# Patient Record
Sex: Male | Born: 1965
Health system: Southern US, Community
[De-identification: ages and names within clinical notes are randomized; demographics above are authoritative.]

## PROBLEM LIST (undated history)

## (undated) DIAGNOSIS — F32A Depression, unspecified: Secondary | ICD-10-CM

## (undated) DIAGNOSIS — T7840XA Allergy, unspecified, initial encounter: Secondary | ICD-10-CM

## (undated) DIAGNOSIS — F329 Major depressive disorder, single episode, unspecified: Secondary | ICD-10-CM

## (undated) DIAGNOSIS — R3129 Other microscopic hematuria: Secondary | ICD-10-CM

## (undated) DIAGNOSIS — K219 Gastro-esophageal reflux disease without esophagitis: Secondary | ICD-10-CM

## (undated) DIAGNOSIS — K264 Chronic or unspecified duodenal ulcer with hemorrhage: Secondary | ICD-10-CM

## (undated) DIAGNOSIS — F419 Anxiety disorder, unspecified: Secondary | ICD-10-CM

## (undated) DIAGNOSIS — E785 Hyperlipidemia, unspecified: Secondary | ICD-10-CM

## (undated) DIAGNOSIS — M542 Cervicalgia: Secondary | ICD-10-CM

## (undated) DIAGNOSIS — I10 Essential (primary) hypertension: Secondary | ICD-10-CM

## (undated) DIAGNOSIS — G8929 Other chronic pain: Secondary | ICD-10-CM

## (undated) HISTORY — DX: Other microscopic hematuria: R31.29

## (undated) HISTORY — DX: Depression, unspecified: F32.A

## (undated) HISTORY — DX: Cervicalgia: M54.2

## (undated) HISTORY — DX: Anxiety disorder, unspecified: F41.9

## (undated) HISTORY — DX: Major depressive disorder, single episode, unspecified: F32.9

## (undated) HISTORY — DX: Other chronic pain: G89.29

## (undated) HISTORY — PX: FLEXIBLE SIGMOIDOSCOPY: SHX1649

## (undated) HISTORY — DX: Allergy, unspecified, initial encounter: T78.40XA

## (undated) HISTORY — DX: Chronic or unspecified duodenal ulcer with hemorrhage: K26.4

## (undated) HISTORY — DX: Hyperlipidemia, unspecified: E78.5

## (undated) HISTORY — DX: Essential (primary) hypertension: I10

## (undated) HISTORY — PX: OTHER SURGICAL HISTORY: SHX169

---

## 2010-11-01 DIAGNOSIS — K264 Chronic or unspecified duodenal ulcer with hemorrhage: Secondary | ICD-10-CM

## 2010-11-01 HISTORY — PX: OTHER SURGICAL HISTORY: SHX169

## 2010-11-01 HISTORY — DX: Chronic or unspecified duodenal ulcer with hemorrhage: K26.4

## 2010-11-29 ENCOUNTER — Inpatient Hospital Stay: Payer: Self-pay | Admitting: Internal Medicine

## 2010-12-04 LAB — PATHOLOGY REPORT

## 2011-05-30 ENCOUNTER — Emergency Department: Payer: Self-pay | Admitting: Emergency Medicine

## 2013-10-08 ENCOUNTER — Emergency Department: Payer: Self-pay | Admitting: Emergency Medicine

## 2015-04-09 ENCOUNTER — Ambulatory Visit (INDEPENDENT_AMBULATORY_CARE_PROVIDER_SITE_OTHER): Payer: 59 | Admitting: Unknown Physician Specialty

## 2015-04-09 ENCOUNTER — Encounter: Payer: Self-pay | Admitting: Unknown Physician Specialty

## 2015-04-09 VITALS — BP 128/82 | HR 75 | Temp 98.5°F | Ht 66.5 in | Wt 186.2 lb

## 2015-04-09 DIAGNOSIS — M542 Cervicalgia: Secondary | ICD-10-CM

## 2015-04-09 DIAGNOSIS — I10 Essential (primary) hypertension: Secondary | ICD-10-CM

## 2015-04-09 DIAGNOSIS — E785 Hyperlipidemia, unspecified: Secondary | ICD-10-CM | POA: Diagnosis not present

## 2015-04-09 DIAGNOSIS — G8929 Other chronic pain: Secondary | ICD-10-CM

## 2015-04-09 MED ORDER — BUSPIRONE HCL 30 MG PO TABS: 30.0000 mg | ORAL_TABLET | Freq: Two times a day (BID) | ORAL | Status: DC

## 2015-04-09 MED ORDER — HYDROCODONE-ACETAMINOPHEN 5-325 MG PO TABS: 1.0000 | ORAL_TABLET | Freq: Every day | ORAL | Status: DC | PRN

## 2015-04-09 MED ORDER — BACLOFEN 10 MG PO TABS: 10.0000 mg | ORAL_TABLET | Freq: Two times a day (BID) | ORAL | Status: DC | PRN

## 2015-04-09 MED ORDER — DULOXETINE HCL 30 MG PO CPEP: 30.0000 mg | ORAL_CAPSULE | Freq: Two times a day (BID) | ORAL | Status: DC

## 2015-04-09 MED ORDER — FENOFIBRATE 160 MG PO TABS: 160.0000 mg | ORAL_TABLET | Freq: Every day | ORAL | Status: DC

## 2015-04-09 MED ORDER — ATORVASTATIN CALCIUM 40 MG PO TABS: 40.0000 mg | ORAL_TABLET | Freq: Every day | ORAL | Status: DC

## 2015-04-09 MED ORDER — ATENOLOL 50 MG PO TABS: 50.0000 mg | ORAL_TABLET | Freq: Every day | ORAL | Status: DC

## 2015-04-09 MED ORDER — ONDANSETRON HCL 8 MG PO TABS: 8.0000 mg | ORAL_TABLET | Freq: Two times a day (BID) | ORAL | Status: DC

## 2015-04-09 MED ORDER — CYCLOBENZAPRINE HCL 10 MG PO TABS: 10.0000 mg | ORAL_TABLET | Freq: Every day | ORAL | Status: DC
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
## 2015-06-17 ENCOUNTER — Other Ambulatory Visit: Payer: Self-pay

## 2015-06-17 NOTE — Telephone Encounter (Signed)
Opened in error

## 2015-06-18 ENCOUNTER — Telehealth: Payer: Self-pay

## 2015-06-18 NOTE — Telephone Encounter (Signed)
Called patient because I got a refill request from [facility] in [place] stating patient needs a refill on duloxetine. This medication was refilled on 04/09/15 for a 6 month supply but it was [facility] on [address]. So I called the patient to find out if he picked up the medication on [address]. Asked for patient to return my call and told him that he could leave the answer on my voicemail if I was unable to answer the phone.

## 2015-06-20 NOTE — Telephone Encounter (Signed)
Patient returned my call and stated that he picked up his rx at [facility] on [address] so everything is fine.

## 2015-07-30 ENCOUNTER — Encounter: Payer: Self-pay | Admitting: Unknown Physician Specialty

## 2015-07-30 ENCOUNTER — Ambulatory Visit (INDEPENDENT_AMBULATORY_CARE_PROVIDER_SITE_OTHER): Payer: 59 | Admitting: Unknown Physician Specialty

## 2015-07-30 VITALS — BP 101/64 | HR 71 | Temp 98.2°F | Ht 67.5 in | Wt 183.4 lb

## 2015-07-30 DIAGNOSIS — N189 Chronic kidney disease, unspecified: Secondary | ICD-10-CM

## 2015-07-30 DIAGNOSIS — E785 Hyperlipidemia, unspecified: Secondary | ICD-10-CM

## 2015-07-30 DIAGNOSIS — I10 Essential (primary) hypertension: Secondary | ICD-10-CM | POA: Diagnosis not present

## 2015-07-30 DIAGNOSIS — G8929 Other chronic pain: Secondary | ICD-10-CM

## 2015-07-30 DIAGNOSIS — M542 Cervicalgia: Secondary | ICD-10-CM | POA: Diagnosis not present

## 2015-07-30 DIAGNOSIS — I129 Hypertensive chronic kidney disease with stage 1 through stage 4 chronic kidney disease, or unspecified chronic kidney disease: Secondary | ICD-10-CM

## 2015-07-30 LAB — LIPID PANEL PICCOLO, WAIVED
CHOLESTEROL PICCOLO, WAIVED: 169 mg/dL (ref <200)
Chol/HDL Ratio Piccolo,Waive: 5.1 mg/dL — ABNORMAL HIGH
HDL CHOL PICCOLO, WAIVED: 33 mg/dL — AB (ref >59)
LDL Chol Calc Piccolo Waived: 78 mg/dL (ref <100)
TRIGLYCERIDES PICCOLO,WAIVED: 288 mg/dL — AB (ref <150)
VLDL Chol Calc Piccolo,Waive: 58 mg/dL — ABNORMAL HIGH (ref <30)

## 2015-07-30 LAB — MICROALBUMIN, URINE WAIVED
CREATININE, URINE WAIVED: 300 mg/dL (ref 10–300)
Microalb, Ur Waived: 30 mg/L — ABNORMAL HIGH (ref 0–19)

## 2015-07-30 MED ORDER — FENOFIBRATE 160 MG PO TABS: 160.0000 mg | ORAL_TABLET | Freq: Every day | ORAL | Status: DC

## 2015-07-30 MED ORDER — ATORVASTATIN CALCIUM 40 MG PO TABS: 40.0000 mg | ORAL_TABLET | Freq: Every day | ORAL | Status: DC

## 2015-07-30 MED ORDER — ONDANSETRON HCL 8 MG PO TABS: 8.0000 mg | ORAL_TABLET | Freq: Two times a day (BID) | ORAL | Status: DC

## 2015-07-30 MED ORDER — HYDROCODONE-ACETAMINOPHEN 5-325 MG PO TABS: 1.0000 | ORAL_TABLET | Freq: Every day | ORAL | Status: DC | PRN

## 2015-07-30 NOTE — Assessment & Plan Note (Addendum)
Reviewed lipid panel.  Stable, continue present medications.

## 2015-07-30 NOTE — Assessment & Plan Note (Addendum)
Stable, continue present medications.   Uses Hydrocodone sparingly.

## 2015-07-30 NOTE — Assessment & Plan Note (Addendum)
Stable.  microalbumin is 30.  CMP is pending.  Stopped NSAIDS and CKD is stable
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
## 2015-07-31 ENCOUNTER — Encounter: Payer: Self-pay | Admitting: Unknown Physician Specialty

## 2015-07-31 LAB — COMPREHENSIVE METABOLIC PANEL
ALBUMIN: 4.6 g/dL (ref 3.5–5.5)
ALT: 42 IU/L (ref 0–44)
AST: 33 IU/L (ref 0–40)
Albumin/Globulin Ratio: 2.3 (ref 1.1–2.5)
Alkaline Phosphatase: 50 IU/L (ref 39–117)
BUN/Creatinine Ratio: 8 — ABNORMAL LOW (ref 9–20)
BUN: 9 mg/dL (ref 6–24)
Bilirubin Total: 0.6 mg/dL (ref 0.0–1.2)
CALCIUM: 9.5 mg/dL (ref 8.7–10.2)
CO2: 24 mmol/L (ref 18–29)
CREATININE: 1.1 mg/dL (ref 0.76–1.27)
Chloride: 100 mmol/L (ref 97–108)
GFR, EST AFRICAN AMERICAN: 91 mL/min/{1.73_m2} (ref >59)
GFR, EST NON AFRICAN AMERICAN: 78 mL/min/{1.73_m2} (ref >59)
GLOBULIN, TOTAL: 2 g/dL (ref 1.5–4.5)
Glucose: 88 mg/dL (ref 65–99)
Potassium: 4.2 mmol/L (ref 3.5–5.2)
SODIUM: 140 mmol/L (ref 134–144)
TOTAL PROTEIN: 6.6 g/dL (ref 6.0–8.5)

## 2015-07-31 LAB — URIC ACID: Uric Acid: 5.6 mg/dL (ref 3.7–8.6)

## 2015-07-31 NOTE — Progress Notes (Signed)
Quick Note:  Normal labs as not fasting. Patient notified by letter. ______

## 2015-09-10 ENCOUNTER — Ambulatory Visit: Payer: 59 | Admitting: Unknown Physician Specialty

## 2015-11-18 ENCOUNTER — Encounter: Payer: Self-pay | Admitting: Unknown Physician Specialty

## 2015-11-18 ENCOUNTER — Ambulatory Visit (INDEPENDENT_AMBULATORY_CARE_PROVIDER_SITE_OTHER): Payer: 59 | Admitting: Unknown Physician Specialty

## 2015-11-18 VITALS — BP 127/71 | HR 78 | Temp 98.2°F | Ht 66.7 in | Wt 184.8 lb

## 2015-11-18 DIAGNOSIS — G8929 Other chronic pain: Secondary | ICD-10-CM | POA: Diagnosis not present

## 2015-11-18 DIAGNOSIS — E785 Hyperlipidemia, unspecified: Secondary | ICD-10-CM

## 2015-11-18 DIAGNOSIS — N189 Chronic kidney disease, unspecified: Secondary | ICD-10-CM

## 2015-11-18 DIAGNOSIS — M542 Cervicalgia: Secondary | ICD-10-CM | POA: Diagnosis not present

## 2015-11-18 DIAGNOSIS — I129 Hypertensive chronic kidney disease with stage 1 through stage 4 chronic kidney disease, or unspecified chronic kidney disease: Secondary | ICD-10-CM | POA: Diagnosis not present

## 2015-11-18 MED ORDER — HYDROCODONE-ACETAMINOPHEN 5-325 MG PO TABS: 1.0000 | ORAL_TABLET | Freq: Every day | ORAL | Status: DC | PRN

## 2015-11-18 MED ORDER — ATORVASTATIN CALCIUM 40 MG PO TABS: 40.0000 mg | ORAL_TABLET | Freq: Every day | ORAL | Status: DC

## 2015-11-18 MED ORDER — BACLOFEN 10 MG PO TABS: 10.0000 mg | ORAL_TABLET | Freq: Two times a day (BID) | ORAL | Status: DC | PRN

## 2015-11-18 MED ORDER — FENOFIBRATE 160 MG PO TABS: 160.0000 mg | ORAL_TABLET | Freq: Every day | ORAL | Status: DC

## 2015-11-18 MED ORDER — ONDANSETRON HCL 8 MG PO TABS: 8.0000 mg | ORAL_TABLET | Freq: Two times a day (BID) | ORAL | Status: DC

## 2015-11-18 MED ORDER — BUSPIRONE HCL 30 MG PO TABS: 30.0000 mg | ORAL_TABLET | Freq: Two times a day (BID) | ORAL | Status: DC

## 2015-11-18 MED ORDER — ATENOLOL 50 MG PO TABS: 50.0000 mg | ORAL_TABLET | Freq: Every day | ORAL | Status: DC

## 2015-11-18 MED ORDER — DULOXETINE HCL 30 MG PO CPEP
30.0000 mg | ORAL_CAPSULE | Freq: Two times a day (BID) | ORAL | Status: DC
Start: 2015-11-18 — End: 2016-05-12

## 2015-11-18 NOTE — Progress Notes (Signed)
BP 127/71 mmHg  Pulse 78  Temp(Src) 98.2 F (36.8 C)  Ht 5' 6.7" (1.694 m)  Wt 184 lb 12.8 oz (83.825 kg)  BMI 29.21 kg/m2  SpO2 98%   Subjective:    Patient ID: Jose Washington, male    DOB: Feb 07, 1966, 50 y.o.   MRN: [id number]  HPI: Jose Washington is a 50 y.o. male  Chief Complaint  Patient presents with  . Hyperlipidemia  . Hypertension  . Depression  . Medication Refill    pt states he needs refill on baclofen and zofran sent to [facility], and Duloxetine, atenolol, fenofibrate, atrovastatin, buspar, and hydrocodone sent to [facility].    Hypertension Using medications without difficulty  No problems or lightheadedness No chest pain with exertion or shortness of breath No Edema   Hyperlipidemia Using medications without problems: No Muscle aches  Diet compliance good Exercise:"I need to do more"  Depression/anxiety Doing well.  Cymbalta and Buspar combination is the best he has tried.  Doesn't always take the Buspar BID Depression screen [facility] 2/9 11/18/2015  Decreased Interest 0  Down, Depressed, Hopeless 0  PHQ - 2 Score 0    Neck pain Stable and actually improving.  Takes 1 Hydrocodone a week    Relevant past medical, surgical, family and social history reviewed and updated as indicated. Interim medical history since our last visit reviewed. Allergies and medications reviewed and updated.  Review of Systems  Per HPI unless specifically indicated above     Objective:    BP 127/71 mmHg  Pulse 78  Temp(Src) 98.2 F (36.8 C)  Ht 5' 6.7" (1.694 m)  Wt 184 lb 12.8 oz (83.825 kg)  BMI 29.21 kg/m2  SpO2 98%  Wt Readings from Last 3 Encounters:  11/18/15 184 lb 12.8 oz (83.825 kg)  07/30/15 183 lb 6.4 oz (83.19 kg)  04/09/15 186 lb 3.2 oz (84.46 kg)    Physical Exam  Constitutional: He is oriented to person, place, and time. He appears well-developed and well-nourished. No distress.  HENT:  Head: Normocephalic and  atraumatic.  Eyes: Conjunctivae and lids are normal. Right eye exhibits no discharge. Left eye exhibits no discharge. No scleral icterus.  Neck: Normal range of motion. Neck supple. No JVD present. Carotid bruit is not present.  Cardiovascular: Normal rate, regular rhythm and normal heart sounds.   Pulmonary/Chest: Effort normal and breath sounds normal. No respiratory distress.  Abdominal: Normal appearance. There is no splenomegaly or hepatomegaly.  Musculoskeletal: Normal range of motion.  Neurological: He is alert and oriented to person, place, and time.  Skin: Skin is warm, dry and intact. No rash noted. No pallor.  Psychiatric: He has a normal mood and affect. His behavior is normal. Judgment and thought content normal.    Results for orders placed or performed in visit on 07/30/15  Microalbumin, Urine Waived  Result Value Ref Range   Microalb, Ur Waived 30 (H) 0 - 19 mg/L   Creatinine, Urine Waived 300 10 - 300 mg/dL   Microalb/Creat Ratio <30 <30 mg/g  Uric acid  Result Value Ref Range   Uric Acid 5.6 3.7 - 8.6 mg/dL  Lipid Panel Piccolo, Waived  Result Value Ref Range   Cholesterol Piccolo, Waived 169 <200 mg/dL   HDL Chol Piccolo, Waived 33 (L) >59 mg/dL   Triglycerides Piccolo,Waived 288 (H) <150 mg/dL   Chol/HDL Ratio Piccolo,Waive 5.1 (H) mg/dL   LDL Chol Calc Piccolo Waived 78 <100 mg/dL  VLDL Chol Calc Piccolo,Waive 58 (H) <30 mg/dL  Comprehensive metabolic panel  Result Value Ref Range   Glucose 88 65 - 99 mg/dL   BUN 9 6 - 24 mg/dL   Creatinine, Ser 1.10 0.76 - 1.27 mg/dL   GFR calc non Af Amer 78 >59 mL/min/1.73   GFR calc Af Amer 91 >59 mL/min/1.73   BUN/Creatinine Ratio 8 (L) 9 - 20   Sodium 140 134 - 144 mmol/L   Potassium 4.2 3.5 - 5.2 mmol/L   Chloride 100 97 - 108 mmol/L   CO2 24 18 - 29 mmol/L   Calcium 9.5 8.7 - 10.2 mg/dL   Total Protein 6.6 6.0 - 8.5 g/dL   Albumin 4.6 3.5 - 5.5 g/dL   Globulin, Total 2.0 1.5 - 4.5 g/dL   Albumin/Globulin  Ratio 2.3 1.1 - 2.5   Bilirubin Total 0.6 0.0 - 1.2 mg/dL   Alkaline Phosphatase 50 39 - 117 IU/L   AST 33 0 - 40 IU/L   ALT 42 0 - 44 IU/L      Assessment & Plan:   Problem List Items Addressed This Visit      Unprioritized   Chronic neck pain   Relevant Medications   acetaminophen (TYLENOL) 500 MG tablet   DULoxetine (CYMBALTA) 30 MG capsule   baclofen (LIORESAL) 10 MG tablet   HYDROcodone-acetaminophen (NORCO/VICODIN) 5-325 MG tablet   Hyperlipidemia   Relevant Medications   atenolol (TENORMIN) 50 MG tablet   atorvastatin (LIPITOR) 40 MG tablet   fenofibrate 160 MG tablet   Benign hypertension with chronic kidney disease - Primary   Relevant Medications   atenolol (TENORMIN) 50 MG tablet   atorvastatin (LIPITOR) 40 MG tablet   fenofibrate 160 MG tablet      Diagnosis stable.  Continue present medication  Follow up plan: Return in about 3 months (around 02/16/2016).

## 2015-11-19 ENCOUNTER — Ambulatory Visit: Payer: 59 | Admitting: Unknown Physician Specialty

## 2015-12-14 ENCOUNTER — Other Ambulatory Visit: Payer: Self-pay | Admitting: Unknown Physician Specialty

## 2016-02-18 ENCOUNTER — Encounter: Payer: Self-pay | Admitting: Unknown Physician Specialty

## 2016-02-18 ENCOUNTER — Ambulatory Visit (INDEPENDENT_AMBULATORY_CARE_PROVIDER_SITE_OTHER): Payer: 59 | Admitting: Unknown Physician Specialty

## 2016-02-18 VITALS — BP 106/70 | HR 68 | Temp 98.0°F | Ht 66.5 in | Wt 181.0 lb

## 2016-02-18 DIAGNOSIS — M542 Cervicalgia: Secondary | ICD-10-CM

## 2016-02-18 DIAGNOSIS — N189 Chronic kidney disease, unspecified: Secondary | ICD-10-CM

## 2016-02-18 DIAGNOSIS — I129 Hypertensive chronic kidney disease with stage 1 through stage 4 chronic kidney disease, or unspecified chronic kidney disease: Secondary | ICD-10-CM | POA: Diagnosis not present

## 2016-02-18 DIAGNOSIS — G8929 Other chronic pain: Secondary | ICD-10-CM

## 2016-02-18 DIAGNOSIS — E785 Hyperlipidemia, unspecified: Secondary | ICD-10-CM | POA: Diagnosis not present

## 2016-02-18 DIAGNOSIS — Z0000 Encounter for general adult medical examination without abnormal findings: Secondary | ICD-10-CM | POA: Diagnosis not present

## 2016-02-18 MED ORDER — HYDROCODONE-ACETAMINOPHEN 5-325 MG PO TABS: 1.0000 | ORAL_TABLET | Freq: Every day | ORAL | Status: DC | PRN

## 2016-02-18 NOTE — Progress Notes (Signed)
BP 106/70 mmHg  Pulse 68  Temp(Src) 98 F (36.7 C)  Ht 5' 6.5" (1.689 m)  Wt 181 lb (82.101 kg)  BMI 28.78 kg/m2  SpO2 97%   Subjective:    Patient ID: Laural Washington, male    DOB: 1965-12-11, 50 y.o.   MRN: [id number]  HPI: Jose Washington is a 50 y.o. male  Chief Complaint  Patient presents with  . Annual Exam   Depression screen [facility] 2/9 02/18/2016 11/18/2015  Decreased Interest 0 0  Down, Depressed, Hopeless 0 0  PHQ - 2 Score 0 0    Social History   Social History  . Marital Status: Single    Spouse Name: N/A  . Number of Children: N/A  . Years of Education: N/A   Occupational History  . Not on file.   Social History Main Topics  . Smoking status: Never Smoker   . Smokeless tobacco: Never Used  . Alcohol Use: No  . Drug Use: No  . Sexual Activity: No   Other Topics Concern  . Not on file   Social History Narrative   Family History  Problem Relation Age of Onset  . Diabetes Mother   . Cancer Mother     breast- in remission  . Mental illness Father   . Depression Father   . Dementia Father     VASCULAR  . Allergies Brother   . Cancer Maternal Grandfather     colon  . Stroke Paternal Grandfather   . Dementia Paternal Grandfather     vascular    Hypertension Using medications without difficulty Average home BPs 107/70  No problems or lightheadedness No chest pain with exertion or shortness of breath No Edema   Hyperlipidemia Using medications without problems: No Muscle aches besides neck Diet compliance: Decreased sugar and processed food but can do more.   Exercise: 2 days/week   Chronic pain Taking Vicoden once or twice a week.  #30 prescribed in January.  He would like a refill  Relevant past medical, surgical, family and social history reviewed and updated as indicated. Interim medical history since our last visit reviewed. Allergies and medications reviewed and updated.  Review of Systems  Constitutional: Negative.    HENT: Negative.   Eyes: Negative.   Respiratory: Negative.   Cardiovascular: Negative.   Gastrointestinal: Negative.   Endocrine: Negative.   Genitourinary: Negative.   Skin: Negative.   Allergic/Immunologic: Negative.   Neurological: Negative.   Hematological: Negative.   Psychiatric/Behavioral: Negative.     Per HPI unless specifically indicated above     Objective:    BP 106/70 mmHg  Pulse 68  Temp(Src) 98 F (36.7 C)  Ht 5' 6.5" (1.689 m)  Wt 181 lb (82.101 kg)  BMI 28.78 kg/m2  SpO2 97%  Wt Readings from Last 3 Encounters:  02/18/16 181 lb (82.101 kg)  11/18/15 184 lb 12.8 oz (83.825 kg)  07/30/15 183 lb 6.4 oz (83.19 kg)    Physical Exam  Constitutional: He is oriented to person, place, and time. He appears well-developed and well-nourished.  HENT:  Head: Normocephalic.  Right Ear: Tympanic membrane, external ear and ear canal normal.  Left Ear: Tympanic membrane, external ear and ear canal normal.  Mouth/Throat: Uvula is midline, oropharynx is clear and moist and mucous membranes are normal.  Eyes: Pupils are equal, round, and reactive to light.  Cardiovascular: Normal rate, regular rhythm and normal heart sounds.  Exam reveals no gallop and no friction rub.  No murmur heard. Pulmonary/Chest: Effort normal and breath sounds normal. No respiratory distress.  Abdominal: Soft. Bowel sounds are normal. He exhibits no distension. There is no tenderness.  Musculoskeletal: Normal range of motion.  Neurological: He is alert and oriented to person, place, and time. He has normal reflexes.  Skin: Skin is warm and dry.  Psychiatric: He has a normal mood and affect. His behavior is normal. Judgment and thought content normal.    Results for orders placed or performed in visit on 07/30/15  Microalbumin, Urine Waived  Result Value Ref Range   Microalb, Ur Waived 30 (H) 0 - 19 mg/L   Creatinine, Urine Waived 300 10 - 300 mg/dL   Microalb/Creat Ratio <30 <30 mg/g   Uric acid  Result Value Ref Range   Uric Acid 5.6 3.7 - 8.6 mg/dL  Lipid Panel Piccolo, Waived  Result Value Ref Range   Cholesterol Piccolo, Waived 169 <200 mg/dL   HDL Chol Piccolo, Waived 33 (L) >59 mg/dL   Triglycerides Piccolo,Waived 288 (H) <150 mg/dL   Chol/HDL Ratio Piccolo,Waive 5.1 (H) mg/dL   LDL Chol Calc Piccolo Waived 78 <100 mg/dL   VLDL Chol Calc Piccolo,Waive 58 (H) <30 mg/dL  Comprehensive metabolic panel  Result Value Ref Range   Glucose 88 65 - 99 mg/dL   BUN 9 6 - 24 mg/dL   Creatinine, Ser 1.10 0.76 - 1.27 mg/dL   GFR calc non Af Amer 78 >59 mL/min/1.73   GFR calc Af Amer 91 >59 mL/min/1.73   BUN/Creatinine Ratio 8 (L) 9 - 20   Sodium 140 134 - 144 mmol/L   Potassium 4.2 3.5 - 5.2 mmol/L   Chloride 100 97 - 108 mmol/L   CO2 24 18 - 29 mmol/L   Calcium 9.5 8.7 - 10.2 mg/dL   Total Protein 6.6 6.0 - 8.5 g/dL   Albumin 4.6 3.5 - 5.5 g/dL   Globulin, Total 2.0 1.5 - 4.5 g/dL   Albumin/Globulin Ratio 2.3 1.1 - 2.5   Bilirubin Total 0.6 0.0 - 1.2 mg/dL   Alkaline Phosphatase 50 39 - 117 IU/L   AST 33 0 - 40 IU/L   ALT 42 0 - 44 IU/L      Assessment & Plan:   Problem List Items Addressed This Visit      Unprioritized   Benign hypertension with chronic kidney disease   Relevant Orders   Comprehensive metabolic panel   Chronic neck pain   Relevant Medications   HYDROcodone-acetaminophen (NORCO/VICODIN) 5-325 MG tablet   Hyperlipidemia   Relevant Orders   Lipid Panel w/o Chol/HDL Ratio    Other Visit Diagnoses    Routine general medical examination at a health care facility    -  Primary    Relevant Orders    CBC with Differential/Platelet    TSH        Follow up plan: No Follow-up on file.

## 2016-02-19 LAB — COMPREHENSIVE METABOLIC PANEL
ALBUMIN: 4.7 g/dL (ref 3.5–5.5)
ALT: 53 IU/L — ABNORMAL HIGH (ref 0–44)
AST: 37 IU/L (ref 0–40)
Albumin/Globulin Ratio: 1.8 (ref 1.2–2.2)
Alkaline Phosphatase: 61 IU/L (ref 39–117)
BILIRUBIN TOTAL: 0.5 mg/dL (ref 0.0–1.2)
BUN / CREAT RATIO: 7 — AB (ref 9–20)
BUN: 9 mg/dL (ref 6–24)
CHLORIDE: 101 mmol/L (ref 96–106)
CO2: 23 mmol/L (ref 18–29)
CREATININE: 1.29 mg/dL — AB (ref 0.76–1.27)
Calcium: 10 mg/dL (ref 8.7–10.2)
GFR calc non Af Amer: 65 mL/min/{1.73_m2} (ref >59)
GFR, EST AFRICAN AMERICAN: 75 mL/min/{1.73_m2} (ref >59)
GLUCOSE: 95 mg/dL (ref 65–99)
Globulin, Total: 2.6 g/dL (ref 1.5–4.5)
Potassium: 4.5 mmol/L (ref 3.5–5.2)
Sodium: 142 mmol/L (ref 134–144)
TOTAL PROTEIN: 7.3 g/dL (ref 6.0–8.5)

## 2016-02-19 LAB — CBC WITH DIFFERENTIAL/PLATELET
BASOS ABS: 0.1 10*3/uL (ref 0.0–0.2)
BASOS: 1 %
EOS (ABSOLUTE): 0.4 10*3/uL (ref 0.0–0.4)
EOS: 6 %
HEMATOCRIT: 45.3 % (ref 37.5–51.0)
HEMOGLOBIN: 15.5 g/dL (ref 12.6–17.7)
IMMATURE GRANS (ABS): 0 10*3/uL (ref 0.0–0.1)
Immature Granulocytes: 0 %
LYMPHS: 25 %
Lymphocytes Absolute: 1.7 10*3/uL (ref 0.7–3.1)
MCH: 31 pg (ref 26.6–33.0)
MCHC: 34.2 g/dL (ref 31.5–35.7)
MCV: 91 fL (ref 79–97)
MONOCYTES: 10 %
Monocytes Absolute: 0.6 10*3/uL (ref 0.1–0.9)
NEUTROS ABS: 3.9 10*3/uL (ref 1.4–7.0)
Neutrophils: 58 %
Platelets: 405 10*3/uL — ABNORMAL HIGH (ref 150–379)
RBC: 5 x10E6/uL (ref 4.14–5.80)
RDW: 13.9 % (ref 12.3–15.4)
WBC: 6.7 10*3/uL (ref 3.4–10.8)

## 2016-02-19 LAB — LIPID PANEL W/O CHOL/HDL RATIO
Cholesterol, Total: 160 mg/dL (ref 100–199)
HDL: 40 mg/dL (ref >39)
LDL Calculated: 91 mg/dL (ref 0–99)
Triglycerides: 144 mg/dL (ref 0–149)
VLDL CHOLESTEROL CAL: 29 mg/dL (ref 5–40)

## 2016-02-19 LAB — TSH: TSH: 2.46 u[IU]/mL (ref 0.450–4.500)

## 2016-02-20 ENCOUNTER — Encounter: Payer: Self-pay | Admitting: Unknown Physician Specialty

## 2016-02-20 NOTE — Progress Notes (Signed)
Quick Note:  Normal labs. Patient notified by letter. ______ 

## 2016-05-12 ENCOUNTER — Encounter: Payer: Self-pay | Admitting: Unknown Physician Specialty

## 2016-05-12 ENCOUNTER — Ambulatory Visit (INDEPENDENT_AMBULATORY_CARE_PROVIDER_SITE_OTHER): Payer: 59 | Admitting: Unknown Physician Specialty

## 2016-05-12 VITALS — BP 122/72 | HR 73 | Temp 98.4°F | Ht 66.5 in | Wt 182.0 lb

## 2016-05-12 DIAGNOSIS — N189 Chronic kidney disease, unspecified: Secondary | ICD-10-CM | POA: Diagnosis not present

## 2016-05-12 DIAGNOSIS — M542 Cervicalgia: Secondary | ICD-10-CM

## 2016-05-12 DIAGNOSIS — I129 Hypertensive chronic kidney disease with stage 1 through stage 4 chronic kidney disease, or unspecified chronic kidney disease: Secondary | ICD-10-CM | POA: Diagnosis not present

## 2016-05-12 DIAGNOSIS — G8929 Other chronic pain: Secondary | ICD-10-CM | POA: Diagnosis not present

## 2016-05-12 DIAGNOSIS — K2970 Gastritis, unspecified, without bleeding: Secondary | ICD-10-CM | POA: Diagnosis not present

## 2016-05-12 MED ORDER — ATENOLOL 50 MG PO TABS: 50.0000 mg | ORAL_TABLET | Freq: Every day | ORAL | Status: DC

## 2016-05-12 MED ORDER — CYCLOBENZAPRINE HCL 10 MG PO TABS: 10.0000 mg | ORAL_TABLET | Freq: Every day | ORAL | Status: DC

## 2016-05-12 MED ORDER — HYDROCODONE-ACETAMINOPHEN 5-325 MG PO TABS: 1.0000 | ORAL_TABLET | Freq: Every day | ORAL | Status: DC | PRN

## 2016-05-12 MED ORDER — DULOXETINE HCL 30 MG PO CPEP: 30.0000 mg | ORAL_CAPSULE | Freq: Two times a day (BID) | ORAL | Status: DC

## 2016-05-12 MED ORDER — FENOFIBRATE 160 MG PO TABS: 160.0000 mg | ORAL_TABLET | Freq: Every day | ORAL | Status: DC

## 2016-05-12 MED ORDER — BACLOFEN 10 MG PO TABS: 10.0000 mg | ORAL_TABLET | Freq: Two times a day (BID) | ORAL | Status: DC | PRN

## 2016-05-12 MED ORDER — OMEPRAZOLE 20 MG PO CPDR: 20.0000 mg | DELAYED_RELEASE_CAPSULE | Freq: Every day | ORAL | Status: DC

## 2016-05-12 MED ORDER — ONDANSETRON HCL 8 MG PO TABS: 8.0000 mg | ORAL_TABLET | Freq: Two times a day (BID) | ORAL | Status: DC

## 2016-05-12 MED ORDER — BUSPIRONE HCL 30 MG PO TABS: 30.0000 mg | ORAL_TABLET | Freq: Three times a day (TID) | ORAL | Status: DC

## 2016-05-12 MED ORDER — ATORVASTATIN CALCIUM 40 MG PO TABS
40.0000 mg | ORAL_TABLET | Freq: Every day | ORAL | Status: DC
Start: 2016-05-12 — End: 2017-02-21

## 2016-05-12 NOTE — Assessment & Plan Note (Signed)
Pt taking Zofran.  Restart Omeprazole for further management and try to cut back on Zofran.

## 2016-05-12 NOTE — Assessment & Plan Note (Addendum)
Stable, continue present medications. I discussed that Hydrocodone even for occasional use will be limited in the near future.  Continue trying to taper

## 2016-05-12 NOTE — Assessment & Plan Note (Signed)
Stable, continue present medications.   

## 2016-05-12 NOTE — Progress Notes (Signed)
BP 122/72 mmHg  Pulse 73  Temp(Src) 98.4 F (36.9 C)  Ht 5' 6.5" (1.689 m)  Wt 182 lb (82.555 kg)  BMI 28.94 kg/m2  SpO2 98%   Subjective:    Patient ID: Jose Washington, male    DOB: [birth year], 50 y.o.   MRN: [id number]  HPI: Jose Washington is a 50 y.o. male  Chief Complaint  Patient presents with  . Medication Refill    pt states he needs all meds refilled for 90 day supplies. Zofran and baclofen need to go to [facility] and all others need to go to [facility]   Depression screen [facility] 2/9 02/18/2016 11/18/2015  Decreased Interest 0 0  Down, Depressed, Hopeless 0 0  PHQ - 2 Score 0 0    Social History   Social History  . Marital Status: Single    Spouse Name: N/A  . Number of Children: N/A  . Years of Education: N/A   Occupational History  . Not on file.   Social History Main Topics  . Smoking status: Never Smoker   . Smokeless tobacco: Never Used  . Alcohol Use: No  . Drug Use: No  . Sexual Activity: No   Other Topics Concern  . Not on file   Social History Narrative   Family History  Problem Relation Age of Onset  . Diabetes Mother   . Cancer Mother     breast- in remission  . Mental illness Father   . Depression Father   . Dementia Father     VASCULAR  . Allergies Brother   . Cancer Maternal Grandfather     colon  . Stroke Paternal Grandfather   . Dementia Paternal Grandfather     vascular    Hypertension Using medications without difficulty Average home BPs 107/70  No problems or lightheadedness No chest pain with exertion or shortness of breath No Edema   Chronic pain Taking Vicoden once or twice a week.  #30 prescribed in April. Pt states his neck is getting better through chiropractic care.  Taking Baclofen.    Nausea Takes Zofran for chronic nausea.  He states it is due to Gastritis with a history of ulcers.  He takes the Zofran daily.  He has been on Protonix and Pantoprazole in the past.     Relevant past medical, surgical,  family and social history reviewed and updated as indicated. Interim medical history since our last visit reviewed. Allergies and medications reviewed and updated.  Review of Systems  Constitutional: Negative.   HENT: Negative.   Eyes: Negative.   Respiratory: Negative.   Cardiovascular: Negative.   Gastrointestinal: Negative.   Endocrine: Negative.   Genitourinary: Negative.   Skin: Negative.   Allergic/Immunologic: Negative.   Neurological: Negative.   Hematological: Negative.   Psychiatric/Behavioral: Negative.     Per HPI unless specifically indicated above     Objective:    BP 122/72 mmHg  Pulse 73  Temp(Src) 98.4 F (36.9 C)  Ht 5' 6.5" (1.689 m)  Wt 182 lb (82.555 kg)  BMI 28.94 kg/m2  SpO2 98%  Wt Readings from Last 3 Encounters:  05/12/16 182 lb (82.555 kg)  02/18/16 181 lb (82.101 kg)  11/18/15 184 lb 12.8 oz (83.825 kg)    Physical Exam  Constitutional: He is oriented to person, place, and time. He appears well-developed and well-nourished.  HENT:  Head: Normocephalic.  Right Ear: Tympanic membrane, external ear and ear canal normal.  Left Ear: Tympanic membrane,  external ear and ear canal normal.  Mouth/Throat: Uvula is midline, oropharynx is clear and moist and mucous membranes are normal.  Eyes: Pupils are equal, round, and reactive to light.  Cardiovascular: Normal rate, regular rhythm and normal heart sounds.  Exam reveals no gallop and no friction rub.   No murmur heard. Pulmonary/Chest: Effort normal and breath sounds normal. No respiratory distress.  Abdominal: Soft. Bowel sounds are normal. He exhibits no distension. There is no tenderness.  Musculoskeletal: Normal range of motion.  Neurological: He is alert and oriented to person, place, and time. He has normal reflexes.  Skin: Skin is warm and dry.  Psychiatric: He has a normal mood and affect. His behavior is normal. Judgment and thought content normal.    Results for orders placed or  performed in visit on 02/18/16  CBC with Differential/Platelet  Result Value Ref Range   WBC 6.7 3.4 - 10.8 x10E3/uL   RBC 5.00 4.14 - 5.80 x10E6/uL   Hemoglobin 15.5 12.6 - 17.7 g/dL   Hematocrit 45.3 37.5 - 51.0 %   MCV 91 79 - 97 fL   MCH 31.0 26.6 - 33.0 pg   MCHC 34.2 31.5 - 35.7 g/dL   RDW 13.9 12.3 - 15.4 %   Platelets 405 (H) 150 - 379 x10E3/uL   Neutrophils 58 %   Lymphs 25 %   Monocytes 10 %   Eos 6 %   Basos 1 %   Neutrophils Absolute 3.9 1.4 - 7.0 x10E3/uL   Lymphocytes Absolute 1.7 0.7 - 3.1 x10E3/uL   Monocytes Absolute 0.6 0.1 - 0.9 x10E3/uL   EOS (ABSOLUTE) 0.4 0.0 - 0.4 x10E3/uL   Basophils Absolute 0.1 0.0 - 0.2 x10E3/uL   Immature Granulocytes 0 %   Immature Grans (Abs) 0.0 0.0 - 0.1 x10E3/uL  Comprehensive metabolic panel  Result Value Ref Range   Glucose 95 65 - 99 mg/dL   BUN 9 6 - 24 mg/dL   Creatinine, Ser 1.29 (H) 0.76 - 1.27 mg/dL   GFR calc non Af Amer 65 >59 mL/min/1.73   GFR calc Af Amer 75 >59 mL/min/1.73   BUN/Creatinine Ratio 7 (L) 9 - 20   Sodium 142 134 - 144 mmol/L   Potassium 4.5 3.5 - 5.2 mmol/L   Chloride 101 96 - 106 mmol/L   CO2 23 18 - 29 mmol/L   Calcium 10.0 8.7 - 10.2 mg/dL   Total Protein 7.3 6.0 - 8.5 g/dL   Albumin 4.7 3.5 - 5.5 g/dL   Globulin, Total 2.6 1.5 - 4.5 g/dL   Albumin/Globulin Ratio 1.8 1.2 - 2.2   Bilirubin Total 0.5 0.0 - 1.2 mg/dL   Alkaline Phosphatase 61 39 - 117 IU/L   AST 37 0 - 40 IU/L   ALT 53 (H) 0 - 44 IU/L  Lipid Panel w/o Chol/HDL Ratio  Result Value Ref Range   Cholesterol, Total 160 100 - 199 mg/dL   Triglycerides 144 0 - 149 mg/dL   HDL 40 >39 mg/dL   VLDL Cholesterol Cal 29 5 - 40 mg/dL   LDL Calculated 91 0 - 99 mg/dL  TSH  Result Value Ref Range   TSH 2.460 0.450 - 4.500 uIU/mL      Assessment & Plan:   Problem List Items Addressed This Visit      Unprioritized   Benign hypertension with chronic kidney disease    Stable, continue present medications.        Relevant  Medications   fenofibrate 160  MG tablet   atorvastatin (LIPITOR) 40 MG tablet   atenolol (TENORMIN) 50 MG tablet   Chronic neck pain    Stable, continue present medications. I discussed that Hydrocodone even for occasional use will be limited in the near future.  Continue trying to taper       Relevant Medications   baclofen (LIORESAL) 10 MG tablet   HYDROcodone-acetaminophen (NORCO/VICODIN) 5-325 MG tablet   DULoxetine (CYMBALTA) 30 MG capsule   cyclobenzaprine (FLEXERIL) 10 MG tablet   Gastritis - Primary    Pt taking Zofran.  Restart Omeprazole for further management and try to cut back on Zofran.            Follow up plan: Return in about 3 months (around 08/12/2016).

## 2016-07-12 ENCOUNTER — Telehealth: Payer: Self-pay | Admitting: Unknown Physician Specialty

## 2016-07-12 DIAGNOSIS — M542 Cervicalgia: Principal | ICD-10-CM

## 2016-07-12 DIAGNOSIS — G8929 Other chronic pain: Secondary | ICD-10-CM

## 2016-07-12 NOTE — Telephone Encounter (Signed)
Routing to provider  

## 2016-07-12 NOTE — Telephone Encounter (Signed)
Referral generated

## 2016-07-12 NOTE — Telephone Encounter (Signed)
Pt would like to get a referral to go to the pain clinic in regards to his neck pain.

## 2016-08-11 ENCOUNTER — Ambulatory Visit (INDEPENDENT_AMBULATORY_CARE_PROVIDER_SITE_OTHER): Payer: 59 | Admitting: Unknown Physician Specialty

## 2016-08-11 ENCOUNTER — Encounter: Payer: Self-pay | Admitting: Unknown Physician Specialty

## 2016-08-11 VITALS — BP 106/68 | HR 71 | Temp 98.5°F | Ht 66.7 in | Wt 179.2 lb

## 2016-08-11 DIAGNOSIS — Z0000 Encounter for general adult medical examination without abnormal findings: Secondary | ICD-10-CM | POA: Diagnosis not present

## 2016-08-11 DIAGNOSIS — M542 Cervicalgia: Secondary | ICD-10-CM | POA: Diagnosis not present

## 2016-08-11 DIAGNOSIS — I129 Hypertensive chronic kidney disease with stage 1 through stage 4 chronic kidney disease, or unspecified chronic kidney disease: Secondary | ICD-10-CM | POA: Diagnosis not present

## 2016-08-11 DIAGNOSIS — G8929 Other chronic pain: Secondary | ICD-10-CM | POA: Diagnosis not present

## 2016-08-11 DIAGNOSIS — K2970 Gastritis, unspecified, without bleeding: Secondary | ICD-10-CM | POA: Diagnosis not present

## 2016-08-11 MED ORDER — BUSPIRONE HCL 30 MG PO TABS: 30.0000 mg | ORAL_TABLET | Freq: Two times a day (BID) | ORAL | 1 refills | Status: DC

## 2016-08-11 MED ORDER — HYDROCODONE-ACETAMINOPHEN 5-325 MG PO TABS: 1.0000 | ORAL_TABLET | Freq: Every day | ORAL | 0 refills | Status: DC | PRN

## 2016-08-11 NOTE — Assessment & Plan Note (Signed)
Stable, continue present medications.   

## 2016-08-11 NOTE — Progress Notes (Signed)
BP 106/68 (BP Location: Left Arm, Patient Position: Sitting, Cuff Size: Large)   Pulse 71   Temp 98.5 F (36.9 C)   Ht 5' 6.7" (1.694 m)   Wt 179 lb 3.2 oz (81.3 kg)   SpO2 98%   BMI 28.32 kg/m    Subjective:    Patient ID: Jose Washington, male    DOB: April 24, 1966, 50 y.o.   MRN: [id number]  HPI: Jose Washington is a 50 y.o. male  Chief Complaint  Patient presents with  . Hypertension  . Depression  . Gastroesophageal Reflux   Hypertension Using medications without difficulty Average home BPs 107/70      No problems or lightheadedness No chest pain with exertion or shortness of breath No Edema  Hyperlipidemia Using medications without problems: No Muscle aches besides neck Diet compliance: Decreased sugar and processed food but can do more.   Exercise: 2 days/week   Chronic pain Taking Vicoden once or twice a week.  #30 prescribed in January.  He would like a refill.  Made appt at a pain clinic and due in December  GERD Omeprazole causes constipation and takes it once every 3 days.  Takes Zofran instead and primary symptom is nausea.  Depression   Depression screen [facility] 2/9 08/11/2016 02/18/2016 11/18/2015  Decreased Interest 0 0 0  Down, Depressed, Hopeless 0 0 0  PHQ - 2 Score 0 0 0  Altered sleeping 0 - -  Tired, decreased energy 0 - -  Change in appetite 0 - -  Feeling bad or failure about yourself  0 - -  Trouble concentrating 0 - -  Moving slowly or fidgety/restless 0 - -  Suicidal thoughts 0 - -  PHQ-9 Score 0 - -     Relevant past medical, surgical, family and social history reviewed and updated as indicated. Interim medical history since our last visit reviewed. Allergies and medications reviewed and updated.  Review of Systems  Per HPI unless specifically indicated above     Objective:    BP 106/68 (BP Location: Left Arm, Patient Position: Sitting, Cuff Size: Large)   Pulse 71   Temp 98.5 F (36.9 C)   Ht 5' 6.7" (1.694 m)   Wt  179 lb 3.2 oz (81.3 kg)   SpO2 98%   BMI 28.32 kg/m   Wt Readings from Last 3 Encounters:  08/11/16 179 lb 3.2 oz (81.3 kg)  05/12/16 182 lb (82.6 kg)  02/18/16 181 lb (82.1 kg)    Physical Exam  Constitutional: He is oriented to person, place, and time. He appears well-developed and well-nourished. No distress.  HENT:  Head: Normocephalic and atraumatic.  Eyes: Conjunctivae and lids are normal. Right eye exhibits no discharge. Left eye exhibits no discharge. No scleral icterus.  Neck: Normal range of motion. Neck supple. No JVD present. Carotid bruit is not present.  Cardiovascular: Normal rate, regular rhythm and normal heart sounds.   Pulmonary/Chest: Effort normal and breath sounds normal. No respiratory distress.  Abdominal: Normal appearance. There is no splenomegaly or hepatomegaly.  Musculoskeletal: Normal range of motion.  Neurological: He is alert and oriented to person, place, and time.  Skin: Skin is warm, dry and intact. No rash noted. No pallor.  Psychiatric: He has a normal mood and affect. His behavior is normal. Judgment and thought content normal.  Nursing note and vitals reviewed.   Results for orders placed or performed in visit on 02/18/16  CBC with Differential/Platelet  Result Value Ref  Range   WBC 6.7 3.4 - 10.8 x10E3/uL   RBC 5.00 4.14 - 5.80 x10E6/uL   Hemoglobin 15.5 12.6 - 17.7 g/dL   Hematocrit 45.3 37.5 - 51.0 %   MCV 91 79 - 97 fL   MCH 31.0 26.6 - 33.0 pg   MCHC 34.2 31.5 - 35.7 g/dL   RDW 13.9 12.3 - 15.4 %   Platelets 405 (H) 150 - 379 x10E3/uL   Neutrophils 58 %   Lymphs 25 %   Monocytes 10 %   Eos 6 %   Basos 1 %   Neutrophils Absolute 3.9 1.4 - 7.0 x10E3/uL   Lymphocytes Absolute 1.7 0.7 - 3.1 x10E3/uL   Monocytes Absolute 0.6 0.1 - 0.9 x10E3/uL   EOS (ABSOLUTE) 0.4 0.0 - 0.4 x10E3/uL   Basophils Absolute 0.1 0.0 - 0.2 x10E3/uL   Immature Granulocytes 0 %   Immature Grans (Abs) 0.0 0.0 - 0.1 x10E3/uL  Comprehensive metabolic panel    Result Value Ref Range   Glucose 95 65 - 99 mg/dL   BUN 9 6 - 24 mg/dL   Creatinine, Ser 1.29 (H) 0.76 - 1.27 mg/dL   GFR calc non Af Amer 65 >59 mL/min/1.73   GFR calc Af Amer 75 >59 mL/min/1.73   BUN/Creatinine Ratio 7 (L) 9 - 20   Sodium 142 134 - 144 mmol/L   Potassium 4.5 3.5 - 5.2 mmol/L   Chloride 101 96 - 106 mmol/L   CO2 23 18 - 29 mmol/L   Calcium 10.0 8.7 - 10.2 mg/dL   Total Protein 7.3 6.0 - 8.5 g/dL   Albumin 4.7 3.5 - 5.5 g/dL   Globulin, Total 2.6 1.5 - 4.5 g/dL   Albumin/Globulin Ratio 1.8 1.2 - 2.2   Bilirubin Total 0.5 0.0 - 1.2 mg/dL   Alkaline Phosphatase 61 39 - 117 IU/L   AST 37 0 - 40 IU/L   ALT 53 (H) 0 - 44 IU/L  Lipid Panel w/o Chol/HDL Ratio  Result Value Ref Range   Cholesterol, Total 160 100 - 199 mg/dL   Triglycerides 144 0 - 149 mg/dL   HDL 40 >39 mg/dL   VLDL Cholesterol Cal 29 5 - 40 mg/dL   LDL Calculated 91 0 - 99 mg/dL  TSH  Result Value Ref Range   TSH 2.460 0.450 - 4.500 uIU/mL  -    Assessment & Plan:   Problem List Items Addressed This Visit      Unprioritized   Benign hypertension with chronic kidney disease    Stable, continue present medications.        Chronic neck pain    Refilled Hydrocodone.  Will be seen at pain clinic      Relevant Medications   HYDROcodone-acetaminophen (NORCO/VICODIN) 5-325 MG tablet   Gastritis    Stable, continue present medications.         Other Visit Diagnoses    Routine general medical examination at a health care facility    -  Primary   Relevant Orders   Cologuard       Follow up plan: Return in about 6 months (around 02/09/2017).

## 2016-08-11 NOTE — Assessment & Plan Note (Addendum)
Refilled Hydrocodone.  Will be seen at pain clinic

## 2016-10-21 ENCOUNTER — Ambulatory Visit: Payer: 59 | Admitting: Pain Medicine

## 2016-11-03 ENCOUNTER — Ambulatory Visit: Payer: 59 | Attending: Pain Medicine | Admitting: Pain Medicine

## 2016-11-03 ENCOUNTER — Encounter: Payer: Self-pay | Admitting: Pain Medicine

## 2016-11-03 VITALS — BP 142/95 | HR 120 | Temp 98.7°F | Resp 18 | Ht 66.0 in | Wt 184.0 lb

## 2016-11-03 DIAGNOSIS — K2970 Gastritis, unspecified, without bleeding: Secondary | ICD-10-CM | POA: Diagnosis not present

## 2016-11-03 DIAGNOSIS — M19011 Primary osteoarthritis, right shoulder: Secondary | ICD-10-CM | POA: Diagnosis not present

## 2016-11-03 DIAGNOSIS — F1190 Opioid use, unspecified, uncomplicated: Secondary | ICD-10-CM | POA: Insufficient documentation

## 2016-11-03 DIAGNOSIS — Z79899 Other long term (current) drug therapy: Secondary | ICD-10-CM | POA: Diagnosis not present

## 2016-11-03 DIAGNOSIS — M533 Sacrococcygeal disorders, not elsewhere classified: Secondary | ICD-10-CM

## 2016-11-03 DIAGNOSIS — M545 Low back pain: Secondary | ICD-10-CM

## 2016-11-03 DIAGNOSIS — Z79891 Long term (current) use of opiate analgesic: Secondary | ICD-10-CM | POA: Diagnosis not present

## 2016-11-03 DIAGNOSIS — G8929 Other chronic pain: Secondary | ICD-10-CM | POA: Insufficient documentation

## 2016-11-03 DIAGNOSIS — I129 Hypertensive chronic kidney disease with stage 1 through stage 4 chronic kidney disease, or unspecified chronic kidney disease: Secondary | ICD-10-CM | POA: Insufficient documentation

## 2016-11-03 DIAGNOSIS — M549 Dorsalgia, unspecified: Secondary | ICD-10-CM

## 2016-11-03 DIAGNOSIS — Z5189 Encounter for other specified aftercare: Secondary | ICD-10-CM | POA: Diagnosis not present

## 2016-11-03 DIAGNOSIS — E785 Hyperlipidemia, unspecified: Secondary | ICD-10-CM | POA: Diagnosis not present

## 2016-11-03 DIAGNOSIS — N189 Chronic kidney disease, unspecified: Secondary | ICD-10-CM | POA: Insufficient documentation

## 2016-11-03 DIAGNOSIS — M546 Pain in thoracic spine: Secondary | ICD-10-CM | POA: Insufficient documentation

## 2016-11-03 DIAGNOSIS — M25511 Pain in right shoulder: Secondary | ICD-10-CM

## 2016-11-03 DIAGNOSIS — M25512 Pain in left shoulder: Secondary | ICD-10-CM

## 2016-11-03 DIAGNOSIS — M542 Cervicalgia: Secondary | ICD-10-CM | POA: Diagnosis not present

## 2016-11-03 DIAGNOSIS — G894 Chronic pain syndrome: Secondary | ICD-10-CM

## 2016-11-03 NOTE — Patient Instructions (Addendum)
Pain Management Discharge Instructions  General Discharge Instructions :  If you need to reach your doctor call: Monday-Friday 8:00 am - 4:00 pm at [phone] or toll free [phone].  After clinic hours [phone] to have operator reach doctor.  Bring all of your medication bottles to all your appointments in the pain clinic.  To cancel or reschedule your appointment with Pain Management please remember to call 24 hours in advance to avoid a fee.  Refer to the educational materials which you have been given on: General Risks, I had my Procedure. Discharge Instructions, Post Sedation.  Post Procedure Instructions:  The drugs you were given will stay in your system until tomorrow, so for the next 24 hours you should not drive, make any legal decisions or drink any alcoholic beverages.  You may eat anything you prefer, but it is better to start with liquids then soups and crackers, and gradually work up to solid foods.  Please notify your doctor immediately if you have any unusual bleeding, trouble breathing or pain that is not related to your normal pain.  Depending on the type of procedure that was done, some parts of your body may feel week and/or numb.  This usually clears up by tonight or the next day.  Walk with the use of an assistive device or accompanied by an adult for the 24 hours.  You may use ice on the affected area for the first 24 hours.  Put ice in a Ziploc bag and cover with a towel and place against area 15 minutes on 15 minutes off.  You may switch to heat after 24 hours.   Nice to meet you today-- Remember to go for your blood work and x rays. Call after you go to your Psych appt for a 2nd visit

## 2016-11-03 NOTE — Progress Notes (Signed)
Safety precautions to be maintained throughout the outpatient stay will include: orient to surroundings, keep bed in low position, maintain call bell within reach at all times, provide assistance with transfer out of bed and ambulation.  

## 2016-11-03 NOTE — Progress Notes (Signed)
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 minutes on 15 minutes off.  You may switch to heat after 24 hours.   Nice to meet you today-- Remember to go for your blood work and x rays. Call after you go to your Psych appt for a 2nd visit

## 2016-11-09 LAB — COMPLIANCE DRUG ANALYSIS, UR

## 2016-11-11 ENCOUNTER — Other Ambulatory Visit: Payer: Self-pay | Admitting: Unknown Physician Specialty

## 2016-11-11 NOTE — Telephone Encounter (Signed)
Routing to provider. Next appointment 02/21/17.

## 2016-11-12 ENCOUNTER — Ambulatory Visit
Admission: RE | Admit: 2016-11-12 | Discharge: 2016-11-12 | Disposition: A | Discharge status: Home / Self Care | Payer: 59 | Source: Ambulatory Visit | Attending: Pain Medicine | Admitting: Pain Medicine

## 2016-11-12 ENCOUNTER — Other Ambulatory Visit
Admission: RE | Admit: 2016-11-12 | Discharge: 2016-11-12 | Disposition: A | Discharge status: Home / Self Care | Payer: 59 | Source: Ambulatory Visit | Attending: Pain Medicine | Admitting: Pain Medicine

## 2016-11-12 DIAGNOSIS — M533 Sacrococcygeal disorders, not elsewhere classified: Secondary | ICD-10-CM | POA: Insufficient documentation

## 2016-11-12 DIAGNOSIS — M4802 Spinal stenosis, cervical region: Secondary | ICD-10-CM | POA: Diagnosis not present

## 2016-11-12 DIAGNOSIS — M542 Cervicalgia: Principal | ICD-10-CM

## 2016-11-12 DIAGNOSIS — M545 Low back pain: Secondary | ICD-10-CM | POA: Diagnosis not present

## 2016-11-12 DIAGNOSIS — M19011 Primary osteoarthritis, right shoulder: Secondary | ICD-10-CM

## 2016-11-12 DIAGNOSIS — G894 Chronic pain syndrome: Secondary | ICD-10-CM | POA: Diagnosis not present

## 2016-11-12 DIAGNOSIS — G8929 Other chronic pain: Secondary | ICD-10-CM

## 2016-11-12 DIAGNOSIS — M19012 Primary osteoarthritis, left shoulder: Secondary | ICD-10-CM | POA: Insufficient documentation

## 2016-11-12 DIAGNOSIS — M50321 Other cervical disc degeneration at C4-C5 level: Secondary | ICD-10-CM | POA: Insufficient documentation

## 2016-11-12 DIAGNOSIS — M25511 Pain in right shoulder: Secondary | ICD-10-CM

## 2016-11-12 DIAGNOSIS — M25512 Pain in left shoulder: Secondary | ICD-10-CM

## 2016-11-12 LAB — SEDIMENTATION RATE: SED RATE: 3 mm/h (ref 0–20)

## 2016-11-12 LAB — COMPREHENSIVE METABOLIC PANEL
ALK PHOS: 55 U/L (ref 38–126)
ALT: 54 U/L (ref 17–63)
ANION GAP: 6 (ref 5–15)
AST: 43 U/L — ABNORMAL HIGH (ref 15–41)
Albumin: 4.3 g/dL (ref 3.5–5.0)
BILIRUBIN TOTAL: 0.8 mg/dL (ref 0.3–1.2)
BUN: 11 mg/dL (ref 6–20)
CO2: 27 mmol/L (ref 22–32)
Calcium: 9 mg/dL (ref 8.9–10.3)
Chloride: 105 mmol/L (ref 101–111)
Creatinine, Ser: 0.98 mg/dL (ref 0.61–1.24)
GFR calc non Af Amer: >60 mL/min (ref >60)
GLUCOSE: 94 mg/dL (ref 65–99)
Potassium: 4.2 mmol/L (ref 3.5–5.1)
Sodium: 138 mmol/L (ref 135–145)
TOTAL PROTEIN: 7 g/dL (ref 6.5–8.1)

## 2016-11-12 LAB — VITAMIN B12: Vitamin B-12: 820 pg/mL (ref 180–914)

## 2016-11-12 LAB — C-REACTIVE PROTEIN: CRP: 1.4 mg/dL — ABNORMAL HIGH (ref <1.0)

## 2016-11-12 LAB — MAGNESIUM: MAGNESIUM: 2 mg/dL (ref 1.7–2.4)

## 2016-11-15 LAB — 25-HYDROXY VITAMIN D LCMS D2+D3
25-Hydroxy, Vitamin D-2: <1 ng/mL
25-Hydroxy, Vitamin D: 23 ng/mL — ABNORMAL LOW

## 2016-11-15 LAB — 25-HYDROXYVITAMIN D LCMS D2+D3: 25-HYDROXY, VITAMIN D-3: 23 ng/mL

## 2017-01-04 ENCOUNTER — Encounter: Payer: Self-pay | Admitting: Pain Medicine

## 2017-01-04 ENCOUNTER — Ambulatory Visit: Payer: 59 | Attending: Pain Medicine | Admitting: Pain Medicine

## 2017-01-04 ENCOUNTER — Encounter (INDEPENDENT_AMBULATORY_CARE_PROVIDER_SITE_OTHER): Payer: Self-pay

## 2017-01-04 VITALS — BP 114/79 | HR 61 | Temp 98.1°F | Resp 16 | Ht 67.0 in | Wt 184.0 lb

## 2017-01-04 DIAGNOSIS — Z79899 Other long term (current) drug therapy: Secondary | ICD-10-CM | POA: Insufficient documentation

## 2017-01-04 DIAGNOSIS — F329 Major depressive disorder, single episode, unspecified: Secondary | ICD-10-CM | POA: Diagnosis not present

## 2017-01-04 DIAGNOSIS — Z79891 Long term (current) use of opiate analgesic: Secondary | ICD-10-CM | POA: Diagnosis not present

## 2017-01-04 DIAGNOSIS — Z823 Family history of stroke: Secondary | ICD-10-CM | POA: Insufficient documentation

## 2017-01-04 DIAGNOSIS — M25512 Pain in left shoulder: Secondary | ICD-10-CM | POA: Insufficient documentation

## 2017-01-04 DIAGNOSIS — M545 Low back pain, unspecified: Secondary | ICD-10-CM

## 2017-01-04 DIAGNOSIS — E785 Hyperlipidemia, unspecified: Secondary | ICD-10-CM | POA: Diagnosis not present

## 2017-01-04 DIAGNOSIS — Z809 Family history of malignant neoplasm, unspecified: Secondary | ICD-10-CM | POA: Diagnosis not present

## 2017-01-04 DIAGNOSIS — M549 Dorsalgia, unspecified: Secondary | ICD-10-CM | POA: Diagnosis not present

## 2017-01-04 DIAGNOSIS — G8929 Other chronic pain: Secondary | ICD-10-CM | POA: Diagnosis not present

## 2017-01-04 DIAGNOSIS — M542 Cervicalgia: Secondary | ICD-10-CM | POA: Diagnosis not present

## 2017-01-04 DIAGNOSIS — M62838 Other muscle spasm: Secondary | ICD-10-CM

## 2017-01-04 DIAGNOSIS — I129 Hypertensive chronic kidney disease with stage 1 through stage 4 chronic kidney disease, or unspecified chronic kidney disease: Secondary | ICD-10-CM | POA: Diagnosis not present

## 2017-01-04 DIAGNOSIS — M25511 Pain in right shoulder: Secondary | ICD-10-CM

## 2017-01-04 DIAGNOSIS — Z833 Family history of diabetes mellitus: Secondary | ICD-10-CM | POA: Insufficient documentation

## 2017-01-04 DIAGNOSIS — Z0001 Encounter for general adult medical examination with abnormal findings: Secondary | ICD-10-CM | POA: Diagnosis not present

## 2017-01-04 DIAGNOSIS — Z818 Family history of other mental and behavioral disorders: Secondary | ICD-10-CM | POA: Insufficient documentation

## 2017-01-04 DIAGNOSIS — F419 Anxiety disorder, unspecified: Secondary | ICD-10-CM | POA: Diagnosis not present

## 2017-01-04 DIAGNOSIS — M533 Sacrococcygeal disorders, not elsewhere classified: Secondary | ICD-10-CM

## 2017-01-04 DIAGNOSIS — F1190 Opioid use, unspecified, uncomplicated: Secondary | ICD-10-CM | POA: Diagnosis not present

## 2017-01-04 DIAGNOSIS — N189 Chronic kidney disease, unspecified: Secondary | ICD-10-CM | POA: Insufficient documentation

## 2017-01-04 DIAGNOSIS — G894 Chronic pain syndrome: Secondary | ICD-10-CM | POA: Diagnosis not present

## 2017-01-04 MED ORDER — BACLOFEN 10 MG PO TABS: 10.0000 mg | ORAL_TABLET | Freq: Every day | ORAL | 0 refills | Status: DC

## 2017-01-04 MED ORDER — HYDROCODONE-ACETAMINOPHEN 5-325 MG PO TABS: 1.0000 | ORAL_TABLET | Freq: Every day | ORAL | 0 refills | Status: DC | PRN

## 2017-01-04 NOTE — Progress Notes (Signed)
Safety precautions to be maintained throughout the outpatient stay will include: orient to surroundings, keep bed in low position, maintain call bell within reach at all times, provide assistance with transfer out of bed and ambulation.  

## 2017-01-04 NOTE — Patient Instructions (Addendum)
GENERAL RISKS AND COMPLICATIONS  What are the risk, side effects and possible complications? Generally speaking, most procedures are safe.  However, with any procedure there are risks, side effects, and the possibility of complications.  The risks and complications are dependent upon the sites that are lesioned, or the type of nerve block to be performed.  The closer the procedure is to the spine, the more serious the risks are.  Great care is taken when placing the radio frequency needles, block needles or lesioning probes, but sometimes complications can occur. 1. Infection: Any time there is an injection through the skin, there is a risk of infection.  This is why sterile conditions are used for these blocks.  There are four possible types of infection. 1. Localized skin infection. 2. Central Nervous System Infection-This can be in the form of Meningitis, which can be deadly. 3. Epidural Infections-This can be in the form of an epidural abscess, which can cause pressure inside of the spine, causing compression of the spinal cord with subsequent paralysis. This would require an emergency surgery to decompress, and there are no guarantees that the patient would recover from the paralysis. 4. Discitis-This is an infection of the intervertebral discs.  It occurs in about 1% of discography procedures.  It is difficult to treat and it may lead to surgery.        2. Pain: the needles have to go through skin and soft tissues, will cause soreness.       3. Damage to internal structures:  The nerves to be lesioned may be near blood vessels or    other nerves which can be potentially damaged.       4. Bleeding: Bleeding is more common if the patient is taking blood thinners such as  aspirin, Coumadin, Ticiid, Plavix, etc., or if he/she have some genetic predisposition  such as hemophilia. Bleeding into the spinal canal can cause compression of the spinal  cord with subsequent paralysis.  This would require an  emergency surgery to  decompress and there are no guarantees that the patient would recover from the  paralysis.       5. Pneumothorax:  Puncturing of a lung is a possibility, every time a needle is introduced in  the area of the chest or upper back.  Pneumothorax refers to free air around the  collapsed lung(s), inside of the thoracic cavity (chest cavity).  Another two possible  complications related to a similar event would include: Hemothorax and Chylothorax.   These are variations of the Pneumothorax, where instead of air around the collapsed  lung(s), you may have blood or chyle, respectively.       6. Spinal headaches: They may occur with any procedures in the area of the spine.       7. Persistent CSF (Cerebro-Spinal Fluid) leakage: This is a rare problem, but may occur  with prolonged intrathecal or epidural catheters either due to the formation of a fistulous  track or a dural tear.       8. Nerve damage: By working so close to the spinal cord, there is always a possibility of  nerve damage, which could be as serious as a permanent spinal cord injury with  paralysis.       9. Death:  Although rare, severe deadly allergic reactions known as "Anaphylactic  reaction" can occur to any of the medications used.      10. Worsening of the symptoms:  We can always make thing worse.    What are the chances of something like this happening? Chances of any of this occuring are extremely low.  By statistics, you have more of a chance of getting killed in a motor vehicle accident: while driving to the hospital than any of the above occurring .  Nevertheless, you should be aware that they are possibilities.  In general, it is similar to taking a shower.  Everybody knows that you can slip, hit your head and get killed.  Does that mean that you should not shower again?  Nevertheless always keep in mind that statistics do not mean anything if you happen to be on the wrong side of them.  Even if a procedure has a 1  (one) in a 1,000,000 (million) chance of going wrong, it you happen to be that one..Also, keep in mind that by statistics, you have more of a chance of having something go wrong when taking medications.  Who should not have this procedure? If you are on a blood thinning medication (e.g. Coumadin, Plavix, see list of "Blood Thinners"), or if you have an active infection going on, you should not have the procedure.  If you are taking any blood thinners, please inform your physician.  How should I prepare for this procedure?  Do not eat or drink anything at least six hours prior to the procedure.  Bring a driver with you .  It cannot be a taxi.  Come accompanied by an adult that can drive you back, and that is strong enough to help you if your legs get weak or numb from the local anesthetic.  Take all of your medicines the morning of the procedure with just enough water to swallow them.  If you have diabetes, make sure that you are scheduled to have your procedure done first thing in the morning, whenever possible.  If you have diabetes, take only half of your insulin dose and notify our nurse that you have done so as soon as you arrive at the clinic.  If you are diabetic, but only take blood sugar pills (oral hypoglycemic), then do not take them on the morning of your procedure.  You may take them after you have had the procedure.  Do not take aspirin or any aspirin-containing medications, at least eleven (11) days prior to the procedure.  They may prolong bleeding.  Wear loose fitting clothing that may be easy to take off and that you would not mind if it got stained with Betadine or blood.  Do not wear any jewelry or perfume  Remove any nail coloring.  It will interfere with some of our monitoring equipment.  NOTE: Remember that this is not meant to be interpreted as a complete list of all possible complications.  Unforeseen problems may occur.  BLOOD THINNERS The following drugs  contain aspirin or other products, which can cause increased bleeding during surgery and should not be taken for 2 weeks prior to and 1 week after surgery.  If you should need take something for relief of minor pain, you may take acetaminophen which is found in Tylenol,m Datril, Anacin-3 and Panadol. It is not blood thinner. The products listed below are.  Do not take any of the products listed below in addition to any listed on your instruction sheet.  A.P.C or A.P.C with Codeine Codeine Phosphate Capsules #3 Ibuprofen Ridaura  ABC compound Congesprin Imuran rimadil  Advil Cope Indocin Robaxisal  Alka-Seltzer Effervescent Pain Reliever and Antacid Coricidin or Coricidin-D  Indomethacin Rufen    Alka-Seltzer plus Cold Medicine Cosprin Ketoprofen S-A-C Tablets  Anacin Analgesic Tablets or Capsules Coumadin Korlgesic Salflex  Anacin Extra Strength Analgesic tablets or capsules CP-2 Tablets Lanoril Salicylate  Anaprox Cuprimine Capsules Levenox Salocol  Anexsia-D Dalteparin Magan Salsalate  Anodynos Darvon compound Magnesium Salicylate Sine-off  Ansaid Dasin Capsules Magsal Sodium Salicylate  Anturane Depen Capsules Marnal Soma  APF Arthritis pain formula Dewitt's Pills Measurin Stanback  Argesic Dia-Gesic Meclofenamic Sulfinpyrazone  Arthritis Bayer Timed Release Aspirin Diclofenac Meclomen Sulindac  Arthritis pain formula Anacin Dicumarol Medipren Supac  Analgesic (Safety coated) Arthralgen Diffunasal Mefanamic Suprofen  Arthritis Strength Bufferin Dihydrocodeine Mepro Compound Suprol  Arthropan liquid Dopirydamole Methcarbomol with Aspirin Synalgos  ASA tablets/Enseals Disalcid Micrainin Tagament  Ascriptin Doan's Midol Talwin  Ascriptin A/D Dolene Mobidin Tanderil  Ascriptin Extra Strength Dolobid Moblgesic Ticlid  Ascriptin with Codeine Doloprin or Doloprin with Codeine Momentum Tolectin  Asperbuf Duoprin Mono-gesic Trendar  Aspergum Duradyne Motrin or Motrin IB Triminicin  Aspirin  plain, buffered or enteric coated Durasal Myochrisine Trigesic  Aspirin Suppositories Easprin Nalfon Trillsate  Aspirin with Codeine Ecotrin Regular or Extra Strength Naprosyn Uracel  Atromid-S Efficin Naproxen Ursinus  Auranofin Capsules Elmiron Neocylate Vanquish  Axotal Emagrin Norgesic Verin  Azathioprine Empirin or Empirin with Codeine Normiflo Vitamin E  Azolid Emprazil Nuprin Voltaren  Bayer Aspirin plain, buffered or children's or timed BC Tablets or powders Encaprin Orgaran Warfarin Sodium  Buff-a-Comp Enoxaparin Orudis Zorpin  Buff-a-Comp with Codeine Equegesic Os-Cal-Gesic   Buffaprin Excedrin plain, buffered or Extra Strength Oxalid   Bufferin Arthritis Strength Feldene Oxphenbutazone   Bufferin plain or Extra Strength Feldene Capsules Oxycodone with Aspirin   Bufferin with Codeine Fenoprofen Fenoprofen Pabalate or Pabalate-SF   Buffets II Flogesic Panagesic   Buffinol plain or Extra Strength Florinal or Florinal with Codeine Panwarfarin   Buf-Tabs Flurbiprofen Penicillamine   Butalbital Compound Four-way cold tablets Penicillin   Butazolidin Fragmin Pepto-Bismol   Carbenicillin Geminisyn Percodan   Carna Arthritis Reliever Geopen Persantine   Carprofen Gold's salt Persistin   Chloramphenicol Goody's Phenylbutazone   Chloromycetin Haltrain Piroxlcam   Clmetidine heparin Plaquenil   Cllnoril Hyco-pap Ponstel   Clofibrate Hydroxy chloroquine Propoxyphen         Before stopping any of these medications, be sure to consult the physician who ordered them.  Some, such as Coumadin (Warfarin) are ordered to prevent or treat serious conditions such as "deep thrombosis", "pumonary embolisms", and other heart problems.  The amount of time that you may need off of the medication may also vary with the medication and the reason for which you were taking it.  If you are taking any of these medications, please make sure you notify your pain physician before you undergo any  procedures.         Epidural Steroid Injection Patient Information  Description: The epidural space surrounds the nerves as they exit the spinal cord.  In some patients, the nerves can be compressed and inflamed by a bulging disc or a tight spinal canal (spinal stenosis).  By injecting steroids into the epidural space, we can bring irritated nerves into direct contact with a potentially helpful medication.  These steroids act directly on the irritated nerves and can reduce swelling and inflammation which often leads to decreased pain.  Epidural steroids may be injected anywhere along the spine and from the neck to the low back depending upon the location of your pain.   After numbing the skin with local anesthetic (like Novocaine), a small needle is passed   into the epidural space slowly.  You may experience a sensation of pressure while this is being done.  The entire block usually last less than 10 minutes.  Conditions which may be treated by epidural steroids:   Low back and leg pain  Neck and arm pain  Spinal stenosis  Post-laminectomy syndrome  Herpes zoster (shingles) pain  Pain from compression fractures  Preparation for the injection:  1. Do not eat any solid food or dairy products within 8 hours of your appointment.  2. You may drink clear liquids up to 3 hours before appointment.  Clear liquids include water, black coffee, juice or soda.  No milk or cream please. 3. You may take your regular medication, including pain medications, with a sip of water before your appointment  Diabetics should hold regular insulin (if taken separately) and take 1/2 normal NPH dos the morning of the procedure.  Carry some sugar containing items with you to your appointment. 4. A driver must accompany you and be prepared to drive you home after your procedure.  5. Bring all your current medications with your. 6. An IV may be inserted and sedation may be given at the discretion of the  physician.   7. A blood pressure cuff, EKG and other monitors will often be applied during the procedure.  Some patients may need to have extra oxygen administered for a short period. 8. You will be asked to provide medical information, including your allergies, prior to the procedure.  We must know immediately if you are taking blood thinners (like Coumadin/Warfarin)  Or if you are allergic to IV iodine contrast (dye). We must know if you could possible be pregnant.  Possible side-effects:  Bleeding from needle site  Infection (rare, may require surgery)  Nerve injury (rare)  Numbness & tingling (temporary)  Difficulty urinating (rare, temporary)  Spinal headache ( a headache worse with upright posture)  Light -headedness (temporary)  Pain at injection site (several days)  Decreased blood pressure (temporary)  Weakness in arm/leg (temporary)  Pressure sensation in back/neck (temporary)  Call if you experience:  Fever/chills associated with headache or increased back/neck pain.  Headache worsened by an upright position.  New onset weakness or numbness of an extremity below the injection site  Hives or difficulty breathing (go to the emergency room)  Inflammation or drainage at the infection site  Severe back/neck pain  Any new symptoms which are concerning to you  Please note:  Although the local anesthetic injected can often make your back or neck feel good for several hours after the injection, the pain will likely return.  It takes 3-7 days for steroids to work in the epidural space.  You may not notice any pain relief for at least that one week.  If effective, we will often do a series of three injections spaced 3-6 weeks apart to maximally decrease your pain.  After the initial series, we generally will wait several months before considering a repeat injection of the same type.  If you have any questions, please call [phone] [facility] 

## 2017-01-04 NOTE — Progress Notes (Signed)
Patient's Name: Jose Washington  MRN: [id number]  Referring Provider: Kathrine Haddock, NP  DOB: [birth year]  PCP: Kathrine Haddock, NP  DOS: 01/04/2017  Note by: Kathlen Brunswick. Dossie Arbour, MD  Service setting: Ambulatory outpatient  Specialty: Interventional Pain Management  Location: [facility] (AMB) Pain Management Facility    Patient type: Established   Primary Reason(s) for Visit: Encounter for evaluation before starting new chronic pain management plan of care (Level of risk: moderate) CC: Back Pain (low , bilateral and worse on the right) and Neck Pain (bilateral)  HPI  Jose Washington is a 51 y.o. year old, male patient, who comes today for a follow-up evaluation to review the test results and decide on a treatment plan. He has Chronic neck pain (Location of Primary Source of Pain) (Bilateral) (L>R); Hyperlipidemia; Benign hypertension with chronic kidney disease; Gastritis; Chronic pain syndrome; Chronic low back pain (Location of Secondary source of pain) (Bilateral) (L>R); Chronic upper back pain (Location of Tertiary source of pain) (Bilateral) (L>R); Chronic shoulder pain (Bilateral) (L>R); Osteoarthritis of right AC (acromioclavicular) joint; Chronic sacroiliac joint pain (Bilateral) (L>R); Long term current use of opiate analgesic; Long term prescription opiate use; Opiate use; and Muscle spasticity on his problem list. His primarily concern today is the Back Pain (low , bilateral and worse on the right) and Neck Pain (bilateral)  Pain Assessment: Self-Reported Pain Score: 3 /10             Reported level is compatible with observation.       Pain Type: Chronic pain Pain Location: Back Pain Descriptors / Indicators: Constant, Nagging Pain Frequency: Constant  Jose Washington comes in today for a follow-up visit after his initial evaluation on 11/03/2016. Today we went over the results of his tests. These were explained in "Layman's terms". During today's appointment we went over my diagnostic impression, as  well as the proposed treatment plan.  In considering the treatment plan options, Mr. Gallicchio was reminded that I no longer take patients for medication management only. I asked him to let me know if he had no intention of taking advantage of the interventional therapies, so that we could make arrangements to provide this space to someone interested. I also made it clear that undergoing interventional therapies for the purpose of getting pain medications is very inappropriate on the part of a patient, and it will not be tolerated in this practice. This type of behavior would suggest true addiction and therefore it requires referral to an addiction specialist.   Further details on both, my assessment(s), as well as the proposed treatment plan, please see below. Controlled Substance Pharmacotherapy Assessment REMS (Risk Evaluation and Mitigation Strategy)  Analgesic: Hydrocodone/APAP 5/325 one daily MME/day: 5 mg/day Pill Count: None expected due to no prior prescriptions written by our practice. Pharmacokinetics: Liberation and absorption (onset of action): WNL Distribution (time to peak effect): WNL Metabolism and excretion (duration of action): WNL         Pharmacodynamics: Desired effects: Analgesia: Mr. Bluett reports >50% benefit. Functional ability: Patient reports that medication allows him to accomplish basic ADLs Clinically meaningful improvement in function (CMIF): Sustained CMIF goals met Perceived effectiveness: Described as relatively effective, allowing for increase in activities of daily living (ADL) Undesirable effects: Side-effects or Adverse reactions: None reported Monitoring: [place] PMP: Online review of the past 5-monthperiod previously conducted. Not applicable at this point since we have not taken over the patient's medication management yet. List of all Serum Drug Screening Test(s):  No results  found for: AMPHSCRSER, BARBSCRSER, BENZOSCRSER, COCAINSCRSER, PCPSCRSER,  THCSCRSER, OPIATESCRSER, OXYSCRSER, PROPOXSCRSER List of all UDS test(s) done:  Lab Results  Component Value Date   SUMMARY FINAL 11/03/2016   Last UDS on record: Summary  Date Value Ref Range Status  11/03/2016 FINAL  Final    Comment:    ==================================================================== TOXASSURE COMP DRUG ANALYSIS,UR ==================================================================== Test                             Result       Flag       Units Drug Present and Declared for Prescription Verification   Baclofen                       PRESENT      EXPECTED   Duloxetine                     PRESENT      EXPECTED   Acetaminophen                  PRESENT      EXPECTED   Atenolol                       PRESENT      EXPECTED Drug Present not Declared for Prescription Verification   Ephedrine/Pseudoephedrine      PRESENT      UNEXPECTED   Phenylpropanolamine            PRESENT      UNEXPECTED    Source of ephedrine/pseudoephedrine is most commonly    pseudoephedrine in over-the-counter or prescription cold and    allergy medications. Phenylpropanolamine is an expected    metabolite of ephedrine/pseudoephedrine. Drug Absent but Declared for Prescription Verification   Hydrocodone                    Not Detected UNEXPECTED ng/mg creat   Cyclobenzaprine                Not Detected UNEXPECTED ==================================================================== Test                      Result    Flag   Units      Ref Range   Creatinine              232              mg/dL      >=20 ==================================================================== Declared Medications:  The flagging and interpretation on this report are based on the  following declared medications.  Unexpected results may arise from  inaccuracies in the declared medications.  **Note: The testing scope of this panel includes these medications:  Atenolol (Tenormin)  Baclofen (Lioresal)   Cyclobenzaprine (Flexeril)  Duloxetine (Cymbalta)  Hydrocodone (Norco)  **Note: The testing scope of this panel does not include small to  moderate amounts of these reported medications:  Acetaminophen (Norco)  Acetaminophen (Tylenol)  **Note: The testing scope of this panel does not include following  reported medications:  Atorvastatin (Lipitor)  Buspirone (BuSpar)  Chondroitin  Chondroitin (Glucosamine-Chondroitin)  Cyanocobalamin  Fenofibrate  Fluticasone (Flonase)  Glucosamine (Glucosamine-Chondroitin)  Iron (Ferrous Sulfate)  Melatonin  Methylsulfonylmethane  Multivitamin  Omeprazole (Prilosec)  Ondansetron (Zofran)  Turmeric ==================================================================== For clinical consultation, please call [phone]. ====================================================================    UDS interpretation: No unexpected findings.  Medication Assessment Form: Patient introduced to form today Treatment compliance: Treatment may start today if patient agrees with proposed plan. Evaluation of compliance is not applicable at this point Risk Assessment Profile: Aberrant behavior: See initial evaluations. None observed or detected today Comorbid factors increasing risk of overdose: See initial evaluation. No additional risks detected today Risk Mitigation Strategies:  Patient opioid safety counseling: Completed today. Counseling provided to patient as per "Patient Counseling Document". Document signed by patient, attesting to counseling and understanding Patient-Prescriber Agreement (PPA): Obtained today  Controlled substance notification to other providers: Written and sent today  Pharmacologic Plan: Today we may be taking over the patient's pharmacological regimen. See below  Laboratory Chemistry  Inflammation Markers Lab Results  Component Value Date   ESRSEDRATE 3 11/12/2016   CRP 1.4 (H) 11/12/2016   Renal Function  Markers Lab Results  Component Value Date   BUN 11 11/12/2016   CREATININE 0.98 11/12/2016   GFRAA >60 11/12/2016   GFRNONAA >60 11/12/2016   Hepatic Function Markers Lab Results  Component Value Date   AST 43 (H) 11/12/2016   ALT 54 11/12/2016   ALBUMIN 4.3 11/12/2016   ALKPHOS 55 11/12/2016   Electrolytes Lab Results  Component Value Date   NA 138 11/12/2016   K 4.2 11/12/2016   CL 105 11/12/2016   CALCIUM 9.0 11/12/2016   MG 2.0 11/12/2016   Neuropathy Markers Lab Results  Component Value Date   VITAMINB12 820 11/12/2016   Bone Pathology Markers Lab Results  Component Value Date   ALKPHOS 55 11/12/2016   25OHVITD1 23 (L) 11/12/2016   25OHVITD2 <1.0 11/12/2016   25OHVITD3 23 11/12/2016   CALCIUM 9.0 11/12/2016   Coagulation Parameters Lab Results  Component Value Date   PLT 405 (H) 02/18/2016   Cardiovascular Markers Lab Results  Component Value Date   HCT 45.3 02/18/2016   Note: Lab results reviewed.  Recent Diagnostic Imaging Review  Dg Cervical Spine Complete Result Date: 11/12/2016 CLINICAL DATA:  Chronic neck pain. EXAM: CERVICAL SPINE - COMPLETE 4+ VIEW COMPARISON:  None. FINDINGS: No fracture or significant spondylolisthesis is noted. No prevertebral soft tissue swelling is noted. Mild to moderate degenerative disc disease is noted at C4-5, C5-6, C6-7 and C7-T1. Mild bilateral neural foraminal stenosis is noted on the right at C5-6 and C6-7, and on the left at C4-5, C5-6, C6-7 and C7-T1 secondary to uncovertebral spurring. IMPRESSION: Multilevel degenerative disc disease. Bilateral neural foraminal stenosis is noted secondary to uncovertebral spurring. No acute abnormality seen in the cervical spine. Electronically Signed   By: Marijo Conception, M.D.   On: 11/12/2016 15:30   Dg Lumbar Spine Complete W/bend Result Date: 11/12/2016 CLINICAL DATA:  Non radiating low back pain.  No known injury. EXAM: LUMBAR SPINE - COMPLETE WITH BENDING VIEWS COMPARISON:   None. FINDINGS: Vertebral body height is maintained. Trace retrolisthesis L3 on L4 is identified. There is some loss of disc space height at L3-4. Facet arthropathy is seen at L4-5 and L5-S1. Range of motion appears limited with flexion and extension but no pathologic motion is identified. IMPRESSION: No acute abnormality. Mild appearing lower lumbar degenerative change. Limited range of motion without pathologic motion. Electronically Signed   By: Inge Rise M.D.   On: 11/12/2016 15:29   Dg Si Joints Result Date: 11/12/2016 CLINICAL DATA:  Chronic bilateral low back pain.  No known injury. EXAM: BILATERAL SACROILIAC JOINTS - 3+ VIEW COMPARISON:  None. FINDINGS: The sacroiliac joint spaces are maintained and there is  no evidence of arthropathy. No other bone abnormalities are seen. IMPRESSION: Negative exam. Electronically Signed   By: Inge Rise M.D.   On: 11/12/2016 15:29   Dg Shoulder Right Result Date: 11/12/2016 CLINICAL DATA:  Bilateral shoulder pain.  No known injury. EXAM: RIGHT SHOULDER - 2+ VIEW COMPARISON:  None. FINDINGS: There is no evidence of fracture or dislocation. Mild to moderate acromioclavicular osteoarthritis is seen. Soft tissues are unremarkable. IMPRESSION: Mild to moderate acromioclavicular osteoarthritis. Otherwise negative. Electronically Signed   By: Inge Rise M.D.   On: 11/12/2016 15:30   Dg Shoulder Lef Result Date: 11/12/2016 CLINICAL DATA:  Bilateral shoulder pain, chronic.  No known injury. EXAM: LEFT SHOULDER - 2+ VIEW COMPARISON:  None. FINDINGS: There is no evidence of fracture or dislocation. Mild acromioclavicular degenerative change is seen. Soft tissues are unremarkable. IMPRESSION: Mild acromioclavicular osteoarthritis.  Otherwise negative. Electronically Signed   By: Inge Rise M.D.   On: 11/12/2016 15:29   Cervical Imaging: Cervical DG complete:  Results for orders placed during the hospital encounter of 11/12/16  DG Cervical Spine  Complete   Narrative CLINICAL DATA:  Chronic neck pain.  EXAM: CERVICAL SPINE - COMPLETE 4+ VIEW  COMPARISON:  None.  FINDINGS: No fracture or significant spondylolisthesis is noted. No prevertebral soft tissue swelling is noted. Mild to moderate degenerative disc disease is noted at C4-5, C5-6, C6-7 and C7-T1. Mild bilateral neural foraminal stenosis is noted on the right at C5-6 and C6-7, and on the left at C4-5, C5-6, C6-7 and C7-T1 secondary to uncovertebral spurring.  IMPRESSION: Multilevel degenerative disc disease. Bilateral neural foraminal stenosis is noted secondary to uncovertebral spurring. No acute abnormality seen in the cervical spine.   Electronically Signed   By: Marijo Conception, M.D.   On: 11/12/2016 15:30    Shoulder Imaging: Shoulder-R DG:  Results for orders placed during the hospital encounter of 11/12/16  DG Shoulder Right   Narrative CLINICAL DATA:  Bilateral shoulder pain.  No known injury.  EXAM: RIGHT SHOULDER - 2+ VIEW  COMPARISON:  None.  FINDINGS: There is no evidence of fracture or dislocation. Mild to moderate acromioclavicular osteoarthritis is seen. Soft tissues are unremarkable.  IMPRESSION: Mild to moderate acromioclavicular osteoarthritis. Otherwise negative.   Electronically Signed   By: Inge Rise M.D.   On: 11/12/2016 15:30    Shoulder-L DG:  Results for orders placed during the hospital encounter of 11/12/16  DG Shoulder Left   Narrative CLINICAL DATA:  Bilateral shoulder pain, chronic.  No known injury.  EXAM: LEFT SHOULDER - 2+ VIEW  COMPARISON:  None.  FINDINGS: There is no evidence of fracture or dislocation. Mild acromioclavicular degenerative change is seen. Soft tissues are unremarkable.  IMPRESSION: Mild acromioclavicular osteoarthritis.  Otherwise negative.   Electronically Signed   By: Inge Rise M.D.   On: 11/12/2016 15:29    Lumbosacral Imaging: Lumbar DG Bending views:   Results for orders placed during the hospital encounter of 11/12/16  DG Lumbar Spine Complete W/Bend   Narrative CLINICAL DATA:  Non radiating low back pain.  No known injury.  EXAM: LUMBAR SPINE - COMPLETE WITH BENDING VIEWS  COMPARISON:  None.  FINDINGS: Vertebral body height is maintained. Trace retrolisthesis L3 on L4 is identified. There is some loss of disc space height at L3-4. Facet arthropathy is seen at L4-5 and L5-S1. Range of motion appears limited with flexion and extension but no pathologic motion is identified.  IMPRESSION: No acute abnormality.  Mild appearing lower  lumbar degenerative change.  Limited range of motion without pathologic motion.   Electronically Signed   By: Inge Rise M.D.   On: 11/12/2016 15:29    Sacroiliac Joint Imaging: Sacroiliac Joint DG:  Results for orders placed during the hospital encounter of 11/12/16  DG Si Joints   Narrative CLINICAL DATA:  Chronic bilateral low back pain.  No known injury.  EXAM: BILATERAL SACROILIAC JOINTS - 3+ VIEW  COMPARISON:  None.  FINDINGS: The sacroiliac joint spaces are maintained and there is no evidence of arthropathy. No other bone abnormalities are seen.  IMPRESSION: Negative exam.   Electronically Signed   By: Inge Rise M.D.   On: 11/12/2016 15:29    Note: Results of ordered imaging test(s) reviewed and explained to patient in Layman's terms. Copy of results provided to patient  Meds  The patient has a current medication list which includes the following prescription(s): acetaminophen, atenolol, atorvastatin, baclofen, buspirone, chondroitin sulfate, duloxetine, fenofibrate, ferrous sulfate, fluticasone, glucosamine-chondroit-vit c-mn, hydrocodone-acetaminophen, melatonin, methylsulfonylmethane, multivitamin, omeprazole, ondansetron, turmeric, and vitamin b-12.  Current Outpatient Prescriptions on File Prior to Visit  Medication Sig  . acetaminophen (TYLENOL) 500 MG  tablet Take 500 mg by mouth every 6 (six) hours as needed.  Marland Kitchen atenolol (TENORMIN) 50 MG tablet Take 1 tablet (50 mg total) by mouth daily.  Marland Kitchen atorvastatin (LIPITOR) 40 MG tablet Take 1 tablet (40 mg total) by mouth daily.  . busPIRone (BUSPAR) 30 MG tablet Take 1 tablet (30 mg total) by mouth 2 (two) times daily.  . Chondroitin Sulfate 400 MG CAPS Take by mouth 2 (two) times daily.  . DULoxetine (CYMBALTA) 30 MG capsule TAKE 1 CAPSULE (30 MG TOTAL) BY MOUTH 2 (TWO) TIMES DAILY.  . fenofibrate 160 MG tablet Take 1 tablet (160 mg total) by mouth daily.  . ferrous sulfate (QC FERROUS SULFATE) 325 (65 FE) MG tablet Take 325 mg by mouth daily with breakfast.  . fluticasone (FLONASE) 50 MCG/ACT nasal spray Place 2 sprays into both nostrils daily.  . Glucosamine-Chondroit-Vit C-Mn (GLUCOSAMINE 1500 COMPLEX PO) Take by mouth 2 (two) times daily.  . Melatonin 3 MG CAPS Take by mouth 3 (three) times a week.  . Methylsulfonylmethane (MSM PO) Take by mouth 2 (two) times daily.  . Multiple Vitamin (MULTIVITAMIN) tablet Take 1 tablet by mouth daily.  Marland Kitchen omeprazole (PRILOSEC) 20 MG capsule Take 1 capsule (20 mg total) by mouth daily.  . ondansetron (ZOFRAN) 8 MG tablet Take 1 tablet (8 mg total) by mouth 2 (two) times daily.  . TURMERIC PO Take by mouth daily.   . vitamin B-12 (CYANOCOBALAMIN) 1000 MCG tablet Take 1,000 mcg by mouth daily.   No current facility-administered medications on file prior to visit.    ROS  Constitutional: Denies any fever or chills Gastrointestinal: No reported hemesis, hematochezia, vomiting, or acute GI distress Musculoskeletal: Denies any acute onset joint swelling, redness, loss of ROM, or weakness Neurological: No reported episodes of acute onset apraxia, aphasia, dysarthria, agnosia, amnesia, paralysis, loss of coordination, or loss of consciousness  Allergies  Mr. Joynt has No Known Allergies.  [place]  Drug: Mr. Ayala  reports that he does not use drugs. Alcohol:   reports that he does not drink alcohol. Tobacco:  reports that he has never smoked. He has never used smokeless tobacco. Medical:  has a past medical history of Allergy; Anxiety; Chronic duodenal ulcer with hemorrhage (2012); Chronic neck pain; Depression; Hyperlipidemia; Hypertension; and Microscopic hematuria. Family: family history includes Allergies in  his brother; Cancer in his maternal grandfather and mother; Dementia in his father and paternal grandfather; Depression in his father; Diabetes in his mother; Mental illness in his father; Stroke in his paternal grandfather.  Past Surgical History:  Procedure Laterality Date  . bLEEDING ULCER  2012  . eye muscle repair  1972 and 1975   Constitutional Exam  General appearance: Well nourished, well developed, and well hydrated. In no apparent acute distress Vitals:   01/04/17 0956  BP: 114/79  Pulse: 61  Resp: 16  Temp: 98.1 F (36.7 C)  TempSrc: Oral  SpO2: 99%  Weight: 184 lb (83.5 kg)  Height: _0  (1.702 m)   BMI Assessment: Estimated body mass index is 28.82 kg/m as calculated from the following:   Height as of this encounter: _1  (1.702 m).   Weight as of this encounter: 184 lb (83.5 kg).  BMI interpretation table: BMI level Category Range association with higher incidence of chronic pain  <18 kg/m2 Underweight   18.5-24.9 kg/m2 Ideal body weight   25-29.9 kg/m2 Overweight Increased incidence by 20%  30-34.9 kg/m2 Obese (Class I) Increased incidence by 68%  35-39.9 kg/m2 Severe obesity (Class II) Increased incidence by 136%  >40 kg/m2 Extreme obesity (Class III) Increased incidence by 254%   BMI Readings from Last 4 Encounters:  01/04/17 28.82 kg/m  11/03/16 29.70 kg/m  08/11/16 28.32 kg/m  05/12/16 28.94 kg/m   Wt Readings from Last 4 Encounters:  01/04/17 184 lb (83.5 kg)  11/03/16 184 lb (83.5 kg)  08/11/16 179 lb 3.2 oz (81.3 kg)  05/12/16 182 lb (82.6 kg)  Psych/Mental status: Alert, oriented x 3  (person, place, & time)       Eyes: PERLA Respiratory: No evidence of acute respiratory distress  Cervical Spine Exam  Inspection: No masses, redness, or swelling Alignment: Symmetrical Functional ROM: Unrestricted ROM Stability: No instability detected Muscle strength & Tone: Functionally intact Sensory: Unimpaired Palpation: Non-contributory  Upper Extremity (UE) Exam    Side: Right upper extremity  Side: Left upper extremity  Inspection: No masses, redness, swelling, or asymmetry. No contractures  Inspection: No masses, redness, swelling, or asymmetry. No contractures  Functional ROM: Unrestricted ROM          Functional ROM: Unrestricted ROM          Muscle strength & Tone: Functionally intact  Muscle strength & Tone: Functionally intact  Sensory: Unimpaired  Sensory: Unimpaired  Palpation: Euthermic  Palpation: Euthermic  Specialized Test(s): Deferred         Specialized Test(s): Deferred          Thoracic Spine Exam  Inspection: No masses, redness, or swelling Alignment: Symmetrical Functional ROM: Unrestricted ROM Stability: No instability detected Sensory: Unimpaired Muscle strength & Tone: Functionally intact Palpation: Non-contributory  Lumbar Spine Exam  Inspection: No masses, redness, or swelling Alignment: Symmetrical Functional ROM: Unrestricted ROM Stability: No instability detected Muscle strength & Tone: Functionally intact Sensory: Unimpaired Palpation: Non-contributory Provocative Tests: Lumbar Hyperextension and rotation test: evaluation deferred today       Patrick's Maneuver: evaluation deferred today              Gait & Posture Assessment  Ambulation: Unassisted Gait: Relatively normal for age and body habitus Posture: WNL   Lower Extremity Exam    Side: Right lower extremity  Side: Left lower extremity  Inspection: No masses, redness, swelling, or asymmetry. No contractures  Inspection: No masses, redness, swelling, or asymmetry. No  contractures  Functional  ROM: Unrestricted ROM          Functional ROM: Unrestricted ROM          Muscle strength & Tone: Functionally intact  Muscle strength & Tone: Functionally intact  Sensory: Unimpaired  Sensory: Unimpaired  Palpation: No palpable anomalies  Palpation: No palpable anomalies   Assessment & Plan  Primary Diagnosis & Pertinent Problem List: The primary encounter diagnosis was Chronic pain syndrome. Diagnoses of Chronic neck pain (Location of Primary Source of Pain) (Bilateral) (L>R), Chronic low back pain (Location of Secondary source of pain) (Bilateral) (L>R), Chronic upper back pain (Location of Tertiary source of pain) (Bilateral) (L>R), Chronic shoulder pain (Bilateral) (L>R), Chronic sacroiliac joint pain (Bilateral) (L>R), Long term current use of opiate analgesic, Opiate use, Long term prescription opiate use, and Muscle spasticity were also pertinent to this visit.  Visit Diagnosis: 1. Chronic pain syndrome   2. Chronic neck pain (Location of Primary Source of Pain) (Bilateral) (L>R)   3. Chronic low back pain (Location of Secondary source of pain) (Bilateral) (L>R)   4. Chronic upper back pain (Location of Tertiary source of pain) (Bilateral) (L>R)   5. Chronic shoulder pain (Bilateral) (L>R)   6. Chronic sacroiliac joint pain (Bilateral) (L>R)   7. Long term current use of opiate analgesic   8. Opiate use   9. Long term prescription opiate use   10. Muscle spasticity    Problems updated and reviewed during this visit: Problem  Muscle Spasticity  Chronic low back pain (Location of Secondary source of pain) (Bilateral) (L>R)  Chronic upper back pain (Location of Tertiary source of pain) (Bilateral) (L>R)  Chronic shoulder pain (Bilateral) (L>R)  Chronic sacroiliac joint pain (Bilateral) (L>R)  Chronic neck pain (Location of Primary Source of Pain) (Bilateral) (L>R)   Pt seems to be doing well with present medications.  Uses Hydrocodone sparingly.  Sees a  chiropractor for regular treatments.      Problem-specific Plan(s): No problem-specific Assessment & Plan notes found for this encounter.  Assessment & plan notes cannot be loaded without a specified hospital service.  Plan of Care  Pharmacotherapy (Medications Ordered): Meds ordered this encounter  Medications  . baclofen (LIORESAL) 10 MG tablet    Sig: Take 1 tablet (10 mg total) by mouth at bedtime.    Dispense:  90 tablet    Refill:  0    Do not place medication on "Automatic Refill". Fill one day early if pharmacy is closed on scheduled refill date.  Marland Kitchen HYDROcodone-acetaminophen (NORCO/VICODIN) 5-325 MG tablet    Sig: Take 1 tablet by mouth daily as needed for moderate pain.    Dispense:  10 tablet    Refill:  0    Fill one day early if pharmacy is closed on scheduled refill date. Do not fill until: 01/04/17 To last until: 02/03/17   Lab-work, procedure(s), and/or referral(s): Orders Placed This Encounter  Procedures  . Cervical Epidural Injection    Pharmacotherapy: Opioid Analgesics: We'll take over management today. See above orders Membrane stabilizer: We have discussed the possibility of optimizing this mode of therapy, if tolerated Muscle relaxant: We have discussed the possibility of a trial NSAID: We have discussed the possibility of a trial Other analgesic(s): To be determined at a later time   Interventional therapies: Planned, scheduled, and/or pending:    Diagnostic left CESI under fluoroscopy   Considering:   Diagnostic bilateral lumbar facet block  Possible bilateral lumbar facet RFA Diagnostic right-sided cervical epidural steroid injection  Diagnostic bilateral cervical facet block  Possible bilateral cervical facet RFA Diagnostic bilateral sacroiliac joint block  Possible bilateral sacroiliac joint RFA  Diagnostic trigger point injections    PRN Procedures:   To be determined at a later time   Provider-requested follow-up: Return in about 1  month (around 02/04/2017) for procedure (ASAP).  Future Appointments Date Time Provider [place]  01/27/2017 8:45 AM Milinda Pointer, MD [facility] None  02/21/2017 10:00 AM Kathrine Haddock, NP [facility] None    Primary Care Physician: Kathrine Haddock, NP Location: [facility] Note by: Kathlen Brunswick. Dossie Arbour, M.D, DABA, DABAPM, DABPM, DABIPP, FIPP Date: 01/04/2017; Time: 1:04 PM  Pain Score Disclaimer: We use the NRS-11 scale. This is a self-reported, subjective measurement of pain severity with only modest accuracy. It is used primarily to identify changes within a particular patient. It must be understood that outpatient pain scales are significantly less accurate that those used for research, where they can be applied under ideal controlled circumstances with minimal exposure to variables. In reality, the score is likely to be a combination of pain intensity and pain affect, where pain affect describes the degree of emotional arousal or changes in action readiness caused by the sensory experience of pain. Factors such as social and work situation, setting, emotional state, anxiety levels, expectation, and prior pain experience may influence pain perception and show large inter-individual differences that may also be affected by time variables.  Patient instructions provided during this appointment: Patient Instructions    GENERAL RISKS AND COMPLICATIONS  What are the risk, side effects and possible complications? Generally speaking, most procedures are safe.  However, with any procedure there are risks, side effects, and the possibility of complications.  The risks and complications are dependent upon the sites that are lesioned, or the type of nerve block to be performed.  The closer the procedure is to the spine, the more serious the risks are.  Great care is taken when placing the radio frequency needles, block needles or lesioning probes, but sometimes  complications can occur. 1. Infection: Any time there is an injection through the skin, there is a risk of infection.  This is why sterile conditions are used for these blocks.  There are four possible types of infection. 1. Localized skin infection. 2. Central Nervous System Infection-This can be in the form of Meningitis, which can be deadly. 3. Epidural Infections-This can be in the form of an epidural abscess, which can cause pressure inside of the spine, causing compression of the spinal cord with subsequent paralysis. This would require an emergency surgery to decompress, and there are no guarantees that the patient would recover from the paralysis. 4. Discitis-This is an infection of the intervertebral discs.  It occurs in about 1% of discography procedures.  It is difficult to treat and it may lead to surgery.        2. Pain: the needles have to go through skin and soft tissues, will cause soreness.       3. Damage to internal structures:  The nerves to be lesioned may be near blood vessels or    other nerves which can be potentially damaged.       4. Bleeding: Bleeding is more common if the patient is taking blood thinners such as  aspirin, Coumadin, Ticiid, Plavix, etc., or if he/she have some genetic predisposition  such as hemophilia. Bleeding into the spinal canal can cause compression of the spinal  cord with subsequent paralysis.  This would require an emergency surgery to  decompress and there are no guarantees that the patient would recover from the  paralysis.       5. Pneumothorax:  Puncturing of a lung is a possibility, every time a needle is introduced in  the area of the chest or upper back.  Pneumothorax refers to free air around the  collapsed lung(s), inside of the thoracic cavity (chest cavity).  Another two possible  complications related to a similar event would include: Hemothorax and Chylothorax.   These are variations of the Pneumothorax, where instead of air around the  collapsed  lung(s), you may have blood or chyle, respectively.       6. Spinal headaches: They may occur with any procedures in the area of the spine.       7. Persistent CSF (Cerebro-Spinal Fluid) leakage: This is a rare problem, but may occur  with prolonged intrathecal or epidural catheters either due to the formation of a fistulous  track or a dural tear.       8. Nerve damage: By working so close to the spinal cord, there is always a possibility of  nerve damage, which could be as serious as a permanent spinal cord injury with  paralysis.       9. Death:  Although rare, severe deadly allergic reactions known as "Anaphylactic  reaction" can occur to any of the medications used.      10. Worsening of the symptoms:  We can always make thing worse.  What are the chances of something like this happening? Chances of any of this occuring are extremely low.  By statistics, you have more of a chance of getting killed in a motor vehicle accident: while driving to the hospital than any of the above occurring .  Nevertheless, you should be aware that they are possibilities.  In general, it is similar to taking a shower.  Everybody knows that you can slip, hit your head and get killed.  Does that mean that you should not shower again?  Nevertheless always keep in mind that statistics do not mean anything if you happen to be on the wrong side of them.  Even if a procedure has a 1 (one) in a 1,000,000 (million) chance of going wrong, it you happen to be that one..Also, keep in mind that by statistics, you have more of a chance of having something go wrong when taking medications.  Who should not have this procedure? If you are on a blood thinning medication (e.g. Coumadin, Plavix, see list of "Blood Thinners"), or if you have an active infection going on, you should not have the procedure.  If you are taking any blood thinners, please inform your physician.  How should I prepare for this procedure?  Do not eat  or drink anything at least six hours prior to the procedure.  Bring a driver with you .  It cannot be a taxi.  Come accompanied by an adult that can drive you back, and that is strong enough to help you if your legs get weak or numb from the local anesthetic.  Take all of your medicines the morning of the procedure with just enough water to swallow them.  If you have diabetes, make sure that you are scheduled to have your procedure done first thing in the morning, whenever possible.  If you have diabetes, take only half of your insulin dose and notify our nurse that you have done so as soon  as you arrive at the clinic.  If you are diabetic, but only take blood sugar pills (oral hypoglycemic), then do not take them on the morning of your procedure.  You may take them after you have had the procedure.  Do not take aspirin or any aspirin-containing medications, at least eleven (11) days prior to the procedure.  They may prolong bleeding.  Wear loose fitting clothing that may be easy to take off and that you would not mind if it got stained with Betadine or blood.  Do not wear any jewelry or perfume  Remove any nail coloring.  It will interfere with some of our monitoring equipment.  NOTE: Remember that this is not meant to be interpreted as a complete list of all possible complications.  Unforeseen problems may occur.  BLOOD THINNERS The following drugs contain aspirin or other products, which can cause increased bleeding during surgery and should not be taken for 2 weeks prior to and 1 week after surgery.  If you should need take something for relief of minor pain, you may take acetaminophen which is found in Tylenol,m Datril, Anacin-3 and Panadol. It is not blood thinner. The products listed below are.  Do not take any of the products listed below in addition to any listed on your instruction sheet.  A.P.C or A.P.C with Codeine Codeine Phosphate Capsules #3 Ibuprofen Ridaura  ABC compound  Congesprin Imuran rimadil  Advil Cope Indocin Robaxisal  Alka-Seltzer Effervescent Pain Reliever and Antacid Coricidin or Coricidin-D  Indomethacin Rufen  Alka-Seltzer plus Cold Medicine Cosprin Ketoprofen S-A-C Tablets  Anacin Analgesic Tablets or Capsules Coumadin Korlgesic Salflex  Anacin Extra Strength Analgesic tablets or capsules CP-2 Tablets Lanoril Salicylate  Anaprox Cuprimine Capsules Levenox Salocol  Anexsia-D Dalteparin Magan Salsalate  Anodynos Darvon compound Magnesium Salicylate Sine-off  Ansaid Dasin Capsules Magsal Sodium Salicylate  Anturane Depen Capsules Marnal Soma  APF Arthritis pain formula Dewitt's Pills Measurin Stanback  Argesic Dia-Gesic Meclofenamic Sulfinpyrazone  Arthritis Bayer Timed Release Aspirin Diclofenac Meclomen Sulindac  Arthritis pain formula Anacin Dicumarol Medipren Supac  Analgesic (Safety coated) Arthralgen Diffunasal Mefanamic Suprofen  Arthritis Strength Bufferin Dihydrocodeine Mepro Compound Suprol  Arthropan liquid Dopirydamole Methcarbomol with Aspirin Synalgos  ASA tablets/Enseals Disalcid Micrainin Tagament  Ascriptin Doan's Midol Talwin  Ascriptin A/D Dolene Mobidin Tanderil  Ascriptin Extra Strength Dolobid Moblgesic Ticlid  Ascriptin with Codeine Doloprin or Doloprin with Codeine Momentum Tolectin  Asperbuf Duoprin Mono-gesic Trendar  Aspergum Duradyne Motrin or Motrin IB Triminicin  Aspirin plain, buffered or enteric coated Durasal Myochrisine Trigesic  Aspirin Suppositories Easprin Nalfon Trillsate  Aspirin with Codeine Ecotrin Regular or Extra Strength Naprosyn Uracel  Atromid-S Efficin Naproxen Ursinus  Auranofin Capsules Elmiron Neocylate Vanquish  Axotal Emagrin Norgesic Verin  Azathioprine Empirin or Empirin with Codeine Normiflo Vitamin E  Azolid Emprazil Nuprin Voltaren  Bayer Aspirin plain, buffered or children's or timed BC Tablets or powders Encaprin Orgaran Warfarin Sodium  Buff-a-Comp Enoxaparin Orudis Zorpin   Buff-a-Comp with Codeine Equegesic Os-Cal-Gesic   Buffaprin Excedrin plain, buffered or Extra Strength Oxalid   Bufferin Arthritis Strength Feldene Oxphenbutazone   Bufferin plain or Extra Strength Feldene Capsules Oxycodone with Aspirin   Bufferin with Codeine Fenoprofen Fenoprofen Pabalate or Pabalate-SF   Buffets II Flogesic Panagesic   Buffinol plain or Extra Strength Florinal or Florinal with Codeine Panwarfarin   Buf-Tabs Flurbiprofen Penicillamine   Butalbital Compound Four-way cold tablets Penicillin   Butazolidin Fragmin Pepto-Bismol   Carbenicillin Geminisyn Percodan   Carna Arthritis Reliever Geopen Persantine  Carprofen Gold's salt Persistin   Chloramphenicol Goody's Phenylbutazone   Chloromycetin Haltrain Piroxlcam   Clmetidine heparin Plaquenil   Cllnoril Hyco-pap Ponstel   Clofibrate Hydroxy chloroquine Propoxyphen         Before stopping any of these medications, be sure to consult the physician who ordered them.  Some, such as Coumadin (Warfarin) are ordered to prevent or treat serious conditions such as "deep thrombosis", "pumonary embolisms", and other heart problems.  The amount of time that you may need off of the medication may also vary with the medication and the reason for which you were taking it.  If you are taking any of these medications, please make sure you notify your pain physician before you undergo any procedures.         Epidural Steroid Injection Patient Information  Description: The epidural space surrounds the nerves as they exit the spinal cord.  In some patients, the nerves can be compressed and inflamed by a bulging disc or a tight spinal canal (spinal stenosis).  By injecting steroids into the epidural space, we can bring irritated nerves into direct contact with a potentially helpful medication.  These steroids act directly on the irritated nerves and can reduce swelling and inflammation which often leads to decreased pain.  Epidural  steroids may be injected anywhere along the spine and from the neck to the low back depending upon the location of your pain.   After numbing the skin with local anesthetic (like Novocaine), a small needle is passed into the epidural space slowly.  You may experience a sensation of pressure while this is being done.  The entire block usually last less than 10 minutes.  Conditions which may be treated by epidural steroids:   Low back and leg pain  Neck and arm pain  Spinal stenosis  Post-laminectomy syndrome  Herpes zoster (shingles) pain  Pain from compression fractures  Preparation for the injection:  1. Do not eat any solid food or dairy products within 8 hours of your appointment.  2. You may drink clear liquids up to 3 hours before appointment.  Clear liquids include water, black coffee, juice or soda.  No milk or cream please. 3. You may take your regular medication, including pain medications, with a sip of water before your appointment  Diabetics should hold regular insulin (if taken separately) and take 1/2 normal NPH dos the morning of the procedure.  Carry some sugar containing items with you to your appointment. 4. A driver must accompany you and be prepared to drive you home after your procedure.  5. Bring all your current medications with your. 6. An IV may be inserted and sedation may be given at the discretion of the physician.   7. A blood pressure cuff, EKG and other monitors will often be applied during the procedure.  Some patients may need to have extra oxygen administered for a short period. 8. You will be asked to provide medical information, including your allergies, prior to the procedure.  We must know immediately if you are taking blood thinners (like Coumadin/Warfarin)  Or if you are allergic to IV iodine contrast (dye). We must know if you could possible be pregnant.  Possible side-effects:  Bleeding from needle site  Infection (rare, may require  surgery)  Nerve injury (rare)  Numbness & tingling (temporary)  Difficulty urinating (rare, temporary)  Spinal headache ( a headache worse with upright posture)  Light -headedness (temporary)  Pain at injection site (several days)  Decreased  blood pressure (temporary)  Weakness in arm/leg (temporary)  Pressure sensation in back/neck (temporary)  Call if you experience:  Fever/chills associated with headache or increased back/neck pain.  Headache worsened by an upright position.  New onset weakness or numbness of an extremity below the injection site  Hives or difficulty breathing (go to the emergency room)  Inflammation or drainage at the infection site  Severe back/neck pain  Any new symptoms which are concerning to you  Please note:  Although the local anesthetic injected can often make your back or neck feel good for several hours after the injection, the pain will likely return.  It takes 3-7 days for steroids to work in the epidural space.  You may not notice any pain relief for at least that one week.  If effective, we will often do a series of three injections spaced 3-6 weeks apart to maximally decrease your pain.  After the initial series, we generally will wait several months before considering a repeat injection of the same type.  If you have any questions, please call [phone] [facility]

## 2017-01-27 ENCOUNTER — Ambulatory Visit: Payer: 59 | Attending: Pain Medicine | Admitting: Pain Medicine

## 2017-01-27 ENCOUNTER — Encounter: Payer: Self-pay | Admitting: Pain Medicine

## 2017-01-27 VITALS — BP 119/83 | HR 62 | Temp 97.2°F | Resp 18 | Ht 67.0 in | Wt 185.0 lb

## 2017-01-27 DIAGNOSIS — M25512 Pain in left shoulder: Secondary | ICD-10-CM | POA: Insufficient documentation

## 2017-01-27 DIAGNOSIS — E785 Hyperlipidemia, unspecified: Secondary | ICD-10-CM | POA: Insufficient documentation

## 2017-01-27 DIAGNOSIS — K2970 Gastritis, unspecified, without bleeding: Secondary | ICD-10-CM | POA: Diagnosis not present

## 2017-01-27 DIAGNOSIS — M549 Dorsalgia, unspecified: Secondary | ICD-10-CM

## 2017-01-27 DIAGNOSIS — G8929 Other chronic pain: Secondary | ICD-10-CM | POA: Diagnosis not present

## 2017-01-27 DIAGNOSIS — M4722 Other spondylosis with radiculopathy, cervical region: Secondary | ICD-10-CM

## 2017-01-27 DIAGNOSIS — M545 Low back pain: Secondary | ICD-10-CM

## 2017-01-27 DIAGNOSIS — F1190 Opioid use, unspecified, uncomplicated: Secondary | ICD-10-CM

## 2017-01-27 DIAGNOSIS — M25511 Pain in right shoulder: Secondary | ICD-10-CM | POA: Insufficient documentation

## 2017-01-27 DIAGNOSIS — M5030 Other cervical disc degeneration, unspecified cervical region: Secondary | ICD-10-CM | POA: Insufficient documentation

## 2017-01-27 DIAGNOSIS — N189 Chronic kidney disease, unspecified: Secondary | ICD-10-CM | POA: Insufficient documentation

## 2017-01-27 DIAGNOSIS — M5412 Radiculopathy, cervical region: Secondary | ICD-10-CM | POA: Insufficient documentation

## 2017-01-27 DIAGNOSIS — F329 Major depressive disorder, single episode, unspecified: Secondary | ICD-10-CM | POA: Diagnosis not present

## 2017-01-27 DIAGNOSIS — Z79891 Long term (current) use of opiate analgesic: Secondary | ICD-10-CM | POA: Diagnosis not present

## 2017-01-27 DIAGNOSIS — F419 Anxiety disorder, unspecified: Secondary | ICD-10-CM | POA: Insufficient documentation

## 2017-01-27 DIAGNOSIS — M9981 Other biomechanical lesions of cervical region: Secondary | ICD-10-CM

## 2017-01-27 DIAGNOSIS — M62838 Other muscle spasm: Secondary | ICD-10-CM

## 2017-01-27 DIAGNOSIS — I129 Hypertensive chronic kidney disease with stage 1 through stage 4 chronic kidney disease, or unspecified chronic kidney disease: Secondary | ICD-10-CM | POA: Diagnosis not present

## 2017-01-27 DIAGNOSIS — M4682 Other specified inflammatory spondylopathies, cervical region: Secondary | ICD-10-CM

## 2017-01-27 DIAGNOSIS — G894 Chronic pain syndrome: Secondary | ICD-10-CM | POA: Diagnosis not present

## 2017-01-27 DIAGNOSIS — M4802 Spinal stenosis, cervical region: Secondary | ICD-10-CM | POA: Insufficient documentation

## 2017-01-27 DIAGNOSIS — Z5181 Encounter for therapeutic drug level monitoring: Secondary | ICD-10-CM | POA: Diagnosis not present

## 2017-01-27 DIAGNOSIS — M4692 Unspecified inflammatory spondylopathy, cervical region: Secondary | ICD-10-CM

## 2017-01-27 DIAGNOSIS — Z79899 Other long term (current) drug therapy: Secondary | ICD-10-CM | POA: Diagnosis not present

## 2017-01-27 DIAGNOSIS — M542 Cervicalgia: Secondary | ICD-10-CM | POA: Diagnosis not present

## 2017-01-27 MED ORDER — HYDROCODONE-ACETAMINOPHEN 5-325 MG PO TABS: 1.0000 | ORAL_TABLET | Freq: Every day | ORAL | 0 refills | Status: DC | PRN

## 2017-01-27 MED ORDER — BACLOFEN 10 MG PO TABS: 10.0000 mg | ORAL_TABLET | Freq: Every day | ORAL | 0 refills | Status: DC

## 2017-01-27 NOTE — Progress Notes (Signed)
Patient's Name: Jose Washington  MRN: [id number]  Referring Provider: Kathrine Haddock, NP  DOB: 08/21/1966  PCP: Kathrine Haddock, NP  DOS: 01/27/2017  Note by: Kathlen Brunswick. Dossie Arbour, MD  Service setting: Ambulatory outpatient  Specialty: Interventional Pain Management  Location: [facility] (AMB) Pain Management Facility    Patient type: Established   Primary Reason(s) for Visit: Encounter for prescription drug management (Level of risk: moderate) CC: Back Pain (lower); Neck Pain (left); and Knee Pain (inner right)  HPI  Jose Washington is a 51 y.o. year old, male patient, who comes today for a medication management evaluation. He has Chronic neck pain (Location of Primary Source of Pain) (Bilateral) (L>R); Hyperlipidemia; Benign hypertension with chronic kidney disease; Gastritis; Chronic pain syndrome; Chronic low back pain (Location of Secondary source of pain) (Bilateral) (L>R); Chronic upper back pain (Location of Tertiary source of pain) (Bilateral) (L>R); Chronic shoulder pain (Bilateral) (L>R); Osteoarthritis of right AC (acromioclavicular) joint; Chronic sacroiliac joint pain (Bilateral) (L>R); Long term current use of opiate analgesic; Long term prescription opiate use; Opiate use (5 MME/Day); Muscle spasticity; Cervical DDD (C4-5, C5-6, C6-7 and C7-T1); Cervical foraminal stenosis (Bilateral); Cervical radiculitis (Bilateral) (L>R); and Cervical spondylitis with radiculitis (HCC) on his problem list. His primarily concern today is the Back Pain (lower); Neck Pain (left); and Knee Pain (inner right)  Pain Assessment: Self-Reported Pain Score: 2 /10             Reported level is compatible with observation.       Pain Type: Chronic pain Pain Location: Back Pain Orientation: Right, Left Pain Descriptors / Indicators: Constant, Nagging Pain Frequency: Constant  Jose Washington was last scheduled for an appointment on 01/04/2017 for medication management. During today's appointment we reviewed Jose Washington  chronic pain status, as well as his outpatient medication regimen.  The patient  reports that he does not use drugs. His body mass index is 28.98 kg/m.  Further details on both, my assessment(s), as well as the proposed treatment plan, please see below.  Controlled Substance Pharmacotherapy Assessment REMS (Risk Evaluation and Mitigation Strategy)  Analgesic: Hydrocodone/APAP 5/325 one daily MME/day:78m/day GIgnatius Specking RN  01/27/2017  8:57 AM  Sign at close encounter Nursing Pain Medication Assessment:  Safety precautions to be maintained throughout the outpatient stay will include: orient to surroundings, keep bed in low position, maintain call bell within reach at all times, provide assistance with transfer out of bed and ambulation.  Medication Inspection Compliance: Pill count conducted under aseptic conditions, in front of the patient. Neither the pills nor the bottle was removed from the patient's sight at any time. Once count was completed pills were immediately returned to the patient in their original bottle. Pill Count: 2 of 10 pills remain Bottle Appearance: Standard pharmacy container. Clearly labeled. Medication: See above Filled Date: 03 / 14 / 2017   Pharmacokinetics: Liberation and absorption (onset of action): WNL Distribution (time to peak effect): WNL Metabolism and excretion (duration of action): WNL         Pharmacodynamics: Desired effects: Analgesia: Jose Washington >50% benefit. Functional ability: Patient reports that medication allows him to accomplish basic ADLs Clinically meaningful improvement in function (CMIF): Sustained CMIF goals met Perceived effectiveness: Described as relatively effective, allowing for increase in activities of daily living (ADL) Undesirable effects: Side-effects or Adverse reactions: None reported Monitoring: [place] PMP: Online review of the past 132-montheriod conducted. Compliant with practice rules and regulations List  of all UDS test(s) done:  Lab Results  Component Value Date   SUMMARY FINAL 11/03/2016   Last UDS on record: No results found for: TOXASSSELUR UDS interpretation: Compliant          Medication Assessment Form: Reviewed. Patient indicates being compliant with therapy Treatment compliance: Compliant Risk Assessment Profile: Aberrant behavior: See prior evaluations. None observed or detected today Comorbid factors increasing risk of overdose: See prior notes. No additional risks detected today Risk of substance use disorder (SUD): Low Opioid Risk Tool (ORT) Total Score: 3  Interpretation Table:  Score <3 = Low Risk for SUD  Score between 4-7 = Moderate Risk for SUD  Score >8 = High Risk for Opioid Abuse   Risk Mitigation Strategies:  Patient Counseling: Covered Patient-Prescriber Agreement (PPA): Present and active  Notification to other healthcare providers: Done  Pharmacologic Plan: No change in therapy, at this time  Laboratory Chemistry  Inflammation Markers Lab Results  Component Value Date   CRP 1.4 (H) 11/12/2016   ESRSEDRATE 3 11/12/2016   (CRP: Acute Phase) (ESR: Chronic Phase) Renal Function Markers Lab Results  Component Value Date   BUN 11 11/12/2016   CREATININE 0.98 11/12/2016   GFRAA >60 11/12/2016   GFRNONAA >60 11/12/2016   Hepatic Function Markers Lab Results  Component Value Date   AST 43 (H) 11/12/2016   ALT 54 11/12/2016   ALBUMIN 4.3 11/12/2016   ALKPHOS 55 11/12/2016   Electrolytes Lab Results  Component Value Date   NA 138 11/12/2016   K 4.2 11/12/2016   CL 105 11/12/2016   CALCIUM 9.0 11/12/2016   MG 2.0 11/12/2016   Neuropathy Markers Lab Results  Component Value Date   VITAMINB12 820 11/12/2016   Bone Pathology Markers Lab Results  Component Value Date   ALKPHOS 55 11/12/2016   25OHVITD1 23 (L) 11/12/2016   25OHVITD2 <1.0 11/12/2016   25OHVITD3 23 11/12/2016   CALCIUM 9.0 11/12/2016   Coagulation Parameters Lab  Results  Component Value Date   PLT 405 (H) 02/18/2016   Cardiovascular Markers Lab Results  Component Value Date   HCT 45.3 02/18/2016   Note: Lab results reviewed.  Recent Diagnostic Imaging Review  Dg Cervical Spine Complete Result Date: 11/12/2016 CLINICAL DATA:  Chronic neck pain. EXAM: CERVICAL SPINE - COMPLETE 4+ VIEW COMPARISON:  None. FINDINGS: No fracture or significant spondylolisthesis is noted. No prevertebral soft tissue swelling is noted. Mild to moderate degenerative disc disease is noted at C4-5, C5-6, C6-7 and C7-T1. Mild bilateral neural foraminal stenosis is noted on the right at C5-6 and C6-7, and on the left at C4-5, C5-6, C6-7 and C7-T1 secondary to uncovertebral spurring. IMPRESSION: Multilevel degenerative disc disease. Bilateral neural foraminal stenosis is noted secondary to uncovertebral spurring. No acute abnormality seen in the cervical spine. Electronically Signed   By: Marijo Conception, M.D.   On: 11/12/2016 15:30   Dg Lumbar Spine Complete W/bend Result Date: 11/12/2016 CLINICAL DATA:  Non radiating low back pain.  No known injury. EXAM: LUMBAR SPINE - COMPLETE WITH BENDING VIEWS COMPARISON:  None. FINDINGS: Vertebral body height is maintained. Trace retrolisthesis L3 on L4 is identified. There is some loss of disc space height at L3-4. Facet arthropathy is seen at L4-5 and L5-S1. Range of motion appears limited with flexion and extension but no pathologic motion is identified. IMPRESSION: No acute abnormality. Mild appearing lower lumbar degenerative change. Limited range of motion without pathologic motion. Electronically Signed   By: Inge Rise M.D.   On: 11/12/2016 15:29   Dg Si  Joints Result Date: 11/12/2016 CLINICAL DATA:  Chronic bilateral low back pain.  No known injury. EXAM: BILATERAL SACROILIAC JOINTS - 3+ VIEW COMPARISON:  None. FINDINGS: The sacroiliac joint spaces are maintained and there is no evidence of arthropathy. No other bone abnormalities  are seen. IMPRESSION: Negative exam. Electronically Signed   By: Inge Rise M.D.   On: 11/12/2016 15:29   Dg Shoulder Right Result Date: 11/12/2016 CLINICAL DATA:  Bilateral shoulder pain.  No known injury. EXAM: RIGHT SHOULDER - 2+ VIEW COMPARISON:  None. FINDINGS: There is no evidence of fracture or dislocation. Mild to moderate acromioclavicular osteoarthritis is seen. Soft tissues are unremarkable. IMPRESSION: Mild to moderate acromioclavicular osteoarthritis. Otherwise negative. Electronically Signed   By: Inge Rise M.D.   On: 11/12/2016 15:30   Dg Shoulder Left Result Date: 11/12/2016 CLINICAL DATA:  Bilateral shoulder pain, chronic.  No known injury. EXAM: LEFT SHOULDER - 2+ VIEW COMPARISON:  None. FINDINGS: There is no evidence of fracture or dislocation. Mild acromioclavicular degenerative change is seen. Soft tissues are unremarkable. IMPRESSION: Mild acromioclavicular osteoarthritis.  Otherwise negative. Electronically Signed   By: Inge Rise M.D.   On: 11/12/2016 15:29   Note: Imaging results reviewed.          Meds  The patient has a current medication list which includes the following prescription(s): acetaminophen, atenolol, atorvastatin, baclofen, buspirone, cholecalciferol, chondroitin sulfate, desloratadine-pseudoephedrine, diphenhydramine hcl, duloxetine, fenofibrate, fenoprofen, ferrous sulfate, fluticasone, glucosamine-chondroit-vit c-mn, hydrocodone-acetaminophen, hydrocodone-acetaminophen, hydrocodone-acetaminophen, melatonin, methylsulfonylmethane, multivitamin, omeprazole, ondansetron, potassium, turmeric, and vitamin b-12.  Current Outpatient Prescriptions on File Prior to Visit  Medication Sig  . acetaminophen (TYLENOL) 500 MG tablet Take 500 mg by mouth every 6 (six) hours as needed.  Marland Kitchen atenolol (TENORMIN) 50 MG tablet Take 1 tablet (50 mg total) by mouth daily.  Marland Kitchen atorvastatin (LIPITOR) 40 MG tablet Take 1 tablet (40 mg total) by mouth daily.  .  busPIRone (BUSPAR) 30 MG tablet Take 1 tablet (30 mg total) by mouth 2 (two) times daily.  . Chondroitin Sulfate 400 MG CAPS Take by mouth 2 (two) times daily.  . DULoxetine (CYMBALTA) 30 MG capsule TAKE 1 CAPSULE (30 MG TOTAL) BY MOUTH 2 (TWO) TIMES DAILY.  . fenofibrate 160 MG tablet Take 1 tablet (160 mg total) by mouth daily.  . ferrous sulfate (QC FERROUS SULFATE) 325 (65 FE) MG tablet Take 325 mg by mouth daily with breakfast.  . fluticasone (FLONASE) 50 MCG/ACT nasal spray Place 2 sprays into both nostrils daily.  . Glucosamine-Chondroit-Vit C-Mn (GLUCOSAMINE 1500 COMPLEX PO) Take by mouth 2 (two) times daily.  . Melatonin 3 MG CAPS Take by mouth 3 (three) times a week.  . Methylsulfonylmethane (MSM PO) Take by mouth 2 (two) times daily.  . Multiple Vitamin (MULTIVITAMIN) tablet Take 1 tablet by mouth daily.  Marland Kitchen omeprazole (PRILOSEC) 20 MG capsule Take 1 capsule (20 mg total) by mouth daily.  . ondansetron (ZOFRAN) 8 MG tablet Take 1 tablet (8 mg total) by mouth 2 (two) times daily.  . TURMERIC PO Take by mouth daily.   . vitamin B-12 (CYANOCOBALAMIN) 1000 MCG tablet Take 1,000 mcg by mouth daily.   No current facility-administered medications on file prior to visit.    ROS  Constitutional: Denies any fever or chills Gastrointestinal: No reported hemesis, hematochezia, vomiting, or acute GI distress Musculoskeletal: Denies any acute onset joint swelling, redness, loss of ROM, or weakness Neurological: No reported episodes of acute onset apraxia, aphasia, dysarthria, agnosia, amnesia, paralysis, loss of coordination, or  loss of consciousness  Allergies  Jose Washington has No Known Allergies.  [place]  Drug: Jose Washington  reports that he does not use drugs. Alcohol:  reports that he does not drink alcohol. Tobacco:  reports that he has never smoked. He has never used smokeless tobacco. Medical:  has a past medical history of Allergy; Anxiety; Chronic duodenal ulcer with hemorrhage (2012);  Chronic neck pain; Depression; Hyperlipidemia; Hypertension; and Microscopic hematuria. Family: family history includes Allergies in his brother; Cancer in his maternal grandfather and mother; Dementia in his father and paternal grandfather; Depression in his father; Diabetes in his mother; Mental illness in his father; Stroke in his paternal grandfather.  Past Surgical History:  Procedure Laterality Date  . bLEEDING ULCER  2012  . eye muscle repair  1972 and 1975   Constitutional Exam  General appearance: Well nourished, well developed, and well hydrated. In no apparent acute distress Vitals:   01/27/17 0833  BP: 119/83  Pulse: 62  Resp: 18  Temp: 97.2 F (36.2 C)  TempSrc: Oral  SpO2: 99%  Weight: 185 lb (83.9 kg)  Height: '5\' 7"'  (1.702 m)   BMI Assessment: Estimated body mass index is 28.98 kg/m as calculated from the following:   Height as of this encounter: '5\' 7"'  (1.702 m).   Weight as of this encounter: 185 lb (83.9 kg).  BMI interpretation table: BMI level Category Range association with higher incidence of chronic pain  <18 kg/m2 Underweight   18.5-24.9 kg/m2 Ideal body weight   25-29.9 kg/m2 Overweight Increased incidence by 20%  30-34.9 kg/m2 Obese (Class I) Increased incidence by 68%  35-39.9 kg/m2 Severe obesity (Class II) Increased incidence by 136%  >40 kg/m2 Extreme obesity (Class III) Increased incidence by 254%   BMI Readings from Last 4 Encounters:  01/27/17 28.98 kg/m  01/04/17 28.82 kg/m  11/03/16 29.70 kg/m  08/11/16 28.32 kg/m   Wt Readings from Last 4 Encounters:  01/27/17 185 lb (83.9 kg)  01/04/17 184 lb (83.5 kg)  11/03/16 184 lb (83.5 kg)  08/11/16 179 lb 3.2 oz (81.3 kg)  Psych/Mental status: Alert, oriented x 3 (person, place, & time)       Eyes: PERLA Respiratory: No evidence of acute respiratory distress  Cervical Spine Exam  Inspection: No masses, redness, or swelling Alignment: Symmetrical Functional ROM: Diminished  ROM Stability: No instability detected Muscle strength & Tone: Functionally intact Sensory: Movement-associated pain Palpation: Complains of area being tender to palpation  Upper Extremity (UE) Exam    Side: Right upper extremity  Side: Left upper extremity  Inspection: No masses, redness, swelling, or asymmetry. No contractures  Inspection: No masses, redness, swelling, or asymmetry. No contractures  Functional ROM: Decreased ROM for shoulder  Functional ROM: Decreased ROM for shoulder  Muscle strength & Tone: Functionally intact  Muscle strength & Tone: Functionally intact  Sensory: Unimpaired  Sensory: Unimpaired  Palpation: No palpable anomalies  Palpation: No palpable anomalies  Specialized Test(s): Deferred         Specialized Test(s): Deferred          Thoracic Spine Exam  Inspection: No masses, redness, or swelling Alignment: Symmetrical Functional ROM: Unrestricted ROM Stability: No instability detected Sensory: Unimpaired Muscle strength & Tone: No palpable anomalies  Lumbar Spine Exam  Inspection: No masses, redness, or swelling Alignment: Symmetrical Functional ROM: Unrestricted ROM Stability: No instability detected Muscle strength & Tone: Functionally intact Sensory: Unimpaired Palpation: No palpable anomalies Provocative Tests: Lumbar Hyperextension and rotation test: evaluation deferred today  Patrick's Maneuver: evaluation deferred today              Gait & Posture Assessment  Ambulation: Unassisted Gait: Relatively normal for age and body habitus Posture: WNL   Lower Extremity Exam    Side: Right lower extremity  Side: Left lower extremity  Inspection: No masses, redness, swelling, or asymmetry. No contractures  Inspection: No masses, redness, swelling, or asymmetry. No contractures  Functional ROM: Unrestricted ROM          Functional ROM: Unrestricted ROM          Muscle strength & Tone: Functionally intact  Muscle strength & Tone: Functionally  intact  Sensory: Unimpaired  Sensory: Unimpaired  Palpation: No palpable anomalies  Palpation: No palpable anomalies   Assessment  Primary Diagnosis & Pertinent Problem List: The primary encounter diagnosis was Chronic neck pain (Location of Primary Source of Pain) (Bilateral) (L>R). Diagnoses of Chronic low back pain (Location of Secondary source of pain) (Bilateral) (L>R), Chronic upper back pain (Location of Tertiary source of pain) (Bilateral) (L>R), Chronic pain syndrome, Long term prescription opiate use, Opiate use (5 MME/Day), Cervical DDD (C4-5, C5-6, C6-7 and C7-T1), Cervical foraminal stenosis (Bilateral), Cervical radiculitis (Bilateral) (L>R), Cervical spondylitis with radiculitis ([place]), and Muscle spasticity were also pertinent to this visit.  Status Diagnosis  Unimproved Controlled Unimproved 1. Chronic neck pain (Location of Primary Source of Pain) (Bilateral) (L>R)   2. Chronic low back pain (Location of Secondary source of pain) (Bilateral) (L>R)   3. Chronic upper back pain (Location of Tertiary source of pain) (Bilateral) (L>R)   4. Chronic pain syndrome   5. Long term prescription opiate use   6. Opiate use (5 MME/Day)   7. Cervical DDD (C4-5, C5-6, C6-7 and C7-T1)   8. Cervical foraminal stenosis (Bilateral)   9. Cervical radiculitis (Bilateral) (L>R)   10. Cervical spondylitis with radiculitis ([place])   11. Muscle spasticity      Plan of Care  Pharmacotherapy (Medications Ordered): Meds ordered this encounter  Medications  . HYDROcodone-acetaminophen (NORCO/VICODIN) 5-325 MG tablet    Sig: Take 1 tablet by mouth daily as needed for moderate pain.    Dispense:  10 tablet    Refill:  0    Fill one day early if pharmacy is closed on scheduled refill date. Do not fill until: 02/03/17 To last until: 03/05/17  . HYDROcodone-acetaminophen (NORCO/VICODIN) 5-325 MG tablet    Sig: Take 1 tablet by mouth daily as needed for moderate pain.    Dispense:  10 tablet     Refill:  0    Fill one day early if pharmacy is closed on scheduled refill date. Do not fill until: 03/05/17 To last until: 04/04/17  . HYDROcodone-acetaminophen (NORCO/VICODIN) 5-325 MG tablet    Sig: Take 1 tablet by mouth daily as needed for moderate pain.    Dispense:  10 tablet    Refill:  0    Fill one day early if pharmacy is closed on scheduled refill date. Do not fill until: 04/04/17 To last until: 05/04/17   New Prescriptions   No medications on file   Medications administered today: Jose Washington had no medications administered during this visit. Lab-work, procedure(s), and/or referral(s): Orders Placed This Encounter  Procedures  . Cervical Epidural Injection  . Cervical Epidural Injection  . ToxASSURE Select 13 (MW), Urine   Imaging and/or referral(s): None  Interventional therapies: Planned, scheduled, and/or pending:   Diagnostic left CESI under fluoroscopy   Considering:  Diagnostic bilateral lumbar facet block  Possible bilateral lumbar facet RFA Diagnostic right-sided cervical epidural steroid injection  Diagnostic bilateral cervical facet block  Possible bilateral cervical facet RFA Diagnostic bilateral sacroiliac joint block  Possible bilateral sacroiliac joint RFA  Diagnostic trigger point injections    Palliative PRN treatment(s):   Diagnostic left CESI under fluoroscopy   Provider-requested follow-up: Return in about 3 months (around 04/29/2017) for (MD) Med-Mgmt, (Nurse Practitioner) Med-Mgmt, in addition, procedure (ASAA).  Future Appointments Date Time Provider [place]  02/21/2017 10:00 AM Kathrine Haddock, NP [facility] None  04/28/2017 9:20 AM [place], NP [facility] [place] None   Primary Care Physician: Kathrine Haddock, NP Location: [facility] Outpatient Pain Management Facility Note by: Kathlen Brunswick. Dossie Arbour, M.D, DABA, DABAPM, DABPM, DABIPP, FIPP Date: 01/27/2017; Time: 10:25 AM  Pain Score Disclaimer: We use the NRS-11 scale. This is a  self-reported, subjective measurement of pain severity with only modest accuracy. It is used primarily to identify changes within a particular patient. It must be understood that outpatient pain scales are significantly less accurate that those used for research, where they can be applied under ideal controlled circumstances with minimal exposure to variables. In reality, the score is likely to be a combination of pain intensity and pain affect, where pain affect describes the degree of emotional arousal or changes in action readiness caused by the sensory experience of pain. Factors such as social and work situation, setting, emotional state, anxiety levels, expectation, and prior pain experience may influence pain perception and show large inter-individual differences that may also be affected by time variables.  Patient instructions provided during this appointment: Patient Instructions  Preparing for Procedure with Sedation Instructions: . Oral Intake: Do not eat or drink anything for at least 8 hours prior to your procedure. . Transportation: Public transportation is not allowed. Bring an adult driver. The driver must be physically present in our waiting room before any procedure can be started. Marland Kitchen Physical Assistance: Bring an adult physically capable of assisting you, in the event you need help. This adult should keep you company at home for at least 6 hours after the procedure. . Blood Pressure Medicine: Take your blood pressure medicine with a sip of water the morning of the procedure. . Blood thinners:  . Diabetics on insulin: Notify the staff so that you can be scheduled 1st case in the morning. If your diabetes requires high dose insulin, take only  of your normal insulin dose the morning of the procedure and notify the staff that you have done so. . Preventing infections: Shower with an antibacterial soap the morning of your procedure. . Build-up your immune system: Take 1000 mg of Vitamin  C with every meal (3 times a day) the day prior to your procedure. Marland Kitchen Antibiotics: Inform the staff if you have a condition or reason that requires you to take antibiotics before dental procedures. . Pregnancy: If you are pregnant, call and cancel the procedure. . Sickness: If you have a cold, fever, or any active infections, call and cancel the procedure. . Arrival: You must be in the facility at least 30 minutes prior to your scheduled procedure. . Children: Do not bring children with you. . Dress appropriately: Bring dark clothing that you would not mind if they get stained. . Valuables: Do not bring any jewelry or valuables. Procedure appointments are reserved for interventional treatments only. Marland Kitchen No Prescription Refills. . No medication changes will be discussed during procedure appointments. . No disability issues will be  discussed.  ____________________________________________________________________________________________

## 2017-01-27 NOTE — Patient Instructions (Signed)

## 2017-01-27 NOTE — Progress Notes (Signed)
Nursing Pain Medication Assessment:  Safety precautions to be maintained throughout the outpatient stay will include: orient to surroundings, keep bed in low position, maintain call bell within reach at all times, provide assistance with transfer out of bed and ambulation.  Medication Inspection Compliance: Pill count conducted under aseptic conditions, in front of the patient. Neither the pills nor the bottle was removed from the patient's sight at any time. Once count was completed pills were immediately returned to the patient in their original bottle. Pill Count: 2 of 10 pills remain Bottle Appearance: Standard pharmacy container. Clearly labeled. Medication: See above Filled Date: 03 / 14 / 2017

## 2017-01-31 LAB — TOXASSURE SELECT 13 (MW), URINE

## 2017-02-03 ENCOUNTER — Other Ambulatory Visit: Payer: Self-pay | Admitting: Unknown Physician Specialty

## 2017-02-08 ENCOUNTER — Encounter: Payer: Self-pay | Admitting: Pain Medicine

## 2017-02-08 ENCOUNTER — Other Ambulatory Visit: Payer: Self-pay | Admitting: Pain Medicine

## 2017-02-08 DIAGNOSIS — E559 Vitamin D deficiency, unspecified: Secondary | ICD-10-CM | POA: Insufficient documentation

## 2017-02-08 MED ORDER — VITAMIN D (ERGOCALCIFEROL) 1.25 MG (50000 UNIT) PO CAPS: ORAL_CAPSULE | ORAL | 0 refills | Status: DC

## 2017-02-08 MED ORDER — VITAMIN D3 50 MCG (2000 UT) PO CAPS: ORAL_CAPSULE | ORAL | 99 refills | Status: DC

## 2017-02-08 NOTE — Progress Notes (Signed)
Test: CRP (C-reactive protein) levels Finding(s): Elevated  Normal Level(s): Less than <1.0 mg/dL. Clinical significance: C-reactive protein (CRP) is produced by the liver. The level of CRP rises when there is inflammation throughout the body. CRP goes up in response to inflammation. High levels suggests the presence of chronic inflammation but do not identify its location or cause. Drops of previously elevated levels suggest that the inflammation or infection is subsiding and/or responding to treatment. Signs and symptoms may include: Signs or symptoms, if present, would depend on the underlying inflammatory condition that is the cause of the elevated CRP level. Possible causes:  - Most common: High levels have been observed in obese patients, individuals with bacterial infections, chronic inflammation, or flare-ups of inflammatory conditions. Patient Recommendation(s): Unless contraindicated, consider the use of anti-inflammatory diet and medications. (visit http://inflammationfactor.com/ ) ___________________________________________________________________________________ 

## 2017-02-08 NOTE — Progress Notes (Signed)
-   Normal levels of AST are between 5 and 40 U/L. Pregnancy, a muscle injection, or even strenuous exercise may increase AST levels. Acute burns, surgery, and seizures may raise AST levels as well. Very high levels of AST (> 10 X normal) are usually due to acute hepatitis. Levels > 100 X normal can be seen with liver exposure to hepatotoxic substances. Moderate increases may be seen in other diseases of the liver, especially when the bile ducts are blocked, or with cirrhosis or certain cancers of the liver. AST may also increase after heart attacks and with muscle injury, usually to a much greater degree than ALT. In most types of liver disease, the ALT level is higher than AST and the AST/ALT ratio will be low (less than 1). With heart or muscle injury, AST is often much higher than ALT (often 3-5 times as high) and levels tend to stay higher than ALT for longer than with liver injury. ___________________________________________________________________________________

## 2017-02-08 NOTE — Progress Notes (Signed)

## 2017-02-10 ENCOUNTER — Other Ambulatory Visit: Payer: Self-pay | Admitting: Unknown Physician Specialty

## 2017-02-11 ENCOUNTER — Other Ambulatory Visit: Payer: Self-pay | Admitting: Family Medicine

## 2017-02-21 ENCOUNTER — Ambulatory Visit (INDEPENDENT_AMBULATORY_CARE_PROVIDER_SITE_OTHER): Payer: 59 | Admitting: Unknown Physician Specialty

## 2017-02-21 ENCOUNTER — Encounter: Payer: Self-pay | Admitting: Unknown Physician Specialty

## 2017-02-21 VITALS — BP 119/74 | HR 70 | Temp 98.6°F | Ht 66.6 in | Wt 180.2 lb

## 2017-02-21 DIAGNOSIS — I10 Essential (primary) hypertension: Secondary | ICD-10-CM | POA: Diagnosis not present

## 2017-02-21 DIAGNOSIS — E7800 Pure hypercholesterolemia, unspecified: Secondary | ICD-10-CM

## 2017-02-21 DIAGNOSIS — M545 Low back pain, unspecified: Secondary | ICD-10-CM

## 2017-02-21 DIAGNOSIS — E559 Vitamin D deficiency, unspecified: Secondary | ICD-10-CM

## 2017-02-21 DIAGNOSIS — Z0000 Encounter for general adult medical examination without abnormal findings: Secondary | ICD-10-CM

## 2017-02-21 DIAGNOSIS — M9981 Other biomechanical lesions of cervical region: Secondary | ICD-10-CM

## 2017-02-21 DIAGNOSIS — M4802 Spinal stenosis, cervical region: Secondary | ICD-10-CM

## 2017-02-21 DIAGNOSIS — M542 Cervicalgia: Secondary | ICD-10-CM | POA: Diagnosis not present

## 2017-02-21 DIAGNOSIS — G8929 Other chronic pain: Secondary | ICD-10-CM

## 2017-02-21 LAB — UA/M W/RFLX CULTURE, ROUTINE
Bilirubin, UA: NEGATIVE
Glucose, UA: NEGATIVE
KETONES UA: NEGATIVE
LEUKOCYTES UA: NEGATIVE
Nitrite, UA: NEGATIVE
PH UA: 6 (ref 5.0–7.5)
Protein, UA: NEGATIVE
SPEC GRAV UA: 1.025 (ref 1.005–1.030)
Urobilinogen, Ur: 0.2 mg/dL (ref 0.2–1.0)

## 2017-02-21 LAB — MICROSCOPIC EXAMINATION: BACTERIA UA: NONE SEEN

## 2017-02-21 MED ORDER — ATORVASTATIN CALCIUM 40 MG PO TABS: 40.0000 mg | ORAL_TABLET | Freq: Every day | ORAL | 1 refills | Status: DC

## 2017-02-21 MED ORDER — ATENOLOL 50 MG PO TABS: 50.0000 mg | ORAL_TABLET | Freq: Every day | ORAL | 1 refills | Status: DC

## 2017-02-21 MED ORDER — FENOFIBRATE 160 MG PO TABS: 160.0000 mg | ORAL_TABLET | Freq: Every day | ORAL | 1 refills | Status: DC

## 2017-02-21 MED ORDER — ONDANSETRON HCL 8 MG PO TABS: 8.0000 mg | ORAL_TABLET | Freq: Two times a day (BID) | ORAL | 3 refills | Status: DC

## 2017-02-21 MED ORDER — DULOXETINE HCL 30 MG PO CPEP: 30.0000 mg | ORAL_CAPSULE | Freq: Two times a day (BID) | ORAL | 1 refills | Status: DC

## 2017-02-21 MED ORDER — BUSPIRONE HCL 30 MG PO TABS: 30.0000 mg | ORAL_TABLET | Freq: Two times a day (BID) | ORAL | 1 refills | Status: DC

## 2017-02-21 NOTE — Progress Notes (Signed)
BP 119/74   Pulse 70   Temp 98.6 F (37 C)   Ht 5' 6.6" (1.692 m)   Wt 180 lb 3.2 oz (81.7 kg)   SpO2 98%   BMI 28.56 kg/m    Subjective:    Patient ID: Jose Washington, male    DOB: 1966-01-22, 51 y.o.   MRN: [id number]  HPI: Jose Washington is a 51 y.o. male  Chief Complaint  Patient presents with  . Annual Exam   Pain management Seeing Dr. Consuela Mimes.  Started on Vitamin D and feeling better.  He is managed there with controlled substances and Baclofen but I continue to prescribe the other.    Depression Doing well on present meds Depression screen [facility] Dt 2/9 01/04/2017 11/03/2016 08/11/2016 02/18/2016 11/18/2015  Decreased Interest 0 0 0 0 0  Down, Depressed, Hopeless 0 0 0 0 0  PHQ - 2 Score 0 0 0 0 0  Altered sleeping - - 0 - -  Tired, decreased energy - - 0 - -  Change in appetite - - 0 - -  Feeling bad or failure about yourself  - - 0 - -  Trouble concentrating - - 0 - -  Moving slowly or fidgety/restless - - 0 - -  Suicidal thoughts - - 0 - -  PHQ-9 Score - - 0 - -   Hypertension Using medications without difficulty Average home BPs Not checking   No problems or lightheadedness No chest pain with exertion or shortness of breath No Edema  Hyperlipidemia Using medications without problems: No Muscle aches  Diet compliance/Exercise: About the same and feels like he needs more exercise.  But, on his feet at work.     Relevant past medical, surgical, family and social history reviewed and updated as indicated. Interim medical history since our last visit reviewed. Allergies and medications reviewed and updated.  Review of Systems  Constitutional: Negative.   HENT: Negative.   Eyes: Negative.   Respiratory: Negative.   Cardiovascular: Negative.   Gastrointestinal: Negative.   Endocrine: Negative.   Genitourinary: Negative.   Skin: Negative.   Allergic/Immunologic: Negative.   Neurological: Negative.   Hematological: Negative.   Psychiatric/Behavioral:  Negative.     Per HPI unless specifically indicated above     Objective:    BP 119/74   Pulse 70   Temp 98.6 F (37 C)   Ht 5' 6.6" (1.692 m)   Wt 180 lb 3.2 oz (81.7 kg)   SpO2 98%   BMI 28.56 kg/m   Wt Readings from Last 3 Encounters:  02/21/17 180 lb 3.2 oz (81.7 kg)  01/27/17 185 lb (83.9 kg)  01/04/17 184 lb (83.5 kg)    Physical Exam  Constitutional: He is oriented to person, place, and time. He appears well-developed and well-nourished.  HENT:  Head: Normocephalic.  Right Ear: Tympanic membrane, external ear and ear canal normal.  Left Ear: Tympanic membrane, external ear and ear canal normal.  Mouth/Throat: Uvula is midline, oropharynx is clear and moist and mucous membranes are normal.  Eyes: Pupils are equal, round, and reactive to light.  Cardiovascular: Normal rate, regular rhythm and normal heart sounds.  Exam reveals no gallop and no friction rub.   No murmur heard. Pulmonary/Chest: Effort normal and breath sounds normal. No respiratory distress.  Abdominal: Soft. Bowel sounds are normal. He exhibits no distension. There is no tenderness.  Genitourinary: Prostate normal. No penile tenderness.  Musculoskeletal: Normal range of motion.  Neurological: He  is alert and oriented to person, place, and time. He has normal reflexes.  Skin: Skin is warm and dry.  Psychiatric: He has a normal mood and affect. His behavior is normal. Judgment and thought content normal.      Assessment & Plan:   Problem List Items Addressed This Visit      Unprioritized   Cervical foraminal stenosis (Bilateral) (Chronic)   Chronic low back pain (Location of Secondary source of pain) (Bilateral) (L>R) (Chronic)   Chronic neck pain (Location of Primary Source of Pain) (Bilateral) (L>R) - Primary (Chronic)   Essential hypertension, benign    Stable, continue present medications.        Relevant Orders   Comprehensive metabolic panel   Vitamin D insufficiency    Taking  supplements       Other Visit Diagnoses    Routine general medical examination at a health care facility       Relevant Orders   CBC with Differential/Platelet   Cologuard   TSH   PSA   UA/M w/rflx Culture, Routine   Hypercholesteremia       Relevant Orders   Lipid Panel w/o Chol/HDL Ratio      Pain management assistance with Dr Consuela Mimes who is prescribing controlled substances  Follow up plan: Return in about 6 months (around 08/23/2017).

## 2017-02-21 NOTE — Assessment & Plan Note (Signed)
Stable, continue present medications.   

## 2017-02-21 NOTE — Assessment & Plan Note (Signed)
Taking supplements

## 2017-02-22 ENCOUNTER — Telehealth: Payer: Self-pay

## 2017-02-22 ENCOUNTER — Telehealth: Payer: Self-pay | Admitting: Pain Medicine

## 2017-02-22 ENCOUNTER — Telehealth: Payer: Self-pay | Admitting: *Deleted

## 2017-02-22 ENCOUNTER — Encounter: Payer: Self-pay | Admitting: Unknown Physician Specialty

## 2017-02-22 LAB — CBC WITH DIFFERENTIAL/PLATELET
Basophils Absolute: 0.1 10*3/uL (ref 0.0–0.2)
Basos: 1 %
EOS (ABSOLUTE): 0.5 10*3/uL — ABNORMAL HIGH (ref 0.0–0.4)
EOS: 8 %
HEMATOCRIT: 44.5 % (ref 37.5–51.0)
HEMOGLOBIN: 15.4 g/dL (ref 13.0–17.7)
Immature Grans (Abs): 0 10*3/uL (ref 0.0–0.1)
Immature Granulocytes: 0 %
LYMPHS ABS: 1.8 10*3/uL (ref 0.7–3.1)
Lymphs: 26 %
MCH: 30.7 pg (ref 26.6–33.0)
MCHC: 34.6 g/dL (ref 31.5–35.7)
MCV: 89 fL (ref 79–97)
MONOCYTES: 8 %
Monocytes Absolute: 0.6 10*3/uL (ref 0.1–0.9)
NEUTROS ABS: 3.9 10*3/uL (ref 1.4–7.0)
Neutrophils: 57 %
Platelets: 389 10*3/uL — ABNORMAL HIGH (ref 150–379)
RBC: 5.01 x10E6/uL (ref 4.14–5.80)
RDW: 13.1 % (ref 12.3–15.4)
WBC: 6.8 10*3/uL (ref 3.4–10.8)

## 2017-02-22 LAB — LIPID PANEL W/O CHOL/HDL RATIO
Cholesterol, Total: 155 mg/dL (ref 100–199)
HDL: 38 mg/dL — ABNORMAL LOW (ref >39)
LDL CALC: 98 mg/dL (ref 0–99)
Triglycerides: 96 mg/dL (ref 0–149)
VLDL Cholesterol Cal: 19 mg/dL (ref 5–40)

## 2017-02-22 LAB — PSA: PROSTATE SPECIFIC AG, SERUM: 1.2 ng/mL (ref 0.0–4.0)

## 2017-02-22 LAB — TSH: TSH: 1.64 u[IU]/mL (ref 0.450–4.500)

## 2017-02-22 LAB — COMPREHENSIVE METABOLIC PANEL
ALBUMIN: 4.9 g/dL (ref 3.5–5.5)
ALK PHOS: 56 IU/L (ref 39–117)
ALT: 53 IU/L — ABNORMAL HIGH (ref 0–44)
AST: 35 IU/L (ref 0–40)
Albumin/Globulin Ratio: 2 (ref 1.2–2.2)
BUN / CREAT RATIO: 12 (ref 9–20)
BUN: 14 mg/dL (ref 6–24)
Bilirubin Total: 0.5 mg/dL (ref 0.0–1.2)
CO2: 20 mmol/L (ref 18–29)
CREATININE: 1.15 mg/dL (ref 0.76–1.27)
Calcium: 9.8 mg/dL (ref 8.7–10.2)
Chloride: 106 mmol/L (ref 96–106)
GFR calc Af Amer: 85 mL/min/{1.73_m2} (ref >59)
GFR, EST NON AFRICAN AMERICAN: 74 mL/min/{1.73_m2} (ref >59)
GLOBULIN, TOTAL: 2.4 g/dL (ref 1.5–4.5)
Glucose: 90 mg/dL (ref 65–99)
Potassium: 4.7 mmol/L (ref 3.5–5.2)
SODIUM: 146 mmol/L — AB (ref 134–144)
Total Protein: 7.3 g/dL (ref 6.0–8.5)

## 2017-02-22 NOTE — Telephone Encounter (Signed)
Please call pt he does not know what he needs to do and he is having a procedure tomorrow

## 2017-02-22 NOTE — Telephone Encounter (Signed)
Ok to drive self for no sedation. No solid food for 3 hours prior to procedure.

## 2017-02-22 NOTE — Telephone Encounter (Signed)
Patient does not have anyone to bring him tomorrow, can he come get his procedure without sedation and drive himself ? It is a Cervical Epid/ please let him know

## 2017-02-23 ENCOUNTER — Ambulatory Visit (HOSPITAL_BASED_OUTPATIENT_CLINIC_OR_DEPARTMENT_OTHER): Payer: 59 | Admitting: Pain Medicine

## 2017-02-23 ENCOUNTER — Encounter: Payer: Self-pay | Admitting: Pain Medicine

## 2017-02-23 ENCOUNTER — Ambulatory Visit
Admission: RE | Admit: 2017-02-23 | Discharge: 2017-02-23 | Disposition: A | Discharge status: Home / Self Care | Payer: 59 | Source: Ambulatory Visit | Attending: Pain Medicine | Admitting: Pain Medicine

## 2017-02-23 VITALS — BP 154/94 | HR 61 | Temp 98.5°F | Resp 16 | Ht 66.0 in | Wt 182.0 lb

## 2017-02-23 DIAGNOSIS — M542 Cervicalgia: Secondary | ICD-10-CM | POA: Diagnosis present

## 2017-02-23 DIAGNOSIS — M50323 Other cervical disc degeneration at C6-C7 level: Secondary | ICD-10-CM | POA: Diagnosis not present

## 2017-02-23 DIAGNOSIS — G8929 Other chronic pain: Secondary | ICD-10-CM | POA: Diagnosis present

## 2017-02-23 DIAGNOSIS — M5030 Other cervical disc degeneration, unspecified cervical region: Secondary | ICD-10-CM

## 2017-02-23 DIAGNOSIS — M5412 Radiculopathy, cervical region: Secondary | ICD-10-CM | POA: Insufficient documentation

## 2017-02-23 DIAGNOSIS — M9981 Other biomechanical lesions of cervical region: Secondary | ICD-10-CM

## 2017-02-23 DIAGNOSIS — M4682 Other specified inflammatory spondylopathies, cervical region: Secondary | ICD-10-CM

## 2017-02-23 DIAGNOSIS — M4722 Other spondylosis with radiculopathy, cervical region: Secondary | ICD-10-CM

## 2017-02-23 DIAGNOSIS — M4802 Spinal stenosis, cervical region: Secondary | ICD-10-CM

## 2017-02-23 DIAGNOSIS — M4692 Unspecified inflammatory spondylopathy, cervical region: Secondary | ICD-10-CM

## 2017-02-23 MED ORDER — DEXAMETHASONE SODIUM PHOSPHATE 4 MG/ML IJ SOLN
10.0000 mg | Freq: Once | INTRAMUSCULAR | Status: AC
Start: 2017-02-23 — End: 2017-02-23
  Administered 2017-02-23: 10 mg

## 2017-02-23 MED ORDER — ROPIVACAINE HCL 2 MG/ML IJ SOLN
INTRAMUSCULAR | Status: AC
  Filled 2017-02-23: qty 10

## 2017-02-23 MED ORDER — DEXAMETHASONE SODIUM PHOSPHATE 4 MG/ML IJ SOLN
INTRAMUSCULAR | Status: AC
Start: 2017-02-23 — End: 2017-02-23
  Filled 2017-02-23: qty 3

## 2017-02-23 MED ORDER — IOPAMIDOL (ISOVUE-M 200) INJECTION 41%
10.0000 mL | Freq: Once | INTRAMUSCULAR | Status: AC
  Administered 2017-02-23: 10 mL via EPIDURAL
  Filled 2017-02-23: qty 10

## 2017-02-23 MED ORDER — ROPIVACAINE HCL 2 MG/ML IJ SOLN
1.0000 mL | Freq: Once | INTRAMUSCULAR | Status: AC
  Administered 2017-02-23: 1 mL via EPIDURAL

## 2017-02-23 MED ORDER — LIDOCAINE HCL (PF) 1 % IJ SOLN
10.0000 mL | Freq: Once | INTRAMUSCULAR | Status: AC
  Administered 2017-02-23: 5 mL

## 2017-02-23 MED ORDER — ROPIVACAINE HCL 5 MG/ML IJ SOLN
1.0000 mL | Freq: Once | INTRAMUSCULAR | Status: DC
  Filled 2017-02-23: qty 1

## 2017-02-23 MED ORDER — LIDOCAINE HCL (PF) 1 % IJ SOLN
INTRAMUSCULAR | Status: AC
  Filled 2017-02-23: qty 5

## 2017-02-23 MED ORDER — SODIUM CHLORIDE 0.9% FLUSH
1.0000 mL | Freq: Once | INTRAVENOUS | Status: AC
  Administered 2017-02-23: 1 mL

## 2017-02-23 NOTE — Progress Notes (Signed)
Safety precautions to be maintained throughout the outpatient stay will include: orient to surroundings, keep bed in low position, maintain call bell within reach at all times, provide assistance with transfer out of bed and ambulation.  

## 2017-02-23 NOTE — Progress Notes (Signed)
Patient's Name: Jose Washington  MRN: [id number]  Referring Provider: Milinda Pointer, MD  DOB: 10-14-1966  PCP: Kathrine Haddock, NP  DOS: 02/23/2017  Note by: Kathlen Brunswick. Dossie Arbour, MD  Service setting: Ambulatory outpatient  Location: [facility] (AMB) Pain Management Facility  Visit type: Procedure  Specialty: Interventional Pain Management  Patient type: Established   Primary Reason for Visit: Interventional Pain Management Treatment. CC: Neck Pain (left)  Procedure:  Anesthesia, Analgesia, Anxiolysis:  Type: Diagnostic, Inter-Laminar, Epidural Steroid Injection Region: Posterior Cervico-thoracic Region Level: C7-T1 Laterality: Left-Sided Paramedial  Type: Local Anesthesia Local Anesthetic: Lidocaine 1% Route: Infiltration ([place]/IM) IV Access: Declined Sedation: Declined  Indication(s): Analgesia          Indications: 1. Cervical radiculitis (Bilateral) (L>R)   2. Cervical spondylitis with radiculitis (HCC)   3. Cervical foraminal stenosis (Bilateral)   4. Chronic neck pain (Location of Primary Source of Pain) (Bilateral) (L>R)   5. Cervical DDD (C4-5, C5-6, C6-7 and C7-T1)    Pain Score: Pre-procedure: 1 /10 Post-procedure: 1 /10  Pre-op Assessment:  Previous date of service: 01/27/17 Service provided: Evaluation Jose Washington is a 51 y.o. (year old), male patient, seen today for interventional treatment. He  has a past surgical history that includes eye muscle repair (1972 and 1975) and bLEEDING ULCER (2012). His primarily concern today is the Neck Pain (left)  Initial Vital Signs: Blood pressure 129/86, pulse 61, temperature 98.5 F (36.9 C), temperature source Oral, resp. rate 18, height 5\' 6"  (1.676 m), weight 182 lb (82.6 kg), SpO2 100 %. BMI: 29.38 kg/m  Risk Assessment: Allergies: Reviewed. He has No Known Allergies.  Allergy Precautions: None required Coagulopathies: "Reviewed. None identified.  Blood-thinner therapy: None at this time Active Infection(s): Reviewed. None  identified. Jose Washington is afebrile  Site Confirmation: Jose Washington was asked to confirm the procedure and laterality before marking the site Procedure checklist: Completed Consent: Before the procedure and under the influence of no sedative(s), amnesic(s), or anxiolytics, the patient was informed of the treatment options, risks and possible complications. To fulfill our ethical and legal obligations, as recommended by the American Medical Association's Code of Ethics, I have informed the patient of my clinical impression; the nature and purpose of the treatment or procedure; the risks, benefits, and possible complications of the intervention; the alternatives, including doing nothing; the risk(s) and benefit(s) of the alternative treatment(s) or procedure(s); and the risk(s) and benefit(s) of doing nothing. The patient was provided information about the general risks and possible complications associated with the procedure. These may include, but are not limited to: failure to achieve desired goals, infection, bleeding, organ or nerve damage, allergic reactions, paralysis, and death. In addition, the patient was informed of those risks and complications associated to Spine-related procedures, such as failure to decrease pain; infection (i.e.: Meningitis, epidural or intraspinal abscess); bleeding (i.e.: epidural hematoma, subarachnoid hemorrhage, or any other type of intraspinal or peri-dural bleeding); organ or nerve damage (i.e.: Any type of peripheral nerve, nerve root, or spinal cord injury) with subsequent damage to sensory, motor, and/or autonomic systems, resulting in permanent pain, numbness, and/or weakness of one or several areas of the body; allergic reactions; (i.e.: anaphylactic reaction); and/or death. Furthermore, the patient was informed of those risks and complications associated with the medications. These include, but are not limited to: allergic reactions (i.e.: anaphylactic or  anaphylactoid reaction(s)); adrenal axis suppression; blood sugar elevation that in diabetics may result in ketoacidosis or comma; water retention that in patients with history of congestive  heart failure may result in shortness of breath, pulmonary edema, and decompensation with resultant heart failure; weight gain; swelling or edema; medication-induced neural toxicity; particulate matter embolism and blood vessel occlusion with resultant organ, and/or nervous system infarction; and/or aseptic necrosis of one or more joints. Finally, the patient was informed that Medicine is not an exact science; therefore, there is also the possibility of unforeseen or unpredictable risks and/or possible complications that may result in a catastrophic outcome. The patient indicated having understood very clearly. We have given the patient no guarantees and we have made no promises. Enough time was given to the patient to ask questions, all of which were answered to the patient's satisfaction. Jose Washington has indicated that he wanted to continue with the procedure. Attestation: I, the ordering provider, attest that I have discussed with the patient the benefits, risks, side-effects, alternatives, likelihood of achieving goals, and potential problems during recovery for the procedure that I have provided informed consent. Date: 02/23/2017; Time: 10:16 AM  Pre-Procedure Preparation:  Monitoring: As per clinic protocol. Respiration, ETCO2, SpO2, BP, heart rate and rhythm monitor placed and checked for adequate function Safety Precautions: Patient was assessed for positional comfort and pressure points before starting the procedure. Time-out: I initiated and conducted the "Time-out" before starting the procedure, as per protocol. The patient was asked to participate by confirming the accuracy of the "Time Out" information. Verification of the correct person, site, and procedure were performed and confirmed by me, the nursing  staff, and the patient. "Time-out" conducted as per Joint Commission's Universal Protocol (UP.01.01.01). "Time-out" Date & Time: 02/23/2017; 1041 hrs.  Description of Procedure Process:   Position: Prone with head of the table was raised to facilitate breathing. Target Area: For Epidural Steroid injections the target is the interlaminar space, initially targeting the lower border of the superior vertebral body lamina. Approach: Paramedial approach. Area Prepped: Entire PosteriorCervical Region Prepping solution: ChloraPrep (2% chlorhexidine gluconate and 70% isopropyl alcohol) Safety Precautions: Aspiration looking for blood return was conducted prior to all injections. At no point did we inject any substances, as a needle was being advanced. No attempts were made at seeking any paresthesias. Safe injection practices and needle disposal techniques used. Medications properly checked for expiration dates. SDV (single dose vial) medications used. Description of the Procedure: Protocol guidelines were followed. The procedure needle was introduced through the skin, ipsilateral to the reported pain, and advanced to the target area. Bone was contacted and the needle walked caudad, until the lamina was cleared. The epidural space was identified using "loss-of-resistance technique" with 2-3 ml of PF-NaCl (0.9% NSS), in a 5cc LOR glass syringe. Vitals:   02/23/17 1040 02/23/17 1044 02/23/17 1050 02/23/17 1053  BP: 103/63 120/72 (!) 154/94   Pulse:      Resp: 16 18 16 16   Temp:      TempSrc:      SpO2: 100% 100% 100% 100%  Weight:      Height:        Start Time: 1041 hrs. End Time: 1052 hrs. Materials:  Needle(s) Type: Epidural needle Gauge: 17G Length: 3.5-in Medication(s): We administered dexamethasone, iopamidol, lidocaine (PF), sodium chloride flush, and ropivacaine (PF) 2 mg/mL (0.2%). Please see chart orders for dosing details.  Imaging Guidance (Spinal):  Type of Imaging Technique:  Fluoroscopy Guidance (Spinal) Indication(s): Assistance in needle guidance and placement for procedures requiring needle placement in or near specific anatomical locations not easily accessible without such assistance. Exposure Time: Please see nurses notes. Contrast:  Before injecting any contrast, we confirmed that the patient did not have an allergy to iodine, shellfish, or radiological contrast. Once satisfactory needle placement was completed at the desired level, radiological contrast was injected. Contrast injected under live fluoroscopy. No contrast complications. See chart for type and volume of contrast used. Fluoroscopic Guidance: I was personally present during the use of fluoroscopy. "Tunnel Vision Technique" used to obtain the best possible view of the target area. Parallax error corrected before commencing the procedure. "Direction-depth-direction" technique used to introduce the needle under continuous pulsed fluoroscopy. Once target was reached, antero-posterior, oblique, and lateral fluoroscopic projection used confirm needle placement in all planes. Images permanently stored in EMR. Interpretation: I personally interpreted the imaging intraoperatively. Adequate needle placement confirmed in multiple planes. Appropriate spread of contrast into desired area was observed. No evidence of afferent or efferent intravascular uptake. No intrathecal or subarachnoid spread observed. Permanent images saved into the patient's record.  Antibiotic Prophylaxis:  Indication(s): None identified Antibiotic given: None  Post-operative Assessment:  EBL: None Complications: No immediate post-treatment complications observed by team, or reported by patient. Note: The patient tolerated the entire procedure well. A repeat set of vitals were taken after the procedure and the patient was kept under observation following institutional policy, for this type of procedure. Post-procedural neurological assessment  was performed, showing return to baseline, prior to discharge. The patient was provided with post-procedure discharge instructions, including a section on how to identify potential problems. Should any problems arise concerning this procedure, the patient was given instructions to immediately contact us, at any time, without hesitation. In any case, we plan to contact the patient by telephone for a follow-up status report regarding this interventional procedure. Comments:  No additional relevant information.  Plan of Care  Disposition: Discharge home  Discharge Date & Time: 02/23/2017; 1105 hrs.  Physician-requested Follow-up:  Return for Post-Procedure Eval, (2 wks), w/ MD.  Future Appointments Date Time Provider [place]  03/23/2017 1:45 PM Milinda Pointer, MD [facility] None  04/28/2017 9:20 AM Vevelyn Francois, NP [facility] None  08/22/2017 10:30 AM Kathrine Haddock, NP [facility] None   Medications ordered for procedure: Meds ordered this encounter  Medications  . dexamethasone (DECADRON) injection 10 mg  . iopamidol (ISOVUE-M) 41 % intrathecal injection 10 mL  . lidocaine (PF) (XYLOCAINE) 1 % injection 10 mL  . sodium chloride flush (NS) 0.9 % injection 1 mL  . ropivacaine (PF) 2 mg/mL (0.2%) (NAROPIN) injection 1 mL  . ropivacaine (PF) 5 mg/mL (0.5%) (NAROPIN) injection 1 mL    Preservative-free (MPF), single use vial.   Medications administered: We administered dexamethasone, iopamidol, lidocaine (PF), sodium chloride flush, and ropivacaine (PF) 2 mg/mL (0.2%).  See the medical record for exact dosing, route, and time of administration.  Lab-work, Procedure(s), & Referral(s) Ordered: Orders Placed This Encounter  Procedures  . DG C-Arm 1-60 Min-No Report  . Informed Consent Details: Transcribe to consent form and obtain patient signature  . Provider attestation of informed consent for procedure/surgical case  . Verify informed consent  . Discharge instructions  .  Follow-up   Imaging Ordered: No results found for this or any previous visit. New Prescriptions   No medications on file   Primary Care Physician: Kathrine Haddock, NP Location: [facility] Note by: Kathlen Brunswick. Dossie Arbour, M.D, DABA, DABAPM, DABPM, DABIPP, FIPP Date: 02/23/2017; Time: 1:11 PM  Disclaimer:  Medicine is not an Chief Strategy Officer. The only guarantee in medicine is that nothing is guaranteed. It  is important to note that the decision to proceed with this intervention was based on the information collected from the patient. The Data and conclusions were drawn from the patient's questionnaire, the interview, and the physical examination. Because the information was provided in large part by the patient, it cannot be guaranteed that it has not been purposely or unconsciously manipulated. Every effort has been made to obtain as much relevant data as possible for this evaluation. It is important to note that the conclusions that lead to this procedure are derived in large part from the available data. Always take into account that the treatment will also be dependent on availability of resources and existing treatment guidelines, considered by other Pain Management Practitioners as being common knowledge and practice, at the time of the intervention. For Medico-Legal purposes, it is also important to point out that variation in procedural techniques and pharmacological choices are the acceptable norm. The indications, contraindications, technique, and results of the above procedure should only be interpreted and judged by a Board-Certified Interventional Pain Specialist with extensive familiarity and expertise in the same exact procedure and technique.  Instructions provided at this appointment: Patient Instructions  Pain Management Discharge Instructions  General Discharge Instructions :  If you need to reach your doctor call: Monday-Friday 8:00 am - 4:00 pm at  [phone] or toll free [phone].  After clinic hours [phone] to have operator reach doctor.  Bring all of your medication bottles to all your appointments in the pain clinic.  To cancel or reschedule your appointment with Pain Management please remember to call 24 hours in advance to avoid a fee.  Refer to the educational materials which you have been given on: General Risks, I had my Procedure. Discharge Instructions, Post Sedation.  Post Procedure Instructions:  The drugs you were given will stay in your system until tomorrow, so for the next 24 hours you should not drive, make any legal decisions or drink any alcoholic beverages.  You may eat anything you prefer, but it is better to start with liquids then soups and crackers, and gradually work up to solid foods.  Please notify your doctor immediately if you have any unusual bleeding, trouble breathing or pain that is not related to your normal pain.  Depending on the type of procedure that was done, some parts of your body may feel week and/or numb.  This usually clears up by tonight or the next day.  Walk with the use of an assistive device or accompanied by an adult for the 24 hours.  You may use ice on the affected area for the first 24 hours.  Put ice in a Ziploc bag and cover with a towel and place against area 15 minutes on 15 minutes off.  You may switch to heat after 24 hours.  Please complete your Post Procedure diary.Pain Management Discharge Instructions  General Discharge Instructions :  If you need to reach your doctor call: Monday-Friday 8:00 am - 4:00 pm at [phone] or toll free [phone].  After clinic hours [phone] to have operator reach doctor.  Bring all of your medication bottles to all your appointments in the pain clinic.  To cancel or reschedule your appointment with Pain Management please remember to call 24 hours in advance to avoid a fee.  Refer to the educational materials  which you have been given on: General Risks, I had my Procedure. Discharge Instructions, Post Sedation.  Post Procedure Instructions:  The drugs you were given will stay in  your system until tomorrow, so for the next 24 hours you should not drive, make any legal decisions or drink any alcoholic beverages.  You may eat anything you prefer, but it is better to start with liquids then soups and crackers, and gradually work up to solid foods.  Please notify your doctor immediately if you have any unusual bleeding, trouble breathing or pain that is not related to your normal pain.  Depending on the type of procedure that was done, some parts of your body may feel week and/or numb.  This usually clears up by tonight or the next day.  Walk with the use of an assistive device or accompanied by an adult for the 24 hours.  You may use ice on the affected area for the first 24 hours.  Put ice in a Ziploc bag and cover with a towel and place against area 15 minutes on 15 minutes off.  You may switch to heat after 24 hours.Epidural Steroid Injection Patient Information  Description: The epidural space surrounds the nerves as they exit the spinal cord.  In some patients, the nerves can be compressed and inflamed by a bulging disc or a tight spinal canal (spinal stenosis).  By injecting steroids into the epidural space, we can bring irritated nerves into direct contact with a potentially helpful medication.  These steroids act directly on the irritated nerves and can reduce swelling and inflammation which often leads to decreased pain.  Epidural steroids may be injected anywhere along the spine and from the neck to the low back depending upon the location of your pain.   After numbing the skin with local anesthetic (like Novocaine), a small needle is passed into the epidural space slowly.  You may experience a sensation of pressure while this is being done.  The entire block usually last less than 10  minutes.  Conditions which may be treated by epidural steroids:   Low back and leg pain  Neck and arm pain  Spinal stenosis  Post-laminectomy syndrome  Herpes zoster (shingles) pain  Pain from compression fractures  Preparation for the injection:  1. Do not eat any solid food or dairy products within 8 hours of your appointment.  2. You may drink clear liquids up to 3 hours before appointment.  Clear liquids include water, black coffee, juice or soda.  No milk or cream please. 3. You may take your regular medication, including pain medications, with a sip of water before your appointment  Diabetics should hold regular insulin (if taken separately) and take 1/2 normal NPH dos the morning of the procedure.  Carry some sugar containing items with you to your appointment. 4. A driver must accompany you and be prepared to drive you home after your procedure.  5. Bring all your current medications with your. 6. An IV may be inserted and sedation may be given at the discretion of the physician.   7. A blood pressure cuff, EKG and other monitors will often be applied during the procedure.  Some patients may need to have extra oxygen administered for a short period. 8. You will be asked to provide medical information, including your allergies, prior to the procedure.  We must know immediately if you are taking blood thinners (like Coumadin/Warfarin)  Or if you are allergic to IV iodine contrast (dye). We must know if you could possible be pregnant.  Possible side-effects:  Bleeding from needle site  Infection (rare, may require surgery)  Nerve injury (rare)  Numbness &  tingling (temporary)  Difficulty urinating (rare, temporary)  Spinal headache ( a headache worse with upright posture)  Light -headedness (temporary)  Pain at injection site (several days)  Decreased blood pressure (temporary)  Weakness in arm/leg (temporary)  Pressure sensation in back/neck (temporary)  Call  if you experience:  Fever/chills associated with headache or increased back/neck pain.  Headache worsened by an upright position.  New onset weakness or numbness of an extremity below the injection site  Hives or difficulty breathing (go to the emergency room)  Inflammation or drainage at the infection site  Severe back/neck pain  Any new symptoms which are concerning to you  Please note:  Although the local anesthetic injected can often make your back or neck feel good for several hours after the injection, the pain will likely return.  It takes 3-7 days for steroids to work in the epidural space.  You may not notice any pain relief for at least that one week.  If effective, we will often do a series of three injections spaced 3-6 weeks apart to maximally decrease your pain.  After the initial series, we generally will wait several months before considering a repeat injection of the same type.  If you have any questions, please call [phone] [facility]  Post-Procedure instructions Instructions:  Apply ice: Fill a plastic sandwich bag with crushed ice. Cover it with a small towel and apply to injection site. Apply for 15 minutes then remove x 15 minutes. Repeat sequence on day of procedure, until you go to bed. The purpose is to minimize swelling and discomfort after procedure.  Apply heat: Apply heat to procedure site starting the day following the procedure. The purpose is to treat any soreness and discomfort from the procedure.  Food intake: Start with clear liquids (like water) and advance to regular food, as tolerated.   Physical activities: Keep activities to a minimum for the first 8 hours after the procedure.   Driving: If you have received any sedation, you are not allowed to drive for 24 hours after your procedure.  Blood thinner: Restart your blood thinner 6 hours after your procedure. (Only for those taking blood  thinners)  Insulin: As soon as you can eat, you may resume your normal dosing schedule. (Only for those taking insulin)  Infection prevention: Keep procedure site clean and dry.  Post-procedure Pain Diary: Extremely important that this be done correctly and accurately. Recorded information will be used to determine the next step in treatment.  Pain evaluated is that of treated area only. Do not include pain from an untreated area.  Complete every hour, on the hour, for the initial 8 hours. Set an alarm to help you do this part accurately.  Do not go to sleep and have it completed later. It will not be accurate.  Follow-up appointment: Keep your follow-up appointment after the procedure. Usually 2 weeks for most procedures. (6 weeks in the case of radiofrequency.) Bring you pain diary.  Expect:  From numbing medicine (AKA: Local Anesthetics): Numbness or decrease in pain.  Onset: Full effect within 15 minutes of injected.  Duration: It will depend on the type of local anesthetic used. On the average, 1 to 8 hours.   From steroids: Decrease in swelling or inflammation. Once inflammation is improved, relief of the pain will follow.  Onset of benefits: Depends on the amount of swelling present. The more swelling, the longer it will take for the benefits to be seen.  Duration: Steroids will stay in the system x 2 weeks. Duration of benefits will depend on multiple posibilities including persistent irritating factors.  From procedure: Some discomfort is to be expected once the numbing medicine wears off. This should be minimal if ice and heat are applied as instructed. Call if:  You experience numbness and weakness that gets worse with time, as opposed to wearing off.  New onset bowel or bladder incontinence. (Spinal procedures only)  Emergency Numbers:  [place] business hours (Monday - Thursday, 8:00 AM - 4:00 PM) (Friday, 9:00 AM - 12:00 Noon): [phone]  After hours: [phone]  [phone] _____________________________________________________________________________________________

## 2017-02-23 NOTE — Patient Instructions (Addendum)
Pain Management Discharge Instructions  General Discharge Instructions :  If you need to reach your doctor call: Monday-Friday 8:00 am - 4:00 pm at [phone] or toll free [phone].  After clinic hours [phone] to have operator reach doctor.  Bring all of your medication bottles to all your appointments in the pain clinic.  To cancel or reschedule your appointment with Pain Management please remember to call 24 hours in advance to avoid a fee.  Refer to the educational materials which you have been given on: General Risks, I had my Procedure. Discharge Instructions, Post Sedation.  Post Procedure Instructions:  The drugs you were given will stay in your system until tomorrow, so for the next 24 hours you should not drive, make any legal decisions or drink any alcoholic beverages.  You may eat anything you prefer, but it is better to start with liquids then soups and crackers, and gradually work up to solid foods.  Please notify your doctor immediately if you have any unusual bleeding, trouble breathing or pain that is not related to your normal pain.  Depending on the type of procedure that was done, some parts of your body may feel week and/or numb.  This usually clears up by tonight or the next day.  Walk with the use of an assistive device or accompanied by an adult for the 24 hours.  You may use ice on the affected area for the first 24 hours.  Put ice in a Ziploc bag and cover with a towel and place against area 15 minutes on 15 minutes off.  You may switch to heat after 24 hours.  Please complete your Post Procedure diary.Pain Management Discharge Instructions  General Discharge Instructions :  If you need to reach your doctor call: Monday-Friday 8:00 am - 4:00 pm at [phone] or toll free [phone].  After clinic hours [phone] to have operator reach doctor.  Bring all of your medication bottles to all your appointments in the pain clinic.  To cancel  or reschedule your appointment with Pain Management please remember to call 24 hours in advance to avoid a fee.  Refer to the educational materials which you have been given on: General Risks, I had my Procedure. Discharge Instructions, Post Sedation.  Post Procedure Instructions:  The drugs you were given will stay in your system until tomorrow, so for the next 24 hours you should not drive, make any legal decisions or drink any alcoholic beverages.  You may eat anything you prefer, but it is better to start with liquids then soups and crackers, and gradually work up to solid foods.  Please notify your doctor immediately if you have any unusual bleeding, trouble breathing or pain that is not related to your normal pain.  Depending on the type of procedure that was done, some parts of your body may feel week and/or numb.  This usually clears up by tonight or the next day.  Walk with the use of an assistive device or accompanied by an adult for the 24 hours.  You may use ice on the affected area for the first 24 hours.  Put ice in a Ziploc bag and cover with a towel and place against area 15 minutes on 15 minutes off.  You may switch to heat after 24 hours.Epidural Steroid Injection Patient Information  Description: The epidural space surrounds the nerves as they exit the spinal cord.  In some patients, the nerves can be compressed and inflamed by a bulging disc or a  tight spinal canal (spinal stenosis).  By injecting steroids into the epidural space, we can bring irritated nerves into direct contact with a potentially helpful medication.  These steroids act directly on the irritated nerves and can reduce swelling and inflammation which often leads to decreased pain.  Epidural steroids may be injected anywhere along the spine and from the neck to the low back depending upon the location of your pain.   After numbing the skin with local anesthetic (like Novocaine), a small needle is passed into the  epidural space slowly.  You may experience a sensation of pressure while this is being done.  The entire block usually last less than 10 minutes.  Conditions which may be treated by epidural steroids:   Low back and leg pain  Neck and arm pain  Spinal stenosis  Post-laminectomy syndrome  Herpes zoster (shingles) pain  Pain from compression fractures  Preparation for the injection:  1. Do not eat any solid food or dairy products within 8 hours of your appointment.  2. You may drink clear liquids up to 3 hours before appointment.  Clear liquids include water, black coffee, juice or soda.  No milk or cream please. 3. You may take your regular medication, including pain medications, with a sip of water before your appointment  Diabetics should hold regular insulin (if taken separately) and take 1/2 normal NPH dos the morning of the procedure.  Carry some sugar containing items with you to your appointment. 4. A driver must accompany you and be prepared to drive you home after your procedure.  5. Bring all your current medications with your. 6. An IV may be inserted and sedation may be given at the discretion of the physician.   7. A blood pressure cuff, EKG and other monitors will often be applied during the procedure.  Some patients may need to have extra oxygen administered for a short period. 8. You will be asked to provide medical information, including your allergies, prior to the procedure.  We must know immediately if you are taking blood thinners (like Coumadin/Warfarin)  Or if you are allergic to IV iodine contrast (dye). We must know if you could possible be pregnant.  Possible side-effects:  Bleeding from needle site  Infection (rare, may require surgery)  Nerve injury (rare)  Numbness & tingling (temporary)  Difficulty urinating (rare, temporary)  Spinal headache ( a headache worse with upright posture)  Light -headedness (temporary)  Pain at injection site  (several days)  Decreased blood pressure (temporary)  Weakness in arm/leg (temporary)  Pressure sensation in back/neck (temporary)  Call if you experience:  Fever/chills associated with headache or increased back/neck pain.  Headache worsened by an upright position.  New onset weakness or numbness of an extremity below the injection site  Hives or difficulty breathing (go to the emergency room)  Inflammation or drainage at the infection site  Severe back/neck pain  Any new symptoms which are concerning to you  Please note:  Although the local anesthetic injected can often make your back or neck feel good for several hours after the injection, the pain will likely return.  It takes 3-7 days for steroids to work in the epidural space.  You may not notice any pain relief for at least that one week.  If effective, we will often do a series of three injections spaced 3-6 weeks apart to maximally decrease your pain.  After the initial series, we generally will wait several months before considering a repeat  injection of the same type.  If you have any questions, please call [phone] [facility]  Post-Procedure instructions Instructions:  Apply ice: Fill a plastic sandwich bag with crushed ice. Cover it with a small towel and apply to injection site. Apply for 15 minutes then remove x 15 minutes. Repeat sequence on day of procedure, until you go to bed. The purpose is to minimize swelling and discomfort after procedure.  Apply heat: Apply heat to procedure site starting the day following the procedure. The purpose is to treat any soreness and discomfort from the procedure.  Food intake: Start with clear liquids (like water) and advance to regular food, as tolerated.   Physical activities: Keep activities to a minimum for the first 8 hours after the procedure.   Driving: If you have received any sedation, you are not allowed to drive for 24  hours after your procedure.  Blood thinner: Restart your blood thinner 6 hours after your procedure. (Only for those taking blood thinners)  Insulin: As soon as you can eat, you may resume your normal dosing schedule. (Only for those taking insulin)  Infection prevention: Keep procedure site clean and dry.  Post-procedure Pain Diary: Extremely important that this be done correctly and accurately. Recorded information will be used to determine the next step in treatment.  Pain evaluated is that of treated area only. Do not include pain from an untreated area.  Complete every hour, on the hour, for the initial 8 hours. Set an alarm to help you do this part accurately.  Do not go to sleep and have it completed later. It will not be accurate.  Follow-up appointment: Keep your follow-up appointment after the procedure. Usually 2 weeks for most procedures. (6 weeks in the case of radiofrequency.) Bring you pain diary.  Expect:  From numbing medicine (AKA: Local Anesthetics): Numbness or decrease in pain.  Onset: Full effect within 15 minutes of injected.  Duration: It will depend on the type of local anesthetic used. On the average, 1 to 8 hours.   From steroids: Decrease in swelling or inflammation. Once inflammation is improved, relief of the pain will follow.  Onset of benefits: Depends on the amount of swelling present. The more swelling, the longer it will take for the benefits to be seen.   Duration: Steroids will stay in the system x 2 weeks. Duration of benefits will depend on multiple posibilities including persistent irritating factors.  From procedure: Some discomfort is to be expected once the numbing medicine wears off. This should be minimal if ice and heat are applied as instructed. Call if:  You experience numbness and weakness that gets worse with time, as opposed to wearing off.  New onset bowel or bladder incontinence. (Spinal procedures only)  Emergency  Numbers:  [facility] business hours (Monday - Thursday, 8:00 AM - 4:00 PM) (Friday, 9:00 AM - 12:00 Noon): [phone]  After hours: [phone] _____________________________________________________________________________________________

## 2017-02-24 NOTE — Telephone Encounter (Signed)
Denies any needs at this time. States he is doing well and getting to write down a bunch of zero in his diary. Instructed to call if needed.

## 2017-03-23 ENCOUNTER — Encounter: Payer: Self-pay | Admitting: Pain Medicine

## 2017-03-23 ENCOUNTER — Ambulatory Visit: Payer: 59 | Attending: Pain Medicine | Admitting: Pain Medicine

## 2017-03-23 VITALS — BP 132/70 | HR 62 | Temp 98.4°F | Resp 18 | Ht 67.0 in | Wt 184.0 lb

## 2017-03-23 DIAGNOSIS — Z79891 Long term (current) use of opiate analgesic: Secondary | ICD-10-CM | POA: Insufficient documentation

## 2017-03-23 DIAGNOSIS — M488X2 Other specified spondylopathies, cervical region: Secondary | ICD-10-CM | POA: Diagnosis not present

## 2017-03-23 DIAGNOSIS — G8929 Other chronic pain: Secondary | ICD-10-CM

## 2017-03-23 DIAGNOSIS — M5030 Other cervical disc degeneration, unspecified cervical region: Secondary | ICD-10-CM

## 2017-03-23 DIAGNOSIS — Z818 Family history of other mental and behavioral disorders: Secondary | ICD-10-CM | POA: Insufficient documentation

## 2017-03-23 DIAGNOSIS — Z8719 Personal history of other diseases of the digestive system: Secondary | ICD-10-CM | POA: Insufficient documentation

## 2017-03-23 DIAGNOSIS — Z833 Family history of diabetes mellitus: Secondary | ICD-10-CM | POA: Insufficient documentation

## 2017-03-23 DIAGNOSIS — M50121 Cervical disc disorder at C4-C5 level with radiculopathy: Secondary | ICD-10-CM | POA: Insufficient documentation

## 2017-03-23 DIAGNOSIS — M47812 Spondylosis without myelopathy or radiculopathy, cervical region: Secondary | ICD-10-CM

## 2017-03-23 DIAGNOSIS — M4803 Spinal stenosis, cervicothoracic region: Secondary | ICD-10-CM | POA: Diagnosis not present

## 2017-03-23 DIAGNOSIS — F329 Major depressive disorder, single episode, unspecified: Secondary | ICD-10-CM | POA: Insufficient documentation

## 2017-03-23 DIAGNOSIS — Z823 Family history of stroke: Secondary | ICD-10-CM | POA: Diagnosis not present

## 2017-03-23 DIAGNOSIS — E785 Hyperlipidemia, unspecified: Secondary | ICD-10-CM | POA: Diagnosis not present

## 2017-03-23 DIAGNOSIS — G894 Chronic pain syndrome: Secondary | ICD-10-CM | POA: Diagnosis not present

## 2017-03-23 DIAGNOSIS — M542 Cervicalgia: Secondary | ICD-10-CM

## 2017-03-23 DIAGNOSIS — E559 Vitamin D deficiency, unspecified: Secondary | ICD-10-CM | POA: Diagnosis not present

## 2017-03-23 DIAGNOSIS — F419 Anxiety disorder, unspecified: Secondary | ICD-10-CM | POA: Diagnosis not present

## 2017-03-23 DIAGNOSIS — M4802 Spinal stenosis, cervical region: Secondary | ICD-10-CM | POA: Diagnosis not present

## 2017-03-23 DIAGNOSIS — Z79899 Other long term (current) drug therapy: Secondary | ICD-10-CM | POA: Insufficient documentation

## 2017-03-23 DIAGNOSIS — M4722 Other spondylosis with radiculopathy, cervical region: Secondary | ICD-10-CM | POA: Insufficient documentation

## 2017-03-23 DIAGNOSIS — M4692 Unspecified inflammatory spondylopathy, cervical region: Secondary | ICD-10-CM | POA: Diagnosis not present

## 2017-03-23 DIAGNOSIS — M19011 Primary osteoarthritis, right shoulder: Secondary | ICD-10-CM | POA: Insufficient documentation

## 2017-03-23 DIAGNOSIS — I10 Essential (primary) hypertension: Secondary | ICD-10-CM | POA: Diagnosis not present

## 2017-03-23 NOTE — Progress Notes (Signed)
Safety precautions to be maintained throughout the outpatient stay will include: orient to surroundings, keep bed in low position, maintain call bell within reach at all times, provide assistance with transfer out of bed and ambulation.  

## 2017-03-23 NOTE — Progress Notes (Signed)
Patient's Name: Jose Washington  MRN: [id number]  Referring Provider: Kathrine Haddock, NP  DOB: [birth year]  PCP: Kathrine Haddock, NP  DOS: 03/23/2017  Note by: Kathlen Brunswick. Dossie Arbour, MD  Service setting: Ambulatory outpatient  Specialty: Interventional Pain Management  Location: [facility] (AMB) Pain Management Facility    Patient type: Established   Primary Reason(s) for Visit: Encounter for post-procedure evaluation of chronic illness with mild to moderate exacerbation CC: Neck Pain (left) and Back Pain (lower)  HPI  Jose Washington is a 51 y.o. year old, male patient, who comes today for a post-procedure evaluation. He has Chronic neck pain (Location of Primary Source of Pain) (Bilateral) (L>R); Hyperlipidemia; Essential hypertension, benign; Gastritis; Chronic pain syndrome; Chronic low back pain (Location of Secondary source of pain) (Bilateral) (L>R); Chronic upper back pain (Location of Tertiary source of pain) (Bilateral) (L>R); Chronic shoulder pain (Bilateral) (L>R); Osteoarthritis of AC (acromioclavicular) joint (Right); Chronic sacroiliac joint pain (Bilateral) (L>R); Long term current use of opiate analgesic; Long term prescription opiate use; Opiate use (5 MME/Day); Muscle spasticity; Cervical DDD (C4-5, C5-6, C6-7 and C7-T1); Cervical foraminal stenosis (Bilateral: C5-6 & C6-7, Left: C4-5 & C7-T1); Cervical radiculitis (Bilateral) (L>R); Vitamin D insufficiency; Cervical facet syndrome (HCC) (L); Cervical spondylosis; and Musculoskeletal neck pain (trapezius) (Left) on his problem list. His primarily concern today is the Neck Pain (left) and Back Pain (lower)  Pain Assessment: Self-Reported Pain Score: 1 /10             Reported level is compatible with observation.       Pain Type: Chronic pain Pain Location: Neck (back) Pain Orientation: Left Pain Descriptors / Indicators: Nagging Pain Frequency: Constant  Jose Washington comes in today for post-procedure evaluation after the treatment done on  02/23/2017.  Further details on both, my assessment(s), as well as the proposed treatment plan, please see below.  Post-Procedure Assessment  02/23/2017 Procedure: Diagnostic left cervical epidural steroid injection under fluoroscopic guidance, no sedation Pre-procedure pain score:  1/10 Post-procedure pain score: 1/10 No relief Influential Factors: BMI: 28.82 kg/m Intra-procedural challenges: None observed Assessment challenges: None detected         Post-procedural side-effects, adverse reactions, or complications: None reported Reported issues: None  Sedation: No sedation used. When no sedatives are used, the analgesic levels obtained are directly associated to the effectiveness of the local anesthetics. However, when sedation is provided, the level of analgesia obtained during the initial 1 hour following the intervention, is believed to be the result of a combination of factors. These factors may include, but are not limited to: 1. The effectiveness of the local anesthetics used. 2. The effects of the analgesic(s) and/or anxiolytic(s) used. 3. The degree of discomfort experienced by the patient at the time of the procedure. 4. The patients ability and reliability in recalling and recording the events. 5. The presence and influence of possible secondary gains and/or psychosocial factors. Reported result: Relief experienced during the 1st hour after the procedure: 100 % (Ultra-Short Term Relief) Interpretative annotation: No Analgesic or Anxiolytic given, therefore benefits are completely due to Local Anesthetics. Because the relief lasted only 1 hour, it is likely to be attained by the injected lidocaine. This means that it was the lidocaine that was infiltrated into the muscle that might have provided him with this relief. Some of this lidocaine might have also blocks some of the medial branches suggesting the possibility that we may be dealing with cervical facet syndrome.  Effects of  local anesthetic: The analgesic  effects attained during this period are directly associated to the localized infiltration of local anesthetics and therefore cary significant diagnostic value as to the etiological location, or anatomical origin, of the pain. Expected duration of relief is directly dependent on the pharmacodynamics of the local anesthetic used. Long-acting (4-6 hours) anesthetics used.  Reported result: Relief during the next 4 to 6 hour after the procedure: 0 % (Short-Term Relief) Interpretative annotation: No analgesic effect would suggest pain etiology to reside elsewhere.          Long-term benefit: Defined as the period of time past the expected duration of local anesthetics. With the possible exception of prolonged sympathetic blockade from the local anesthetics, benefits during this period are typically attributed to, or associated with, other factors such as analgesic sensory neuropraxia, antiinflammatory effects, or beneficial biochemical changes provided by agents other than the local anesthetics Reported result: Extended relief following procedure: 70 % (ongoing) (Long-Term Relief) Interpretative annotation: Good relief. This could suggest inflammation to be a significant component in the etiology to the pain.          Current benefits: Defined as persistent relief that continues at this point in time.   Reported results: Treated area: <75 % Jose Washington reports improvement in function Interpretative annotation: Ongoing benefits would suggest effective therapeutic approach  Interpretation: Results would suggest a successful diagnostic intervention.          Laboratory Chemistry  Inflammation Markers Lab Results  Component Value Date   CRP 1.4 (H) 11/12/2016   ESRSEDRATE 3 11/12/2016   (CRP: Acute Phase) (ESR: Chronic Phase) Renal Function Markers Lab Results  Component Value Date   BUN 14 02/21/2017   CREATININE 1.15 02/21/2017   GFRAA 85 02/21/2017   GFRNONAA 74  02/21/2017   Hepatic Function Markers Lab Results  Component Value Date   AST 35 02/21/2017   ALT 53 (H) 02/21/2017   ALBUMIN 4.9 02/21/2017   ALKPHOS 56 02/21/2017   Electrolytes Lab Results  Component Value Date   NA 146 (H) 02/21/2017   K 4.7 02/21/2017   CL 106 02/21/2017   CALCIUM 9.8 02/21/2017   MG 2.0 11/12/2016   Neuropathy Markers Lab Results  Component Value Date   VITAMINB12 820 11/12/2016   Bone Pathology Markers Lab Results  Component Value Date   ALKPHOS 56 02/21/2017   25OHVITD1 23 (L) 11/12/2016   25OHVITD2 <1.0 11/12/2016   25OHVITD3 23 11/12/2016   CALCIUM 9.8 02/21/2017   Coagulation Parameters Lab Results  Component Value Date   PLT 389 (H) 02/21/2017   Cardiovascular Markers Lab Results  Component Value Date   HCT 44.5 02/21/2017   Note: Lab results reviewed.  Recent Diagnostic Imaging Review  Dg C-arm 1-60 Min-no Report  Result Date: 02/23/2017 Fluoroscopy was utilized by the requesting physician.  No radiographic interpretation.   Note: Imaging results reviewed.          Meds  The patient has a current medication list which includes the following prescription(s): acetaminophen, atenolol, atorvastatin, baclofen, buspirone, calcium carbonate, vitamin d3, chondroitin sulfate, desloratadine-pseudoephedrine, diphenhydramine hcl, duloxetine, fenofibrate, ferrous sulfate, fluticasone, glucosamine-chondroit-vit c-mn, hydrocodone-acetaminophen, hydrocodone-acetaminophen, melatonin, methylsulfonylmethane, multivitamin, ondansetron, potassium, turmeric, vitamin b-12, vitamin b-12, vitamin d (ergocalciferol), and hydrocodone-acetaminophen.  Current Outpatient Prescriptions on File Prior to Visit  Medication Sig  . acetaminophen (TYLENOL) 500 MG tablet Take 500 mg by mouth every 6 (six) hours as needed.  Marland Kitchen atenolol (TENORMIN) 50 MG tablet Take 1 tablet (50 mg total) by mouth daily.  Marland Kitchen  atorvastatin (LIPITOR) 40 MG tablet Take 1 tablet (40 mg total)  by mouth daily.  Derrill Memo ON 04/04/2017] baclofen (LIORESAL) 10 MG tablet Take 1 tablet (10 mg total) by mouth at bedtime.  . busPIRone (BUSPAR) 30 MG tablet Take 1 tablet (30 mg total) by mouth 2 (two) times daily.  . Calcium Carbonate (CALCIUM 600 PO) Take 600 mg by mouth daily.  . Cholecalciferol (VITAMIN D3) 2000 units capsule Take 1 capsule (2,000 Units total) by mouth daily.  . Chondroitin Sulfate 400 MG CAPS Take by mouth 2 (two) times daily.  Marland Kitchen desloratadine-pseudoephedrine (CLARINEX-D 12-HOUR) 2.5-120 MG 12 hr tablet Take 1 tablet by mouth as needed.  . DiphenhydrAMINE HCl (DIPHEDRYL ALLERGY PO) Take 25 mg by mouth daily.  . DULoxetine (CYMBALTA) 30 MG capsule Take 1 capsule (30 mg total) by mouth 2 (two) times daily.  . fenofibrate 160 MG tablet Take 1 tablet (160 mg total) by mouth daily.  . ferrous sulfate (QC FERROUS SULFATE) 325 (65 FE) MG tablet Take 325 mg by mouth daily with breakfast.  . fluticasone (FLONASE) 50 MCG/ACT nasal spray Place 2 sprays into both nostrils daily.  . Glucosamine-Chondroit-Vit C-Mn (GLUCOSAMINE 1500 COMPLEX PO) Take by mouth 2 (two) times daily.  Marland Kitchen HYDROcodone-acetaminophen (NORCO/VICODIN) 5-325 MG tablet Take 1 tablet by mouth daily as needed for moderate pain.  Derrill Memo ON 04/04/2017] HYDROcodone-acetaminophen (NORCO/VICODIN) 5-325 MG tablet Take 1 tablet by mouth daily as needed for moderate pain.  . Melatonin 3 MG CAPS Take by mouth 3 (three) times a week.  . Methylsulfonylmethane (MSM PO) Take by mouth 2 (two) times daily.  . Multiple Vitamin (MULTIVITAMIN) tablet Take 1 tablet by mouth daily.  . ondansetron (ZOFRAN) 8 MG tablet Take 1 tablet (8 mg total) by mouth 2 (two) times daily.  . Potassium 99 MG TABS Take 1 tablet by mouth.  . TURMERIC PO Take by mouth. 4 times per week  . vitamin B-12 (CYANOCOBALAMIN) 1000 MCG tablet Take 1,000 mcg by mouth daily.  . Vitamin D, Ergocalciferol, (DRISDOL) 50000 units CAPS capsule Take 1 capsule (50,000 Units  total) by mouth 2 (two) times a week x 6 weeks.  Marland Kitchen HYDROcodone-acetaminophen (NORCO/VICODIN) 5-325 MG tablet Take 1 tablet by mouth daily as needed for moderate pain.   No current facility-administered medications on file prior to visit.    ROS  Constitutional: Denies any fever or chills Gastrointestinal: No reported hemesis, hematochezia, vomiting, or acute GI distress Musculoskeletal: Denies any acute onset joint swelling, redness, loss of ROM, or weakness Neurological: No reported episodes of acute onset apraxia, aphasia, dysarthria, agnosia, amnesia, paralysis, loss of coordination, or loss of consciousness  Allergies  Jose Washington has No Known Allergies.  [place]  Drug: Jose Washington  reports that he does not use drugs. Alcohol:  reports that he does not drink alcohol. Tobacco:  reports that he has never smoked. He has never used smokeless tobacco. Medical:  has a past medical history of Allergy; Anxiety; Chronic duodenal ulcer with hemorrhage (2012); Chronic neck pain; Depression; Hyperlipidemia; Hypertension; and Microscopic hematuria. Family: family history includes Allergies in his brother; Cancer in his maternal grandfather and mother; Dementia in his father and paternal grandfather; Depression in his father; Diabetes in his mother; Mental illness in his father; Stroke in his paternal grandfather.  Past Surgical History:  Procedure Laterality Date  . bLEEDING ULCER  2012  . eye muscle repair  1972 and 1975   Constitutional Exam  General appearance: Well nourished, well developed, and  well hydrated. In no apparent acute distress Vitals:   03/23/17 1352  BP: 132/70  Pulse: 62  Resp: 18  Temp: 98.4 F (36.9 C)  SpO2: 100%  Weight: 184 lb (83.5 kg)  Height: _0  (1.702 m)   BMI Assessment: Estimated body mass index is 28.82 kg/m as calculated from the following:   Height as of this encounter: _1  (1.702 m).   Weight as of this encounter: 184 lb (83.5 kg).  BMI  interpretation table: BMI level Category Range association with higher incidence of chronic pain  <18 kg/m2 Underweight   18.5-24.9 kg/m2 Ideal body weight   25-29.9 kg/m2 Overweight Increased incidence by 20%  30-34.9 kg/m2 Obese (Class I) Increased incidence by 68%  35-39.9 kg/m2 Severe obesity (Class II) Increased incidence by 136%  >40 kg/m2 Extreme obesity (Class III) Increased incidence by 254%   BMI Readings from Last 4 Encounters:  03/23/17 28.82 kg/m  02/23/17 29.38 kg/m  02/21/17 28.56 kg/m  01/27/17 28.98 kg/m   Wt Readings from Last 4 Encounters:  03/23/17 184 lb (83.5 kg)  02/23/17 182 lb (82.6 kg)  02/21/17 180 lb 3.2 oz (81.7 kg)  01/27/17 185 lb (83.9 kg)  Psych/Mental status: Alert, oriented x 3 (person, place, & time)       Eyes: PERLA Respiratory: No evidence of acute respiratory distress  Cervical Spine Exam  Inspection: No masses, redness, or swelling Alignment: Symmetrical Functional ROM: Improved after treatment, but still limited. Stability: No instability detected Muscle strength & Tone: Functionally intact Sensory: Movement-associated pain Palpation: Complains of area being tender to palpation              Upper Extremity (UE) Exam    Side: Right upper extremity  Side: Left upper extremity  Inspection: No masses, redness, swelling, or asymmetry. No contractures  Inspection: No masses, redness, swelling, or asymmetry. No contractures  Functional ROM: Unrestricted ROM          Functional ROM: Unrestricted ROM          Muscle strength & Tone: Functionally intact  Muscle strength & Tone: Functionally intact  Sensory: Unimpaired  Sensory: Unimpaired  Palpation: No palpable anomalies              Palpation: No palpable anomalies              Specialized Test(s): Deferred         Specialized Test(s): Deferred          Thoracic Spine Exam  Inspection: No masses, redness, or swelling Alignment: Symmetrical Functional ROM: Unrestricted ROM Stability:  No instability detected Sensory: Unimpaired Muscle strength & Tone: No palpable anomalies  Lumbar Spine Exam  Inspection: No masses, redness, or swelling Alignment: Symmetrical Functional ROM: Unrestricted ROM      Stability: No instability detected Muscle strength & Tone: Functionally intact Sensory: Unimpaired Palpation: No palpable anomalies       Provocative Tests: Lumbar Hyperextension and rotation test: evaluation deferred today       Patrick's Maneuver: evaluation deferred today                    Gait & Posture Assessment  Ambulation: Unassisted Gait: Relatively normal for age and body habitus Posture: WNL   Lower Extremity Exam    Side: Right lower extremity  Side: Left lower extremity  Inspection: No masses, redness, swelling, or asymmetry. No contractures  Inspection: No masses, redness, swelling, or asymmetry. No contractures  Functional ROM: Unrestricted ROM  Functional ROM: Unrestricted ROM          Muscle strength & Tone: Functionally intact  Muscle strength & Tone: Functionally intact  Sensory: Unimpaired  Sensory: Unimpaired  Palpation: No palpable anomalies  Palpation: No palpable anomalies   Assessment  Primary Diagnosis & Pertinent Problem List: The primary encounter diagnosis was Chronic neck pain (Location of Primary Source of Pain) (Bilateral) (L>R). Diagnoses of Cervical facet syndrome (Left), Cervical spondylosis, Cervical DDD (C4-5, C5-6, C6-7 and C7-T1), and Musculoskeletal neck pain (trapezius) (Left) were also pertinent to this visit.  Status Diagnosis  Improving Persistent Responding 1. Chronic neck pain (Location of Primary Source of Pain) (Bilateral) (L>R)   2. Cervical facet syndrome (Left)   3. Cervical spondylosis   4. Cervical DDD (C4-5, C5-6, C6-7 and C7-T1)   5. Musculoskeletal neck pain (trapezius) (Left)     Problems updated and reviewed during this visit: Problem  Cervical facet syndrome (HCC) (L)  Cervical spondylosis   Musculoskeletal neck pain (trapezius) (Left)  Cervical foraminal stenosis (Bilateral: C5-6 & C6-7, Left: C4-5 & C7-T1)   Mild bilateral neural foraminal stenosis is noted on the right at C5-6 and C6-7, and on the left at C4-5, C5-6, C6-7 and C7-T1 secondary to uncovertebral spurring   Osteoarthritis of AC (acromioclavicular) joint (Right)   Plan of Care  Pharmacotherapy (Medications Ordered): No orders of the defined types were placed in this encounter.  New Prescriptions   No medications on file   Medications administered today: Jose Washington had no medications administered during this visit. Lab-work, procedure(s), and/or referral(s): Orders Placed This Encounter  Procedures  . CERVICAL FACET (MEDIAL BRANCH NERVE BLOCK)    Imaging and/or referral(s): None  Interventional therapies: Planned, scheduled, and/or pending:   Diagnostic Left Cervical Facet Block under fluoro and IV sedation.   Considering:   Diagnostic bilateral lumbar facetblock  Possible bilateral lumbar facet RFA Diagnostic right-sided cervical epidural steroid injection  Diagnostic bilateral cervical facetblock  Possible bilateral cervical facet RFA Diagnostic bilateral sacroiliac joint block Possible bilateral sacroiliac joint RFA Diagnostic trigger point injections   Palliative PRN treatment(s):   Palliative  left CESI under fluoroscopy   Provider-requested follow-up: Return for procedure (w/ sedation):, (ASAP), by MD, in addition, Med-Mgmt, by NP.  Future Appointments Date Time Provider [place]  04/28/2017 9:20 AM Vevelyn Francois, NP [facility] None  08/22/2017 10:30 AM Kathrine Haddock, NP [facility] None   Primary Care Physician: Kathrine Haddock, NP Location: [facility] Note by: Kathlen Brunswick. Dossie Arbour, M.D, DABA, DABAPM, DABPM, DABIPP, FIPP Date: 03/23/2017; Time: 3:36 PM  Patient instructions provided during this appointment: Patient Instructions   ____________________________________________________________________________________________  Preparing for Procedure with Sedation Instructions: . Oral Intake: Do not eat or drink anything for at least 8 hours prior to your procedure. . Transportation: Public transportation is not allowed. Bring an adult driver. The driver must be physically present in our waiting room before any procedure can be started. Marland Kitchen Physical Assistance: Bring an adult physically capable of assisting you, in the event you need help. This adult should keep you company at home for at least 6 hours after the procedure. . Blood Pressure Medicine: Take your blood pressure medicine with a sip of water the morning of the procedure. . Blood thinners:  . Diabetics on insulin: Notify the staff so that you can be scheduled 1st case in the morning. If your diabetes requires high dose insulin, take only  of your normal insulin dose the morning of the procedure  and notify the staff that you have done so. . Preventing infections: Shower with an antibacterial soap the morning of your procedure. . Build-up your immune system: Take 1000 mg of Vitamin C with every meal (3 times a day) the day prior to your procedure. Marland Kitchen Antibiotics: Inform the staff if you have a condition or reason that requires you to take antibiotics before dental procedures. . Pregnancy: If you are pregnant, call and cancel the procedure. . Sickness: If you have a cold, fever, or any active infections, call and cancel the procedure. . Arrival: You must be in the facility at least 30 minutes prior to your scheduled procedure. . Children: Do not bring children with you. . Dress appropriately: Bring dark clothing that you would not mind if they get stained. . Valuables: Do not bring any jewelry or valuables. Procedure appointments are reserved for interventional treatments only. Marland Kitchen No Prescription Refills. . No medication changes will be discussed during procedure  appointments. . No disability issues will be discussed. ______________________________________________________________________________________________  Preparing for Procedure with Sedation Instructions: . Oral Intake: Do not eat or drink anything for at least 8 hours prior to your procedure. . Transportation: Public transportation is not allowed. Bring an adult driver. The driver must be physically present in our waiting room before any procedure can be started. Marland Kitchen Physical Assistance: Bring an adult capable of physically assisting you, in the event you need help. . Blood Pressure Medicine: Take your blood pressure medicine with a sip of water the morning of the procedure. . Insulin: Take only  of your normal insulin dose. . Preventing infections: Shower with an antibacterial soap the morning of your procedure. . Build-up your immune system: Take 1000 mg of Vitamin C with every meal (3 times a day) the day prior to your procedure. . Pregnancy: If you are pregnant, call and cancel the procedure. . Sickness: If you have a cold, fever, or any active infections, call and cancel the procedure. . Arrival: You must be in the facility at least 30 minutes prior to your scheduled procedure. . Children: Do not bring children with you. . Dress appropriately: Bring dark clothing that you would not mind if they get stained. . Valuables: Do not bring any jewelry or valuables. Procedure appointments are reserved for interventional treatments only. Marland Kitchen No Prescription Refills. . No medication changes will be discussed during procedure appointments. No disability issues will be discussed.Epidural Steroid Injection Patient Information  Description: The epidural space surrounds the nerves as they exit the spinal cord.  In some patients, the nerves can be compressed and inflamed by a bulging disc or a tight spinal canal (spinal stenosis).  By injecting steroids into the epidural space, we can bring irritated  nerves into direct contact with a potentially helpful medication.  These steroids act directly on the irritated nerves and can reduce swelling and inflammation which often leads to decreased pain.  Epidural steroids may be injected anywhere along the spine and from the neck to the low back depending upon the location of your pain.   After numbing the skin with local anesthetic (like Novocaine), a small needle is passed into the epidural space slowly.  You may experience a sensation of pressure while this is being done.  The entire block usually last less than 10 minutes.  Conditions which may be treated by epidural steroids:  Low back and leg pain Neck and arm pain Spinal stenosis Post-laminectomy syndrome Herpes zoster (shingles) pain Pain from compression fractures  Preparation  for the injection:  Do not eat any solid food or dairy products within 8 hours of your appointment.  You may drink clear liquids up to 3 hours before appointment.  Clear liquids include water, black coffee, juice or soda.  No milk or cream please. You may take your regular medication, including pain medications, with a sip of water before your appointment  Diabetics should hold regular insulin (if taken separately) and take 1/2 normal NPH dos the morning of the procedure.  Carry some sugar containing items with you to your appointment. A driver must accompany you and be prepared to drive you home after your procedure.  Bring all your current medications with your. An IV may be inserted and sedation may be given at the discretion of the physician.   A blood pressure cuff, EKG and other monitors will often be applied during the procedure.  Some patients may need to have extra oxygen administered for a short period. You will be asked to provide medical information, including your allergies, prior to the procedure.  We must know immediately if you are taking blood thinners (like Coumadin/Warfarin)  Or if you are allergic to  IV iodine contrast (dye). We must know if you could possible be pregnant.  Possible side-effects: Bleeding from needle site Infection (rare, may require surgery) Nerve injury (rare) Numbness & tingling (temporary) Difficulty urinating (rare, temporary) Spinal headache ( a headache worse with upright posture) Light -headedness (temporary) Pain at injection site (several days) Decreased blood pressure (temporary) Weakness in arm/leg (temporary) Pressure sensation in back/neck (temporary)  Call if you experience: Fever/chills associated with headache or increased back/neck pain. Headache worsened by an upright position. New onset weakness or numbness of an extremity below the injection site Hives or difficulty breathing (go to the emergency room) Inflammation or drainage at the infection site Severe back/neck pain Any new symptoms which are concerning to you  Please note:  Although the local anesthetic injected can often make your back or neck feel good for several hours after the injection, the pain will likely return.  It takes 3-7 days for steroids to work in the epidural space.  You may not notice any pain relief for at least that one week.  If effective, we will often do a series of three injections spaced 3-6 weeks apart to maximally decrease your pain.  After the initial series, we generally will wait several months before considering a repeat injection of the same type.  If you have any questions, please call [phone] [facility] Patient Information  Description: The facets are joints in the spine between the vertebrae.  Like any joints in the body, facets can become irritated and painful.  Arthritis can also effect the facets.  By injecting steroids and local anesthetic in and around these joints, we can temporarily block the nerve supply to them.  Steroids act directly on irritated nerves and tissues to reduce selling and  inflammation which often leads to decreased pain.  Facet blocks may be done anywhere along the spine from the neck to the low back depending upon the location of your pain.   After numbing the skin with local anesthetic (like Novocaine), a small needle is passed onto the facet joints under x-ray guidance.  You may experience a sensation of pressure while this is being done.  The entire block usually lasts about 15-25 minutes.   Conditions which may be treated by facet blocks:  Low back/buttock pain Neck/shoulder  pain Certain types of headaches  Preparation for the injection:  Do not eat any solid food or dairy products within 8 hours of your appointment. You may drink clear liquid up to 3 hours before appointment.  Clear liquids include water, black coffee, juice or soda.  No milk or cream please. You may take your regular medication, including pain medications, with a sip of water before your appointment.  Diabetics should hold regular insulin (if taken separately) and take 1/2 normal NPH dose the morning of the procedure.  Carry some sugar containing items with you to your appointment. A driver must accompany you and be prepared to drive you home after your procedure. Bring all your current medications with you. An IV may be inserted and sedation may be given at the discretion of the physician. A blood pressure cuff, EKG and other monitors will often be applied during the procedure.  Some patients may need to have extra oxygen administered for a short period. You will be asked to provide medical information, including your allergies and medications, prior to the procedure.  We must know immediately if you are taking blood thinners (like Coumadin/Warfarin) or if you are allergic to IV iodine contrast (dye).  We must know if you could possible be pregnant.  Possible side-effects:  Bleeding from needle site Infection (rare, may require surgery) Nerve injury (rare) Numbness & tingling  (temporary) Difficulty urinating (rare, temporary) Spinal headache (a headache worse with upright posture) Light-headedness (temporary) Pain at injection site (serveral days) Decreased blood pressure (rare, temporary) Weakness in arm/leg (temporary) Pressure sensation in back/neck (temporary)   Call if you experience:  Fever/chills associated with headache or increased back/neck pain Headache worsened by an upright position New onset, weakness or numbness of an extremity below the injection site Hives or difficulty breathing (go to the emergency room) Inflammation or drainage at the injection site(s) Severe back/neck pain greater than usual New symptoms which are concerning to you  Please note:  Although the local anesthetic injected can often make your back or neck feel good for several hours after the injection, the pain will likely return. It takes 3-7 days for steroids to work.  You may not notice any pain relief for at least one week.  If effective, we will often do a series of 2-3 injections spaced 3-6 weeks apart to maximally decrease your pain.  After the initial series, you may be a candidate for a more permanent nerve block of the facets.  If you have any questions, please call #[phone] . [facility]

## 2017-03-23 NOTE — Patient Instructions (Addendum)
____________________________________________________________________________________________  Preparing for Procedure with Sedation Instructions: . Oral Intake: Do not eat or drink anything for at least 8 hours prior to your procedure. . Transportation: Public transportation is not allowed. Bring an adult driver. The driver must be physically present in our waiting room before any procedure can be started. Marland Kitchen Physical Assistance: Bring an adult physically capable of assisting you, in the event you need help. This adult should keep you company at home for at least 6 hours after the procedure. . Blood Pressure Medicine: Take your blood pressure medicine with a sip of water the morning of the procedure. . Blood thinners:  . Diabetics on insulin: Notify the staff so that you can be scheduled 1st case in the morning. If your diabetes requires high dose insulin, take only  of your normal insulin dose the morning of the procedure and notify the staff that you have done so. . Preventing infections: Shower with an antibacterial soap the morning of your procedure. . Build-up your immune system: Take 1000 mg of Vitamin C with every meal (3 times a day) the day prior to your procedure. Marland Kitchen Antibiotics: Inform the staff if you have a condition or reason that requires you to take antibiotics before dental procedures. . Pregnancy: If you are pregnant, call and cancel the procedure. . Sickness: If you have a cold, fever, or any active infections, call and cancel the procedure. . Arrival: You must be in the facility at least 30 minutes prior to your scheduled procedure. . Children: Do not bring children with you. . Dress appropriately: Bring dark clothing that you would not mind if they get stained. . Valuables: Do not bring any jewelry or valuables. Procedure appointments are reserved for interventional treatments only. Marland Kitchen No Prescription Refills. . No medication changes will be discussed during procedure  appointments. . No disability issues will be discussed. ______________________________________________________________________________________________  Preparing for Procedure with Sedation Instructions: . Oral Intake: Do not eat or drink anything for at least 8 hours prior to your procedure. . Transportation: Public transportation is not allowed. Bring an adult driver. The driver must be physically present in our waiting room before any procedure can be started. Marland Kitchen Physical Assistance: Bring an adult capable of physically assisting you, in the event you need help. . Blood Pressure Medicine: Take your blood pressure medicine with a sip of water the morning of the procedure. . Insulin: Take only  of your normal insulin dose. . Preventing infections: Shower with an antibacterial soap the morning of your procedure. . Build-up your immune system: Take 1000 mg of Vitamin C with every meal (3 times a day) the day prior to your procedure. . Pregnancy: If you are pregnant, call and cancel the procedure. . Sickness: If you have a cold, fever, or any active infections, call and cancel the procedure. . Arrival: You must be in the facility at least 30 minutes prior to your scheduled procedure. . Children: Do not bring children with you. . Dress appropriately: Bring dark clothing that you would not mind if they get stained. . Valuables: Do not bring any jewelry or valuables. Procedure appointments are reserved for interventional treatments only. Marland Kitchen No Prescription Refills. . No medication changes will be discussed during procedure appointments. No disability issues will be discussed.Epidural Steroid Injection Patient Information  Description: The epidural space surrounds the nerves as they exit the spinal cord.  In some patients, the nerves can be compressed and inflamed by a bulging disc or a tight spinal canal (  spinal stenosis).  By injecting steroids into the epidural space, we can bring irritated  nerves into direct contact with a potentially helpful medication.  These steroids act directly on the irritated nerves and can reduce swelling and inflammation which often leads to decreased pain.  Epidural steroids may be injected anywhere along the spine and from the neck to the low back depending upon the location of your pain.   After numbing the skin with local anesthetic (like Novocaine), a small needle is passed into the epidural space slowly.  You may experience a sensation of pressure while this is being done.  The entire block usually last less than 10 minutes.  Conditions which may be treated by epidural steroids:  Low back and leg pain Neck and arm pain Spinal stenosis Post-laminectomy syndrome Herpes zoster (shingles) pain Pain from compression fractures  Preparation for the injection:  Do not eat any solid food or dairy products within 8 hours of your appointment.  You may drink clear liquids up to 3 hours before appointment.  Clear liquids include water, black coffee, juice or soda.  No milk or cream please. You may take your regular medication, including pain medications, with a sip of water before your appointment  Diabetics should hold regular insulin (if taken separately) and take 1/2 normal NPH dos the morning of the procedure.  Carry some sugar containing items with you to your appointment. A driver must accompany you and be prepared to drive you home after your procedure.  Bring all your current medications with your. An IV may be inserted and sedation may be given at the discretion of the physician.   A blood pressure cuff, EKG and other monitors will often be applied during the procedure.  Some patients may need to have extra oxygen administered for a short period. You will be asked to provide medical information, including your allergies, prior to the procedure.  We must know immediately if you are taking blood thinners (like Coumadin/Warfarin)  Or if you are allergic to  IV iodine contrast (dye). We must know if you could possible be pregnant.  Possible side-effects: Bleeding from needle site Infection (rare, may require surgery) Nerve injury (rare) Numbness & tingling (temporary) Difficulty urinating (rare, temporary) Spinal headache ( a headache worse with upright posture) Light -headedness (temporary) Pain at injection site (several days) Decreased blood pressure (temporary) Weakness in arm/leg (temporary) Pressure sensation in back/neck (temporary)  Call if you experience: Fever/chills associated with headache or increased back/neck pain. Headache worsened by an upright position. New onset weakness or numbness of an extremity below the injection site Hives or difficulty breathing (go to the emergency room) Inflammation or drainage at the infection site Severe back/neck pain Any new symptoms which are concerning to you  Please note:  Although the local anesthetic injected can often make your back or neck feel good for several hours after the injection, the pain will likely return.  It takes 3-7 days for steroids to work in the epidural space.  You may not notice any pain relief for at least that one week.  If effective, we will often do a series of three injections spaced 3-6 weeks apart to maximally decrease your pain.  After the initial series, we generally will wait several months before considering a repeat injection of the same type.  If you have any questions, please call [phone] [facility] Patient Information  Description: The facets are joints in the spine between the  vertebrae.  Like any joints in the body, facets can become irritated and painful.  Arthritis can also effect the facets.  By injecting steroids and local anesthetic in and around these joints, we can temporarily block the nerve supply to them.  Steroids act directly on irritated nerves and tissues to reduce selling and  inflammation which often leads to decreased pain.  Facet blocks may be done anywhere along the spine from the neck to the low back depending upon the location of your pain.   After numbing the skin with local anesthetic (like Novocaine), a small needle is passed onto the facet joints under x-ray guidance.  You may experience a sensation of pressure while this is being done.  The entire block usually lasts about 15-25 minutes.   Conditions which may be treated by facet blocks:  Low back/buttock pain Neck/shoulder pain Certain types of headaches  Preparation for the injection:  Do not eat any solid food or dairy products within 8 hours of your appointment. You may drink clear liquid up to 3 hours before appointment.  Clear liquids include water, black coffee, juice or soda.  No milk or cream please. You may take your regular medication, including pain medications, with a sip of water before your appointment.  Diabetics should hold regular insulin (if taken separately) and take 1/2 normal NPH dose the morning of the procedure.  Carry some sugar containing items with you to your appointment. A driver must accompany you and be prepared to drive you home after your procedure. Bring all your current medications with you. An IV may be inserted and sedation may be given at the discretion of the physician. A blood pressure cuff, EKG and other monitors will often be applied during the procedure.  Some patients may need to have extra oxygen administered for a short period. You will be asked to provide medical information, including your allergies and medications, prior to the procedure.  We must know immediately if you are taking blood thinners (like Coumadin/Warfarin) or if you are allergic to IV iodine contrast (dye).  We must know if you could possible be pregnant.  Possible side-effects:  Bleeding from needle site Infection (rare, may require surgery) Nerve injury (rare) Numbness & tingling  (temporary) Difficulty urinating (rare, temporary) Spinal headache (a headache worse with upright posture) Light-headedness (temporary) Pain at injection site (serveral days) Decreased blood pressure (rare, temporary) Weakness in arm/leg (temporary) Pressure sensation in back/neck (temporary)   Call if you experience:  Fever/chills associated with headache or increased back/neck pain Headache worsened by an upright position New onset, weakness or numbness of an extremity below the injection site Hives or difficulty breathing (go to the emergency room) Inflammation or drainage at the injection site(s) Severe back/neck pain greater than usual New symptoms which are concerning to you  Please note:  Although the local anesthetic injected can often make your back or neck feel good for several hours after the injection, the pain will likely return. It takes 3-7 days for steroids to work.  You may not notice any pain relief for at least one week.  If effective, we will often do a series of 2-3 injections spaced 3-6 weeks apart to maximally decrease your pain.  After the initial series, you may be a candidate for a more permanent nerve block of the facets.  If you have any questions, please call #[phone] . [facility]

## 2017-04-01 ENCOUNTER — Telehealth: Payer: Self-pay | Admitting: Pain Medicine

## 2017-04-01 NOTE — Telephone Encounter (Signed)
Patient called stating he recvd phone msg about June 6 appt. Procedure.  He cannot come in for 2 weeks due to work. Would like Blanch Media to call him when she receives approval from ins for procedure.

## 2017-04-03 ENCOUNTER — Other Ambulatory Visit: Payer: Self-pay | Admitting: Unknown Physician Specialty

## 2017-04-06 ENCOUNTER — Ambulatory Visit: Payer: 59 | Admitting: Pain Medicine

## 2017-04-28 ENCOUNTER — Encounter: Payer: Self-pay | Admitting: Nurse Practitioner

## 2017-04-28 ENCOUNTER — Ambulatory Visit: Payer: 59 | Attending: Nurse Practitioner | Admitting: Nurse Practitioner

## 2017-04-28 VITALS — BP 128/74 | HR 71 | Temp 98.0°F | Resp 16 | Ht 67.0 in | Wt 184.0 lb

## 2017-04-28 DIAGNOSIS — E785 Hyperlipidemia, unspecified: Secondary | ICD-10-CM | POA: Insufficient documentation

## 2017-04-28 DIAGNOSIS — M4802 Spinal stenosis, cervical region: Secondary | ICD-10-CM | POA: Insufficient documentation

## 2017-04-28 DIAGNOSIS — M4692 Unspecified inflammatory spondylopathy, cervical region: Secondary | ICD-10-CM | POA: Diagnosis not present

## 2017-04-28 DIAGNOSIS — K2970 Gastritis, unspecified, without bleeding: Secondary | ICD-10-CM | POA: Insufficient documentation

## 2017-04-28 DIAGNOSIS — Z79891 Long term (current) use of opiate analgesic: Secondary | ICD-10-CM | POA: Diagnosis not present

## 2017-04-28 DIAGNOSIS — M5030 Other cervical disc degeneration, unspecified cervical region: Secondary | ICD-10-CM | POA: Diagnosis not present

## 2017-04-28 DIAGNOSIS — M47812 Spondylosis without myelopathy or radiculopathy, cervical region: Secondary | ICD-10-CM | POA: Diagnosis not present

## 2017-04-28 DIAGNOSIS — M545 Low back pain: Secondary | ICD-10-CM

## 2017-04-28 DIAGNOSIS — M25512 Pain in left shoulder: Secondary | ICD-10-CM

## 2017-04-28 DIAGNOSIS — E559 Vitamin D deficiency, unspecified: Secondary | ICD-10-CM | POA: Insufficient documentation

## 2017-04-28 DIAGNOSIS — M5412 Radiculopathy, cervical region: Secondary | ICD-10-CM | POA: Diagnosis not present

## 2017-04-28 DIAGNOSIS — G8929 Other chronic pain: Secondary | ICD-10-CM | POA: Diagnosis not present

## 2017-04-28 DIAGNOSIS — M533 Sacrococcygeal disorders, not elsewhere classified: Secondary | ICD-10-CM | POA: Insufficient documentation

## 2017-04-28 DIAGNOSIS — M542 Cervicalgia: Secondary | ICD-10-CM | POA: Insufficient documentation

## 2017-04-28 DIAGNOSIS — G894 Chronic pain syndrome: Secondary | ICD-10-CM | POA: Diagnosis not present

## 2017-04-28 DIAGNOSIS — M479 Spondylosis, unspecified: Secondary | ICD-10-CM | POA: Insufficient documentation

## 2017-04-28 DIAGNOSIS — M62838 Other muscle spasm: Secondary | ICD-10-CM | POA: Insufficient documentation

## 2017-04-28 DIAGNOSIS — M25511 Pain in right shoulder: Secondary | ICD-10-CM

## 2017-04-28 DIAGNOSIS — I10 Essential (primary) hypertension: Secondary | ICD-10-CM | POA: Diagnosis not present

## 2017-04-28 DIAGNOSIS — M4803 Spinal stenosis, cervicothoracic region: Secondary | ICD-10-CM | POA: Diagnosis not present

## 2017-04-28 MED ORDER — HYDROCODONE-ACETAMINOPHEN 5-325 MG PO TABS: 1.0000 | ORAL_TABLET | Freq: Every day | ORAL | 0 refills | Status: DC | PRN

## 2017-04-28 MED ORDER — BACLOFEN 10 MG PO TABS: 10.0000 mg | ORAL_TABLET | Freq: Every day | ORAL | 0 refills | Status: DC

## 2017-04-28 NOTE — Patient Instructions (Addendum)
____________________________________________________________________________________________  Medication Rules  Applies to: All patients receiving prescriptions (written or electronic).  Pharmacy of record: Pharmacy where electronic prescriptions will be sent. If written prescriptions are taken to a different pharmacy, please inform the nursing staff. The pharmacy listed in the electronic medical record should be the one where you would like electronic prescriptions to be sent.  Prescription refills: Only during scheduled appointments. Applies to both, written and electronic prescriptions.  NOTE: The following applies primarily to controlled substances (Opioid* Pain Medications).   Patient's responsibilities: 1. Pain Pills: Bring all pain pills to every appointment (except for procedure appointments). 2. Pill Bottles: Bring pills in original pharmacy bottle. Always bring newest bottle. Bring bottle, even if empty. 3. Medication refills: You are responsible for knowing and keeping track of what medications you need refilled. The day before your appointment, write a list of all prescriptions that need to be refilled. Bring that list to your appointment and give it to the admitting nurse. Prescriptions will be written only during appointments. If you forget a medication, it will not be "Called in", "Faxed", or "electronically sent". You will need to get another appointment to get these prescribed. 4. Prescription Accuracy: You are responsible for carefully inspecting your prescriptions before leaving our office. Have the discharge nurse carefully go over each prescription with you, before taking them home. Make sure that your name is accurately spelled, that your address is correct. Check the name and dose of your medication to make sure it is accurate. Check the number of pills, and the written instructions to make sure they are clear and accurate. Make sure that you are given enough medication to  last until your next medication refill appointment. 5. Taking Medication: Take medication as prescribed. Never take more pills than instructed. Never take medication more frequently than prescribed. Taking less pills or less frequently is permitted and encouraged, when it comes to controlled substances (written prescriptions).  6. Inform other Doctors: Always inform, all of your healthcare providers, of all the medications you take. 7. Pain Medication from other Providers: You are not allowed to accept any additional pain medication from any other Doctor or Healthcare provider. There are two exceptions to this rule. (see below) In the event that you require additional pain medication, you are responsible for notifying us, as stated below. 8. Medication Agreement: You are responsible for carefully reading and following our Medication Agreement. This must be signed before receiving any prescriptions from our practice. Safely store a copy of your signed Agreement. Violations to the Agreement will result in no further prescriptions. (Additional copies of our Medication Agreement are available upon request.) 9. Laws, Rules, & Regulations: All patients are expected to follow all Federal and State Laws, Statutes, Rules, & Regulations. Ignorance of the Laws does not constitute a valid excuse. The use of any illegal substances is prohibited. 10. Adopted CDC guidelines & recommendations: Target dosing levels will be at or below 60 MME/day. Use of benzodiazepines** is not recommended.  Exceptions: There are only two exceptions to the rule of not receiving pain medications from other Healthcare Providers. 1. Exception #1 (Emergencies): In the event of an emergency (i.e.: accident requiring emergency care), you are allowed to receive additional pain medication. However, you are responsible for: As soon as you are able, call our office [phone], at any time of the day or night, and leave a message stating your  name, the date and nature of the emergency, and the name and dose of the medication   prescribed. In the event that your call is answered by a member of our staff, make sure to document and save the date, time, and the name of the person that took your information.  2. Exception #2 (Planned Surgery): In the event that you are scheduled by another doctor or dentist to have any type of surgery or procedure, you are allowed (for a period no longer than 30 days), to receive additional pain medication, for the acute post-op pain. However, in this case, you are responsible for picking up a copy of our "Post-op Pain Management for Surgeons" handout, and giving it to your surgeon or dentist. This document is available at our office, and does not require an appointment to obtain it. Simply go to our office during business hours (Monday-Thursday from 8:00 AM to 4:00 PM) (Friday 8:00 AM to 12:00 Noon) or if you have a scheduled appointment with us, prior to your surgery, and ask for it by name. In addition, you will need to provide us with your name, name of your surgeon, type of surgery, and date of procedure or surgery.  *Opioid medications include: morphine, codeine, oxycodone, oxymorphone, hydrocodone, hydromorphone, meperidine, tramadol, tapentadol, buprenorphine, fentanyl, methadone. **Benzodiazepine medications include: diazepam (Valium), alprazolam (Xanax), clonazepam (Klonopine), lorazepam (Ativan), clorazepate (Tranxene), chlordiazepoxide (Librium), estazolam (Prosom), oxazepam (Serax), temazepam (Restoril), triazolam (Halcion)  ____________________________________________________________________________________________ GENERAL RISKS AND COMPLICATIONS  What are the risk, side effects and possible complications? Generally speaking, most procedures are safe.  However, with any procedure there are risks, side effects, and the possibility of complications.  The risks and complications are dependent upon the sites  that are lesioned, or the type of nerve block to be performed.  The closer the procedure is to the spine, the more serious the risks are.  Great care is taken when placing the radio frequency needles, block needles or lesioning probes, but sometimes complications can occur. 1. Infection: Any time there is an injection through the skin, there is a risk of infection.  This is why sterile conditions are used for these blocks.  There are four possible types of infection. 1. Localized skin infection. 2. Central Nervous System Infection-This can be in the form of Meningitis, which can be deadly. 3. Epidural Infections-This can be in the form of an epidural abscess, which can cause pressure inside of the spine, causing compression of the spinal cord with subsequent paralysis. This would require an emergency surgery to decompress, and there are no guarantees that the patient would recover from the paralysis. 4. Discitis-This is an infection of the intervertebral discs.  It occurs in about 1% of discography procedures.  It is difficult to treat and it may lead to surgery.        2. Pain: the needles have to go through skin and soft tissues, will cause soreness.       3. Damage to internal structures:  The nerves to be lesioned may be near blood vessels or    other nerves which can be potentially damaged.       4. Bleeding: Bleeding is more common if the patient is taking blood thinners such as  aspirin, Coumadin, Ticiid, Plavix, etc., or if he/she have some genetic predisposition  such as hemophilia. Bleeding into the spinal canal can cause compression of the spinal  cord with subsequent paralysis.  This would require an emergency surgery to  decompress and there are no guarantees that the patient would recover from the  paralysis.       5.   Pneumothorax:  Puncturing of a lung is a possibility, every time a needle is introduced in  the area of the chest or upper back.  Pneumothorax refers to free air around the   collapsed lung(s), inside of the thoracic cavity (chest cavity).  Another two possible  complications related to a similar event would include: Hemothorax and Chylothorax.   These are variations of the Pneumothorax, where instead of air around the collapsed  lung(s), you may have blood or chyle, respectively.       6. Spinal headaches: They may occur with any procedures in the area of the spine.       7. Persistent CSF (Cerebro-Spinal Fluid) leakage: This is a rare problem, but may occur  with prolonged intrathecal or epidural catheters either due to the formation of a fistulous  track or a dural tear.       8. Nerve damage: By working so close to the spinal cord, there is always a possibility of  nerve damage, which could be as serious as a permanent spinal cord injury with  paralysis.       9. Death:  Although rare, severe deadly allergic reactions known as "Anaphylactic  reaction" can occur to any of the medications used.      10. Worsening of the symptoms:  We can always make thing worse.  What are the chances of something like this happening? Chances of any of this occuring are extremely low.  By statistics, you have more of a chance of getting killed in a motor vehicle accident: while driving to the hospital than any of the above occurring .  Nevertheless, you should be aware that they are possibilities.  In general, it is similar to taking a shower.  Everybody knows that you can slip, hit your head and get killed.  Does that mean that you should not shower again?  Nevertheless always keep in mind that statistics do not mean anything if you happen to be on the wrong side of them.  Even if a procedure has a 1 (one) in a 1,000,000 (million) chance of going wrong, it you happen to be that one..Also, keep in mind that by statistics, you have more of a chance of having something go wrong when taking medications.  Who should not have this procedure? If you are on a blood thinning medication (e.g.  Coumadin, Plavix, see list of "Blood Thinners"), or if you have an active infection going on, you should not have the procedure.  If you are taking any blood thinners, please inform your physician.  How should I prepare for this procedure?  Do not eat or drink anything at least six hours prior to the procedure.  Bring a driver with you .  It cannot be a taxi.  Come accompanied by an adult that can drive you back, and that is strong enough to help you if your legs get weak or numb from the local anesthetic.  Take all of your medicines the morning of the procedure with just enough water to swallow them.  If you have diabetes, make sure that you are scheduled to have your procedure done first thing in the morning, whenever possible.  If you have diabetes, take only half of your insulin dose and notify our nurse that you have done so as soon as you arrive at the clinic.  If you are diabetic, but only take blood sugar pills (oral hypoglycemic), then do not take them on the morning of your procedure.    You may take them after you have had the procedure.  Do not take aspirin or any aspirin-containing medications, at least eleven (11) days prior to the procedure.  They may prolong bleeding.  Wear loose fitting clothing that may be easy to take off and that you would not mind if it got stained with Betadine or blood.  Do not wear any jewelry or perfume  Remove any nail coloring.  It will interfere with some of our monitoring equipment.  NOTE: Remember that this is not meant to be interpreted as a complete list of all possible complications.  Unforeseen problems may occur.  BLOOD THINNERS The following drugs contain aspirin or other products, which can cause increased bleeding during surgery and should not be taken for 2 weeks prior to and 1 week after surgery.  If you should need take something for relief of minor pain, you may take acetaminophen which is found in Tylenol,m Datril, Anacin-3 and  Panadol. It is not blood thinner. The products listed below are.  Do not take any of the products listed below in addition to any listed on your instruction sheet.  A.P.C or A.P.C with Codeine Codeine Phosphate Capsules #3 Ibuprofen Ridaura  ABC compound Congesprin Imuran rimadil  Advil Cope Indocin Robaxisal  Alka-Seltzer Effervescent Pain Reliever and Antacid Coricidin or Coricidin-D  Indomethacin Rufen  Alka-Seltzer plus Cold Medicine Cosprin Ketoprofen S-A-C Tablets  Anacin Analgesic Tablets or Capsules Coumadin Korlgesic Salflex  Anacin Extra Strength Analgesic tablets or capsules CP-2 Tablets Lanoril Salicylate  Anaprox Cuprimine Capsules Levenox Salocol  Anexsia-D Dalteparin Magan Salsalate  Anodynos Darvon compound Magnesium Salicylate Sine-off  Ansaid Dasin Capsules Magsal Sodium Salicylate  Anturane Depen Capsules Marnal Soma  APF Arthritis pain formula Dewitt's Pills Measurin Stanback  Argesic Dia-Gesic Meclofenamic Sulfinpyrazone  Arthritis Bayer Timed Release Aspirin Diclofenac Meclomen Sulindac  Arthritis pain formula Anacin Dicumarol Medipren Supac  Analgesic (Safety coated) Arthralgen Diffunasal Mefanamic Suprofen  Arthritis Strength Bufferin Dihydrocodeine Mepro Compound Suprol  Arthropan liquid Dopirydamole Methcarbomol with Aspirin Synalgos  ASA tablets/Enseals Disalcid Micrainin Tagament  Ascriptin Doan's Midol Talwin  Ascriptin A/D Dolene Mobidin Tanderil  Ascriptin Extra Strength Dolobid Moblgesic Ticlid  Ascriptin with Codeine Doloprin or Doloprin with Codeine Momentum Tolectin  Asperbuf Duoprin Mono-gesic Trendar  Aspergum Duradyne Motrin or Motrin IB Triminicin  Aspirin plain, buffered or enteric coated Durasal Myochrisine Trigesic  Aspirin Suppositories Easprin Nalfon Trillsate  Aspirin with Codeine Ecotrin Regular or Extra Strength Naprosyn Uracel  Atromid-S Efficin Naproxen Ursinus  Auranofin Capsules Elmiron Neocylate Vanquish  Axotal Emagrin Norgesic  Verin  Azathioprine Empirin or Empirin with Codeine Normiflo Vitamin E  Azolid Emprazil Nuprin Voltaren  Bayer Aspirin plain, buffered or children's or timed BC Tablets or powders Encaprin Orgaran Warfarin Sodium  Buff-a-Comp Enoxaparin Orudis Zorpin  Buff-a-Comp with Codeine Equegesic Os-Cal-Gesic   Buffaprin Excedrin plain, buffered or Extra Strength Oxalid   Bufferin Arthritis Strength Feldene Oxphenbutazone   Bufferin plain or Extra Strength Feldene Capsules Oxycodone with Aspirin   Bufferin with Codeine Fenoprofen Fenoprofen Pabalate or Pabalate-SF   Buffets II Flogesic Panagesic   Buffinol plain or Extra Strength Florinal or Florinal with Codeine Panwarfarin   Buf-Tabs Flurbiprofen Penicillamine   Butalbital Compound Four-way cold tablets Penicillin   Butazolidin Fragmin Pepto-Bismol   Carbenicillin Geminisyn Percodan   Carna Arthritis Reliever Geopen Persantine   Carprofen Gold's salt Persistin   Chloramphenicol Goody's Phenylbutazone   Chloromycetin Haltrain Piroxlcam   Clmetidine heparin Plaquenil   Cllnoril Hyco-pap Ponstel   Clofibrate Hydroxy chloroquine   Propoxyphen         Before stopping any of these medications, be sure to consult the physician who ordered them.  Some, such as Coumadin (Warfarin) are ordered to prevent or treat serious conditions such as "deep thrombosis", "pumonary embolisms", and other heart problems.  The amount of time that you may need off of the medication may also vary with the medication and the reason for which you were taking it.  If you are taking any of these medications, please make sure you notify your pain physician before you undergo any procedures.            

## 2017-04-28 NOTE — Progress Notes (Signed)
Patient's Name: Jose Washington  MRN: [id number]  Referring Provider: Kathrine Haddock, NP  DOB: [birth year]  PCP: Kathrine Haddock, NP  DOS: 04/28/2017  Note by: Vevelyn Francois NP  Service setting: Ambulatory outpatient  Specialty: Interventional Pain Management  Location: [facility] (AMB) Pain Management Facility    Patient type: Established    Primary Reason(s) for Visit: Encounter for prescription drug management (Level of risk: moderate) CC: Neck Pain (left) and Back Pain (lower)  HPI  Jose Washington is a 51 y.o. year old, male patient, who comes today for a medication management evaluation. He has Chronic neck pain (Location of Primary Source of Pain) (Bilateral) (L>R); Hyperlipidemia; Essential hypertension, benign; Gastritis; Chronic pain syndrome; Chronic low back pain (Location of Secondary source of pain) (Bilateral) (L>R); Chronic upper back pain (Location of Tertiary source of pain) (Bilateral) (L>R); Chronic shoulder pain (Bilateral) (L>R); Osteoarthritis of AC (acromioclavicular) joint (Right); Chronic sacroiliac joint pain (Bilateral) (L>R); Long term current use of opiate analgesic; Long term prescription opiate use; Opiate use (5 MME/Day); Muscle spasticity; Cervical DDD (C4-5, C5-6, C6-7 and C7-T1); Cervical foraminal stenosis (Bilateral: C5-6 & C6-7, Left: C4-5 & C7-T1); Cervical radiculitis (Bilateral) (L>R); Vitamin D insufficiency; Cervical facet syndrome (HCC) (L); Cervical spondylosis; and Musculoskeletal neck pain (trapezius) (Left) on his problem list. His primarily concern today is the Neck Pain (left) and Back Pain (lower)  Pain Assessment: Location: Left Neck Radiating: neck pain radiates to upper arm when streching Onset: More than a month ago Duration: Chronic pain Quality: Nagging, Constant, Discomfort Severity: 2 /10 (self-reported pain score)  Note: Reported level is compatible with observation.                   Effect on ADL: unable to turn neck like he should and left  heavy items Timing: Constant Modifying factors: medications, rest, streching  Jose Washington was last scheduled for an appointment on 01/27/17 for medication management. During today's appointment we reviewed Jose Washington chronic pain status, as well as his outpatient medication regimen. He has chronic neck pain. He is wanting a facet  nerve block. He admits that he received significantly with his last cervical epidural steroid injection.  The patient  reports that he does not use drugs. His body mass index is 28.82 kg/m.  Further details on both, my assessment(s), as well as the proposed treatment plan, please see below.  Controlled Substance Pharmacotherapy Assessment REMS (Risk Evaluation and Mitigation Strategy)  Analgesic:Hydrocodone/APAP 5/325 one daily MME/day:5m/day No notes on file Pharmacokinetics: Liberation and absorption (onset of action): WNL Distribution (time to peak effect): WNL Metabolism and excretion (duration of action): WNL         Pharmacodynamics: Desired effects: Analgesia: Mr. SMohsreports >50% benefit. Functional ability: Patient reports that medication allows him to accomplish basic ADLs Clinically meaningful improvement in function (CMIF): Sustained CMIF goals met Perceived effectiveness: Described as relatively effective, allowing for increase in activities of daily living (ADL) Undesirable effects: Side-effects or Adverse reactions: None reported Monitoring: Jose Washington: Online review of the past 148-montheriod conducted. Compliant with practice rules and regulations List of all UDS test(s) done:  Lab Results  Component Value Date   [place] 01/27/2017   SUMMARY FINAL 11/03/2016   Last UDS on record: [facility] Select 13  Date Value Ref Range Status  01/27/2017 FINAL  Final    Comment:    ==================================================================== TOXASSURE SELECT 13  (MW) ==================================================================== Test  Result       Flag       Units Drug Present and Declared for Prescription Verification   Hydrocodone                    48           EXPECTED   ng/mg creat   Norhydrocodone                 119          EXPECTED   ng/mg creat    Sources of hydrocodone include scheduled prescription    medications. Norhydrocodone is an expected metabolite of    hydrocodone. ==================================================================== Test                      Result    Flag   Units      Ref Range   Creatinine              208              mg/dL      >=20 ==================================================================== Declared Medications:  The flagging and interpretation on this report are based on the  following declared medications.  Unexpected results may arise from  inaccuracies in the declared medications.  **Note: The testing scope of this panel includes these medications:  Hydrocodone (Hydrocodone-Acetaminophen)  **Note: The testing scope of this panel does not include following  reported medications:  Acetaminophen  Acetaminophen (Hydrocodone-Acetaminophen)  Atenolol  Atorvastatin  Baclofen  Buspirone  Cholecalciferol  Chondroitin  Chondroitin (Glucosamine-Chondroitin)  Cyanocobalamin  Desloratadine  Diphenhydramine  Duloxetine  Fenofibrate  Fenoprofen  Fluticasone  Glucosamine (Glucosamine-Chondroitin)  Iron (Ferrous Sulfate)  Melatonin  Methylsulfonylmethane  Multivitamin  Omeprazole  Ondansetron  Potassium  Pseudoephedrine  Turmeric ==================================================================== For clinical consultation, please call [phone]. ====================================================================    Summary  Date Value Ref Range Status  11/03/2016 FINAL  Final    Comment:     ==================================================================== TOXASSURE COMP DRUG ANALYSIS,UR ==================================================================== Test                             Result       Flag       Units Drug Present and Declared for Prescription Verification   Baclofen                       PRESENT      EXPECTED   Duloxetine                     PRESENT      EXPECTED   Acetaminophen                  PRESENT      EXPECTED   Atenolol                       PRESENT      EXPECTED Drug Present not Declared for Prescription Verification   Ephedrine/Pseudoephedrine      PRESENT      UNEXPECTED   Phenylpropanolamine            PRESENT      UNEXPECTED    Source of ephedrine/pseudoephedrine is most commonly    pseudoephedrine in over-the-counter or prescription cold and    allergy medications. Phenylpropanolamine is an expected    metabolite of ephedrine/pseudoephedrine. Drug Absent but Declared  for Prescription Verification   Hydrocodone                    Not Detected UNEXPECTED ng/mg creat   Cyclobenzaprine                Not Detected UNEXPECTED ==================================================================== Test                      Result    Flag   Units      Ref Range   Creatinine              232              mg/dL      >=20 ==================================================================== Declared Medications:  The flagging and interpretation on this report are based on the  following declared medications.  Unexpected results may arise from  inaccuracies in the declared medications.  **Note: The testing scope of this panel includes these medications:  Atenolol (Tenormin)  Baclofen (Lioresal)  Cyclobenzaprine (Flexeril)  Duloxetine (Cymbalta)  Hydrocodone (Norco)  **Note: The testing scope of this panel does not include small to  moderate amounts of these reported medications:  Acetaminophen (Norco)  Acetaminophen (Tylenol)  **Note: The testing  scope of this panel does not include following  reported medications:  Atorvastatin (Lipitor)  Buspirone (BuSpar)  Chondroitin  Chondroitin (Glucosamine-Chondroitin)  Cyanocobalamin  Fenofibrate  Fluticasone (Flonase)  Glucosamine (Glucosamine-Chondroitin)  Iron (Ferrous Sulfate)  Melatonin  Methylsulfonylmethane  Multivitamin  Omeprazole (Prilosec)  Ondansetron (Zofran)  Turmeric ==================================================================== For clinical consultation, please call [phone]. ====================================================================    UDS interpretation: Compliant          Medication Assessment Form: Reviewed. Patient indicates being compliant with therapy Treatment compliance: Compliant Risk Assessment Profile: Aberrant behavior: See prior evaluations. None observed or detected today Comorbid factors increasing risk of overdose: See prior notes. No additional risks detected today Risk of substance use disorder (SUD): Low Opioid Risk Tool (ORT) Total Score: 1  Interpretation Table:  Score <3 = Low Risk for SUD  Score between 4-7 = Moderate Risk for SUD  Score >8 = High Risk for Opioid Abuse   Risk Mitigation Strategies:  Patient Counseling: Covered Patient-Prescriber Agreement (PPA): Present and active  Notification to other healthcare providers: Done  Pharmacologic Plan: No change in therapy, at this time  Laboratory Chemistry  Inflammation Markers Lab Results  Component Value Date   CRP 1.4 (H) 11/12/2016   ESRSEDRATE 3 11/12/2016   (CRP: Acute Phase) (ESR: Chronic Phase) Renal Function Markers Lab Results  Component Value Date   BUN 14 02/21/2017   CREATININE 1.15 02/21/2017   GFRAA 85 02/21/2017   GFRNONAA 74 02/21/2017   Hepatic Function Markers Lab Results  Component Value Date   AST 35 02/21/2017   ALT 53 (H) 02/21/2017   ALBUMIN 4.9 02/21/2017   ALKPHOS 56 02/21/2017   Electrolytes Lab Results   Component Value Date   NA 146 (H) 02/21/2017   K 4.7 02/21/2017   CL 106 02/21/2017   CALCIUM 9.8 02/21/2017   MG 2.0 11/12/2016   Neuropathy Markers Lab Results  Component Value Date   VITAMINB12 820 11/12/2016   Bone Pathology Markers Lab Results  Component Value Date   ALKPHOS 56 02/21/2017   25OHVITD1 23 (L) 11/12/2016   25OHVITD2 <1.0 11/12/2016   25OHVITD3 23 11/12/2016   CALCIUM 9.8 02/21/2017   Coagulation Parameters Lab Results  Component Value Date   PLT 389 (H) 02/21/2017  Cardiovascular Markers Lab Results  Component Value Date   HGB 15.4 02/21/2017   HCT 44.5 02/21/2017   Note: Lab results reviewed.  Recent Diagnostic Imaging Review  Dg C-arm 1-60 Min-no Report  Result Date: 02/23/2017 Fluoroscopy was utilized by the requesting physician.  No radiographic interpretation.   Note: Imaging results reviewed.          Meds    Current Outpatient Prescriptions (Cardiovascular):  .  atenolol (TENORMIN) 50 MG tablet, Take 1 tablet (50 mg total) by mouth daily. Marland Kitchen  atorvastatin (LIPITOR) 40 MG tablet, Take 1 tablet (40 mg total) by mouth daily. .  fenofibrate 160 MG tablet, Take 1 tablet (160 mg total) by mouth daily.  Current Outpatient Prescriptions (Respiratory):  .  desloratadine-pseudoephedrine (CLARINEX-D 12-HOUR) 2.5-120 MG 12 hr tablet, Take 1 tablet by mouth as needed. .  DiphenhydrAMINE HCl (DIPHEDRYL ALLERGY PO), Take 25 mg by mouth daily.  Current Outpatient Prescriptions (Analgesics):  .  acetaminophen (TYLENOL) 500 MG tablet, Take 500 mg by mouth every 6 (six) hours as needed. Derrill Memo ON 05/04/2017] HYDROcodone-acetaminophen (NORCO/VICODIN) 5-325 MG tablet, Take 1 tablet by mouth daily as needed for moderate pain. Derrill Memo ON 06/03/2017] HYDROcodone-acetaminophen (NORCO/VICODIN) 5-325 MG tablet, Take 1 tablet by mouth daily as needed for moderate pain. Derrill Memo ON 07/03/2017] HYDROcodone-acetaminophen (NORCO/VICODIN) 5-325 MG tablet, Take 1  tablet by mouth daily as needed for moderate pain.  Current Outpatient Prescriptions (Hematological):  .  ferrous sulfate (QC FERROUS SULFATE) 325 (65 FE) MG tablet, Take 325 mg by mouth daily with breakfast. .  vitamin B-12 (CYANOCOBALAMIN) 1000 MCG tablet, Take 1,000 mcg by mouth daily.  Current Outpatient Prescriptions (Other):  Derrill Memo ON 05/14/2017] baclofen (LIORESAL) 10 MG tablet, Take 1 tablet (10 mg total) by mouth at bedtime. .  busPIRone (BUSPAR) 30 MG tablet, Take 1 tablet (30 mg total) by mouth 2 (two) times daily. .  Calcium Carbonate (CALCIUM 600 PO), Take 600 mg by mouth daily. .  Cholecalciferol (VITAMIN D3) 2000 units capsule, Take 1 capsule (2,000 Units total) by mouth daily. .  Chondroitin Sulfate 400 MG CAPS, Take by mouth 2 (two) times daily. .  DULoxetine (CYMBALTA) 30 MG capsule, Take 1 capsule (30 mg total) by mouth 2 (two) times daily. .  Glucosamine-Chondroit-Vit C-Mn (GLUCOSAMINE 1500 COMPLEX PO), Take by mouth 2 (two) times daily. .  Melatonin 3 MG CAPS, Take by mouth 3 (three) times a week. .  Methylsulfonylmethane (MSM PO), Take by mouth 2 (two) times daily. .  Multiple Vitamin (MULTIVITAMIN) tablet, Take 1 tablet by mouth daily. Marland Kitchen  omeprazole (PRILOSEC) 20 MG capsule, TAKE 1 CAPSULE (20 MG TOTAL) BY MOUTH DAILY. Marland Kitchen  ondansetron (ZOFRAN) 8 MG tablet, Take 1 tablet (8 mg total) by mouth 2 (two) times daily. .  Potassium 99 MG TABS, Take 1 tablet by mouth. .  TURMERIC PO, Take by mouth. 4 times per week .  Vitamin D, Ergocalciferol, (DRISDOL) 50000 units CAPS capsule, Take 1 capsule (50,000 Units total) by mouth 2 (two) times a week x 6 weeks. (Patient not taking: Reported on 04/28/2017)  ROS  Constitutional: Denies any fever or chills Gastrointestinal: No reported hemesis, hematochezia, vomiting, or acute GI distress Musculoskeletal: Denies any acute onset joint swelling, redness, loss of ROM, or weakness Neurological: No reported episodes of acute onset  apraxia, aphasia, dysarthria, agnosia, amnesia, paralysis, loss of coordination, or loss of consciousness  Allergies  Mr. Derwin has No Known Allergies.  [place]  Drug: Mr. Deguia  reports that he does not use drugs. Alcohol:  reports that he does not drink alcohol. Tobacco:  reports that he has never smoked. He has never used smokeless tobacco. Medical:  has a past medical history of Allergy; Anxiety; Chronic duodenal ulcer with hemorrhage (2012); Chronic neck pain; Depression; Hyperlipidemia; Hypertension; and Microscopic hematuria. Surgical: Mr. Boule  has a past surgical history that includes eye muscle repair [id number] and 1975) and bLEEDING ULCER (2012). Family: family history includes Allergies in his brother; Cancer in his maternal grandfather and mother; Dementia in his father and paternal grandfather; Depression in his father; Diabetes in his mother; Mental illness in his father; Stroke in his paternal grandfather.  Constitutional Exam  General appearance: Well nourished, well developed, and well hydrated. In no apparent acute distress Vitals:   04/28/17 0918  BP: 128/74  Pulse: 71  Resp: 16  Temp: 98 F (36.7 C)  SpO2: 99%  Weight: 184 lb (83.5 kg)  Height: '5\' 7"'  (1.702 m)   BMI Assessment: Estimated body mass index is 28.82 kg/m as calculated from the following:   Height as of this encounter: '5\' 7"'  (1.702 m).   Weight as of this encounter: 184 lb (83.5 kg).  BMI interpretation table: BMI level Category Range association with higher incidence of chronic pain  <18 kg/m2 Underweight   18.5-24.9 kg/m2 Ideal body weight   25-29.9 kg/m2 Overweight Increased incidence by 20%  30-34.9 kg/m2 Obese (Class I) Increased incidence by 68%  35-39.9 kg/m2 Severe obesity (Class II) Increased incidence by 136%  >40 kg/m2 Extreme obesity (Class III) Increased incidence by 254%   BMI Readings from Last 4 Encounters:  04/28/17 28.82 kg/m  03/23/17 28.82 kg/m  02/23/17 29.38 kg/m   02/21/17 28.56 kg/m   Wt Readings from Last 4 Encounters:  04/28/17 184 lb (83.5 kg)  03/23/17 184 lb (83.5 kg)  02/23/17 182 lb (82.6 kg)  02/21/17 180 lb 3.2 oz (81.7 kg)  Psych/Mental status: Alert, oriented x 3 (person, place, & time)       Eyes: PERLA Respiratory: No evidence of acute respiratory distress  Cervical Spine Exam  Inspection: No masses, redness, or swelling Alignment: Symmetrical Functional ROM: Unrestricted ROM      Stability: No instability detected Muscle strength & Tone: Guarding observed Sensory: Unimpaired Palpation: Complains of area being tender to palpation       for cervical facet disease  Upper Extremity (UE) Exam    Side: Right upper extremity  Side: Left upper extremity  Inspection: No masses, redness, swelling, or asymmetry. No contractures  Inspection: No masses, redness, swelling, or asymmetry. No contractures  Functional ROM: Unrestricted ROM          Functional ROM: Unrestricted ROM          Muscle strength & Tone: Functionally intact  Muscle strength & Tone: Functionally intact  Sensory: Unimpaired  Sensory: Unimpaired  Palpation: No palpable anomalies              Palpation: No palpable anomalies              Specialized Test(s): Deferred         Specialized Test(s): Deferred          Thoracic Spine Exam  Inspection: No masses, redness, or swelling Alignment: Symmetrical Functional ROM: Unrestricted ROM Stability: No instability detected Sensory: Unimpaired Muscle strength & Tone: No palpable anomalies  Lumbar Spine Exam  Inspection: No masses, redness, or swelling Alignment: Symmetrical Functional ROM:  Unrestricted ROM      Stability: No instability detected Muscle strength & Tone: Functionally intact Sensory: Unimpaired Palpation: No palpable anomalies       Provocative Tests: Lumbar Hyperextension and rotation test: evaluation deferred today       Patrick's Maneuver: evaluation deferred today                    Gait &  Posture Assessment  Ambulation: Unassisted Gait: Relatively normal for age and body habitus Posture: WNL   Lower Extremity Exam    Side: Right lower extremity  Side: Left lower extremity  Inspection: No masses, redness, swelling, or asymmetry. No contractures  Inspection: No masses, redness, swelling, or asymmetry. No contractures  Functional ROM: Unrestricted ROM          Functional ROM: Unrestricted ROM          Muscle strength & Tone: Functionally intact  Muscle strength & Tone: Functionally intact  Sensory: Unimpaired  Sensory: Unimpaired  Palpation: No palpable anomalies  Palpation: No palpable anomalies   Assessment  Primary Diagnosis & Pertinent Problem List: The primary encounter diagnosis was Cervical spondylosis. Diagnoses of Cervical facet syndrome (HCC) (L), Chronic shoulder pain (Bilateral) (L>R), Chronic low back pain (Location of Secondary source of pain) (Bilateral) (L>R), Muscle spasticity, and Chronic pain syndrome were also pertinent to this visit.  Status Diagnosis  Controlled Controlled Controlled 1. Cervical spondylosis   2. Cervical facet syndrome (HCC) (L)   3. Chronic shoulder pain (Bilateral) (L>R)   4. Chronic low back pain (Location of Secondary source of pain) (Bilateral) (L>R)   5. Muscle spasticity   6. Chronic pain syndrome     Problems updated and reviewed during this visit: Problem  Cervical facet syndrome (HCC) (L)  Cervical spondylosis  Musculoskeletal neck pain (trapezius) (Left)  Cervical DDD (C4-5, C5-6, C6-7 and C7-T1)  Cervical foraminal stenosis (Bilateral: C5-6 & C6-7, Left: C4-5 & C7-T1)   Mild bilateral neural foraminal stenosis is noted on the right at C5-6 and C6-7, and on the left at C4-5, C5-6, C6-7 and C7-T1 secondary to uncovertebral spurring   Cervical radiculitis (Bilateral) (L>R)  Muscle Spasticity  Chronic Pain Syndrome  Chronic low back pain (Location of Secondary source of pain) (Bilateral) (L>R)  Chronic upper back pain  (Location of Tertiary source of pain) (Bilateral) (L>R)  Chronic shoulder pain (Bilateral) (L>R)  Osteoarthritis of AC (acromioclavicular) joint (Right)  Chronic sacroiliac joint pain (Bilateral) (L>R)  Chronic neck pain (Location of Primary Source of Pain) (Bilateral) (L>R)   Pt seems to be doing well with present medications.  Uses Hydrocodone sparingly.  Sees a chiropractor for regular treatments.     Long Term Current Use of Opiate Analgesic  Long Term Prescription Opiate Use  Opiate use (5 MME/Day)  Vitamin D Insufficiency  Gastritis  Hyperlipidemia  Essential Hypertension, Benign   Plan of Care  Pharmacotherapy (Medications Ordered): Meds ordered this encounter  Medications  . HYDROcodone-acetaminophen (NORCO/VICODIN) 5-325 MG tablet    Sig: Take 1 tablet by mouth daily as needed for moderate pain.    Dispense:  10 tablet    Refill:  0    Fill one day early if pharmacy is closed on scheduled refill date. Do not fill until: 05/04/17 To last until: 06/03/17    Order Specific Question:   Supervising Provider    Answer:   Milinda Pointer [phone]  . HYDROcodone-acetaminophen (NORCO/VICODIN) 5-325 MG tablet    Sig: Take 1 tablet by mouth daily  as needed for moderate pain.    Dispense:  10 tablet    Refill:  0    Fill one day early if pharmacy is closed on scheduled refill date. Do not fill until: 06/03/17 To last until: 07/03/17    Order Specific Question:   Supervising Provider    Answer:   Milinda Pointer [phone]  . HYDROcodone-acetaminophen (NORCO/VICODIN) 5-325 MG tablet    Sig: Take 1 tablet by mouth daily as needed for moderate pain.    Dispense:  10 tablet    Refill:  0    Fill one day early if pharmacy is closed on scheduled refill date. Do not fill until:07/03/17 To last until: 08/02/17    Order Specific Question:   Supervising Provider    Answer:   Milinda Pointer [phone]  . DISCONTD: baclofen (LIORESAL) 10 MG tablet    Sig: Take 1 tablet (10 mg  total) by mouth at bedtime.    Dispense:  30 tablet    Refill:  0    Do not place medication on "Automatic Refill". Fill one day early if pharmacy is closed on scheduled refill date.    Order Specific Question:   Supervising Provider    Answer:   Milinda Pointer [phone]  . baclofen (LIORESAL) 10 MG tablet    Sig: Take 1 tablet (10 mg total) by mouth at bedtime.    Dispense:  90 tablet    Refill:  0    Do not place medication on "Automatic Refill". Fill one day early if pharmacy is closed on scheduled refill date.    Order Specific Question:   Supervising Provider    Answer:   Milinda Pointer [[id number]]   New Prescriptions   No medications on file   Medications administered today: Mr. Brisby had no medications administered during this visit. Lab-work, procedure(s), and/or referral(s): No orders of the defined types were placed in this encounter.  Imaging and/or referral(s): None  Interventional therapies: Planned, scheduled, and/or pending:   Diagnostic bilateral cervical facetblock pending will call once coordination with work and transportation    Considering:   Diagnostic bilateral lumbar facetblock  Possible bilateral lumbar facet RFA Diagnostic right-sided cervical epidural steroid injection  Diagnostic bilateral cervical facetblock  Possible bilateral cervical facet RFA Diagnostic bilateral sacroiliac joint block Possible bilateral sacroiliac joint RFA Diagnostic trigger point injections   Palliative PRN treatment(s):   Palliative left CESI under fluoroscopy   Provider-requested follow-up: Return in about 3 months (around 07/29/2017) for MedMgmt.  Future Appointments Date Time Provider [place]  07/28/2017 9:45 AM Vevelyn Francois, NP [facility] None  08/22/2017 10:30 AM Kathrine Haddock, NP [facility] None   Primary Care Physician: Kathrine Haddock, NP Location: [facility] Note by: Vevelyn Francois NP Date: 04/28/2017;  Time: 10:18 AM  Pain Score Disclaimer: We use the NRS-11 scale. This is a self-reported, subjective measurement of pain severity with only modest accuracy. It is used primarily to identify changes within a particular patient. It must be understood that outpatient pain scales are significantly less accurate that those used for research, where they can be applied under ideal controlled circumstances with minimal exposure to variables. In reality, the score is likely to be a combination of pain intensity and pain affect, where pain affect describes the degree of emotional arousal or changes in action readiness caused by the sensory experience of pain. Factors such as social and work situation, setting, emotional state, anxiety levels, expectation, and prior pain experience may  influence pain perception and show large inter-individual differences that may also be affected by time variables.  Patient instructions provided during this appointment: Patient Instructions    ____________________________________________________________________________________________  Medication Rules  Applies to: All patients receiving prescriptions (written or electronic).  Pharmacy of record: Pharmacy where electronic prescriptions will be sent. If written prescriptions are taken to a different pharmacy, please inform the nursing staff. The pharmacy listed in the electronic medical record should be the one where you would like electronic prescriptions to be sent.  Prescription refills: Only during scheduled appointments. Applies to both, written and electronic prescriptions.  NOTE: The following applies primarily to controlled substances (Opioid* Pain Medications).   Patient's responsibilities: 1. Pain Pills: Bring all pain pills to every appointment (except for procedure appointments). 2. Pill Bottles: Bring pills in original pharmacy bottle. Always bring newest bottle. Bring bottle, even if empty. 3. Medication  refills: You are responsible for knowing and keeping track of what medications you need refilled. The day before your appointment, write a list of all prescriptions that need to be refilled. Bring that list to your appointment and give it to the admitting nurse. Prescriptions will be written only during appointments. If you forget a medication, it will not be "Called in", "Faxed", or "electronically sent". You will need to get another appointment to get these prescribed. 4. Prescription Accuracy: You are responsible for carefully inspecting your prescriptions before leaving our office. Have the discharge nurse carefully go over each prescription with you, before taking them home. Make sure that your name is accurately spelled, that your address is correct. Check the name and dose of your medication to make sure it is accurate. Check the number of pills, and the written instructions to make sure they are clear and accurate. Make sure that you are given enough medication to last until your next medication refill appointment. 5. Taking Medication: Take medication as prescribed. Never take more pills than instructed. Never take medication more frequently than prescribed. Taking less pills or less frequently is permitted and encouraged, when it comes to controlled substances (written prescriptions).  6. Inform other Doctors: Always inform, all of your healthcare providers, of all the medications you take. 7. Pain Medication from other Providers: You are not allowed to accept any additional pain medication from any other Doctor or Healthcare provider. There are two exceptions to this rule. (see below) In the event that you require additional pain medication, you are responsible for notifying us, as stated below. 8. Medication Agreement: You are responsible for carefully reading and following our Medication Agreement. This must be signed before receiving any prescriptions from our practice. Safely store a copy of your  signed Agreement. Violations to the Agreement will result in no further prescriptions. (Additional copies of our Medication Agreement are available upon request.) 9. Laws, Rules, & Regulations: All patients are expected to follow all Federal and [employer], [employer], Rules, [employer] [facility]. Ignorance of the Laws does not constitute a valid excuse. The use of any illegal substances is prohibited. 10. Adopted CDC guidelines & recommendations: Target dosing levels will be at or below 60 MME/day. Use of benzodiazepines** is not recommended.  Exceptions: There are only two exceptions to the rule of not receiving pain medications from other Healthcare Providers. 1. Exception #1 (Emergencies): In the event of an emergency (i.e.: accident requiring emergency care), you are allowed to receive additional pain medication. However, you are responsible for: As soon as you are able, call our office [phone], at any  time of the day or night, and leave a message stating your name, the date and nature of the emergency, and the name and dose of the medication prescribed. In the event that your call is answered by a member of our staff, make sure to document and save the date, time, and the name of the person that took your information.  2. Exception #2 (Planned Surgery): In the event that you are scheduled by another doctor or dentist to have any type of surgery or procedure, you are allowed (for a period no longer than 30 days), to receive additional pain medication, for the acute post-op pain. However, in this case, you are responsible for picking up a copy of our "Post-op Pain Management for Surgeons" handout, and giving it to your surgeon or dentist. This document is available at our office, and does not require an appointment to obtain it. Simply go to our office during business hours (Monday-Thursday from 8:00 AM to 4:00 PM) (Friday 8:00 AM to 12:00 Noon) or if you have a scheduled appointment with [place], prior to your  surgery, and ask for it by name. In addition, you will need to provide [place] with your name, name of your surgeon, type of surgery, and date of procedure or surgery.  *Opioid medications include: morphine, codeine, oxycodone, oxymorphone, hydrocodone, hydromorphone, meperidine, tramadol, tapentadol, buprenorphine, fentanyl, methadone. **Benzodiazepine medications include: diazepam (Valium), alprazolam (Xanax), clonazepam (Klonopine), lorazepam (Ativan), clorazepate (Tranxene), chlordiazepoxide (Librium), estazolam (Prosom), oxazepam (Serax), temazepam (Restoril), triazolam (Halcion)  ____________________________________________________________________________________________ GENERAL RISKS AND COMPLICATIONS  What are the risk, side effects and possible complications? Generally speaking, most procedures are safe.  However, with any procedure there are risks, side effects, and the possibility of complications.  The risks and complications are dependent upon the sites that are lesioned, or the type of nerve block to be performed.  The closer the procedure is to the spine, the more serious the risks are.  Great care is taken when placing the radio frequency needles, block needles or lesioning probes, but sometimes complications can occur. 1. Infection: Any time there is an injection through the skin, there is a risk of infection.  This is why sterile conditions are used for these blocks.  There are four possible types of infection. 1. Localized skin infection. 2. Central Nervous System Infection-This can be in the form of Meningitis, which can be deadly. 3. Epidural Infections-This can be in the form of an epidural abscess, which can cause pressure inside of the spine, causing compression of the spinal cord with subsequent paralysis. This would require an emergency surgery to decompress, and there are no guarantees that the patient would recover from the paralysis. 4. Discitis-This is an infection of the  intervertebral discs.  It occurs in about 1% of discography procedures.  It is difficult to treat and it may lead to surgery.        2. Pain: the needles have to go through skin and soft tissues, will cause soreness.       3. Damage to internal structures:  The nerves to be lesioned may be near blood vessels or    other nerves which can be potentially damaged.       4. Bleeding: Bleeding is more common if the patient is taking blood thinners such as  aspirin, Coumadin, Ticiid, Plavix, etc., or if he/she have some genetic predisposition  such as hemophilia. Bleeding into the spinal canal can cause compression of the spinal  cord with subsequent paralysis.  This  would require an emergency surgery to  decompress and there are no guarantees that the patient would recover from the  paralysis.       5. Pneumothorax:  Puncturing of a lung is a possibility, every time a needle is introduced in  the area of the chest or upper back.  Pneumothorax refers to free air around the  collapsed lung(s), inside of the thoracic cavity (chest cavity).  Another two possible  complications related to a similar event would include: Hemothorax and Chylothorax.   These are variations of the Pneumothorax, where instead of air around the collapsed  lung(s), you may have blood or chyle, respectively.       6. Spinal headaches: They may occur with any procedures in the area of the spine.       7. Persistent CSF (Cerebro-Spinal Fluid) leakage: This is a rare problem, but may occur  with prolonged intrathecal or epidural catheters either due to the formation of a fistulous  track or a dural tear.       8. Nerve damage: By working so close to the spinal cord, there is always a possibility of  nerve damage, which could be as serious as a permanent spinal cord injury with  paralysis.       9. Death:  Although rare, severe deadly allergic reactions known as "Anaphylactic  reaction" can occur to any of the medications used.       10. Worsening of the symptoms:  We can always make thing worse.  What are the chances of something like this happening? Chances of any of this occuring are extremely low.  By statistics, you have more of a chance of getting killed in a motor vehicle accident: while driving to the hospital than any of the above occurring .  Nevertheless, you should be aware that they are possibilities.  In general, it is similar to taking a shower.  Everybody knows that you can slip, hit your head and get killed.  Does that mean that you should not shower again?  Nevertheless always keep in mind that statistics do not mean anything if you happen to be on the wrong side of them.  Even if a procedure has a 1 (one) in a 1,000,000 (million) chance of going wrong, it you happen to be that one..Also, keep in mind that by statistics, you have more of a chance of having something go wrong when taking medications.  Who should not have this procedure? If you are on a blood thinning medication (e.g. Coumadin, Plavix, see list of "Blood Thinners"), or if you have an active infection going on, you should not have the procedure.  If you are taking any blood thinners, please inform your physician.  How should I prepare for this procedure?  Do not eat or drink anything at least six hours prior to the procedure.  Bring a driver with you .  It cannot be a taxi.  Come accompanied by an adult that can drive you back, and that is strong enough to help you if your legs get weak or numb from the local anesthetic.  Take all of your medicines the morning of the procedure with just enough water to swallow them.  If you have diabetes, make sure that you are scheduled to have your procedure done first thing in the morning, whenever possible.  If you have diabetes, take only half of your insulin dose and notify our nurse that you have done so as soon as you  arrive at the clinic.  If you are diabetic, but only take blood sugar pills (oral  hypoglycemic), then do not take them on the morning of your procedure.  You may take them after you have had the procedure.  Do not take aspirin or any aspirin-containing medications, at least eleven (11) days prior to the procedure.  They may prolong bleeding.  Wear loose fitting clothing that may be easy to take off and that you would not mind if it got stained with Betadine or blood.  Do not wear any jewelry or perfume  Remove any nail coloring.  It will interfere with some of our monitoring equipment.  NOTE: Remember that this is not meant to be interpreted as a complete list of all possible complications.  Unforeseen problems may occur.  BLOOD THINNERS The following drugs contain aspirin or other products, which can cause increased bleeding during surgery and should not be taken for 2 weeks prior to and 1 week after surgery.  If you should need take something for relief of minor pain, you may take acetaminophen which is found in Tylenol,m Datril, Anacin-3 and Panadol. It is not blood thinner. The products listed below are.  Do not take any of the products listed below in addition to any listed on your instruction sheet.  A.P.C or A.P.C with Codeine Codeine Phosphate Capsules #3 Ibuprofen Ridaura  ABC compound Congesprin Imuran rimadil  Advil Cope Indocin Robaxisal  Alka-Seltzer Effervescent Pain Reliever and Antacid Coricidin or Coricidin-D  Indomethacin Rufen  Alka-Seltzer plus Cold Medicine Cosprin Ketoprofen S-A-C Tablets  Anacin Analgesic Tablets or Capsules Coumadin Korlgesic Salflex  Anacin Extra Strength Analgesic tablets or capsules CP-2 Tablets Lanoril Salicylate  Anaprox Cuprimine Capsules Levenox Salocol  Anexsia-D Dalteparin Magan Salsalate  Anodynos Darvon compound Magnesium Salicylate Sine-off  Ansaid Dasin Capsules Magsal Sodium Salicylate  Anturane Depen Capsules Marnal Soma  APF Arthritis pain formula Dewitt's Pills Measurin Stanback  Argesic Dia-Gesic Meclofenamic  Sulfinpyrazone  Arthritis Bayer Timed Release Aspirin Diclofenac Meclomen Sulindac  Arthritis pain formula Anacin Dicumarol Medipren Supac  Analgesic (Safety coated) Arthralgen Diffunasal Mefanamic Suprofen  Arthritis Strength Bufferin Dihydrocodeine Mepro Compound Suprol  Arthropan liquid Dopirydamole Methcarbomol with Aspirin Synalgos  ASA tablets/Enseals Disalcid Micrainin Tagament  Ascriptin Doan's Midol Talwin  Ascriptin A/D Dolene Mobidin Tanderil  Ascriptin Extra Strength Dolobid Moblgesic Ticlid  Ascriptin with Codeine Doloprin or Doloprin with Codeine Momentum Tolectin  Asperbuf Duoprin Mono-gesic Trendar  Aspergum Duradyne Motrin or Motrin IB Triminicin  Aspirin plain, buffered or enteric coated Durasal Myochrisine Trigesic  Aspirin Suppositories Easprin Nalfon Trillsate  Aspirin with Codeine Ecotrin Regular or Extra Strength Naprosyn Uracel  Atromid-S Efficin Naproxen Ursinus  Auranofin Capsules Elmiron Neocylate Vanquish  Axotal Emagrin Norgesic Verin  Azathioprine Empirin or Empirin with Codeine Normiflo Vitamin E  Azolid Emprazil Nuprin Voltaren  Bayer Aspirin plain, buffered or children's or timed BC Tablets or powders Encaprin Orgaran Warfarin Sodium  Buff-a-Comp Enoxaparin Orudis Zorpin  Buff-a-Comp with Codeine Equegesic Os-Cal-Gesic   Buffaprin Excedrin plain, buffered or Extra Strength Oxalid   Bufferin Arthritis Strength Feldene Oxphenbutazone   Bufferin plain or Extra Strength Feldene Capsules Oxycodone with Aspirin   Bufferin with Codeine Fenoprofen Fenoprofen Pabalate or Pabalate-SF   Buffets II Flogesic Panagesic   Buffinol plain or Extra Strength Florinal or Florinal with Codeine Panwarfarin   Buf-Tabs Flurbiprofen Penicillamine   Butalbital Compound Four-way cold tablets Penicillin   Butazolidin Fragmin Pepto-Bismol   Carbenicillin Geminisyn Percodan   Carna Arthritis Reliever Geopen Persantine  Carprofen Gold's salt Persistin   Chloramphenicol Goody's  Phenylbutazone   Chloromycetin Haltrain Piroxlcam   Clmetidine heparin Plaquenil   Cllnoril Hyco-pap Ponstel   Clofibrate Hydroxy chloroquine Propoxyphen         Before stopping any of these medications, be sure to consult the physician who ordered them.  Some, such as Coumadin (Warfarin) are ordered to prevent or treat serious conditions such as "deep thrombosis", "pumonary embolisms", and other heart problems.  The amount of time that you may need off of the medication may also vary with the medication and the reason for which you were taking it.  If you are taking any of these medications, please make sure you notify your pain physician before you undergo any procedures.

## 2017-07-28 ENCOUNTER — Ambulatory Visit: Payer: 59 | Attending: Nurse Practitioner | Admitting: Nurse Practitioner

## 2017-07-28 ENCOUNTER — Encounter: Payer: Self-pay | Admitting: Nurse Practitioner

## 2017-07-28 VITALS — BP 125/78 | HR 59 | Temp 98.6°F | Resp 18 | Ht 67.0 in | Wt 184.0 lb

## 2017-07-28 DIAGNOSIS — M546 Pain in thoracic spine: Secondary | ICD-10-CM | POA: Insufficient documentation

## 2017-07-28 DIAGNOSIS — M4802 Spinal stenosis, cervical region: Secondary | ICD-10-CM | POA: Diagnosis not present

## 2017-07-28 DIAGNOSIS — M545 Low back pain: Secondary | ICD-10-CM | POA: Diagnosis not present

## 2017-07-28 DIAGNOSIS — K2970 Gastritis, unspecified, without bleeding: Secondary | ICD-10-CM | POA: Diagnosis not present

## 2017-07-28 DIAGNOSIS — M25512 Pain in left shoulder: Secondary | ICD-10-CM | POA: Insufficient documentation

## 2017-07-28 DIAGNOSIS — M542 Cervicalgia: Secondary | ICD-10-CM | POA: Diagnosis present

## 2017-07-28 DIAGNOSIS — F329 Major depressive disorder, single episode, unspecified: Secondary | ICD-10-CM | POA: Insufficient documentation

## 2017-07-28 DIAGNOSIS — I10 Essential (primary) hypertension: Secondary | ICD-10-CM | POA: Insufficient documentation

## 2017-07-28 DIAGNOSIS — M25511 Pain in right shoulder: Secondary | ICD-10-CM | POA: Diagnosis not present

## 2017-07-28 DIAGNOSIS — E559 Vitamin D deficiency, unspecified: Secondary | ICD-10-CM | POA: Insufficient documentation

## 2017-07-28 DIAGNOSIS — M62838 Other muscle spasm: Secondary | ICD-10-CM

## 2017-07-28 DIAGNOSIS — F419 Anxiety disorder, unspecified: Secondary | ICD-10-CM | POA: Insufficient documentation

## 2017-07-28 DIAGNOSIS — M533 Sacrococcygeal disorders, not elsewhere classified: Secondary | ICD-10-CM | POA: Diagnosis not present

## 2017-07-28 DIAGNOSIS — E785 Hyperlipidemia, unspecified: Secondary | ICD-10-CM | POA: Insufficient documentation

## 2017-07-28 DIAGNOSIS — Z79891 Long term (current) use of opiate analgesic: Secondary | ICD-10-CM | POA: Insufficient documentation

## 2017-07-28 DIAGNOSIS — M5010 Cervical disc disorder with radiculopathy, unspecified cervical region: Secondary | ICD-10-CM | POA: Diagnosis not present

## 2017-07-28 DIAGNOSIS — G894 Chronic pain syndrome: Secondary | ICD-10-CM

## 2017-07-28 DIAGNOSIS — G8929 Other chronic pain: Secondary | ICD-10-CM | POA: Diagnosis not present

## 2017-07-28 MED ORDER — BACLOFEN 10 MG PO TABS: 10.0000 mg | ORAL_TABLET | Freq: Every day | ORAL | 0 refills | Status: DC

## 2017-07-28 MED ORDER — HYDROCODONE-ACETAMINOPHEN 5-325 MG PO TABS: 1.0000 | ORAL_TABLET | Freq: Every day | ORAL | 0 refills | Status: DC | PRN

## 2017-07-28 NOTE — Patient Instructions (Addendum)
Do not eat or drink for 3 hours prior to procedure.  Take blood pressure medication the morning of the procedure.   Epidural Steroid Injection An epidural steroid injection is given to relieve pain in your neck, back, or legs that is caused by the irritation or swelling of a nerve root. This procedure involves injecting a steroid and numbing medicine (anesthetic) into the epidural space. The epidural space is the space between the outer covering of your spinal cord and the bones that form your backbone (vertebra).  LET [facility] CARE PROVIDER KNOW ABOUT:   Any allergies you have.  All medicines you are taking, including vitamins, herbs, eye drops, creams, and over-the-counter medicines such as aspirin.  Previous problems you or members of your family have had with the use of anesthetics.  Any blood disorders or blood clotting disorders you have.  Previous surgeries you have had.  Medical conditions you have.  RISKS AND COMPLICATIONS Generally, this is a safe procedure. However, as with any procedure, complications can occur. Possible complications of epidural steroid injection include:  Headache.  Bleeding.  Infection.  Allergic reaction to the medicines.  Damage to your nerves. The response to this procedure depends on the underlying cause of the pain and its duration. People who have long-term (chronic) pain are less likely to benefit from epidural steroids than are those people whose pain comes on strong and suddenly.  BEFORE THE PROCEDURE   Ask your health care provider about changing or stopping your regular medicines. You may be advised to stop taking blood-thinning medicines a few days before the procedure.  You may be given medicines to reduce anxiety.  Arrange for someone to take you home after the procedure.  PROCEDURE   You will remain awake during the procedure. You may receive medicine to make you relaxed.  You will be asked to lie on your stomach.  The  injection site will be cleaned.  The injection site will be numbed with a medicine (local anesthetic).  A needle will be injected through your skin into the epidural space.  Your health care provider will use an X-ray machine to ensure that the steroid is delivered closest to the affected nerve. You may have minimal discomfort at this time.  Once the needle is in the right position, the local anesthetic and the steroid will be injected into the epidural space.  The needle will then be removed and a bandage will be applied to the injection site.  AFTER THE PROCEDURE   You may be monitored for a short time before you go home.  You may feel weakness or numbness in your arm or leg, which disappears within hours.  You may be allowed to eat, drink, and take your regular medicine.  You may have soreness at the site of the injection.   This information is not intended to replace advice given to you by your health care provider. Make sure you discuss any questions you have with your health care provider.   Document Released: 01/25/2008 Document Revised: 06/20/2013 Document Reviewed: 04/06/2013 Elsevier Interactive Patient Education 2016 [place]  What are the risk, side effects and possible complications? Generally speaking, most procedures are safe.  However, with any procedure there are risks, side effects, and the possibility of complications.  The risks and complications are dependent upon the sites that are lesioned, or the type of nerve block to be performed.  The closer the procedure is to the spine, the  more serious the risks are.  Great care is taken when placing the radio frequency needles, block needles or lesioning probes, but sometimes complications can occur. Infection: Any time there is an injection through the skin, there is a risk of infection.  This is why sterile conditions are used for these blocks. There are four possible types of  infection: 1. Localized skin infection. 2. Central Nervous System Infection: This can be in the form of Meningitis, which can be deadly. 3. Epidural Infections: This can be in the form of an epidural abscess, which can cause pressure inside of the spine, causing compression of the spinal cord with subsequent paralysis. This would require an emergency surgery to decompress, and there are no guarantees that the patient would recover from the paralysis. 4. Discitis: This is an infection of the intervertebral discs. It occurs in about 1% of discography procedures. It is difficult to treat and it may lead to surgery. Pain: the needles have to go through skin and soft tissues, will cause soreness. Damage to internal structures:  The nerves to be lesioned may be near blood vessels or other nerves which can be potentially damaged. Bleeding: Bleeding is more common if the patient is taking blood thinners such as  aspirin, Coumadin, Ticiid, Plavix, etc., or if he/she have some genetic predisposition such as hemophilia. Bleeding into the spinal canal can cause compression of the spinal  cord with subsequent paralysis.  This would require an emergency surgery to decompress and there are no guarantees that the patient would recover from the paralysis. Pneumothorax: Puncturing of a lung is a possibility, every time a needle is introduced in the area of the chest or upper back.  Pneumothorax refers to free air around the collapsed lung(s), inside of the thoracic cavity (chest cavity).  Another two possible complications related to a similar event would include: Hemothorax and Chylothorax. These are variations of the Pneumothorax, where instead of air around the collapsed lung(s), you may have blood or chyle, respectively. Spinal headaches: They may occur with any procedures in the area of the spine. Persistent CSF (Cerebro-Spinal Fluid) leakage: This is a rare problem, but may occur with prolonged intrathecal or epidural  catheters either due to the formation of a fistulous track or a dural tear. Nerve damage: By working so close to the spinal cord, there is always a possibility of nerve damage, which could be as serious as a permanent spinal cord injury with paralysis. Death: Although rare, severe deadly allergic reactions known as "Anaphylactic reaction" can occur to any of the medications used. Worsening of the symptoms: We can always make thing worse.  What are the chances of something like this happening? Chances of any of this occuring are extremely low.  By statistics, you have more of a chance of getting killed in a motor vehicle accident: while driving to the hospital than any of the above occurring .  Nevertheless, you should be aware that they are possibilities.  In general, it is similar to taking a shower.  Everybody knows that you can slip, hit your head and get killed.  Does that mean that you should not shower again?  Nevertheless always keep in mind that statistics do not mean anything if you happen to be on the wrong side of them.  Even if a procedure has a 1 (one) in a 1,000,000 (million) chance of going wrong, it you happen to be that one..Also, keep in mind that by statistics, you have more of a chance  of having something go wrong when taking medications.  Who should not have this procedure? If you are on a blood thinning medication (e.g. Coumadin, Plavix, see list of "Blood Thinners"), or if you have an active infection going on, you should not have the procedure.  If you are taking any blood thinners, please inform your physician.  Preparing for your procedure: 1. Do not eat or drink anything at least eight (8) hours prior to the procedure. 2. Bring a driver with you .  It cannot be a taxi. 3. Come accompanied by an adult that can drive you back, and that is strong enough to help you if your legs get weak or numb from the local anesthetic. 4. Take all of your medicines the morning of the  procedure with just enough water to swallow them. 5. If you have diabetes, make sure that you are scheduled to have your procedure done first thing in the morning, whenever possible. 6. If you have diabetes, take only half of your insulin dose and notify our nurse that you have done so as soon as you arrive at the clinic. 7. If you are diabetic, but only take blood sugar pills (oral hypoglycemic), then do not take them on the morning of your procedure.  You may take them after you have had the procedure. 8. Do not take aspirin or any aspirin-containing medications, at least eleven (11) days prior to the procedure.  They may prolong bleeding. 9. Wear loose fitting clothing that may be easy to take off and that you would not mind if it got stained with Betadine or blood. 10. Do not wear any jewelry or perfume 11. Remove any nail coloring.  It will interfere with some of our monitoring equipment. 12. If you take Metformin for your diabetes, stop it 48 hours prior to the procedure.  NOTE: Remember that this is not meant to be interpreted as a complete list of all possible complications.  Unforeseen problems may occur.  BLOOD THINNERS The following drugs contain aspirin or other products, which can cause increased bleeding during surgery and should not be taken for 2 weeks prior to and 1 week after surgery.  If you should need take something for relief of minor pain, you may take acetaminophen which is found in Tylenol,m Datril, Anacin-3 and Panadol. It is not blood thinner. The products listed below are.  Do not take any of the products listed below in addition to any listed on your instruction sheet.  A.P.C or A.P.C with Codeine Codeine Phosphate Capsules #3 Ibuprofen Ridaura  ABC compound Congesprin Imuran rimadil  Advil Cope Indocin Robaxisal  Alka-Seltzer Effervescent Pain Reliever and Antacid Coricidin or Coricidin-D  Indomethacin Rufen  Alka-Seltzer plus Cold Medicine Cosprin Ketoprofen S-A-C  Tablets  Anacin Analgesic Tablets or Capsules Coumadin Korlgesic Salflex  Anacin Extra Strength Analgesic tablets or capsules CP-2 Tablets Lanoril Salicylate  Anaprox Cuprimine Capsules Levenox Salocol  Anexsia-D Dalteparin Magan Salsalate  Anodynos Darvon compound Magnesium Salicylate Sine-off  Ansaid Dasin Capsules Magsal Sodium Salicylate  Anturane Depen Capsules Marnal Soma  APF Arthritis pain formula Dewitt's Pills Measurin Stanback  Argesic Dia-Gesic Meclofenamic Sulfinpyrazone  Arthritis Bayer Timed Release Aspirin Diclofenac Meclomen Sulindac  Arthritis pain formula Anacin Dicumarol Medipren Supac  Analgesic (Safety coated) Arthralgen Diffunasal Mefanamic Suprofen  Arthritis Strength Bufferin Dihydrocodeine Mepro Compound Suprol  Arthropan liquid Dopirydamole Methcarbomol with Aspirin Synalgos  ASA tablets/Enseals Disalcid Micrainin Tagament  Ascriptin Doan's Midol Talwin  Ascriptin A/D Dolene Mobidin Tanderil  Ascriptin Extra  Strength Dolobid Moblgesic Ticlid  Ascriptin with Codeine Doloprin or Doloprin with Codeine Momentum Tolectin  Asperbuf Duoprin Mono-gesic Trendar  Aspergum Duradyne Motrin or Motrin IB Triminicin  Aspirin plain, buffered or enteric coated Durasal Myochrisine Trigesic  Aspirin Suppositories Easprin Nalfon Trillsate  Aspirin with Codeine Ecotrin Regular or Extra Strength Naprosyn Uracel  Atromid-S Efficin Naproxen Ursinus  Auranofin Capsules Elmiron Neocylate Vanquish  Axotal Emagrin Norgesic Verin  Azathioprine Empirin or Empirin with Codeine Normiflo Vitamin E  Azolid Emprazil Nuprin Voltaren  Bayer Aspirin plain, buffered or children's or timed BC Tablets or powders Encaprin Orgaran Warfarin Sodium  Buff-a-Comp Enoxaparin Orudis Zorpin  Buff-a-Comp with Codeine Equegesic Os-Cal-Gesic   Buffaprin Excedrin plain, buffered or Extra Strength Oxalid   Bufferin Arthritis Strength Feldene Oxphenbutazone   Bufferin plain or Extra Strength Feldene Capsules  Oxycodone with Aspirin   Bufferin with Codeine Fenoprofen Fenoprofen Pabalate or Pabalate-SF   Buffets II Flogesic Panagesic   Buffinol plain or Extra Strength Florinal or Florinal with Codeine Panwarfarin   Buf-Tabs Flurbiprofen Penicillamine   Butalbital Compound Four-way cold tablets Penicillin   Butazolidin Fragmin Pepto-Bismol   Carbenicillin Geminisyn Percodan   Carna Arthritis Reliever Geopen Persantine   Carprofen Gold's salt Persistin   Chloramphenicol Goody's Phenylbutazone   Chloromycetin Haltrain Piroxlcam   Clmetidine heparin Plaquenil   Cllnoril Hyco-pap Ponstel   Clofibrate Hydroxy chloroquine Propoxyphen         Before stopping any of these medications, be sure to consult the physician who ordered them.  Some, such as Coumadin (Warfarin) are ordered to prevent or treat serious conditions such as "deep thrombosis", "pumonary embolisms", and other heart problems.  The amount of time that you may need off of the medication may also vary with the medication and the reason for which you were taking it.  If you are taking any of these medications, please make sure you notify your pain physician before you undergo any procedures. ____________________________________________________________________________________________  Medication Rules  Applies to: All patients receiving prescriptions (written or electronic).  Pharmacy of record: Pharmacy where electronic prescriptions will be sent. If written prescriptions are taken to a different pharmacy, please inform the nursing staff. The pharmacy listed in the electronic medical record should be the one where you would like electronic prescriptions to be sent.  Prescription refills: Only during scheduled appointments. Applies to both, written and electronic prescriptions.  NOTE: The following applies primarily to controlled substances (Opioid* Pain Medications).   Patient's responsibilities: 1. Pain Pills: Bring all pain pills to  every appointment (except for procedure appointments). 2. Pill Bottles: Bring pills in original pharmacy bottle. Always bring newest bottle. Bring bottle, even if empty. 3. Medication refills: You are responsible for knowing and keeping track of what medications you need refilled. The day before your appointment, write a list of all prescriptions that need to be refilled. Bring that list to your appointment and give it to the admitting nurse. Prescriptions will be written only during appointments. If you forget a medication, it will not be "Called in", "Faxed", or "electronically sent". You will need to get another appointment to get these prescribed. 4. Prescription Accuracy: You are responsible for carefully inspecting your prescriptions before leaving our office. Have the discharge nurse carefully go over each prescription with you, before taking them home. Make sure that your name is accurately spelled, that your address is correct. Check the name and dose of your medication to make sure it is accurate. Check the number of pills, and the written instructions  to make sure they are clear and accurate. Make sure that you are given enough medication to last until your next medication refill appointment. 5. Taking Medication: Take medication as prescribed. Never take more pills than instructed. Never take medication more frequently than prescribed. Taking less pills or less frequently is permitted and encouraged, when it comes to controlled substances (written prescriptions).  6. Inform other Doctors: Always inform, all of your healthcare providers, of all the medications you take. 7. Pain Medication from other Providers: You are not allowed to accept any additional pain medication from any other Doctor or Healthcare provider. There are two exceptions to this rule. (see below) In the event that you require additional pain medication, you are responsible for notifying us, as stated below. 8. Medication  Agreement: You are responsible for carefully reading and following our Medication Agreement. This must be signed before receiving any prescriptions from our practice. Safely store a copy of your signed Agreement. Violations to the Agreement will result in no further prescriptions. (Additional copies of our Medication Agreement are available upon request.) 9. Laws, Rules, & Regulations: All patients are expected to follow all Federal and [employer], [employer], Rules, [employer]. Ignorance of the Laws does not constitute a valid excuse. The use of any illegal substances is prohibited. 10. Adopted CDC guidelines & recommendations: Target dosing levels will be at or below 60 MME/day. Use of benzodiazepines** is not recommended.  Exceptions: There are only two exceptions to the rule of not receiving pain medications from other Healthcare Providers. 1. Exception #1 (Emergencies): In the event of an emergency (i.e.: accident requiring emergency care), you are allowed to receive additional pain medication. However, you are responsible for: As soon as you are able, call our office [phone], at any time of the day or night, and leave a message stating your name, the date and nature of the emergency, and the name and dose of the medication prescribed. In the event that your call is answered by a member of our staff, make sure to document and save the date, time, and the name of the person that took your information.  2. Exception #2 (Planned Surgery): In the event that you are scheduled by another doctor or dentist to have any type of surgery or procedure, you are allowed (for a period no longer than 30 days), to receive additional pain medication, for the acute post-op pain. However, in this case, you are responsible for picking up a copy of our "Post-op Pain Management for Surgeons" handout, and giving it to your surgeon or dentist. This document is available at our office, and does not require an appointment  to obtain it. Simply go to our office during business hours (Monday-Thursday from 8:00 AM to 4:00 PM) (Friday 8:00 AM to 12:00 Noon) or if you have a scheduled appointment with [place], prior to your surgery, and ask for it by name. In addition, you will need to provide [place] with your name, name of your surgeon, type of surgery, and date of procedure or surgery.  *Opioid medications include: morphine, codeine, oxycodone, oxymorphone, hydrocodone, hydromorphone, meperidine, tramadol, tapentadol, buprenorphine, fentanyl, methadone. **Benzodiazepine medications include: diazepam (Valium), alprazolam (Xanax), clonazepam (Klonopine), lorazepam (Ativan), clorazepate (Tranxene), chlordiazepoxide (Librium), estazolam (Prosom), oxazepam (Serax), temazepam (Restoril), triazolam (Halcion)  ____________________________________________________________________________________________

## 2017-07-28 NOTE — Progress Notes (Signed)
Nursing Pain Medication Assessment:  Safety precautions to be maintained throughout the outpatient stay will include: orient to surroundings, keep bed in low position, maintain call bell within reach at all times, provide assistance with transfer out of bed and ambulation.  Medication Inspection Compliance: Pill count conducted under aseptic conditions, in front of the patient. Neither the pills nor the bottle was removed from the patient's sight at any time. Once count was completed pills were immediately returned to the patient in their original bottle.  Medication: Hydrocodone/APAP Pill/Patch Count: 0 of 10 pills remain Pill/Patch Appearance: Markings consistent with prescribed medication Bottle Appearance: Standard pharmacy container. Clearly labeled. Filled Date: 09 /07 / 2018 Last Medication intake:  Jose Washington

## 2017-07-28 NOTE — Progress Notes (Signed)
Patient's Name: Jose Washington  MRN: [id number]  Referring Provider: Kathrine Haddock, NP  DOB: Mar 06, 1966  PCP: Kathrine Haddock, NP  DOS: 07/28/2017  Note by: Vevelyn Francois NP  Service setting: Ambulatory outpatient  Specialty: Interventional Pain Management  Location: [facility] (AMB) Pain Management Facility    Patient type: Established    Primary Reason(s) for Visit: Encounter for prescription drug management. (Level of risk: moderate)  CC: Back Pain (low left); Neck Pain (left); and Shoulder Pain (left)  HPI  Jose Washington is a 51 y.o. year old, male patient, who comes today for a medication management evaluation. He has Chronic neck pain (Location of Primary Source of Pain) (Bilateral) (L>R); Hyperlipidemia; Essential hypertension, benign; Gastritis; Chronic pain syndrome; Chronic low back pain (Location of Secondary source of pain) (Bilateral) (L>R); Chronic upper back pain (Location of Tertiary source of pain) (Bilateral) (L>R); Chronic shoulder pain (Bilateral) (L>R); Osteoarthritis of AC (acromioclavicular) joint (Right); Chronic sacroiliac joint pain (Bilateral) (L>R); Long term current use of opiate analgesic; Long term prescription opiate use; Opiate use (5 MME/Day); Muscle spasticity; Cervical DDD (C4-5, C5-6, C6-7 and C7-T1); Cervical foraminal stenosis (Bilateral: C5-6 & C6-7, Left: C4-5 & C7-T1); Cervical radiculitis (Bilateral) (L>R); Vitamin D insufficiency; Cervical facet syndrome (HCC) (L); Cervical spondylosis; and Musculoskeletal neck pain (trapezius) (Left) on his problem list. His primarily concern today is the Back Pain (low left); Neck Pain (left); and Shoulder Pain (left)  Pain Assessment: Location: Left, Lower Back (neck) Radiating: neck pain radiates into left shoulder with stretching, low back left radiates slightly around to hip area Onset: More than a month ago Duration: Chronic pain Quality: Aching (neck pain is achy and sharp) Severity: 0-No pain/10 (self-reported pain  score)  Note: Reported level is compatible with observation.                    Effect on ADL:   Timing: Intermittent Modifying factors: rest, relaxing, meds  Jose Washington was last scheduled for an appointment on 04/28/2017 for medication management. During today's appointment we reviewed Mr. Tabar chronic pain status, as well as his outpatient medication regimen. 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 He has some discomfort into his left knee. He does go the chiropractor every 2 weeks. He is wanting to use a TENS unit. He is wondering if a LESI would be effective. He is using the Norco about 2 per week. He would like to have this increased.   The patient  reports that he does not use drugs. His body mass index is 28.82 kg/m.  Further details on both, my assessment(s), as well as the proposed treatment plan, please see below.  Controlled Substance Pharmacotherapy Assessment REMS (Risk Evaluation and Mitigation Strategy)  Analgesic:Hydrocodone/APAP 5/325 one daily MME/day:31m/day TDewayne Shorter RN  07/28/2017 10:02 AM  Signed Nursing Pain Medication Assessment:  Safety precautions to be maintained throughout the outpatient stay will include: orient to surroundings, keep bed in low position, maintain call bell within reach at all times, provide assistance with transfer out of bed and ambulation.  Medication Inspection Compliance: Pill count conducted under aseptic conditions, in front of the patient. Neither the pills nor the bottle was removed from the patient's sight at any time. Once count was completed pills were immediately returned to the patient in their original bottle.  Medication: Hydrocodone/APAP Pill/Patch Count: 0 of 10 pills remain Pill/Patch Appearance: Markings consistent with prescribed medication Bottle Appearance: Standard pharmacy container. Clearly labeled. Filled  Date: 09 /07 / 2018 Last Medication intake:  Yesterday    Pharmacokinetics: Liberation and absorption (onset of action): WNL Distribution (time to peak effect): WNL Metabolism and excretion (duration of action): WNL         Pharmacodynamics: Desired effects: Analgesia: Jose Washington reports >50% benefit. Functional ability: Patient reports that medication allows him to accomplish basic ADLs Clinically meaningful improvement in function (CMIF): Sustained CMIF goals met Perceived effectiveness: Described as relatively effective, allowing for increase in activities of daily living (ADL) Undesirable effects: Side-effects or Adverse reactions: None reported Monitoring: [place] PMP: Online review of the past 73-monthperiod conducted. Compliant with practice rules and regulations Last UDS on record: Summary  Date Value Ref Range Status  11/03/2016 FINAL  Final    Comment:    ==================================================================== [facility] COMP DRUG ANALYSIS,UR ==================================================================== Test                             Result       Flag       Units Drug Present and Declared for Prescription Verification   Baclofen                       PRESENT      EXPECTED   Duloxetine                     PRESENT      EXPECTED   Acetaminophen                  PRESENT      EXPECTED   Atenolol                       PRESENT      EXPECTED Drug Present not Declared for Prescription Verification   Ephedrine/Pseudoephedrine      PRESENT      UNEXPECTED   Phenylpropanolamine            PRESENT      UNEXPECTED    Source of ephedrine/pseudoephedrine is most commonly    pseudoephedrine in over-the-counter or prescription cold and    allergy medications. Phenylpropanolamine is an expected    metabolite of ephedrine/pseudoephedrine. Drug Absent but Declared for Prescription Verification   Hydrocodone                    Not Detected UNEXPECTED ng/mg creat   Cyclobenzaprine                Not Detected  UNEXPECTED ==================================================================== Test                      Result    Flag   Units      Ref Range   Creatinine              232              mg/dL      >=20 ==================================================================== Declared Medications:  The flagging and interpretation on this report are based on the  following declared medications.  Unexpected results may arise from  inaccuracies in the declared medications.  **Note: The testing scope of this panel includes these medications:  Atenolol (Tenormin)  Baclofen (Lioresal)  Cyclobenzaprine (Flexeril)  Duloxetine (Cymbalta)  Hydrocodone (Norco)  **Note: The testing scope of this panel does not include small to  moderate amounts of these reported medications:  Acetaminophen (Norco)  Acetaminophen (Tylenol)  **Note: The testing scope of this panel does not include following  reported medications:  Atorvastatin (Lipitor)  Buspirone (BuSpar)  Chondroitin  Chondroitin (Glucosamine-Chondroitin)  Cyanocobalamin  Fenofibrate  Fluticasone (Flonase)  Glucosamine (Glucosamine-Chondroitin)  Iron (Ferrous Sulfate)  Melatonin  Methylsulfonylmethane  Multivitamin  Omeprazole (Prilosec)  Ondansetron (Zofran)  Turmeric ==================================================================== For clinical consultation, please call [phone]. ====================================================================    UDS interpretation: Compliant          Medication Assessment Form: Reviewed. Patient indicates being compliant with therapy Treatment compliance: Compliant Risk Assessment Profile: Aberrant behavior: See prior evaluations. None observed or detected today Comorbid factors increasing risk of overdose: See prior notes. No additional risks detected today Risk of substance use disorder (SUD): Low     Opioid Risk Tool - 07/28/17 0959      Family History of Substance Abuse    Alcohol Negative   Illegal Drugs Negative   Rx Drugs Negative     Personal History of Substance Abuse   Alcohol Negative   Illegal Drugs Negative   Rx Drugs Negative     Age   Age between 24-45 years  No     History of Preadolescent Sexual Abuse   History of Preadolescent Sexual Abuse Negative or Male     Psychological Disease   Psychological Disease Negative   Depression Positive     Total Score   Opioid Risk Tool Scoring 1   Opioid Risk Interpretation Low Risk     ORT Scoring interpretation table:  Score <3 = Low Risk for SUD  Score between 4-7 = Moderate Risk for SUD  Score >8 = High Risk for Opioid Abuse   Risk Mitigation Strategies:  Patient Counseling: Covered Patient-Prescriber Agreement (PPA): Present and active  Notification to other healthcare providers: Done  Pharmacologic Plan: No change in therapy, at this time  Laboratory Chemistry  Inflammation Markers (CRP: Acute Phase) (ESR: Chronic Phase) Lab Results  Component Value Date   CRP 1.4 (H) 11/12/2016   ESRSEDRATE 3 11/12/2016                 Renal Function Markers Lab Results  Component Value Date   BUN 14 02/21/2017   CREATININE 1.15 02/21/2017   GFRAA 85 02/21/2017   GFRNONAA 74 02/21/2017                 Hepatic Function Markers Lab Results  Component Value Date   AST 35 02/21/2017   ALT 53 (H) 02/21/2017   ALBUMIN 4.9 02/21/2017   ALKPHOS 56 02/21/2017                 Electrolytes Lab Results  Component Value Date   NA 146 (H) 02/21/2017   K 4.7 02/21/2017   CL 106 02/21/2017   CALCIUM 9.8 02/21/2017   MG 2.0 11/12/2016                 Neuropathy Markers Lab Results  Component Value Date   VITAMINB12 820 11/12/2016                 Bone Pathology Markers Lab Results  Component Value Date   ALKPHOS 56 02/21/2017   25OHVITD1 23 (L) 11/12/2016   25OHVITD2 <1.0 11/12/2016   25OHVITD3 23 11/12/2016   CALCIUM 9.8 02/21/2017                 Coagulation Parameters Lab  Results  Component Value Date   PLT 389 (H)  02/21/2017                 Cardiovascular Markers Lab Results  Component Value Date   HGB 15.4 02/21/2017   HCT 44.5 02/21/2017                 Note: Lab results reviewed.  Recent Diagnostic Imaging Results  DG C-Arm 1-60 Min-No Report Fluoroscopy was utilized by the requesting physician.  No radiographic  interpretation.   Note: Imaging results reviewed.        Meds   Current Outpatient Prescriptions:  .  acetaminophen (TYLENOL) 500 MG tablet, Take 500 mg by mouth every 6 (six) hours as needed., Disp: , Rfl:  .  atenolol (TENORMIN) 50 MG tablet, Take 1 tablet (50 mg total) by mouth daily., Disp: 90 tablet, Rfl: 1 .  atorvastatin (LIPITOR) 40 MG tablet, Take 1 tablet (40 mg total) by mouth daily., Disp: 90 tablet, Rfl: 1 .  baclofen (LIORESAL) 10 MG tablet, Take 1 tablet (10 mg total) by mouth at bedtime., Disp: 90 tablet, Rfl: 0 .  busPIRone (BUSPAR) 30 MG tablet, Take 1 tablet (30 mg total) by mouth 2 (two) times daily., Disp: 180 tablet, Rfl: 1 .  Calcium Carbonate (CALCIUM 600 PO), Take 600 mg by mouth daily., Disp: , Rfl:  .  Cholecalciferol (VITAMIN D3) 2000 units capsule, Take 1 capsule (2,000 Units total) by mouth daily., Disp: 30 capsule, Rfl: PRN .  Chondroitin Sulfate 400 MG CAPS, Take by mouth 2 (two) times daily., Disp: , Rfl:  .  desloratadine-pseudoephedrine (CLARINEX-D 12-HOUR) 2.5-120 MG 12 hr tablet, Take 1 tablet by mouth as needed., Disp: , Rfl:  .  DiphenhydrAMINE HCl (DIPHEDRYL ALLERGY PO), Take 25 mg by mouth daily., Disp: , Rfl:  .  DULoxetine (CYMBALTA) 30 MG capsule, Take 1 capsule (30 mg total) by mouth 2 (two) times daily., Disp: 180 capsule, Rfl: 1 .  fenofibrate 160 MG tablet, Take 1 tablet (160 mg total) by mouth daily., Disp: 90 tablet, Rfl: 1 .  ferrous sulfate (QC FERROUS SULFATE) 325 (65 FE) MG tablet, Take 325 mg by mouth daily with breakfast., Disp: , Rfl:  .  fluticasone (FLONASE) 50 MCG/ACT nasal  spray, Place 2 sprays into both nostrils 2 (two) times daily., Disp: , Rfl:  .  Glucosamine-Chondroit-Vit C-Mn (GLUCOSAMINE 1500 COMPLEX PO), Take by mouth 2 (two) times daily., Disp: , Rfl:  .  HYDROcodone-acetaminophen (NORCO/VICODIN) 5-325 MG tablet, Take 1 tablet by mouth daily as needed for moderate pain., Disp: 15 tablet, Rfl: 0 .  ibuprofen (ADVIL,MOTRIN) 200 MG tablet, Take 200 mg by mouth 3 (three) times a week., Disp: , Rfl:  .  Melatonin 3 MG CAPS, Take by mouth 3 (three) times a week., Disp: , Rfl:  .  Methylsulfonylmethane (MSM PO), Take by mouth 2 (two) times daily., Disp: , Rfl:  .  Multiple Vitamin (MULTIVITAMIN) tablet, Take 1 tablet by mouth daily., Disp: , Rfl:  .  omeprazole (PRILOSEC) 20 MG capsule, TAKE 1 CAPSULE (20 MG TOTAL) BY MOUTH DAILY., Disp: 90 capsule, Rfl: 1 .  ondansetron (ZOFRAN) 8 MG tablet, Take 1 tablet (8 mg total) by mouth 2 (two) times daily., Disp: 180 tablet, Rfl: 3 .  Potassium 99 MG TABS, Take 1 tablet by mouth., Disp: , Rfl:  .  TURMERIC PO, Take by mouth. 4 times per week, Disp: , Rfl:  .  vitamin B-12 (CYANOCOBALAMIN) 1000 MCG tablet, Take 1,000 mcg by mouth daily., Disp: , Rfl:  .  [  START ON 08/27/2017] HYDROcodone-acetaminophen (NORCO/VICODIN) 5-325 MG tablet, Take 1 tablet by mouth daily as needed for moderate pain., Disp: 15 tablet, Rfl: 0 .  [START ON 09/26/2017] HYDROcodone-acetaminophen (NORCO/VICODIN) 5-325 MG tablet, Take 1 tablet by mouth daily as needed for moderate pain., Disp: 15 tablet, Rfl: 0  ROS  Constitutional: Denies any fever or chills Gastrointestinal: No reported hemesis, hematochezia, vomiting, or acute GI distress Musculoskeletal: Denies any acute onset joint swelling, redness, loss of ROM, or weakness Neurological: No reported episodes of acute onset apraxia, aphasia, dysarthria, agnosia, amnesia, paralysis, loss of coordination, or loss of consciousness  Allergies  Mr. Eskew has No Known Allergies.  [place]  Drug: Mr.  Purves  reports that he does not use drugs. Alcohol:  reports that he does not drink alcohol. Tobacco:  reports that he has never smoked. He has never used smokeless tobacco. Medical:  has a past medical history of Allergy; Anxiety; Chronic duodenal ulcer with hemorrhage (2012); Chronic neck pain; Depression; Hyperlipidemia; Hypertension; and Microscopic hematuria. Surgical: Mr. Perrell  has a past surgical history that includes eye muscle repair [phone] and 1975) and bLEEDING ULCER (2012). Family: family history includes Allergies in his brother; Cancer in his maternal grandfather and mother; Dementia in his father and paternal grandfather; Depression in his father; Diabetes in his mother; Mental illness in his father; Stroke in his paternal grandfather.  Constitutional Exam  General appearance: Well nourished, well developed, and well hydrated. In no apparent acute distress Vitals:   07/28/17 0950  BP: 125/78  Pulse: (!) 59  Resp: 18  Temp: 98.6 F (37 C)  SpO2: 99%  Weight: 184 lb (83.5 kg)  Height: '5\' 7"'  (1.702 m)   Psych/Mental status: Alert, oriented x 3 (person, place, & time)       Eyes: PERLA Respiratory: No evidence of acute respiratory distress  Cervical Spine Area Exam  Skin & Axial Inspection: No masses, redness, edema, swelling, or associated skin lesions Alignment: Symmetrical Functional ROM: Unrestricted ROM      Stability: No instability detected Muscle Tone/Strength: Functionally intact. No obvious neuro-muscular anomalies detected. Sensory (Neurological): Unimpaired Palpation: No palpable anomalies              Upper Extremity (UE) Exam    Side: Right upper extremity  Side: Left upper extremity  Skin & Extremity Inspection: Skin color, temperature, and hair growth are WNL. No peripheral edema or cyanosis. No masses, redness, swelling, asymmetry, or associated skin lesions. No contractures.  Skin & Extremity Inspection: Skin color, temperature, and hair growth are  WNL. No peripheral edema or cyanosis. No masses, redness, swelling, asymmetry, or associated skin lesions. No contractures.  Functional ROM: Unrestricted ROM          Functional ROM: Unrestricted ROM          Muscle Tone/Strength: Functionally intact. No obvious neuro-muscular anomalies detected.  Muscle Tone/Strength: Functionally intact. No obvious neuro-muscular anomalies detected.  Sensory (Neurological): Unimpaired          Sensory (Neurological): Unimpaired          Palpation: No palpable anomalies              Palpation: No palpable anomalies              Specialized Test(s): Deferred         Specialized Test(s): Deferred          Thoracic Spine Area Exam  Skin & Axial Inspection: No masses, redness, or swelling Alignment: Symmetrical Functional ROM:  Unrestricted ROM Stability: No instability detected Muscle Tone/Strength: Functionally intact. No obvious neuro-muscular anomalies detected. Sensory (Neurological): Unimpaired Muscle strength & Tone: No palpable anomalies  Lumbar Spine Area Exam  Skin & Axial Inspection: No masses, redness, or swelling Alignment: Symmetrical Functional ROM: Unrestricted ROM      Stability: No instability detected Muscle Tone/Strength: Functionally intact. No obvious neuro-muscular anomalies detected. Sensory (Neurological): Unimpaired Palpation: Complains of area being tender to palpation       Provocative Tests: Lumbar Hyperextension and rotation test: Positive on the left for facet joint pain. Lumbar Lateral bending test: Positive       Patrick's Maneuver: evaluation deferred today                    Gait & Posture Assessment  Ambulation: Unassisted Gait: Relatively normal for age and body habitus Posture: WNL   Lower Extremity Exam    Side: Right lower extremity  Side: Left lower extremity  Skin & Extremity Inspection: Skin color, temperature, and hair growth are WNL. No peripheral edema or cyanosis. No masses, redness, swelling, asymmetry,  or associated skin lesions. No contractures.  Skin & Extremity Inspection: Skin color, temperature, and hair growth are WNL. No peripheral edema or cyanosis. No masses, redness, swelling, asymmetry, or associated skin lesions. No contractures.  Functional ROM: Unrestricted ROM          Functional ROM: Unrestricted ROM          Muscle Tone/Strength: Functionally intact. No obvious neuro-muscular anomalies detected.  Muscle Tone/Strength: Functionally intact. No obvious neuro-muscular anomalies detected.  Sensory (Neurological): Unimpaired  Sensory (Neurological): Unimpaired  Palpation: No palpable anomalies  Palpation: No palpable anomalies   Assessment  Primary Diagnosis & Pertinent Problem List: The primary encounter diagnosis was Chronic low back pain (Location of Secondary source of pain) (Bilateral) (L>R). Diagnoses of Chronic neck pain (Location of Primary Source of Pain) (Bilateral) (L>R), Chronic pain syndrome, Muscle spasticity, Musculoskeletal neck pain (trapezius) (Left), and Long term current use of opiate analgesic were also pertinent to this visit.  Status Diagnosis  Controlled Controlled Controlled 1. Chronic low back pain (Location of Secondary source of pain) (Bilateral) (L>R)   2. Chronic neck pain (Location of Primary Source of Pain) (Bilateral) (L>R)   3. Chronic pain syndrome   4. Muscle spasticity   5. Musculoskeletal neck pain (trapezius) (Left)   6. Long term current use of opiate analgesic     Problems updated and reviewed during this visit: No problems updated. Plan of Care  Pharmacotherapy (Medications Ordered): Meds ordered this encounter  Medications  . baclofen (LIORESAL) 10 MG tablet    Sig: Take 1 tablet (10 mg total) by mouth at bedtime.    Dispense:  90 tablet    Refill:  0    Do not place medication on "Automatic Refill". Fill one day early if pharmacy is closed on scheduled refill date.    Order Specific Question:   Supervising Provider    Answer:    Milinda Pointer [phone]  . HYDROcodone-acetaminophen (NORCO/VICODIN) 5-325 MG tablet    Sig: Take 1 tablet by mouth daily as needed for moderate pain.    Dispense:  15 tablet    Refill:  0    Fill one day early if pharmacy is closed on scheduled refill date. Do not fill until9/27/2018 To last until: 08/27/2017    Order Specific Question:   Supervising Provider    Answer:   Milinda Pointer [phone]  . HYDROcodone-acetaminophen (NORCO/VICODIN) 5-325  MG tablet    Sig: Take 1 tablet by mouth daily as needed for moderate pain.    Dispense:  15 tablet    Refill:  0    Fill one day early if pharmacy is closed on scheduled refill date. Do not fill until: 08/27/2017 To last until: 09/26/2017    Order Specific Question:   Supervising Provider    Answer:   Milinda Pointer [phone]  . HYDROcodone-acetaminophen (NORCO/VICODIN) 5-325 MG tablet    Sig: Take 1 tablet by mouth daily as needed for moderate pain.    Dispense:  15 tablet    Refill:  0    Fill one day early if pharmacy is closed on scheduled refill date. Do not fill until:09/26/2017 To last until:10/26/2017    Order Specific Question:   Supervising Provider    Answer:   Milinda Pointer [[id number]]   New Prescriptions   No medications on file   Medications administered today: Mr. Matulich had no medications administered during this visit. Lab-work, procedure(s), and/or referral(s): Orders Placed This Encounter  Procedures  . Lumbar Epidural Injection  . Tens unit  . ToxASSURE Select 13 (MW), Urine  . Ambulatory referral to Physical Therapy   Imaging and/or referral(s): TENS UNIT AMB REFERRAL TO PHYSICAL THERAPY  Interventional therapies: Planned, scheduled, and/or pending:   Diagnostic Left lumbar ESI without sedation    Considering:   Diagnostic bilateral lumbar facetblock  Possible bilateral lumbar facet RFA Diagnostic right-sided cervical epidural steroid injection  Diagnostic bilateral cervical facetblock   Possible bilateral cervical facet RFA Diagnostic bilateral sacroiliac joint block Possible bilateral sacroiliac joint RFA Diagnostic trigger point injections   Palliative PRN treatment(s):   Palliative left CESI under fluoroscopy   Provider-requested follow-up: Return for w/ Dr. Dossie Arbour, Procedure(NS).  Future Appointments Date Time Provider [place]  08/22/2017 10:30 AM Kathrine Haddock, NP [facility] None  10/19/2017 10:45 AM [facility] Francois, NP [facility] None   Primary Care Physician: Kathrine Haddock, NP Location: [facility] Note by: Vevelyn Francois NP Date: 07/28/2017; Time: 1:59 PM  Pain Score Disclaimer: We use the NRS-11 scale. This is a self-reported, subjective measurement of pain severity with only modest accuracy. It is used primarily to identify changes within a particular patient. It must be understood that outpatient pain scales are significantly less accurate that those used for research, where they can be applied under ideal controlled circumstances with minimal exposure to variables. In reality, the score is likely to be a combination of pain intensity and pain affect, where pain affect describes the degree of emotional arousal or changes in action readiness caused by the sensory experience of pain. Factors such as social and work situation, setting, emotional state, anxiety levels, expectation, and prior pain experience may influence pain perception and show large inter-individual differences that may also be affected by time variables.  Patient instructions provided during this appointment: Patient Instructions   Do not eat or drink for 3 hours prior to procedure.  Take blood pressure medication the morning of the procedure.   Epidural Steroid Injection An epidural steroid injection is given to relieve pain in your neck, back, or legs that is caused by the irritation or swelling of a nerve root. This procedure involves injecting a  steroid and numbing medicine (anesthetic) into the epidural space. The epidural space is the space between the outer covering of your spinal cord and the bones that form your backbone (vertebra).  LET [facility] CARE PROVIDER KNOW ABOUT:  Any allergies you have.  All medicines you are taking, including vitamins, herbs, eye drops, creams, and over-the-counter medicines such as aspirin.  Previous problems you or members of your family have had with the use of anesthetics.  Any blood disorders or blood clotting disorders you have.  Previous surgeries you have had.  Medical conditions you have.  RISKS AND COMPLICATIONS Generally, this is a safe procedure. However, as with any procedure, complications can occur. Possible complications of epidural steroid injection include:  Headache.  Bleeding.  Infection.  Allergic reaction to the medicines.  Damage to your nerves. The response to this procedure depends on the underlying cause of the pain and its duration. People who have long-term (chronic) pain are less likely to benefit from epidural steroids than are those people whose pain comes on strong and suddenly.  BEFORE THE PROCEDURE   Ask your health care provider about changing or stopping your regular medicines. You may be advised to stop taking blood-thinning medicines a few days before the procedure.  You may be given medicines to reduce anxiety.  Arrange for someone to take you home after the procedure.  PROCEDURE   You will remain awake during the procedure. You may receive medicine to make you relaxed.  You will be asked to lie on your stomach.  The injection site will be cleaned.  The injection site will be numbed with a medicine (local anesthetic).  A needle will be injected through your skin into the epidural space.  Your health care provider will use an X-ray machine to ensure that the steroid is delivered closest to the affected nerve. You may have minimal  discomfort at this time.  Once the needle is in the right position, the local anesthetic and the steroid will be injected into the epidural space.  The needle will then be removed and a bandage will be applied to the injection site.  AFTER THE PROCEDURE   You may be monitored for a short time before you go home.  You may feel weakness or numbness in your arm or leg, which disappears within hours.  You may be allowed to eat, drink, and take your regular medicine.  You may have soreness at the site of the injection.   This information is not intended to replace advice given to you by your health care provider. Make sure you discuss any questions you have with your health care provider.   Document Released: 01/25/2008 Document Revised: 06/20/2013 Document Reviewed: 04/06/2013 Elsevier Interactive Patient Education 2016 [place]  What are the risk, side effects and possible complications? Generally speaking, most procedures are safe.  However, with any procedure there are risks, side effects, and the possibility of complications.  The risks and complications are dependent upon the sites that are lesioned, or the type of nerve block to be performed.  The closer the procedure is to the spine, the more serious the risks are.  Great care is taken when placing the radio frequency needles, block needles or lesioning probes, but sometimes complications can occur. Infection: Any time there is an injection through the skin, there is a risk of infection.  This is why sterile conditions are used for these blocks. There are four possible types of infection: 1. Localized skin infection. 2. Central Nervous System Infection: This can be in the form of Meningitis, which can be deadly. 3. Epidural Infections: This can be in the form of an epidural abscess, which can cause pressure inside  of the spine, causing compression of the spinal cord with subsequent paralysis. This  would require an emergency surgery to decompress, and there are no guarantees that the patient would recover from the paralysis. 4. Discitis: This is an infection of the intervertebral discs. It occurs in about 1% of discography procedures. It is difficult to treat and it may lead to surgery. Pain: the needles have to go through skin and soft tissues, will cause soreness. Damage to internal structures:  The nerves to be lesioned may be near blood vessels or other nerves which can be potentially damaged. Bleeding: Bleeding is more common if the patient is taking blood thinners such as  aspirin, Coumadin, Ticiid, Plavix, etc., or if he/she have some genetic predisposition such as hemophilia. Bleeding into the spinal canal can cause compression of the spinal  cord with subsequent paralysis.  This would require an emergency surgery to decompress and there are no guarantees that the patient would recover from the paralysis. Pneumothorax: Puncturing of a lung is a possibility, every time a needle is introduced in the area of the chest or upper back.  Pneumothorax refers to free air around the collapsed lung(s), inside of the thoracic cavity (chest cavity).  Another two possible complications related to a similar event would include: Hemothorax and Chylothorax. These are variations of the Pneumothorax, where instead of air around the collapsed lung(s), you may have blood or chyle, respectively. Spinal headaches: They may occur with any procedures in the area of the spine. Persistent CSF (Cerebro-Spinal Fluid) leakage: This is a rare problem, but may occur with prolonged intrathecal or epidural catheters either due to the formation of a fistulous track or a dural tear. Nerve damage: By working so close to the spinal cord, there is always a possibility of nerve damage, which could be as serious as a permanent spinal cord injury with paralysis. Death: Although rare, severe deadly allergic reactions known as  "Anaphylactic reaction" can occur to any of the medications used. Worsening of the symptoms: We can always make thing worse.  What are the chances of something like this happening? Chances of any of this occuring are extremely low.  By statistics, you have more of a chance of getting killed in a motor vehicle accident: while driving to the hospital than any of the above occurring .  Nevertheless, you should be aware that they are possibilities.  In general, it is similar to taking a shower.  Everybody knows that you can slip, hit your head and get killed.  Does that mean that you should not shower again?  Nevertheless always keep in mind that statistics do not mean anything if you happen to be on the wrong side of them.  Even if a procedure has a 1 (one) in a 1,000,000 (million) chance of going wrong, it you happen to be that one..Also, keep in mind that by statistics, you have more of a chance of having something go wrong when taking medications.  Who should not have this procedure? If you are on a blood thinning medication (e.g. Coumadin, Plavix, see list of "Blood Thinners"), or if you have an active infection going on, you should not have the procedure.  If you are taking any blood thinners, please inform your physician.  Preparing for your procedure: 1. Do not eat or drink anything at least eight (8) hours prior to the procedure. 2. Bring a driver with you .  It cannot be a taxi. 3. Come accompanied by an adult that  can drive you back, and that is strong enough to help you if your legs get weak or numb from the local anesthetic. 4. Take all of your medicines the morning of the procedure with just enough water to swallow them. 5. If you have diabetes, make sure that you are scheduled to have your procedure done first thing in the morning, whenever possible. 6. If you have diabetes, take only half of your insulin dose and notify our nurse that you have done so as soon as you arrive at the  clinic. 7. If you are diabetic, but only take blood sugar pills (oral hypoglycemic), then do not take them on the morning of your procedure.  You may take them after you have had the procedure. 8. Do not take aspirin or any aspirin-containing medications, at least eleven (11) days prior to the procedure.  They may prolong bleeding. 9. Wear loose fitting clothing that may be easy to take off and that you would not mind if it got stained with Betadine or blood. 10. Do not wear any jewelry or perfume 11. Remove any nail coloring.  It will interfere with some of our monitoring equipment. 12. If you take Metformin for your diabetes, stop it 48 hours prior to the procedure.  NOTE: Remember that this is not meant to be interpreted as a complete list of all possible complications.  Unforeseen problems may occur.  BLOOD THINNERS The following drugs contain aspirin or other products, which can cause increased bleeding during surgery and should not be taken for 2 weeks prior to and 1 week after surgery.  If you should need take something for relief of minor pain, you may take acetaminophen which is found in Tylenol,m Datril, Anacin-3 and Panadol. It is not blood thinner. The products listed below are.  Do not take any of the products listed below in addition to any listed on your instruction sheet.  A.P.C or A.P.C with Codeine Codeine Phosphate Capsules #3 Ibuprofen Ridaura  ABC compound Congesprin Imuran rimadil  Advil Cope Indocin Robaxisal  Alka-Seltzer Effervescent Pain Reliever and Antacid Coricidin or Coricidin-D  Indomethacin Rufen  Alka-Seltzer plus Cold Medicine Cosprin Ketoprofen S-A-C Tablets  Anacin Analgesic Tablets or Capsules Coumadin Korlgesic Salflex  Anacin Extra Strength Analgesic tablets or capsules CP-2 Tablets Lanoril Salicylate  Anaprox Cuprimine Capsules Levenox Salocol  Anexsia-D Dalteparin Magan Salsalate  Anodynos Darvon compound Magnesium Salicylate Sine-off  Ansaid Dasin  Capsules Magsal Sodium Salicylate  Anturane Depen Capsules Marnal Soma  APF Arthritis pain formula Dewitt's Pills Measurin Stanback  Argesic Dia-Gesic Meclofenamic Sulfinpyrazone  Arthritis Bayer Timed Release Aspirin Diclofenac Meclomen Sulindac  Arthritis pain formula Anacin Dicumarol Medipren Supac  Analgesic (Safety coated) Arthralgen Diffunasal Mefanamic Suprofen  Arthritis Strength Bufferin Dihydrocodeine Mepro Compound Suprol  Arthropan liquid Dopirydamole Methcarbomol with Aspirin Synalgos  ASA tablets/Enseals Disalcid Micrainin Tagament  Ascriptin Doan's Midol Talwin  Ascriptin A/D Dolene Mobidin Tanderil  Ascriptin Extra Strength Dolobid Moblgesic Ticlid  Ascriptin with Codeine Doloprin or Doloprin with Codeine Momentum Tolectin  Asperbuf Duoprin Mono-gesic Trendar  Aspergum Duradyne Motrin or Motrin IB Triminicin  Aspirin plain, buffered or enteric coated Durasal Myochrisine Trigesic  Aspirin Suppositories Easprin Nalfon Trillsate  Aspirin with Codeine Ecotrin Regular or Extra Strength Naprosyn Uracel  Atromid-S Efficin Naproxen Ursinus  Auranofin Capsules Elmiron Neocylate Vanquish  Axotal Emagrin Norgesic Verin  Azathioprine Empirin or Empirin with Codeine Normiflo Vitamin E  Azolid Emprazil Nuprin Voltaren  Bayer Aspirin plain, buffered or children's or timed BC Tablets or powders  Encaprin Orgaran Warfarin Sodium  Buff-a-Comp Enoxaparin Orudis Zorpin  Buff-a-Comp with Codeine Equegesic Os-Cal-Gesic   Buffaprin Excedrin plain, buffered or Extra Strength Oxalid   Bufferin Arthritis Strength Feldene Oxphenbutazone   Bufferin plain or Extra Strength Feldene Capsules Oxycodone with Aspirin   Bufferin with Codeine Fenoprofen Fenoprofen Pabalate or Pabalate-SF   Buffets II Flogesic Panagesic   Buffinol plain or Extra Strength Florinal or Florinal with Codeine Panwarfarin   Buf-Tabs Flurbiprofen Penicillamine   Butalbital Compound Four-way cold tablets Penicillin    Butazolidin Fragmin Pepto-Bismol   Carbenicillin Geminisyn Percodan   Carna Arthritis Reliever Geopen Persantine   Carprofen Gold's salt Persistin   Chloramphenicol Goody's Phenylbutazone   Chloromycetin Haltrain Piroxlcam   Clmetidine heparin Plaquenil   Cllnoril Hyco-pap Ponstel   Clofibrate Hydroxy chloroquine Propoxyphen         Before stopping any of these medications, be sure to consult the physician who ordered them.  Some, such as Coumadin (Warfarin) are ordered to prevent or treat serious conditions such as "deep thrombosis", "pumonary embolisms", and other heart problems.  The amount of time that you may need off of the medication may also vary with the medication and the reason for which you were taking it.  If you are taking any of these medications, please make sure you notify your pain physician before you undergo any procedures. ____________________________________________________________________________________________  Medication Rules  Applies to: All patients receiving prescriptions (written or electronic).  Pharmacy of record: Pharmacy where electronic prescriptions will be sent. If written prescriptions are taken to a different pharmacy, please inform the nursing staff. The pharmacy listed in the electronic medical record should be the one where you would like electronic prescriptions to be sent.  Prescription refills: Only during scheduled appointments. Applies to both, written and electronic prescriptions.  NOTE: The following applies primarily to controlled substances (Opioid* Pain Medications).   Patient's responsibilities: 1. Pain Pills: Bring all pain pills to every appointment (except for procedure appointments). 2. Pill Bottles: Bring pills in original pharmacy bottle. Always bring newest bottle. Bring bottle, even if empty. 3. Medication refills: You are responsible for knowing and keeping track of what medications you need refilled. The day before your  appointment, write a list of all prescriptions that need to be refilled. Bring that list to your appointment and give it to the admitting nurse. Prescriptions will be written only during appointments. If you forget a medication, it will not be "Called in", "Faxed", or "electronically sent". You will need to get another appointment to get these prescribed. 4. Prescription Accuracy: You are responsible for carefully inspecting your prescriptions before leaving our office. Have the discharge nurse carefully go over each prescription with you, before taking them home. Make sure that your name is accurately spelled, that your address is correct. Check the name and dose of your medication to make sure it is accurate. Check the number of pills, and the written instructions to make sure they are clear and accurate. Make sure that you are given enough medication to last until your next medication refill appointment. 5. Taking Medication: Take medication as prescribed. Never take more pills than instructed. Never take medication more frequently than prescribed. Taking less pills or less frequently is permitted and encouraged, when it comes to controlled substances (written prescriptions).  6. Inform other Doctors: Always inform, all of your healthcare providers, of all the medications you take. 7. Pain Medication from other Providers: You are not allowed to accept any additional pain medication from any other  Doctor or Healthcare provider. There are two exceptions to this rule. (see below) In the event that you require additional pain medication, you are responsible for notifying us, as stated below. 8. Medication Agreement: You are responsible for carefully reading and following our Medication Agreement. This must be signed before receiving any prescriptions from our practice. Safely store a copy of your signed Agreement. Violations to the Agreement will result in no further prescriptions. (Additional copies of our  Medication Agreement are available upon request.) 9. Laws, Rules, & Regulations: All patients are expected to follow all Federal and [employer], [employer], Rules, [employer]. Ignorance of the Laws does not constitute a valid excuse. The use of any illegal substances is prohibited. 10. Adopted CDC guidelines & recommendations: Target dosing levels will be at or below 60 MME/day. Use of benzodiazepines** is not recommended.  Exceptions: There are only two exceptions to the rule of not receiving pain medications from other Healthcare Providers. 1. Exception #1 (Emergencies): In the event of an emergency (i.e.: accident requiring emergency care), you are allowed to receive additional pain medication. However, you are responsible for: As soon as you are able, call our office [phone], at any time of the day or night, and leave a message stating your name, the date and nature of the emergency, and the name and dose of the medication prescribed. In the event that your call is answered by a member of our staff, make sure to document and save the date, time, and the name of the person that took your information.  2. Exception #2 (Planned Surgery): In the event that you are scheduled by another doctor or dentist to have any type of surgery or procedure, you are allowed (for a period no longer than 30 days), to receive additional pain medication, for the acute post-op pain. However, in this case, you are responsible for picking up a copy of our "Post-op Pain Management for Surgeons" handout, and giving it to your surgeon or dentist. This document is available at our office, and does not require an appointment to obtain it. Simply go to our office during business hours (Monday-Thursday from 8:00 AM to 4:00 PM) (Friday 8:00 AM to 12:00 Noon) or if you have a scheduled appointment with [place], prior to your surgery, and ask for it by name. In addition, you will need to provide [place] with your name, name of your surgeon,  type of surgery, and date of procedure or surgery.  *Opioid medications include: morphine, codeine, oxycodone, oxymorphone, hydrocodone, hydromorphone, meperidine, tramadol, tapentadol, buprenorphine, fentanyl, methadone. **Benzodiazepine medications include: diazepam (Valium), alprazolam (Xanax), clonazepam (Klonopine), lorazepam (Ativan), clorazepate (Tranxene), chlordiazepoxide (Librium), estazolam (Prosom), oxazepam (Serax), temazepam (Restoril), triazolam (Halcion)  ____________________________________________________________________________________________

## 2017-08-03 LAB — TOXASSURE SELECT 13 (MW), URINE

## 2017-08-16 ENCOUNTER — Ambulatory Visit: Payer: 59 | Admitting: Pain Medicine

## 2017-08-22 ENCOUNTER — Ambulatory Visit (INDEPENDENT_AMBULATORY_CARE_PROVIDER_SITE_OTHER): Payer: 59 | Admitting: Unknown Physician Specialty

## 2017-08-22 ENCOUNTER — Encounter: Payer: Self-pay | Admitting: Unknown Physician Specialty

## 2017-08-22 DIAGNOSIS — F325 Major depressive disorder, single episode, in full remission: Secondary | ICD-10-CM | POA: Insufficient documentation

## 2017-08-22 DIAGNOSIS — E559 Vitamin D deficiency, unspecified: Secondary | ICD-10-CM

## 2017-08-22 DIAGNOSIS — E785 Hyperlipidemia, unspecified: Secondary | ICD-10-CM

## 2017-08-22 DIAGNOSIS — I10 Essential (primary) hypertension: Secondary | ICD-10-CM | POA: Diagnosis not present

## 2017-08-22 DIAGNOSIS — F322 Major depressive disorder, single episode, severe without psychotic features: Secondary | ICD-10-CM

## 2017-08-22 MED ORDER — DULOXETINE HCL 30 MG PO CPEP: 30.0000 mg | ORAL_CAPSULE | Freq: Two times a day (BID) | ORAL | 1 refills | Status: DC

## 2017-08-22 MED ORDER — BUSPIRONE HCL 30 MG PO TABS: 30.0000 mg | ORAL_TABLET | Freq: Two times a day (BID) | ORAL | 1 refills | Status: DC

## 2017-08-22 MED ORDER — ONDANSETRON HCL 8 MG PO TABS: 8.0000 mg | ORAL_TABLET | Freq: Two times a day (BID) | ORAL | 3 refills | Status: DC

## 2017-08-22 MED ORDER — FENOFIBRATE 160 MG PO TABS: 160.0000 mg | ORAL_TABLET | Freq: Every day | ORAL | 1 refills | Status: DC

## 2017-08-22 MED ORDER — ATENOLOL 50 MG PO TABS: 50.0000 mg | ORAL_TABLET | Freq: Every day | ORAL | 1 refills | Status: DC

## 2017-08-22 MED ORDER — ATORVASTATIN CALCIUM 40 MG PO TABS: 40.0000 mg | ORAL_TABLET | Freq: Every day | ORAL | 1 refills | Status: DC

## 2017-08-22 NOTE — Progress Notes (Signed)
BP 103/63   Pulse 62   Temp 98.2 F (36.8 C)   Ht 5' 6.3" (1.684 m)   Wt 180 lb 6.4 oz (81.8 kg)   SpO2 98%   BMI 28.85 kg/m    Subjective:    Patient ID: Jose Washington, male    DOB: 1966/04/06, 51 y.o.   MRN: [id number]  HPI: Jose Washington is a 51 y.o. male  Chief Complaint  Patient presents with  . Depression  . Hyperlipidemia  . Hypertension   Hypertension Using medications without difficulty Average home BPs Not checking   No problems or lightheadedness No chest pain with exertion or shortness of breath No Edema  Hyperlipidemia Using medications without problems: No Muscle aches  Diet compliance:Exercise: no changes  Depression Doing well with Buspar and Cymbalta Depression screen [facility] 2/9 08/22/2017 07/28/2017 04/28/2017 03/23/2017 02/23/2017  Decreased Interest 0 0 0 0 0  Down, Depressed, Hopeless 0 0 0 0 0  PHQ - 2 Score 0 0 0 0 0  Altered sleeping 1 - - - -  Tired, decreased energy 0 - - - -  Change in appetite 0 - - - -  Feeling bad or failure about yourself  0 - - - -  Trouble concentrating 0 - - - -  Moving slowly or fidgety/restless 0 - - - -  Suicidal thoughts 0 - - - -  PHQ-9 Score 1 - - - -   Low back pain Going to the pain clinic for injections, medications, stretching and PT  Relevant past medical, surgical, family and social history reviewed and updated as indicated. Interim medical history since our last visit reviewed. Allergies and medications reviewed and updated.  Review of Systems  Per HPI unless specifically indicated above     Objective:    BP 103/63   Pulse 62   Temp 98.2 F (36.8 C)   Ht 5' 6.3" (1.684 m)   Wt 180 lb 6.4 oz (81.8 kg)   SpO2 98%   BMI 28.85 kg/m   Wt Readings from Last 3 Encounters:  08/22/17 180 lb 6.4 oz (81.8 kg)  07/28/17 184 lb (83.5 kg)  04/28/17 184 lb (83.5 kg)    Physical Exam  Constitutional: He is oriented to person, place, and time. He appears well-developed and well-nourished.  No distress.  HENT:  Head: Normocephalic and atraumatic.  Eyes: Conjunctivae and lids are normal. Right eye exhibits no discharge. Left eye exhibits no discharge. No scleral icterus.  Neck: Normal range of motion. Neck supple. No JVD present. Carotid bruit is not present.  Cardiovascular: Normal rate, regular rhythm and normal heart sounds.   Pulmonary/Chest: Effort normal and breath sounds normal. No respiratory distress.  Abdominal: Normal appearance. There is no splenomegaly or hepatomegaly.  Musculoskeletal: Normal range of motion.  Neurological: He is alert and oriented to person, place, and time.  Skin: Skin is warm, dry and intact. No rash noted. No pallor.  Psychiatric: He has a normal mood and affect. His behavior is normal. Judgment and thought content normal.    Results for orders placed or performed in visit on 07/28/17  ToxASSURE Select 13 (MW), Urine  Result Value Ref Range   Summary FINAL       Assessment & Plan:   Problem List Items Addressed This Visit      Unprioritized   Depression, major, single episode, severe ([place])    In remission on current treatment      Relevant Medications  busPIRone (BUSPAR) 30 MG tablet   DULoxetine (CYMBALTA) 30 MG capsule   Essential hypertension, benign    Stable, continue present medications.        Relevant Medications   atenolol (TENORMIN) 50 MG tablet   atorvastatin (LIPITOR) 40 MG tablet   fenofibrate 160 MG tablet   Hyperlipidemia    Stable, continue present medications.        Relevant Medications   atenolol (TENORMIN) 50 MG tablet   atorvastatin (LIPITOR) 40 MG tablet   fenofibrate 160 MG tablet   Vitamin D insufficiency    Per pain clinic          Follow up plan: Return in about 6 months (around 02/20/2018) for physical.

## 2017-08-22 NOTE — Assessment & Plan Note (Signed)
Stable, continue present medications.   

## 2017-08-22 NOTE — Assessment & Plan Note (Signed)
In remission on current treatment

## 2017-08-22 NOTE — Assessment & Plan Note (Signed)
Per pain clinic 

## 2017-08-25 ENCOUNTER — Encounter: Payer: Self-pay | Admitting: Pain Medicine

## 2017-08-25 ENCOUNTER — Ambulatory Visit (HOSPITAL_BASED_OUTPATIENT_CLINIC_OR_DEPARTMENT_OTHER): Payer: 59 | Admitting: Pain Medicine

## 2017-08-25 ENCOUNTER — Ambulatory Visit
Admission: RE | Admit: 2017-08-25 | Discharge: 2017-08-25 | Disposition: A | Discharge status: Home / Self Care | Payer: 59 | Source: Ambulatory Visit | Attending: Pain Medicine | Admitting: Pain Medicine

## 2017-08-25 VITALS — BP 138/68 | HR 67 | Temp 98.2°F | Resp 18 | Ht 67.0 in | Wt 180.0 lb

## 2017-08-25 DIAGNOSIS — M79605 Pain in left leg: Secondary | ICD-10-CM | POA: Diagnosis not present

## 2017-08-25 DIAGNOSIS — M5136 Other intervertebral disc degeneration, lumbar region: Secondary | ICD-10-CM | POA: Insufficient documentation

## 2017-08-25 DIAGNOSIS — M47816 Spondylosis without myelopathy or radiculopathy, lumbar region: Secondary | ICD-10-CM | POA: Insufficient documentation

## 2017-08-25 DIAGNOSIS — M545 Low back pain: Secondary | ICD-10-CM

## 2017-08-25 DIAGNOSIS — M25562 Pain in left knee: Secondary | ICD-10-CM | POA: Diagnosis present

## 2017-08-25 DIAGNOSIS — M4310 Spondylolisthesis, site unspecified: Secondary | ICD-10-CM | POA: Insufficient documentation

## 2017-08-25 DIAGNOSIS — G8929 Other chronic pain: Secondary | ICD-10-CM | POA: Insufficient documentation

## 2017-08-25 MED ORDER — LIDOCAINE HCL 2 % IJ SOLN
10.0000 mL | Freq: Once | INTRAMUSCULAR | Status: AC
  Administered 2017-08-25: 400 mg
  Filled 2017-08-25: qty 20

## 2017-08-25 MED ORDER — SODIUM CHLORIDE 0.9% FLUSH
2.0000 mL | Freq: Once | INTRAVENOUS | Status: AC
  Administered 2017-08-25: 10 mL

## 2017-08-25 MED ORDER — SODIUM CHLORIDE 0.9 % IJ SOLN
INTRAMUSCULAR | Status: AC
  Filled 2017-08-25: qty 10

## 2017-08-25 MED ORDER — IOPAMIDOL (ISOVUE-M 200) INJECTION 41%
10.0000 mL | Freq: Once | INTRAMUSCULAR | Status: AC
  Administered 2017-08-25: 10 mL via EPIDURAL
  Filled 2017-08-25: qty 10

## 2017-08-25 MED ORDER — ROPIVACAINE HCL 2 MG/ML IJ SOLN
2.0000 mL | Freq: Once | INTRAMUSCULAR | Status: AC
  Administered 2017-08-25: 2 mL via EPIDURAL
  Filled 2017-08-25: qty 10

## 2017-08-25 MED ORDER — TRIAMCINOLONE ACETONIDE 40 MG/ML IJ SUSP
40.0000 mg | Freq: Once | INTRAMUSCULAR | Status: AC
  Administered 2017-08-25: 40 mg
  Filled 2017-08-25: qty 1

## 2017-08-25 NOTE — Patient Instructions (Addendum)
____________________________________________________________________________________________  Post-Procedure instructions Instructions:  Apply ice: Fill a plastic sandwich bag with crushed ice. Cover it with a small towel and apply to injection site. Apply for 15 minutes then remove x 15 minutes. Repeat sequence on day of procedure, until you go to bed. The purpose is to minimize swelling and discomfort after procedure.  Apply heat: Apply heat to procedure site starting the day following the procedure. The purpose is to treat any soreness and discomfort from the procedure.  Food intake: Start with clear liquids (like water) and advance to regular food, as tolerated.   Physical activities: Keep activities to a minimum for the first 8 hours after the procedure.   Driving: If you have received any sedation, you are not allowed to drive for 24 hours after your procedure.  Blood thinner: Restart your blood thinner 6 hours after your procedure. (Only for those taking blood thinners)  Insulin: As soon as you can eat, you may resume your normal dosing schedule. (Only for those taking insulin)  Infection prevention: Keep procedure site clean and dry.  Post-procedure Pain Diary: Extremely important that this be done correctly and accurately. Recorded information will be used to determine the next step in treatment.  Pain evaluated is that of treated area only. Do not include pain from an untreated area.  Complete every hour, on the hour, for the initial 8 hours. Set an alarm to help you do this part accurately.  Do not go to sleep and have it completed later. It will not be accurate.  Follow-up appointment: Keep your follow-up appointment after the procedure. Usually 2 weeks for most procedures. (6 weeks in the case of radiofrequency.) Bring you pain diary.  Expect:  From numbing medicine (AKA: Local Anesthetics): Numbness or decrease in pain.  Onset: Full effect within 15 minutes of  injected.  Duration: It will depend on the type of local anesthetic used. On the average, 1 to 8 hours.   From steroids: Decrease in swelling or inflammation. Once inflammation is improved, relief of the pain will follow.  Onset of benefits: Depends on the amount of swelling present. The more swelling, the longer it will take for the benefits to be seen. In some cases, up to 10 days.  Duration: Steroids will stay in the system x 2 weeks. Duration of benefits will depend on multiple posibilities including persistent irritating factors.  From procedure: Some discomfort is to be expected once the numbing medicine wears off. This should be minimal if ice and heat are applied as instructed. Call if:  You experience numbness and weakness that gets worse with time, as opposed to wearing off.  New onset bowel or bladder incontinence. (Spinal procedures only)  Emergency Numbers:  Durning business hours (Monday - Thursday, 8:00 AM - 4:00 PM) (Friday, 9:00 AM - 12:00 Noon): [phone]  After hours: [phone] ____________________________________________________________________________________________  Pain Management Discharge Instructions  General Discharge Instructions :  If you need to reach your doctor call: Monday-Friday 8:00 am - 4:00 pm at [phone] or toll free [phone].  After clinic hours [phone] to have operator reach doctor.  Bring all of your medication bottles to all your appointments in the pain clinic.  To cancel or reschedule your appointment with Pain Management please remember to call 24 hours in advance to avoid a fee.  Refer to the educational materials which you have been given on: General Risks, I had my Procedure. Discharge Instructions, Post Sedation.  Post Procedure Instructions:  The drugs you   were given will stay in your system until tomorrow, so for the next 24 hours you should not drive, make any legal decisions or drink any alcoholic  beverages.  You may eat anything you prefer, but it is better to start with liquids then soups and crackers, and gradually work up to solid foods.  Please notify your doctor immediately if you have any unusual bleeding, trouble breathing or pain that is not related to your normal pain.  Depending on the type of procedure that was done, some parts of your body may feel week and/or numb.  This usually clears up by tonight or the next day.  Walk with the use of an assistive device or accompanied by an adult for the 24 hours.  You may use ice on the affected area for the first 24 hours.  Put ice in a Ziploc bag and cover with a towel and place against area 15 minutes on 15 minutes off.  You may switch to heat after 24 hours. Epidural Steroid Injection An epidural steroid injection is a shot of steroid medicine and numbing medicine that is given into the space between the spinal cord and the bones in your back (epidural space). The shot helps relieve pain caused by an irritated or swollen nerve root. The amount of pain relief you get from the injection depends on what is causing the nerve to be swollen and irritated, and how long your pain lasts. You are more likely to benefit from this injection if your pain is strong and comes on suddenly rather than if you have had pain for a long time. Tell a health care provider about:  Any allergies you have.  All medicines you are taking, including vitamins, herbs, eye drops, creams, and over-the-counter medicines.  Any problems you or family members have had with anesthetic medicines.  Any blood disorders you have.  Any surgeries you have had.  Any medical conditions you have.  Whether you are pregnant or may be pregnant. What are the risks? Generally, this is a safe procedure. However, problems may occur, including:  Headache.  Bleeding.  Infection.  Allergic reaction to medicines.  Damage to your nerves.  What happens before the  procedure? Staying hydrated Follow instructions from your health care provider about hydration, which may include:  Up to 2 hours before the procedure - you may continue to drink clear liquids, such as water, clear fruit juice, black coffee, and plain tea.  Eating and drinking restrictions Follow instructions from your health care provider about eating and drinking, which may include:  8 hours before the procedure - stop eating heavy meals or foods such as meat, fried foods, or fatty foods.  6 hours before the procedure - stop eating light meals or foods, such as toast or cereal.  6 hours before the procedure - stop drinking milk or drinks that contain milk.  2 hours before the procedure - stop drinking clear liquids.  Medicine  You may be given medicines to lower anxiety.  Ask your health care provider about: ? Changing or stopping your regular medicines. This is especially important if you are taking diabetes medicines or blood thinners. ? Taking medicines such as aspirin and ibuprofen. These medicines can thin your blood. Do not take these medicines before your procedure if your health care provider instructs you not to. General instructions  Plan to have someone take you home from the hospital or clinic. What happens during the procedure?  You may receive a medicine   to help you relax (sedative).  You will be asked to lie on your abdomen.  The injection site will be cleaned.  A numbing medicine (local anesthetic) will be used to numb the injection site.  A needle will be inserted through your skin into the epidural space. You may feel some discomfort when this happens. An X-ray machine will be used to make sure the needle is put as close as possible to the affected nerve.  A steroid medicine and a local anesthetic will be injected into the epidural space.  The needle will be removed.  A bandage (dressing) will be put over the injection site. What happens after the  procedure?  Your blood pressure, heart rate, breathing rate, and blood oxygen level will be monitored until the medicines you were given have worn off.  Your arm or leg may feel weak or numb for a few hours.  The injection site may feel sore.  Do not drive for 24 hours if you received a sedative. This information is not intended to replace advice given to you by your health care provider. Make sure you discuss any questions you have with your health care provider. Document Released: 01/25/2008 Document Revised: 03/31/2016 Document Reviewed: 02/03/2016 Elsevier Interactive Patient Education  2017 [employer]  

## 2017-08-25 NOTE — Progress Notes (Signed)
Patient's Name: Jose Washington  MRN: [id number]  Referring Provider: Vevelyn Francois, NP  DOB: 1965/12/04  PCP: Kathrine Haddock, NP  DOS: 08/25/2017  Note by: Gaspar Cola, MD  Service setting: Ambulatory outpatient  Specialty: Interventional Pain Management  Patient type: Established  Location: [facility] (AMB) Pain Management Facility  Visit type: Interventional Procedure   Primary Reason for Visit: Interventional Pain Management Treatment. CC: Back Pain (lower left)  Procedure:  Anesthesia, Analgesia, Anxiolysis:  Type: Therapeutic Inter-Laminar Epidural Steroid Injection Region: Lumbar Level: L4-5 Level. Laterality: Left-Sided Paramedial  Type: Local Anesthesia Local Anesthetic: Lidocaine 1% Route: Infiltration ([place]/IM) IV Access: Declined Sedation: Declined  Indication(s): Analgesia           Indications: 1. Grade 1 Retrolisthesis of L3 over L4   2. Chronic knee pain (Left)   3. Chronic lower extremity pain (Left)   4. Other intervertebral disc degeneration, lumbar region   5. Chronic low back pain (Location of Secondary source of pain) (Bilateral) (L>R)    Pain Score: Pre-procedure: 2 /10 Post-procedure: 2 /10  Pre-op Assessment:  Jose Washington is a 51 y.o. (year old), male patient, seen today for interventional treatment. He  has a past surgical history that includes eye muscle repair (1972 and 1975) and bLEEDING ULCER (2012). Jose Washington has a current medication list which includes the following prescription(s): acetaminophen, atenolol, atorvastatin, baclofen, buspirone, calcium carbonate, vitamin d3, chondroitin sulfate, desloratadine-pseudoephedrine, diphenhydramine hcl, duloxetine, fenofibrate, fluticasone, glucosamine-chondroit-vit c-mn, hydrocodone-acetaminophen, hydrocodone-acetaminophen, hydrocodone-acetaminophen, ibuprofen, melatonin, methylsulfonylmethane, multivitamin, omeprazole, ondansetron, potassium, and vitamin b-12. His primarily concern today is the Back Pain  (lower left)  Initial Vital Signs: There were no vitals taken for this visit. BMI: Estimated body mass index is 28.19 kg/m as calculated from the following:   Height as of this encounter: 5\' 7"  (1.702 m).   Weight as of this encounter: 180 lb (81.6 kg).  Risk Assessment: Allergies: Reviewed. He has No Known Allergies.  Allergy Precautions: None required Coagulopathies: Reviewed. None identified.  Blood-thinner therapy: None at this time Active Infection(s): Reviewed. None identified. Jose Washington is afebrile  Site Confirmation: Jose Washington was asked to confirm the procedure and laterality before marking the site Procedure checklist: Completed Consent: Before the procedure and under the influence of no sedative(s), amnesic(s), or anxiolytics, the patient was informed of the treatment options, risks and possible complications. To fulfill our ethical and legal obligations, as recommended by the American Medical Association's Code of Ethics, I have informed the patient of my clinical impression; the nature and purpose of the treatment or procedure; the risks, benefits, and possible complications of the intervention; the alternatives, including doing nothing; the risk(s) and benefit(s) of the alternative treatment(s) or procedure(s); and the risk(s) and benefit(s) of doing nothing. The patient was provided information about the general risks and possible complications associated with the procedure. These may include, but are not limited to: failure to achieve desired goals, infection, bleeding, organ or nerve damage, allergic reactions, paralysis, and death. In addition, the patient was informed of those risks and complications associated to Spine-related procedures, such as failure to decrease pain; infection (i.e.: Meningitis, epidural or intraspinal abscess); bleeding (i.e.: epidural hematoma, subarachnoid hemorrhage, or any other type of intraspinal or peri-dural bleeding); organ or nerve damage  (i.e.: Any type of peripheral nerve, nerve root, or spinal cord injury) with subsequent damage to sensory, motor, and/or autonomic systems, resulting in permanent pain, numbness, and/or weakness of one or several areas of the body; allergic reactions; (i.e.: anaphylactic reaction);  and/or death. Furthermore, the patient was informed of those risks and complications associated with the medications. These include, but are not limited to: allergic reactions (i.e.: anaphylactic or anaphylactoid reaction(s)); adrenal axis suppression; blood sugar elevation that in diabetics may result in ketoacidosis or comma; water retention that in patients with history of congestive heart failure may result in shortness of breath, pulmonary edema, and decompensation with resultant heart failure; weight gain; swelling or edema; medication-induced neural toxicity; particulate matter embolism and blood vessel occlusion with resultant organ, and/or nervous system infarction; and/or aseptic necrosis of one or more joints. Finally, the patient was informed that Medicine is not an exact science; therefore, there is also the possibility of unforeseen or unpredictable risks and/or possible complications that may result in a catastrophic outcome. The patient indicated having understood very clearly. We have given the patient no guarantees and we have made no promises. Enough time was given to the patient to ask questions, all of which were answered to the patient's satisfaction. Jose Washington has indicated that he wanted to continue with the procedure. Attestation: I, the ordering provider, attest that I have discussed with the patient the benefits, risks, side-effects, alternatives, likelihood of achieving goals, and potential problems during recovery for the procedure that I have provided informed consent. Date: 08/25/2017; Time: 7:47 AM  Pre-Procedure Preparation:  Monitoring: As per clinic protocol. Respiration, ETCO2, SpO2, BP, heart  rate and rhythm monitor placed and checked for adequate function Safety Precautions: Patient was assessed for positional comfort and pressure points before starting the procedure. Time-out: I initiated and conducted the "Time-out" before starting the procedure, as per protocol. The patient was asked to participate by confirming the accuracy of the "Time Out" information. Verification of the correct person, site, and procedure were performed and confirmed by me, the nursing staff, and the patient. "Time-out" conducted as per Joint Commission's Universal Protocol (UP.01.01.01). "Time-out" Date & Time: 08/25/2017; 0955 hrs.  Description of Procedure Process:   Position: Prone with head of the table was raised to facilitate breathing. Target Area: The interlaminar space, initially targeting the lower laminar border of the superior vertebral body. Approach: Paramedial approach. Area Prepped: Entire Posterior Lumbar Region Prepping solution: ChloraPrep (2% chlorhexidine gluconate and 70% isopropyl alcohol) Safety Precautions: Aspiration looking for blood return was conducted prior to all injections. At no point did we inject any substances, as a needle was being advanced. No attempts were made at seeking any paresthesias. Safe injection practices and needle disposal techniques used. Medications properly checked for expiration dates. SDV (single dose vial) medications used. Description of the Procedure: Protocol guidelines were followed. The procedure needle was introduced through the skin, ipsilateral to the reported pain, and advanced to the target area. Bone was contacted and the needle walked caudad, until the lamina was cleared. The epidural space was identified using "loss-of-resistance technique" with 2-3 ml of PF-NaCl (0.9% NSS), in a 5cc LOR glass syringe. Vitals:   08/25/17 0955 08/25/17 1000 08/25/17 1004 08/25/17 1009  BP: (!) 121/49 134/72 137/76 138/68  Pulse: 60 66 67 67  Resp: (!) 23 (!) 24  18 18   Temp:      TempSrc:      SpO2: 100% 100% 100% 99%  Weight:      Height:        Start Time: 0955 hrs. End Time: 1003 hrs. Materials:  Needle(s) Type: Epidural needle Gauge: 17G Length: 3.5-in Medication(s): We administered iopamidol, triamcinolone acetonide, ropivacaine (PF) 2 mg/mL (0.2%), sodium chloride flush, and lidocaine.  Please see chart orders for dosing details.  Imaging Guidance (Spinal):  Type of Imaging Technique: Fluoroscopy Guidance (Spinal) Indication(s): Assistance in needle guidance and placement for procedures requiring needle placement in or near specific anatomical locations not easily accessible without such assistance. Exposure Time: Please see nurses notes. Contrast: Before injecting any contrast, we confirmed that the patient did not have an allergy to iodine, shellfish, or radiological contrast. Once satisfactory needle placement was completed at the desired level, radiological contrast was injected. Contrast injected under live fluoroscopy. No contrast complications. See chart for type and volume of contrast used. Fluoroscopic Guidance: I was personally present during the use of fluoroscopy. "Tunnel Vision Technique" used to obtain the best possible view of the target area. Parallax error corrected before commencing the procedure. "Direction-depth-direction" technique used to introduce the needle under continuous pulsed fluoroscopy. Once target was reached, antero-posterior, oblique, and lateral fluoroscopic projection used confirm needle placement in all planes. Images permanently stored in EMR. Interpretation: I personally interpreted the imaging intraoperatively. Adequate needle placement confirmed in multiple planes. Appropriate spread of contrast into desired area was observed. No evidence of afferent or efferent intravascular uptake. No intrathecal or subarachnoid spread observed. Permanent images saved into the patient's record.  Antibiotic Prophylaxis:   Indication(s): None identified Antibiotic given: None  Post-operative Assessment:  EBL: None Complications: No immediate post-treatment complications observed by team, or reported by patient. Note: The patient tolerated the entire procedure well. A repeat set of vitals were taken after the procedure and the patient was kept under observation following institutional policy, for this type of procedure. Post-procedural neurological assessment was performed, showing return to baseline, prior to discharge. The patient was provided with post-procedure discharge instructions, including a section on how to identify potential problems. Should any problems arise concerning this procedure, the patient was given instructions to immediately contact us, at any time, without hesitation. In any case, we plan to contact the patient by telephone for a follow-up status report regarding this interventional procedure. Comments:  No additional relevant information.  Plan of Care    Imaging Orders     DG Knee 1-2 Views Left     MR LUMBAR SPINE WO CONTRAST     DG C-Arm 1-60 Min-No Report  Procedure Orders     Lumbar Epidural Injection  Medications ordered for procedure: Meds ordered this encounter  Medications  . iopamidol (ISOVUE-M) 41 % intrathecal injection 10 mL  . triamcinolone acetonide (KENALOG-40) injection 40 mg  . ropivacaine (PF) 2 mg/mL (0.2%) (NAROPIN) injection 2 mL  . sodium chloride flush (NS) 0.9 % injection 2 mL  . lidocaine (XYLOCAINE) 2 % (with pres) injection 200 mg   Medications administered: We administered iopamidol, triamcinolone acetonide, ropivacaine (PF) 2 mg/mL (0.2%), sodium chloride flush, and lidocaine.  See the medical record for exact dosing, route, and time of administration.  New Prescriptions   No medications on file   Disposition: Discharge home  Discharge Date & Time: 08/25/2017; 1012 hrs.   Physician-requested Follow-up: Return for post-procedure eval by Dr.  Dossie Arbour in 2 wks. Future Appointments Date Time Provider [place]  09/12/2017 9:15 AM Milinda Pointer, MD [facility] None  10/19/2017 10:45 AM Vevelyn Francois, NP [facility] None  02/24/2018 10:00 AM Kathrine Haddock, NP [facility]   Primary Care Physician: Kathrine Haddock, NP Location: [facility] Facility Note by: Gaspar Cola, MD Date: 08/25/2017; Time: 10:50 AM  Disclaimer:  Medicine is not an Chief Strategy Officer. The only guarantee in medicine is  that nothing is guaranteed. It is important to note that the decision to proceed with this intervention was based on the information collected from the patient. The Data and conclusions were drawn from the patient's questionnaire, the interview, and the physical examination. Because the information was provided in large part by the patient, it cannot be guaranteed that it has not been purposely or unconsciously manipulated. Every effort has been made to obtain as much relevant data as possible for this evaluation. It is important to note that the conclusions that lead to this procedure are derived in large part from the available data. Always take into account that the treatment will also be dependent on availability of resources and existing treatment guidelines, considered by other Pain Management Practitioners as being common knowledge and practice, at the time of the intervention. For Medico-Legal purposes, it is also important to point out that variation in procedural techniques and pharmacological choices are the acceptable norm. The indications, contraindications, technique, and results of the above procedure should only be interpreted and judged by a Board-Certified Interventional Pain Specialist with extensive familiarity and expertise in the same exact procedure and technique.

## 2017-08-26 ENCOUNTER — Telehealth: Payer: Self-pay

## 2017-08-26 NOTE — Telephone Encounter (Signed)
Post procedure phone call.  Patient states he is doing fine.  

## 2017-08-30 ENCOUNTER — Ambulatory Visit
Admission: RE | Admit: 2017-08-30 | Discharge: 2017-08-30 | Disposition: A | Discharge status: Home / Self Care | Payer: 59 | Source: Ambulatory Visit | Attending: Pain Medicine | Admitting: Pain Medicine

## 2017-08-30 DIAGNOSIS — M25562 Pain in left knee: Secondary | ICD-10-CM | POA: Insufficient documentation

## 2017-08-30 DIAGNOSIS — G8929 Other chronic pain: Secondary | ICD-10-CM | POA: Diagnosis present

## 2017-09-07 ENCOUNTER — Ambulatory Visit
Admission: RE | Admit: 2017-09-07 | Discharge: 2017-09-07 | Disposition: A | Discharge status: Home / Self Care | Payer: 59 | Source: Ambulatory Visit | Attending: Pain Medicine | Admitting: Pain Medicine

## 2017-09-07 DIAGNOSIS — G8929 Other chronic pain: Secondary | ICD-10-CM | POA: Diagnosis present

## 2017-09-07 DIAGNOSIS — M79605 Pain in left leg: Secondary | ICD-10-CM | POA: Diagnosis present

## 2017-09-07 DIAGNOSIS — M48061 Spinal stenosis, lumbar region without neurogenic claudication: Secondary | ICD-10-CM | POA: Diagnosis not present

## 2017-09-07 DIAGNOSIS — M5136 Other intervertebral disc degeneration, lumbar region: Secondary | ICD-10-CM

## 2017-09-07 DIAGNOSIS — M4310 Spondylolisthesis, site unspecified: Secondary | ICD-10-CM

## 2017-09-08 NOTE — Progress Notes (Signed)
Patient's Name: Jose Washington  MRN: [id number]  Referring Provider: Kathrine Haddock, NP  DOB: 08/09/1966  PCP: Kathrine Haddock, NP  DOS: 09/12/2017  Note by: Gaspar Cola, MD  Service setting: Ambulatory outpatient  Specialty: Interventional Pain Management  Location: [facility] (AMB) Pain Management Facility    Patient type: Established   Primary Reason(s) for Visit: Encounter for post-procedure evaluation of chronic illness with mild to moderate exacerbation CC: Back Pain (lower)  HPI  Jose Washington is a 51 y.o. year old, male patient, who comes today for a post-procedure evaluation. He has Chronic neck pain (Primary Source of Pain) (Bilateral) (L>R); Hyperlipidemia; Essential hypertension, benign; Gastritis; Chronic pain syndrome; Chronic low back pain (Secondary source of pain) (Bilateral) (L>R); Chronic upper back pain [facility] source of pain) (Bilateral) (L>R); Chronic shoulder pain (Bilateral) (L>R); Osteoarthritis of AC (acromioclavicular) joint (Right); Chronic sacroiliac joint pain (Bilateral) (L>R); Long term current use of opiate analgesic; Long term prescription opiate use; Opiate use (5 MME/Day); Muscle spasticity; Cervical DDD (C4-5, C5-6, C6-7 and C7-T1); Cervical foraminal stenosis (Bilateral: C5-6 & C6-7, Left: C4-5 & C7-T1); Cervical radiculitis (Bilateral) (L>R); Vitamin D insufficiency; Cervical facet syndrome (HCC) (L); Cervical spondylosis; Musculoskeletal neck pain (trapezius) (Left); Depression, major, single episode, severe ([place]); Chronic knee pain (Left); Chronic lower extremity pain (Left); Grade 1 Retrolisthesis of L3 over L4; Lumbar facet arthropathy (Bilateral); Lumbar facet osteoarthritis; Lumbar facet syndrome (Bilateral) (L>R); and Lumbar spondylosis on their problem list. His primarily concern today is the Back Pain (lower)  Pain Assessment: Location: Lower Back Radiating: denies Onset: More than a month ago Duration: Chronic pain Quality: Aching Severity: 1 /10  (self-reported pain score)  Note: Reported level is compatible with observation.                               Timing: Intermittent Modifying factors: rest, medications,Tylenol  Jose Washington comes in today for post-procedure evaluation after the treatment done on 08/25/2017.  Further details on both, my assessment(s), as well as the proposed treatment plan, please see below.  Post-Procedure Assessment  08/25/2017 Procedure: Diagnostic left L4-5 interlaminar lumbar epidural steroid injection #1 under fluoroscopic guidance, no sedation Pre-procedure pain score:  2/10 Post-procedure pain score: 2/10 No initial benefit, possible due to rapid discharge after no sedation procedure, without enough time to allow full onset of block. Influential Factors: BMI: 28.51 kg/m Intra-procedural challenges: None observed.         Assessment challenges: None detected.              Reported side-effects: None.        Post-procedural adverse reactions or complications: None reported         Sedation: No sedation used. When no sedatives are used, the analgesic levels obtained are directly associated to the effectiveness of the local anesthetics. However, when sedation is provided, the level of analgesia obtained during the initial 1 hour following the intervention, is believed to be the result of a combination of factors. These factors may include, but are not limited to: 1. The effectiveness of the local anesthetics used. 2. The effects of the analgesic(s) and/or anxiolytic(s) used. 3. The degree of discomfort experienced by the patient at the time of the procedure. 4. The patients ability and reliability in recalling and recording the events. 5. The presence and influence of possible secondary gains and/or psychosocial factors. Reported result: Relief experienced during the 1st hour after the procedure: 100 % (Ultra-Short  Term Relief)            Interpretative annotation: Clinically appropriate result. No IV  Analgesic or Anxiolytic given, therefore benefits are completely due to Local Anesthetic effects.          Effects of local anesthetic: The analgesic effects attained during this period are directly associated to the localized infiltration of local anesthetics and therefore cary significant diagnostic value as to the etiological location, or anatomical origin, of the pain. Expected duration of relief is directly dependent on the pharmacodynamics of the local anesthetic used. Long-acting (4-6 hours) anesthetics used.  Reported result: Relief during the next 4 to 6 hour after the procedure: 100 % (Short-Term Relief)            Interpretative annotation: Clinically appropriate result. Analgesia during this period is likely to be Local Anesthetic-related.          Long-term benefit: Defined as the period of time past the expected duration of local anesthetics (1 hour for short-acting and 4-6 hours for long-acting). With the possible exception of prolonged sympathetic blockade from the local anesthetics, benefits during this period are typically attributed to, or associated with, other factors such as analgesic sensory neuropraxia, antiinflammatory effects, or beneficial biochemical changes provided by agents other than the local anesthetics.  Reported result: Extended relief following procedure: 90 % (Long-Term Relief)            Interpretative annotation: Clinically appropriate result. Good relief. No permanent benefit expected. Inflammation plays a part in the etiology to the pain.          Current benefits: Defined as reported results that persistent at this point in time.   Analgesia: 90 %            Function: Somewhat improved ROM: Somewhat improved Interpretative annotation: Recurrence of symptoms. No permanent benefit expected. Effective diagnostic intervention.          Interpretation: Results would suggest a successful diagnostic intervention.                  Plan:  Set up procedure as a PRN  palliative treatment option for this patient.        Laboratory Chemistry  Inflammation Markers (CRP: Acute Phase) (ESR: Chronic Phase) Lab Results  Component Value Date   CRP 1.4 (H) 11/12/2016   ESRSEDRATE 3 11/12/2016                 Renal Function Markers Lab Results  Component Value Date   BUN 14 02/21/2017   CREATININE 1.15 02/21/2017   GFRAA 85 02/21/2017   GFRNONAA 74 02/21/2017                 Hepatic Function Markers Lab Results  Component Value Date   AST 35 02/21/2017   ALT 53 (H) 02/21/2017   ALBUMIN 4.9 02/21/2017   ALKPHOS 56 02/21/2017                 Electrolytes Lab Results  Component Value Date   NA 146 (H) 02/21/2017   K 4.7 02/21/2017   CL 106 02/21/2017   CALCIUM 9.8 02/21/2017   MG 2.0 11/12/2016                 Neuropathy Markers Lab Results  Component Value Date   VITAMINB12 820 11/12/2016                 Bone Pathology Markers Lab Results  Component Value Date  ALKPHOS 56 02/21/2017   25OHVITD1 23 (L) 11/12/2016   25OHVITD2 <1.0 11/12/2016   25OHVITD3 23 11/12/2016   CALCIUM 9.8 02/21/2017                 Rheumatology Markers Lab Results  Component Value Date   LABURIC 5.6 07/30/2015                Coagulation Parameters Lab Results  Component Value Date   PLT 389 (H) 02/21/2017                 Cardiovascular Markers Lab Results  Component Value Date   HGB 15.4 02/21/2017   HCT 44.5 02/21/2017                 CA Markers No results found for: CEA, CA125, LABCA2               Note: Lab results reviewed.  Recent Diagnostic Imaging Results  MR LUMBAR SPINE WO CONTRAST CLINICAL DATA:  Initial evaluation for low back pain with left leg pain since 2014.  EXAM: MRI LUMBAR SPINE WITHOUT CONTRAST  TECHNIQUE: Multiplanar, multisequence MR imaging of the lumbar spine was performed. No intravenous contrast was administered.  COMPARISON:  Prior radiograph from 11/12/2016.  FINDINGS: Segmentation: Normal  segmentation. Lowest well-formed disc labeled the L5-S1 level.  Alignment: Normal alignment with preservation of the normal lumbar lordosis. No listhesis.  Vertebrae: Vertebral body heights are well maintained. No evidence for acute or chronic fracture. Bone marrow signal intensity within normal limits. Mild reactive marrow edema about the right L4-5 facet due to facet degeneration (series 4, image 3).  Conus medullaris: Extends to the L1-2 level and appears normal.  Paraspinal and other soft tissues: Paraspinous soft tissues within normal limits. Visualized visceral structures are normal.  Disc levels:  L1-2:  Unremarkable.  L2-3:  Unremarkable.  L3-4: Mild diffuse disc bulge with disc desiccation and intervertebral disc space narrowing. Disc bulging eccentric to the left, contacting and potentially irritating the exiting left L3 nerve root as it courses of the left neural foramen (series 3, image 13). Mild facet ligamentum flavum hypertrophy. No significant canal or subarticular stenosis. Mild bilateral L3 foraminal narrowing.  L4-5: Mild diffuse disc bulge. Moderate right facet arthrosis with associated reactive edema (series 4, image 3). More mild left-sided facet arthrosis. Ligamentum flavum hypertrophy. No significant canal or subarticular stenosis. Mild to moderate bilateral L4 foraminal narrowing.  L5-S1: Normal interspace. Mild lateral facet arthrosis. No canal or neural foraminal stenosis.  IMPRESSION: 1. Left eccentric disc bulge at L3-4, contacting and potentially irritating the exiting left L3 nerve root. 2. Disc bulge with facet arthropathy at L4-5 with resultant mild to moderate bilateral L4 foraminal stenosis, slightly worse on the right. 3. Moderate facet arthropathy with associated reactive edema about the right L4-5 facet. Finding could serve as a source for lower back pain.  Electronically Signed   By: Jeannine Boga M.D.   On: 09/07/2017  14:33  Complexity Note: Imaging results reviewed. Results shared with Mr. Capizzi, using Layman's terms.                         Meds   Current Outpatient Medications:  .  acetaminophen (TYLENOL) 500 MG tablet, Take 500 mg by mouth every 6 (six) hours as needed., Disp: , Rfl:  .  atenolol (TENORMIN) 50 MG tablet, Take 1 tablet (50 mg total) by mouth daily., Disp: 90 tablet, Rfl: 1 .  atorvastatin (LIPITOR) 40 MG tablet, Take 1 tablet (40 mg total) by mouth daily., Disp: 90 tablet, Rfl: 1 .  baclofen (LIORESAL) 10 MG tablet, Take 1 tablet (10 mg total) by mouth at bedtime., Disp: 90 tablet, Rfl: 0 .  busPIRone (BUSPAR) 30 MG tablet, Take 1 tablet (30 mg total) by mouth 2 (two) times daily., Disp: 180 tablet, Rfl: 1 .  Calcium Carbonate (CALCIUM 600 PO), Take 600 mg by mouth daily., Disp: , Rfl:  .  Cholecalciferol (VITAMIN D3) 2000 units capsule, Take 1 capsule (2,000 Units total) by mouth daily., Disp: 30 capsule, Rfl: PRN .  Chondroitin Sulfate 400 MG CAPS, Take by mouth 2 (two) times daily., Disp: , Rfl:  .  desloratadine-pseudoephedrine (CLARINEX-D 12-HOUR) 2.5-120 MG 12 hr tablet, Take 1 tablet by mouth as needed., Disp: , Rfl:  .  DiphenhydrAMINE HCl (DIPHEDRYL ALLERGY PO), Take 25 mg by mouth daily., Disp: , Rfl:  .  DULoxetine (CYMBALTA) 30 MG capsule, Take 1 capsule (30 mg total) by mouth 2 (two) times daily., Disp: 180 capsule, Rfl: 1 .  fenofibrate 160 MG tablet, Take 1 tablet (160 mg total) by mouth daily., Disp: 90 tablet, Rfl: 1 .  fluticasone (FLONASE) 50 MCG/ACT nasal spray, Place 2 sprays into both nostrils 2 (two) times daily., Disp: , Rfl:  .  Glucosamine-Chondroit-Vit C-Mn (GLUCOSAMINE 1500 COMPLEX PO), Take by mouth 2 (two) times daily., Disp: , Rfl:  .  HYDROcodone-acetaminophen (NORCO/VICODIN) 5-325 MG tablet, Take 1 tablet by mouth daily as needed for moderate pain., Disp: 15 tablet, Rfl: 0 .  [START ON 09/26/2017] HYDROcodone-acetaminophen (NORCO/VICODIN) 5-325 MG  tablet, Take 1 tablet by mouth daily as needed for moderate pain., Disp: 15 tablet, Rfl: 0 .  ibuprofen (ADVIL,MOTRIN) 200 MG tablet, Take 200 mg by mouth 3 (three) times a week., Disp: , Rfl:  .  Melatonin 3 MG CAPS, Take by mouth at bedtime. , Disp: , Rfl:  .  Methylsulfonylmethane (MSM PO), Take by mouth 2 (two) times daily., Disp: , Rfl:  .  Multiple Vitamin (MULTIVITAMIN) tablet, Take 1 tablet by mouth daily., Disp: , Rfl:  .  omeprazole (PRILOSEC) 20 MG capsule, TAKE 1 CAPSULE (20 MG TOTAL) BY MOUTH DAILY., Disp: 90 capsule, Rfl: 1 .  ondansetron (ZOFRAN) 8 MG tablet, Take 1 tablet (8 mg total) by mouth 2 (two) times daily., Disp: 180 tablet, Rfl: 3 .  Potassium 99 MG TABS, Take 1 tablet by mouth., Disp: , Rfl:  .  vitamin B-12 (CYANOCOBALAMIN) 1000 MCG tablet, Take 1,000 mcg by mouth daily., Disp: , Rfl:  .  HYDROcodone-acetaminophen (NORCO/VICODIN) 5-325 MG tablet, Take 1 tablet by mouth daily as needed for moderate pain., Disp: 15 tablet, Rfl: 0  ROS  Constitutional: Denies any fever or chills Gastrointestinal: No reported hemesis, hematochezia, vomiting, or acute GI distress Musculoskeletal: Denies any acute onset joint swelling, redness, loss of ROM, or weakness Neurological: No reported episodes of acute onset apraxia, aphasia, dysarthria, agnosia, amnesia, paralysis, loss of coordination, or loss of consciousness  Allergies  Mr. Ta has No Known Allergies.  [place]  Drug: Mr. Mofield  reports that he does not use drugs. Alcohol:  reports that he does not drink alcohol. Tobacco:  reports that  has never smoked. he has never used smokeless tobacco. Medical:  has a past medical history of Allergy, Anxiety, Chronic duodenal ulcer with hemorrhage (2012), Chronic neck pain, Depression, Hyperlipidemia, Hypertension, and Microscopic hematuria. Surgical: [name]  has a past surgical history that includes eye  muscle repair (1972 and 1975) and bLEEDING ULCER (2012). Family: family  history includes Allergies in his brother; Cancer in his maternal grandfather and mother; Dementia in his father and paternal grandfather; Depression in his father; Diabetes in his mother; Mental illness in his father; Stroke in his paternal grandfather.  Constitutional Exam  General appearance: Well nourished, well developed, and well hydrated. In no apparent acute distress Vitals:   09/12/17 0914  BP: 130/78  Pulse: (!) 58  Resp: 16  Temp: 98.5 F (36.9 C)  TempSrc: Oral  SpO2: 100%  Weight: 182 lb (82.6 kg)  Height: '5\' 7"'  (1.702 m)   BMI Assessment: Estimated body mass index is 28.51 kg/m as calculated from the following:   Height as of this encounter: '5\' 7"'  (1.702 m).   Weight as of this encounter: 182 lb (82.6 kg).  BMI interpretation table: BMI level Category Range association with higher incidence of chronic pain  <18 kg/m2 Underweight   18.5-24.9 kg/m2 Ideal body weight   25-29.9 kg/m2 Overweight Increased incidence by 20%  30-34.9 kg/m2 Obese (Class I) Increased incidence by 68%  35-39.9 kg/m2 Severe obesity (Class II) Increased incidence by 136%  >40 kg/m2 Extreme obesity (Class III) Increased incidence by 254%   BMI Readings from Last 4 Encounters:  09/12/17 28.51 kg/m  08/25/17 28.19 kg/m  08/22/17 28.85 kg/m  07/28/17 28.82 kg/m   Wt Readings from Last 4 Encounters:  09/12/17 182 lb (82.6 kg)  08/25/17 180 lb (81.6 kg)  08/22/17 180 lb 6.4 oz (81.8 kg)  07/28/17 184 lb (83.5 kg)  Psych/Mental status: Alert, oriented x 3 (person, place, & time)       Eyes: PERLA Respiratory: No evidence of acute respiratory distress  Cervical Spine Area Exam  Skin & Axial Inspection: No masses, redness, edema, swelling, or associated skin lesions Alignment: Symmetrical Functional ROM: Unrestricted ROM      Stability: No instability detected Muscle Tone/Strength: Functionally intact. No obvious neuro-muscular anomalies detected. Sensory (Neurological):  Unimpaired Palpation: No palpable anomalies              Upper Extremity (UE) Exam    Side: Right upper extremity  Side: Left upper extremity  Skin & Extremity Inspection: Skin color, temperature, and hair growth are WNL. No peripheral edema or cyanosis. No masses, redness, swelling, asymmetry, or associated skin lesions. No contractures.  Skin & Extremity Inspection: Skin color, temperature, and hair growth are WNL. No peripheral edema or cyanosis. No masses, redness, swelling, asymmetry, or associated skin lesions. No contractures.  Functional ROM: Unrestricted ROM          Functional ROM: Unrestricted ROM          Muscle Tone/Strength: Functionally intact. No obvious neuro-muscular anomalies detected.  Muscle Tone/Strength: Functionally intact. No obvious neuro-muscular anomalies detected.  Sensory (Neurological): Unimpaired          Sensory (Neurological): Unimpaired          Palpation: No palpable anomalies              Palpation: No palpable anomalies              Specialized Test(s): Deferred         Specialized Test(s): Deferred          Thoracic Spine Area Exam  Skin & Axial Inspection: No masses, redness, or swelling Alignment: Symmetrical Functional ROM: Unrestricted ROM Stability: No instability detected Muscle Tone/Strength: Functionally intact. No obvious neuro-muscular anomalies detected. Sensory (Neurological): Unimpaired Muscle strength &  Tone: No palpable anomalies  Lumbar Spine Area Exam  Skin & Axial Inspection: No masses, redness, or swelling Alignment: Symmetrical Functional ROM: Improved after treatment      Stability: No instability detected Muscle Tone/Strength: Functionally intact. No obvious neuro-muscular anomalies detected. Sensory (Neurological): Unimpaired Palpation: No palpable anomalies       Provocative Tests: Lumbar Hyperextension and rotation test: evaluation deferred today       Lumbar Lateral bending test: evaluation deferred today       Patrick's  Maneuver: evaluation deferred today                    Gait & Posture Assessment  Ambulation: Unassisted Gait: Relatively normal for age and body habitus Posture: WNL   Lower Extremity Exam    Side: Right lower extremity  Side: Left lower extremity  Skin & Extremity Inspection: Skin color, temperature, and hair growth are WNL. No peripheral edema or cyanosis. No masses, redness, swelling, asymmetry, or associated skin lesions. No contractures.  Skin & Extremity Inspection: Skin color, temperature, and hair growth are WNL. No peripheral edema or cyanosis. No masses, redness, swelling, asymmetry, or associated skin lesions. No contractures.  Functional ROM: Unrestricted ROM          Functional ROM: Unrestricted ROM          Muscle Tone/Strength: Functionally intact. No obvious neuro-muscular anomalies detected.  Muscle Tone/Strength: Functionally intact. No obvious neuro-muscular anomalies detected.  Sensory (Neurological): Unimpaired  Sensory (Neurological): Unimpaired  Palpation: No palpable anomalies  Palpation: No palpable anomalies   Assessment  Primary Diagnosis & Pertinent Problem List: The primary encounter diagnosis was Chronic lower extremity pain (Left). Diagnoses of Grade 1 Retrolisthesis of L3 over L4, Chronic low back pain (Secondary source of pain) (Bilateral) (L>R), Chronic neck pain (Primary Source of Pain) (Bilateral) (L>R), and Chronic upper back pain [facility] source of pain) (Bilateral) (L>R) were also pertinent to this visit.  Status Diagnosis  Resolved Stable Improved 1. Chronic lower extremity pain (Left)   2. Grade 1 Retrolisthesis of L3 over L4   3. Chronic low back pain (Secondary source of pain) (Bilateral) (L>R)   4. Chronic neck pain (Primary Source of Pain) (Bilateral) (L>R)   5. Chronic upper back pain [facility] source of pain) (Bilateral) (L>R)     Problems updated and reviewed during this visit: No problems updated. Plan of Care  Pharmacotherapy  (Medications Ordered): No orders of the defined types were placed in this encounter. This SmartLink is deprecated. Use AVSMEDLIST instead to display the medication list for a patient. Medications administered today: Sallee Lange. Shearer had no medications administered during this visit.  Procedure Orders    No procedure(s) ordered today   Lab Orders  No laboratory test(s) ordered today   Imaging Orders  No imaging studies ordered today   Referral Orders  No referral(s) requested today    Interventional management options: Planned, scheduled, and/or pending:   None at this time   Considering:   Diagnostic bilateral lumbar facetblock  Possible bilateral lumbar facet RFA Palliative left-sided cervical epidural steroid injection #2  Diagnostic bilateral cervical facetblock  Possible bilateral cervical facet RFA Diagnostic bilateral sacroiliac joint block Possible bilateral sacroiliac joint RFA Diagnostic trigger point injections   Palliative PRN treatment(s):   Diagnostic left L4-5 interlaminar lumbar epidural steroid injection #2  Palliative left-sided cervical epidural steroid injection #2    Provider-requested follow-up: Return for Med-Mgmt (w/ Dionisio David, NP).  Future Appointments  Date Time Provider Department  Center  10/19/2017 10:45 AM Vevelyn Francois, NP [facility] None  02/24/2018 10:00 AM Kathrine Haddock, NP [facility]   Primary Care Physician: Kathrine Haddock, NP Location: [facility] Note by: Gaspar Cola, MD Date: 09/12/2017; Time: 4:01 PM

## 2017-09-12 ENCOUNTER — Other Ambulatory Visit: Payer: Self-pay

## 2017-09-12 ENCOUNTER — Ambulatory Visit: Payer: 59 | Attending: Pain Medicine | Admitting: Pain Medicine

## 2017-09-12 ENCOUNTER — Encounter: Payer: Self-pay | Admitting: Pain Medicine

## 2017-09-12 VITALS — BP 130/78 | HR 58 | Temp 98.5°F | Resp 16 | Ht 67.0 in | Wt 182.0 lb

## 2017-09-12 DIAGNOSIS — F419 Anxiety disorder, unspecified: Secondary | ICD-10-CM | POA: Diagnosis not present

## 2017-09-12 DIAGNOSIS — R3129 Other microscopic hematuria: Secondary | ICD-10-CM | POA: Diagnosis not present

## 2017-09-12 DIAGNOSIS — K2970 Gastritis, unspecified, without bleeding: Secondary | ICD-10-CM | POA: Insufficient documentation

## 2017-09-12 DIAGNOSIS — M549 Dorsalgia, unspecified: Secondary | ICD-10-CM

## 2017-09-12 DIAGNOSIS — F329 Major depressive disorder, single episode, unspecified: Secondary | ICD-10-CM | POA: Diagnosis not present

## 2017-09-12 DIAGNOSIS — Z79899 Other long term (current) drug therapy: Secondary | ICD-10-CM | POA: Insufficient documentation

## 2017-09-12 DIAGNOSIS — M4802 Spinal stenosis, cervical region: Secondary | ICD-10-CM | POA: Diagnosis not present

## 2017-09-12 DIAGNOSIS — M542 Cervicalgia: Secondary | ICD-10-CM | POA: Diagnosis not present

## 2017-09-12 DIAGNOSIS — M5412 Radiculopathy, cervical region: Secondary | ICD-10-CM | POA: Insufficient documentation

## 2017-09-12 DIAGNOSIS — M47816 Spondylosis without myelopathy or radiculopathy, lumbar region: Secondary | ICD-10-CM | POA: Diagnosis not present

## 2017-09-12 DIAGNOSIS — M546 Pain in thoracic spine: Secondary | ICD-10-CM | POA: Diagnosis not present

## 2017-09-12 DIAGNOSIS — E559 Vitamin D deficiency, unspecified: Secondary | ICD-10-CM | POA: Insufficient documentation

## 2017-09-12 DIAGNOSIS — M47896 Other spondylosis, lumbar region: Secondary | ICD-10-CM | POA: Insufficient documentation

## 2017-09-12 DIAGNOSIS — G8929 Other chronic pain: Secondary | ICD-10-CM | POA: Diagnosis not present

## 2017-09-12 DIAGNOSIS — M50323 Other cervical disc degeneration at C6-C7 level: Secondary | ICD-10-CM | POA: Diagnosis not present

## 2017-09-12 DIAGNOSIS — M25511 Pain in right shoulder: Secondary | ICD-10-CM | POA: Diagnosis not present

## 2017-09-12 DIAGNOSIS — M25562 Pain in left knee: Secondary | ICD-10-CM | POA: Diagnosis not present

## 2017-09-12 DIAGNOSIS — M47812 Spondylosis without myelopathy or radiculopathy, cervical region: Secondary | ICD-10-CM | POA: Diagnosis not present

## 2017-09-12 DIAGNOSIS — M545 Low back pain: Secondary | ICD-10-CM | POA: Insufficient documentation

## 2017-09-12 DIAGNOSIS — M79605 Pain in left leg: Secondary | ICD-10-CM | POA: Diagnosis not present

## 2017-09-12 DIAGNOSIS — M4316 Spondylolisthesis, lumbar region: Secondary | ICD-10-CM | POA: Diagnosis not present

## 2017-09-12 DIAGNOSIS — E785 Hyperlipidemia, unspecified: Secondary | ICD-10-CM | POA: Diagnosis not present

## 2017-09-12 DIAGNOSIS — Z79891 Long term (current) use of opiate analgesic: Secondary | ICD-10-CM | POA: Diagnosis not present

## 2017-09-12 DIAGNOSIS — G894 Chronic pain syndrome: Secondary | ICD-10-CM | POA: Insufficient documentation

## 2017-09-12 DIAGNOSIS — M4310 Spondylolisthesis, site unspecified: Secondary | ICD-10-CM | POA: Diagnosis not present

## 2017-09-12 DIAGNOSIS — M25512 Pain in left shoulder: Secondary | ICD-10-CM | POA: Diagnosis not present

## 2017-09-12 DIAGNOSIS — I10 Essential (primary) hypertension: Secondary | ICD-10-CM | POA: Diagnosis not present

## 2017-09-12 NOTE — Progress Notes (Signed)
Safety precautions to be maintained throughout the outpatient stay will include: orient to surroundings, keep bed in low position, maintain call bell within reach at all times, provide assistance with transfer out of bed and ambulation.  

## 2017-09-12 NOTE — Patient Instructions (Signed)

## 2017-10-03 ENCOUNTER — Other Ambulatory Visit: Payer: Self-pay | Admitting: Unknown Physician Specialty

## 2017-10-19 ENCOUNTER — Encounter: Payer: Self-pay | Admitting: Nurse Practitioner

## 2017-10-19 ENCOUNTER — Other Ambulatory Visit: Payer: Self-pay

## 2017-10-19 ENCOUNTER — Ambulatory Visit: Payer: 59 | Attending: Nurse Practitioner | Admitting: Nurse Practitioner

## 2017-10-19 VITALS — BP 120/83 | HR 70 | Temp 98.4°F | Resp 16 | Ht 67.0 in | Wt 183.0 lb

## 2017-10-19 DIAGNOSIS — R252 Cramp and spasm: Secondary | ICD-10-CM | POA: Diagnosis not present

## 2017-10-19 DIAGNOSIS — E559 Vitamin D deficiency, unspecified: Secondary | ICD-10-CM | POA: Insufficient documentation

## 2017-10-19 DIAGNOSIS — M542 Cervicalgia: Secondary | ICD-10-CM

## 2017-10-19 DIAGNOSIS — M62838 Other muscle spasm: Secondary | ICD-10-CM | POA: Diagnosis not present

## 2017-10-19 DIAGNOSIS — M5412 Radiculopathy, cervical region: Secondary | ICD-10-CM | POA: Insufficient documentation

## 2017-10-19 DIAGNOSIS — M48061 Spinal stenosis, lumbar region without neurogenic claudication: Secondary | ICD-10-CM | POA: Insufficient documentation

## 2017-10-19 DIAGNOSIS — M549 Dorsalgia, unspecified: Secondary | ICD-10-CM | POA: Diagnosis not present

## 2017-10-19 DIAGNOSIS — M5033 Other cervical disc degeneration, cervicothoracic region: Secondary | ICD-10-CM | POA: Diagnosis not present

## 2017-10-19 DIAGNOSIS — M25512 Pain in left shoulder: Secondary | ICD-10-CM | POA: Diagnosis not present

## 2017-10-19 DIAGNOSIS — M4802 Spinal stenosis, cervical region: Secondary | ICD-10-CM | POA: Insufficient documentation

## 2017-10-19 DIAGNOSIS — G894 Chronic pain syndrome: Secondary | ICD-10-CM | POA: Diagnosis not present

## 2017-10-19 DIAGNOSIS — Z79899 Other long term (current) drug therapy: Secondary | ICD-10-CM | POA: Insufficient documentation

## 2017-10-19 DIAGNOSIS — M545 Low back pain, unspecified: Secondary | ICD-10-CM

## 2017-10-19 DIAGNOSIS — F329 Major depressive disorder, single episode, unspecified: Secondary | ICD-10-CM | POA: Insufficient documentation

## 2017-10-19 DIAGNOSIS — K2970 Gastritis, unspecified, without bleeding: Secondary | ICD-10-CM | POA: Diagnosis not present

## 2017-10-19 DIAGNOSIS — G8929 Other chronic pain: Secondary | ICD-10-CM | POA: Diagnosis not present

## 2017-10-19 DIAGNOSIS — M546 Pain in thoracic spine: Secondary | ICD-10-CM | POA: Diagnosis not present

## 2017-10-19 DIAGNOSIS — M25562 Pain in left knee: Secondary | ICD-10-CM | POA: Diagnosis not present

## 2017-10-19 DIAGNOSIS — I10 Essential (primary) hypertension: Secondary | ICD-10-CM | POA: Insufficient documentation

## 2017-10-19 DIAGNOSIS — M79605 Pain in left leg: Secondary | ICD-10-CM | POA: Insufficient documentation

## 2017-10-19 DIAGNOSIS — E785 Hyperlipidemia, unspecified: Secondary | ICD-10-CM | POA: Diagnosis not present

## 2017-10-19 DIAGNOSIS — M47816 Spondylosis without myelopathy or radiculopathy, lumbar region: Secondary | ICD-10-CM | POA: Insufficient documentation

## 2017-10-19 DIAGNOSIS — M25511 Pain in right shoulder: Secondary | ICD-10-CM | POA: Diagnosis not present

## 2017-10-19 DIAGNOSIS — Z79891 Long term (current) use of opiate analgesic: Secondary | ICD-10-CM | POA: Diagnosis not present

## 2017-10-19 DIAGNOSIS — M533 Sacrococcygeal disorders, not elsewhere classified: Secondary | ICD-10-CM | POA: Insufficient documentation

## 2017-10-19 MED ORDER — HYDROCODONE-ACETAMINOPHEN 5-325 MG PO TABS: 1.0000 | ORAL_TABLET | Freq: Every day | ORAL | 0 refills | Status: DC | PRN

## 2017-10-19 MED ORDER — BACLOFEN 10 MG PO TABS: 10.0000 mg | ORAL_TABLET | Freq: Every day | ORAL | 0 refills | Status: DC

## 2017-10-19 NOTE — Progress Notes (Signed)
Patient's Name: Jose Washington  MRN: [id number]  Referring Provider: Kathrine Haddock, NP  DOB: Mar 18, 1966  PCP: Kathrine Haddock, NP  DOS: 10/19/2017  Note by: Vevelyn Francois NP  Service setting: Ambulatory outpatient  Specialty: Interventional Pain Management  Location: [facility] (AMB) Pain Management Facility    Patient type: Established    Primary Reason(s) for Visit: Encounter for prescription drug management. (Level of risk: moderate)  CC: Neck Pain (left) and Back Pain (left, lower)  HPI  Jose Washington is a 51 y.o. year old, male patient, who comes today for a medication management evaluation. He has Chronic neck pain (Primary Source of Pain) (Bilateral) (L>R); Hyperlipidemia; Essential hypertension, benign; Gastritis; Chronic pain syndrome; Chronic low back pain (Secondary source of pain) (Bilateral) (L>R); Chronic upper back pain [facility] source of pain) (Bilateral) (L>R); Chronic shoulder pain (Bilateral) (L>R); Osteoarthritis of AC (acromioclavicular) joint (Right); Chronic sacroiliac joint pain (Bilateral) (L>R); Long term current use of opiate analgesic; Long term prescription opiate use; Opiate use (5 MME/Day); Muscle spasticity; Cervical DDD (C4-5, C5-6, C6-7 and C7-T1); Cervical foraminal stenosis (Bilateral: C5-6 & C6-7, Left: C4-5 & C7-T1); Cervical radiculitis (Bilateral) (L>R); Vitamin D insufficiency; Cervical facet syndrome (HCC) (L); Cervical spondylosis; Musculoskeletal neck pain (trapezius) (Left); Depression, major, single episode, severe ([place]); Chronic knee pain (Left); Chronic lower extremity pain (Left); Grade 1 Retrolisthesis of L3 over L4; Lumbar facet arthropathy (Bilateral); Lumbar facet osteoarthritis; Lumbar facet syndrome (Bilateral) (L>R); and Lumbar spondylosis on their problem list. His primarily concern today is the Neck Pain (left) and Back Pain (left, lower)  Pain Assessment: Location: Left Neck Radiating: denies Onset: More than a month ago Duration: Chronic  pain Quality: Aching, Constant Severity: 1 /10 (self-reported pain score)  Note: Reported level is compatible with observation.                          Effect on ADL:   Timing: Intermittent Modifying factors: Hydrocodone, Ibuprofen, heat, rest  Jose Washington was last scheduled for an appointment on 07/28/2017 for medication management. During today's appointment we reviewed Jose Washington chronic pain status, as well as his outpatient medication regimen. He states that his lumbar ESI injection is doing well. He states that he would like to have neck injection to see if this can help his pain.  He is going to start to use a TENS unit per guidance with Chiropractor.   The patient  reports that he does not use drugs. His body mass index is 28.66 kg/m.  Further details on both, my assessment(s), as well as the proposed treatment plan, please see below.  Controlled Substance Pharmacotherapy Assessment REMS (Risk Evaluation and Mitigation Strategy)  Analgesic:Hydrocodone/APAP 5/325 one daily MME/day:65m/day  WLandis Martins RN  10/19/2017 10:52 AM  Sign at close encounter Nursing Pain Medication Assessment:  Safety precautions to be maintained throughout the outpatient stay will include: orient to surroundings, keep bed in low position, maintain call bell within reach at all times, provide assistance with transfer out of bed and ambulation.  Medication Inspection Compliance: Pill count conducted under aseptic conditions, in front of the patient. Neither the pills nor the bottle was removed from the patient's sight at any time. Once count was completed pills were immediately returned to the patient in their original bottle.  Medication: Hydrocodone/APAP Pill/Patch Count: 1 of 15 pills remain Pill/Patch Appearance: Markings consistent with prescribed medication Bottle Appearance: Standard pharmacy container. Clearly labeled. Filled Date: 11/26 / 2018 Last Medication intake:  Yesterday    Pharmacokinetics: Liberation and absorption (onset of action): WNL Distribution (time to peak effect): WNL Metabolism and excretion (duration of action): WNL         Pharmacodynamics: Desired effects: Analgesia: Jose Washington reports >50% benefit. Functional ability: Patient reports that medication allows him to accomplish basic ADLs Clinically meaningful improvement in function (CMIF): Sustained CMIF goals met Perceived effectiveness: Described as relatively effective, allowing for increase in activities of daily living (ADL) Undesirable effects: Side-effects or Adverse reactions: None reported Monitoring: [place] PMP: Online review of the past 31-monthperiod conducted. Compliant with practice rules and regulations Last UDS on record: Summary  Date Value Ref Range Status  07/28/2017 FINAL  Final    Comment:    ==================================================================== TOXASSURE SELECT 13 (MW) ==================================================================== Test                             Result       Flag       Units Drug Present and Declared for Prescription Verification   Hydrocodone                    538          EXPECTED   ng/mg creat   Dihydrocodeine                 108          EXPECTED   ng/mg creat   Norhydrocodone                 342          EXPECTED   ng/mg creat    Sources of hydrocodone include scheduled prescription    medications. Dihydrocodeine and norhydrocodone are expected    metabolites of hydrocodone. Dihydrocodeine is also available as a    scheduled prescription medication. ==================================================================== Test                      Result    Flag   Units      Ref Range   Creatinine              371              mg/dL      >=20 ==================================================================== Declared Medications:  The flagging and interpretation on this report are based on the  following declared  medications.  Unexpected results may arise from  inaccuracies in the declared medications.  **Note: The testing scope of this panel includes these medications:  Hydrocodone (Norco)  **Note: The testing scope of this panel does not include following  reported medications:  Acetaminophen (Norco)  Acetaminophen (Tylenol)  Atenolol (Tenormin)  Atorvastatin (Lipitor)  Baclofen  Buspirone  Calcium Carbonate  Chondroitin  Chondroitin (Glucosamine-Chondroitin)  Cyanocobalamin  Desloratadine (Clarinex-D)  Diphenhydramine  Duloxetine (Cymbalta)  Fenofibrate  Fluticasone (Flonase)  Glucosamine (Glucosamine-Chondroitin)  Ibuprofen  Iron (Ferrous Sulfate)  Melatonin  Methylsulfonylmethane  Multivitamin  Omeprazole  Ondansetron  Potassium  Pseudoephedrine (Clarinex-D)  Turmeric  Vitamin D3 ==================================================================== For clinical consultation, please call ([phone] ====================================================================    UDS interpretation: Compliant          Medication Assessment Form: Reviewed. Patient indicates being compliant with therapy Treatment compliance: Compliant Risk Assessment Profile: Aberrant behavior: See prior evaluations. None observed or detected today Comorbid factors increasing risk of overdose: See prior notes. No additional risks detected today Risk of substance use disorder (SUD): Low  ORT Scoring interpretation table:  Score <3 = Low Risk for SUD  Score between 4-7 = Moderate Risk for SUD  Score >8 = High Risk for Opioid Abuse   Risk Mitigation Strategies:  Patient Counseling: Covered Patient-Prescriber Agreement (PPA): Present and active  Notification to other healthcare providers: Done  Pharmacologic Plan: No change in therapy, at this time  Laboratory Chemistry  Inflammation Markers (CRP: Acute Phase) (ESR: Chronic Phase) Lab Results  Component Value Date   CRP 1.4 (H) 11/12/2016    ESRSEDRATE 3 11/12/2016                 Rheumatology Markers Lab Results  Component Value Date   LABURIC 5.6 07/30/2015                Renal Function Markers Lab Results  Component Value Date   BUN 14 02/21/2017   CREATININE 1.15 02/21/2017   GFRAA 85 02/21/2017   GFRNONAA 74 02/21/2017                 Hepatic Function Markers Lab Results  Component Value Date   AST 35 02/21/2017   ALT 53 (H) 02/21/2017   ALBUMIN 4.9 02/21/2017   ALKPHOS 56 02/21/2017                 Electrolytes Lab Results  Component Value Date   NA 146 (H) 02/21/2017   K 4.7 02/21/2017   CL 106 02/21/2017   CALCIUM 9.8 02/21/2017   MG 2.0 11/12/2016                 Neuropathy Markers Lab Results  Component Value Date   VITAMINB12 820 11/12/2016                 Bone Pathology Markers Lab Results  Component Value Date   25OHVITD1 23 (L) 11/12/2016   25OHVITD2 <1.0 11/12/2016   25OHVITD3 23 11/12/2016                 Coagulation Parameters Lab Results  Component Value Date   PLT 389 (H) 02/21/2017                 Cardiovascular Markers Lab Results  Component Value Date   HGB 15.4 02/21/2017   HCT 44.5 02/21/2017                 CA Markers No results found for: CEA, CA125, LABCA2               Note: Lab results reviewed.  Recent Diagnostic Imaging Results  MR LUMBAR SPINE WO CONTRAST CLINICAL DATA:  Initial evaluation for low back pain with left leg pain since 2014.  EXAM: MRI LUMBAR SPINE WITHOUT CONTRAST  TECHNIQUE: Multiplanar, multisequence MR imaging of the lumbar spine was performed. No intravenous contrast was administered.  COMPARISON:  Prior radiograph from 11/12/2016.  FINDINGS: Segmentation: Normal segmentation. Lowest well-formed disc labeled the L5-S1 level.  Alignment: Normal alignment with preservation of the normal lumbar lordosis. No listhesis.  Vertebrae: Vertebral body heights are well maintained. No evidence for acute or chronic fracture.  Bone marrow signal intensity within normal limits. Mild reactive marrow edema about the right L4-5 facet due to facet degeneration (series 4, image 3).  Conus medullaris: Extends to the L1-2 level and appears normal.  Paraspinal and other soft tissues: Paraspinous soft tissues within normal limits. Visualized visceral structures are normal.  Disc levels:  L1-2:  Unremarkable.  L2-3:  Unremarkable.  L3-4:  Mild diffuse disc bulge with disc desiccation and intervertebral disc space narrowing. Disc bulging eccentric to the left, contacting and potentially irritating the exiting left L3 nerve root as it courses of the left neural foramen (series 3, image 13). Mild facet ligamentum flavum hypertrophy. No significant canal or subarticular stenosis. Mild bilateral L3 foraminal narrowing.  L4-5: Mild diffuse disc bulge. Moderate right facet arthrosis with associated reactive edema (series 4, image 3). More mild left-sided facet arthrosis. Ligamentum flavum hypertrophy. No significant canal or subarticular stenosis. Mild to moderate bilateral L4 foraminal narrowing.  L5-S1: Normal interspace. Mild lateral facet arthrosis. No canal or neural foraminal stenosis.  IMPRESSION: 1. Left eccentric disc bulge at L3-4, contacting and potentially irritating the exiting left L3 nerve root. 2. Disc bulge with facet arthropathy at L4-5 with resultant mild to moderate bilateral L4 foraminal stenosis, slightly worse on the right. 3. Moderate facet arthropathy with associated reactive edema about the right L4-5 facet. Finding could serve as a source for lower back pain.  Electronically Signed   By: Jeannine Boga M.D.   On: 09/07/2017 14:33  Complexity Note: Imaging results reviewed. Results shared with Mr. Morgan, using Layman's terms.                         Meds   Current Outpatient Medications:  .  acetaminophen (TYLENOL) 500 MG tablet, Take 500 mg by mouth every 6 (six) hours as  needed., Disp: , Rfl:  .  atenolol (TENORMIN) 50 MG tablet, Take 1 tablet (50 mg total) by mouth daily., Disp: 90 tablet, Rfl: 1 .  atorvastatin (LIPITOR) 40 MG tablet, Take 1 tablet (40 mg total) by mouth daily., Disp: 90 tablet, Rfl: 1 .  [START ON 10/26/2017] baclofen (LIORESAL) 10 MG tablet, Take 1 tablet (10 mg total) by mouth at bedtime., Disp: 90 tablet, Rfl: 0 .  busPIRone (BUSPAR) 30 MG tablet, Take 1 tablet (30 mg total) by mouth 2 (two) times daily., Disp: 180 tablet, Rfl: 1 .  Calcium Carbonate (CALCIUM 600 PO), Take 600 mg by mouth daily., Disp: , Rfl:  .  Cholecalciferol (VITAMIN D3) 2000 units TABS, Take by mouth daily., Disp: , Rfl:  .  Chondroitin Sulfate 400 MG CAPS, Take by mouth 2 (two) times daily., Disp: , Rfl:  .  desloratadine-pseudoephedrine (CLARINEX-D 12-HOUR) 2.5-120 MG 12 hr tablet, Take 1 tablet by mouth as needed., Disp: , Rfl:  .  DiphenhydrAMINE HCl (DIPHEDRYL ALLERGY PO), Take 25 mg by mouth daily., Disp: , Rfl:  .  DULoxetine (CYMBALTA) 30 MG capsule, Take 1 capsule (30 mg total) by mouth 2 (two) times daily., Disp: 180 capsule, Rfl: 1 .  fenofibrate 160 MG tablet, Take 1 tablet (160 mg total) by mouth daily., Disp: 90 tablet, Rfl: 1 .  fluticasone (FLONASE) 50 MCG/ACT nasal spray, Place 2 sprays into both nostrils 2 (two) times daily., Disp: , Rfl:  .  Glucosamine-Chondroit-Vit C-Mn (GLUCOSAMINE 1500 COMPLEX PO), Take by mouth 2 (two) times daily., Disp: , Rfl:  .  [START ON 11/25/2017] HYDROcodone-acetaminophen (NORCO/VICODIN) 5-325 MG tablet, Take 1 tablet by mouth daily as needed for moderate pain., Disp: 15 tablet, Rfl: 0 .  ibuprofen (ADVIL,MOTRIN) 200 MG tablet, Take 200 mg by mouth 3 (three) times a week., Disp: , Rfl:  .  Melatonin 5 MG TABS, Take by mouth at bedtime. , Disp: , Rfl:  .  Methylsulfonylmethane (MSM PO), Take by mouth 2 (two) times daily., Disp: , Rfl:  .  Multiple Vitamin (MULTIVITAMIN) tablet, Take 1 tablet by mouth daily., Disp: , Rfl:  .   omeprazole (PRILOSEC) 20 MG capsule, TAKE 1 CAPSULE (20 MG TOTAL) BY MOUTH DAILY., Disp: 90 capsule, Rfl: 1 .  ondansetron (ZOFRAN) 8 MG tablet, Take 1 tablet (8 mg total) by mouth 2 (two) times daily., Disp: 180 tablet, Rfl: 3 .  Potassium 99 MG TABS, Take 1 tablet by mouth., Disp: , Rfl:  .  vitamin B-12 (CYANOCOBALAMIN) 1000 MCG tablet, Take 1,000 mcg by mouth daily., Disp: , Rfl:  .  [START ON 12/25/2017] HYDROcodone-acetaminophen (NORCO/VICODIN) 5-325 MG tablet, Take 1 tablet by mouth daily as needed for moderate pain., Disp: 15 tablet, Rfl: 0 .  [START ON 10/26/2017] HYDROcodone-acetaminophen (NORCO/VICODIN) 5-325 MG tablet, Take 1 tablet by mouth daily as needed for moderate pain., Disp: 15 tablet, Rfl: 0  ROS  Constitutional: Denies any fever or chills Gastrointestinal: No reported hemesis, hematochezia, vomiting, or acute GI distress Musculoskeletal: Denies any acute onset joint swelling, redness, loss of ROM, or weakness Neurological: No reported episodes of acute onset apraxia, aphasia, dysarthria, agnosia, amnesia, paralysis, loss of coordination, or loss of consciousness  Allergies  Mr. Chaput has No Known Allergies.  [place]  Drug: Mr. Glennon  reports that he does not use drugs. Alcohol:  reports that he does not drink alcohol. Tobacco:  reports that  has never smoked. he has never used smokeless tobacco. Medical:  has a past medical history of Allergy, Anxiety, Chronic duodenal ulcer with hemorrhage (2012), Chronic neck pain, Depression, Hyperlipidemia, Hypertension, and Microscopic hematuria. Surgical: Mr. Homewood  has a past surgical history that includes eye muscle repair [phone] and 1975) and bLEEDING ULCER (2012). Family: family history includes Allergies in his brother; Cancer in his maternal grandfather and mother; Dementia in his father and paternal grandfather; Depression in his father; Diabetes in his mother; Mental illness in his father; Stroke in his paternal  grandfather.  Constitutional Exam  General appearance: Well nourished, well developed, and well hydrated. In no apparent acute distress Vitals:   10/19/17 1044  BP: 120/83  Pulse: 70  Resp: 16  Temp: 98.4 F (36.9 C)  TempSrc: Oral  SpO2: 100%  Weight: 183 lb (83 kg)  Height: _0  (1.702 m)   BMI Assessment: Estimated body mass index is 28.66 kg/m as calculated from the following:   Height as of this encounter: _1  (1.702 m).   Weight as of this encounter: 183 lb (83 kg). Psych/Mental status: Alert, oriented x 3 (person, place, & time)       Eyes: PERLA Respiratory: No evidence of acute respiratory distress  Cervical Spine Area Exam  Skin & Axial Inspection: No masses, redness, edema, swelling, or associated skin lesions Alignment: Symmetrical Functional ROM: Unrestricted ROM      Stability: No instability detected Muscle Tone/Strength: Functionally intact. No obvious neuro-muscular anomalies detected. Sensory (Neurological): Unimpaired Palpation: Complains of area being tender to palpation              Upper Extremity (UE) Exam    Side: Right upper extremity  Side: Left upper extremity  Skin & Extremity Inspection: Skin color, temperature, and hair growth are WNL. No peripheral edema or cyanosis. No masses, redness, swelling, asymmetry, or associated skin lesions. No contractures.  Skin & Extremity Inspection: Skin color, temperature, and hair growth are WNL. No peripheral edema or cyanosis. No masses, redness, swelling, asymmetry, or associated skin lesions. No contractures.  Functional ROM: Unrestricted ROM  Functional ROM: Unrestricted ROM          Muscle Tone/Strength: Functionally intact. No obvious neuro-muscular anomalies detected.  Muscle Tone/Strength: Functionally intact. No obvious neuro-muscular anomalies detected.  Sensory (Neurological): Unimpaired          Sensory (Neurological): Unimpaired          Palpation: No palpable anomalies               Palpation: No palpable anomalies              Specialized Test(s): Deferred         Specialized Test(s): Deferred           Lumbar Spine Area Exam  Skin & Axial Inspection: No masses, redness, or swelling Alignment: Symmetrical Functional ROM: Unrestricted ROM      Stability: No instability detected Muscle Tone/Strength: Functionally intact. No obvious neuro-muscular anomalies detected. Sensory (Neurological): Unimpaired Palpation: Complains of area being tender to palpation       Provocative Tests: Lumbar Hyperextension and rotation test: evaluation deferred today       Lumbar Lateral bending test: evaluation deferred today       Patrick's Maneuver: evaluation deferred today                    Gait & Posture Assessment  Ambulation: Unassisted Gait: Relatively normal for age and body habitus Posture: WNL   Lower Extremity Exam    Side: Right lower extremity  Side: Left lower extremity  Skin & Extremity Inspection: Skin color, temperature, and hair growth are WNL. No peripheral edema or cyanosis. No masses, redness, swelling, asymmetry, or associated skin lesions. No contractures.  Skin & Extremity Inspection: Skin color, temperature, and hair growth are WNL. No peripheral edema or cyanosis. No masses, redness, swelling, asymmetry, or associated skin lesions. No contractures.  Functional ROM: Unrestricted ROM          Functional ROM: Unrestricted ROM          Muscle Tone/Strength: Functionally intact. No obvious neuro-muscular anomalies detected.  Muscle Tone/Strength: Functionally intact. No obvious neuro-muscular anomalies detected.  Sensory (Neurological): Unimpaired  Sensory (Neurological): Unimpaired  Palpation: No palpable anomalies  Palpation: No palpable anomalies   Assessment  Primary Diagnosis & Pertinent Problem List: The primary encounter diagnosis was Chronic low back pain (Secondary source of pain) (Bilateral) (L>R). Diagnoses of Chronic neck pain (Primary Source of Pain)  (Bilateral) (L>R), Chronic upper back pain [facility] source of pain) (Bilateral) (L>R), Chronic low back pain (Location of Secondary source of pain) (Bilateral) (L>R), Muscle spasticity, and Chronic pain syndrome were also pertinent to this visit.  Status Diagnosis  Controlled Persistent Controlled 1. Chronic low back pain (Secondary source of pain) (Bilateral) (L>R)   2. Chronic neck pain (Primary Source of Pain) (Bilateral) (L>R)   3. Chronic upper back pain [facility] source of pain) (Bilateral) (L>R)   4. Chronic low back pain (Location of Secondary source of pain) (Bilateral) (L>R)   5. Muscle spasticity   6. Chronic pain syndrome     Problems updated and reviewed during this visit: No problems updated. Plan of Care  Pharmacotherapy (Medications Ordered): Meds ordered this encounter  Medications  . baclofen (LIORESAL) 10 MG tablet    Sig: Take 1 tablet (10 mg total) by mouth at bedtime.    Dispense:  90 tablet    Refill:  0    Do not place medication on "Automatic Refill". Fill one day early if pharmacy is closed on  scheduled refill date.    Order Specific Question:   Supervising Provider    Answer:   Milinda Pointer [phone]  . HYDROcodone-acetaminophen (NORCO/VICODIN) 5-325 MG tablet    Sig: Take 1 tablet by mouth daily as needed for moderate pain.    Dispense:  15 tablet    Refill:  0    Fill one day early if pharmacy is closed on scheduled refill date. Do not fill until2/24/2019 To last until: 01/24/2018    Order Specific Question:   Supervising Provider    Answer:   Milinda Pointer [phone]  . HYDROcodone-acetaminophen (NORCO/VICODIN) 5-325 MG tablet    Sig: Take 1 tablet by mouth daily as needed for moderate pain.    Dispense:  15 tablet    Refill:  0    Fill one day early if pharmacy is closed on scheduled refill date. Do not fill until:11/25/2017 To last until:12/25/2017    Order Specific Question:   Supervising Provider    Answer:   Milinda Pointer [phone]   . HYDROcodone-acetaminophen (NORCO/VICODIN) 5-325 MG tablet    Sig: Take 1 tablet by mouth daily as needed for moderate pain.    Dispense:  15 tablet    Refill:  0    Fill one day early if pharmacy is closed on scheduled refill date. Do not fill until:10/26/2017 To last until: 11/25/2017    Order Specific Question:   Supervising Provider    Answer:   Milinda Pointer [phone]  This SmartLink is deprecated. Use AVSMEDLIST instead to display the medication list for a patient. Medications administered today: Sallee Lange. Craddock had no medications administered during this visit. Lab-work, procedure(s), and/or referral(s): No orders of the defined types were placed in this encounter.  Imaging and/or referral(s): None  Interventional management options: Planned, scheduled, and/or pending:   Diagnostic left L4-5 interlaminar lumbar epidural steroid injection #2  Palliative left-sided cervical epidural steroid injection #2    Considering:   Diagnostic bilateral lumbar facetblock  Possible bilateral lumbar facet RFA Palliative left-sided cervical epidural steroid injection #2  Diagnostic bilateral cervical facetblock  Possible bilateral cervical facet RFA Diagnostic bilateral sacroiliac joint block Possible bilateral sacroiliac joint RFA Diagnostic trigger point injections   Palliative PRN treatment(s):   Diagnostic left L4-5 interlaminar lumbar epidural steroid injection #2  Palliative left-sided cervical epidural steroid injection #2   Provider-requested follow-up: Return in about 3 months (around 01/17/2018) for MedMgmt with Me Donella Stade Edison Pace).  Future Appointments  Date Time Provider [place]  02/24/2018 10:00 AM Kathrine Haddock, NP [facility]   Primary Care Physician: Kathrine Haddock, NP Location: [facility] Outpatient Pain Management Facility Note by: Vevelyn Francois NP Date: 10/19/2017; Time: 11:15 AM  Pain Score Disclaimer: We use the NRS-11 scale. This is a  self-reported, subjective measurement of pain severity with only modest accuracy. It is used primarily to identify changes within a particular patient. It must be understood that outpatient pain scales are significantly less accurate that those used for research, where they can be applied under ideal controlled circumstances with minimal exposure to variables. In reality, the score is likely to be a combination of pain intensity and pain affect, where pain affect describes the degree of emotional arousal or changes in action readiness caused by the sensory experience of pain. Factors such as social and work situation, setting, emotional state, anxiety levels, expectation, and prior pain experience may influence pain perception and show large inter-individual differences that may also be affected by time variables.  Patient instructions  provided during this appointment: Patient Instructions    ____________________________________________________________________________________________  Medication Rules  Applies to: All patients receiving prescriptions (written or electronic).  Pharmacy of record: Pharmacy where electronic prescriptions will be sent. If written prescriptions are taken to a different pharmacy, please inform the nursing staff. The pharmacy listed in the electronic medical record should be the one where you would like electronic prescriptions to be sent.  Prescription refills: Only during scheduled appointments. Applies to both, written and electronic prescriptions.  NOTE: The following applies primarily to controlled substances (Opioid* Pain Medications).   Patient's responsibilities: 1. Pain Pills: Bring all pain pills to every appointment (except for procedure appointments). 2. Pill Bottles: Bring pills in original pharmacy bottle. Always bring newest bottle. Bring bottle, even if empty. 3. Medication refills: You are responsible for knowing and keeping track of what medications  you need refilled. The day before your appointment, write a list of all prescriptions that need to be refilled. Bring that list to your appointment and give it to the admitting nurse. Prescriptions will be written only during appointments. If you forget a medication, it will not be "Called in", "Faxed", or "electronically sent". You will need to get another appointment to get these prescribed. 4. Prescription Accuracy: You are responsible for carefully inspecting your prescriptions before leaving our office. Have the discharge nurse carefully go over each prescription with you, before taking them home. Make sure that your name is accurately spelled, that your address is correct. Check the name and dose of your medication to make sure it is accurate. Check the number of pills, and the written instructions to make sure they are clear and accurate. Make sure that you are given enough medication to last until your next medication refill appointment. 5. Taking Medication: Take medication as prescribed. Never take more pills than instructed. Never take medication more frequently than prescribed. Taking less pills or less frequently is permitted and encouraged, when it comes to controlled substances (written prescriptions).  6. Inform other Doctors: Always inform, all of your healthcare providers, of all the medications you take. 7. Pain Medication from other Providers: You are not allowed to accept any additional pain medication from any other Doctor or Healthcare provider. There are two exceptions to this rule. (see below) In the event that you require additional pain medication, you are responsible for notifying us, as stated below. 8. Medication Agreement: You are responsible for carefully reading and following our Medication Agreement. This must be signed before receiving any prescriptions from our practice. Safely store a copy of your signed Agreement. Violations to the Agreement will result in no further  prescriptions. (Additional copies of our Medication Agreement are available upon request.) 9. Laws, Rules, & Regulations: All patients are expected to follow all Federal and [employer], Rules, [employer]. Ignorance of the Laws does not constitute a valid excuse. The use of any illegal substances is prohibited. 10. Adopted CDC guidelines & recommendations: Target dosing levels will be at or below 60 MME/day. Use of benzodiazepines** is not recommended.  Exceptions: There are only two exceptions to the rule of not receiving pain medications from other Healthcare Providers. 1. Exception #1 (Emergencies): In the event of an emergency (i.e.: accident requiring emergency care), you are allowed to receive additional pain medication. However, you are responsible for: As soon as you are able, call our office [phone], at any time of the day or night, and leave a message stating your name, the date and nature of the  emergency, and the name and dose of the medication prescribed. In the event that your call is answered by a member of our staff, make sure to document and save the date, time, and the name of the person that took your information.  2. Exception #2 (Planned Surgery): In the event that you are scheduled by another doctor or dentist to have any type of surgery or procedure, you are allowed (for a period no longer than 30 days), to receive additional pain medication, for the acute post-op pain. However, in this case, you are responsible for picking up a copy of our "Post-op Pain Management for Surgeons" handout, and giving it to your surgeon or dentist. This document is available at our office, and does not require an appointment to obtain it. Simply go to our office during business hours (Monday-Thursday from 8:00 AM to 4:00 PM) (Friday 8:00 AM to 12:00 Noon) or if you have a scheduled appointment with [place], prior to your surgery, and ask for it by name. In addition, you will need to provide [place]  with your name, name of your surgeon, type of surgery, and date of procedure or surgery.  *Opioid medications include: morphine, codeine, oxycodone, oxymorphone, hydrocodone, hydromorphone, meperidine, tramadol, tapentadol, buprenorphine, fentanyl, methadone. **Benzodiazepine medications include: diazepam (Valium), alprazolam (Xanax), clonazepam (Klonopine), lorazepam (Ativan), clorazepate (Tranxene), chlordiazepoxide (Librium), estazolam (Prosom), oxazepam (Serax), temazepam (Restoril), triazolam (Halcion)  ____________________________________________________________________________________________   BMI interpretation table: BMI level Category Range association with higher incidence of chronic pain  <18 kg/m2 Underweight   18.5-24.9 kg/m2 Ideal body weight   25-29.9 kg/m2 Overweight Increased incidence by 20%  30-34.9 kg/m2 Obese (Class I) Increased incidence by 68%  35-39.9 kg/m2 Severe obesity (Class II) Increased incidence by 136%  >40 kg/m2 Extreme obesity (Class III) Increased incidence by 254%   BMI Readings from Last 4 Encounters:  10/19/17 28.66 kg/m  09/12/17 28.51 kg/m  08/25/17 28.19 kg/m  08/22/17 28.85 kg/m   Wt Readings from Last 4 Encounters:  10/19/17 183 lb (83 kg)  09/12/17 182 lb (82.6 kg)  08/25/17 180 lb (81.6 kg)  08/22/17 180 lb 6.4 oz (81.8 kg)

## 2017-10-19 NOTE — Progress Notes (Signed)
Nursing Pain Medication Assessment:  Safety precautions to be maintained throughout the outpatient stay will include: orient to surroundings, keep bed in low position, maintain call bell within reach at all times, provide assistance with transfer out of bed and ambulation.  Medication Inspection Compliance: Pill count conducted under aseptic conditions, in front of the patient. Neither the pills nor the bottle was removed from the patient's sight at any time. Once count was completed pills were immediately returned to the patient in their original bottle.  Medication: Hydrocodone/APAP Pill/Patch Count: 1 of 15 pills remain Pill/Patch Appearance: Markings consistent with prescribed medication Bottle Appearance: Standard pharmacy container. Clearly labeled. Filled Date: 11/26 / 2018 Last Medication intake:  Yesterday

## 2017-10-19 NOTE — Patient Instructions (Addendum)
____________________________________________________________________________________________  Medication Rules  Applies to: All patients receiving prescriptions (written or electronic).  Pharmacy of record: Pharmacy where electronic prescriptions will be sent. If written prescriptions are taken to a different pharmacy, please inform the nursing staff. The pharmacy listed in the electronic medical record should be the one where you would like electronic prescriptions to be sent.  Prescription refills: Only during scheduled appointments. Applies to both, written and electronic prescriptions.  NOTE: The following applies primarily to controlled substances (Opioid* Pain Medications).   Patient's responsibilities: 1. Pain Pills: Bring all pain pills to every appointment (except for procedure appointments). 2. Pill Bottles: Bring pills in original pharmacy bottle. Always bring newest bottle. Bring bottle, even if empty. 3. Medication refills: You are responsible for knowing and keeping track of what medications you need refilled. The day before your appointment, write a list of all prescriptions that need to be refilled. Bring that list to your appointment and give it to the admitting nurse. Prescriptions will be written only during appointments. If you forget a medication, it will not be "Called in", "Faxed", or "electronically sent". You will need to get another appointment to get these prescribed. 4. Prescription Accuracy: You are responsible for carefully inspecting your prescriptions before leaving our office. Have the discharge nurse carefully go over each prescription with you, before taking them home. Make sure that your name is accurately spelled, that your address is correct. Check the name and dose of your medication to make sure it is accurate. Check the number of pills, and the written instructions to make sure they are clear and accurate. Make sure that you are given enough medication to  last until your next medication refill appointment. 5. Taking Medication: Take medication as prescribed. Never take more pills than instructed. Never take medication more frequently than prescribed. Taking less pills or less frequently is permitted and encouraged, when it comes to controlled substances (written prescriptions).  6. Inform other Doctors: Always inform, all of your healthcare providers, of all the medications you take. 7. Pain Medication from other Providers: You are not allowed to accept any additional pain medication from any other Doctor or Healthcare provider. There are two exceptions to this rule. (see below) In the event that you require additional pain medication, you are responsible for notifying us, as stated below. 8. Medication Agreement: You are responsible for carefully reading and following our Medication Agreement. This must be signed before receiving any prescriptions from our practice. Safely store a copy of your signed Agreement. Violations to the Agreement will result in no further prescriptions. (Additional copies of our Medication Agreement are available upon request.) 9. Laws, Rules, & Regulations: All patients are expected to follow all Federal and State Laws, Statutes, Rules, & Regulations. Ignorance of the Laws does not constitute a valid excuse. The use of any illegal substances is prohibited. 10. Adopted CDC guidelines & recommendations: Target dosing levels will be at or below 60 MME/day. Use of benzodiazepines** is not recommended.  Exceptions: There are only two exceptions to the rule of not receiving pain medications from other Healthcare Providers. 1. Exception #1 (Emergencies): In the event of an emergency (i.e.: accident requiring emergency care), you are allowed to receive additional pain medication. However, you are responsible for: As soon as you are able, call our office [phone], at any time of the day or night, and leave a message stating your  name, the date and nature of the emergency, and the name and dose of the medication   prescribed. In the event that your call is answered by a member of our staff, make sure to document and save the date, time, and the name of the person that took your information.  2. Exception #2 (Planned Surgery): In the event that you are scheduled by another doctor or dentist to have any type of surgery or procedure, you are allowed (for a period no longer than 30 days), to receive additional pain medication, for the acute post-op pain. However, in this case, you are responsible for picking up a copy of our "Post-op Pain Management for Surgeons" handout, and giving it to your surgeon or dentist. This document is available at our office, and does not require an appointment to obtain it. Simply go to our office during business hours (Monday-Thursday from 8:00 AM to 4:00 PM) (Friday 8:00 AM to 12:00 Noon) or if you have a scheduled appointment with [place], prior to your surgery, and ask for it by name. In addition, you will need to provide [place] with your name, name of your surgeon, type of surgery, and date of procedure or surgery.  *Opioid medications include: morphine, codeine, oxycodone, oxymorphone, hydrocodone, hydromorphone, meperidine, tramadol, tapentadol, buprenorphine, fentanyl, methadone. **Benzodiazepine medications include: diazepam (Valium), alprazolam (Xanax), clonazepam (Klonopine), lorazepam (Ativan), clorazepate (Tranxene), chlordiazepoxide (Librium), estazolam (Prosom), oxazepam (Serax), temazepam (Restoril), triazolam (Halcion)  ____________________________________________________________________________________________   BMI interpretation table: BMI level Category Range association with higher incidence of chronic pain  <18 kg/m2 Underweight   18.5-24.9 kg/m2 Ideal body weight   25-29.9 kg/m2 Overweight Increased incidence by 20%  30-34.9 kg/m2 Obese (Class I) Increased incidence by 68%  35-39.9 kg/m2  Severe obesity (Class II) Increased incidence by 136%  >40 kg/m2 Extreme obesity (Class III) Increased incidence by 254%   BMI Readings from Last 4 Encounters:  10/19/17 28.66 kg/m  09/12/17 28.51 kg/m  08/25/17 28.19 kg/m  08/22/17 28.85 kg/m   Wt Readings from Last 4 Encounters:  10/19/17 183 lb (83 kg)  09/12/17 182 lb (82.6 kg)  08/25/17 180 lb (81.6 kg)  08/22/17 180 lb 6.4 oz (81.8 kg)   Facet Joint Block The facet joints connect the bones of the spine (vertebrae). They make it possible for you to bend, twist, and make other movements with your spine. They also keep you from bending too far, twisting too far, and making other excessive movements. A facet joint block is a procedure where a numbing medicine (anesthetic) is injected into a facet joint. Often, a type of anti-inflammatory medicine called a steroid is also injected. A facet joint block may be done to diagnose neck or back pain. If the pain gets better after a facet joint block, it means the pain is probably coming from the facet joint. If the pain does not get better, it means the pain is probably not coming from the facet joint. A facet joint block may also be done to relieve neck or back pain caused by an inflamed facet joint. A facet joint block is only done to relieve pain if the pain does not improve with other methods, such as medicine, exercise programs, and physical therapy. Tell a health care provider about:  Any allergies you have.  All medicines you are taking, including vitamins, herbs, eye drops, creams, and over-the-counter medicines.  Any problems you or family members have had with anesthetic medicines.  Any blood disorders you have.  Any surgeries you have had.  Any medical conditions you have.  Whether you are pregnant or may be pregnant. What  are the risks? Generally, this is a safe procedure. However, problems may occur, including:  Bleeding.  Injury to a nerve near the injection  site.  Pain at the injection site.  Weakness or numbness in areas controlled by nerves near the injection site.  Infection.  Temporary fluid retention.  Allergic reactions to medicines or dyes.  Injury to other structures or organs near the injection site.  What happens before the procedure?  Follow instructions from your health care provider about eating or drinking restrictions.  Ask your health care provider about: ? Changing or stopping your regular medicines. This is especially important if you are taking diabetes medicines or blood thinners. ? Taking medicines such as aspirin and ibuprofen. These medicines can thin your blood. Do not take these medicines before your procedure if your health care provider instructs you not to.  Do not take any new dietary supplements or medicines without asking your health care provider first.  Plan to have someone take you home after the procedure. What happens during the procedure?  You may need to remove your clothing and dress in an open-back gown.  The procedure will be done while you are lying on an X-ray table. You will most likely be asked to lie on your stomach, but you may be asked to lie in a different position if an injection will be made in your neck.  Machines will be used to monitor your oxygen levels, heart rate, and blood pressure.  If an injection will be made in your neck, an IV tube will be inserted into one of your veins. Fluids and medicine will flow directly into your body through the IV tube.  The area over the facet joint where the injection will be made will be cleaned with soap. The surrounding skin will be covered with clean drapes.  A numbing medicine (local anesthetic) will be applied to your skin. Your skin may sting or burn for a moment.  A video X-ray machine (fluoroscopy) will be used to locate the joint. In some cases, a CT scan may be used.  A contrast dye may be injected into the facet joint area to  help locate the joint.  When the joint is located, an anesthetic will be injected into the joint through the needle.  Your health care provider will ask you whether you feel pain relief. If you do feel relief, a steroid may be injected to provide pain relief for a longer period of time. If you do not feel relief or feel only partial relief, additional injections of an anesthetic may be made in other facet joints.  The needle will be removed.  Your skin will be cleaned.  A bandage (dressing) will be applied over each injection site. The procedure may vary among health care providers and hospitals. What happens after the procedure?  You will be observed for 15-30 minutes before being allowed to go home. This information is not intended to replace advice given to you by your health care provider. Make sure you discuss any questions you have with your health care provider. Document Released: 03/09/2007 Document Revised: 11/19/2015 Document Reviewed: 07/14/2015 [employer] Interactive Patient Education  [employer].

## 2017-11-15 ENCOUNTER — Other Ambulatory Visit: Payer: Self-pay

## 2017-11-15 ENCOUNTER — Encounter: Payer: Self-pay | Admitting: Pain Medicine

## 2017-11-15 ENCOUNTER — Ambulatory Visit (HOSPITAL_BASED_OUTPATIENT_CLINIC_OR_DEPARTMENT_OTHER): Payer: 59 | Admitting: Pain Medicine

## 2017-11-15 ENCOUNTER — Ambulatory Visit
Admission: RE | Admit: 2017-11-15 | Discharge: 2017-11-15 | Disposition: A | Discharge status: Home / Self Care | Payer: 59 | Source: Ambulatory Visit | Attending: Pain Medicine | Admitting: Pain Medicine

## 2017-11-15 ENCOUNTER — Ambulatory Visit: Payer: 59 | Admitting: Pain Medicine

## 2017-11-15 VITALS — BP 147/97 | HR 63 | Temp 97.9°F | Resp 19 | Ht 67.0 in | Wt 183.0 lb

## 2017-11-15 DIAGNOSIS — M4722 Other spondylosis with radiculopathy, cervical region: Secondary | ICD-10-CM | POA: Insufficient documentation

## 2017-11-15 DIAGNOSIS — M4802 Spinal stenosis, cervical region: Secondary | ICD-10-CM

## 2017-11-15 DIAGNOSIS — M5412 Radiculopathy, cervical region: Secondary | ICD-10-CM

## 2017-11-15 DIAGNOSIS — M47812 Spondylosis without myelopathy or radiculopathy, cervical region: Secondary | ICD-10-CM

## 2017-11-15 DIAGNOSIS — Z79899 Other long term (current) drug therapy: Secondary | ICD-10-CM | POA: Diagnosis not present

## 2017-11-15 DIAGNOSIS — G8929 Other chronic pain: Secondary | ICD-10-CM | POA: Diagnosis not present

## 2017-11-15 DIAGNOSIS — Z7951 Long term (current) use of inhaled steroids: Secondary | ICD-10-CM | POA: Insufficient documentation

## 2017-11-15 DIAGNOSIS — M542 Cervicalgia: Secondary | ICD-10-CM | POA: Diagnosis present

## 2017-11-15 DIAGNOSIS — Z8711 Personal history of peptic ulcer disease: Secondary | ICD-10-CM | POA: Insufficient documentation

## 2017-11-15 DIAGNOSIS — M50121 Cervical disc disorder at C4-C5 level with radiculopathy: Secondary | ICD-10-CM | POA: Diagnosis not present

## 2017-11-15 DIAGNOSIS — Z79891 Long term (current) use of opiate analgesic: Secondary | ICD-10-CM | POA: Insufficient documentation

## 2017-11-15 DIAGNOSIS — M5030 Other cervical disc degeneration, unspecified cervical region: Secondary | ICD-10-CM

## 2017-11-15 MED ORDER — DEXAMETHASONE SODIUM PHOSPHATE 10 MG/ML IJ SOLN
10.0000 mg | Freq: Once | INTRAMUSCULAR | Status: AC
  Administered 2017-11-15: 10 mg
  Filled 2017-11-15: qty 1

## 2017-11-15 MED ORDER — SODIUM CHLORIDE 0.9% FLUSH
1.0000 mL | Freq: Once | INTRAVENOUS | Status: AC
  Administered 2017-11-15: 10 mL

## 2017-11-15 MED ORDER — IOPAMIDOL (ISOVUE-M 200) INJECTION 41%
10.0000 mL | Freq: Once | INTRAMUSCULAR | Status: AC
  Administered 2017-11-15: 10 mL via EPIDURAL
  Filled 2017-11-15: qty 10

## 2017-11-15 MED ORDER — LIDOCAINE HCL 2 % IJ SOLN
10.0000 mL | Freq: Once | INTRAMUSCULAR | Status: AC
  Administered 2017-11-15: 400 mg

## 2017-11-15 MED ORDER — SODIUM CHLORIDE 0.9 % IJ SOLN
INTRAMUSCULAR | Status: AC
  Filled 2017-11-15: qty 10

## 2017-11-15 MED ORDER — ROPIVACAINE HCL 2 MG/ML IJ SOLN
1.0000 mL | Freq: Once | INTRAMUSCULAR | Status: AC
  Administered 2017-11-15: 10 mL via EPIDURAL
  Filled 2017-11-15: qty 10

## 2017-11-15 NOTE — Patient Instructions (Signed)

## 2017-11-15 NOTE — Progress Notes (Signed)
Patient's Name: Jose Washington  MRN: [id number]  Referring Provider: Vevelyn Francois, NP  DOB: 27-Oct-1966  PCP: Kathrine Haddock, NP  DOS: 11/15/2017  Note by: Gaspar Cola, MD  Service setting: Ambulatory outpatient  Specialty: Interventional Pain Management  Patient type: Established  Location: [facility] (AMB) Pain Management Facility  Visit type: Interventional Procedure   Primary Reason for Visit: Interventional Pain Management Treatment. CC: Neck Pain (mid)  Procedure:  Anesthesia, Analgesia, Anxiolysis:  Type: Diagnostic, Inter-Laminar, Epidural Steroid Injection          Region: Posterior Cervico-thoracic Region Level: C7-T1 Laterality: Left-Sided Paramedial  Type: Local Anesthesia Local Anesthetic: Lidocaine 1% Route: Infiltration ([place]/IM) IV Access: Declined Sedation: Declined  Indication(s): Analgesia           Indications: 1. Cervical radiculitis (Bilateral) (L>R)   2. Chronic neck pain (Primary Source of Pain) (Bilateral) (L>R)   3. Cervical spondylosis   4. Cervical foraminal stenosis (Bilateral: C5-6 & C6-7, Left: C4-5 & C7-T1)   5. Cervical DDD (C4-5, C5-6, C6-7 and C7-T1)   6. Chronic neck pain   7. Cervical radiculitis   8. Spondylosis of cervical region without myelopathy or radiculopathy   9. Foraminal stenosis of cervical region   10. DDD (degenerative disc disease), cervical    Pain Score: Pre-procedure: 1 /10 Post-procedure: 1 /10  Pre-op Assessment:  Jose Washington is a 52 y.o. (year old), male patient, seen today for interventional treatment. He  has a past surgical history that includes eye muscle repair (1972 and 1975) and bLEEDING ULCER (2012). Jose Washington has a current medication list which includes the following prescription(s): acetaminophen, atenolol, atorvastatin, baclofen, buspirone, calcium carbonate, vitamin d3, chondroitin sulfate, desloratadine-pseudoephedrine, diphenhydramine hcl, duloxetine, fenofibrate, fluticasone, glucosamine-chondroit-vit  c-mn, hydrocodone-acetaminophen, hydrocodone-acetaminophen, hydrocodone-acetaminophen, ibuprofen, melatonin, methylsulfonylmethane, multivitamin, omeprazole, ondansetron, potassium, vitamin b-12, and vitamin d (ergocalciferol). His primarily concern today is the Neck Pain (mid)  Initial Vital Signs: Blood pressure 126/88, pulse 63, temperature 97.9 F (36.6 C), height 5\' 7"  (1.702 m), weight 183 lb (83 kg), SpO2 99 %. BMI: Estimated body mass index is 28.66 kg/m as calculated from the following:   Height as of this encounter: 5\' 7"  (1.702 m).   Weight as of this encounter: 183 lb (83 kg).  Risk Assessment: Allergies: Reviewed. He has No Known Allergies.  Allergy Precautions: None required Coagulopathies: Reviewed. None identified.  Blood-thinner therapy: None at this time Active Infection(s): Reviewed. None identified. Jose Washington is afebrile  Site Confirmation: Jose Washington was asked to confirm the procedure and laterality before marking the site Procedure checklist: Completed Consent: Before the procedure and under the influence of no sedative(s), amnesic(s), or anxiolytics, the patient was informed of the treatment options, risks and possible complications. To fulfill our ethical and legal obligations, as recommended by the American Medical Association's Code of Ethics, I have informed the patient of my clinical impression; the nature and purpose of the treatment or procedure; the risks, benefits, and possible complications of the intervention; the alternatives, including doing nothing; the risk(s) and benefit(s) of the alternative treatment(s) or procedure(s); and the risk(s) and benefit(s) of doing nothing. The patient was provided information about the general risks and possible complications associated with the procedure. These may include, but are not limited to: failure to achieve desired goals, infection, bleeding, organ or nerve damage, allergic reactions, paralysis, and death. In  addition, the patient was informed of those risks and complications associated to Spine-related procedures, such as failure to decrease pain; infection (i.e.: Meningitis, epidural  or intraspinal abscess); bleeding (i.e.: epidural hematoma, subarachnoid hemorrhage, or any other type of intraspinal or peri-dural bleeding); organ or nerve damage (i.e.: Any type of peripheral nerve, nerve root, or spinal cord injury) with subsequent damage to sensory, motor, and/or autonomic systems, resulting in permanent pain, numbness, and/or weakness of one or several areas of the body; allergic reactions; (i.e.: anaphylactic reaction); and/or death. Furthermore, the patient was informed of those risks and complications associated with the medications. These include, but are not limited to: allergic reactions (i.e.: anaphylactic or anaphylactoid reaction(s)); adrenal axis suppression; blood sugar elevation that in diabetics may result in ketoacidosis or comma; water retention that in patients with history of congestive heart failure may result in shortness of breath, pulmonary edema, and decompensation with resultant heart failure; weight gain; swelling or edema; medication-induced neural toxicity; particulate matter embolism and blood vessel occlusion with resultant organ, and/or nervous system infarction; and/or aseptic necrosis of one or more joints. Finally, the patient was informed that Medicine is not an exact science; therefore, there is also the possibility of unforeseen or unpredictable risks and/or possible complications that may result in a catastrophic outcome. The patient indicated having understood very clearly. We have given the patient no guarantees and we have made no promises. Enough time was given to the patient to ask questions, all of which were answered to the patient's satisfaction. Jose Washington has indicated that he wanted to continue with the procedure. Attestation: I, the ordering provider, attest that I  have discussed with the patient the benefits, risks, side-effects, alternatives, likelihood of achieving goals, and potential problems during recovery for the procedure that I have provided informed consent. Date: 11/15/2017; Time: 2:22 PM  Pre-Procedure Preparation:  Monitoring: As per clinic protocol. Respiration, ETCO2, SpO2, BP, heart rate and rhythm monitor placed and checked for adequate function Safety Precautions: Patient was assessed for positional comfort and pressure points before starting the procedure. Time-out: I initiated and conducted the "Time-out" before starting the procedure, as per protocol. The patient was asked to participate by confirming the accuracy of the "Time Out" information. Verification of the correct person, site, and procedure were performed and confirmed by me, the nursing staff, and the patient. "Time-out" conducted as per Joint Commission's Universal Protocol (UP.01.01.01). "Time-out" Date & Time: 11/15/2017; 1505 hrs.  Description of Procedure Process:   Position: Prone with head of the table was raised to facilitate breathing. Target Area: For Epidural Steroid injections the target is the interlaminar space, initially targeting the lower border of the superior vertebral body lamina. Approach: Paramedial approach. Area Prepped: Entire PosteriorCervical Region Prepping solution: ChloraPrep (2% chlorhexidine gluconate and 70% isopropyl alcohol) Safety Precautions: Aspiration looking for blood return was conducted prior to all injections. At no point did we inject any substances, as a needle was being advanced. No attempts were made at seeking any paresthesias. Safe injection practices and needle disposal techniques used. Medications properly checked for expiration dates. SDV (single dose vial) medications used. Description of the Procedure: Protocol guidelines were followed. The procedure needle was introduced through the skin, ipsilateral to the reported pain, and  advanced to the target area. Bone was contacted and the needle walked caudad, until the lamina was cleared. The epidural space was identified using "loss-of-resistance technique" with 2-3 ml of PF-NaCl (0.9% NSS), in a 5cc LOR glass syringe. Vitals:   11/15/17 1359 11/15/17 1504 11/15/17 1509 11/15/17 1513  BP: 126/88 (!) 161/103 (!) 136/109 (!) 147/97  Pulse: 63     Resp:  20  17 19  Temp: 97.9 F (36.6 C)     SpO2: 99% 98% 100% 100%  Weight: 183 lb (83 kg)     Height: 5\' 7"  (1.702 m)       Start Time: 1406 hrs. End Time:   hrs. Materials:  Needle(s) Type: Epidural needle Gauge: 17G Length: 3.5-in Medication(s): We administered iopamidol, lidocaine, sodium chloride flush, ropivacaine (PF) 2 mg/mL (0.2%), and dexamethasone. Please see chart orders for dosing details.  Imaging Guidance (Spinal):  Type of Imaging Technique: Fluoroscopy Guidance (Spinal) Indication(s): Assistance in needle guidance and placement for procedures requiring needle placement in or near specific anatomical locations not easily accessible without such assistance. Exposure Time: Please see nurses notes. Contrast: Before injecting any contrast, we confirmed that the patient did not have an allergy to iodine, shellfish, or radiological contrast. Once satisfactory needle placement was completed at the desired level, radiological contrast was injected. Contrast injected under live fluoroscopy. No contrast complications. See chart for type and volume of contrast used. Fluoroscopic Guidance: I was personally present during the use of fluoroscopy. "Tunnel Vision Technique" used to obtain the best possible view of the target area. Parallax error corrected before commencing the procedure. "Direction-depth-direction" technique used to introduce the needle under continuous pulsed fluoroscopy. Once target was reached, antero-posterior, oblique, and lateral fluoroscopic projection used confirm needle placement in all planes. Images  permanently stored in EMR. Interpretation: I personally interpreted the imaging intraoperatively. Adequate needle placement confirmed in multiple planes. Appropriate spread of contrast into desired area was observed. No evidence of afferent or efferent intravascular uptake. No intrathecal or subarachnoid spread observed. Permanent images saved into the patient's record.  Antibiotic Prophylaxis:  Indication(s): None identified Antibiotic given: None  Post-operative Assessment:  EBL: None Complications: No immediate post-treatment complications observed by team, or reported by patient. Note: The patient tolerated the entire procedure well. A repeat set of vitals were taken after the procedure and the patient was kept under observation following institutional policy, for this type of procedure. Post-procedural neurological assessment was performed, showing return to baseline, prior to discharge. The patient was provided with post-procedure discharge instructions, including a section on how to identify potential problems. Should any problems arise concerning this procedure, the patient was given instructions to immediately contact us, at any time, without hesitation. In any case, we plan to contact the patient by telephone for a follow-up status report regarding this interventional procedure. Comments:  No additional relevant information.  Plan of Care   Imaging Orders     DG C-Arm 1-60 Min-No Report  Procedure Orders     Cervical Epidural Injection  Medications ordered for procedure: Meds ordered this encounter  Medications  . iopamidol (ISOVUE-M) 41 % intrathecal injection 10 mL    Must be Myelogram-compatible. If not available, you may substitute with a water-soluble, non-ionic, hypoallergenic, myelogram-compatible radiological contrast medium.  Marland Kitchen lidocaine (XYLOCAINE) 2 % (with pres) injection 200 mg  . sodium chloride flush (NS) 0.9 % injection 1 mL  . ropivacaine (PF) 2 mg/mL (0.2%)  (NAROPIN) injection 1 mL  . dexamethasone (DECADRON) injection 10 mg   Medications administered: We administered iopamidol, lidocaine, sodium chloride flush, ropivacaine (PF) 2 mg/mL (0.2%), and dexamethasone.  See the medical record for exact dosing, route, and time of administration.  New Prescriptions   No medications on file   Disposition: Discharge home  Discharge Date & Time: 11/15/2017; 1518 hrs.   Physician-requested Follow-up: Return for post-procedure eval (2 wks), w/ Dr. Dossie Arbour.  Future Appointments  Date Time Provider [place]  11/30/2017  1:30 PM Milinda Pointer, MD [facility] None  02/24/2018 10:00 AM Kathrine Haddock, NP [facility] [place]   Primary Care Physician: Kathrine Haddock, NP Location: [facility] Note by: Gaspar Cola, MD Date: 11/15/2017; Time: 3:39 PM  Disclaimer:  Medicine is not an Chief Strategy Officer. The only guarantee in medicine is that nothing is guaranteed. It is important to note that the decision to proceed with this intervention was based on the information collected from the patient. The Data and conclusions were drawn from the patient's questionnaire, the interview, and the physical examination. Because the information was provided in large part by the patient, it cannot be guaranteed that it has not been purposely or unconsciously manipulated. Every effort has been made to obtain as much relevant data as possible for this evaluation. It is important to note that the conclusions that lead to this procedure are derived in large part from the available data. Always take into account that the treatment will also be dependent on availability of resources and existing treatment guidelines, considered by other Pain Management Practitioners as being common knowledge and practice, at the time of the intervention. For Medico-Legal purposes, it is also important to point out that variation in procedural techniques and pharmacological  choices are the acceptable norm. The indications, contraindications, technique, and results of the above procedure should only be interpreted and judged by a Board-Certified Interventional Pain Specialist with extensive familiarity and expertise in the same exact procedure and technique.

## 2017-11-16 ENCOUNTER — Telehealth: Payer: Self-pay | Admitting: *Deleted

## 2017-11-16 NOTE — Telephone Encounter (Signed)
LVM

## 2017-11-17 ENCOUNTER — Ambulatory Visit: Payer: 59 | Admitting: Pain Medicine

## 2017-11-30 ENCOUNTER — Encounter: Payer: Self-pay | Admitting: Pain Medicine

## 2017-11-30 ENCOUNTER — Ambulatory Visit: Payer: 59 | Attending: Pain Medicine | Admitting: Pain Medicine

## 2017-11-30 VITALS — BP 131/83 | HR 68 | Temp 98.1°F | Resp 16 | Ht 67.0 in | Wt 182.0 lb

## 2017-11-30 DIAGNOSIS — M5030 Other cervical disc degeneration, unspecified cervical region: Secondary | ICD-10-CM | POA: Diagnosis not present

## 2017-11-30 DIAGNOSIS — M4310 Spondylolisthesis, site unspecified: Secondary | ICD-10-CM

## 2017-11-30 DIAGNOSIS — M533 Sacrococcygeal disorders, not elsewhere classified: Secondary | ICD-10-CM | POA: Diagnosis not present

## 2017-11-30 DIAGNOSIS — Z8711 Personal history of peptic ulcer disease: Secondary | ICD-10-CM | POA: Diagnosis not present

## 2017-11-30 DIAGNOSIS — K2970 Gastritis, unspecified, without bleeding: Secondary | ICD-10-CM | POA: Insufficient documentation

## 2017-11-30 DIAGNOSIS — M542 Cervicalgia: Secondary | ICD-10-CM | POA: Diagnosis present

## 2017-11-30 DIAGNOSIS — M25512 Pain in left shoulder: Secondary | ICD-10-CM | POA: Insufficient documentation

## 2017-11-30 DIAGNOSIS — M79605 Pain in left leg: Secondary | ICD-10-CM | POA: Insufficient documentation

## 2017-11-30 DIAGNOSIS — M25511 Pain in right shoulder: Secondary | ICD-10-CM

## 2017-11-30 DIAGNOSIS — M62838 Other muscle spasm: Secondary | ICD-10-CM | POA: Diagnosis not present

## 2017-11-30 DIAGNOSIS — E559 Vitamin D deficiency, unspecified: Secondary | ICD-10-CM | POA: Diagnosis not present

## 2017-11-30 DIAGNOSIS — Z79899 Other long term (current) drug therapy: Secondary | ICD-10-CM | POA: Diagnosis not present

## 2017-11-30 DIAGNOSIS — M50323 Other cervical disc degeneration at C6-C7 level: Secondary | ICD-10-CM | POA: Insufficient documentation

## 2017-11-30 DIAGNOSIS — G894 Chronic pain syndrome: Secondary | ICD-10-CM | POA: Diagnosis not present

## 2017-11-30 DIAGNOSIS — M4803 Spinal stenosis, cervicothoracic region: Secondary | ICD-10-CM | POA: Diagnosis not present

## 2017-11-30 DIAGNOSIS — M47812 Spondylosis without myelopathy or radiculopathy, cervical region: Secondary | ICD-10-CM

## 2017-11-30 DIAGNOSIS — I10 Essential (primary) hypertension: Secondary | ICD-10-CM | POA: Insufficient documentation

## 2017-11-30 DIAGNOSIS — M4316 Spondylolisthesis, lumbar region: Secondary | ICD-10-CM | POA: Insufficient documentation

## 2017-11-30 DIAGNOSIS — Z79891 Long term (current) use of opiate analgesic: Secondary | ICD-10-CM | POA: Insufficient documentation

## 2017-11-30 DIAGNOSIS — E785 Hyperlipidemia, unspecified: Secondary | ICD-10-CM | POA: Diagnosis not present

## 2017-11-30 DIAGNOSIS — M25562 Pain in left knee: Secondary | ICD-10-CM | POA: Diagnosis not present

## 2017-11-30 DIAGNOSIS — F329 Major depressive disorder, single episode, unspecified: Secondary | ICD-10-CM | POA: Insufficient documentation

## 2017-11-30 DIAGNOSIS — M4802 Spinal stenosis, cervical region: Secondary | ICD-10-CM

## 2017-11-30 DIAGNOSIS — M5412 Radiculopathy, cervical region: Secondary | ICD-10-CM | POA: Insufficient documentation

## 2017-11-30 DIAGNOSIS — G5681 Other specified mononeuropathies of right upper limb: Secondary | ICD-10-CM | POA: Insufficient documentation

## 2017-11-30 DIAGNOSIS — G8929 Other chronic pain: Secondary | ICD-10-CM | POA: Insufficient documentation

## 2017-11-30 DIAGNOSIS — M9981 Other biomechanical lesions of cervical region: Secondary | ICD-10-CM

## 2017-11-30 NOTE — Progress Notes (Signed)
Patient's Name: Jose Washington  MRN: [id number]  Referring Provider: Kathrine Haddock, NP  DOB: Oct 16, 1966  PCP: Kathrine Haddock, NP  DOS: 11/30/2017  Note by: Gaspar Cola, MD  Service setting: Ambulatory outpatient  Specialty: Interventional Pain Management  Location: [facility] (AMB) Pain Management Facility    Patient type: Established   Primary Reason(s) for Visit: Encounter for post-procedure evaluation of chronic illness with mild to moderate exacerbation CC: Neck Pain (left treated at procedure/fup today.  right is painful)  HPI  Jose Washington is a 52 y.o. year old, male patient, who comes today for a post-procedure evaluation. He has Chronic neck pain (Primary Source of Pain) (Bilateral) (L>R); Hyperlipidemia; Essential hypertension, benign; Gastritis; Chronic pain syndrome; Chronic low back pain (Secondary source of pain) (Bilateral) (L>R); Chronic upper back pain [facility] source of pain) (Bilateral) (L>R); Chronic shoulder pain (Bilateral) (L>R); Osteoarthritis of AC (acromioclavicular) joint (Right); Chronic sacroiliac joint pain (Bilateral) (L>R); Long term current use of opiate analgesic; Long term prescription opiate use; Opiate use (5 MME/Day); Muscle spasticity; Cervical DDD (C4-5, C5-6, C6-7 and C7-T1); Cervical foraminal stenosis (Bilateral: C5-6 & C6-7, Left: C4-5 & C7-T1); Cervical radiculitis (Bilateral) (L>R); Vitamin D insufficiency; Cervical facet syndrome (HCC) (L); Cervical spondylosis; Musculoskeletal neck pain (trapezius) (Left); Depression, major, single episode, severe ([place]); Chronic knee pain (Left); Chronic lower extremity pain (Left); Grade 1 Retrolisthesis of L3 over L4; Lumbar facet arthropathy (Bilateral); Lumbar facet osteoarthritis; Lumbar facet syndrome (Bilateral) (L>R); Lumbar spondylosis; Chronic right shoulder pain; and Suprascapular neuropathy, right on their problem list. His primarily concern today is the Neck Pain (left treated at procedure/fup today.  right is  painful)  Pain Assessment: Location: Left(right side hurting currently.  saw chiropractor on yesterday. ) Neck Radiating: na Onset: More than a month ago Duration: Chronic pain Quality: Dull, Aching, Constant(constant until he stretches the area) Severity: 1 /10 (self-reported pain score)  Note: Reported level is compatible with observation.                               Effect on ADL: difficulty falling asleep at night.  work is fine but has to be careful or guarded when lifting.  Timing: Intermittent Modifying factors: chiropractic manipulation, tylenol, ibuprofen, pain meds.  TENS unit   Jose Washington comes in today for post-procedure evaluation after the treatment done on 11/15/2017.  Further details on both, my assessment(s), as well as the proposed treatment plan, please see below.  Post-Procedure Assessment  11/15/2017 Procedure: Diagnostic left-sided cervical epidural steroid injection #2 under fluoroscopic guidance, no sedation Pre-procedure pain score:  1/10 Post-procedure pain score: 1/10 No initial benefit, possible due to rapid discharge after no sedation procedure, without enough time to allow full onset of block. Influential Factors: BMI: 28.51 kg/m Intra-procedural challenges: None observed.         Assessment challenges: None detected.              Reported side-effects: None.        Post-procedural adverse reactions or complications: None reported         Sedation: No sedation used. When no sedatives are used, the analgesic levels obtained are directly associated to the effectiveness of the local anesthetics. However, when sedation is provided, the level of analgesia obtained during the initial 1 hour following the intervention, is believed to be the result of a combination of factors. These factors may include, but are not limited to: 1. The effectiveness  of the local anesthetics used. 2. The effects of the analgesic(s) and/or anxiolytic(s) used. 3. The degree of  discomfort experienced by the patient at the time of the procedure. 4. The patients ability and reliability in recalling and recording the events. 5. The presence and influence of possible secondary gains and/or psychosocial factors. Reported result: Relief experienced during the 1st hour after the procedure: 100 % (Ultra-Short Term Relief)            Interpretative annotation: Clinically appropriate result. Analgesia during this period is likely to be Local Anesthetic and/or IV Sedative (Analgesic/Anxiolytic) related.          Effects of local anesthetic: The analgesic effects attained during this period are directly associated to the localized infiltration of local anesthetics and therefore cary significant diagnostic value as to the etiological location, or anatomical origin, of the pain. Expected duration of relief is directly dependent on the pharmacodynamics of the local anesthetic used. Long-acting (4-6 hours) anesthetics used.  Reported result: Relief during the next 4 to 6 hour after the procedure: 100 % (Short-Term Relief)            Interpretative annotation: Clinically appropriate result. Analgesia during this period is likely to be Local Anesthetic-related.          Long-term benefit: Defined as the period of time past the expected duration of local anesthetics (1 hour for short-acting and 4-6 hours for long-acting). With the possible exception of prolonged sympathetic blockade from the local anesthetics, benefits during this period are typically attributed to, or associated with, other factors such as analgesic sensory neuropraxia, antiinflammatory effects, or beneficial biochemical changes provided by agents other than the local anesthetics.  Reported result: Extended relief following procedure: 100 % (Long-Term Relief) Jose Washington reports that both, extremity and the axial pain improved with the treatment. Interpretative annotation: Clinically appropriate result. Good relief. No permanent  benefit expected. Inflammation plays a part in the etiology to the pain.          Current benefits: Defined as reported results that persistent at this point in time.   Analgesia: 100 % Mr. Rostad reports that both, extremity and the axial pain improved with the treatment. Function: Somewhat improved ROM: Somewhat improved Interpretative annotation: Ongoing benefit. No permanent benefit expected. Effective diagnostic intervention.          Interpretation: Results would suggest a successful diagnostic intervention.                  Plan:  Set up procedure as a PRN palliative treatment option for this patient.        Laboratory Chemistry  Inflammation Markers (CRP: Acute Phase) (ESR: Chronic Phase) Lab Results  Component Value Date   CRP 1.4 (H) 11/12/2016   ESRSEDRATE 3 11/12/2016                 Rheumatology Markers Lab Results  Component Value Date   LABURIC 5.6 07/30/2015                Renal Function Markers Lab Results  Component Value Date   BUN 14 02/21/2017   CREATININE 1.15 02/21/2017   GFRAA 85 02/21/2017   GFRNONAA 74 02/21/2017                 Hepatic Function Markers Lab Results  Component Value Date   AST 35 02/21/2017   ALT 53 (H) 02/21/2017   ALBUMIN 4.9 02/21/2017   ALKPHOS 56 02/21/2017  Electrolytes Lab Results  Component Value Date   NA 146 (H) 02/21/2017   K 4.7 02/21/2017   CL 106 02/21/2017   CALCIUM 9.8 02/21/2017   MG 2.0 11/12/2016                 Neuropathy Markers Lab Results  Component Value Date   VITAMINB12 820 11/12/2016                 Bone Pathology Markers Lab Results  Component Value Date   25OHVITD1 23 (L) 11/12/2016   25OHVITD2 <1.0 11/12/2016   25OHVITD3 23 11/12/2016                 Coagulation Parameters Lab Results  Component Value Date   PLT 389 (H) 02/21/2017                 Cardiovascular Markers Lab Results  Component Value Date   HGB 15.4 02/21/2017   HCT 44.5 02/21/2017                  Note: Lab results reviewed.  Recent Diagnostic Imaging Results  DG C-Arm 1-60 Min-No Report Fluoroscopy was utilized by the requesting physician.  No radiographic  interpretation.   Complexity Note: Imaging results reviewed. Results shared with Mr. Honea, using Layman's terms.                        Meds   Current Outpatient Medications:  .  acetaminophen (TYLENOL) 500 MG tablet, Take 500 mg by mouth every 6 (six) hours as needed., Disp: , Rfl:  .  atenolol (TENORMIN) 50 MG tablet, Take 1 tablet (50 mg total) by mouth daily., Disp: 90 tablet, Rfl: 1 .  atorvastatin (LIPITOR) 40 MG tablet, Take 1 tablet (40 mg total) by mouth daily., Disp: 90 tablet, Rfl: 1 .  baclofen (LIORESAL) 10 MG tablet, Take 1 tablet (10 mg total) by mouth at bedtime., Disp: 90 tablet, Rfl: 0 .  busPIRone (BUSPAR) 30 MG tablet, Take 1 tablet (30 mg total) by mouth 2 (two) times daily., Disp: 180 tablet, Rfl: 1 .  Calcium Carbonate (CALCIUM 600 PO), Take 600 mg by mouth daily., Disp: , Rfl:  .  Cholecalciferol (VITAMIN D3) 2000 units TABS, Take by mouth daily., Disp: , Rfl:  .  Chondroitin Sulfate 400 MG CAPS, Take by mouth 2 (two) times daily., Disp: , Rfl:  .  desloratadine-pseudoephedrine (CLARINEX-D 12-HOUR) 2.5-120 MG 12 hr tablet, Take 1 tablet by mouth as needed., Disp: , Rfl:  .  DiphenhydrAMINE HCl (DIPHEDRYL ALLERGY PO), Take 25 mg by mouth daily., Disp: , Rfl:  .  DULoxetine (CYMBALTA) 30 MG capsule, Take 1 capsule (30 mg total) by mouth 2 (two) times daily., Disp: 180 capsule, Rfl: 1 .  fenofibrate 160 MG tablet, Take 1 tablet (160 mg total) by mouth daily., Disp: 90 tablet, Rfl: 1 .  fluticasone (FLONASE) 50 MCG/ACT nasal spray, Place 2 sprays into both nostrils 2 (two) times daily., Disp: , Rfl:  .  Glucosamine-Chondroit-Vit C-Mn (GLUCOSAMINE 1500 COMPLEX PO), Take by mouth 2 (two) times daily., Disp: , Rfl:  .  [START ON 12/25/2017] HYDROcodone-acetaminophen (NORCO/VICODIN) 5-325 MG tablet,  Take 1 tablet by mouth daily as needed for moderate pain., Disp: 15 tablet, Rfl: 0 .  HYDROcodone-acetaminophen (NORCO/VICODIN) 5-325 MG tablet, Take 1 tablet by mouth daily as needed for moderate pain., Disp: 15 tablet, Rfl: 0 .  ibuprofen (ADVIL,MOTRIN) 200 MG tablet, Take 200 mg  by mouth 3 (three) times a week., Disp: , Rfl:  .  Melatonin 5 MG TABS, Take by mouth at bedtime. , Disp: , Rfl:  .  Methylsulfonylmethane (MSM PO), Take by mouth 2 (two) times daily., Disp: , Rfl:  .  Multiple Vitamin (MULTIVITAMIN) tablet, Take 1 tablet by mouth daily., Disp: , Rfl:  .  omeprazole (PRILOSEC) 20 MG capsule, TAKE 1 CAPSULE (20 MG TOTAL) BY MOUTH DAILY., Disp: 90 capsule, Rfl: 1 .  ondansetron (ZOFRAN) 8 MG tablet, Take 1 tablet (8 mg total) by mouth 2 (two) times daily., Disp: 180 tablet, Rfl: 3 .  Potassium 99 MG TABS, Take 1 tablet by mouth., Disp: , Rfl:  .  vitamin B-12 (CYANOCOBALAMIN) 1000 MCG tablet, Take 1,000 mcg by mouth daily., Disp: , Rfl:  .  Vitamin D, Ergocalciferol, (DRISDOL) 50000 units CAPS capsule, Take 2,000 Units by mouth daily., Disp: , Rfl:  .  HYDROcodone-acetaminophen (NORCO/VICODIN) 5-325 MG tablet, Take 1 tablet by mouth daily as needed for moderate pain., Disp: 15 tablet, Rfl: 0  ROS  Constitutional: Denies any fever or chills Gastrointestinal: No reported hemesis, hematochezia, vomiting, or acute GI distress Musculoskeletal: Denies any acute onset joint swelling, redness, loss of ROM, or weakness Neurological: No reported episodes of acute onset apraxia, aphasia, dysarthria, agnosia, amnesia, paralysis, loss of coordination, or loss of consciousness  Allergies  Mr. Calzadilla has No Known Allergies.  [place]  Drug: Mr. Mcmanaway  reports that he does not use drugs. Alcohol:  reports that he does not drink alcohol. Tobacco:  reports that  has never smoked. he has never used smokeless tobacco. Medical:  has a past medical history of Allergy, Anxiety, Chronic duodenal ulcer with  hemorrhage (2012), Chronic neck pain, Depression, Hyperlipidemia, Hypertension, and Microscopic hematuria. Surgical: Mr. Herter  has a past surgical history that includes eye muscle repair [phone] and 1975) and bLEEDING ULCER (2012). Family: family history includes Allergies in his brother; Cancer in his maternal grandfather and mother; Dementia in his father and paternal grandfather; Depression in his father; Diabetes in his mother; Mental illness in his father; Stroke in his paternal grandfather.  Constitutional Exam  General appearance: Well nourished, well developed, and well hydrated. In no apparent acute distress Vitals:   11/30/17 1344  BP: 131/83  Pulse: 68  Resp: 16  Temp: 98.1 F (36.7 C)  TempSrc: Oral  SpO2: 99%  Weight: 182 lb (82.6 kg)  Height: _0  (1.702 m)   BMI Assessment: Estimated body mass index is 28.51 kg/m as calculated from the following:   Height as of this encounter: _1  (1.702 m).   Weight as of this encounter: 182 lb (82.6 kg).  BMI interpretation table: BMI level Category Range association with higher incidence of chronic pain  <18 kg/m2 Underweight   18.5-24.9 kg/m2 Ideal body weight   25-29.9 kg/m2 Overweight Increased incidence by 20%  30-34.9 kg/m2 Obese (Class I) Increased incidence by 68%  35-39.9 kg/m2 Severe obesity (Class II) Increased incidence by 136%  >40 kg/m2 Extreme obesity (Class III) Increased incidence by 254%   BMI Readings from Last 4 Encounters:  11/30/17 28.51 kg/m  11/15/17 28.66 kg/m  10/19/17 28.66 kg/m  09/12/17 28.51 kg/m   Wt Readings from Last 4 Encounters:  11/30/17 182 lb (82.6 kg)  11/15/17 183 lb (83 kg)  10/19/17 183 lb (83 kg)  09/12/17 182 lb (82.6 kg)  Psych/Mental status: Alert, oriented x 3 (person, place, & time)       Eyes:  PERLA Respiratory: No evidence of acute respiratory distress  Cervical Spine Area Exam  Skin & Axial Inspection: No masses, redness, edema, swelling, or associated skin  lesions Alignment: Symmetrical Functional ROM: Improved after treatment      Stability: No instability detected Muscle Tone/Strength: Functionally intact. No obvious neuro-muscular anomalies detected. Sensory (Neurological): Unimpaired Palpation: No palpable anomalies              Upper Extremity (UE) Exam    Side: Right upper extremity  Side: Left upper extremity  Skin & Extremity Inspection: Skin color, temperature, and hair growth are WNL. No peripheral edema or cyanosis. No masses, redness, swelling, asymmetry, or associated skin lesions. No contractures.  Skin & Extremity Inspection: Skin color, temperature, and hair growth are WNL. No peripheral edema or cyanosis. No masses, redness, swelling, asymmetry, or associated skin lesions. No contractures.  Functional ROM: Unrestricted ROM          Functional ROM: Unrestricted ROM          Muscle Tone/Strength: Functionally intact. No obvious neuro-muscular anomalies detected.  Muscle Tone/Strength: Functionally intact. No obvious neuro-muscular anomalies detected.  Sensory (Neurological): Unimpaired          Sensory (Neurological): Unimpaired          Palpation: No palpable anomalies              Palpation: No palpable anomalies              Specialized Test(s): Deferred         Specialized Test(s): Deferred          Thoracic Spine Area Exam  Skin & Axial Inspection: No masses, redness, or swelling Alignment: Symmetrical Functional ROM: Unrestricted ROM Stability: No instability detected Muscle Tone/Strength: Functionally intact. No obvious neuro-muscular anomalies detected. Sensory (Neurological): Unimpaired Muscle strength & Tone: No palpable anomalies  Lumbar Spine Area Exam  Skin & Axial Inspection: No masses, redness, or swelling Alignment: Symmetrical Functional ROM: Unrestricted ROM      Stability: No instability detected Muscle Tone/Strength: Functionally intact. No obvious neuro-muscular anomalies detected. Sensory  (Neurological): Unimpaired Palpation: No palpable anomalies       Provocative Tests: Lumbar Hyperextension and rotation test: evaluation deferred today       Lumbar Lateral bending test: evaluation deferred today       Patrick's Maneuver: evaluation deferred today                    Gait & Posture Assessment  Ambulation: Unassisted Gait: Relatively normal for age and body habitus Posture: WNL   Lower Extremity Exam    Side: Right lower extremity  Side: Left lower extremity  Skin & Extremity Inspection: Skin color, temperature, and hair growth are WNL. No peripheral edema or cyanosis. No masses, redness, swelling, asymmetry, or associated skin lesions. No contractures.  Skin & Extremity Inspection: Skin color, temperature, and hair growth are WNL. No peripheral edema or cyanosis. No masses, redness, swelling, asymmetry, or associated skin lesions. No contractures.  Functional ROM: Unrestricted ROM          Functional ROM: Unrestricted ROM          Muscle Tone/Strength: Functionally intact. No obvious neuro-muscular anomalies detected.  Muscle Tone/Strength: Functionally intact. No obvious neuro-muscular anomalies detected.  Sensory (Neurological): Unimpaired  Sensory (Neurological): Unimpaired  Palpation: No palpable anomalies  Palpation: No palpable anomalies   Assessment  Primary Diagnosis & Pertinent Problem List: The primary encounter diagnosis was Cervical radiculitis (Bilateral) (L>R). Diagnoses  of Cervical foraminal stenosis (Bilateral: C5-6 & C6-7, Left: C4-5 & C7-T1), Cervical facet syndrome (HCC) (L), Cervical DDD (C4-5, C5-6, C6-7 and C7-T1), Chronic lower extremity pain (Left), Chronic neck pain (Primary Source of Pain) (Bilateral) (L>R), Grade 1 Retrolisthesis of L3 over L4, Chronic right shoulder pain, and Suprascapular neuropathy, right were also pertinent to this visit.  Status Diagnosis  Resolved Stable Controlled 1. Cervical radiculitis (Bilateral) (L>R)   2. Cervical  foraminal stenosis (Bilateral: C5-6 & C6-7, Left: C4-5 & C7-T1)   3. Cervical facet syndrome (HCC) (L)   4. Cervical DDD (C4-5, C5-6, C6-7 and C7-T1)   5. Chronic lower extremity pain (Left)   6. Chronic neck pain (Primary Source of Pain) (Bilateral) (L>R)   7. Grade 1 Retrolisthesis of L3 over L4   8. Chronic right shoulder pain   9. Suprascapular neuropathy, right     Problems updated and reviewed during this visit: No problems updated. Plan of Care  Pharmacotherapy (Medications Ordered): No orders of the defined types were placed in this encounter.  Medications administered today: Sallee Lange. Fester had no medications administered during this visit.   Procedure Orders     SUPRASCAPULAR NERVE BLOCK     Cervical Epidural Injection     Lumbar Epidural Injection Lab Orders  No laboratory test(s) ordered today   Imaging Orders  No imaging studies ordered today   Referral Orders  No referral(s) requested today    Interventional management options: Planned, scheduled, and/or pending:   None at this time.   Considering:   Diagnostic bilateral lumbar facetblock  Possible bilateral lumbar facet RFA Palliative left-sided cervical epidural steroid injection #3  Diagnostic bilateral cervical facetblock  Possible bilateral cervical facet RFA Diagnostic bilateral sacroiliac joint block Possible bilateral sacroiliac joint RFA Diagnostic trigger point injections   Palliative PRN treatment(s):   Diagnostic left L4-5 interlaminar lumbar epidural steroid injection #2  Palliative left-sided cervical epidural steroid injection #3  Palliative left cervical epidural steroid injection    Provider-requested follow-up: Return if symptoms worsen or fail to improve, for PRN Procedure.  Future Appointments  Date Time Provider [place]  02/24/2018 10:00 AM Kathrine Haddock, NP [facility]   Primary Care Physician: Kathrine Haddock, NP Location: [facility]  Facility Note by: Gaspar Cola, MD Date: 11/30/2017; Time: 2:24 PM

## 2017-11-30 NOTE — Progress Notes (Signed)
Safety precautions to be maintained throughout the outpatient stay will include: orient to surroundings, keep bed in low position, maintain call bell within reach at all times, provide assistance with transfer out of bed and ambulation.  

## 2018-01-18 ENCOUNTER — Ambulatory Visit: Payer: 59 | Attending: Nurse Practitioner | Admitting: Nurse Practitioner

## 2018-01-18 ENCOUNTER — Encounter: Payer: Self-pay | Admitting: Nurse Practitioner

## 2018-01-18 VITALS — BP 133/81 | HR 65 | Temp 98.0°F | Resp 16 | Ht 67.0 in | Wt 182.0 lb

## 2018-01-18 DIAGNOSIS — M25562 Pain in left knee: Secondary | ICD-10-CM | POA: Insufficient documentation

## 2018-01-18 DIAGNOSIS — G894 Chronic pain syndrome: Secondary | ICD-10-CM

## 2018-01-18 DIAGNOSIS — R3129 Other microscopic hematuria: Secondary | ICD-10-CM | POA: Diagnosis not present

## 2018-01-18 DIAGNOSIS — M5412 Radiculopathy, cervical region: Secondary | ICD-10-CM | POA: Diagnosis not present

## 2018-01-18 DIAGNOSIS — Z833 Family history of diabetes mellitus: Secondary | ICD-10-CM | POA: Diagnosis not present

## 2018-01-18 DIAGNOSIS — M545 Low back pain, unspecified: Secondary | ICD-10-CM

## 2018-01-18 DIAGNOSIS — Z7952 Long term (current) use of systemic steroids: Secondary | ICD-10-CM | POA: Diagnosis not present

## 2018-01-18 DIAGNOSIS — M5030 Other cervical disc degeneration, unspecified cervical region: Secondary | ICD-10-CM

## 2018-01-18 DIAGNOSIS — E785 Hyperlipidemia, unspecified: Secondary | ICD-10-CM | POA: Insufficient documentation

## 2018-01-18 DIAGNOSIS — Z818 Family history of other mental and behavioral disorders: Secondary | ICD-10-CM | POA: Diagnosis not present

## 2018-01-18 DIAGNOSIS — Z79891 Long term (current) use of opiate analgesic: Secondary | ICD-10-CM

## 2018-01-18 DIAGNOSIS — M25511 Pain in right shoulder: Secondary | ICD-10-CM | POA: Insufficient documentation

## 2018-01-18 DIAGNOSIS — F419 Anxiety disorder, unspecified: Secondary | ICD-10-CM | POA: Diagnosis not present

## 2018-01-18 DIAGNOSIS — Z809 Family history of malignant neoplasm, unspecified: Secondary | ICD-10-CM | POA: Insufficient documentation

## 2018-01-18 DIAGNOSIS — Z79899 Other long term (current) drug therapy: Secondary | ICD-10-CM | POA: Insufficient documentation

## 2018-01-18 DIAGNOSIS — E559 Vitamin D deficiency, unspecified: Secondary | ICD-10-CM | POA: Diagnosis not present

## 2018-01-18 DIAGNOSIS — Z823 Family history of stroke: Secondary | ICD-10-CM | POA: Insufficient documentation

## 2018-01-18 DIAGNOSIS — Z791 Long term (current) use of non-steroidal anti-inflammatories (NSAID): Secondary | ICD-10-CM | POA: Insufficient documentation

## 2018-01-18 DIAGNOSIS — F329 Major depressive disorder, single episode, unspecified: Secondary | ICD-10-CM | POA: Diagnosis not present

## 2018-01-18 DIAGNOSIS — I10 Essential (primary) hypertension: Secondary | ICD-10-CM | POA: Insufficient documentation

## 2018-01-18 DIAGNOSIS — M25512 Pain in left shoulder: Secondary | ICD-10-CM | POA: Diagnosis not present

## 2018-01-18 DIAGNOSIS — M542 Cervicalgia: Secondary | ICD-10-CM

## 2018-01-18 DIAGNOSIS — K2970 Gastritis, unspecified, without bleeding: Secondary | ICD-10-CM | POA: Insufficient documentation

## 2018-01-18 DIAGNOSIS — M47812 Spondylosis without myelopathy or radiculopathy, cervical region: Secondary | ICD-10-CM | POA: Diagnosis not present

## 2018-01-18 DIAGNOSIS — M9981 Other biomechanical lesions of cervical region: Secondary | ICD-10-CM

## 2018-01-18 DIAGNOSIS — M62838 Other muscle spasm: Secondary | ICD-10-CM | POA: Diagnosis not present

## 2018-01-18 DIAGNOSIS — G8929 Other chronic pain: Secondary | ICD-10-CM

## 2018-01-18 DIAGNOSIS — Z9889 Other specified postprocedural states: Secondary | ICD-10-CM | POA: Diagnosis not present

## 2018-01-18 DIAGNOSIS — M4802 Spinal stenosis, cervical region: Secondary | ICD-10-CM | POA: Insufficient documentation

## 2018-01-18 DIAGNOSIS — M4803 Spinal stenosis, cervicothoracic region: Secondary | ICD-10-CM | POA: Diagnosis not present

## 2018-01-18 MED ORDER — BACLOFEN 10 MG PO TABS: 10.0000 mg | ORAL_TABLET | Freq: Every day | ORAL | 0 refills | Status: DC

## 2018-01-18 MED ORDER — HYDROCODONE-ACETAMINOPHEN 5-325 MG PO TABS: 1.0000 | ORAL_TABLET | Freq: Every day | ORAL | 0 refills | Status: DC | PRN

## 2018-01-18 NOTE — Progress Notes (Signed)
Patient's Name: Jose Washington  MRN: [id number]  Referring Provider: Kathrine Haddock, NP  DOB: June 28, 1966  PCP: Kathrine Haddock, NP  DOS: 01/18/2018  Note by: Vevelyn Francois NP  Service setting: Ambulatory outpatient  Specialty: Interventional Pain Management  Location: [facility] (AMB) Pain Management Facility    Patient type: Established    Primary Reason(s) for Visit: Encounter for prescription drug management. (Level of risk: moderate)  CC: Neck Pain (worse on the left) and Back Pain (lower left is worse)  HPI  [name] is a 52 y.o. year old, male patient, who comes today for a medication management evaluation. He has Chronic neck pain (Primary Source of Pain) (Bilateral) (L>R); Hyperlipidemia; Essential hypertension, benign; Gastritis; Chronic pain syndrome; Chronic low back pain (Secondary source of pain) (Bilateral) (L>R); Chronic upper back pain [facility] source of pain) (Bilateral) (L>R); Chronic shoulder pain (Bilateral) (L>R); Osteoarthritis of AC (acromioclavicular) joint (Right); Chronic sacroiliac joint pain (Bilateral) (L>R); Long term current use of opiate analgesic; Long term prescription opiate use; Opiate use (5 MME/Day); Muscle spasticity; Cervical DDD (C4-5, C5-6, C6-7 and C7-T1); Cervical foraminal stenosis (Bilateral: C5-6 & C6-7, Left: C4-5 & C7-T1); Cervical radiculitis (Bilateral) (L>R); Vitamin D insufficiency; Cervical facet syndrome (HCC) (L); Cervical spondylosis; Musculoskeletal neck pain (trapezius) (Left); Depression, major, single episode, severe ([place]); Chronic knee pain (Left); Chronic lower extremity pain (Left); Grade 1 Retrolisthesis of L3 over L4; Lumbar facet arthropathy (Bilateral); Lumbar facet osteoarthritis; Lumbar facet syndrome (Bilateral) (L>R); Lumbar spondylosis; Chronic right shoulder pain; and Suprascapular neuropathy, right on their problem list. His primarily concern today is the Neck Pain (worse on the left) and Back Pain (lower left is worse)  Pain  Assessment: Location: Left(left lower back) Neck(back) Radiating: na Onset: More than a month ago Duration: Chronic pain Quality: Discomfort, Dull, Aching Severity: 1 /10 (self-reported pain score)  Note: Reported level is compatible with observation.                          Effect on ADL: pain is worse after long work day  Timing: Intermittent Modifying factors: TENS unit, medications  Mr. Pak was last scheduled for an appointment on 10/19/2017 for medication management. During today's appointment we reviewed [name] chronic pain status, as well as his outpatient medication regimen. Her continues to have good relief for his ESI. He is taking the magnesium qd. He states that the TENs unit is effective. He is grateful to have this to help with his pain management. He does continue to see the chiropractor.  The patient  reports that he does not use drugs. His body mass index is 28.51 kg/m.  Further details on both, my assessment(s), as well as the proposed treatment plan, please see below.  Controlled Substance Pharmacotherapy Assessment REMS (Risk Evaluation and Mitigation Strategy)  Analgesic:Hydrocodone/APAP 5/325 one daily ME/day:7.34m/day    PJanett Billow RN  01/18/2018 11:21 AM  Sign at close encounter Nursing Pain Medication Assessment:  Safety precautions to be maintained throughout the outpatient stay will include: orient to surroundings, keep bed in low position, maintain call bell within reach at all times, provide assistance with transfer out of bed and ambulation.  Medication Inspection Compliance: Pill count conducted under aseptic conditions, in front of the patient. Neither the pills nor the bottle was removed from the patient's sight at any time. Once count was completed pills were immediately returned to the patient in their original bottle.  Medication: Hydrocodone/APAP Pill/Patch Count: 1 of  15 pills remain Pill/Patch Appearance: Markings  consistent with prescribed medication Bottle Appearance: Standard pharmacy container. Clearly labeled. Filled Date: 02 / 28 / 2019 Last Medication intake:  Yesterday   Pharmacokinetics: Liberation and absorption (onset of action): WNL Distribution (time to peak effect): WNL Metabolism and excretion (duration of action): WNL         Pharmacodynamics: Desired effects: Analgesia: [name] reports >50% benefit. Functional ability: Patient reports that medication allows him to accomplish basic ADLs Clinically meaningful improvement in function (CMIF): Sustained CMIF goals met Perceived effectiveness: Described as relatively effective, allowing for increase in activities of daily living (ADL) Undesirable effects: Side-effects or Adverse reactions: None reported Monitoring: [place] PMP: Online review of the past 31-monthperiod conducted. Compliant with practice rules and regulations Last UDS on record: Summary  Date Value Ref Range Status  07/28/2017 FINAL  Final    Comment:    ==================================================================== TOXASSURE SELECT 13 (MW) ==================================================================== Test                             Result       Flag       Units Drug Present and Declared for Prescription Verification   Hydrocodone                    538          EXPECTED   ng/mg creat   Dihydrocodeine                 108          EXPECTED   ng/mg creat   Norhydrocodone                 342          EXPECTED   ng/mg creat    Sources of hydrocodone include scheduled prescription    medications. Dihydrocodeine and norhydrocodone are expected    metabolites of hydrocodone. Dihydrocodeine is also available as a    scheduled prescription medication. ==================================================================== Test                      Result    Flag   Units      Ref Range   Creatinine              371              mg/dL       >=20 ==================================================================== Declared Medications:  The flagging and interpretation on this report are based on the  following declared medications.  Unexpected results may arise from  inaccuracies in the declared medications.  **Note: The testing scope of this panel includes these medications:  Hydrocodone (Norco)  **Note: The testing scope of this panel does not include following  reported medications:  Acetaminophen (Norco)  Acetaminophen (Tylenol)  Atenolol (Tenormin)  Atorvastatin (Lipitor)  Baclofen  Buspirone  Calcium Carbonate  Chondroitin  Chondroitin (Glucosamine-Chondroitin)  Cyanocobalamin  Desloratadine (Clarinex-D)  Diphenhydramine  Duloxetine (Cymbalta)  Fenofibrate  Fluticasone (Flonase)  Glucosamine (Glucosamine-Chondroitin)  Ibuprofen  Iron (Ferrous Sulfate)  Melatonin  Methylsulfonylmethane  Multivitamin  Omeprazole  Ondansetron  Potassium  Pseudoephedrine (Clarinex-D)  Turmeric  Vitamin D3 ==================================================================== For clinical consultation, please call ([phone] ====================================================================    UDS interpretation: Compliant          Medication Assessment Form: Reviewed. Patient indicates being compliant with therapy Treatment compliance: Compliant Risk Assessment Profile: Aberrant behavior: See  prior evaluations. None observed or detected today Comorbid factors increasing risk of overdose: See prior notes. No additional risks detected today Risk of substance use disorder (SUD): Low Opioid Risk Tool - 01/18/18 1117      Family History of Substance Abuse   Alcohol  Negative    Illegal Drugs  Negative    Rx Drugs  Negative      Personal History of Substance Abuse   Alcohol  Negative    Illegal Drugs  Negative    Rx Drugs  Negative      Psychological Disease   Psychological Disease  Positive    ADD   Negative    OCD  Negative    Bipolar  Negative    Schizophrenia  Negative    Depression  Positive taking buspar and cymbalta which are keeping anxiety and depression under control   taking buspar and cymbalta which are keeping anxiety and depression under control     Total Score   Opioid Risk Tool Scoring  3    Opioid Risk Interpretation  Low Risk      ORT Scoring interpretation table:  Score <3 = Low Risk for SUD  Score between 4-7 = Moderate Risk for SUD  Score >8 = High Risk for Opioid Abuse   Risk Mitigation Strategies:  Patient Counseling: Covered Patient-Prescriber Agreement (PPA): Present and active  Notification to other healthcare providers: Done  Pharmacologic Plan: No change in therapy, at this time.             Laboratory Chemistry  Inflammation Markers (CRP: Acute Phase) (ESR: Chronic Phase) Lab Results  Component Value Date   CRP 1.4 (H) 11/12/2016   ESRSEDRATE 3 11/12/2016                         Rheumatology Markers Lab Results  Component Value Date   LABURIC 5.6 07/30/2015                Renal Function Markers Lab Results  Component Value Date   BUN 14 02/21/2017   CREATININE 1.15 02/21/2017   GFRAA 85 02/21/2017   GFRNONAA 74 02/21/2017                 Hepatic Function Markers Lab Results  Component Value Date   AST 35 02/21/2017   ALT 53 (H) 02/21/2017   ALBUMIN 4.9 02/21/2017   ALKPHOS 56 02/21/2017                 Electrolytes Lab Results  Component Value Date   NA 146 (H) 02/21/2017   K 4.7 02/21/2017   CL 106 02/21/2017   CALCIUM 9.8 02/21/2017   MG 2.0 11/12/2016                        Neuropathy Markers Lab Results  Component Value Date   VITAMINB12 820 11/12/2016                 Bone Pathology Markers Lab Results  Component Value Date   25OHVITD1 23 (L) 11/12/2016   25OHVITD2 <1.0 11/12/2016   25OHVITD3 23 11/12/2016                         Coagulation Parameters Lab Results  Component Value Date   PLT 389  (H) 02/21/2017  Cardiovascular Markers Lab Results  Component Value Date   HGB 15.4 02/21/2017   HCT 44.5 02/21/2017                 CA Markers No results found for: CEA, CA125, LABCA2               Note: Lab results reviewed.  Recent Diagnostic Imaging Results  DG C-Arm 1-60 Min-No Report Fluoroscopy was utilized by the requesting physician.  No radiographic  interpretation.   Complexity Note: Imaging results reviewed. Results shared with Mr. Ladnier, using Layman's terms.                         Meds   Current Outpatient Medications:  .  acetaminophen (TYLENOL) 500 MG tablet, Take 500 mg by mouth every 6 (six) hours as needed., Disp: , Rfl:  .  atenolol (TENORMIN) 50 MG tablet, Take 1 tablet (50 mg total) by mouth daily., Disp: 90 tablet, Rfl: 1 .  atorvastatin (LIPITOR) 40 MG tablet, Take 1 tablet (40 mg total) by mouth daily., Disp: 90 tablet, Rfl: 1 .  benzonatate (TESSALON PERLES) 100 MG capsule, Take 1 capsule by mouth 3 (three) times daily., Disp: , Rfl:  .  busPIRone (BUSPAR) 30 MG tablet, Take 1 tablet (30 mg total) by mouth 2 (two) times daily., Disp: 180 tablet, Rfl: 1 .  Calcium Carbonate (CALCIUM 600 PO), Take 600 mg by mouth daily., Disp: , Rfl:  .  Cholecalciferol (VITAMIN D3) 2000 units TABS, Take 1,000 mg by mouth daily. , Disp: , Rfl:  .  Chondroitin Sulfate 400 MG CAPS, Take by mouth 2 (two) times daily., Disp: , Rfl:  .  desloratadine-pseudoephedrine (CLARINEX-D 12-HOUR) 2.5-120 MG 12 hr tablet, Take 1 tablet by mouth as needed., Disp: , Rfl:  .  DiphenhydrAMINE HCl (DIPHEDRYL ALLERGY PO), Take 25 mg by mouth daily., Disp: , Rfl:  .  DULoxetine (CYMBALTA) 30 MG capsule, Take 1 capsule (30 mg total) by mouth 2 (two) times daily., Disp: 180 capsule, Rfl: 1 .  fenofibrate 160 MG tablet, Take 1 tablet (160 mg total) by mouth daily., Disp: 90 tablet, Rfl: 1 .  fluticasone (FLONASE) 50 MCG/ACT nasal spray, Place 2 sprays into both nostrils 2 (two)  times daily., Disp: , Rfl:  .  Glucosamine-Chondroit-Vit C-Mn (GLUCOSAMINE 1500 COMPLEX PO), Take by mouth 2 (two) times daily., Disp: , Rfl:  .  ibuprofen (ADVIL,MOTRIN) 200 MG tablet, Take 200 mg by mouth 3 (three) times a week., Disp: , Rfl:  .  magnesium oxide (MAG-OX) 400 MG tablet, Take 400 mg by mouth daily., Disp: , Rfl:  .  Melatonin 5 MG TABS, Take by mouth at bedtime. , Disp: , Rfl:  .  Methylsulfonylmethane (MSM PO), Take by mouth 2 (two) times daily., Disp: , Rfl:  .  Multiple Vitamin (MULTIVITAMIN) tablet, Take 1 tablet by mouth daily., Disp: , Rfl:  .  omeprazole (PRILOSEC) 20 MG capsule, TAKE 1 CAPSULE (20 MG TOTAL) BY MOUTH DAILY., Disp: 90 capsule, Rfl: 1 .  ondansetron (ZOFRAN) 8 MG tablet, Take 1 tablet (8 mg total) by mouth 2 (two) times daily., Disp: 180 tablet, Rfl: 3 .  Potassium 99 MG TABS, Take 1 tablet by mouth., Disp: , Rfl:  .  predniSONE (DELTASONE) 20 MG tablet, Take 20 mg by mouth 2 (two) times daily., Disp: , Rfl:  .  vitamin B-12 (CYANOCOBALAMIN) 1000 MCG tablet, Take 1,000 mcg by mouth daily., Disp: ,  Rfl:  .  [START ON 01/24/2018] baclofen (LIORESAL) 10 MG tablet, Take 1 tablet (10 mg total) by mouth at bedtime., Disp: 90 tablet, Rfl: 0 .  [START ON 03/25/2018] HYDROcodone-acetaminophen (NORCO/VICODIN) 5-325 MG tablet, Take 1 tablet by mouth daily as needed for moderate pain., Disp: 15 tablet, Rfl: 0 .  [START ON 02/23/2018] HYDROcodone-acetaminophen (NORCO/VICODIN) 5-325 MG tablet, Take 1 tablet by mouth daily as needed for moderate pain., Disp: 15 tablet, Rfl: 0 .  [START ON 01/24/2018] HYDROcodone-acetaminophen (NORCO/VICODIN) 5-325 MG tablet, Take 1 tablet by mouth daily as needed for moderate pain., Disp: 15 tablet, Rfl: 0  ROS  Constitutional: Denies any fever or chills Gastrointestinal: No reported hemesis, hematochezia, vomiting, or acute GI distress Musculoskeletal: Denies any acute onset joint swelling, redness, loss of ROM, or weakness Neurological: No  reported episodes of acute onset apraxia, aphasia, dysarthria, agnosia, amnesia, paralysis, loss of coordination, or loss of consciousness  Allergies  Mr. Eriksson has No Known Allergies.  [place]  Drug: Mr. Rhoads  reports that he does not use drugs. Alcohol:  reports that he does not drink alcohol. Tobacco:  reports that  has never smoked. he has never used smokeless tobacco. Medical:  has a past medical history of Allergy, Anxiety, Chronic duodenal ulcer with hemorrhage (2012), Chronic neck pain, Depression, Hyperlipidemia, Hypertension, and Microscopic hematuria. Surgical: Mr. Knierim  has a past surgical history that includes eye muscle repair [phone] and 1975) and bLEEDING ULCER (2012). Family: family history includes Allergies in his brother; Cancer in his maternal grandfather and mother; Dementia in his father and paternal grandfather; Depression in his father; Diabetes in his mother; Mental illness in his father; Stroke in his paternal grandfather.  Constitutional Exam  General appearance: Well nourished, well developed, and well hydrated. In no apparent acute distress Vitals:   01/18/18 1107  BP: 133/81  Pulse: 65  Resp: 16  Temp: 98 F (36.7 C)  TempSrc: Oral  SpO2: 99%  Weight: 182 lb (82.6 kg)  Height: '5\' 7"'  (1.702 m)   BMI Assessment: Estimated body mass index is 28.51 kg/m as calculated from the following:   Height as of this encounter: '5\' 7"'  (1.702 m).   Weight as of this encounter: 182 lb (82.6 kg). Psych/Mental status: Alert, oriented x 3 (person, place, & time)       Eyes: PERLA Respiratory: No evidence of acute respiratory distress  Cervical Spine Area Exam  Skin & Axial Inspection: No masses, redness, edema, swelling, or associated skin lesions Alignment: Symmetrical Functional ROM: Adequate ROM      Stability: No instability detected Muscle Tone/Strength: Guarding observed increased muscle tone Sensory (Neurological): Unimpaired Palpation: Complains of area  being tender to palpation              Upper Extremity (UE) Exam    Side: Right upper extremity  Side: Left upper extremity  Skin & Extremity Inspection: Skin color, temperature, and hair growth are WNL. No peripheral edema or cyanosis. No masses, redness, swelling, asymmetry, or associated skin lesions. No contractures.  Skin & Extremity Inspection: Skin color, temperature, and hair growth are WNL. No peripheral edema or cyanosis. No masses, redness, swelling, asymmetry, or associated skin lesions. No contractures.  Functional ROM: Unrestricted ROM          Functional ROM: Unrestricted ROM          Muscle Tone/Strength: Functionally intact. No obvious neuro-muscular anomalies detected.  Muscle Tone/Strength: Functionally intact. No obvious neuro-muscular anomalies detected.  Sensory (Neurological): Unimpaired  Sensory (Neurological): Unimpaired          Palpation: No palpable anomalies              Palpation: No palpable anomalies              Specialized Test(s): Deferred         Specialized Test(s): Deferred          Thoracic Spine Area Exam  Skin & Axial Inspection: No masses, redness, or swelling Alignment: Symmetrical Functional ROM: Unrestricted ROM Stability: No instability detected Muscle Tone/Strength: Functionally intact. No obvious neuro-muscular anomalies detected. Sensory (Neurological): Unimpaired Muscle strength & Tone: No palpable anomalies  Lumbar Spine Area Exam  Skin & Axial Inspection: No masses, redness, or swelling Alignment: Symmetrical Functional ROM: Unrestricted ROM      Stability: No instability detected Muscle Tone/Strength: Functionally intact. No obvious neuro-muscular anomalies detected. Sensory (Neurological): Unimpaired Palpation: No palpable anomalies       Provocative Tests: Lumbar Hyperextension and rotation test: evaluation deferred today       Lumbar Lateral bending test: evaluation deferred today       Patrick's Maneuver: evaluation  deferred today                    Gait & Posture Assessment  Ambulation: Unassisted Gait: Relatively normal for age and body habitus Posture: WNL   Lower Extremity Exam    Side: Right lower extremity  Side: Left lower extremity  Skin & Extremity Inspection: Skin color, temperature, and hair growth are WNL. No peripheral edema or cyanosis. No masses, redness, swelling, asymmetry, or associated skin lesions. No contractures.  Skin & Extremity Inspection: Skin color, temperature, and hair growth are WNL. No peripheral edema or cyanosis. No masses, redness, swelling, asymmetry, or associated skin lesions. No contractures.  Functional ROM: Unrestricted ROM          Functional ROM: Unrestricted ROM          Muscle Tone/Strength: Functionally intact. No obvious neuro-muscular anomalies detected.  Muscle Tone/Strength: Functionally intact. No obvious neuro-muscular anomalies detected.  Sensory (Neurological): Unimpaired  Sensory (Neurological): Unimpaired  Palpation: No palpable anomalies  Palpation: No palpable anomalies   Assessment  Primary Diagnosis & Pertinent Problem List: The primary encounter diagnosis was Cervical spondylosis. Diagnoses of Chronic low back pain (Location of Secondary source of pain) (Bilateral) (L>R), Chronic shoulder pain (Bilateral) (L>R), Muscle spasticity, Chronic pain syndrome, Cervical radiculitis (Bilateral) (L>R), Cervical foraminal stenosis (Bilateral: C5-6 & C6-7, Left: C4-5 & C7-T1), Cervical DDD (C4-5, C5-6, C6-7 and C7-T1), Chronic neck pain (Primary Source of Pain) (Bilateral) (L>R), and Long term current use of opiate analgesic were also pertinent to this visit.  Status Diagnosis  Stable Stable Stable 1. Cervical spondylosis   2. Chronic low back pain (Location of Secondary source of pain) (Bilateral) (L>R)   3. Chronic shoulder pain (Bilateral) (L>R)   4. Muscle spasticity   5. Chronic pain syndrome   6. Cervical radiculitis (Bilateral) (L>R)   7.  Cervical foraminal stenosis (Bilateral: C5-6 & C6-7, Left: C4-5 & C7-T1)   8. Cervical DDD (C4-5, C5-6, C6-7 and C7-T1)   9. Chronic neck pain (Primary Source of Pain) (Bilateral) (L>R)   10. Long term current use of opiate analgesic     Problems updated and reviewed during this visit: No problems updated. Plan of Care  Pharmacotherapy (Medications Ordered): Meds ordered this encounter  Medications  . baclofen (LIORESAL) 10 MG tablet    Sig: Take 1 tablet (10  mg total) by mouth at bedtime.    Dispense:  90 tablet    Refill:  0    Do not place medication on "Automatic Refill". Fill one day early if pharmacy is closed on scheduled refill date.    Order Specific Question:   Supervising Provider    Answer:   Milinda Pointer [phone]  . HYDROcodone-acetaminophen (NORCO/VICODIN) 5-325 MG tablet    Sig: Take 1 tablet by mouth daily as needed for moderate pain.    Dispense:  15 tablet    Refill:  0    Fill one day early if pharmacy is closed on scheduled refill date. Do not fill until:03/25/2018 To last until:04/24/2018    Order Specific Question:   Supervising Provider    Answer:   Milinda Pointer [phone]  . HYDROcodone-acetaminophen (NORCO/VICODIN) 5-325 MG tablet    Sig: Take 1 tablet by mouth daily as needed for moderate pain.    Dispense:  15 tablet    Refill:  0    Fill one day early if pharmacy is closed on scheduled refill date. Do not fill until: 02/23/2018 To last until:03/25/2018    Order Specific Question:   Supervising Provider    Answer:   Milinda Pointer [phone]  . HYDROcodone-acetaminophen (NORCO/VICODIN) 5-325 MG tablet    Sig: Take 1 tablet by mouth daily as needed for moderate pain.    Dispense:  15 tablet    Refill:  0    Fill one day early if pharmacy is closed on scheduled refill date. Do not fill until:01/24/2018 To last until: 02/23/2018    Order Specific Question:   Supervising Provider    Answer:   Milinda Pointer [phone]   New Prescriptions   No  medications on file   Medications administered today: Sallee Lange. Budney had no medications administered during this visit. Lab-work, procedure(s), and/or referral(s): Orders Placed This Encounter  Procedures  . ToxASSURE Select 13 (MW), Urine   Imaging and/or referral(s): None  Interventional management options: Planned, scheduled, and/or pending: Palliative left-sided cervical epidural steroid injection#2    Considering: Diagnostic bilateral lumbar facetblock  Possible bilateral lumbar facet RFA Palliative left-sided cervical epidural steroid injection#2 Diagnostic bilateral cervical facetblock  Possible bilateral cervical facet RFA Diagnostic bilateral sacroiliac joint block Possible bilateral sacroiliac joint RFA Diagnostic trigger point injections   Palliative PRN treatment(s): Diagnostic left L4-5 interlaminar lumbar epidural steroid injection #2 Palliative left-sided cervical epidural steroid injection#2      Provider-requested follow-up: Return in about 3 months (around 04/20/2018) for MedMgmt with Me Dionisio David), w/ Dr. Dossie Arbour, Procedure(w/Sedation), ([facility]).  Future Appointments  Date Time Provider Aristes  01/20/2018  1:30 PM Nyra Capes [facility]  02/24/2018 10:00 AM Kathrine Haddock, NP [facility]  04/12/2018 11:15 AM Vevelyn Francois, NP [facility] None   Primary Care Physician: Kathrine Haddock, NP Location: [facility] Outpatient Pain Management Facility Note by: Vevelyn Francois NP Date: 01/18/2018; Time: 12:26 PM  Pain Score Disclaimer: We use the NRS-11 scale. This is a self-reported, subjective measurement of pain severity with only modest accuracy. It is used primarily to identify changes within a particular patient. It must be understood that outpatient pain scales are significantly less accurate that those used for research, where they can be applied under ideal controlled circumstances with minimal exposure to  variables. In reality, the score is likely to be a combination of pain intensity and pain affect, where pain affect describes the degree of emotional arousal or changes in action  readiness caused by the sensory experience of pain. Factors such as social and work situation, setting, emotional state, anxiety levels, expectation, and prior pain experience may influence pain perception and show large inter-individual differences that may also be affected by time variables.  Patient instructions provided during this appointment: Patient Instructions  ____________________________________________________________________________________________  Medication Rules  Applies to: All patients receiving prescriptions (written or electronic).  Pharmacy of record: Pharmacy where electronic prescriptions will be sent. If written prescriptions are taken to a different pharmacy, please inform the nursing staff. The pharmacy listed in the electronic medical record should be the one where you would like electronic prescriptions to be sent.  Prescription refills: Only during scheduled appointments. Applies to both, written and electronic prescriptions.  NOTE: The following applies primarily to controlled substances (Opioid* Pain Medications).   Patient's responsibilities: 1. Pain Pills: Bring all pain pills to every appointment (except for procedure appointments). 2. Pill Bottles: Bring pills in original pharmacy bottle. Always bring newest bottle. Bring bottle, even if empty. 3. Medication refills: You are responsible for knowing and keeping track of what medications you need refilled. The day before your appointment, write a list of all prescriptions that need to be refilled. Bring that list to your appointment and give it to the admitting nurse. Prescriptions will be written only during appointments. If you forget a medication, it will not be "Called in", "Faxed", or "electronically sent". You will need to get  another appointment to get these prescribed. 4. Prescription Accuracy: You are responsible for carefully inspecting your prescriptions before leaving our office. Have the discharge nurse carefully go over each prescription with you, before taking them home. Make sure that your name is accurately spelled, that your address is correct. Check the name and dose of your medication to make sure it is accurate. Check the number of pills, and the written instructions to make sure they are clear and accurate. Make sure that you are given enough medication to last until your next medication refill appointment. 5. Taking Medication: Take medication as prescribed. Never take more pills than instructed. Never take medication more frequently than prescribed. Taking less pills or less frequently is permitted and encouraged, when it comes to controlled substances (written prescriptions).  6. Inform other Doctors: Always inform, all of your healthcare providers, of all the medications you take. 7. Pain Medication from other Providers: You are not allowed to accept any additional pain medication from any other Doctor or Healthcare provider. There are two exceptions to this rule. (see below) In the event that you require additional pain medication, you are responsible for notifying us, as stated below. 8. Medication Agreement: You are responsible for carefully reading and following our Medication Agreement. This must be signed before receiving any prescriptions from our practice. Safely store a copy of your signed Agreement. Violations to the Agreement will result in no further prescriptions. (Additional copies of our Medication Agreement are available upon request.) 9. Laws, Rules, & Regulations: All patients are expected to follow all Federal and [employer], [employer], Rules, [employer]. Ignorance of the Laws does not constitute a valid excuse. The use of any illegal substances is prohibited. 10. Adopted CDC guidelines &  recommendations: Target dosing levels will be at or below 60 MME/day. Use of benzodiazepines** is not recommended.  Exceptions: There are only two exceptions to the rule of not receiving pain medications from other Healthcare Providers. 1. Exception #1 (Emergencies): In the event of an emergency (i.e.: accident requiring emergency care), you are  allowed to receive additional pain medication. However, you are responsible for: As soon as you are able, call our office [phone], at any time of the day or night, and leave a message stating your name, the date and nature of the emergency, and the name and dose of the medication prescribed. In the event that your call is answered by a member of our staff, make sure to document and save the date, time, and the name of the person that took your information.  2. Exception #2 (Planned Surgery): In the event that you are scheduled by another doctor or dentist to have any type of surgery or procedure, you are allowed (for a period no longer than 30 days), to receive additional pain medication, for the acute post-op pain. However, in this case, you are responsible for picking up a copy of our "Post-op Pain Management for Surgeons" handout, and giving it to your surgeon or dentist. This document is available at our office, and does not require an appointment to obtain it. Simply go to our office during business hours (Monday-Thursday from 8:00 AM to 4:00 PM) (Friday 8:00 AM to 12:00 Noon) or if you have a scheduled appointment with [place], prior to your surgery, and ask for it by name. In addition, you will need to provide [place] with your name, name of your surgeon, type of surgery, and date of procedure or surgery.  *Opioid medications include: morphine, codeine, oxycodone, oxymorphone, hydrocodone, hydromorphone, meperidine, tramadol, tapentadol, buprenorphine, fentanyl, methadone. **Benzodiazepine medications include: diazepam (Valium), alprazolam (Xanax), clonazepam  (Klonopine), lorazepam (Ativan), clorazepate (Tranxene), chlordiazepoxide (Librium), estazolam (Prosom), oxazepam (Serax), temazepam (Restoril), triazolam (Halcion) (Last updated: 12/29/2017) ____________________________________________________________________________________________

## 2018-01-18 NOTE — Patient Instructions (Signed)
____________________________________________________________________________________________  Medication Rules  Applies to: All patients receiving prescriptions (written or electronic).  Pharmacy of record: Pharmacy where electronic prescriptions will be sent. If written prescriptions are taken to a different pharmacy, please inform the nursing staff. The pharmacy listed in the electronic medical record should be the one where you would like electronic prescriptions to be sent.  Prescription refills: Only during scheduled appointments. Applies to both, written and electronic prescriptions.  NOTE: The following applies primarily to controlled substances (Opioid* Pain Medications).   Patient's responsibilities: 1. Pain Pills: Bring all pain pills to every appointment (except for procedure appointments). 2. Pill Bottles: Bring pills in original pharmacy bottle. Always bring newest bottle. Bring bottle, even if empty. 3. Medication refills: You are responsible for knowing and keeping track of what medications you need refilled. The day before your appointment, write a list of all prescriptions that need to be refilled. Bring that list to your appointment and give it to the admitting nurse. Prescriptions will be written only during appointments. If you forget a medication, it will not be "Called in", "Faxed", or "electronically sent". You will need to get another appointment to get these prescribed. 4. Prescription Accuracy: You are responsible for carefully inspecting your prescriptions before leaving our office. Have the discharge nurse carefully go over each prescription with you, before taking them home. Make sure that your name is accurately spelled, that your address is correct. Check the name and dose of your medication to make sure it is accurate. Check the number of pills, and the written instructions to make sure they are clear and accurate. Make sure that you are given enough medication to last  until your next medication refill appointment. 5. Taking Medication: Take medication as prescribed. Never take more pills than instructed. Never take medication more frequently than prescribed. Taking less pills or less frequently is permitted and encouraged, when it comes to controlled substances (written prescriptions).  6. Inform other Doctors: Always inform, all of your healthcare providers, of all the medications you take. 7. Pain Medication from other Providers: You are not allowed to accept any additional pain medication from any other Doctor or Healthcare provider. There are two exceptions to this rule. (see below) In the event that you require additional pain medication, you are responsible for notifying us, as stated below. 8. Medication Agreement: You are responsible for carefully reading and following our Medication Agreement. This must be signed before receiving any prescriptions from our practice. Safely store a copy of your signed Agreement. Violations to the Agreement will result in no further prescriptions. (Additional copies of our Medication Agreement are available upon request.) 9. Laws, Rules, & Regulations: All patients are expected to follow all Federal and State Laws, Statutes, Rules, & Regulations. Ignorance of the Laws does not constitute a valid excuse. The use of any illegal substances is prohibited. 10. Adopted CDC guidelines & recommendations: Target dosing levels will be at or below 60 MME/day. Use of benzodiazepines** is not recommended.  Exceptions: There are only two exceptions to the rule of not receiving pain medications from other Healthcare Providers. 1. Exception #1 (Emergencies): In the event of an emergency (i.e.: accident requiring emergency care), you are allowed to receive additional pain medication. However, you are responsible for: As soon as you are able, call our office [phone], at any time of the day or night, and leave a message stating your name, the  date and nature of the emergency, and the name and dose of the medication   prescribed. In the event that your call is answered by a member of our staff, make sure to document and save the date, time, and the name of the person that took your information.  2. Exception #2 (Planned Surgery): In the event that you are scheduled by another doctor or dentist to have any type of surgery or procedure, you are allowed (for a period no longer than 30 days), to receive additional pain medication, for the acute post-op pain. However, in this case, you are responsible for picking up a copy of our "Post-op Pain Management for Surgeons" handout, and giving it to your surgeon or dentist. This document is available at our office, and does not require an appointment to obtain it. Simply go to our office during business hours (Monday-Thursday from 8:00 AM to 4:00 PM) (Friday 8:00 AM to 12:00 Noon) or if you have a scheduled appointment with us, prior to your surgery, and ask for it by name. In addition, you will need to provide us with your name, name of your surgeon, type of surgery, and date of procedure or surgery.  *Opioid medications include: morphine, codeine, oxycodone, oxymorphone, hydrocodone, hydromorphone, meperidine, tramadol, tapentadol, buprenorphine, fentanyl, methadone. **Benzodiazepine medications include: diazepam (Valium), alprazolam (Xanax), clonazepam (Klonopine), lorazepam (Ativan), clorazepate (Tranxene), chlordiazepoxide (Librium), estazolam (Prosom), oxazepam (Serax), temazepam (Restoril), triazolam (Halcion) (Last updated: 12/29/2017) ____________________________________________________________________________________________    

## 2018-01-18 NOTE — Progress Notes (Signed)
Nursing Pain Medication Assessment:  Safety precautions to be maintained throughout the outpatient stay will include: orient to surroundings, keep bed in low position, maintain call bell within reach at all times, provide assistance with transfer out of bed and ambulation.  Medication Inspection Compliance: Pill count conducted under aseptic conditions, in front of the patient. Neither the pills nor the bottle was removed from the patient's sight at any time. Once count was completed pills were immediately returned to the patient in their original bottle.  Medication: Hydrocodone/APAP Pill/Patch Count: 1 of 15 pills remain Pill/Patch Appearance: Markings consistent with prescribed medication Bottle Appearance: Standard pharmacy container. Clearly labeled. Filled Date: 02 / 28 / 2019 Last Medication intake:  Yesterday

## 2018-01-20 ENCOUNTER — Encounter: Payer: Self-pay | Admitting: Family Medicine

## 2018-01-20 ENCOUNTER — Ambulatory Visit (INDEPENDENT_AMBULATORY_CARE_PROVIDER_SITE_OTHER): Payer: 59 | Admitting: Family Medicine

## 2018-01-20 VITALS — BP 129/73 | HR 68 | Temp 98.6°F | Wt 187.6 lb

## 2018-01-20 DIAGNOSIS — J069 Acute upper respiratory infection, unspecified: Secondary | ICD-10-CM | POA: Diagnosis not present

## 2018-01-20 MED ORDER — AZITHROMYCIN 250 MG PO TABS: ORAL_TABLET | ORAL | 0 refills | Status: DC

## 2018-01-20 MED ORDER — ALBUTEROL SULFATE HFA 108 (90 BASE) MCG/ACT IN AERS: 2.0000 | INHALATION_SPRAY | Freq: Four times a day (QID) | RESPIRATORY_TRACT | 0 refills | Status: DC | PRN

## 2018-01-20 MED ORDER — GUAIFENESIN ER 600 MG PO TB12: 600.0000 mg | ORAL_TABLET | Freq: Two times a day (BID) | ORAL | 0 refills | Status: DC

## 2018-01-20 MED ORDER — BENZONATATE 200 MG PO CAPS: 200.0000 mg | ORAL_CAPSULE | Freq: Two times a day (BID) | ORAL | 0 refills | Status: DC | PRN

## 2018-01-20 NOTE — Progress Notes (Signed)
   BP 129/73 (BP Location: Left Arm, Patient Position: Sitting, Cuff Size: Normal)   Pulse 68   Temp 98.6 F (37 C) (Oral)   Wt 187 lb 9.6 oz (85.1 kg)   SpO2 98%   BMI 29.38 kg/m    Subjective:    Patient ID: Jose Washington, male    DOB: August 31, 1966, 52 y.o.   MRN: [id number]  HPI: Jose Washington is a 52 y.o. male  Chief Complaint  Patient presents with  . Bronchitis    Better, but still coughing some. Patient has a busy weekend with work. Wants to know if he needs to start Tessalon and Prednisone again.  . Nasal Congestion   Pt here for bronchitis f/u. Went to [facility] almost a week ago, now total of about 10 days of sxs. Dx'd with bronchitis and treated with prednisone and tessalon perles. Feels some better. Has completed both medications and states they helped a lot. Still experiencing yellow nasal congestion, productive cough with feelings of chest congestion and intermittent wheezing. Denies fevers, SOB, CP, sore throat, ear pain. Taking zyrtec, flonase, delsym currently. No hx of pulmonary dz, non smoker.   Relevant past medical, surgical, family and social history reviewed and updated as indicated. Interim medical history since our last visit reviewed. Allergies and medications reviewed and updated.  Review of Systems  Per HPI unless specifically indicated above     Objective:    BP 129/73 (BP Location: Left Arm, Patient Position: Sitting, Cuff Size: Normal)   Pulse 68   Temp 98.6 F (37 C) (Oral)   Wt 187 lb 9.6 oz (85.1 kg)   SpO2 98%   BMI 29.38 kg/m   Wt Readings from Last 3 Encounters:  01/20/18 187 lb 9.6 oz (85.1 kg)  01/18/18 182 lb (82.6 kg)  11/30/17 182 lb (82.6 kg)    Physical Exam  Constitutional: He is oriented to person, place, and time. He appears well-developed and well-nourished.  HENT:  Head: Atraumatic.  Right Ear: External ear normal.  Left Ear: External ear normal.  Oropharynx and nasal mucosa erythematous  Eyes: Pupils are equal,  round, and reactive to light. Conjunctivae are normal.  Neck: Normal range of motion. Neck supple.  Cardiovascular: Normal rate and normal heart sounds.  Pulmonary/Chest: Effort normal. No respiratory distress. He has wheezes.  Musculoskeletal: Normal range of motion.  Lymphadenopathy:    He has no cervical adenopathy.  Neurological: He is alert and oriented to person, place, and time.  Skin: Skin is warm and dry.  Psychiatric: He has a normal mood and affect. His behavior is normal.  Nursing note and vitals reviewed.   Results for orders placed or performed in visit on 07/28/17  ToxASSURE Select 70 (MW), Urine  Result Value Ref Range   Summary FINAL       Assessment & Plan:   Problem List Items Addressed This Visit    None    Visit Diagnoses    Upper respiratory tract infection, unspecified type    -  Primary   Continue tessalon, allergy regimen. Start mucinex, albuterol inhaler, humidifier. If worsening over next 4-5 days, may start zpack. F/u if no better   Relevant Medications   azithromycin (ZITHROMAX) 250 MG tablet       Follow up plan: Return if symptoms worsen or fail to improve.

## 2018-01-23 LAB — TOXASSURE SELECT 13 (MW), URINE

## 2018-01-23 NOTE — Patient Instructions (Signed)
Follow up as needed

## 2018-01-29 IMAGING — CR DG LUMBAR SPINE COMPLETE W/ BEND
7 series · 7 of 7 positions shown · non-contrast
Comparison: None.

CLINICAL DATA: Non radiating low back pain.  No known injury.

EXAM:
LUMBAR SPINE - COMPLETE WITH BENDING VIEWS

[l-spine ap]
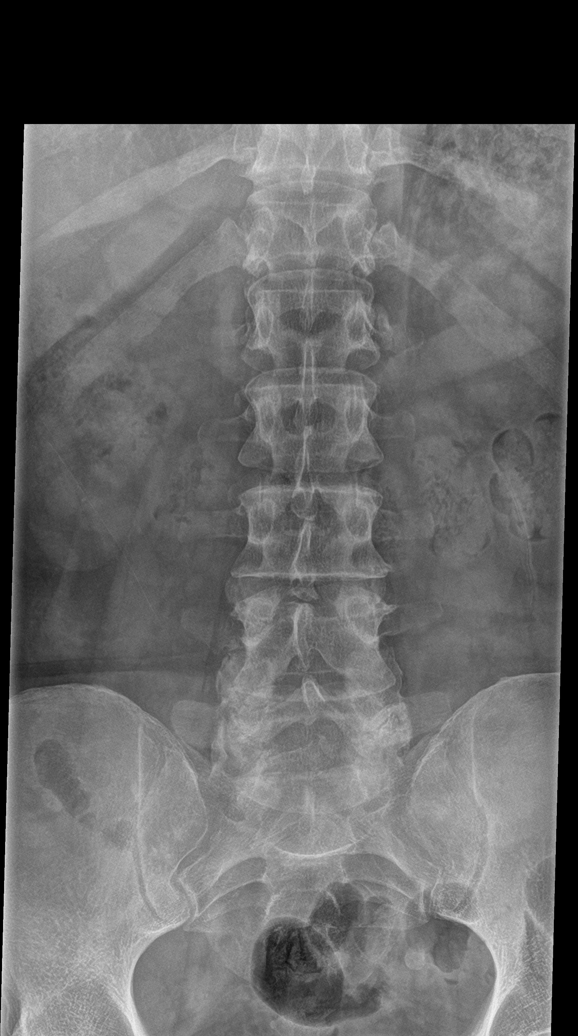

[l-spine obl (1 of 2)]
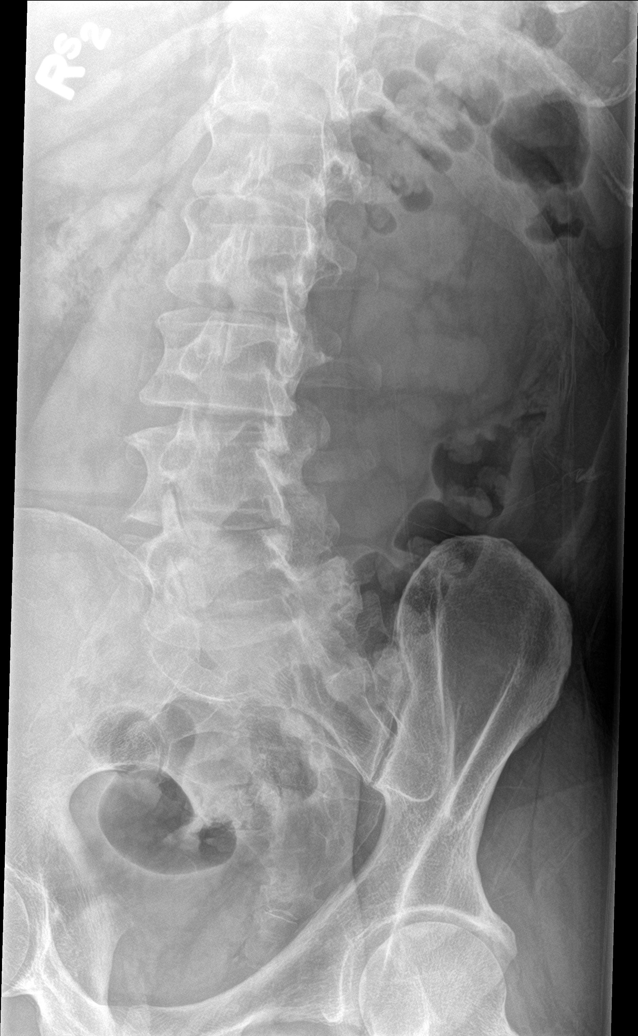

[l-spine obl (2 of 2)]
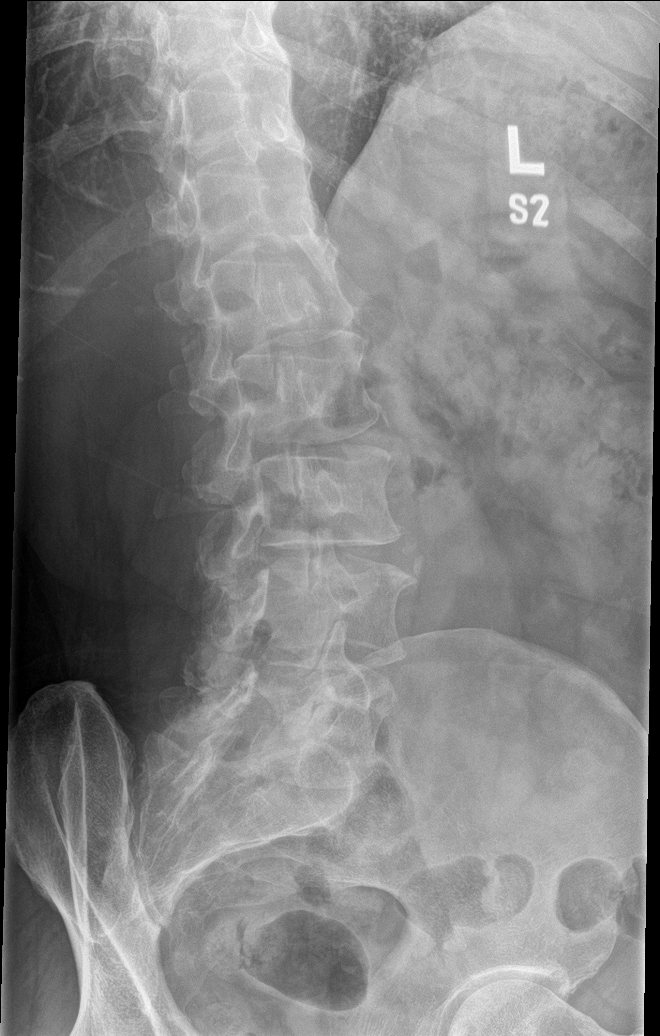

[l-spine lat]
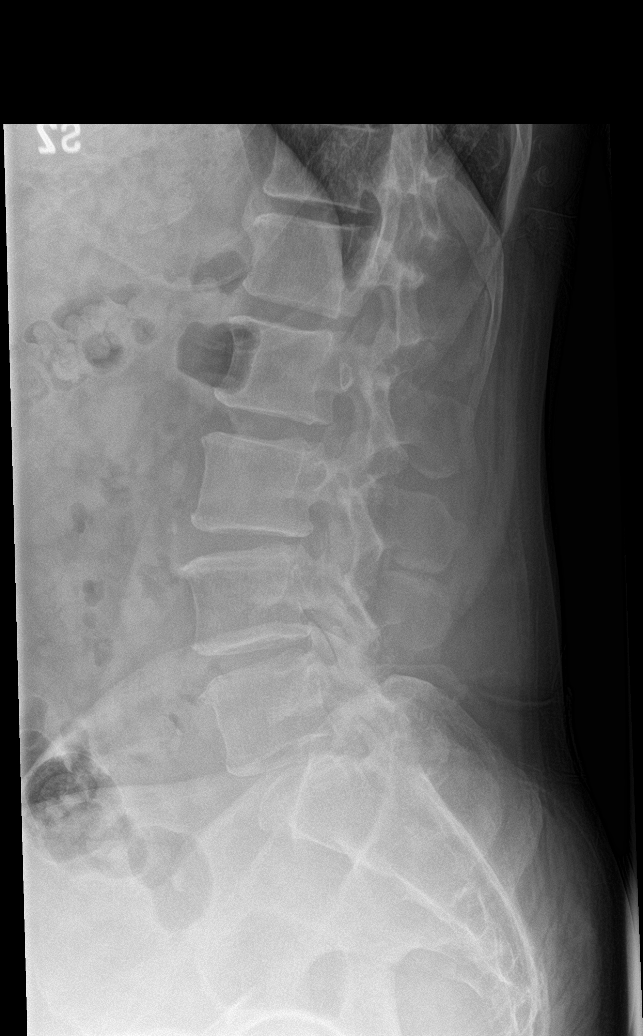

[l-spine flex]
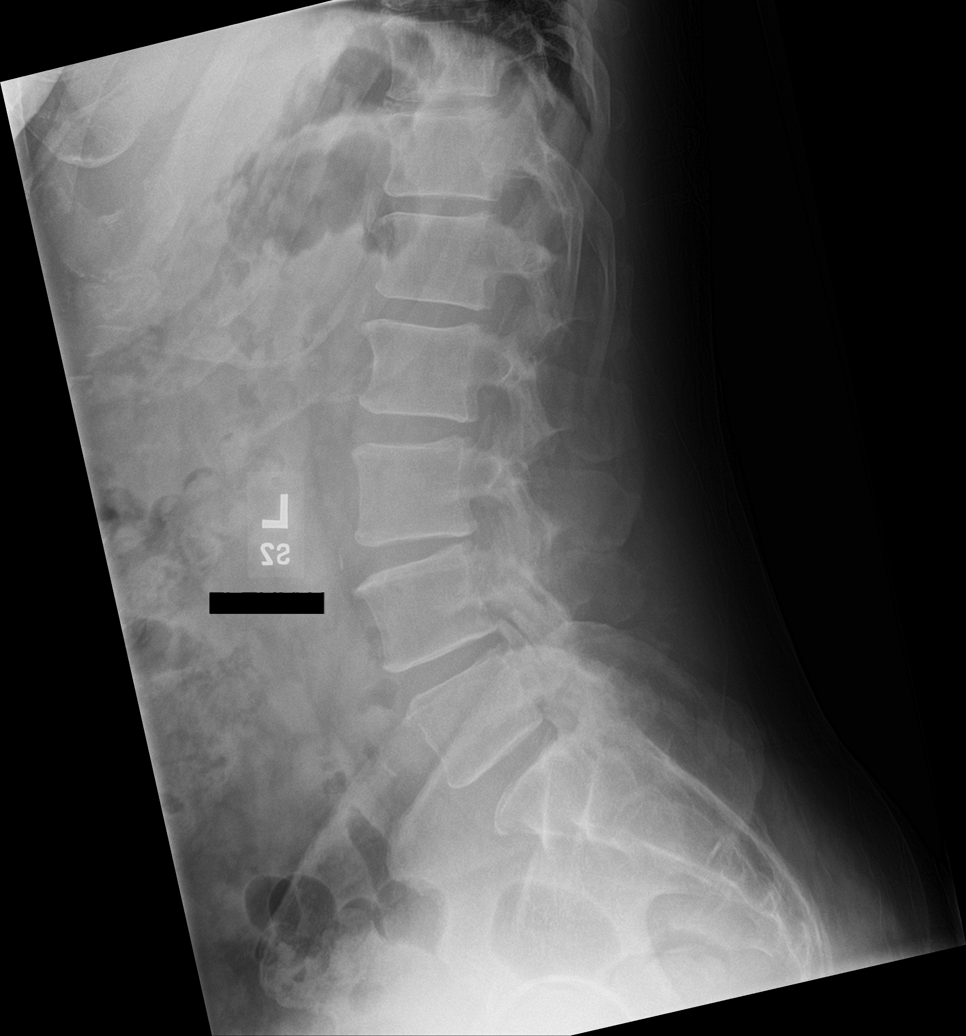

[l-spine ext]
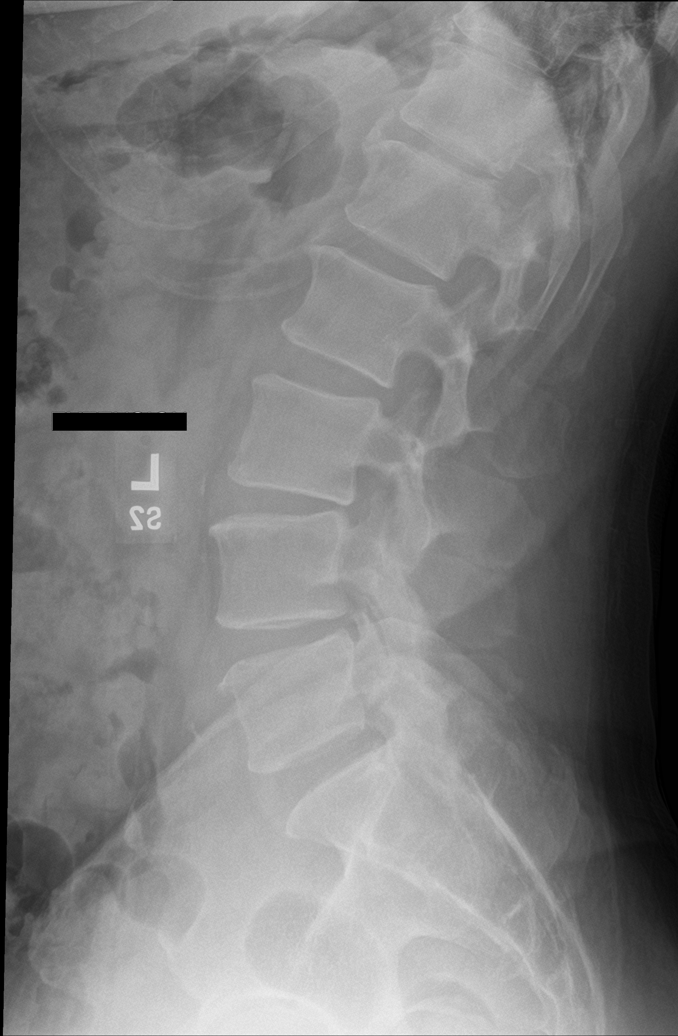

[l-spine spot]
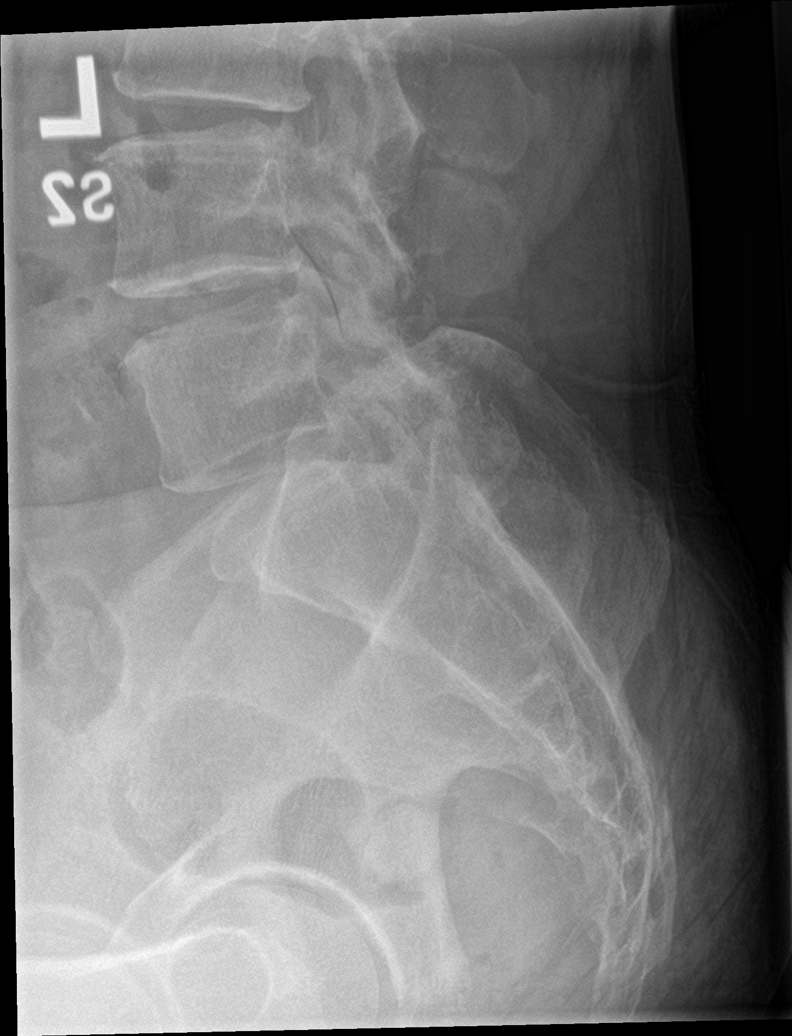

[7 of 7 positions shown; findings below may reference images not displayed]

FINDINGS: Vertebral body height is maintained. Trace retrolisthesis L3 on L4
is identified. There is some loss of disc space height at L3-4.
Facet arthropathy is seen at L4-5 and L5-S1. Range of motion appears
limited with flexion and extension but no pathologic motion is
identified.
IMPRESSION: No acute abnormality.

Mild appearing lower lumbar degenerative change.

Limited range of motion without pathologic motion.

## 2018-01-29 IMAGING — CR DG SHOULDER 2+V*L*
3 series · 3 of 3 positions shown · non-contrast
Comparison: None.

CLINICAL DATA: Bilateral shoulder pain, chronic.  No known injury.

EXAM:
LEFT SHOULDER - 2+ VIEW

[shoulder grashey]
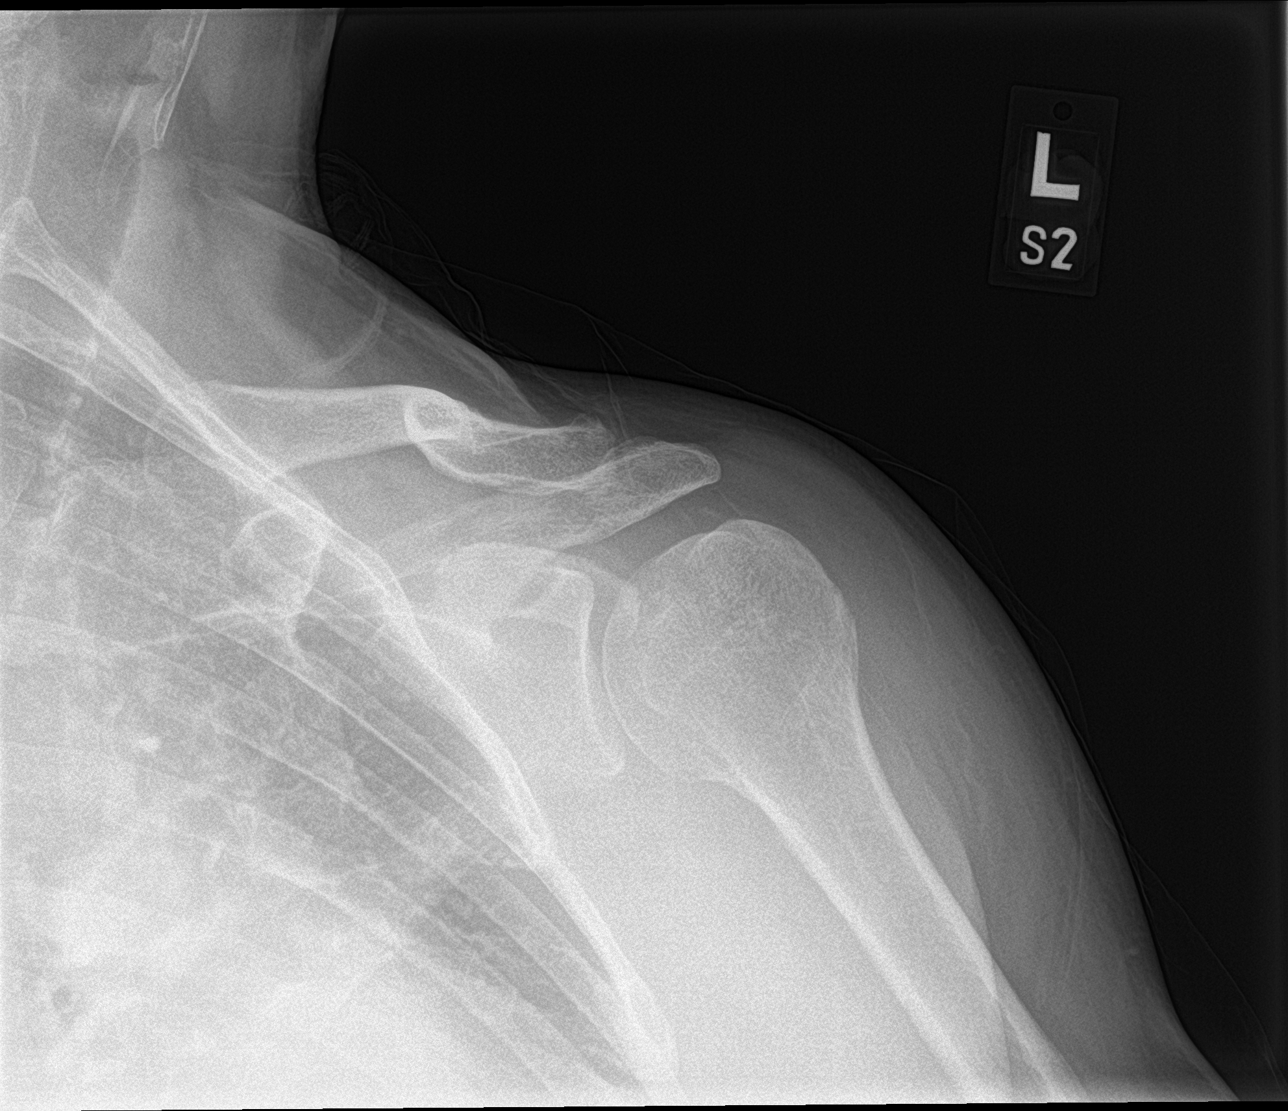

[shoulder y view]
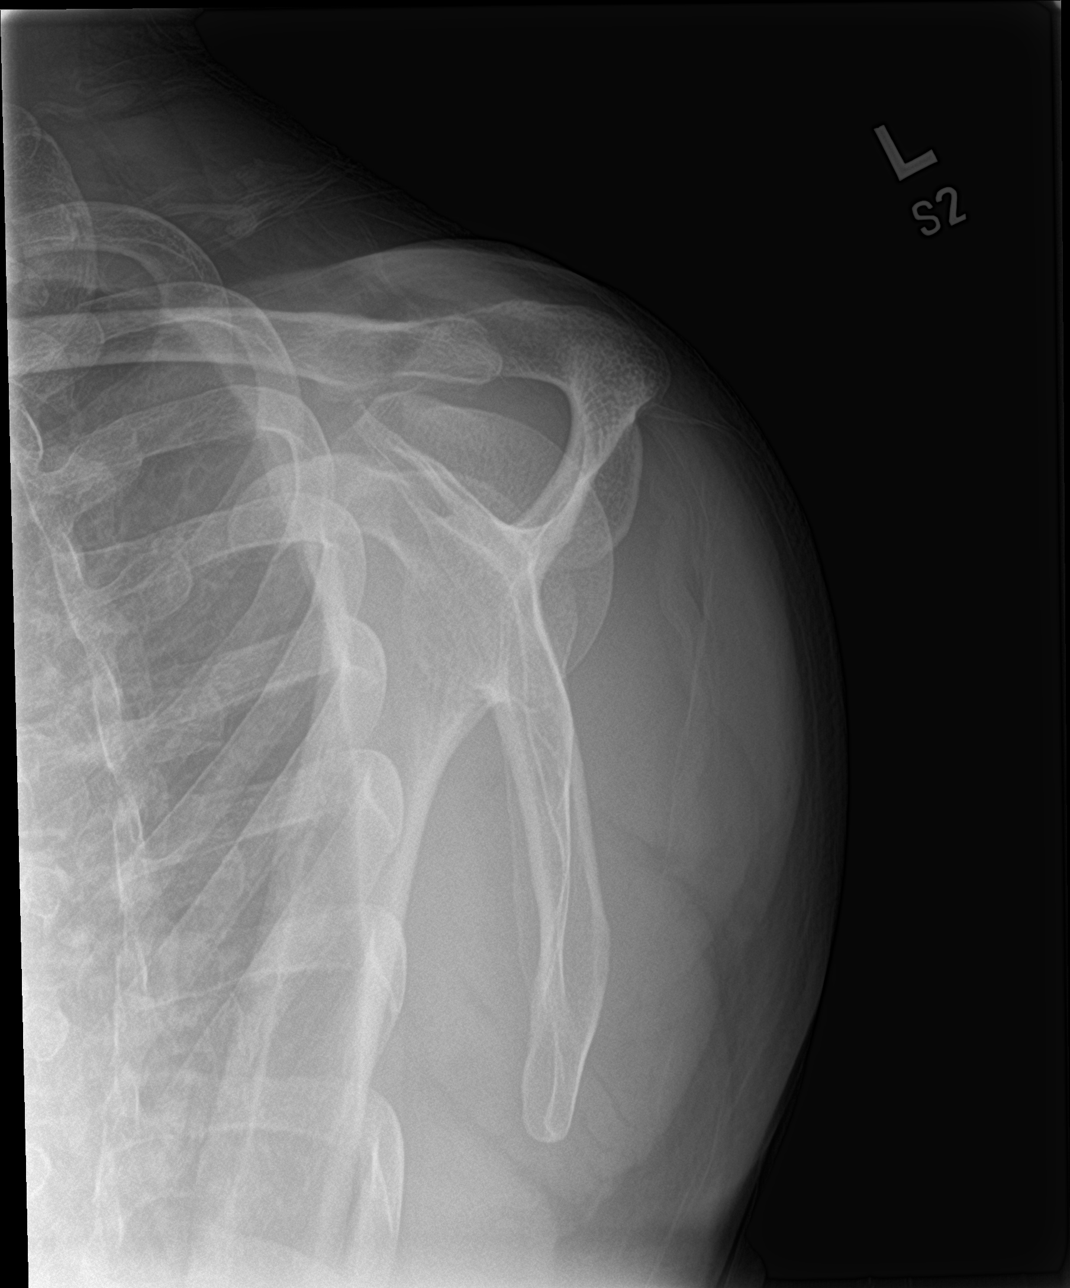

[shoulder axillary]
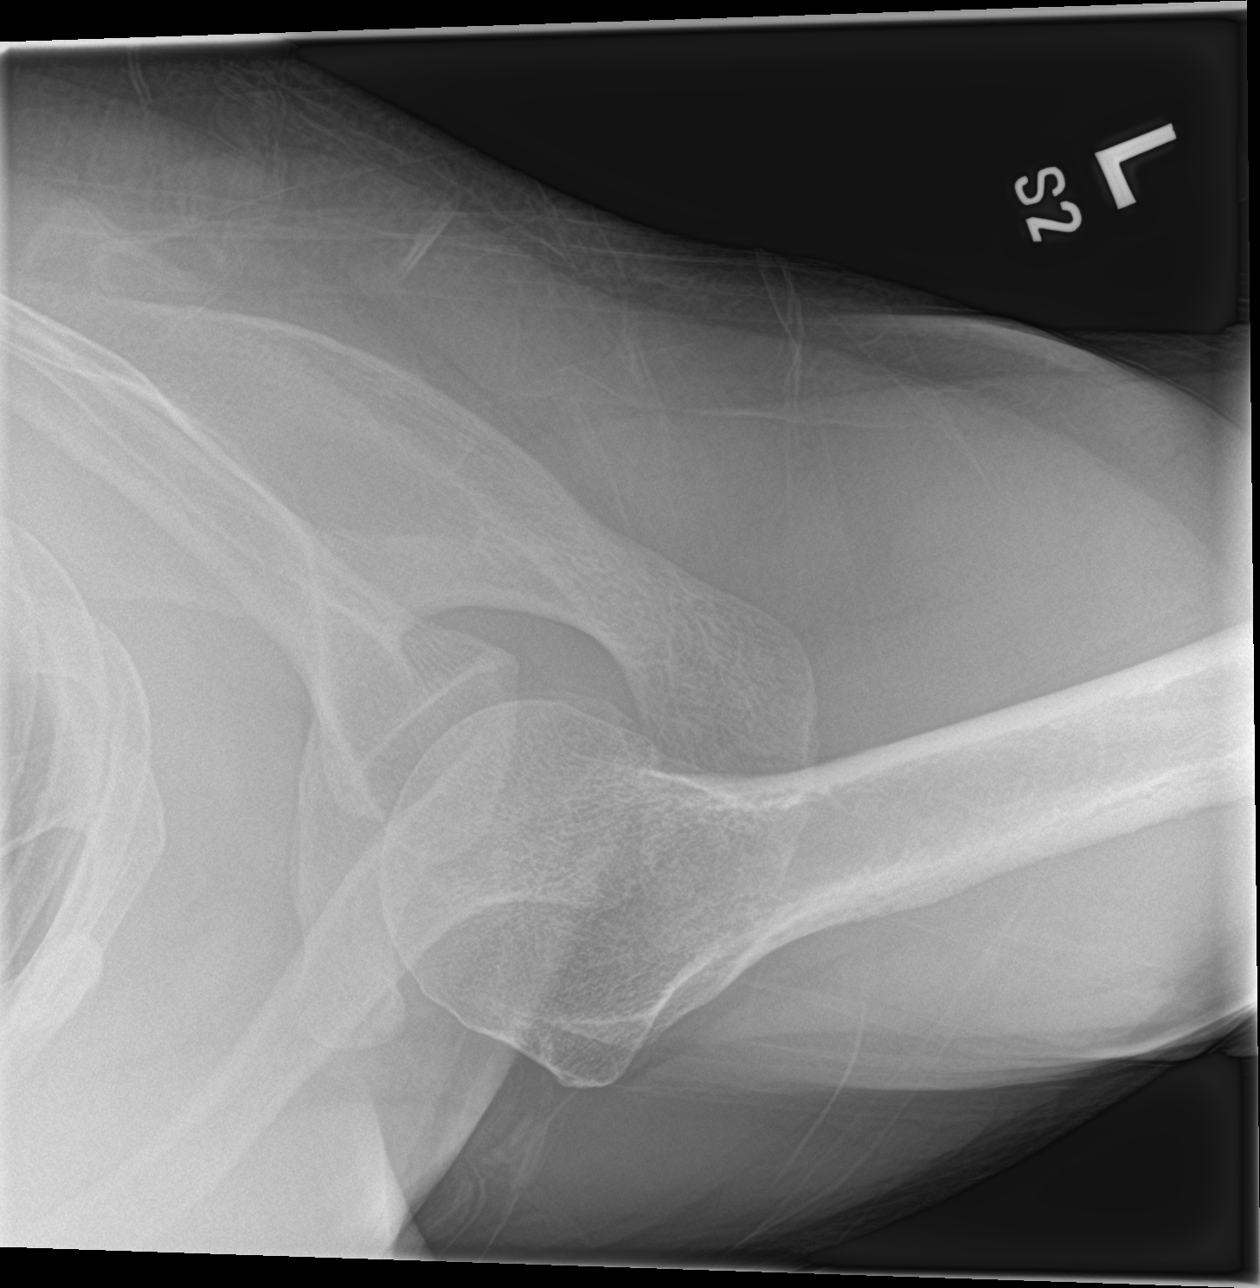

[3 of 3 positions shown; findings below may reference images not displayed]

FINDINGS: There is no evidence of fracture or dislocation. Mild
acromioclavicular degenerative change is seen. Soft tissues are
unremarkable.
IMPRESSION: Mild acromioclavicular osteoarthritis.  Otherwise negative.

## 2018-02-07 ENCOUNTER — Ambulatory Visit: Payer: 59 | Admitting: Pain Medicine

## 2018-02-16 ENCOUNTER — Other Ambulatory Visit: Payer: Self-pay

## 2018-02-16 ENCOUNTER — Ambulatory Visit (HOSPITAL_BASED_OUTPATIENT_CLINIC_OR_DEPARTMENT_OTHER): Payer: 59 | Admitting: Pain Medicine

## 2018-02-16 ENCOUNTER — Encounter: Payer: Self-pay | Admitting: Pain Medicine

## 2018-02-16 ENCOUNTER — Ambulatory Visit
Admission: RE | Admit: 2018-02-16 | Discharge: 2018-02-16 | Disposition: A | Discharge status: Home / Self Care | Payer: 59 | Source: Ambulatory Visit | Attending: Pain Medicine | Admitting: Pain Medicine

## 2018-02-16 VITALS — BP 138/97 | HR 79 | Temp 98.2°F | Resp 19 | Ht 67.0 in | Wt 184.0 lb

## 2018-02-16 DIAGNOSIS — G8929 Other chronic pain: Secondary | ICD-10-CM

## 2018-02-16 DIAGNOSIS — M4803 Spinal stenosis, cervicothoracic region: Secondary | ICD-10-CM | POA: Insufficient documentation

## 2018-02-16 DIAGNOSIS — M47812 Spondylosis without myelopathy or radiculopathy, cervical region: Secondary | ICD-10-CM

## 2018-02-16 DIAGNOSIS — M542 Cervicalgia: Secondary | ICD-10-CM

## 2018-02-16 DIAGNOSIS — M5412 Radiculopathy, cervical region: Secondary | ICD-10-CM

## 2018-02-16 DIAGNOSIS — M5030 Other cervical disc degeneration, unspecified cervical region: Secondary | ICD-10-CM | POA: Diagnosis not present

## 2018-02-16 DIAGNOSIS — M4802 Spinal stenosis, cervical region: Secondary | ICD-10-CM | POA: Insufficient documentation

## 2018-02-16 DIAGNOSIS — M4722 Other spondylosis with radiculopathy, cervical region: Secondary | ICD-10-CM | POA: Insufficient documentation

## 2018-02-16 DIAGNOSIS — M5033 Other cervical disc degeneration, cervicothoracic region: Secondary | ICD-10-CM | POA: Insufficient documentation

## 2018-02-16 MED ORDER — ROPIVACAINE HCL 2 MG/ML IJ SOLN
1.0000 mL | Freq: Once | INTRAMUSCULAR | Status: AC
  Administered 2018-02-16: 1 mL via EPIDURAL

## 2018-02-16 MED ORDER — DEXAMETHASONE SODIUM PHOSPHATE 10 MG/ML IJ SOLN
INTRAMUSCULAR | Status: AC
  Filled 2018-02-16: qty 1

## 2018-02-16 MED ORDER — LIDOCAINE HCL 2 % IJ SOLN
20.0000 mL | Freq: Once | INTRAMUSCULAR | Status: AC
  Administered 2018-02-16: 400 mg

## 2018-02-16 MED ORDER — SODIUM CHLORIDE 0.9% FLUSH
1.0000 mL | Freq: Once | INTRAVENOUS | Status: AC
  Administered 2018-02-16: 1 mL

## 2018-02-16 MED ORDER — ROPIVACAINE HCL 2 MG/ML IJ SOLN
INTRAMUSCULAR | Status: AC
  Filled 2018-02-16: qty 10

## 2018-02-16 MED ORDER — DEXAMETHASONE SODIUM PHOSPHATE 10 MG/ML IJ SOLN
10.0000 mg | Freq: Once | INTRAMUSCULAR | Status: AC
  Administered 2018-02-16: 10 mg

## 2018-02-16 MED ORDER — LIDOCAINE HCL 2 % IJ SOLN
INTRAMUSCULAR | Status: AC
  Filled 2018-02-16: qty 20

## 2018-02-16 MED ORDER — IOPAMIDOL (ISOVUE-M 200) INJECTION 41%
10.0000 mL | Freq: Once | INTRAMUSCULAR | Status: AC
  Administered 2018-02-16: 10 mL via EPIDURAL
  Filled 2018-02-16: qty 10

## 2018-02-16 MED ORDER — SODIUM CHLORIDE 0.9 % IJ SOLN
INTRAMUSCULAR | Status: AC
  Filled 2018-02-16: qty 10

## 2018-02-16 NOTE — Patient Instructions (Addendum)
____________________________________________________________________________________________  Post-Procedure Discharge Instructions  Instructions:  Apply ice: Fill a plastic sandwich bag with crushed ice. Cover it with a small towel and apply to injection site. Apply for 15 minutes then remove x 15 minutes. Repeat sequence on day of procedure, until you go to bed. The purpose is to minimize swelling and discomfort after procedure.  Apply heat: Apply heat to procedure site starting the day following the procedure. The purpose is to treat any soreness and discomfort from the procedure.  Food intake: Start with clear liquids (like water) and advance to regular food, as tolerated.   Physical activities: Keep activities to a minimum for the first 8 hours after the procedure.   Driving: If you have received any sedation, you are not allowed to drive for 24 hours after your procedure.  Blood thinner: Restart your blood thinner 6 hours after your procedure. (Only for those taking blood thinners)  Insulin: As soon as you can eat, you may resume your normal dosing schedule. (Only for those taking insulin)  Infection prevention: Keep procedure site clean and dry.  Post-procedure Pain Diary: Extremely important that this be done correctly and accurately. Recorded information will be used to determine the next step in treatment.  Pain evaluated is that of treated area only. Do not include pain from an untreated area.  Complete every hour, on the hour, for the initial 8 hours. Set an alarm to help you do this part accurately.  Do not go to sleep and have it completed later. It will not be accurate.  Follow-up appointment: Keep your follow-up appointment after the procedure. Usually 2 weeks for most procedures. (6 weeks in the case of radiofrequency.) Bring you pain diary.   Expect:  From numbing medicine (AKA: Local Anesthetics): Numbness or decrease in pain.  Onset: Full effect within 15  minutes of injected.  Duration: It will depend on the type of local anesthetic used. On the average, 1 to 8 hours.   From steroids: Decrease in swelling or inflammation. Once inflammation is improved, relief of the pain will follow.  Onset of benefits: Depends on the amount of swelling present. The more swelling, the longer it will take for the benefits to be seen. In some cases, up to 10 days.  Duration: Steroids will stay in the system x 2 weeks. Duration of benefits will depend on multiple posibilities including persistent irritating factors.  From procedure: Some discomfort is to be expected once the numbing medicine wears off. This should be minimal if ice and heat are applied as instructed.  Call if:  You experience numbness and weakness that gets worse with time, as opposed to wearing off.  New onset bowel or bladder incontinence. (This applies to Spinal procedures only)  Emergency Numbers:  Durning business hours (Monday - Thursday, 8:00 AM - 4:00 PM) (Friday, 9:00 AM - 12:00 Noon): [phone]  After hours: [phone] ____________________________________________________________________________________________   Pain Management Discharge Instructions  General Discharge Instructions :  If you need to reach your doctor call: Monday-Friday 8:00 am - 4:00 pm at [phone] or toll free [phone].  After clinic hours [phone] to have operator reach doctor.  Bring all of your medication bottles to all your appointments in the pain clinic.  To cancel or reschedule your appointment with Pain Management please remember to call 24 hours in advance to avoid a fee.  Refer to the educational materials which you have been given on: General Risks, I had my Procedure. Discharge Instructions, Post Sedation.    Post Procedure Instructions:  The drugs you were given will stay in your system until tomorrow, so for the next 24 hours you should not drive, make any legal  decisions or drink any alcoholic beverages.  You may eat anything you prefer, but it is better to start with liquids then soups and crackers, and gradually work up to solid foods.  Please notify your doctor immediately if you have any unusual bleeding, trouble breathing or pain that is not related to your normal pain.  Depending on the type of procedure that was done, some parts of your body may feel week and/or numb.  This usually clears up by tonight or the next day.  Walk with the use of an assistive device or accompanied by an adult for the 24 hours.  You may use ice on the affected area for the first 24 hours.  Put ice in a Ziploc bag and cover with a towel and place against area 15 minutes on 15 minutes off.  You may switch to heat after 24 hours.Epidural Steroid Injection Patient Information  Description: The epidural space surrounds the nerves as they exit the spinal cord.  In some patients, the nerves can be compressed and inflamed by a bulging disc or a tight spinal canal (spinal stenosis).  By injecting steroids into the epidural space, we can bring irritated nerves into direct contact with a potentially helpful medication.  These steroids act directly on the irritated nerves and can reduce swelling and inflammation which often leads to decreased pain.  Epidural steroids may be injected anywhere along the spine and from the neck to the low back depending upon the location of your pain.   After numbing the skin with local anesthetic (like Novocaine), a small needle is passed into the epidural space slowly.  You may experience a sensation of pressure while this is being done.  The entire block usually last less than 10 minutes.  Conditions which may be treated by epidural steroids:   Low back and leg pain  Neck and arm pain  Spinal stenosis  Post-laminectomy syndrome  Herpes zoster (shingles) pain  Pain from compression fractures  Preparation for the injection:  1. Do not  eat any solid food or dairy products within 8 hours of your appointment.  2. You may drink clear liquids up to 3 hours before appointment.  Clear liquids include water, black coffee, juice or soda.  No milk or cream please. 3. You may take your regular medication, including pain medications, with a sip of water before your appointment  Diabetics should hold regular insulin (if taken separately) and take 1/2 normal NPH dos the morning of the procedure.  Carry some sugar containing items with you to your appointment. 4. A driver must accompany you and be prepared to drive you home after your procedure.  5. Bring all your current medications with your. 6. An IV may be inserted and sedation may be given at the discretion of the physician.   7. A blood pressure cuff, EKG and other monitors will often be applied during the procedure.  Some patients may need to have extra oxygen administered for a short period. 8. You will be asked to provide medical information, including your allergies, prior to the procedure.  We must know immediately if you are taking blood thinners (like Coumadin/Warfarin)  Or if you are allergic to IV iodine contrast (dye). We must know if you could possible be pregnant.  Possible side-effects:  Bleeding from needle site    Infection (rare, may require surgery)  Nerve injury (rare)  Numbness & tingling (temporary)  Difficulty urinating (rare, temporary)  Spinal headache ( a headache worse with upright posture)  Light -headedness (temporary)  Pain at injection site (several days)  Decreased blood pressure (temporary)  Weakness in arm/leg (temporary)  Pressure sensation in back/neck (temporary)  Call if you experience:  Fever/chills associated with headache or increased back/neck pain.  Headache worsened by an upright position.  New onset weakness or numbness of an extremity below the injection site  Hives or difficulty breathing (go to the emergency  room)  Inflammation or drainage at the infection site  Severe back/neck pain  Any new symptoms which are concerning to you  Please note:  Although the local anesthetic injected can often make your back or neck feel good for several hours after the injection, the pain will likely return.  It takes 3-7 days for steroids to work in the epidural space.  You may not notice any pain relief for at least that one week.  If effective, we will often do a series of three injections spaced 3-6 weeks apart to maximally decrease your pain.  After the initial series, we generally will wait several months before considering a repeat injection of the same type.  If you have any questions, please call [phone] [facility] 

## 2018-02-16 NOTE — Progress Notes (Signed)
Safety precautions to be maintained throughout the outpatient stay will include: orient to surroundings, keep bed in low position, maintain call bell within reach at all times, provide assistance with transfer out of bed and ambulation.  

## 2018-02-16 NOTE — Progress Notes (Signed)
Patient's Name: Jose Washington  MRN: [id number]  Referring Provider: Milinda Pointer, MD  DOB: [birth year]  PCP: Kathrine Haddock, NP  DOS: 02/16/2018  Note by: Gaspar Cola, MD  Service setting: Ambulatory outpatient  Specialty: Interventional Pain Management  Patient type: Established  Location: [facility] (AMB) Pain Management Facility  Visit type: Interventional Procedure   Primary Reason for Visit: Interventional Pain Management Treatment. CC: Neck Pain (left side)  Procedure:       Anesthesia, Analgesia, Anxiolysis:  Type: Diagnostic, Inter-Laminar, Epidural Steroid Injection #3  Region: Posterior Cervico-thoracic Region Level: C7-T1 Laterality: Left-Sided Paramedial  Type: Local Anesthesia Indication(s): Analgesia         Route: Infiltration (/IM) IV Access: Declined Sedation: Declined  Local Anesthetic: Lidocaine 1-2%   Indications: 1. Cervical DDD (C4-5, C5-6, C6-7 and C7-T1)   2. Cervical radiculitis (Bilateral) (L>R)   3. Cervical foraminal stenosis (Bilateral: C5-6 & C6-7, Left: C4-5 & C7-T1)   4. Cervical spondylosis   5. Chronic neck pain (Primary Source of Pain) (Bilateral) (L>R)    Pain Score: Pre-procedure: 2 /10 Post-procedure: 4 /10  Pre-op Assessment:  [name] is a 52 y.o. (year old), male patient, seen today for interventional treatment. He  has a past surgical history that includes eye muscle repair (1972 and 1975) and bLEEDING ULCER (2012). [name] has a current medication list which includes the following prescription(s): acetaminophen, albuterol, atenolol, atorvastatin, baclofen, buspirone, calcium carbonate, vitamin d3, chondroitin sulfate, desloratadine-pseudoephedrine, diphenhydramine hcl, duloxetine, fenofibrate, fluticasone, glucosamine-chondroit-vit c-mn, guaifenesin, hydrocodone-acetaminophen, hydrocodone-acetaminophen, hydrocodone-acetaminophen, ibuprofen, magnesium oxide, melatonin, methylsulfonylmethane, multivitamin, omeprazole,  ondansetron, potassium, vitamin b-12, azithromycin, and benzonatate. His primarily concern today is the Neck Pain (left side)  Initial Vital Signs:  Pulse/HCG Rate: 79ECG Heart Rate: 73 Temp: 98.2 F (36.8 C) Resp: 16 BP: 122/76 SpO2: 98 %  BMI: Estimated body mass index is 28.82 kg/m as calculated from the following:   Height as of this encounter: 5\' 7"  (1.702 m).   Weight as of this encounter: 184 lb (83.5 kg).  Risk Assessment: Allergies: Reviewed. He has No Known Allergies.  Allergy Precautions: None required Coagulopathies: Reviewed. None identified.  Blood-thinner therapy: None at this time Active Infection(s): Reviewed. None identified. [name] is afebrile  Site Confirmation: [name] was asked to confirm the procedure and laterality before marking the site Procedure checklist: Completed Consent: Before the procedure and under the influence of no sedative(s), amnesic(s), or anxiolytics, the patient was informed of the treatment options, risks and possible complications. To fulfill our ethical and legal obligations, as recommended by the American Medical Association's Code of Ethics, I have informed the patient of my clinical impression; the nature and purpose of the treatment or procedure; the risks, benefits, and possible complications of the intervention; the alternatives, including doing nothing; the risk(s) and benefit(s) of the alternative treatment(s) or procedure(s); and the risk(s) and benefit(s) of doing nothing. The patient was provided information about the general risks and possible complications associated with the procedure. These may include, but are not limited to: failure to achieve desired goals, infection, bleeding, organ or nerve damage, allergic reactions, paralysis, and death. In addition, the patient was informed of those risks and complications associated to Spine-related procedures, such as failure to decrease pain; infection (i.e.: Meningitis,  epidural or intraspinal abscess); bleeding (i.e.: epidural hematoma, subarachnoid hemorrhage, or any other type of intraspinal or peri-dural bleeding); organ or nerve damage (i.e.: Any type of peripheral nerve, nerve root, or spinal cord injury) with subsequent damage to sensory,  motor, and/or autonomic systems, resulting in permanent pain, numbness, and/or weakness of one or several areas of the body; allergic reactions; (i.e.: anaphylactic reaction); and/or death. Furthermore, the patient was informed of those risks and complications associated with the medications. These include, but are not limited to: allergic reactions (i.e.: anaphylactic or anaphylactoid reaction(s)); adrenal axis suppression; blood sugar elevation that in diabetics may result in ketoacidosis or comma; water retention that in patients with history of congestive heart failure may result in shortness of breath, pulmonary edema, and decompensation with resultant heart failure; weight gain; swelling or edema; medication-induced neural toxicity; particulate matter embolism and blood vessel occlusion with resultant organ, and/or nervous system infarction; and/or aseptic necrosis of one or more joints. Finally, the patient was informed that Medicine is not an exact science; therefore, there is also the possibility of unforeseen or unpredictable risks and/or possible complications that may result in a catastrophic outcome. The patient indicated having understood very clearly. We have given the patient no guarantees and we have made no promises. Enough time was given to the patient to ask questions, all of which were answered to the patient's satisfaction. [name] has indicated that he wanted to continue with the procedure. Attestation: I, the ordering provider, attest that I have discussed with the patient the benefits, risks, side-effects, alternatives, likelihood of achieving goals, and potential problems during recovery for the procedure  that I have provided informed consent. Date  Time: 02/16/2018 12:25 PM  Pre-Procedure Preparation:  Monitoring: As per clinic protocol. Respiration, ETCO2, SpO2, BP, heart rate and rhythm monitor placed and checked for adequate function Safety Precautions: Patient was assessed for positional comfort and pressure points before starting the procedure. Time-out: I initiated and conducted the "Time-out" before starting the procedure, as per protocol. The patient was asked to participate by confirming the accuracy of the "Time Out" information. Verification of the correct person, site, and procedure were performed and confirmed by me, the nursing staff, and the patient. "Time-out" conducted as per Joint Commission's Universal Protocol (UP.01.01.01). Time: 1310  Description of Procedure Process:   Position: Prone with head of the table was raised to facilitate breathing. Target Area: For Epidural Steroid injections the target is the interlaminar space, initially targeting the lower border of the superior vertebral body lamina. Approach: Paramedial approach. Area Prepped: Entire PosteriorCervical Region Prepping solution: ChloraPrep (2% chlorhexidine gluconate and 70% isopropyl alcohol) Safety Precautions: Aspiration looking for blood return was conducted prior to all injections. At no point did we inject any substances, as a needle was being advanced. No attempts were made at seeking any paresthesias. Safe injection practices and needle disposal techniques used. Medications properly checked for expiration dates. SDV (single dose vial) medications used. Description of the Procedure: Protocol guidelines were followed. The procedure needle was introduced through the skin, ipsilateral to the reported pain, and advanced to the target area. Bone was contacted and the needle walked caudad, until the lamina was cleared. The epidural space was identified using "loss-of-resistance technique" with 2-3 ml of PF-NaCl  (0.9% NSS), in a 5cc LOR glass syringe. Vitals:   02/16/18 1302 02/16/18 1307 02/16/18 1312 02/16/18 1317  BP: 130/84 (!) 127/96 (!) 128/97 (!) 138/97  Pulse:      Resp: 15 15 15 19   Temp:      TempSrc:      SpO2: 98% 99% 100% 98%  Weight:      Height:        Start Time: 1310 hrs. End Time: 1318 hrs.  Materials:  Needle(s) Type: Epidural needle Gauge: 17G Length: 3.5-in Medication(s): Please see orders for medications and dosing details.  Imaging Guidance (Spinal):  Type of Imaging Technique: Fluoroscopy Guidance (Spinal) Indication(s): Assistance in needle guidance and placement for procedures requiring needle placement in or near specific anatomical locations not easily accessible without such assistance. Exposure Time: Please see nurses notes. Contrast: Before injecting any contrast, we confirmed that the patient did not have an allergy to iodine, shellfish, or radiological contrast. Once satisfactory needle placement was completed at the desired level, radiological contrast was injected. Contrast injected under live fluoroscopy. No contrast complications. See chart for type and volume of contrast used. Fluoroscopic Guidance: I was personally present during the use of fluoroscopy. "Tunnel Vision Technique" used to obtain the best possible view of the target area. Parallax error corrected before commencing the procedure. "Direction-depth-direction" technique used to introduce the needle under continuous pulsed fluoroscopy. Once target was reached, antero-posterior, oblique, and lateral fluoroscopic projection used confirm needle placement in all planes. Images permanently stored in EMR. Interpretation: I personally interpreted the imaging intraoperatively. Adequate needle placement confirmed in multiple planes. Appropriate spread of contrast into desired area was observed. No evidence of afferent or efferent intravascular uptake. No intrathecal or subarachnoid spread observed. Permanent  images saved into the patient's record.  Antibiotic Prophylaxis:   Anti-infectives (From admission, onward)   None     Indication(s): None identified  Post-operative Assessment:  Post-procedure Vital Signs:  Pulse/HCG Rate: 7979 Temp: 98.2 F (36.8 C) Resp: 19 BP: (!) 138/97 SpO2: 98 %  EBL: None  Complications: No immediate post-treatment complications observed by team, or reported by patient.  Note: The patient tolerated the entire procedure well. A repeat set of vitals were taken after the procedure and the patient was kept under observation following institutional policy, for this type of procedure. Post-procedural neurological assessment was performed, showing return to baseline, prior to discharge. The patient was provided with post-procedure discharge instructions, including a section on how to identify potential problems. Should any problems arise concerning this procedure, the patient was given instructions to immediately contact us, at any time, without hesitation. In any case, we plan to contact the patient by telephone for a follow-up status report regarding this interventional procedure.  Comments:  No additional relevant information.  Plan of Care    Imaging Orders     DG C-Arm 1-60 Min-No Report  Procedure Orders     Cervical Epidural Injection  Medications ordered for procedure: Meds ordered this encounter  Medications  . iopamidol (ISOVUE-M) 41 % intrathecal injection 10 mL    Must be Myelogram-compatible. If not available, you may substitute with a water-soluble, non-ionic, hypoallergenic, myelogram-compatible radiological contrast medium.  Marland Kitchen lidocaine (XYLOCAINE) 2 % (with pres) injection 400 mg  . sodium chloride flush (NS) 0.9 % injection 1 mL  . ropivacaine (PF) 2 mg/mL (0.2%) (NAROPIN) injection 1 mL  . dexamethasone (DECADRON) injection 10 mg   Medications administered: We administered iopamidol, lidocaine, sodium chloride flush, ropivacaine (PF)  2 mg/mL (0.2%), and dexamethasone.  See the medical record for exact dosing, route, and time of administration.  New Prescriptions   No medications on file   Disposition: Discharge home  Discharge Date & Time: 02/16/2018; 1400 hrs.   Physician-requested Follow-up: Return for post-procedure eval (2 wks), w/ Dr. Dossie Arbour.  Future Appointments  Date Time Provider [place]  02/24/2018 10:00 AM Kathrine Haddock, NP [facility]  04/12/2018 11:15 AM Vevelyn Francois, NP [facility] None  Primary Care Physician: Kathrine Haddock, NP Location: [facility] Note by: Gaspar Cola, MD Date: 02/16/2018; Time: 1:21 PM  Disclaimer:  Medicine is not an Chief Strategy Officer. The only guarantee in medicine is that nothing is guaranteed. It is important to note that the decision to proceed with this intervention was based on the information collected from the patient. The Data and conclusions were drawn from the patient's questionnaire, the interview, and the physical examination. Because the information was provided in large part by the patient, it cannot be guaranteed that it has not been purposely or unconsciously manipulated. Every effort has been made to obtain as much relevant data as possible for this evaluation. It is important to note that the conclusions that lead to this procedure are derived in large part from the available data. Always take into account that the treatment will also be dependent on availability of resources and existing treatment guidelines, considered by other Pain Management Practitioners as being common knowledge and practice, at the time of the intervention. For Medico-Legal purposes, it is also important to point out that variation in procedural techniques and pharmacological choices are the acceptable norm. The indications, contraindications, technique, and results of the above procedure should only be interpreted and judged by a Board-Certified  Interventional Pain Specialist with extensive familiarity and expertise in the same exact procedure and technique.

## 2018-02-17 ENCOUNTER — Telehealth: Payer: Self-pay

## 2018-02-17 NOTE — Telephone Encounter (Signed)
Post procedure phone call. Patient states he is doing well.  

## 2018-02-24 ENCOUNTER — Ambulatory Visit (INDEPENDENT_AMBULATORY_CARE_PROVIDER_SITE_OTHER): Payer: 59 | Admitting: Unknown Physician Specialty

## 2018-02-24 ENCOUNTER — Encounter: Payer: Self-pay | Admitting: Unknown Physician Specialty

## 2018-02-24 DIAGNOSIS — E785 Hyperlipidemia, unspecified: Secondary | ICD-10-CM

## 2018-02-24 DIAGNOSIS — F325 Major depressive disorder, single episode, in full remission: Secondary | ICD-10-CM

## 2018-02-24 DIAGNOSIS — G894 Chronic pain syndrome: Secondary | ICD-10-CM

## 2018-02-24 DIAGNOSIS — K2970 Gastritis, unspecified, without bleeding: Secondary | ICD-10-CM | POA: Diagnosis not present

## 2018-02-24 DIAGNOSIS — I10 Essential (primary) hypertension: Secondary | ICD-10-CM | POA: Diagnosis not present

## 2018-02-24 MED ORDER — ATORVASTATIN CALCIUM 40 MG PO TABS: 40.0000 mg | ORAL_TABLET | Freq: Every day | ORAL | 1 refills | Status: DC

## 2018-02-24 MED ORDER — ONDANSETRON HCL 8 MG PO TABS: 8.0000 mg | ORAL_TABLET | Freq: Two times a day (BID) | ORAL | 3 refills | Status: DC

## 2018-02-24 MED ORDER — OMEPRAZOLE 20 MG PO CPDR: 20.0000 mg | DELAYED_RELEASE_CAPSULE | Freq: Every day | ORAL | 1 refills | Status: DC

## 2018-02-24 MED ORDER — FENOFIBRATE 160 MG PO TABS: 160.0000 mg | ORAL_TABLET | Freq: Every day | ORAL | 1 refills | Status: DC

## 2018-02-24 MED ORDER — ATENOLOL 50 MG PO TABS: 50.0000 mg | ORAL_TABLET | Freq: Every day | ORAL | 1 refills | Status: DC

## 2018-02-24 MED ORDER — BUSPIRONE HCL 30 MG PO TABS: 30.0000 mg | ORAL_TABLET | Freq: Two times a day (BID) | ORAL | 1 refills | Status: DC

## 2018-02-24 MED ORDER — DULOXETINE HCL 30 MG PO CPEP: 30.0000 mg | ORAL_CAPSULE | Freq: Two times a day (BID) | ORAL | 1 refills | Status: DC

## 2018-02-24 NOTE — Assessment & Plan Note (Signed)
Not currently taking medications.  Restart and recheck in 6-8 weeks

## 2018-02-24 NOTE — Assessment & Plan Note (Signed)
excelllent control.  Continue present medications

## 2018-02-24 NOTE — Assessment & Plan Note (Signed)
Stable, continue present medications.   

## 2018-02-24 NOTE — Progress Notes (Signed)
BP 98/62   Pulse 65   Temp 98.3 F (36.8 C) (Oral)   Ht 5\' 7"  (1.702 m)   Wt 181 lb 11.2 oz (82.4 kg)   SpO2 98%   BMI 28.46 kg/m    Subjective:    Patient ID: Jose Washington, male    DOB: [birth year], 52 y.o.   MRN: [id number]  HPI: Jose Washington is a 52 y.o. male  Chief Complaint  Patient presents with  . Depression  . Hyperlipidemia  . Hypertension   Hypertension  Using medications without difficulty Average home BPs Not checking   Using medication without problems or lightheadedness No chest pain with exertion or shortness of breath No Edema  Elevated Cholesterol Had not taken medication for a couple of weeks due to craziness at work.   No Muscle aches none Diet: Exercise: Not exercising lately besides work.    Depression Good control of depression taking Buspar and Cymbalta Depression screen [facility] 2/9 02/24/2018 02/16/2018 11/15/2017 10/19/2017 09/12/2017  Decreased Interest 0 0 0 0 0  Down, Depressed, Hopeless 0 0 0 0 0  PHQ - 2 Score 0 0 0 0 0  Altered sleeping 0 - - - -  Tired, decreased energy 0 - - - -  Change in appetite 0 - - - -  Feeling bad or failure about yourself  0 - - - -  Trouble concentrating 0 - - - -  Moving slowly or fidgety/restless 0 - - - -  Suicidal thoughts 0 - - - -  PHQ-9 Score 0 - - - -   Gastritis Takes Zofran BID along with Omeprazole.  This controls his symptoms.    Chronic pain Managed by pain clinic.  TENS unit is helping quite a bit.    Relevant past medical, surgical, family and social history reviewed and updated as indicated. Interim medical history since our last visit reviewed. Allergies and medications reviewed and updated.  Review of Systems  Per HPI unless specifically indicated above     Objective:    BP 98/62   Pulse 65   Temp 98.3 F (36.8 C) (Oral)   Ht 5\' 7"  (1.702 m)   Wt 181 lb 11.2 oz (82.4 kg)   SpO2 98%   BMI 28.46 kg/m   Wt Readings from Last 3 Encounters:  02/24/18 181 lb 11.2 oz  (82.4 kg)  02/16/18 184 lb (83.5 kg)  01/20/18 187 lb 9.6 oz (85.1 kg)    Physical Exam  Constitutional: He is oriented to person, place, and time. He appears well-developed and well-nourished. No distress.  HENT:  Head: Normocephalic and atraumatic.  Eyes: Conjunctivae and lids are normal. Right eye exhibits no discharge. Left eye exhibits no discharge. No scleral icterus.  Neck: Normal range of motion. Neck supple. No JVD present. Carotid bruit is not present.  Cardiovascular: Normal rate, regular rhythm and normal heart sounds.  Pulmonary/Chest: Effort normal and breath sounds normal. No respiratory distress.  Abdominal: Normal appearance. There is no splenomegaly or hepatomegaly.  Musculoskeletal: Normal range of motion.  Neurological: He is alert and oriented to person, place, and time.  Skin: Skin is warm, dry and intact. No rash noted. No pallor.  Psychiatric: He has a normal mood and affect. His behavior is normal. Judgment and thought content normal.    Results for orders placed or performed in visit on 01/18/18  ToxASSURE Select 13 (MW), Urine  Result Value Ref Range   Summary FINAL  Assessment & Plan:   Problem List Items Addressed This Visit      Unprioritized   Chronic pain syndrome (Chronic)    Per pain clinic      Essential hypertension, benign    Stable, continue present medications. A bit low normal but no complaints of side-effects       Relevant Medications   atenolol (TENORMIN) 50 MG tablet   atorvastatin (LIPITOR) 40 MG tablet   fenofibrate 160 MG tablet   Gastritis    Stable, continue present medications.        Hyperlipidemia    Not currently taking medications.  Restart and recheck in 6-8 weeks      Relevant Medications   atenolol (TENORMIN) 50 MG tablet   atorvastatin (LIPITOR) 40 MG tablet   fenofibrate 160 MG tablet   Major depression in remission ([place])    excelllent control.  Continue present medications      Relevant  Medications   busPIRone (BUSPAR) 30 MG tablet   DULoxetine (CYMBALTA) 30 MG capsule       Follow up plan: Return for physical.

## 2018-02-24 NOTE — Assessment & Plan Note (Signed)
Stable, continue present medications. A bit low normal but no complaints of side-effects

## 2018-02-24 NOTE — Assessment & Plan Note (Signed)
Per pain clinic 

## 2018-03-13 ENCOUNTER — Ambulatory Visit: Payer: 59 | Attending: Pain Medicine | Admitting: Pain Medicine

## 2018-03-13 ENCOUNTER — Other Ambulatory Visit: Payer: Self-pay

## 2018-03-13 ENCOUNTER — Encounter: Payer: Self-pay | Admitting: Pain Medicine

## 2018-03-13 VITALS — BP 118/80 | HR 65 | Temp 98.4°F | Resp 18 | Ht 67.0 in | Wt 182.0 lb

## 2018-03-13 DIAGNOSIS — K2970 Gastritis, unspecified, without bleeding: Secondary | ICD-10-CM | POA: Diagnosis not present

## 2018-03-13 DIAGNOSIS — M4802 Spinal stenosis, cervical region: Secondary | ICD-10-CM | POA: Diagnosis not present

## 2018-03-13 DIAGNOSIS — I10 Essential (primary) hypertension: Secondary | ICD-10-CM | POA: Diagnosis not present

## 2018-03-13 DIAGNOSIS — M545 Low back pain: Secondary | ICD-10-CM | POA: Diagnosis not present

## 2018-03-13 DIAGNOSIS — Z9889 Other specified postprocedural states: Secondary | ICD-10-CM | POA: Insufficient documentation

## 2018-03-13 DIAGNOSIS — Z823 Family history of stroke: Secondary | ICD-10-CM | POA: Insufficient documentation

## 2018-03-13 DIAGNOSIS — M25511 Pain in right shoulder: Secondary | ICD-10-CM | POA: Diagnosis not present

## 2018-03-13 DIAGNOSIS — F419 Anxiety disorder, unspecified: Secondary | ICD-10-CM | POA: Insufficient documentation

## 2018-03-13 DIAGNOSIS — F329 Major depressive disorder, single episode, unspecified: Secondary | ICD-10-CM | POA: Diagnosis not present

## 2018-03-13 DIAGNOSIS — E785 Hyperlipidemia, unspecified: Secondary | ICD-10-CM | POA: Diagnosis not present

## 2018-03-13 DIAGNOSIS — M25512 Pain in left shoulder: Secondary | ICD-10-CM | POA: Diagnosis not present

## 2018-03-13 DIAGNOSIS — Z79891 Long term (current) use of opiate analgesic: Secondary | ICD-10-CM | POA: Diagnosis not present

## 2018-03-13 DIAGNOSIS — Z79899 Other long term (current) drug therapy: Secondary | ICD-10-CM | POA: Diagnosis not present

## 2018-03-13 DIAGNOSIS — M5412 Radiculopathy, cervical region: Secondary | ICD-10-CM | POA: Diagnosis not present

## 2018-03-13 DIAGNOSIS — G894 Chronic pain syndrome: Secondary | ICD-10-CM | POA: Diagnosis not present

## 2018-03-13 DIAGNOSIS — G8929 Other chronic pain: Secondary | ICD-10-CM | POA: Diagnosis not present

## 2018-03-13 DIAGNOSIS — E559 Vitamin D deficiency, unspecified: Secondary | ICD-10-CM | POA: Insufficient documentation

## 2018-03-13 DIAGNOSIS — M5030 Other cervical disc degeneration, unspecified cervical region: Secondary | ICD-10-CM

## 2018-03-13 DIAGNOSIS — Z833 Family history of diabetes mellitus: Secondary | ICD-10-CM | POA: Insufficient documentation

## 2018-03-13 DIAGNOSIS — Z791 Long term (current) use of non-steroidal anti-inflammatories (NSAID): Secondary | ICD-10-CM | POA: Insufficient documentation

## 2018-03-13 DIAGNOSIS — M542 Cervicalgia: Secondary | ICD-10-CM | POA: Diagnosis not present

## 2018-03-13 DIAGNOSIS — M25562 Pain in left knee: Secondary | ICD-10-CM | POA: Diagnosis not present

## 2018-03-13 DIAGNOSIS — M549 Dorsalgia, unspecified: Secondary | ICD-10-CM | POA: Diagnosis not present

## 2018-03-13 DIAGNOSIS — M47812 Spondylosis without myelopathy or radiculopathy, cervical region: Secondary | ICD-10-CM

## 2018-03-13 NOTE — Progress Notes (Signed)
Safety precautions to be maintained throughout the outpatient stay will include: orient to surroundings, keep bed in low position, maintain call bell within reach at all times, provide assistance with transfer out of bed and ambulation.  

## 2018-03-13 NOTE — Progress Notes (Signed)
Patient's Name: Jose Washington  MRN: [id number]  Referring Provider: Kathrine Haddock, NP  DOB: Mar 28, 1966  PCP: Kathrine Haddock, NP  DOS: 03/13/2018  Note by: Gaspar Cola, MD  Service setting: Ambulatory outpatient  Specialty: Interventional Pain Management  Location: [facility] (AMB) Pain Management Facility    Patient type: Established   Primary Reason(s) for Visit: Encounter for post-procedure evaluation of chronic illness with mild to moderate exacerbation CC: Back Pain (low)  HPI  Jose Washington is a 52 y.o. year old, male patient, who comes today for a post-procedure evaluation. He has Chronic neck pain (Primary Source of Pain) (Bilateral) (L>R); Hyperlipidemia; Essential hypertension, benign; Gastritis; Chronic pain syndrome; Chronic low back pain (Secondary source of pain) (Bilateral) (L>R); Chronic upper back pain [facility] source of pain) (Bilateral) (L>R); Chronic shoulder pain (Bilateral) (L>R); Osteoarthritis of AC (acromioclavicular) joint (Right); Chronic sacroiliac joint pain (Bilateral) (L>R); Long term current use of opiate analgesic; Long term prescription opiate use; Opiate use (5 MME/Day); Muscle spasticity; Cervical DDD (C4-5, C5-6, C6-7 and C7-T1); Cervical foraminal stenosis (Bilateral: C5-6 & C6-7, Left: C4-5 & C7-T1); Cervical radiculitis (Bilateral) (L>R); Vitamin D insufficiency; Cervical facet syndrome (HCC) (L); Cervical spondylosis; Musculoskeletal neck pain (trapezius) (Left); Major depression in remission ([place]); Chronic knee pain (Left); Chronic lower extremity pain (Left); Grade 1 Retrolisthesis of L3 over L4; Lumbar facet arthropathy (Bilateral); Lumbar facet osteoarthritis; Lumbar facet syndrome (Bilateral) (L>R); Lumbar spondylosis; Chronic right shoulder pain; and Suprascapular neuropathy, right on their problem list. His primarily concern today is the Back Pain (low)  Pain Assessment: Location: Lower Back Radiating: denies Onset: More than a month ago Duration:  Chronic pain Quality: Aching, Dull Severity: 1 /10 (subjective, self-reported pain score)  Note: Reported level is compatible with observation.                               Timing: Constant Modifying factors: medications, rest, TENS BP: 118/80  HR: 65  Jose Washington comes in today for post-procedure evaluation after the treatment done on 02/16/2018.  Further details on both, my assessment(s), as well as the proposed treatment plan, please see below.  Post-Procedure Assessment  02/16/2018 Procedure: Diagnostic/therapeutic C7-T1 left-sided interlaminar cervical epidural steroid injection #3 under fluoroscopic guidance, no sedation Pre-procedure pain score:  2/10 Post-procedure pain score: 4/10 No initial benefit, possible due to rapid discharge after no sedation procedure, without enough time to allow full onset of block. Influential Factors: BMI: 28.51 kg/m Intra-procedural challenges: None observed.         Assessment challenges: None detected.              Reported side-effects: None.        Post-procedural adverse reactions or complications: None reported         Sedation: No sedation used. When no sedatives are used, the analgesic levels obtained are directly associated to the effectiveness of the local anesthetics. However, when sedation is provided, the level of analgesia obtained during the initial 1 hour following the intervention, is believed to be the result of a combination of factors. These factors may include, but are not limited to: 1. The effectiveness of the local anesthetics used. 2. The effects of the analgesic(s) and/or anxiolytic(s) used. 3. The degree of discomfort experienced by the patient at the time of the procedure. 4. The patients ability and reliability in recalling and recording the events. 5. The presence and influence of possible secondary gains and/or psychosocial factors.  Reported result: Relief experienced during the 1st hour after the procedure: 100 %  (Ultra-Short Term Relief)            Interpretative annotation: Clinically appropriate result. No IV Analgesic or Anxiolytic given, therefore benefits are completely due to Local Anesthetic effects.          Effects of local anesthetic: The analgesic effects attained during this period are directly associated to the localized infiltration of local anesthetics and therefore cary significant diagnostic value as to the etiological location, or anatomical origin, of the pain. Expected duration of relief is directly dependent on the pharmacodynamics of the local anesthetic used. Long-acting (4-6 hours) anesthetics used.  Reported result: Relief during the next 4 to 6 hour after the procedure: 100 % (Short-Term Relief)            Interpretative annotation: Clinically appropriate result. Analgesia during this period is likely to be Local Anesthetic-related.          Long-term benefit: Defined as the period of time past the expected duration of local anesthetics (1 hour for short-acting and 4-6 hours for long-acting). With the possible exception of prolonged sympathetic blockade from the local anesthetics, benefits during this period are typically attributed to, or associated with, other factors such as analgesic sensory neuropraxia, antiinflammatory effects, or beneficial biochemical changes provided by agents other than the local anesthetics.  Reported result: Extended relief following procedure: 100 % (Long-Term Relief)            Interpretative annotation: Clinically appropriate result. Good relief. No permanent benefit expected. Inflammation plays a part in the etiology to the pain.          Current benefits: Defined as reported results that persistent at this point in time.   Analgesia: 100 %            Function: Jose Washington reports improvement in function ROM: Jose Washington reports improvement in ROM Interpretative annotation: Ongoing benefit. Therapeutic success. Effective therapeutic approach. Benefit  could be steroid-related.  Interpretation: Results would suggest a successful diagnostic intervention.                  Plan:  Set up procedure as a PRN palliative treatment option for this patient.                Laboratory Chemistry  Inflammation Markers (CRP: Acute Phase) (ESR: Chronic Phase) Lab Results  Component Value Date   CRP 1.4 (H) 11/12/2016   ESRSEDRATE 3 11/12/2016                         Rheumatology Markers Lab Results  Component Value Date   LABURIC 5.6 07/30/2015                        Renal Function Markers Lab Results  Component Value Date   BUN 14 02/21/2017   CREATININE 1.15 02/21/2017   GFRAA 85 02/21/2017   GFRNONAA 74 02/21/2017                              Hepatic Function Markers Lab Results  Component Value Date   AST 35 02/21/2017   ALT 53 (H) 02/21/2017   ALBUMIN 4.9 02/21/2017   ALKPHOS 56 02/21/2017                        Electrolytes  Lab Results  Component Value Date   NA 146 (H) 02/21/2017   K 4.7 02/21/2017   CL 106 02/21/2017   CALCIUM 9.8 02/21/2017   MG 2.0 11/12/2016                        Neuropathy Markers Lab Results  Component Value Date   VITAMINB12 820 11/12/2016                        Bone Pathology Markers Lab Results  Component Value Date   25OHVITD1 23 (L) 11/12/2016   25OHVITD2 <1.0 11/12/2016   25OHVITD3 23 11/12/2016                         Coagulation Parameters Lab Results  Component Value Date   PLT 389 (H) 02/21/2017                        Cardiovascular Markers Lab Results  Component Value Date   HGB 15.4 02/21/2017   HCT 44.5 02/21/2017                         Note: Lab results reviewed.  Recent Diagnostic Imaging Results  DG C-Arm 1-60 Min-No Report Fluoroscopy was utilized by the requesting physician.  No radiographic  interpretation.   Complexity Note: I personally reviewed the fluoroscopic imaging of the procedure.                        Meds   Current Outpatient  Medications:  .  acetaminophen (TYLENOL) 500 MG tablet, Take 500 mg by mouth every 6 (six) hours as needed., Disp: , Rfl:  .  albuterol (PROVENTIL HFA;VENTOLIN HFA) 108 (90 Base) MCG/ACT inhaler, Inhale 2 puffs into the lungs every 6 (six) hours as needed for wheezing or shortness of breath., Disp: 1 Inhaler, Rfl: 0 .  atenolol (TENORMIN) 50 MG tablet, Take 1 tablet (50 mg total) by mouth daily., Disp: 90 tablet, Rfl: 1 .  atorvastatin (LIPITOR) 40 MG tablet, Take 1 tablet (40 mg total) by mouth daily., Disp: 90 tablet, Rfl: 1 .  baclofen (LIORESAL) 10 MG tablet, Take 1 tablet (10 mg total) by mouth at bedtime., Disp: 90 tablet, Rfl: 0 .  busPIRone (BUSPAR) 30 MG tablet, Take 1 tablet (30 mg total) by mouth 2 (two) times daily., Disp: 180 tablet, Rfl: 1 .  Calcium Carbonate (CALCIUM 600 PO), Take 600 mg by mouth daily., Disp: , Rfl:  .  Cholecalciferol (VITAMIN D3) 1000 units CAPS, Take 1 capsule by mouth daily., Disp: , Rfl:  .  Chondroitin Sulfate 400 MG CAPS, Take by mouth 2 (two) times daily., Disp: , Rfl:  .  desloratadine-pseudoephedrine (CLARINEX-D 12-HOUR) 2.5-120 MG 12 hr tablet, Take 1 tablet by mouth as needed., Disp: , Rfl:  .  DiphenhydrAMINE HCl (DIPHEDRYL ALLERGY PO), Take 25 mg by mouth daily., Disp: , Rfl:  .  DULoxetine (CYMBALTA) 30 MG capsule, Take 1 capsule (30 mg total) by mouth 2 (two) times daily., Disp: 180 capsule, Rfl: 1 .  fenofibrate 160 MG tablet, Take 1 tablet (160 mg total) by mouth daily., Disp: 90 tablet, Rfl: 1 .  fluticasone (FLONASE) 50 MCG/ACT nasal spray, Place 2 sprays into both nostrils 2 (two) times daily., Disp: , Rfl:  .  Glucosamine-Chondroit-Vit C-Mn (GLUCOSAMINE 1500 COMPLEX PO), Take  by mouth 2 (two) times daily., Disp: , Rfl:  .  [START ON 03/25/2018] HYDROcodone-acetaminophen (NORCO/VICODIN) 5-325 MG tablet, Take 1 tablet by mouth daily as needed for moderate pain., Disp: 15 tablet, Rfl: 0 .  HYDROcodone-acetaminophen (NORCO/VICODIN) 5-325 MG tablet,  Take 1 tablet by mouth daily as needed for moderate pain., Disp: 15 tablet, Rfl: 0 .  ibuprofen (ADVIL,MOTRIN) 200 MG tablet, Take 200 mg by mouth 3 (three) times a week., Disp: , Rfl:  .  magnesium oxide (MAG-OX) 400 MG tablet, Take 400 mg by mouth daily., Disp: , Rfl:  .  Melatonin 5 MG TABS, Take by mouth at bedtime. , Disp: , Rfl:  .  Methylsulfonylmethane (MSM PO), Take by mouth 2 (two) times daily., Disp: , Rfl:  .  Multiple Vitamin (MULTIVITAMIN) tablet, Take 1 tablet by mouth daily., Disp: , Rfl:  .  omeprazole (PRILOSEC) 20 MG capsule, Take 1 capsule (20 mg total) by mouth daily., Disp: 90 capsule, Rfl: 1 .  ondansetron (ZOFRAN) 8 MG tablet, Take 1 tablet (8 mg total) by mouth 2 (two) times daily., Disp: 180 tablet, Rfl: 3 .  Potassium 99 MG TABS, Take 1 tablet by mouth., Disp: , Rfl:  .  vitamin B-12 (CYANOCOBALAMIN) 1000 MCG tablet, Take 1,000 mcg by mouth daily., Disp: , Rfl:  .  HYDROcodone-acetaminophen (NORCO/VICODIN) 5-325 MG tablet, Take 1 tablet by mouth daily as needed for moderate pain., Disp: 15 tablet, Rfl: 0  ROS  Constitutional: Denies any fever or chills Gastrointestinal: No reported hemesis, hematochezia, vomiting, or acute GI distress Musculoskeletal: Denies any acute onset joint swelling, redness, loss of ROM, or weakness Neurological: No reported episodes of acute onset apraxia, aphasia, dysarthria, agnosia, amnesia, paralysis, loss of coordination, or loss of consciousness  Allergies  Mr. Torrez has No Known Allergies.  [place]  Drug: Mr. Dady  reports that he does not use drugs. Alcohol:  reports that he does not drink alcohol. Tobacco:  reports that he has never smoked. He has never used smokeless tobacco. Medical:  has a past medical history of Allergy, Anxiety, Chronic duodenal ulcer with hemorrhage (2012), Chronic neck pain, Depression, Hyperlipidemia, Hypertension, and Microscopic hematuria. Surgical: Mr. Daughtridge  has a past surgical history that  includes eye muscle repair [phone] and 1975) and bLEEDING ULCER (2012). Family: family history includes Allergies in his brother; Cancer in his maternal grandfather and mother; Dementia in his father and paternal grandfather; Depression in his father; Diabetes in his mother; Mental illness in his father; Stroke in his paternal grandfather.  Constitutional Exam  General appearance: Well nourished, well developed, and well hydrated. In no apparent acute distress Vitals:   03/13/18 1156  BP: 118/80  Pulse: 65  Resp: 18  Temp: 98.4 F (36.9 C)  TempSrc: Oral  SpO2: 99%  Weight: 182 lb (82.6 kg)  Height: '5\' 7"'  (1.702 m)   BMI Assessment: Estimated body mass index is 28.51 kg/m as calculated from the following:   Height as of this encounter: '5\' 7"'  (1.702 m).   Weight as of this encounter: 182 lb (82.6 kg).  BMI interpretation table: BMI level Category Range association with higher incidence of chronic pain  <18 kg/m2 Underweight   18.5-24.9 kg/m2 Ideal body weight   25-29.9 kg/m2 Overweight Increased incidence by 20%  30-34.9 kg/m2 Obese (Class I) Increased incidence by 68%  35-39.9 kg/m2 Severe obesity (Class II) Increased incidence by 136%  >40 kg/m2 Extreme obesity (Class III) Increased incidence by 254%   Patient's current BMI Ideal  Body weight  Body mass index is 28.51 kg/m. Ideal body weight: 66.1 kg (145 lb 11.6 oz) Adjusted ideal body weight: 72.7 kg (160 lb 3.8 oz)   BMI Readings from Last 4 Encounters:  03/13/18 28.51 kg/m  02/24/18 28.46 kg/m  02/16/18 28.82 kg/m  01/20/18 29.38 kg/m   Wt Readings from Last 4 Encounters:  03/13/18 182 lb (82.6 kg)  02/24/18 181 lb 11.2 oz (82.4 kg)  02/16/18 184 lb (83.5 kg)  01/20/18 187 lb 9.6 oz (85.1 kg)  Psych/Mental status: Alert, oriented x 3 (person, place, & time)       Eyes: PERLA Respiratory: No evidence of acute respiratory distress  Cervical Spine Area Exam  Skin & Axial Inspection: No masses, redness, edema,  swelling, or associated skin lesions Alignment: Symmetrical Functional ROM: Unrestricted ROM      Stability: No instability detected Muscle Tone/Strength: Functionally intact. No obvious neuro-muscular anomalies detected. Sensory (Neurological): Unimpaired Palpation: No palpable anomalies              Upper Extremity (UE) Exam    Side: Right upper extremity  Side: Left upper extremity  Skin & Extremity Inspection: Skin color, temperature, and hair growth are WNL. No peripheral edema or cyanosis. No masses, redness, swelling, asymmetry, or associated skin lesions. No contractures.  Skin & Extremity Inspection: Skin color, temperature, and hair growth are WNL. No peripheral edema or cyanosis. No masses, redness, swelling, asymmetry, or associated skin lesions. No contractures.  Functional ROM: Unrestricted ROM          Functional ROM: Unrestricted ROM          Muscle Tone/Strength: Functionally intact. No obvious neuro-muscular anomalies detected.  Muscle Tone/Strength: Functionally intact. No obvious neuro-muscular anomalies detected.  Sensory (Neurological): Unimpaired          Sensory (Neurological): Unimpaired          Palpation: No palpable anomalies              Palpation: No palpable anomalies              Specialized Test(s): Deferred         Specialized Test(s): Deferred          Thoracic Spine Area Exam  Skin & Axial Inspection: No masses, redness, or swelling Alignment: Symmetrical Functional ROM: Unrestricted ROM Stability: No instability detected Muscle Tone/Strength: Functionally intact. No obvious neuro-muscular anomalies detected. Sensory (Neurological): Unimpaired Muscle strength & Tone: No palpable anomalies  Lumbar Spine Area Exam  Skin & Axial Inspection: No masses, redness, or swelling Alignment: Symmetrical Functional ROM: Unrestricted ROM       Stability: No instability detected Muscle Tone/Strength: Functionally intact. No obvious neuro-muscular anomalies  detected. Sensory (Neurological): Unimpaired Palpation: No palpable anomalies       Provocative Tests: Lumbar Hyperextension and rotation test: evaluation deferred today       Lumbar Lateral bending test: evaluation deferred today       Patrick's Maneuver: evaluation deferred today                    Gait & Posture Assessment  Ambulation: Unassisted Gait: Relatively normal for age and body habitus Posture: WNL   Lower Extremity Exam    Side: Right lower extremity  Side: Left lower extremity  Stability: No instability observed          Stability: No instability observed          Skin & Extremity Inspection: Skin color, temperature, and hair  growth are WNL. No peripheral edema or cyanosis. No masses, redness, swelling, asymmetry, or associated skin lesions. No contractures.  Skin & Extremity Inspection: Skin color, temperature, and hair growth are WNL. No peripheral edema or cyanosis. No masses, redness, swelling, asymmetry, or associated skin lesions. No contractures.  Functional ROM: Unrestricted ROM                  Functional ROM: Unrestricted ROM                  Muscle Tone/Strength: Functionally intact. No obvious neuro-muscular anomalies detected.  Muscle Tone/Strength: Functionally intact. No obvious neuro-muscular anomalies detected.  Sensory (Neurological): Unimpaired  Sensory (Neurological): Unimpaired  Palpation: No palpable anomalies  Palpation: No palpable anomalies   Assessment  Primary Diagnosis & Pertinent Problem List: The primary encounter diagnosis was Chronic neck pain (Primary Source of Pain) (Bilateral) (L>R). Diagnoses of Cervical DDD (C4-5, C5-6, C6-7 and C7-T1), Cervical facet syndrome (HCC) (L), Cervical radiculitis (Bilateral) (L>R), Chronic low back pain (Secondary source of pain) (Bilateral) (L>R), and Chronic upper back pain [facility] source of pain) (Bilateral) (L>R) were also pertinent to this visit.  Status Diagnosis  Controlled Controlled Controlled 1.  Chronic neck pain (Primary Source of Pain) (Bilateral) (L>R)   2. Cervical DDD (C4-5, C5-6, C6-7 and C7-T1)   3. Cervical facet syndrome (HCC) (L)   4. Cervical radiculitis (Bilateral) (L>R)   5. Chronic low back pain (Secondary source of pain) (Bilateral) (L>R)   6. Chronic upper back pain [facility] source of pain) (Bilateral) (L>R)     Problems updated and reviewed during this visit: No problems updated. Plan of Care  Pharmacotherapy (Medications Ordered): No orders of the defined types were placed in this encounter.  Medications administered today: Sallee Lange. Ahlers had no medications administered during this visit.  Procedure Orders    No procedure(s) ordered today   Lab Orders  No laboratory test(s) ordered today   Imaging Orders  No imaging studies ordered today   Referral Orders  No referral(s) requested today    Interventional management options: Planned, scheduled, and/or pending:   None at this time.   Considering:   Diagnostic bilateral lumbar facetblock  Possible bilateral lumbar facet RFA Palliative left-sided cervical epidural steroid injection#3 Diagnostic bilateral cervical facetblock  Possible bilateral cervical facet RFA Diagnostic bilateral sacroiliac joint block Possible bilateral sacroiliac joint RFA Diagnostic trigger point injections   Palliative PRN treatment(s):   Diagnostic left L4-5 interlaminar LESI #2 Palliative left cervical epidural steroid injection #4    Provider-requested follow-up: No follow-ups on file.  Future Appointments  Date Time Provider [place]  04/10/2018  9:00 AM Kathrine Haddock, NP [facility]  04/12/2018 11:15 AM Vevelyn Francois, NP [facility] None   Primary Care Physician: Kathrine Haddock, NP Location: [facility] Facility Note by: Gaspar Cola, MD Date: 03/13/2018; Time: 1:28 PM

## 2018-04-10 ENCOUNTER — Encounter: Payer: Self-pay | Admitting: Unknown Physician Specialty

## 2018-04-10 ENCOUNTER — Ambulatory Visit (INDEPENDENT_AMBULATORY_CARE_PROVIDER_SITE_OTHER): Payer: 59 | Admitting: Unknown Physician Specialty

## 2018-04-10 VITALS — BP 116/74 | HR 65 | Temp 98.4°F | Ht 65.6 in | Wt 182.4 lb

## 2018-04-10 DIAGNOSIS — I10 Essential (primary) hypertension: Secondary | ICD-10-CM

## 2018-04-10 DIAGNOSIS — Z0000 Encounter for general adult medical examination without abnormal findings: Secondary | ICD-10-CM

## 2018-04-10 DIAGNOSIS — K2970 Gastritis, unspecified, without bleeding: Secondary | ICD-10-CM | POA: Diagnosis not present

## 2018-04-10 DIAGNOSIS — E785 Hyperlipidemia, unspecified: Secondary | ICD-10-CM

## 2018-04-10 DIAGNOSIS — F325 Major depressive disorder, single episode, in full remission: Secondary | ICD-10-CM

## 2018-04-10 NOTE — Patient Instructions (Signed)
Preventive Care 40-64 Years, Male Preventive care refers to lifestyle choices and visits with your health care provider that can promote health and wellness. What does preventive care include?  A yearly physical exam. This is also called an annual well check.  Dental exams once or twice a year.  Routine eye exams. Ask your health care provider how often you should have your eyes checked.  Personal lifestyle choices, including: ? Daily care of your teeth and gums. ? Regular physical activity. ? Eating a healthy diet. ? Avoiding tobacco and drug use. ? Limiting alcohol use. ? Practicing safe sex. ? Taking low-dose aspirin every day starting at age 52. What happens during an annual well check? The services and screenings done by your health care provider during your annual well check will depend on your age, overall health, lifestyle risk factors, and family history of disease. Counseling Your health care provider may ask you questions about your:  Alcohol use.  Tobacco use.  Drug use.  Emotional well-being.  Home and relationship well-being.  Sexual activity.  Eating habits.  Work and work Statistician.  Screening You may have the following tests or measurements:  Height, weight, and BMI.  Blood pressure.  Lipid and cholesterol levels. These may be checked every 5 years, or more frequently if you are over 52 years old.  Skin check.  Lung cancer screening. You may have this screening every year starting at age 52 if you have a 30-pack-year history of smoking and currently smoke or have quit within the past 15 years.  Fecal occult blood test (FOBT) of the stool. You may have this test every year starting at age 52.  Flexible sigmoidoscopy or colonoscopy. You may have a sigmoidoscopy every 5 years or a colonoscopy every 10 years starting at age 52.  Prostate cancer screening. Recommendations will vary depending on your family history and other risks.  Hepatitis C  blood test.  Hepatitis B blood test.  Sexually transmitted disease (STD) testing.  Diabetes screening. This is done by checking your blood sugar (glucose) after you have not eaten for a while (fasting). You may have this done every 1-3 years.  Discuss your test results, treatment options, and if necessary, the need for more tests with your health care provider. Vaccines Your health care provider may recommend certain vaccines, such as:  Influenza vaccine. This is recommended every year.  Tetanus, diphtheria, and acellular pertussis (Tdap, Td) vaccine. You may need a Td booster every 10 years.  Varicella vaccine. You may need this if you have not been vaccinated.  Zoster vaccine. You may need this after age 52.  Measles, mumps, and rubella (MMR) vaccine. You may need at least one dose of MMR if you were born in 1957 or later. You may also need a second dose.  Pneumococcal 13-valent conjugate (PCV13) vaccine. You may need this if you have certain conditions and have not been vaccinated.  Pneumococcal polysaccharide (PPSV23) vaccine. You may need one or two doses if you smoke cigarettes or if you have certain conditions.  Meningococcal vaccine. You may need this if you have certain conditions.  Hepatitis A vaccine. You may need this if you have certain conditions or if you travel or work in places where you may be exposed to hepatitis A.  Hepatitis B vaccine. You may need this if you have certain conditions or if you travel or work in places where you may be exposed to hepatitis B.  Haemophilus influenzae type b (Hib) vaccine.  You may need this if you have certain risk factors.  Talk to your health care provider about which screenings and vaccines you need and how often you need them. This information is not intended to replace advice given to you by your health care provider. Make sure you discuss any questions you have with your health care provider. Document Released: 11/14/2015  Document Revised: 07/07/2016 Document Reviewed: 08/19/2015 Elsevier Interactive Patient Education  [employer].

## 2018-04-10 NOTE — Progress Notes (Signed)
BP 116/74   Pulse 65   Temp 98.4 F (36.9 C) (Oral)   Ht 5' 5.6" (1.666 m)   Wt 182 lb 6.4 oz (82.7 kg)   SpO2 97%   BMI 29.80 kg/m    Subjective:    Patient ID: Jose Washington, male    DOB: December 06, 1965, 52 y.o.   MRN: [id number]  HPI: Jose Washington is a 52 y.o. male  Chief Complaint  Patient presents with  . Annual Exam    Depression/anxiety Buspar and Cymbalta combination working well.   Depression screen [facility] 2/9 04/10/2018 03/13/2018 02/24/2018 02/16/2018 11/15/2017  Decreased Interest 0 0 0 0 0  Down, Depressed, Hopeless 0 0 0 0 0  PHQ - 2 Score 0 0 0 0 0  Altered sleeping 0 - 0 - -  Tired, decreased energy 0 - 0 - -  Change in appetite 0 - 0 - -  Feeling bad or failure about yourself  0 - 0 - -  Trouble concentrating 0 - 0 - -  Moving slowly or fidgety/restless 0 - 0 - -  Suicidal thoughts 0 - 0 - -  PHQ-9 Score 0 - 0 - -   Chronic back and neck pain Gong to the pain clinic.  Home TENS unit is working well.    Hypertension Using medications without difficulty Average home BPs Less than 120/80   No problems or lightheadedness No chest pain with exertion or shortness of breath No Edema  Hyperlipidemia Using medications without problems Diet compliance: Needs improvememt Exercise: walking and sit-ups  Relevant past medical, surgical, family and social history reviewed and updated as indicated. Interim medical history since our last visit reviewed. Allergies and medications reviewed and updated.  Review of Systems  Per HPI unless specifically indicated above     Objective:    BP 116/74   Pulse 65   Temp 98.4 F (36.9 C) (Oral)   Ht 5' 5.6" (1.666 m)   Wt 182 lb 6.4 oz (82.7 kg)   SpO2 97%   BMI 29.80 kg/m   Wt Readings from Last 3 Encounters:  04/10/18 182 lb 6.4 oz (82.7 kg)  03/13/18 182 lb (82.6 kg)  02/24/18 181 lb 11.2 oz (82.4 kg)    Physical Exam  Constitutional: He is oriented to person, place, and time. He appears  well-developed and well-nourished.  HENT:  Head: Normocephalic.  Right Ear: Tympanic membrane, external ear and ear canal normal.  Left Ear: Tympanic membrane, external ear and ear canal normal.  Mouth/Throat: Uvula is midline, oropharynx is clear and moist and mucous membranes are normal.  Eyes: Pupils are equal, round, and reactive to light.  Cardiovascular: Normal rate, regular rhythm and normal heart sounds. Exam reveals no gallop and no friction rub.  No murmur heard. Pulmonary/Chest: Effort normal and breath sounds normal. No respiratory distress.  Abdominal: Soft. Bowel sounds are normal. He exhibits no distension. There is no tenderness.  Musculoskeletal: Normal range of motion.  Neurological: He is alert and oriented to person, place, and time. He has normal reflexes.  Skin: Skin is warm and dry.  Psychiatric: He has a normal mood and affect. His behavior is normal. Judgment and thought content normal.    Results for orders placed or performed in visit on 01/18/18  ToxASSURE Select 13 (MW), Urine  Result Value Ref Range   Summary FINAL       Assessment & Plan:   Problem List Items Addressed This Visit  Unprioritized   Depression, major, single episode, complete remission ([place])    Doing well on current treatment with Cymbalta and Buspar      Essential hypertension, benign    Stable, continue present medications.        Gastritis    Controlled with Omeprazole and Zofran      Hyperlipidemia    Taking Fenofibrate.  Tolerating well.  Check labs today       Other Visit Diagnoses    Healthcare maintenance    -  Primary   Relevant Orders   HIV antibody   Lipid Panel w/o Chol/HDL Ratio   CBC with Differential/Platelet   Comprehensive metabolic panel   TSH   PSA      HM Has cologuard at home.  Planning to send in.  f  Follow up plan: Return in about 6 months (around 10/10/2018).

## 2018-04-10 NOTE — Assessment & Plan Note (Signed)
Controlled with Omeprazole and Zofran

## 2018-04-10 NOTE — Assessment & Plan Note (Signed)
Doing well on current treatment with Cymbalta and Buspar

## 2018-04-10 NOTE — Assessment & Plan Note (Signed)
Stable, continue present medications.   

## 2018-04-10 NOTE — Assessment & Plan Note (Signed)
Taking Fenofibrate.  Tolerating well.  Check labs today

## 2018-04-11 ENCOUNTER — Encounter: Payer: Self-pay | Admitting: Unknown Physician Specialty

## 2018-04-11 LAB — CBC WITH DIFFERENTIAL/PLATELET
BASOS: 1 %
Basophils Absolute: 0 10*3/uL (ref 0.0–0.2)
EOS (ABSOLUTE): 0.6 10*3/uL — AB (ref 0.0–0.4)
Eos: 9 %
HEMOGLOBIN: 15 g/dL (ref 13.0–17.7)
Hematocrit: 44.5 % (ref 37.5–51.0)
IMMATURE GRANS (ABS): 0 10*3/uL (ref 0.0–0.1)
Immature Granulocytes: 1 %
LYMPHS ABS: 1.8 10*3/uL (ref 0.7–3.1)
LYMPHS: 28 %
MCH: 31 pg (ref 26.6–33.0)
MCHC: 33.7 g/dL (ref 31.5–35.7)
MCV: 92 fL (ref 79–97)
Monocytes Absolute: 0.6 10*3/uL (ref 0.1–0.9)
Monocytes: 10 %
NEUTROS ABS: 3.4 10*3/uL (ref 1.4–7.0)
Neutrophils: 51 %
PLATELETS: 346 10*3/uL (ref 150–450)
RBC: 4.84 x10E6/uL (ref 4.14–5.80)
RDW: 13.6 % (ref 12.3–15.4)
WBC: 6.5 10*3/uL (ref 3.4–10.8)

## 2018-04-11 LAB — COMPREHENSIVE METABOLIC PANEL
A/G RATIO: 2.1 (ref 1.2–2.2)
ALBUMIN: 4.4 g/dL (ref 3.5–5.5)
ALT: 36 IU/L (ref 0–44)
AST: 29 IU/L (ref 0–40)
Alkaline Phosphatase: 48 IU/L (ref 39–117)
BILIRUBIN TOTAL: 0.6 mg/dL (ref 0.0–1.2)
BUN / CREAT RATIO: 13 (ref 9–20)
BUN: 15 mg/dL (ref 6–24)
CHLORIDE: 101 mmol/L (ref 96–106)
CO2: 25 mmol/L (ref 20–29)
Calcium: 9.5 mg/dL (ref 8.7–10.2)
Creatinine, Ser: 1.13 mg/dL (ref 0.76–1.27)
GFR calc Af Amer: 87 mL/min/{1.73_m2} (ref >59)
GFR calc non Af Amer: 75 mL/min/{1.73_m2} (ref >59)
Globulin, Total: 2.1 g/dL (ref 1.5–4.5)
Glucose: 87 mg/dL (ref 65–99)
POTASSIUM: 4.2 mmol/L (ref 3.5–5.2)
Sodium: 141 mmol/L (ref 134–144)
Total Protein: 6.5 g/dL (ref 6.0–8.5)

## 2018-04-11 LAB — PSA: Prostate Specific Ag, Serum: 1.2 ng/mL (ref 0.0–4.0)

## 2018-04-11 LAB — LIPID PANEL W/O CHOL/HDL RATIO
CHOLESTEROL TOTAL: 154 mg/dL (ref 100–199)
HDL: 39 mg/dL — ABNORMAL LOW (ref >39)
LDL Calculated: 81 mg/dL (ref 0–99)
TRIGLYCERIDES: 172 mg/dL — AB (ref 0–149)
VLDL Cholesterol Cal: 34 mg/dL (ref 5–40)

## 2018-04-11 LAB — TSH: TSH: 1.92 u[IU]/mL (ref 0.450–4.500)

## 2018-04-11 LAB — HIV ANTIBODY (ROUTINE TESTING W REFLEX): HIV SCREEN 4TH GENERATION: NONREACTIVE

## 2018-04-12 ENCOUNTER — Ambulatory Visit: Payer: 59 | Attending: Nurse Practitioner | Admitting: Nurse Practitioner

## 2018-04-12 ENCOUNTER — Encounter: Payer: Self-pay | Admitting: Nurse Practitioner

## 2018-04-12 ENCOUNTER — Other Ambulatory Visit: Payer: Self-pay

## 2018-04-12 VITALS — BP 124/82 | HR 68 | Temp 97.8°F | Resp 16 | Ht 67.0 in | Wt 182.0 lb

## 2018-04-12 DIAGNOSIS — M25511 Pain in right shoulder: Secondary | ICD-10-CM | POA: Insufficient documentation

## 2018-04-12 DIAGNOSIS — M25562 Pain in left knee: Secondary | ICD-10-CM | POA: Diagnosis not present

## 2018-04-12 DIAGNOSIS — Z5181 Encounter for therapeutic drug level monitoring: Secondary | ICD-10-CM | POA: Insufficient documentation

## 2018-04-12 DIAGNOSIS — M62838 Other muscle spasm: Secondary | ICD-10-CM

## 2018-04-12 DIAGNOSIS — M47816 Spondylosis without myelopathy or radiculopathy, lumbar region: Secondary | ICD-10-CM | POA: Insufficient documentation

## 2018-04-12 DIAGNOSIS — M47812 Spondylosis without myelopathy or radiculopathy, cervical region: Secondary | ICD-10-CM | POA: Insufficient documentation

## 2018-04-12 DIAGNOSIS — G8929 Other chronic pain: Secondary | ICD-10-CM | POA: Diagnosis not present

## 2018-04-12 DIAGNOSIS — G894 Chronic pain syndrome: Secondary | ICD-10-CM

## 2018-04-12 DIAGNOSIS — M545 Low back pain: Secondary | ICD-10-CM | POA: Diagnosis not present

## 2018-04-12 DIAGNOSIS — M533 Sacrococcygeal disorders, not elsewhere classified: Secondary | ICD-10-CM | POA: Diagnosis not present

## 2018-04-12 MED ORDER — BACLOFEN 10 MG PO TABS: 10.0000 mg | ORAL_TABLET | Freq: Every day | ORAL | 0 refills | Status: DC

## 2018-04-12 MED ORDER — HYDROCODONE-ACETAMINOPHEN 5-325 MG PO TABS: 1.0000 | ORAL_TABLET | Freq: Every day | ORAL | 0 refills | Status: DC | PRN

## 2018-04-12 NOTE — Progress Notes (Signed)
Patient's Name: Jose Washington  MRN: [id number]  Referring Provider: Kathrine Haddock, NP  DOB: 1965-12-21  PCP: Kathrine Haddock, NP  DOS: 04/12/2018  Note by: Vevelyn Francois NP  Service setting: Ambulatory outpatient  Specialty: Interventional Pain Management  Location: [facility] (AMB) Pain Management Facility    Patient type: Established    Primary Reason(s) for Visit: Encounter for prescription drug management. (Level of risk: moderate)  CC: Neck Pain (lower; left side is worse) and Back Pain (lower; left side is worse)  HPI  Jose Washington is a 52 y.o. year old, male patient, who comes today for a medication management evaluation. He has Chronic neck pain (Primary Source of Pain) (Bilateral) (L>R); Hyperlipidemia; Essential hypertension, benign; Gastritis; Chronic pain syndrome; Chronic low back pain (Secondary source of pain) (Bilateral) (L>R); Chronic upper back pain [facility] source of pain) (Bilateral) (L>R); Chronic shoulder pain (Bilateral) (L>R); Osteoarthritis of AC (acromioclavicular) joint (Right); Chronic sacroiliac joint pain (Bilateral) (L>R); Long term current use of opiate analgesic; Long term prescription opiate use; Opiate use (5 MME/Day); Muscle spasticity; Cervical DDD (C4-5, C5-6, C6-7 and C7-T1); Cervical foraminal stenosis (Bilateral: C5-6 & C6-7, Left: C4-5 & C7-T1); Cervical radiculitis (Bilateral) (L>R); Vitamin D insufficiency; Cervical facet syndrome (HCC) (L); Cervical spondylosis; Musculoskeletal neck pain (trapezius) (Left); Major depression in remission ([place]); Chronic knee pain (Left); Chronic lower extremity pain (Left); Grade 1 Retrolisthesis of L3 over L4; Lumbar facet arthropathy (Bilateral); Lumbar facet osteoarthritis; Lumbar facet syndrome (Bilateral) (L>R); Lumbar spondylosis; Chronic right shoulder pain; Suprascapular neuropathy, right; and Depression, major, single episode, complete remission (HCC) on their problem list. His primarily concern today is the Neck Pain  (lower; left side is worse) and Back Pain (lower; left side is worse)  Pain Assessment: Location: Lower Neck Radiating: denies Onset: More than a month ago Duration: Chronic pain Quality: Constant, Aching, Dull Severity: 2 /10 (subjective, self-reported pain score)  Note: Reported level is compatible with observation.                          Effect on ADL: slower at work Timing: Constant Modifying factors: medications, rest, TENS BP: 124/82  HR: 68  Jose Washington was last scheduled for an appointment on 01/18/2018 for medication management. During today's appointment we reviewed Jose Washington chronic pain status, as well as his outpatient medication regimen. He admits that the neck pain is constant where the back pain comes and goes. He admits that he last CESI. He admits that he has to alternate the procedures to maximize the benefit.  He admits that he had a dental extraction and was given Norco. He denies any side effects of his medication.   The patient  reports that he does not use drugs. His body mass index is 28.51 kg/m.  Further details on both, my assessment(s), as well as the proposed treatment plan, please see below.  Controlled Substance Pharmacotherapy Assessment REMS (Risk Evaluation and Mitigation Strategy)  Analgesic:Hydrocodone/APAP 5/325 one daily ME/day:7.27m/day   WRise Patience RN  04/12/2018 11:57 AM  Sign at close encounter Nursing Pain Medication Assessment:  Safety precautions to be maintained throughout the outpatient stay will include: orient to surroundings, keep bed in low position, maintain call bell within reach at all times, provide assistance with transfer out of bed and ambulation.  Medication Inspection Compliance: Pill count conducted under aseptic conditions, in front of the patient. Neither the pills nor the bottle was removed from the patient's sight at any time. Once  count was completed pills were immediately returned to the patient in their  original bottle.  Medication: Hydrocodone/APAP Pill/Patch Count: 4 of 15 pills remain Pill/Patch Appearance: Markings consistent with prescribed medication Bottle Appearance: Standard pharmacy container. Clearly labeled. Filled Date: 5 / 28 / 2019 Last Medication intake:  Yesterday   Pharmacokinetics: Liberation and absorption (onset of action): WNL Distribution (time to peak effect): WNL Metabolism and excretion (duration of action): WNL         Pharmacodynamics: Desired effects: Analgesia: Mr. Senn reports >50% benefit. Functional ability: Patient reports that medication allows him to accomplish basic ADLs Clinically meaningful improvement in function (CMIF): Sustained CMIF goals met Perceived effectiveness: Described as relatively effective, allowing for increase in activities of daily living (ADL) Undesirable effects: Side-effects or Adverse reactions: None reported Monitoring: [employer] PMP: Online review of the past 72-monthperiod conducted. Compliant with practice rules and regulations Last UDS on record: Summary  Date Value Ref Range Status  01/18/2018 FINAL  Final    Comment:    ==================================================================== TOXASSURE SELECT 13 (MW) ==================================================================== Test                             Result       Flag       Units Drug Present and Declared for Prescription Verification   Hydrocodone                    311          EXPECTED   ng/mg creat   Dihydrocodeine                 69           EXPECTED   ng/mg creat   Norhydrocodone                 328          EXPECTED   ng/mg creat    Sources of hydrocodone include scheduled prescription    medications. Dihydrocodeine and norhydrocodone are expected    metabolites of hydrocodone. Dihydrocodeine is also available as a    scheduled prescription medication. ==================================================================== Test                       Result    Flag   Units      Ref Range   Creatinine              303              mg/dL      >=20 ==================================================================== Declared Medications:  The flagging and interpretation on this report are based on the  following declared medications.  Unexpected results may arise from  inaccuracies in the declared medications.  **Note: The testing scope of this panel includes these medications:  Hydrocodone (Norco)  **Note: The testing scope of this panel does not include following  reported medications:  Acetaminophen  Acetaminophen (Norco)  Atenolol  Atorvastatin  Baclofen  Benzonatate  Buspirone  Chondroitin  Cyanocobalamin  Desloratadine (Clarinex-D)  Duloxetine (Cymbalta)  Fenofibrate  Fluticasone (Flonase)  Ibuprofen  Magnesium Oxide  Melatonin  Multivitamin  Omeprazole (Prilosec)  Ondansetron (Zofran)  Potassium  Prednisone  Pseudoephedrine (Clarinex-D)  Vitamin D3 ==================================================================== For clinical consultation, please call ([phone] ====================================================================    UDS interpretation: Compliant          Medication Assessment Form: Reviewed. Patient indicates being compliant with therapy  Treatment compliance: Compliant Risk Assessment Profile: Aberrant behavior: See prior evaluations. None observed or detected today Comorbid factors increasing risk of overdose: See prior notes. No additional risks detected today Risk of substance use disorder (SUD): Low Opioid Risk Tool - 04/12/18 1151      Family History of Substance Abuse   Alcohol  Negative    Illegal Drugs  Negative    Rx Drugs  Negative      Personal History of Substance Abuse   Alcohol  Negative    Illegal Drugs  Negative    Rx Drugs  Negative      Age   Age between 53-45 years   No      History of Preadolescent Sexual Abuse   History of Preadolescent Sexual  Abuse  Negative or Male      Psychological Disease   Psychological Disease  Negative    Depression  Positive      Total Score   Opioid Risk Tool Scoring  1    Opioid Risk Interpretation  Low Risk      ORT Scoring interpretation table:  Score <3 = Low Risk for SUD  Score between 4-7 = Moderate Risk for SUD  Score >8 = High Risk for Opioid Abuse   Risk Mitigation Strategies:  Patient Counseling: Covered Patient-Prescriber Agreement (PPA): Present and active  Notification to other healthcare providers: Done  Pharmacologic Plan: No change in therapy, at this time.             Laboratory Chemistry  Inflammation Markers (CRP: Acute Phase) (ESR: Chronic Phase) Lab Results  Component Value Date   CRP 1.4 (H) 11/12/2016   ESRSEDRATE 3 11/12/2016                         Rheumatology Markers Lab Results  Component Value Date   LABURIC 5.6 07/30/2015                        Renal Function Markers Lab Results  Component Value Date   BUN 15 04/10/2018   CREATININE 1.13 04/10/2018   BCR 13 04/10/2018   GFRAA 87 04/10/2018   GFRNONAA 75 04/10/2018                             Hepatic Function Markers Lab Results  Component Value Date   AST 29 04/10/2018   ALT 36 04/10/2018   ALBUMIN 4.4 04/10/2018   ALKPHOS 48 04/10/2018                        Electrolytes Lab Results  Component Value Date   NA 141 04/10/2018   K 4.2 04/10/2018   CL 101 04/10/2018   CALCIUM 9.5 04/10/2018   MG 2.0 11/12/2016                        Neuropathy Markers Lab Results  Component Value Date   VITAMINB12 820 11/12/2016   HIV Non Reactive 04/10/2018                        Bone Pathology Markers Lab Results  Component Value Date   25OHVITD1 23 (L) 11/12/2016   25OHVITD2 <1.0 11/12/2016   25OHVITD3 23 11/12/2016  Coagulation Parameters Lab Results  Component Value Date   PLT 346 04/10/2018                        Cardiovascular Markers Lab Results   Component Value Date   HGB 15.0 04/10/2018   HCT 44.5 04/10/2018                         CA Markers No results found for: CEA, CA125, LABCA2                      Note: Lab results reviewed.  Recent Diagnostic Imaging Results  DG C-Arm 1-60 Min-No Report Fluoroscopy was utilized by the requesting physician.  No radiographic  interpretation.   Complexity Note: Imaging results reviewed. Results shared with Jose Washington, using Layman's terms.                         Meds   Current Outpatient Medications:  .  acetaminophen (TYLENOL) 500 MG tablet, Take 500 mg by mouth every 6 (six) hours as needed., Disp: , Rfl:  .  albuterol (PROVENTIL HFA;VENTOLIN HFA) 108 (90 Base) MCG/ACT inhaler, Inhale 2 puffs into the lungs every 6 (six) hours as needed for wheezing or shortness of breath., Disp: 1 Inhaler, Rfl: 0 .  atenolol (TENORMIN) 50 MG tablet, Take 1 tablet (50 mg total) by mouth daily., Disp: 90 tablet, Rfl: 1 .  atorvastatin (LIPITOR) 40 MG tablet, Take 1 tablet (40 mg total) by mouth daily., Disp: 90 tablet, Rfl: 1 .  [START ON 04/27/2018] baclofen (LIORESAL) 10 MG tablet, Take 1 tablet (10 mg total) by mouth at bedtime., Disp: 90 tablet, Rfl: 0 .  busPIRone (BUSPAR) 30 MG tablet, Take 1 tablet (30 mg total) by mouth 2 (two) times daily., Disp: 180 tablet, Rfl: 1 .  Calcium Carbonate (CALCIUM 600 PO), Take 600 mg by mouth daily., Disp: , Rfl:  .  Cholecalciferol (VITAMIN D3) 1000 units CAPS, Take 1 capsule by mouth daily., Disp: , Rfl:  .  Chondroitin Sulfate 400 MG CAPS, Take by mouth 2 (two) times daily., Disp: , Rfl:  .  desloratadine-pseudoephedrine (CLARINEX-D 12-HOUR) 2.5-120 MG 12 hr tablet, Take 1 tablet by mouth as needed., Disp: , Rfl:  .  DiphenhydrAMINE HCl (DIPHEDRYL ALLERGY PO), Take 25 mg by mouth daily., Disp: , Rfl:  .  DULoxetine (CYMBALTA) 30 MG capsule, Take 1 capsule (30 mg total) by mouth 2 (two) times daily., Disp: 180 capsule, Rfl: 1 .  fenofibrate 160 MG tablet,  Take 1 tablet (160 mg total) by mouth daily., Disp: 90 tablet, Rfl: 1 .  fluticasone (FLONASE) 50 MCG/ACT nasal spray, Place 2 sprays into both nostrils 2 (two) times daily., Disp: , Rfl:  .  Glucosamine-Chondroit-Vit C-Mn (GLUCOSAMINE 1500 COMPLEX PO), Take by mouth 2 (two) times daily., Disp: , Rfl:  .  [START ON 05/27/2018] HYDROcodone-acetaminophen (NORCO/VICODIN) 5-325 MG tablet, Take 1 tablet by mouth daily as needed for moderate pain., Disp: 15 tablet, Rfl: 0 .  ibuprofen (ADVIL,MOTRIN) 200 MG tablet, Take 200 mg by mouth 3 (three) times a week., Disp: , Rfl:  .  magnesium oxide (MAG-OX) 400 MG tablet, Take 400 mg by mouth daily., Disp: , Rfl:  .  Melatonin 5 MG TABS, Take by mouth at bedtime. , Disp: , Rfl:  .  Methylsulfonylmethane (MSM PO), Take by mouth 2 (two) times daily., Disp: ,  Rfl:  .  Multiple Vitamin (MULTIVITAMIN) tablet, Take 1 tablet by mouth daily., Disp: , Rfl:  .  omeprazole (PRILOSEC) 20 MG capsule, Take 1 capsule (20 mg total) by mouth daily., Disp: 90 capsule, Rfl: 1 .  ondansetron (ZOFRAN) 8 MG tablet, Take 1 tablet (8 mg total) by mouth 2 (two) times daily., Disp: 180 tablet, Rfl: 3 .  Potassium 99 MG TABS, Take 1 tablet by mouth., Disp: , Rfl:  .  vitamin B-12 (CYANOCOBALAMIN) 1000 MCG tablet, Take 1,000 mcg by mouth daily., Disp: , Rfl:  .  [START ON 06/26/2018] HYDROcodone-acetaminophen (NORCO/VICODIN) 5-325 MG tablet, Take 1 tablet by mouth daily as needed for moderate pain., Disp: 15 tablet, Rfl: 0 .  [START ON 04/27/2018] HYDROcodone-acetaminophen (NORCO/VICODIN) 5-325 MG tablet, Take 1 tablet by mouth daily as needed for moderate pain., Disp: 15 tablet, Rfl: 0  ROS  Constitutional: Denies any fever or chills Gastrointestinal: No reported hemesis, hematochezia, vomiting, or acute GI distress Musculoskeletal: Denies any acute onset joint swelling, redness, loss of ROM, or weakness Neurological: No reported episodes of acute onset apraxia, aphasia, dysarthria,  agnosia, amnesia, paralysis, loss of coordination, or loss of consciousness  Allergies  Jose Washington has No Known Allergies.  [place]  Drug: Jose Washington  reports that he does not use drugs. Alcohol:  reports that he does not drink alcohol. Tobacco:  reports that he has never smoked. He has never used smokeless tobacco. Medical:  has a past medical history of Allergy, Anxiety, Chronic duodenal ulcer with hemorrhage (2012), Chronic neck pain, Depression, Hyperlipidemia, Hypertension, and Microscopic hematuria. Surgical: Jose Washington  has a past surgical history that includes eye muscle repair [phone] and 1975) and bLEEDING ULCER (2012). Family: family history includes Allergies in his brother; Cancer in his maternal grandfather and mother; Dementia in his father and paternal grandfather; Depression in his father; Diabetes in his mother; Mental illness in his father; Stroke in his paternal grandfather.  Constitutional Exam  General appearance: Well nourished, well developed, and well hydrated. In no apparent acute distress Vitals:   04/12/18 1143  BP: 124/82  Pulse: 68  Resp: 16  Temp: 97.8 F (36.6 C)  TempSrc: Oral  SpO2: 100%  Weight: 182 lb (82.6 kg)  Height: '5\' 7"'  (1.702 m)  Psych/Mental status: Alert, oriented x 3 (person, place, & time)       Eyes: PERLA Respiratory: No evidence of acute respiratory distress  Cervical Spine Area Exam  Skin & Axial Inspection: No masses, redness, edema, swelling, or associated skin lesions Alignment: Symmetrical Functional ROM: Unrestricted ROM      Stability: No instability detected Muscle Tone/Strength: Functionally intact. No obvious neuro-muscular anomalies detected. Sensory (Neurological): Unimpaired Palpation: No palpable anomalies              Upper Extremity (UE) Exam    Side: Right upper extremity  Side: Left upper extremity  Skin & Extremity Inspection: Skin color, temperature, and hair growth are WNL. No peripheral edema or cyanosis.  No masses, redness, swelling, asymmetry, or associated skin lesions. No contractures.  Skin & Extremity Inspection: Skin color, temperature, and hair growth are WNL. No peripheral edema or cyanosis. No masses, redness, swelling, asymmetry, or associated skin lesions. No contractures.  Functional ROM: Unrestricted ROM          Functional ROM: Unrestricted ROM          Muscle Tone/Strength: Functionally intact. No obvious neuro-muscular anomalies detected.  Muscle Tone/Strength: Functionally intact. No obvious neuro-muscular anomalies detected.  Sensory (Neurological): Unimpaired          Sensory (Neurological): Unimpaired          Palpation: No palpable anomalies              Palpation: No palpable anomalies              Provocative Test(s):  Phalen's test: deferred Tinel's test: deferred Apley's scratch test (touch opposite shoulder):  Action 1 (Across chest): deferred Action 2 (Overhead): deferred Action 3 (LB reach): deferred   Provocative Test(s):  Phalen's test: deferred Tinel's test: deferred Apley's scratch test (touch opposite shoulder):  Action 1 (Across chest): deferred Action 2 (Overhead): deferred Action 3 (LB reach): deferred    Lumbar Spine Area Exam  Skin & Axial Inspection: No masses, redness, or swelling Alignment: Symmetrical Functional ROM: Unrestricted ROM       Stability: No instability detected Muscle Tone/Strength: Functionally intact. No obvious neuro-muscular anomalies detected. Sensory (Neurological): Unimpaired Palpation: No palpable anomalies       Provocative Tests: Lumbar Hyperextension/rotation test: deferred today       Lumbar quadrant test (Kemp's test): deferred today       Lumbar Lateral bending test: deferred today       Patrick's Maneuver: deferred today                   FABER test: deferred today       Thigh-thrust test: deferred today       S-I compression test: deferred today       S-I distraction test: deferred today        Gait &  Posture Assessment  Ambulation: Unassisted Gait: Relatively normal for age and body habitus Posture: WNL   Lower Extremity Exam    Side: Right lower extremity  Side: Left lower extremity  Stability: No instability observed          Stability: No instability observed          Skin & Extremity Inspection: Skin color, temperature, and hair growth are WNL. No peripheral edema or cyanosis. No masses, redness, swelling, asymmetry, or associated skin lesions. No contractures.  Skin & Extremity Inspection: Skin color, temperature, and hair growth are WNL. No peripheral edema or cyanosis. No masses, redness, swelling, asymmetry, or associated skin lesions. No contractures.  Functional ROM: Unrestricted ROM                  Functional ROM: Unrestricted ROM                  Muscle Tone/Strength: Functionally intact. No obvious neuro-muscular anomalies detected.  Muscle Tone/Strength: Functionally intact. No obvious neuro-muscular anomalies detected.  Sensory (Neurological): Unimpaired  Sensory (Neurological): Unimpaired  Palpation: No palpable anomalies  Palpation: No palpable anomalies   Assessment  Primary Diagnosis & Pertinent Problem List: The primary encounter diagnosis was Cervical spondylosis. Diagnoses of Lumbar spondylosis, Chronic sacroiliac joint pain (Bilateral) (L>R), Chronic low back pain (Location of Secondary source of pain) (Bilateral) (L>R), Muscle spasticity, and Chronic pain syndrome were also pertinent to this visit.  Status Diagnosis  Controlled Controlled Controlled 1. Cervical spondylosis   2. Lumbar spondylosis   3. Chronic sacroiliac joint pain (Bilateral) (L>R)   4. Chronic low back pain (Location of Secondary source of pain) (Bilateral) (L>R)   5. Muscle spasticity   6. Chronic pain syndrome     Problems updated and reviewed during this visit: Problem  Chronic Right Shoulder Pain  Suprascapular Neuropathy, Right  Chronic knee pain (Left)  Chronic lower extremity  pain (Left)  Grade 1 Retrolisthesis of L3 over L4  Lumbar facet arthropathy (Bilateral)  Lumbar facet osteoarthritis  Lumbar facet syndrome (Bilateral) (L>R)  Lumbar spondylosis   Plan of Care  Pharmacotherapy (Medications Ordered): Meds ordered this encounter  Medications  . baclofen (LIORESAL) 10 MG tablet    Sig: Take 1 tablet (10 mg total) by mouth at bedtime.    Dispense:  90 tablet    Refill:  0    Do not place medication on "Automatic Refill". Fill one day early if pharmacy is closed on scheduled refill date.    Order Specific Question:   Supervising Provider    Answer:   Milinda Pointer [phone]  . HYDROcodone-acetaminophen (NORCO/VICODIN) 5-325 MG tablet    Sig: Take 1 tablet by mouth daily as needed for moderate pain.    Dispense:  15 tablet    Refill:  0    Fill one day early if pharmacy is closed on scheduled refill date. Do not fill until: 06/26/2018 To last until:07/26/2018    Order Specific Question:   Supervising Provider    Answer:   Milinda Pointer [phone]  . HYDROcodone-acetaminophen (NORCO/VICODIN) 5-325 MG tablet    Sig: Take 1 tablet by mouth daily as needed for moderate pain.    Dispense:  15 tablet    Refill:  0    Fill one day early if pharmacy is closed on scheduled refill date. Do not fill until:05/27/2018 To last until:06/26/2018    Order Specific Question:   Supervising Provider    Answer:   Milinda Pointer [phone]  . HYDROcodone-acetaminophen (NORCO/VICODIN) 5-325 MG tablet    Sig: Take 1 tablet by mouth daily as needed for moderate pain.    Dispense:  15 tablet    Refill:  0    Fill one day early if pharmacy is closed on scheduled refill date. Do not fill until:04/27/2018 To last until: 05/27/2018    Order Specific Question:   Supervising Provider    Answer:   Milinda Pointer [phone]   New Prescriptions   No medications on file   Medications administered today: Sallee Lange. Ritzel had no medications administered during this  visit. Lab-work, procedure(s), and/or referral(s): No orders of the defined types were placed in this encounter.  Imaging and/or referral(s): None  Interventional management options: Planned, scheduled, and/or pending: Not at this time    Considering: Diagnostic bilateral lumbar facetblock  Possible bilateral lumbar facet RFA Palliative left-sided cervical epidural steroid injection#2 Diagnostic bilateral cervical facetblock  Possible bilateral cervical facet RFA Diagnostic bilateral sacroiliac joint block Possible bilateral sacroiliac joint RFA Diagnostic trigger point injections   Palliative PRN treatment(s): Diagnostic left L4-5 interlaminar lumbar epidural steroid injection #2 Palliative left-sided cervical epidural steroid injection#2      Provider-requested follow-up: Return in about 3 months (around 07/13/2018) for MedMgmt with Me Dionisio David).  Future Appointments  Date Time Provider [place]  07/13/2018 10:45 AM Vevelyn Francois, NP [facility] None  10/18/2018 10:30 AM Volney American, PA-C [facility]   Primary Care Physician: Kathrine Haddock, NP Location: [facility] Note by: Vevelyn Francois NP Date: 04/12/2018; Time: 4:05 PM  Pain Score Disclaimer: We use the NRS-11 scale. This is a self-reported, subjective measurement of pain severity with only modest accuracy. It is used primarily to identify changes within a particular patient. It must be understood that outpatient pain scales are significantly less accurate that those  used for research, where they can be applied under ideal controlled circumstances with minimal exposure to variables. In reality, the score is likely to be a combination of pain intensity and pain affect, where pain affect describes the degree of emotional arousal or changes in action readiness caused by the sensory experience of pain. Factors such as social and work situation, setting,  emotional state, anxiety levels, expectation, and prior pain experience may influence pain perception and show large inter-individual differences that may also be affected by time variables.  Patient instructions provided during this appointment: Patient Instructions  ____________________________________________________________________________________________  Medication Rules  Applies to: All patients receiving prescriptions (written or electronic).  Pharmacy of record: Pharmacy where electronic prescriptions will be sent. If written prescriptions are taken to a different pharmacy, please inform the nursing staff. The pharmacy listed in the electronic medical record should be the one where you would like electronic prescriptions to be sent.  Prescription refills: Only during scheduled appointments. Applies to both, written and electronic prescriptions.  NOTE: The following applies primarily to controlled substances (Opioid* Pain Medications).   Patient's responsibilities: 1. Pain Pills: Bring all pain pills to every appointment (except for procedure appointments). 2. Pill Bottles: Bring pills in original pharmacy bottle. Always bring newest bottle. Bring bottle, even if empty. 3. Medication refills: You are responsible for knowing and keeping track of what medications you need refilled. The day before your appointment, write a list of all prescriptions that need to be refilled. Bring that list to your appointment and give it to the admitting nurse. Prescriptions will be written only during appointments. If you forget a medication, it will not be "Called in", "Faxed", or "electronically sent". You will need to get another appointment to get these prescribed. 4. Prescription Accuracy: You are responsible for carefully inspecting your prescriptions before leaving our office. Have the discharge nurse carefully go over each prescription with you, before taking them home. Make sure that your name is  accurately spelled, that your address is correct. Check the name and dose of your medication to make sure it is accurate. Check the number of pills, and the written instructions to make sure they are clear and accurate. Make sure that you are given enough medication to last until your next medication refill appointment. 5. Taking Medication: Take medication as prescribed. Never take more pills than instructed. Never take medication more frequently than prescribed. Taking less pills or less frequently is permitted and encouraged, when it comes to controlled substances (written prescriptions).  6. Inform other Doctors: Always inform, all of your healthcare providers, of all the medications you take. 7. Pain Medication from other Providers: You are not allowed to accept any additional pain medication from any other Doctor or Healthcare provider. There are two exceptions to this rule. (see below) In the event that you require additional pain medication, you are responsible for notifying us, as stated below. 8. Medication Agreement: You are responsible for carefully reading and following our Medication Agreement. This must be signed before receiving any prescriptions from our practice. Safely store a copy of your signed Agreement. Violations to the Agreement will result in no further prescriptions. (Additional copies of our Medication Agreement are available upon request.) 9. Laws, Rules, & Regulations: All patients are expected to follow all Federal and [employer], [employer], Rules, [employer]. Ignorance of the Laws does not constitute a valid excuse. The use of any illegal substances is prohibited. 10. Adopted CDC guidelines & recommendations: Target dosing levels will be at  or below 60 MME/day. Use of benzodiazepines** is not recommended.  Exceptions: There are only two exceptions to the rule of not receiving pain medications from other Healthcare Providers. 1. Exception #1 (Emergencies): In the event of an  emergency (i.e.: accident requiring emergency care), you are allowed to receive additional pain medication. However, you are responsible for: As soon as you are able, call our office [phone], at any time of the day or night, and leave a message stating your name, the date and nature of the emergency, and the name and dose of the medication prescribed. In the event that your call is answered by a member of our staff, make sure to document and save the date, time, and the name of the person that took your information.  2. Exception #2 (Planned Surgery): In the event that you are scheduled by another doctor or dentist to have any type of surgery or procedure, you are allowed (for a period no longer than 30 days), to receive additional pain medication, for the acute post-op pain. However, in this case, you are responsible for picking up a copy of our "Post-op Pain Management for Surgeons" handout, and giving it to your surgeon or dentist. This document is available at our office, and does not require an appointment to obtain it. Simply go to our office during business hours (Monday-Thursday from 8:00 AM to 4:00 PM) (Friday 8:00 AM to 12:00 Noon) or if you have a scheduled appointment with [place], prior to your surgery, and ask for it by name. In addition, you will need to provide [place] with your name, name of your surgeon, type of surgery, and date of procedure or surgery.  *Opioid medications include: morphine, codeine, oxycodone, oxymorphone, hydrocodone, hydromorphone, meperidine, tramadol, tapentadol, buprenorphine, fentanyl, methadone. **Benzodiazepine medications include: diazepam (Valium), alprazolam (Xanax), clonazepam (Klonopine), lorazepam (Ativan), clorazepate (Tranxene), chlordiazepoxide (Librium), estazolam (Prosom), oxazepam (Serax), temazepam (Restoril), triazolam (Halcion) (Last updated:  12/29/2017) ____________________________________________________________________________________________

## 2018-04-12 NOTE — Progress Notes (Signed)
Nursing Pain Medication Assessment:  Safety precautions to be maintained throughout the outpatient stay will include: orient to surroundings, keep bed in low position, maintain call bell within reach at all times, provide assistance with transfer out of bed and ambulation.  Medication Inspection Compliance: Pill count conducted under aseptic conditions, in front of the patient. Neither the pills nor the bottle was removed from the patient's sight at any time. Once count was completed pills were immediately returned to the patient in their original bottle.  Medication: Hydrocodone/APAP Pill/Patch Count: 4 of 15 pills remain Pill/Patch Appearance: Markings consistent with prescribed medication Bottle Appearance: Standard pharmacy container. Clearly labeled. Filled Date: 5 / 28 / 2019 Last Medication intake:  Yesterday

## 2018-04-12 NOTE — Patient Instructions (Signed)
____________________________________________________________________________________________  Medication Rules  Applies to: All patients receiving prescriptions (written or electronic).  Pharmacy of record: Pharmacy where electronic prescriptions will be sent. If written prescriptions are taken to a different pharmacy, please inform the nursing staff. The pharmacy listed in the electronic medical record should be the one where you would like electronic prescriptions to be sent.  Prescription refills: Only during scheduled appointments. Applies to both, written and electronic prescriptions.  NOTE: The following applies primarily to controlled substances (Opioid* Pain Medications).   Patient's responsibilities: 1. Pain Pills: Bring all pain pills to every appointment (except for procedure appointments). 2. Pill Bottles: Bring pills in original pharmacy bottle. Always bring newest bottle. Bring bottle, even if empty. 3. Medication refills: You are responsible for knowing and keeping track of what medications you need refilled. The day before your appointment, write a list of all prescriptions that need to be refilled. Bring that list to your appointment and give it to the admitting nurse. Prescriptions will be written only during appointments. If you forget a medication, it will not be "Called in", "Faxed", or "electronically sent". You will need to get another appointment to get these prescribed. 4. Prescription Accuracy: You are responsible for carefully inspecting your prescriptions before leaving our office. Have the discharge nurse carefully go over each prescription with you, before taking them home. Make sure that your name is accurately spelled, that your address is correct. Check the name and dose of your medication to make sure it is accurate. Check the number of pills, and the written instructions to make sure they are clear and accurate. Make sure that you are given enough medication to last  until your next medication refill appointment. 5. Taking Medication: Take medication as prescribed. Never take more pills than instructed. Never take medication more frequently than prescribed. Taking less pills or less frequently is permitted and encouraged, when it comes to controlled substances (written prescriptions).  6. Inform other Doctors: Always inform, all of your healthcare providers, of all the medications you take. 7. Pain Medication from other Providers: You are not allowed to accept any additional pain medication from any other Doctor or Healthcare provider. There are two exceptions to this rule. (see below) In the event that you require additional pain medication, you are responsible for notifying us, as stated below. 8. Medication Agreement: You are responsible for carefully reading and following our Medication Agreement. This must be signed before receiving any prescriptions from our practice. Safely store a copy of your signed Agreement. Violations to the Agreement will result in no further prescriptions. (Additional copies of our Medication Agreement are available upon request.) 9. Laws, Rules, & Regulations: All patients are expected to follow all Federal and State Laws, Statutes, Rules, & Regulations. Ignorance of the Laws does not constitute a valid excuse. The use of any illegal substances is prohibited. 10. Adopted CDC guidelines & recommendations: Target dosing levels will be at or below 60 MME/day. Use of benzodiazepines** is not recommended.  Exceptions: There are only two exceptions to the rule of not receiving pain medications from other Healthcare Providers. 1. Exception #1 (Emergencies): In the event of an emergency (i.e.: accident requiring emergency care), you are allowed to receive additional pain medication. However, you are responsible for: As soon as you are able, call our office [phone], at any time of the day or night, and leave a message stating your name, the  date and nature of the emergency, and the name and dose of the medication   prescribed. In the event that your call is answered by a member of our staff, make sure to document and save the date, time, and the name of the person that took your information.  2. Exception #2 (Planned Surgery): In the event that you are scheduled by another doctor or dentist to have any type of surgery or procedure, you are allowed (for a period no longer than 30 days), to receive additional pain medication, for the acute post-op pain. However, in this case, you are responsible for picking up a copy of our "Post-op Pain Management for Surgeons" handout, and giving it to your surgeon or dentist. This document is available at our office, and does not require an appointment to obtain it. Simply go to our office during business hours (Monday-Thursday from 8:00 AM to 4:00 PM) (Friday 8:00 AM to 12:00 Noon) or if you have a scheduled appointment with us, prior to your surgery, and ask for it by name. In addition, you will need to provide us with your name, name of your surgeon, type of surgery, and date of procedure or surgery.  *Opioid medications include: morphine, codeine, oxycodone, oxymorphone, hydrocodone, hydromorphone, meperidine, tramadol, tapentadol, buprenorphine, fentanyl, methadone. **Benzodiazepine medications include: diazepam (Valium), alprazolam (Xanax), clonazepam (Klonopine), lorazepam (Ativan), clorazepate (Tranxene), chlordiazepoxide (Librium), estazolam (Prosom), oxazepam (Serax), temazepam (Restoril), triazolam (Halcion) (Last updated: 12/29/2017) ____________________________________________________________________________________________    

## 2018-05-21 ENCOUNTER — Other Ambulatory Visit: Payer: Self-pay | Admitting: Family Medicine

## 2018-07-13 ENCOUNTER — Other Ambulatory Visit: Payer: Self-pay

## 2018-07-13 ENCOUNTER — Encounter: Payer: Self-pay | Admitting: Nurse Practitioner

## 2018-07-13 ENCOUNTER — Ambulatory Visit: Payer: 59 | Attending: Nurse Practitioner | Admitting: Nurse Practitioner

## 2018-07-13 VITALS — BP 122/81 | HR 89 | Temp 98.1°F | Resp 16 | Ht 67.0 in | Wt 184.0 lb

## 2018-07-13 DIAGNOSIS — M50321 Other cervical disc degeneration at C4-C5 level: Secondary | ICD-10-CM | POA: Diagnosis not present

## 2018-07-13 DIAGNOSIS — Z79891 Long term (current) use of opiate analgesic: Secondary | ICD-10-CM | POA: Diagnosis not present

## 2018-07-13 DIAGNOSIS — Z791 Long term (current) use of non-steroidal anti-inflammatories (NSAID): Secondary | ICD-10-CM | POA: Insufficient documentation

## 2018-07-13 DIAGNOSIS — Z818 Family history of other mental and behavioral disorders: Secondary | ICD-10-CM | POA: Diagnosis not present

## 2018-07-13 DIAGNOSIS — I10 Essential (primary) hypertension: Secondary | ICD-10-CM | POA: Insufficient documentation

## 2018-07-13 DIAGNOSIS — Z79899 Other long term (current) drug therapy: Secondary | ICD-10-CM | POA: Diagnosis not present

## 2018-07-13 DIAGNOSIS — M4316 Spondylolisthesis, lumbar region: Secondary | ICD-10-CM | POA: Insufficient documentation

## 2018-07-13 DIAGNOSIS — M545 Low back pain: Secondary | ICD-10-CM | POA: Diagnosis not present

## 2018-07-13 DIAGNOSIS — M47892 Other spondylosis, cervical region: Secondary | ICD-10-CM | POA: Diagnosis not present

## 2018-07-13 DIAGNOSIS — G894 Chronic pain syndrome: Secondary | ICD-10-CM | POA: Insufficient documentation

## 2018-07-13 DIAGNOSIS — G8929 Other chronic pain: Secondary | ICD-10-CM

## 2018-07-13 DIAGNOSIS — M19011 Primary osteoarthritis, right shoulder: Secondary | ICD-10-CM | POA: Diagnosis not present

## 2018-07-13 DIAGNOSIS — M79605 Pain in left leg: Secondary | ICD-10-CM | POA: Insufficient documentation

## 2018-07-13 DIAGNOSIS — Z5181 Encounter for therapeutic drug level monitoring: Secondary | ICD-10-CM | POA: Diagnosis not present

## 2018-07-13 DIAGNOSIS — M4802 Spinal stenosis, cervical region: Secondary | ICD-10-CM | POA: Diagnosis not present

## 2018-07-13 DIAGNOSIS — M25562 Pain in left knee: Secondary | ICD-10-CM | POA: Diagnosis not present

## 2018-07-13 DIAGNOSIS — F419 Anxiety disorder, unspecified: Secondary | ICD-10-CM | POA: Diagnosis not present

## 2018-07-13 DIAGNOSIS — M62838 Other muscle spasm: Secondary | ICD-10-CM

## 2018-07-13 DIAGNOSIS — F329 Major depressive disorder, single episode, unspecified: Secondary | ICD-10-CM | POA: Insufficient documentation

## 2018-07-13 DIAGNOSIS — M4310 Spondylolisthesis, site unspecified: Secondary | ICD-10-CM

## 2018-07-13 DIAGNOSIS — M25512 Pain in left shoulder: Secondary | ICD-10-CM | POA: Diagnosis not present

## 2018-07-13 DIAGNOSIS — E785 Hyperlipidemia, unspecified: Secondary | ICD-10-CM | POA: Insufficient documentation

## 2018-07-13 DIAGNOSIS — M5412 Radiculopathy, cervical region: Secondary | ICD-10-CM | POA: Diagnosis not present

## 2018-07-13 DIAGNOSIS — M533 Sacrococcygeal disorders, not elsewhere classified: Secondary | ICD-10-CM | POA: Insufficient documentation

## 2018-07-13 MED ORDER — HYDROCODONE-ACETAMINOPHEN 5-325 MG PO TABS: 1.0000 | ORAL_TABLET | Freq: Every day | ORAL | 0 refills | Status: DC | PRN

## 2018-07-13 MED ORDER — BACLOFEN 10 MG PO TABS: 10.0000 mg | ORAL_TABLET | Freq: Every day | ORAL | 0 refills | Status: DC

## 2018-07-13 NOTE — Progress Notes (Signed)
Nursing Pain Medication Assessment:  Safety precautions to be maintained throughout the outpatient stay will include: orient to surroundings, keep bed in low position, maintain call bell within reach at all times, provide assistance with transfer out of bed and ambulation.  Medication Inspection Compliance: Pill count conducted under aseptic conditions, in front of the patient. Neither the pills nor the bottle was removed from the patient's sight at any time. Once count was completed pills were immediately returned to the patient in their original bottle.  Medication: Hydrocodone/APAP Pill/Patch Count: 7 of 15 pills remain Pill/Patch Appearance: Markings consistent with prescribed medication Bottle Appearance: Standard pharmacy container. Clearly labeled. Filled Date: 08 / 31 / 2019 Last Medication intake:  Day before yesterday

## 2018-07-13 NOTE — Patient Instructions (Addendum)
____________________________________________________________________________________________  Medication Rules  Applies to: All patients receiving prescriptions (written or electronic).  Pharmacy of record: Pharmacy where electronic prescriptions will be sent. If written prescriptions are taken to a different pharmacy, please inform the nursing staff. The pharmacy listed in the electronic medical record should be the one where you would like electronic prescriptions to be sent.  Prescription refills: Only during scheduled appointments. Applies to both, written and electronic prescriptions.  NOTE: The following applies primarily to controlled substances (Opioid* Pain Medications).   Patient's responsibilities: 1. Pain Pills: Bring all pain pills to every appointment (except for procedure appointments). 2. Pill Bottles: Bring pills in original pharmacy bottle. Always bring newest bottle. Bring bottle, even if empty. 3. Medication refills: You are responsible for knowing and keeping track of what medications you need refilled. The day before your appointment, write a list of all prescriptions that need to be refilled. Bring that list to your appointment and give it to the admitting nurse. Prescriptions will be written only during appointments. If you forget a medication, it will not be "Called in", "Faxed", or "electronically sent". You will need to get another appointment to get these prescribed. 4. Prescription Accuracy: You are responsible for carefully inspecting your prescriptions before leaving our office. Have the discharge nurse carefully go over each prescription with you, before taking them home. Make sure that your name is accurately spelled, that your address is correct. Check the name and dose of your medication to make sure it is accurate. Check the number of pills, and the written instructions to make sure they are clear and accurate. Make sure that you are given enough medication to last  until your next medication refill appointment. 5. Taking Medication: Take medication as prescribed. Never take more pills than instructed. Never take medication more frequently than prescribed. Taking less pills or less frequently is permitted and encouraged, when it comes to controlled substances (written prescriptions).  6. Inform other Doctors: Always inform, all of your healthcare providers, of all the medications you take. 7. Pain Medication from other Providers: You are not allowed to accept any additional pain medication from any other Doctor or Healthcare provider. There are two exceptions to this rule. (see below) In the event that you require additional pain medication, you are responsible for notifying us, as stated below. 8. Medication Agreement: You are responsible for carefully reading and following our Medication Agreement. This must be signed before receiving any prescriptions from our practice. Safely store a copy of your signed Agreement. Violations to the Agreement will result in no further prescriptions. (Additional copies of our Medication Agreement are available upon request.) 9. Laws, Rules, & Regulations: All patients are expected to follow all Federal and State Laws, Statutes, Rules, & Regulations. Ignorance of the Laws does not constitute a valid excuse. The use of any illegal substances is prohibited. 10. Adopted CDC guidelines & recommendations: Target dosing levels will be at or below 60 MME/day. Use of benzodiazepines** is not recommended.  Exceptions: There are only two exceptions to the rule of not receiving pain medications from other Healthcare Providers. 1. Exception #1 (Emergencies): In the event of an emergency (i.e.: accident requiring emergency care), you are allowed to receive additional pain medication. However, you are responsible for: As soon as you are able, call our office [phone], at any time of the day or night, and leave a message stating your name, the  date and nature of the emergency, and the name and dose of the medication   prescribed. In the event that your call is answered by a member of our staff, make sure to document and save the date, time, and the name of the person that took your information.  2. Exception #2 (Planned Surgery): In the event that you are scheduled by another doctor or dentist to have any type of surgery or procedure, you are allowed (for a period no longer than 30 days), to receive additional pain medication, for the acute post-op pain. However, in this case, you are responsible for picking up a copy of our "Post-op Pain Management for Surgeons" handout, and giving it to your surgeon or dentist. This document is available at our office, and does not require an appointment to obtain it. Simply go to our office during business hours (Monday-Thursday from 8:00 AM to 4:00 PM) (Friday 8:00 AM to 12:00 Noon) or if you have a scheduled appointment with [place], prior to your surgery, and ask for it by name. In addition, you will need to provide [place] with your name, name of your surgeon, type of surgery, and date of procedure or surgery.  *Opioid medications include: morphine, codeine, oxycodone, oxymorphone, hydrocodone, hydromorphone, meperidine, tramadol, tapentadol, buprenorphine, fentanyl, methadone. **Benzodiazepine medications include: diazepam (Valium), alprazolam (Xanax), clonazepam (Klonopine), lorazepam (Ativan), clorazepate (Tranxene), chlordiazepoxide (Librium), estazolam (Prosom), oxazepam (Serax), temazepam (Restoril), triazolam (Halcion) (Last updated: 12/29/2017) ____________________________________________________________________________________________   ____________________________________________________________________________________________  General Risks and Possible Complications  Patient Responsibilities: It is important that you read this as it is part of your informed consent. It is our duty to inform you of the  risks and possible complications associated with treatments offered to you. It is your responsibility as a patient to read this and to ask questions about anything that is not clear or that you believe was not covered in this document.  Patient's Rights: You have the right to refuse treatment. You also have the right to change your mind, even after initially having agreed to have the treatment done. However, under this last option, if you wait until the last second to change your mind, you may be charged for the materials used up to that point.  Introduction: Medicine is not an Chief Strategy Officer. Everything in Medicine, including the lack of treatment(s), carries the potential for danger, harm, or loss (which is by definition: Risk). In Medicine, a complication is a secondary problem, condition, or disease that can aggravate an already existing one. All treatments carry the risk of possible complications. The fact that a side effects or complications occurs, does not imply that the treatment was conducted incorrectly. It must be clearly understood that these can happen even when everything is done following the highest safety standards.  No treatment: You can choose not to proceed with the proposed treatment alternative. The "PRO(s)" would include: avoiding the risk of complications associated with the therapy. The "CON(s)" would include: not getting any of the treatment benefits. These benefits fall under one of three categories: diagnostic; therapeutic; and/or palliative. Diagnostic benefits include: getting information which can ultimately lead to improvement of the disease or symptom(s). Therapeutic benefits are those associated with the successful treatment of the disease. Finally, palliative benefits are those related to the decrease of the primary symptoms, without necessarily curing the condition (example: decreasing the pain from a flare-up of a chronic condition, such as incurable terminal  cancer).  General Risks and Complications: These are associated to most interventional treatments. They can occur alone, or in combination. They fall under one of the following six (6) categories:  no benefit or worsening of symptoms; bleeding; infection; nerve damage; allergic reactions; and/or death. 1. No benefits or worsening of symptoms: In Medicine there are no guarantees, only probabilities. No healthcare provider can ever guarantee that a medical treatment will work, they can only state the probability that it may. Furthermore, there is always the possibility that the condition may worsen, either directly, or indirectly, as a consequence of the treatment. 2. Bleeding: This is more common if the patient is taking a blood thinner, either prescription or over the counter (example: Goody Powders, Fish oil, Aspirin, Garlic, etc.), or if suffering a condition associated with impaired coagulation (example: Hemophilia, cirrhosis of the liver, low platelet counts, etc.). However, even if you do not have one on these, it can still happen. If you have any of these conditions, or take one of these drugs, make sure to notify your treating physician. 3. Infection: This is more common in patients with a compromised immune system, either due to disease (example: diabetes, cancer, human immunodeficiency virus [HIV], etc.), or due to medications or treatments (example: therapies used to treat cancer and rheumatological diseases). However, even if you do not have one on these, it can still happen. If you have any of these conditions, or take one of these drugs, make sure to notify your treating physician. 4. Nerve Damage: This is more common when the treatment is an invasive one, but it can also happen with the use of medications, such as those used in the treatment of cancer. The damage can occur to small secondary nerves, or to large primary ones, such as those in the spinal cord and brain. This damage may be temporary  or permanent and it may lead to impairments that can range from temporary numbness to permanent paralysis and/or brain death. 5. Allergic Reactions: Any time a substance or material comes in contact with our body, there is the possibility of an allergic reaction. These can range from a mild skin rash (contact dermatitis) to a severe systemic reaction (anaphylactic reaction), which can result in death. 6. Death: In general, any medical intervention can result in death, most of the time due to an unforeseen complication. ____________________________________________________________________________________________  Epidural Steroid Injection Patient Information  Description: The epidural space surrounds the nerves as they exit the spinal cord.  In some patients, the nerves can be compressed and inflamed by a bulging disc or a tight spinal canal (spinal stenosis).  By injecting steroids into the epidural space, we can bring irritated nerves into direct contact with a potentially helpful medication.  These steroids act directly on the irritated nerves and can reduce swelling and inflammation which often leads to decreased pain.  Epidural steroids may be injected anywhere along the spine and from the neck to the low back depending upon the location of your pain.   After numbing the skin with local anesthetic (like Novocaine), a small needle is passed into the epidural space slowly.  You may experience a sensation of pressure while this is being done.  The entire block usually last less than 10 minutes.  Conditions which may be treated by epidural steroids:   Low back and leg pain  Neck and arm pain  Spinal stenosis  Post-laminectomy syndrome  Herpes zoster (shingles) pain  Pain from compression fractures  Preparation for the injection:  3. Do not eat any solid food or dairy products within 8 hours of your appointment.  4. You may drink clear liquids up to 3 hours before appointment.  Clear  liquids include water, black coffee, juice or soda.  No milk or cream please. 5. You may take your regular medication, including pain medications, with a sip of water before your appointment  Diabetics should hold regular insulin (if taken separately) and take 1/2 normal NPH dos the morning of the procedure.  Carry some sugar containing items with you to your appointment. 6. A driver must accompany you and be prepared to drive you home after your procedure.  7. Bring all your current medications with your. 8. An IV may be inserted and sedation may be given at the discretion of the physician.   9. A blood pressure cuff, EKG and other monitors will often be applied during the procedure.  Some patients may need to have extra oxygen administered for a short period. 10. You will be asked to provide medical information, including your allergies, prior to the procedure.  We must know immediately if you are taking blood thinners (like Coumadin/Warfarin)  Or if you are allergic to IV iodine contrast (dye). We must know if you could possible be pregnant.  Possible side-effects:  Bleeding from needle site  Infection (rare, may require surgery)  Nerve injury (rare)  Numbness & tingling (temporary)  Difficulty urinating (rare, temporary)  Spinal headache ( a headache worse with upright posture)  Light -headedness (temporary)  Pain at injection site (several days)  Decreased blood pressure (temporary)  Weakness in arm/leg (temporary)  Pressure sensation in back/neck (temporary)  Call if you experience:  Fever/chills associated with headache or increased back/neck pain.  Headache worsened by an upright position.  New onset weakness or numbness of an extremity below the injection site  Hives or difficulty breathing (go to the emergency room)  Inflammation or drainage at the infection site  Severe back/neck pain  Any new symptoms which are concerning to you  Please note:  Although  the local anesthetic injected can often make your back or neck feel good for several hours after the injection, the pain will likely return.  It takes 3-7 days for steroids to work in the epidural space.  You may not notice any pain relief for at least that one week.  If effective, we will often do a series of three injections spaced 3-6 weeks apart to maximally decrease your pain.  After the initial series, we generally will wait several months before considering a repeat injection of the same type.  If you have any questions, please call [phone] [facility]

## 2018-07-13 NOTE — Progress Notes (Signed)
Patient's Name: Jose Washington  MRN: [id number]  Referring Provider: Kathrine Haddock, NP  DOB: 11/10/1965  PCP: Kathrine Haddock, NP  DOS: 07/13/2018  Note by: Vevelyn Francois NP  Service setting: Ambulatory outpatient  Specialty: Interventional Pain Management  Location: [facility] (AMB) Pain Management Facility    Patient type: Established    Primary Reason(s) for Visit: Encounter for prescription drug management. (Level of risk: moderate)  CC: Neck Pain (left) and Back Pain (left and low, right and low)  HPI  Mr. Burnside is a 52 y.o. year old, male patient, who comes today for a medication management evaluation. He has Chronic neck pain (Primary Source of Pain) (Bilateral) (L>R); Hyperlipidemia; Essential hypertension, benign; Gastritis; Chronic pain syndrome; Chronic low back pain (Secondary source of pain) (Bilateral) (L>R); Chronic upper back pain [facility] source of pain) (Bilateral) (L>R); Chronic shoulder pain (Bilateral) (L>R); Osteoarthritis of AC (acromioclavicular) joint (Right); Chronic sacroiliac joint pain (Bilateral) (L>R); Long term current use of opiate analgesic; Long term prescription opiate use; Opiate use (5 MME/Day); Muscle spasticity; Cervical DDD (C4-5, C5-6, C6-7 and C7-T1); Cervical foraminal stenosis (Bilateral: C5-6 & C6-7, Left: C4-5 & C7-T1); Cervical radiculitis (Bilateral) (L>R); Vitamin D insufficiency; Cervical facet syndrome (HCC) (L); Cervical spondylosis; Musculoskeletal neck pain (trapezius) (Left); Major depression in remission ([place]); Chronic knee pain (Left); Chronic lower extremity pain (Left); Grade 1 Retrolisthesis of L3 over L4; Lumbar facet arthropathy (Bilateral); Lumbar facet osteoarthritis; Lumbar facet syndrome (Bilateral) (L>R); Lumbar spondylosis; Chronic right shoulder pain; Suprascapular neuropathy, right; and Depression, major, single episode, complete remission ([place]) on their problem list. His primarily concern today is the Neck Pain (left) and Back Pain  (left and low, right and low)  Pain Assessment: Location: Left Neck Radiating: low back Onset: More than a month ago Duration: Chronic pain Quality: Aching, Constant Severity: 1 /10 (subjective, self-reported pain score)  Note: Reported level is compatible with observation.                          Effect on ADL:   Timing: Constant Modifying factors: medications, rest, stretching, TENS,  BP: 122/81  HR: 89  Mr. Bearce was last scheduled for an appointment on 04/12/2018 for medication management. During today's appointment we reviewed Mr. Pavao chronic pain status, as well as his outpatient medication regimen. He states that the TENS unit is more effective than the pain medication. He admits that he does well with ESI. He admits that he is ready to try to another injections.   The patient  reports that he does not use drugs. His body mass index is 28.82 kg/m.  Further details on both, my assessment(s), as well as the proposed treatment plan, please see below.  Controlled Substance Pharmacotherapy Assessment REMS (Risk Evaluation and Mitigation Strategy)  Analgesic:Hydrocodone/APAP 5/325 one daily ME/day:7.13m/day  SHart Rochester RN  07/13/2018 11:08 AM  Sign at close encounter Nursing Pain Medication Assessment:  Safety precautions to be maintained throughout the outpatient stay will include: orient to surroundings, keep bed in low position, maintain call bell within reach at all times, provide assistance with transfer out of bed and ambulation.  Medication Inspection Compliance: Pill count conducted under aseptic conditions, in front of the patient. Neither the pills nor the bottle was removed from the patient's sight at any time. Once count was completed pills were immediately returned to the patient in their original bottle.  Medication: Hydrocodone/APAP Pill/Patch Count: 7 of 15 pills remain Pill/Patch Appearance: Markings consistent  with prescribed  medication Bottle Appearance: Standard pharmacy container. Clearly labeled. Filled Date: 08 / 31 / 2019 Last Medication intake:  Day before yesterday   Pharmacokinetics: Liberation and absorption (onset of action): WNL Distribution (time to peak effect): WNL Metabolism and excretion (duration of action): WNL         Pharmacodynamics: Desired effects: Analgesia: Mr. Reali reports >50% benefit. Functional ability: Patient reports that medication allows him to accomplish basic ADLs Clinically meaningful improvement in function (CMIF): Sustained CMIF goals met Perceived effectiveness: Described as relatively effective, allowing for increase in activities of daily living (ADL) Undesirable effects: Side-effects or Adverse reactions: None reported Monitoring: [place] PMP: Online review of the past 60-monthperiod conducted. Compliant with practice rules and regulations Last UDS on record: Summary  Date Value Ref Range Status  01/18/2018 FINAL  Final    Comment:    ==================================================================== TOXASSURE SELECT 13 (MW) ==================================================================== Test                             Result       Flag       Units Drug Present and Declared for Prescription Verification   Hydrocodone                    311          EXPECTED   ng/mg creat   Dihydrocodeine                 69           EXPECTED   ng/mg creat   Norhydrocodone                 328          EXPECTED   ng/mg creat    Sources of hydrocodone include scheduled prescription    medications. Dihydrocodeine and norhydrocodone are expected    metabolites of hydrocodone. Dihydrocodeine is also available as a    scheduled prescription medication. ==================================================================== Test                      Result    Flag   Units      Ref Range   Creatinine              303              mg/dL       >=20 ==================================================================== Declared Medications:  The flagging and interpretation on this report are based on the  following declared medications.  Unexpected results may arise from  inaccuracies in the declared medications.  **Note: The testing scope of this panel includes these medications:  Hydrocodone (Norco)  **Note: The testing scope of this panel does not include following  reported medications:  Acetaminophen  Acetaminophen (Norco)  Atenolol  Atorvastatin  Baclofen  Benzonatate  Buspirone  Chondroitin  Cyanocobalamin  Desloratadine (Clarinex-D)  Duloxetine (Cymbalta)  Fenofibrate  Fluticasone (Flonase)  Ibuprofen  Magnesium Oxide  Melatonin  Multivitamin  Omeprazole (Prilosec)  Ondansetron (Zofran)  Potassium  Prednisone  Pseudoephedrine (Clarinex-D)  Vitamin D3 ==================================================================== For clinical consultation, please call [phone] ====================================================================    UDS interpretation: Compliant          Medication Assessment Form: Reviewed. Patient indicates being compliant with therapy Treatment compliance: Compliant Risk Assessment Profile: Aberrant behavior: See prior evaluations. None observed or detected today Comorbid factors increasing risk of overdose: See prior notes. No  additional risks detected today Opioid risk tool (ORT) (Total Score): 1 Personal History of Substance Abuse (SUD-Substance use disorder):  Alcohol: Negative  Illegal Drugs: Negative  Rx Drugs: Negative  ORT Risk Level calculation: Low Risk Risk of substance use disorder (SUD): Low Opioid Risk Tool - 07/13/18 1115      Family History of Substance Abuse   Alcohol  Negative    Illegal Drugs  Negative    Rx Drugs  Negative      Personal History of Substance Abuse   Alcohol  Negative    Illegal Drugs  Negative    Rx Drugs  Negative      Age    Age between 71-45 years   No      History of Preadolescent Sexual Abuse   History of Preadolescent Sexual Abuse  Negative or Male      Psychological Disease   Psychological Disease  Negative    Depression  Positive   and anxiety     Total Score   Opioid Risk Tool Scoring  1    Opioid Risk Interpretation  Low Risk      ORT Scoring interpretation table:  Score <3 = Low Risk for SUD  Score between 4-7 = Moderate Risk for SUD  Score >8 = High Risk for Opioid Abuse   Risk Mitigation Strategies:  Patient Counseling: Covered Patient-Prescriber Agreement (PPA): Present and active  Notification to other healthcare providers: Done  Pharmacologic Plan: No change in therapy, at this time.             Laboratory Chemistry  Inflammation Markers (CRP: Acute Phase) (ESR: Chronic Phase) Lab Results  Component Value Date   CRP 1.4 (H) 11/12/2016   ESRSEDRATE 3 11/12/2016                         Rheumatology Markers Lab Results  Component Value Date   LABURIC 5.6 07/30/2015                        Renal Function Markers Lab Results  Component Value Date   BUN 15 04/10/2018   CREATININE 1.13 04/10/2018   BCR 13 04/10/2018   GFRAA 87 04/10/2018   GFRNONAA 75 04/10/2018                             Hepatic Function Markers Lab Results  Component Value Date   AST 29 04/10/2018   ALT 36 04/10/2018   ALBUMIN 4.4 04/10/2018   ALKPHOS 48 04/10/2018                        Electrolytes Lab Results  Component Value Date   NA 141 04/10/2018   K 4.2 04/10/2018   CL 101 04/10/2018   CALCIUM 9.5 04/10/2018   MG 2.0 11/12/2016                        Neuropathy Markers Lab Results  Component Value Date   VITAMINB12 820 11/12/2016   HIV Non Reactive 04/10/2018                        CNS Tests No results found for: SDES, GRAMSTAIN, CULT, COLORCSF, APPEARCSF, RBCCOUNTCSF, WBCCSF, POLYSCSF, LYMPHSCSF, EOSCSF, PROTEINCSF, GLUCCSF, JCVIRUS, CSFOLI, IGGCSF, IGGALB, IGGIND  Bone Pathology Markers Lab Results  Component Value Date   25OHVITD1 23 (L) 11/12/2016   25OHVITD2 <1.0 11/12/2016   25OHVITD3 23 11/12/2016                         Coagulation Parameters Lab Results  Component Value Date   PLT 346 04/10/2018                        Cardiovascular Markers Lab Results  Component Value Date   HGB 15.0 04/10/2018   HCT 44.5 04/10/2018                         CA Markers No results found for: CEA, CA125, LABCA2                      Note: Lab results reviewed.  Recent Diagnostic Imaging Results  DG C-Arm 1-60 Min-No Report Fluoroscopy was utilized by the requesting physician.  No radiographic  interpretation.   Complexity Note: Imaging results reviewed. Results shared with Mr. Detter, using Layman's terms.                         Meds   Current Outpatient Medications:  .  acetaminophen (TYLENOL) 500 MG tablet, Take 500 mg by mouth every 6 (six) hours as needed., Disp: , Rfl:  .  atenolol (TENORMIN) 50 MG tablet, Take 1 tablet (50 mg total) by mouth daily., Disp: 90 tablet, Rfl: 1 .  atorvastatin (LIPITOR) 40 MG tablet, Take 1 tablet (40 mg total) by mouth daily., Disp: 90 tablet, Rfl: 1 .  baclofen (LIORESAL) 10 MG tablet, Take 1 tablet (10 mg total) by mouth at bedtime., Disp: 90 tablet, Rfl: 0 .  busPIRone (BUSPAR) 30 MG tablet, Take 1 tablet (30 mg total) by mouth 2 (two) times daily., Disp: 180 tablet, Rfl: 1 .  Calcium Carbonate (CALCIUM 600 PO), Take 600 mg by mouth daily., Disp: , Rfl:  .  Cholecalciferol (VITAMIN D3) 1000 units CAPS, Take 1 capsule by mouth daily., Disp: , Rfl:  .  Chondroitin Sulfate 400 MG CAPS, Take by mouth 2 (two) times daily., Disp: , Rfl:  .  desloratadine-pseudoephedrine (CLARINEX-D 12-HOUR) 2.5-120 MG 12 hr tablet, Take 1 tablet by mouth as needed., Disp: , Rfl:  .  DiphenhydrAMINE HCl (DIPHEDRYL ALLERGY PO), Take 25 mg by mouth daily., Disp: , Rfl:  .  DULoxetine (CYMBALTA) 30 MG capsule, Take  1 capsule (30 mg total) by mouth 2 (two) times daily., Disp: 180 capsule, Rfl: 1 .  fenofibrate 160 MG tablet, Take 1 tablet (160 mg total) by mouth daily., Disp: 90 tablet, Rfl: 1 .  fluticasone (FLONASE) 50 MCG/ACT nasal spray, Place 2 sprays into both nostrils 2 (two) times daily., Disp: , Rfl:  .  Glucosamine-Chondroit-Vit C-Mn (GLUCOSAMINE 1500 COMPLEX PO), Take by mouth 2 (two) times daily., Disp: , Rfl:  .  [START ON 09/29/2018] HYDROcodone-acetaminophen (NORCO/VICODIN) 5-325 MG tablet, Take 1 tablet by mouth daily as needed for moderate pain., Disp: 15 tablet, Rfl: 0 .  ibuprofen (ADVIL,MOTRIN) 200 MG tablet, Take 200 mg by mouth 3 (three) times a week., Disp: , Rfl:  .  magnesium oxide (MAG-OX) 400 MG tablet, Take 400 mg by mouth daily., Disp: , Rfl:  .  Melatonin 5 MG TABS, Take by mouth at bedtime. , Disp: , Rfl:  .  Methylsulfonylmethane (MSM PO), Take by mouth 2 (two) times daily., Disp: , Rfl:  .  Multiple Vitamin (MULTIVITAMIN) tablet, Take 1 tablet by mouth daily., Disp: , Rfl:  .  omeprazole (PRILOSEC) 20 MG capsule, Take 1 capsule (20 mg total) by mouth daily., Disp: 90 capsule, Rfl: 1 .  ondansetron (ZOFRAN) 8 MG tablet, Take 1 tablet (8 mg total) by mouth 2 (two) times daily., Disp: 180 tablet, Rfl: 3 .  Potassium 99 MG TABS, Take 1 tablet by mouth., Disp: , Rfl:  .  vitamin B-12 (CYANOCOBALAMIN) 1000 MCG tablet, Take 1,000 mcg by mouth daily., Disp: , Rfl:  .  [START ON 07/31/2018] HYDROcodone-acetaminophen (NORCO/VICODIN) 5-325 MG tablet, Take 1 tablet by mouth daily as needed for moderate pain., Disp: 15 tablet, Rfl: 0 .  [START ON 08/30/2018] HYDROcodone-acetaminophen (NORCO/VICODIN) 5-325 MG tablet, Take 1 tablet by mouth daily as needed for moderate pain., Disp: 15 tablet, Rfl: 0  ROS  Constitutional: Denies any fever or chills Gastrointestinal: No reported hemesis, hematochezia, vomiting, or acute GI distress Musculoskeletal: Denies any acute onset joint swelling,  redness, loss of ROM, or weakness Neurological: No reported episodes of acute onset apraxia, aphasia, dysarthria, agnosia, amnesia, paralysis, loss of coordination, or loss of consciousness  Allergies  Mr. Coles has No Known Allergies.  [place]  Drug: Mr. Arroyave  reports that he does not use drugs. Alcohol:  reports that he does not drink alcohol. Tobacco:  reports that he has never smoked. He has never used smokeless tobacco. Medical:  has a past medical history of Allergy, Anxiety, Chronic duodenal ulcer with hemorrhage (2012), Chronic neck pain, Depression, Hyperlipidemia, Hypertension, and Microscopic hematuria. Surgical: Mr. Koska  has a past surgical history that includes eye muscle repair [id number] and 1975) and bLEEDING ULCER (2012). Family: family history includes Allergies in his brother; Cancer in his maternal grandfather and mother; Dementia in his father and paternal grandfather; Depression in his father; Diabetes in his mother; Mental illness in his father; Stroke in his paternal grandfather.  Constitutional Exam  General appearance: Well nourished, well developed, and well hydrated. In no apparent acute distress Vitals:   07/13/18 1109  BP: 122/81  Pulse: 89  Resp: 16  Temp: 98.1 F (36.7 C)  TempSrc: Oral  SpO2: 98%  Weight: 184 lb (83.5 kg)  Height: _0  (1.702 m)   Psych/Mental status: Alert, oriented x 3 (person, place, & time)       Eyes: PERLA Respiratory: No evidence of acute respiratory distress  Cervical Spine Area Exam  Skin & Axial Inspection: No masses, redness, edema, swelling, or associated skin lesions Alignment: Symmetrical Functional ROM: Unrestricted ROM      Stability: No instability detected Muscle Tone/Strength: Functionally intact. No obvious neuro-muscular anomalies detected. Sensory (Neurological): Unimpaired Palpation: No palpable anomalies              Upper Extremity (UE) Exam    Side: Right upper extremity  Side: Left upper extremity   Skin & Extremity Inspection: Skin color, temperature, and hair growth are WNL. No peripheral edema or cyanosis. No masses, redness, swelling, asymmetry, or associated skin lesions. No contractures.  Skin & Extremity Inspection: Skin color, temperature, and hair growth are WNL. No peripheral edema or cyanosis. No masses, redness, swelling, asymmetry, or associated skin lesions. No contractures.  Functional ROM: Unrestricted ROM          Functional ROM: Unrestricted ROM          Muscle Tone/Strength: Functionally intact. No obvious neuro-muscular  anomalies detected.  Muscle Tone/Strength: Functionally intact. No obvious neuro-muscular anomalies detected.  Sensory (Neurological): Unimpaired          Sensory (Neurological): Unimpaired          Palpation: No palpable anomalies              Palpation: No palpable anomalies              Provocative Test(s):  Phalen's test: deferred Tinel's test: deferred Apley's scratch test (touch opposite shoulder):  Action 1 (Across chest): deferred Action 2 (Overhead): deferred Action 3 (LB reach): deferred   Provocative Test(s):  Phalen's test: deferred Tinel's test: deferred Apley's scratch test (touch opposite shoulder):  Action 1 (Across chest): deferred Action 2 (Overhead): deferred Action 3 (LB reach): deferred    Thoracic Spine Area Exam  Skin & Axial Inspection: No masses, redness, or swelling Alignment: Symmetrical Functional ROM: Unrestricted ROM Stability: No instability detected Muscle Tone/Strength: Functionally intact. No obvious neuro-muscular anomalies detected. Sensory (Neurological): Unimpaired Muscle strength & Tone: No palpable anomalies  Lumbar Spine Area Exam  Skin & Axial Inspection: No masses, redness, or swelling Alignment: Symmetrical Functional ROM: Unrestricted ROM       Stability: No instability detected Muscle Tone/Strength: Functionally intact. No obvious neuro-muscular anomalies detected. Sensory (Neurological):  Unimpaired Palpation: Complains of area being tender to palpation       Provocative Tests: Hyperextension/rotation test: Positive bilaterally for facet joint pain.   Gait & Posture Assessment  Ambulation: Unassisted Gait: Relatively normal for age and body habitus Posture: WNL   Lower Extremity Exam    Side: Right lower extremity  Side: Left lower extremity  Stability: No instability observed          Stability: No instability observed          Skin & Extremity Inspection: Skin color, temperature, and hair growth are WNL. No peripheral edema or cyanosis. No masses, redness, swelling, asymmetry, or associated skin lesions. No contractures.  Skin & Extremity Inspection: Skin color, temperature, and hair growth are WNL. No peripheral edema or cyanosis. No masses, redness, swelling, asymmetry, or associated skin lesions. No contractures.  Functional ROM: Unrestricted ROM                  Functional ROM: Unrestricted ROM                  Muscle Tone/Strength: Functionally intact. No obvious neuro-muscular anomalies detected.  Muscle Tone/Strength: Functionally intact. No obvious neuro-muscular anomalies detected.  Sensory (Neurological): Movement-associated pain  Sensory (Neurological): Movement-associated pain  Palpation: No palpable anomalies  Palpation: No palpable anomalies   Assessment  Primary Diagnosis & Pertinent Problem List: The primary encounter diagnosis was Chronic low back pain (Location of Secondary source of pain) (Bilateral) (L>R). Diagnoses of Chronic bilateral low back pain, with sciatica presence unspecified, Grade 1 Retrolisthesis of L3 over L4, Muscle spasticity, Chronic pain syndrome, and Long term prescription opiate use were also pertinent to this visit.  Status Diagnosis  Persistent Controlled Persistent 1. Chronic low back pain (Location of Secondary source of pain) (Bilateral) (L>R)   2. Chronic bilateral low back pain, with sciatica presence unspecified   3.  Grade 1 Retrolisthesis of L3 over L4   4. Muscle spasticity   5. Chronic pain syndrome   6. Long term prescription opiate use     Problems updated and reviewed during this visit: No problems updated. Plan of Care  Pharmacotherapy (Medications Ordered): Meds ordered this encounter  Medications  .  baclofen (LIORESAL) 10 MG tablet    Sig: Take 1 tablet (10 mg total) by mouth at bedtime.    Dispense:  90 tablet    Refill:  0    Do not place medication on "Automatic Refill". Fill one day early if pharmacy is closed on scheduled refill date.    Order Specific Question:   Supervising Provider    Answer:   Milinda Pointer [phone]  . HYDROcodone-acetaminophen (NORCO/VICODIN) 5-325 MG tablet    Sig: Take 1 tablet by mouth daily as needed for moderate pain.    Dispense:  15 tablet    Refill:  0    Fill one day early if pharmacy is closed on scheduled refill date. Do not fill until: 09/29/2018 To last until:10/29/2018    Order Specific Question:   Supervising Provider    Answer:   Milinda Pointer [phone]  . HYDROcodone-acetaminophen (NORCO/VICODIN) 5-325 MG tablet    Sig: Take 1 tablet by mouth daily as needed for moderate pain.    Dispense:  15 tablet    Refill:  0    Fill one day early if pharmacy is closed on scheduled refill date. Do not fill until:07/31/2018 To last until:08/30/2018    Order Specific Question:   Supervising Provider    Answer:   Milinda Pointer [phone]  . HYDROcodone-acetaminophen (NORCO/VICODIN) 5-325 MG tablet    Sig: Take 1 tablet by mouth daily as needed for moderate pain.    Dispense:  15 tablet    Refill:  0    Fill one day early if pharmacy is closed on scheduled refill date. Do not fill until:08/30/2018 To last until:09/29/2018    Order Specific Question:   Supervising Provider    Answer:   Milinda Pointer [[id number]]   New Prescriptions   No medications on file   Medications administered today: Sallee Lange. Bloyd had no medications  administered during this visit. Lab-work, procedure(s), and/or referral(s): Orders Placed This Encounter  Procedures  . Lumbar Epidural Injection  . ToxASSURE Select 13 (MW), Urine   Imaging and/or referral(s): None  Interventional management options: Planned, scheduled, and/or pending: Not at this time    Considering: Diagnostic bilateral lumbar facetblock  Possible bilateral lumbar facet RFA Palliative left-sided cervical epidural steroid injection#2 Diagnostic bilateral cervical facetblock  Possible bilateral cervical facet RFA Diagnostic bilateral sacroiliac joint block Possible bilateral sacroiliac joint RFA Diagnostic trigger point injections   Palliative PRN treatment(s): Diagnostic left L4-5 interlaminar lumbar epidural steroid injection #2 Palliative left-sided cervical epidural steroid injection#2    Provider-requested follow-up: Return in about 3 months (around 10/12/2018) for MedMgmt with Me Donella Stade Edison Pace).  Future Appointments  Date Time Provider [place]  07/25/2018 12:45 PM Milinda Pointer, MD [facility] None  10/11/2018 10:45 AM Vevelyn Francois, NP [facility] None  10/18/2018 10:30 AM Volney American, PA-C [facility]   Primary Care Physician: Kathrine Haddock, NP Location: [facility] Facility Note by: Vevelyn Francois NP Date: 07/13/2018; Time: 3:56 PM  Pain Score Disclaimer: We use the NRS-11 scale. This is a self-reported, subjective measurement of pain severity with only modest accuracy. It is used primarily to identify changes within a particular patient. It must be understood that outpatient pain scales are significantly less accurate that those used for research, where they can be applied under ideal controlled circumstances with minimal exposure to variables. In reality, the score is likely to be a combination of pain intensity and pain affect, where pain affect describes the degree of  emotional  arousal or changes in action readiness caused by the sensory experience of pain. Factors such as social and work situation, setting, emotional state, anxiety levels, expectation, and prior pain experience may influence pain perception and show large inter-individual differences that may also be affected by time variables.  Patient instructions provided during this appointment: Patient Instructions  ____________________________________________________________________________________________  Medication Rules  Applies to: All patients receiving prescriptions (written or electronic).  Pharmacy of record: Pharmacy where electronic prescriptions will be sent. If written prescriptions are taken to a different pharmacy, please inform the nursing staff. The pharmacy listed in the electronic medical record should be the one where you would like electronic prescriptions to be sent.  Prescription refills: Only during scheduled appointments. Applies to both, written and electronic prescriptions.  NOTE: The following applies primarily to controlled substances (Opioid* Pain Medications).   Patient's responsibilities: 1. Pain Pills: Bring all pain pills to every appointment (except for procedure appointments). 2. Pill Bottles: Bring pills in original pharmacy bottle. Always bring newest bottle. Bring bottle, even if empty. 3. Medication refills: You are responsible for knowing and keeping track of what medications you need refilled. The day before your appointment, write a list of all prescriptions that need to be refilled. Bring that list to your appointment and give it to the admitting nurse. Prescriptions will be written only during appointments. If you forget a medication, it will not be "Called in", "Faxed", or "electronically sent". You will need to get another appointment to get these prescribed. 4. Prescription Accuracy: You are responsible for carefully inspecting your prescriptions before leaving our  office. Have the discharge nurse carefully go over each prescription with you, before taking them home. Make sure that your name is accurately spelled, that your address is correct. Check the name and dose of your medication to make sure it is accurate. Check the number of pills, and the written instructions to make sure they are clear and accurate. Make sure that you are given enough medication to last until your next medication refill appointment. 5. Taking Medication: Take medication as prescribed. Never take more pills than instructed. Never take medication more frequently than prescribed. Taking less pills or less frequently is permitted and encouraged, when it comes to controlled substances (written prescriptions).  6. Inform other Doctors: Always inform, all of your healthcare providers, of all the medications you take. 7. Pain Medication from other Providers: You are not allowed to accept any additional pain medication from any other Doctor or Healthcare provider. There are two exceptions to this rule. (see below) In the event that you require additional pain medication, you are responsible for notifying us, as stated below. 8. Medication Agreement: You are responsible for carefully reading and following our Medication Agreement. This must be signed before receiving any prescriptions from our practice. Safely store a copy of your signed Agreement. Violations to the Agreement will result in no further prescriptions. (Additional copies of our Medication Agreement are available upon request.) 9. Laws, Rules, & Regulations: All patients are expected to follow all Federal and [employer], [employer], Rules, [employer]. Ignorance of the Laws does not constitute a valid excuse. The use of any illegal substances is prohibited. 10. Adopted CDC guidelines & recommendations: Target dosing levels will be at or below 60 MME/day. Use of benzodiazepines** is not recommended.  Exceptions: There are only two  exceptions to the rule of not receiving pain medications from other Healthcare Providers. 1. Exception #1 (Emergencies): In the event of an emergency (  i.e.: accident requiring emergency care), you are allowed to receive additional pain medication. However, you are responsible for: As soon as you are able, call our office [phone], at any time of the day or night, and leave a message stating your name, the date and nature of the emergency, and the name and dose of the medication prescribed. In the event that your call is answered by a member of our staff, make sure to document and save the date, time, and the name of the person that took your information.  2. Exception #2 (Planned Surgery): In the event that you are scheduled by another doctor or dentist to have any type of surgery or procedure, you are allowed (for a period no longer than 30 days), to receive additional pain medication, for the acute post-op pain. However, in this case, you are responsible for picking up a copy of our "Post-op Pain Management for Surgeons" handout, and giving it to your surgeon or dentist. This document is available at our office, and does not require an appointment to obtain it. Simply go to our office during business hours (Monday-Thursday from 8:00 AM to 4:00 PM) (Friday 8:00 AM to 12:00 Noon) or if you have a scheduled appointment with [place], prior to your surgery, and ask for it by name. In addition, you will need to provide [place] with your name, name of your surgeon, type of surgery, and date of procedure or surgery.  *Opioid medications include: morphine, codeine, oxycodone, oxymorphone, hydrocodone, hydromorphone, meperidine, tramadol, tapentadol, buprenorphine, fentanyl, methadone. **Benzodiazepine medications include: diazepam (Valium), alprazolam (Xanax), clonazepam (Klonopine), lorazepam (Ativan), clorazepate (Tranxene), chlordiazepoxide (Librium), estazolam (Prosom), oxazepam (Serax), temazepam (Restoril),  triazolam (Halcion) (Last updated: 12/29/2017) ____________________________________________________________________________________________   ____________________________________________________________________________________________  General Risks and Possible Complications  Patient Responsibilities: It is important that you read this as it is part of your informed consent. It is our duty to inform you of the risks and possible complications associated with treatments offered to you. It is your responsibility as a patient to read this and to ask questions about anything that is not clear or that you believe was not covered in this document.  Patient's Rights: You have the right to refuse treatment. You also have the right to change your mind, even after initially having agreed to have the treatment done. However, under this last option, if you wait until the last second to change your mind, you may be charged for the materials used up to that point.  Introduction: Medicine is not an Chief Strategy Officer. Everything in Medicine, including the lack of treatment(s), carries the potential for danger, harm, or loss (which is by definition: Risk). In Medicine, a complication is a secondary problem, condition, or disease that can aggravate an already existing one. All treatments carry the risk of possible complications. The fact that a side effects or complications occurs, does not imply that the treatment was conducted incorrectly. It must be clearly understood that these can happen even when everything is done following the highest safety standards.  No treatment: You can choose not to proceed with the proposed treatment alternative. The "PRO(s)" would include: avoiding the risk of complications associated with the therapy. The "CON(s)" would include: not getting any of the treatment benefits. These benefits fall under one of three categories: diagnostic; therapeutic; and/or palliative. Diagnostic benefits  include: getting information which can ultimately lead to improvement of the disease or symptom(s). Therapeutic benefits are those associated with the successful treatment of the disease. Finally, palliative benefits are  those related to the decrease of the primary symptoms, without necessarily curing the condition (example: decreasing the pain from a flare-up of a chronic condition, such as incurable terminal cancer).  General Risks and Complications: These are associated to most interventional treatments. They can occur alone, or in combination. They fall under one of the following six (6) categories: no benefit or worsening of symptoms; bleeding; infection; nerve damage; allergic reactions; and/or death. 1. No benefits or worsening of symptoms: In Medicine there are no guarantees, only probabilities. No healthcare provider can ever guarantee that a medical treatment will work, they can only state the probability that it may. Furthermore, there is always the possibility that the condition may worsen, either directly, or indirectly, as a consequence of the treatment. 2. Bleeding: This is more common if the patient is taking a blood thinner, either prescription or over the counter (example: Goody Powders, Fish oil, Aspirin, Garlic, etc.), or if suffering a condition associated with impaired coagulation (example: Hemophilia, cirrhosis of the liver, low platelet counts, etc.). However, even if you do not have one on these, it can still happen. If you have any of these conditions, or take one of these drugs, make sure to notify your treating physician. 3. Infection: This is more common in patients with a compromised immune system, either due to disease (example: diabetes, cancer, human immunodeficiency virus [HIV], etc.), or due to medications or treatments (example: therapies used to treat cancer and rheumatological diseases). However, even if you do not have one on these, it can still happen. If you have any of  these conditions, or take one of these drugs, make sure to notify your treating physician. 4. Nerve Damage: This is more common when the treatment is an invasive one, but it can also happen with the use of medications, such as those used in the treatment of cancer. The damage can occur to small secondary nerves, or to large primary ones, such as those in the spinal cord and brain. This damage may be temporary or permanent and it may lead to impairments that can range from temporary numbness to permanent paralysis and/or brain death. 5. Allergic Reactions: Any time a substance or material comes in contact with our body, there is the possibility of an allergic reaction. These can range from a mild skin rash (contact dermatitis) to a severe systemic reaction (anaphylactic reaction), which can result in death. 6. Death: In general, any medical intervention can result in death, most of the time due to an unforeseen complication. ____________________________________________________________________________________________  Epidural Steroid Injection Patient Information  Description: The epidural space surrounds the nerves as they exit the spinal cord.  In some patients, the nerves can be compressed and inflamed by a bulging disc or a tight spinal canal (spinal stenosis).  By injecting steroids into the epidural space, we can bring irritated nerves into direct contact with a potentially helpful medication.  These steroids act directly on the irritated nerves and can reduce swelling and inflammation which often leads to decreased pain.  Epidural steroids may be injected anywhere along the spine and from the neck to the low back depending upon the location of your pain.   After numbing the skin with local anesthetic (like Novocaine), a small needle is passed into the epidural space slowly.  You may experience a sensation of pressure while this is being done.  The entire block usually last less than 10  minutes.  Conditions which may be treated by epidural steroids:   Low back and  leg pain  Neck and arm pain  Spinal stenosis  Post-laminectomy syndrome  Herpes zoster (shingles) pain  Pain from compression fractures  Preparation for the injection:  3. Do not eat any solid food or dairy products within 8 hours of your appointment.  4. You may drink clear liquids up to 3 hours before appointment.  Clear liquids include water, black coffee, juice or soda.  No milk or cream please. 5. You may take your regular medication, including pain medications, with a sip of water before your appointment  Diabetics should hold regular insulin (if taken separately) and take 1/2 normal NPH dos the morning of the procedure.  Carry some sugar containing items with you to your appointment. 6. A driver must accompany you and be prepared to drive you home after your procedure.  7. Bring all your current medications with your. 8. An IV may be inserted and sedation may be given at the discretion of the physician.   9. A blood pressure cuff, EKG and other monitors will often be applied during the procedure.  Some patients may need to have extra oxygen administered for a short period. 10. You will be asked to provide medical information, including your allergies, prior to the procedure.  We must know immediately if you are taking blood thinners (like Coumadin/Warfarin)  Or if you are allergic to IV iodine contrast (dye). We must know if you could possible be pregnant.  Possible side-effects:  Bleeding from needle site  Infection (rare, may require surgery)  Nerve injury (rare)  Numbness & tingling (temporary)  Difficulty urinating (rare, temporary)  Spinal headache ( a headache worse with upright posture)  Light -headedness (temporary)  Pain at injection site (several days)  Decreased blood pressure (temporary)  Weakness in arm/leg (temporary)  Pressure sensation in back/neck  (temporary)  Call if you experience:  Fever/chills associated with headache or increased back/neck pain.  Headache worsened by an upright position.  New onset weakness or numbness of an extremity below the injection site  Hives or difficulty breathing (go to the emergency room)  Inflammation or drainage at the infection site  Severe back/neck pain  Any new symptoms which are concerning to you  Please note:  Although the local anesthetic injected can often make your back or neck feel good for several hours after the injection, the pain will likely return.  It takes 3-7 days for steroids to work in the epidural space.  You may not notice any pain relief for at least that one week.  If effective, we will often do a series of three injections spaced 3-6 weeks apart to maximally decrease your pain.  After the initial series, we generally will wait several months before considering a repeat injection of the same type.  If you have any questions, please call [phone] [facility]

## 2018-07-19 LAB — TOXASSURE SELECT 13 (MW), URINE

## 2018-07-25 ENCOUNTER — Encounter: Payer: Self-pay | Admitting: Pain Medicine

## 2018-07-25 ENCOUNTER — Ambulatory Visit
Admission: RE | Admit: 2018-07-25 | Discharge: 2018-07-25 | Disposition: A | Discharge status: Home / Self Care | Payer: 59 | Source: Ambulatory Visit | Attending: Pain Medicine | Admitting: Pain Medicine

## 2018-07-25 ENCOUNTER — Other Ambulatory Visit: Payer: Self-pay

## 2018-07-25 ENCOUNTER — Ambulatory Visit (HOSPITAL_BASED_OUTPATIENT_CLINIC_OR_DEPARTMENT_OTHER): Payer: 59 | Admitting: Pain Medicine

## 2018-07-25 VITALS — BP 125/83 | HR 71 | Temp 98.5°F | Resp 19 | Ht 67.0 in | Wt 185.0 lb

## 2018-07-25 DIAGNOSIS — G8929 Other chronic pain: Secondary | ICD-10-CM | POA: Diagnosis not present

## 2018-07-25 DIAGNOSIS — M5137 Other intervertebral disc degeneration, lumbosacral region: Secondary | ICD-10-CM | POA: Insufficient documentation

## 2018-07-25 DIAGNOSIS — M79605 Pain in left leg: Secondary | ICD-10-CM

## 2018-07-25 DIAGNOSIS — M545 Low back pain, unspecified: Secondary | ICD-10-CM

## 2018-07-25 DIAGNOSIS — M5136 Other intervertebral disc degeneration, lumbar region: Secondary | ICD-10-CM

## 2018-07-25 DIAGNOSIS — M47816 Spondylosis without myelopathy or radiculopathy, lumbar region: Secondary | ICD-10-CM | POA: Insufficient documentation

## 2018-07-25 DIAGNOSIS — M4310 Spondylolisthesis, site unspecified: Secondary | ICD-10-CM

## 2018-07-25 DIAGNOSIS — M51379 Other intervertebral disc degeneration, lumbosacral region without mention of lumbar back pain or lower extremity pain: Secondary | ICD-10-CM | POA: Insufficient documentation

## 2018-07-25 MED ORDER — SODIUM CHLORIDE 0.9% FLUSH
2.0000 mL | Freq: Once | INTRAVENOUS | Status: AC
  Administered 2018-07-25: 10 mL

## 2018-07-25 MED ORDER — TRIAMCINOLONE ACETONIDE 40 MG/ML IJ SUSP
40.0000 mg | Freq: Once | INTRAMUSCULAR | Status: AC
  Administered 2018-07-25: 40 mg
  Filled 2018-07-25: qty 1

## 2018-07-25 MED ORDER — SODIUM CHLORIDE 0.9 % IJ SOLN
INTRAMUSCULAR | Status: AC
  Filled 2018-07-25: qty 10

## 2018-07-25 MED ORDER — IOPAMIDOL (ISOVUE-M 200) INJECTION 41%
10.0000 mL | Freq: Once | INTRAMUSCULAR | Status: AC
  Administered 2018-07-25: 10 mL via EPIDURAL
  Filled 2018-07-25: qty 10

## 2018-07-25 MED ORDER — ROPIVACAINE HCL 2 MG/ML IJ SOLN
2.0000 mL | Freq: Once | INTRAMUSCULAR | Status: AC
  Administered 2018-07-25: 10 mL via EPIDURAL
  Filled 2018-07-25: qty 10

## 2018-07-25 MED ORDER — LIDOCAINE HCL 2 % IJ SOLN
20.0000 mL | Freq: Once | INTRAMUSCULAR | Status: AC
  Administered 2018-07-25: 400 mg
  Filled 2018-07-25: qty 20

## 2018-07-25 NOTE — Patient Instructions (Signed)

## 2018-07-25 NOTE — Progress Notes (Signed)
Patient's Name: Jose Washington  MRN: [id number]  Referring Provider: Kathrine Haddock, NP  DOB: [birth year]  PCP: Kathrine Haddock, NP  DOS: 07/25/2018  Note by: Gaspar Cola, MD  Service setting: Ambulatory outpatient  Specialty: Interventional Pain Management  Patient type: Established  Location: [facility] (AMB) Pain Management Facility  Visit type: Interventional Procedure   Primary Reason for Visit: Interventional Pain Management Treatment. CC: Back Pain (left, lower)  Procedure:          Anesthesia, Analgesia, Anxiolysis:  Type: Therapeutic Inter-Laminar Epidural Steroid Injection  #2  Region: Lumbar Level: L4-5 Level. Laterality: Left Paramedial  Type: Local Anesthesia Indication(s): Analgesia         Route: Infiltration ([facility]/IM) IV Access: Declined Sedation: Declined  Local Anesthetic: Lidocaine 1-2%   Position: Prone with head of the table was raised to facilitate breathing.   Indications: 1. DDD (degenerative disc disease), lumbar   2. Grade 1 Retrolisthesis of L3 over L4   3. Lumbar spondylosis   4. Chronic low back pain (Secondary source of pain) (Bilateral) (L>R)   5. Chronic lower extremity pain (Left)    Pain Score: Pre-procedure: 2 /10 Post-procedure: (P) 0-No pain/10  Pre-op Assessment:  Jose Washington is a 52 y.o. (year old), male patient, seen today for interventional treatment. He  has a past surgical history that includes eye muscle repair (1972 and 1975) and bLEEDING ULCER (2012). Jose Washington has a current medication list which includes the following prescription(s): acetaminophen, atenolol, atorvastatin, baclofen, buspirone, calcium carbonate, vitamin d3, chondroitin sulfate, desloratadine-pseudoephedrine, diphenhydramine hcl, duloxetine, fenofibrate, fluticasone, glucosamine-chondroit-vit c-mn, hydrocodone-acetaminophen, hydrocodone-acetaminophen, hydrocodone-acetaminophen, ibuprofen, magnesium oxide, melatonin, methylsulfonylmethane, multivitamin, omeprazole,  ondansetron, potassium, and vitamin b-12. His primarily concern today is the Back Pain (left, lower)  The patient comes into the clinic today indicating that he is here to have his usual left-sided L4-5 LESI under fluoroscopic guidance, no sedation.  However, he wanted me to know that he has been experiencing some new pain on the right side of his lower back.  Today I went over his lumbar MRI with him and there is clear evidence of lumbar facet hypertrophy, bilaterally, affecting the L3-4, L4-5, and L5-S1 levels.  This would explain his primary symptoms at this point.  I have talked to him about the possibility of doing a diagnostic bilateral lumbar facet block under fluoroscopic guidance and IV sedation, in the near future.  He understood and accepted.  Initial Vital Signs:  Pulse/HCG Rate: 71ECG Heart Rate: 80 Temp: 98.5 F (36.9 C) Resp: 18 BP: (!) 123/92 SpO2: 100 %  BMI: Estimated body mass index is 28.98 kg/m as calculated from the following:   Height as of this encounter: 5\' 7"  (1.702 m).   Weight as of this encounter: 185 lb (83.9 kg).  Risk Assessment: Allergies: Reviewed. He has No Known Allergies.  Allergy Precautions: None required Coagulopathies: Reviewed. None identified.  Blood-thinner therapy: None at this time Active Infection(s): Reviewed. None identified. Jose Washington is afebrile  Site Confirmation: Jose Washington was asked to confirm the procedure and laterality before marking the site Procedure checklist: Completed Consent: Before the procedure and under the influence of no sedative(s), amnesic(s), or anxiolytics, the patient was informed of the treatment options, risks and possible complications. To fulfill our ethical and legal obligations, as recommended by the American Medical Association's Code of Ethics, I have informed the patient of my clinical impression; the nature and purpose of the treatment or procedure; the risks, benefits, and possible complications of the  intervention; the alternatives, including doing nothing; the risk(s) and benefit(s) of the alternative treatment(s) or procedure(s); and the risk(s) and benefit(s) of doing nothing. The patient was provided information about the general risks and possible complications associated with the procedure. These may include, but are not limited to: failure to achieve desired goals, infection, bleeding, organ or nerve damage, allergic reactions, paralysis, and death. In addition, the patient was informed of those risks and complications associated to Spine-related procedures, such as failure to decrease pain; infection (i.e.: Meningitis, epidural or intraspinal abscess); bleeding (i.e.: epidural hematoma, subarachnoid hemorrhage, or any other type of intraspinal or peri-dural bleeding); organ or nerve damage (i.e.: Any type of peripheral nerve, nerve root, or spinal cord injury) with subsequent damage to sensory, motor, and/or autonomic systems, resulting in permanent pain, numbness, and/or weakness of one or several areas of the body; allergic reactions; (i.e.: anaphylactic reaction); and/or death. Furthermore, the patient was informed of those risks and complications associated with the medications. These include, but are not limited to: allergic reactions (i.e.: anaphylactic or anaphylactoid reaction(s)); adrenal axis suppression; blood sugar elevation that in diabetics may result in ketoacidosis or comma; water retention that in patients with history of congestive heart failure may result in shortness of breath, pulmonary edema, and decompensation with resultant heart failure; weight gain; swelling or edema; medication-induced neural toxicity; particulate matter embolism and blood vessel occlusion with resultant organ, and/or nervous system infarction; and/or aseptic necrosis of one or more joints. Finally, the patient was informed that Medicine is not an exact science; therefore, there is also the possibility of  unforeseen or unpredictable risks and/or possible complications that may result in a catastrophic outcome. The patient indicated having understood very clearly. We have given the patient no guarantees and we have made no promises. Enough time was given to the patient to ask questions, all of which were answered to the patient's satisfaction. Mr. Bardon has indicated that he wanted to continue with the procedure. Attestation: I, the ordering provider, attest that I have discussed with the patient the benefits, risks, side-effects, alternatives, likelihood of achieving goals, and potential problems during recovery for the procedure that I have provided informed consent. Date  Time: 07/25/2018 12:48 PM  Pre-Procedure Preparation:  Monitoring: As per clinic protocol. Respiration, ETCO2, SpO2, BP, heart rate and rhythm monitor placed and checked for adequate function Safety Precautions: Patient was assessed for positional comfort and pressure points before starting the procedure. Time-out: I initiated and conducted the "Time-out" before starting the procedure, as per protocol. The patient was asked to participate by confirming the accuracy of the "Time Out" information. Verification of the correct person, site, and procedure were performed and confirmed by me, the nursing staff, and the patient. "Time-out" conducted as per Joint Commission's Universal Protocol (UP.01.01.01). Time: 1332  Description of Procedure:          Target Area: The interlaminar space, initially targeting the lower laminar border of the superior vertebral body. Approach: Paramedial approach. Area Prepped: Entire Posterior Lumbar Region Prepping solution: ChloraPrep (2% chlorhexidine gluconate and 70% isopropyl alcohol) Safety Precautions: Aspiration looking for blood return was conducted prior to all injections. At no point did we inject any substances, as a needle was being advanced. No attempts were made at seeking any  paresthesias. Safe injection practices and needle disposal techniques used. Medications properly checked for expiration dates. SDV (single dose vial) medications used. Description of the Procedure: Protocol guidelines were followed. The procedure needle was introduced through the skin, ipsilateral to  the reported pain, and advanced to the target area. Bone was contacted and the needle walked caudad, until the lamina was cleared. The epidural space was identified using "loss-of-resistance technique" with 2-3 ml of PF-NaCl (0.9% NSS), in a 5cc LOR glass syringe.  Vitals:   07/25/18 1246 07/25/18 1329 07/25/18 1335 07/25/18 1340  BP: (!) 123/92 131/64 115/73 125/83  Pulse: 71     Resp: 18 14 (!) 21 19  Temp: 98.5 F (36.9 C)     TempSrc: Oral     SpO2: 100% 98% 98% 96%  Weight: 185 lb (83.9 kg)     Height: 5\' 7"  (1.702 m)       Start Time: 1332 hrs. End Time: 1339 hrs.  Materials:  Needle(s) Type: Epidural needle Gauge: 17G Length: 3.5-in Medication(s): Please see orders for medications and dosing details.  Imaging Guidance (Spinal):          Type of Imaging Technique: Fluoroscopy Guidance (Spinal) Indication(s): Assistance in needle guidance and placement for procedures requiring needle placement in or near specific anatomical locations not easily accessible without such assistance. Exposure Time: Please see nurses notes. Contrast: Before injecting any contrast, we confirmed that the patient did not have an allergy to iodine, shellfish, or radiological contrast. Once satisfactory needle placement was completed at the desired level, radiological contrast was injected. Contrast injected under live fluoroscopy. No contrast complications. See chart for type and volume of contrast used. Fluoroscopic Guidance: I was personally present during the use of fluoroscopy. "Tunnel Vision Technique" used to obtain the best possible view of the target area. Parallax error corrected before commencing the  procedure. "Direction-depth-direction" technique used to introduce the needle under continuous pulsed fluoroscopy. Once target was reached, antero-posterior, oblique, and lateral fluoroscopic projection used confirm needle placement in all planes. Images permanently stored in EMR. Interpretation: I personally interpreted the imaging intraoperatively. Adequate needle placement confirmed in multiple planes. Appropriate spread of contrast into desired area was observed. No evidence of afferent or efferent intravascular uptake. No intrathecal or subarachnoid spread observed. Permanent images saved into the patient's record.  Antibiotic Prophylaxis:   Anti-infectives (From admission, onward)   None     Indication(s): None identified  Post-operative Assessment:  Post-procedure Vital Signs:  Pulse/HCG Rate: 7177 Temp: 98.5 F (36.9 C) Resp: 19 BP: 125/83 SpO2: 96 %  EBL: None  Complications: No immediate post-treatment complications observed by team, or reported by patient.  Note: The patient tolerated the entire procedure well. A repeat set of vitals were taken after the procedure and the patient was kept under observation following institutional policy, for this type of procedure. Post-procedural neurological assessment was performed, showing return to baseline, prior to discharge. The patient was provided with post-procedure discharge instructions, including a section on how to identify potential problems. Should any problems arise concerning this procedure, the patient was given instructions to immediately contact us, at any time, without hesitation. In any case, we plan to contact the patient by telephone for a follow-up status report regarding this interventional procedure.  Comments:  No additional relevant information.  Plan of Care    Imaging Orders     DG C-Arm 1-60 Min-No Report  Procedure Orders     Lumbar Epidural Injection     LUMBAR FACET(MEDIAL BRANCH NERVE BLOCK)  MBNB  Medications ordered for procedure: Meds ordered this encounter  Medications  . iopamidol (ISOVUE-M) 41 % intrathecal injection 10 mL    Must be Myelogram-compatible. If not available, you may substitute with a  water-soluble, non-ionic, hypoallergenic, myelogram-compatible radiological contrast medium.  Marland Kitchen lidocaine (XYLOCAINE) 2 % (with pres) injection 400 mg  . sodium chloride flush (NS) 0.9 % injection 2 mL  . ropivacaine (PF) 2 mg/mL (0.2%) (NAROPIN) injection 2 mL  . triamcinolone acetonide (KENALOG-40) injection 40 mg   Medications administered: We administered iopamidol, lidocaine, sodium chloride flush, ropivacaine (PF) 2 mg/mL (0.2%), and triamcinolone acetonide.  See the medical record for exact dosing, route, and time of administration.  New Prescriptions   No medications on file   Disposition: Discharge home  Discharge Date & Time: 07/25/2018; 1341 hrs.   Physician-requested Follow-up: Return for post-procedure eval (2 wks), w/ Dr. Dossie Arbour.  Future Appointments  Date Time Provider [place]  08/21/2018 10:15 AM Milinda Pointer, MD [facility] None  10/11/2018 10:45 AM Vevelyn Francois, NP [facility] None  10/18/2018 10:30 AM Volney American, PA-C [facility]   Primary Care Physician: Kathrine Haddock, NP Location: [facility] Out[facility] Management Facility Note by: Gaspar Cola, MD Date: 07/25/2018; Time: 2:05 PM  Disclaimer:  Medicine is not an Chief Strategy Officer. The only guarantee in medicine is that nothing is guaranteed. It is important to note that the decision to proceed with this intervention was based on the information collected from the patient. The Data and conclusions were drawn from the patient's questionnaire, the interview, and the physical examination. Because the information was provided in large part by the patient, it cannot be guaranteed that it has not been purposely or unconsciously manipulated. Every effort has been made to obtain  as much relevant data as possible for this evaluation. It is important to note that the conclusions that lead to this procedure are derived in large part from the available data. Always take into account that the treatment will also be dependent on availability of resources and existing treatment guidelines, considered by other Pain Management Practitioners as being common knowledge and practice, at the time of the intervention. For Medico-Legal purposes, it is also important to point out that variation in procedural techniques and pharmacological choices are the acceptable norm. The indications, contraindications, technique, and results of the above procedure should only be interpreted and judged by a Board-Certified Interventional Pain Specialist with extensive familiarity and expertise in the same exact procedure and technique.

## 2018-07-26 ENCOUNTER — Telehealth: Payer: Self-pay | Admitting: *Deleted

## 2018-07-26 NOTE — Telephone Encounter (Signed)
Voicemail left to call our office if there are questions or procedures.

## 2018-08-21 ENCOUNTER — Other Ambulatory Visit: Payer: Self-pay | Admitting: Unknown Physician Specialty

## 2018-08-21 ENCOUNTER — Ambulatory Visit: Payer: 59 | Admitting: Pain Medicine

## 2018-08-22 ENCOUNTER — Other Ambulatory Visit: Payer: Self-pay | Admitting: Unknown Physician Specialty

## 2018-08-22 NOTE — Telephone Encounter (Signed)
Refill requests approved for Buspar and Atenolol.

## 2018-08-22 NOTE — Telephone Encounter (Signed)
Routing to close.  °

## 2018-08-22 NOTE — Telephone Encounter (Signed)
Requested medication (s) are due for refill today: Yes  Requested medication (s) are on the active medication list: Yes  Last refill:  02/24/18  Future visit scheduled: Yes  Notes to clinic:  Unable to refill per protocol     Requested Prescriptions  Pending Prescriptions Disp Refills   busPIRone (BUSPAR) 30 MG tablet [Pharmacy Med Name: BUSPIRONE HCL 30 MG TABLET] 180 tablet 1    Sig: TAKE 1 TABLET (30 MG TOTAL) BY MOUTH 2 (TWO) TIMES DAILY.     Not Delegated - Psychiatry:  Anxiolytics/Hypnotics Failed - 08/21/2018  1:50 PM      Failed - This refill cannot be delegated      Failed - Urine Drug Screen completed in last 360 days.      Passed - Valid encounter within last 6 months    Recent Outpatient Visits          4 months ago Healthcare maintenance   [place], NP   5 months ago Essential hypertension, benign   [facility] Kathrine Haddock, NP   7 months ago Upper respiratory tract infection, unspecified type   [facility], [name], [place]   1 year ago Essential hypertension, benign   [place] Kathrine Haddock, NP   1 year ago Chronic neck pain (Location of Primary Source of Pain) (Bilateral) (L>R)   [facility] Kathrine Haddock, NP      Future Appointments            In 1 month Orene Desanctis, [name], PA-C [place], [facility]         Signed Prescriptions Disp Refills   atenolol (TENORMIN) 50 MG tablet 90 tablet 1    Sig: TAKE 1 TABLET BY MOUTH EVERY DAY     Cardiovascular:  Beta Blockers Passed - 08/21/2018  1:50 PM      Passed - Last BP in normal range    BP Readings from Last 1 Encounters:  07/25/18 125/83         Passed - Last Heart Rate in normal range    Pulse Readings from Last 1 Encounters:  07/25/18 71         Passed - Valid encounter within last 6 months    Recent Outpatient Visits          4 months ago Healthcare maintenance   [place], NP   5 months ago Essential hypertension, benign   [facility] Kathrine Haddock, NP   7 months ago Upper respiratory tract infection, unspecified type   [facility], [name], [place]   1 year ago Essential hypertension, benign   [facility] Kathrine Haddock, NP   1 year ago Chronic neck pain (Location of Primary Source of Pain) (Bilateral) (L>R)   [facility] Kathrine Haddock, NP      Future Appointments            In 1 month Orene Desanctis, [name], [place], [facility]

## 2018-09-07 ENCOUNTER — Other Ambulatory Visit: Payer: Self-pay | Admitting: Unknown Physician Specialty

## 2018-10-03 ENCOUNTER — Encounter: Payer: Self-pay | Admitting: Family Medicine

## 2018-10-03 ENCOUNTER — Ambulatory Visit (INDEPENDENT_AMBULATORY_CARE_PROVIDER_SITE_OTHER): Payer: 59 | Admitting: Family Medicine

## 2018-10-03 VITALS — BP 121/81 | HR 60 | Temp 97.5°F | Ht 67.0 in | Wt 187.9 lb

## 2018-10-03 DIAGNOSIS — I10 Essential (primary) hypertension: Secondary | ICD-10-CM | POA: Diagnosis not present

## 2018-10-03 DIAGNOSIS — E785 Hyperlipidemia, unspecified: Secondary | ICD-10-CM | POA: Diagnosis not present

## 2018-10-03 DIAGNOSIS — F325 Major depressive disorder, single episode, in full remission: Secondary | ICD-10-CM

## 2018-10-03 MED ORDER — ATORVASTATIN CALCIUM 40 MG PO TABS: 40.0000 mg | ORAL_TABLET | Freq: Every day | ORAL | 1 refills | Status: DC

## 2018-10-03 MED ORDER — HYDROXYZINE HCL 25 MG PO TABS: 25.0000 mg | ORAL_TABLET | Freq: Three times a day (TID) | ORAL | 1 refills | Status: DC | PRN

## 2018-10-03 MED ORDER — FENOFIBRATE 160 MG PO TABS: 160.0000 mg | ORAL_TABLET | Freq: Every day | ORAL | 1 refills | Status: DC

## 2018-10-03 MED ORDER — DULOXETINE HCL 30 MG PO CPEP: 30.0000 mg | ORAL_CAPSULE | Freq: Two times a day (BID) | ORAL | 1 refills | Status: DC

## 2018-10-03 NOTE — Progress Notes (Signed)
BP 121/81 (BP Location: Left Arm, Patient Position: Sitting, Cuff Size: Normal)   Pulse 60   Temp (!) 97.5 F (36.4 C) (Oral)   Ht 5\' 7"  (1.702 m)   Wt 187 lb 14.4 oz (85.2 kg)   SpO2 98%   BMI 29.43 kg/m    Subjective:    Patient ID: Jose Washington, male    DOB: 07/13/1966, 52 y.o.   MRN: [id number]  HPI: Jose Washington is a 52 y.o. male  Chief Complaint  Patient presents with  . Follow-up    6 month  . Hypertension  . Hyperlipidemia  . Depression   Here today for 6 month f/u. Taking his medications faithfully without issues, side effects.   Not checking BPs at home. Denies CP, SOB, dizziness, HAs. Tolerating lipitor well without myalgias or claudication.   Taking buspar and cymbalta which seems to be doing well for his anxiety and depression typically. Very busy season at work with the holidays so finding himself more anxious and irritable, wanting something prn to help with this holiday season. Denies SI/HI, sleep or appetite issues.   Not fasting today for labs  Depression screen [facility] 2/9 07/25/2018 07/13/2018 04/12/2018  Decreased Interest 0 0 0  Down, Depressed, Hopeless 0 0 0  PHQ - 2 Score 0 0 0  Altered sleeping - - 0  Tired, decreased energy - - 0  Change in appetite - - 0  Feeling bad or failure about yourself  - - 0  Trouble concentrating - - 0  Moving slowly or fidgety/restless - - 0  Suicidal thoughts - - 0  PHQ-9 Score - - 0  Some recent data might be hidden     Relevant past medical, surgical, family and social history reviewed and updated as indicated. Interim medical history since our last visit reviewed. Allergies and medications reviewed and updated.  Review of Systems  Per HPI unless specifically indicated above     Objective:    BP 121/81 (BP Location: Left Arm, Patient Position: Sitting, Cuff Size: Normal)   Pulse 60   Temp (!) 97.5 F (36.4 C) (Oral)   Ht 5\' 7"  (1.702 m)   Wt 187 lb 14.4 oz (85.2 kg)   SpO2 98%   BMI 29.43  kg/m   Wt Readings from Last 3 Encounters:  10/03/18 187 lb 14.4 oz (85.2 kg)  07/25/18 185 lb (83.9 kg)  07/13/18 184 lb (83.5 kg)    Physical Exam  Constitutional: He is oriented to person, place, and time. He appears well-developed and well-nourished. No distress.  HENT:  Head: Atraumatic.  Eyes: Conjunctivae and EOM are normal.  Neck: Normal range of motion. Neck supple.  Cardiovascular: Normal rate, regular rhythm and normal heart sounds.  Pulmonary/Chest: Effort normal and breath sounds normal.  Musculoskeletal: Normal range of motion.  Neurological: He is alert and oriented to person, place, and time.  Skin: Skin is warm and dry.  Psychiatric: He has a normal mood and affect. His behavior is normal. Thought content normal.  Nursing note and vitals reviewed.   Results for orders placed or performed in visit on 10/03/18  Comprehensive metabolic panel  Result Value Ref Range   Glucose 128 (H) 65 - 99 mg/dL   BUN 9 6 - 24 mg/dL   Creatinine, Ser 0.99 0.76 - 1.27 mg/dL   GFR calc non Af Amer 87 >59 mL/min/1.73   GFR calc Af Amer 101 >59 mL/min/1.73   BUN/Creatinine Ratio 9 9 -  20   Sodium 140 134 - 144 mmol/L   Potassium 4.0 3.5 - 5.2 mmol/L   Chloride 101 96 - 106 mmol/L   CO2 23 20 - 29 mmol/L   Calcium 9.7 8.7 - 10.2 mg/dL   Total Protein 6.5 6.0 - 8.5 g/dL   Albumin 4.5 3.5 - 5.5 g/dL   Globulin, Total 2.0 1.5 - 4.5 g/dL   Albumin/Globulin Ratio 2.3 (H) 1.2 - 2.2   Bilirubin Total 0.3 0.0 - 1.2 mg/dL   Alkaline Phosphatase 51 39 - 117 IU/L   AST 31 0 - 40 IU/L   ALT 39 0 - 44 IU/L  Lipid Panel w/o Chol/HDL Ratio  Result Value Ref Range   Cholesterol, Total 202 (H) 100 - 199 mg/dL   Triglycerides 373 (H) 0 - 149 mg/dL   HDL 36 (L) >39 mg/dL   VLDL Cholesterol Cal 75 (H) 5 - 40 mg/dL   LDL Calculated 91 0 - 99 mg/dL      Assessment & Plan:   Problem List Items Addressed This Visit      Cardiovascular and Mediastinum   Essential hypertension, benign -  Primary    BPs stable and WNL, continue current regimen. Lifestyle modifications reviewed      Relevant Medications   fenofibrate 160 MG tablet   atorvastatin (LIPITOR) 40 MG tablet   Other Relevant Orders   Comprehensive metabolic panel (Completed)     Other   Hyperlipidemia    Stable, recheck lipids. 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 Will add hydroxyzine prn to help combat his anxiousness      Relevant Medications   hydrOXYzine (ATARAX/VISTARIL) 25 MG tablet   DULoxetine (CYMBALTA) 30 MG capsule       Follow up plan: Return in about 6 months (around 04/04/2019) for CPE.

## 2018-10-04 ENCOUNTER — Encounter: Payer: Self-pay | Admitting: Family Medicine

## 2018-10-04 LAB — COMPREHENSIVE METABOLIC PANEL
ALT: 39 IU/L (ref 0–44)
AST: 31 IU/L (ref 0–40)
Albumin/Globulin Ratio: 2.3 — ABNORMAL HIGH (ref 1.2–2.2)
Albumin: 4.5 g/dL (ref 3.5–5.5)
Alkaline Phosphatase: 51 IU/L (ref 39–117)
BUN/Creatinine Ratio: 9 (ref 9–20)
BUN: 9 mg/dL (ref 6–24)
Bilirubin Total: 0.3 mg/dL (ref 0.0–1.2)
CHLORIDE: 101 mmol/L (ref 96–106)
CO2: 23 mmol/L (ref 20–29)
Calcium: 9.7 mg/dL (ref 8.7–10.2)
Creatinine, Ser: 0.99 mg/dL (ref 0.76–1.27)
GFR calc Af Amer: 101 mL/min/{1.73_m2} (ref >59)
GFR calc non Af Amer: 87 mL/min/{1.73_m2} (ref >59)
GLOBULIN, TOTAL: 2 g/dL (ref 1.5–4.5)
Glucose: 128 mg/dL — ABNORMAL HIGH (ref 65–99)
Potassium: 4 mmol/L (ref 3.5–5.2)
Sodium: 140 mmol/L (ref 134–144)
Total Protein: 6.5 g/dL (ref 6.0–8.5)

## 2018-10-04 LAB — LIPID PANEL W/O CHOL/HDL RATIO
CHOLESTEROL TOTAL: 202 mg/dL — AB (ref 100–199)
HDL: 36 mg/dL — ABNORMAL LOW (ref >39)
LDL Calculated: 91 mg/dL (ref 0–99)
Triglycerides: 373 mg/dL — ABNORMAL HIGH (ref 0–149)
VLDL Cholesterol Cal: 75 mg/dL — ABNORMAL HIGH (ref 5–40)

## 2018-10-07 NOTE — Assessment & Plan Note (Signed)
BPs stable and WNL, continue current regimen. Lifestyle modifications reviewed

## 2018-10-07 NOTE — Assessment & Plan Note (Signed)
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 Will add hydroxyzine prn to help combat his anxiousness

## 2018-10-07 NOTE — Assessment & Plan Note (Signed)
Stable, recheck lipids. Continue current regimen and work on lifestyle modifications

## 2018-10-11 ENCOUNTER — Encounter: Payer: Self-pay | Admitting: Nurse Practitioner

## 2018-10-11 ENCOUNTER — Ambulatory Visit: Payer: 59 | Attending: Nurse Practitioner | Admitting: Nurse Practitioner

## 2018-10-11 VITALS — BP 132/83 | HR 64 | Temp 98.0°F | Resp 16 | Ht 67.0 in | Wt 185.0 lb

## 2018-10-11 DIAGNOSIS — M62838 Other muscle spasm: Secondary | ICD-10-CM | POA: Diagnosis not present

## 2018-10-11 DIAGNOSIS — M50321 Other cervical disc degeneration at C4-C5 level: Secondary | ICD-10-CM | POA: Diagnosis not present

## 2018-10-11 DIAGNOSIS — M25512 Pain in left shoulder: Secondary | ICD-10-CM | POA: Diagnosis not present

## 2018-10-11 DIAGNOSIS — M542 Cervicalgia: Secondary | ICD-10-CM

## 2018-10-11 DIAGNOSIS — I10 Essential (primary) hypertension: Secondary | ICD-10-CM | POA: Diagnosis not present

## 2018-10-11 DIAGNOSIS — M50323 Other cervical disc degeneration at C6-C7 level: Secondary | ICD-10-CM | POA: Insufficient documentation

## 2018-10-11 DIAGNOSIS — M47816 Spondylosis without myelopathy or radiculopathy, lumbar region: Secondary | ICD-10-CM | POA: Diagnosis not present

## 2018-10-11 DIAGNOSIS — M533 Sacrococcygeal disorders, not elsewhere classified: Secondary | ICD-10-CM | POA: Insufficient documentation

## 2018-10-11 DIAGNOSIS — E559 Vitamin D deficiency, unspecified: Secondary | ICD-10-CM | POA: Insufficient documentation

## 2018-10-11 DIAGNOSIS — G894 Chronic pain syndrome: Secondary | ICD-10-CM | POA: Diagnosis not present

## 2018-10-11 DIAGNOSIS — Z79891 Long term (current) use of opiate analgesic: Secondary | ICD-10-CM | POA: Insufficient documentation

## 2018-10-11 DIAGNOSIS — M5030 Other cervical disc degeneration, unspecified cervical region: Secondary | ICD-10-CM

## 2018-10-11 DIAGNOSIS — M47896 Other spondylosis, lumbar region: Secondary | ICD-10-CM | POA: Insufficient documentation

## 2018-10-11 DIAGNOSIS — M47812 Spondylosis without myelopathy or radiculopathy, cervical region: Secondary | ICD-10-CM | POA: Diagnosis not present

## 2018-10-11 DIAGNOSIS — E785 Hyperlipidemia, unspecified: Secondary | ICD-10-CM | POA: Diagnosis not present

## 2018-10-11 DIAGNOSIS — M4802 Spinal stenosis, cervical region: Secondary | ICD-10-CM | POA: Diagnosis not present

## 2018-10-11 DIAGNOSIS — M25511 Pain in right shoulder: Secondary | ICD-10-CM | POA: Diagnosis not present

## 2018-10-11 DIAGNOSIS — G8929 Other chronic pain: Secondary | ICD-10-CM

## 2018-10-11 DIAGNOSIS — M50322 Other cervical disc degeneration at C5-C6 level: Secondary | ICD-10-CM | POA: Diagnosis not present

## 2018-10-11 DIAGNOSIS — M25562 Pain in left knee: Secondary | ICD-10-CM | POA: Insufficient documentation

## 2018-10-11 DIAGNOSIS — Z79899 Other long term (current) drug therapy: Secondary | ICD-10-CM | POA: Diagnosis not present

## 2018-10-11 DIAGNOSIS — M47892 Other spondylosis, cervical region: Secondary | ICD-10-CM | POA: Diagnosis not present

## 2018-10-11 DIAGNOSIS — M5136 Other intervertebral disc degeneration, lumbar region: Secondary | ICD-10-CM | POA: Diagnosis not present

## 2018-10-11 DIAGNOSIS — M5412 Radiculopathy, cervical region: Secondary | ICD-10-CM | POA: Diagnosis not present

## 2018-10-11 MED ORDER — BACLOFEN 10 MG PO TABS: 10.0000 mg | ORAL_TABLET | Freq: Every day | ORAL | 0 refills | Status: DC

## 2018-10-11 MED ORDER — HYDROCODONE-ACETAMINOPHEN 5-325 MG PO TABS: 1.0000 | ORAL_TABLET | Freq: Every day | ORAL | 0 refills | Status: DC | PRN

## 2018-10-11 NOTE — Progress Notes (Signed)
Patient's Name: Jose Washington  MRN: [id number]  Referring Provider: Kathrine Haddock, NP  DOB: 26-Oct-1966  PCP: Volney American, PA-C  DOS: 10/11/2018  Note by: Vevelyn Francois NP  Service setting: Ambulatory outpatient  Specialty: Interventional Pain Management  Location: [facility] (AMB) Pain Management Facility    Patient type: Established    Primary Reason(s) for Visit: Encounter for prescription drug management & post-procedure evaluation of chronic illness with mild to moderate exacerbation(Level of risk: moderate) CC: Neck Pain (left) and Back Pain (lower left and right )  HPI  Jose Washington is a 52 y.o. year old, male patient, who comes today for a post-procedure evaluation and medication management. He has Chronic neck pain (Primary Source of Pain) (Bilateral) (L>R); Hyperlipidemia; Essential hypertension, benign; Gastritis; Chronic pain syndrome; Chronic low back pain (Secondary source of pain) (Bilateral) (L>R); Chronic upper back pain [facility] source of pain) (Bilateral) (L>R); Chronic shoulder pain (Bilateral) (L>R); Osteoarthritis of AC (acromioclavicular) joint (Right); Chronic sacroiliac joint pain (Bilateral) (L>R); Long term current use of opiate analgesic; Long term prescription opiate use; Opiate use (5 MME/Day); Muscle spasticity; Cervical DDD (C4-5, C5-6, C6-7 and C7-T1); Cervical foraminal stenosis (Bilateral: C5-6 & C6-7, Left: C4-5 & C7-T1); Cervical radiculitis (Bilateral) (L>R); Vitamin D insufficiency; Cervical facet syndrome (HCC) (L); Cervical spondylosis; Musculoskeletal neck pain (trapezius) (Left); Major depression in remission ([place]); Chronic knee pain (Left); Chronic lower extremity pain (Left); Grade 1 Retrolisthesis of L3 over L4; Lumbar facet arthropathy (Bilateral); Lumbar facet osteoarthritis; Lumbar facet syndrome (Bilateral) (L>R); Lumbar spondylosis; Chronic right shoulder pain; Suprascapular neuropathy, right; Depression, major, single episode, complete  remission ([place]); DDD (degenerative disc disease), lumbar; and Lumbar facet hypertrophy (Multilevel) (Bilateral) on their problem list. His primarily concern today is the Neck Pain (left) and Back Pain (lower left and right )  Pain Assessment: Location: Left(bilateal ) Neck(back ) Radiating: denies Onset: More than a month ago Duration: Chronic pain Quality: Aching, Discomfort, Sharp Severity: 1 /10 (subjective, self-reported pain score)  Note: Reported level is compatible with observation.                          Effect on ADL: back and neck gets very tired after working.  Timing: Constant(neck is constant and back is intermittent ) Modifying factors: medications, TENS unit on the neck.  salon pas, stretching from PT BP: 132/83  HR: 64  Jose Washington was last seen on 07/26/2018 for a procedure. During today's appointment we reviewed Jose Washington post-procedure results, as well as his outpatient medication regimen. He feels like his neck pain is worse than his back pain. He will treat the neck and later his back. He does continue to work full time. He uses a small amount of Narcotics. He controls his pain with several different treatment methods.  Further details on both, my assessment(s), as well as the proposed treatment plan, please see below.  Controlled Substance Pharmacotherapy Assessment REMS (Risk Evaluation and Mitigation Strategy)  Analgesic:Hydrocodone/APAP 5/325 one daily ME/day:7.37m/day PJanett Billow RN  10/11/2018 11:10 AM  Sign when Signing Visit Nursing Pain Medication Assessment:  Safety precautions to be maintained throughout the outpatient stay will include: orient to surroundings, keep bed in low position, maintain call bell within reach at all times, provide assistance with transfer out of bed and ambulation.  Medication Inspection Compliance: Pill count conducted under aseptic conditions, in front of the patient. Neither the pills nor the bottle was removed  from the patient's sight  at any time. Once count was completed pills were immediately returned to the patient in their original bottle.  Medication: Hydrocodone/APAP Pill/Patch Count: 13 of 15 pills remain Pill/Patch Appearance: Markings consistent with prescribed medication Bottle Appearance: Standard pharmacy container. Clearly labeled. Filled Date: 42 / 08 / 2019 Last Medication intake:  Day before yesterday   Pharmacokinetics: Liberation and absorption (onset of action): WNL Distribution (time to peak effect): WNL Metabolism and excretion (duration of action): WNL         Pharmacodynamics: Desired effects: Analgesia: Jose Washington reports >50% benefit. Functional ability: Patient reports that medication allows him to accomplish basic ADLs Clinically meaningful improvement in function (CMIF): Sustained CMIF goals met Perceived effectiveness: Described as relatively effective, allowing for increase in activities of daily living (ADL) Undesirable effects: Side-effects or Adverse reactions: None reported Monitoring: [place] PMP: Online review of the past 46-monthperiod conducted. Compliant with practice rules and regulations Last UDS on record: Summary  Date Value Ref Range Status  07/13/2018 FINAL  Final    Comment:    ==================================================================== TOXASSURE SELECT 13 (MW) ==================================================================== Test                             Result       Flag       Units Drug Present and Declared for Prescription Verification   Hydrocodone                    29           EXPECTED   ng/mg creat   Norhydrocodone                 65           EXPECTED   ng/mg creat    Sources of hydrocodone include scheduled prescription    medications. Norhydrocodone is an expected metabolite of    hydrocodone. ==================================================================== Test                      Result    Flag   Units       Ref Range   Creatinine              309              mg/dL      >=20 ==================================================================== Declared Medications:  The flagging and interpretation on this report are based on the  following declared medications.  Unexpected results may arise from  inaccuracies in the declared medications.  **Note: The testing scope of this panel includes these medications:  Hydrocodone (Hydrocodone-Acetaminophen)  **Note: The testing scope of this panel does not include following  reported medications:  Acetaminophen (Hydrocodone-Acetaminophen)  Acetaminophen (Tylenol)  Atenolol (Tenormin)  Atorvastatin (Lipitor)  Baclofen (Lioresal)  Buspirone (BuSpar)  Chondroitin  Chondroitin (Glucosamine-Chondroitin)  Cyanocobalamin  Desloratadine (Clarinex-D)  Duloxetine (Cymbalta)  Fenofibrate  Fluticasone (Flonase)  Glucosamine (Glucosamine-Chondroitin)  Ibuprofen  Magnesium Oxide  Melatonin  Methylsulfonylmethane  Multivitamin (MVI)  Omeprazole (Prilosec)  Ondansetron (Zofran)  Potassium  Pseudoephedrine (Clarinex-D)  Vitamin D3 ==================================================================== For clinical consultation, please call [phone] ====================================================================    UDS interpretation: Compliant          Medication Assessment Form: Reviewed. Patient indicates being compliant with therapy Treatment compliance: Compliant Risk Assessment Profile: Aberrant behavior: See prior evaluations. None observed or detected today Comorbid factors increasing risk of overdose: See prior notes. No additional risks detected today Opioid  risk tool (ORT) (Total Score):   Personal History of Substance Abuse (SUD-Substance use disorder):  Alcohol:    Illegal Drugs:    Rx Drugs:    ORT Risk Level calculation:   Risk of substance use disorder (SUD): Low  ORT Scoring interpretation table:  Score <3 = Low Risk  for SUD  Score between 4-7 = Moderate Risk for SUD  Score >8 = High Risk for Opioid Abuse   Risk Mitigation Strategies:  Patient Counseling: Covered Patient-Prescriber Agreement (PPA): Present and active  Notification to other healthcare providers: Done  Pharmacologic Plan: No change in therapy, at this time.             Post-Procedure Assessment  07/25/2018 Procedure: Left-sided lumbar epidural steroid injection Pre-procedure pain score:  2/10 Post-procedure pain score: 0/10         Influential Factors: BMI: 28.98 kg/m Intra-procedural challenges: None observed.         Assessment challenges: None detected.              Reported side-effects: None.        Post-procedural adverse reactions or complications: None reported         Sedation: Please see nurses note. When no sedatives are used, the analgesic levels obtained are directly associated to the effectiveness of the local anesthetics. However, when sedation is provided, the level of analgesia obtained during the initial 1 hour following the intervention, is believed to be the result of a combination of factors. These factors may include, but are not limited to: 1. The effectiveness of the local anesthetics used. 2. The effects of the analgesic(s) and/or anxiolytic(s) used. 3. The degree of discomfort experienced by the patient at the time of the procedure. 4. The patients ability and reliability in recalling and recording the events. 5. The presence and influence of possible secondary gains and/or psychosocial factors. Reported result: Relief experienced during the 1st hour after the procedure: 50 % (Ultra-Short Term Relief)            Interpretative annotation: Clinically appropriate result. Analgesia during this period is likely to be Local Anesthetic and/or IV Sedative (Analgesic/Anxiolytic) related.          Effects of local anesthetic: The analgesic effects attained during this period are directly associated to the localized  infiltration of local anesthetics and therefore cary significant diagnostic value as to the etiological location, or anatomical origin, of the pain. Expected duration of relief is directly dependent on the pharmacodynamics of the local anesthetic used. Long-acting (4-6 hours) anesthetics used.  Reported result: Relief during the next 4 to 6 hour after the procedure: 50 % (Short-Term Relief)            Interpretative annotation: Clinically appropriate result. Analgesia during this period is likely to be Local Anesthetic-related.          Long-term benefit: Defined as the period of time past the expected duration of local anesthetics (1 hour for short-acting and 4-6 hours for long-acting). With the possible exception of prolonged sympathetic blockade from the local anesthetics, benefits during this period are typically attributed to, or associated with, other factors such as analgesic sensory neuropraxia, antiinflammatory effects, or beneficial biochemical changes provided by agents other than the local anesthetics.  Reported result: Extended relief following procedure: 30 % (Long-Term Relief)            Interpretative annotation: Clinically possible results. Good relief. No permanent benefit expected. Inflammation plays a part in the etiology to  the pain.          Current benefits: Defined as reported results that persistent at this point in time.   Analgesia: 50 %            Function: Somewhat improved ROM: Somewhat improved Interpretative annotation: Recurrence of symptoms.    Effective diagnostic intervention.          Interpretation: Results would suggest this therapy to be effective in the management of Mr. Rock condition.                  Plan:  Please see "Plan of Care" for details.                Laboratory Chemistry  Inflammation Markers (CRP: Acute Phase) (ESR: Chronic Phase) Lab Results  Component Value Date   CRP 1.4 (H) 11/12/2016   ESRSEDRATE 3 11/12/2016                          Rheumatology Markers Lab Results  Component Value Date   LABURIC 5.6 07/30/2015                        Renal Function Markers Lab Results  Component Value Date   BUN 9 10/03/2018   CREATININE 0.99 10/03/2018   BCR 9 10/03/2018   GFRAA 101 10/03/2018   GFRNONAA 87 10/03/2018                             Hepatic Function Markers Lab Results  Component Value Date   AST 31 10/03/2018   ALT 39 10/03/2018   ALBUMIN 4.5 10/03/2018   ALKPHOS 51 10/03/2018                        Electrolytes Lab Results  Component Value Date   NA 140 10/03/2018   K 4.0 10/03/2018   CL 101 10/03/2018   CALCIUM 9.7 10/03/2018   MG 2.0 11/12/2016                        Neuropathy Markers Lab Results  Component Value Date   VITAMINB12 820 11/12/2016   HIV Non Reactive 04/10/2018                        CNS Tests No results found for: COLORCSF, APPEARCSF, RBCCOUNTCSF, WBCCSF, POLYSCSF, LYMPHSCSF, EOSCSF, PROTEINCSF, GLUCCSF, JCVIRUS, CSFOLI, IGGCSF                      Bone Pathology Markers Lab Results  Component Value Date   25OHVITD1 23 (L) 11/12/2016   25OHVITD2 <1.0 11/12/2016   25OHVITD3 23 11/12/2016                         Coagulation Parameters Lab Results  Component Value Date   PLT 346 04/10/2018                        Cardiovascular Markers Lab Results  Component Value Date   HGB 15.0 04/10/2018   HCT 44.5 04/10/2018                         CA Markers No results found for: CEA, CA125, LABCA2  Note: Lab results reviewed.  Recent Diagnostic Imaging Results  DG C-Arm 1-60 Min-No Report Fluoroscopy was utilized by the requesting physician.  No radiographic  interpretation.   Complexity Note: Imaging results reviewed. Results shared with Mr. Willert, using Layman's terms.                         Meds   Current Outpatient Medications:  .  acetaminophen (TYLENOL) 500 MG tablet, Take 500 mg by mouth every 6 (six) hours as needed., Disp:  , Rfl:  .  atenolol (TENORMIN) 50 MG tablet, TAKE 1 TABLET BY MOUTH EVERY DAY, Disp: 90 tablet, Rfl: 1 .  atorvastatin (LIPITOR) 40 MG tablet, Take 1 tablet (40 mg total) by mouth daily., Disp: 90 tablet, Rfl: 1 .  [START ON 10/29/2018] baclofen (LIORESAL) 10 MG tablet, Take 1 tablet (10 mg total) by mouth at bedtime., Disp: 90 tablet, Rfl: 0 .  busPIRone (BUSPAR) 30 MG tablet, TAKE 1 TABLET (30 MG TOTAL) BY MOUTH 2 (TWO) TIMES DAILY., Disp: 180 tablet, Rfl: 1 .  Calcium Carbonate (CALCIUM 600 PO), Take 600 mg by mouth daily., Disp: , Rfl:  .  Cholecalciferol (VITAMIN D3) 1000 units CAPS, Take 1 capsule by mouth daily., Disp: , Rfl:  .  Chondroitin Sulfate 400 MG CAPS, Take by mouth 2 (two) times daily., Disp: , Rfl:  .  desloratadine-pseudoephedrine (CLARINEX-D 12-HOUR) 2.5-120 MG 12 hr tablet, Take 1 tablet by mouth as needed., Disp: , Rfl:  .  DiphenhydrAMINE HCl (DIPHEDRYL ALLERGY PO), Take 25 mg by mouth daily., Disp: , Rfl:  .  DULoxetine (CYMBALTA) 30 MG capsule, Take 1 capsule (30 mg total) by mouth 2 (two) times daily., Disp: 180 capsule, Rfl: 1 .  fenofibrate 160 MG tablet, Take 1 tablet (160 mg total) by mouth daily., Disp: 90 tablet, Rfl: 1 .  fluticasone (FLONASE) 50 MCG/ACT nasal spray, Place 2 sprays into both nostrils 2 (two) times daily., Disp: , Rfl:  .  Glucosamine-Chondroit-Vit C-Mn (GLUCOSAMINE 1500 COMPLEX PO), Take by mouth 2 (two) times daily., Disp: , Rfl:  .  [START ON 12/28/2018] HYDROcodone-acetaminophen (NORCO/VICODIN) 5-325 MG tablet, Take 1 tablet by mouth daily as needed for moderate pain., Disp: 15 tablet, Rfl: 0 .  hydrOXYzine (ATARAX/VISTARIL) 25 MG tablet, Take 1 tablet (25 mg total) by mouth 3 (three) times daily as needed., Disp: 90 tablet, Rfl: 1 .  ibuprofen (ADVIL,MOTRIN) 200 MG tablet, Take 200 mg by mouth 3 (three) times a week., Disp: , Rfl:  .  magnesium oxide (MAG-OX) 400 MG tablet, Take 400 mg by mouth daily., Disp: , Rfl:  .  Melatonin 5 MG TABS, Take  by mouth at bedtime. , Disp: , Rfl:  .  Methylsulfonylmethane (MSM PO), Take by mouth 2 (two) times daily., Disp: , Rfl:  .  Multiple Vitamin (MULTIVITAMIN) tablet, Take 1 tablet by mouth daily., Disp: , Rfl:  .  omeprazole (PRILOSEC) 20 MG capsule, TAKE 1 CAPSULE BY MOUTH EVERY DAY, Disp: 90 capsule, Rfl: 0 .  ondansetron (ZOFRAN) 8 MG tablet, Take 1 tablet (8 mg total) by mouth 2 (two) times daily., Disp: 180 tablet, Rfl: 3 .  Potassium 99 MG TABS, Take 1 tablet by mouth., Disp: , Rfl:  .  vitamin B-12 (CYANOCOBALAMIN) 1000 MCG tablet, Take 1,000 mcg by mouth daily., Disp: , Rfl:  .  [START ON 11/28/2018] HYDROcodone-acetaminophen (NORCO/VICODIN) 5-325 MG tablet, Take 1 tablet by mouth daily as needed for moderate pain., Disp: 15  tablet, Rfl: 0 .  [START ON 10/29/2018] HYDROcodone-acetaminophen (NORCO/VICODIN) 5-325 MG tablet, Take 1 tablet by mouth daily as needed for moderate pain., Disp: 15 tablet, Rfl: 0  ROS  Constitutional: Denies any fever or chills Gastrointestinal: No reported hemesis, hematochezia, vomiting, or acute GI distress Musculoskeletal: Denies any acute onset joint swelling, redness, loss of ROM, or weakness Neurological: No reported episodes of acute onset apraxia, aphasia, dysarthria, agnosia, amnesia, paralysis, loss of coordination, or loss of consciousness  Allergies  Mr. Ruesch has No Known Allergies.  [place]  Drug: Mr. Saraceno  reports no history of drug use. Alcohol:  reports no history of alcohol use. Tobacco:  reports that he has never smoked. He has never used smokeless tobacco. Medical:  has a past medical history of Allergy, Anxiety, Chronic duodenal ulcer with hemorrhage (2012), Chronic neck pain, Depression, Hyperlipidemia, Hypertension, and Microscopic hematuria. Surgical: Mr. Goyer  has a past surgical history that includes eye muscle repair [phone] and 1975) and bLEEDING ULCER (2012). Family: family history includes Allergies in his brother; Cancer in his  maternal grandfather and mother; Dementia in his father and paternal grandfather; Depression in his father; Diabetes in his mother; Mental illness in his father; Stroke in his paternal grandfather.  Constitutional Exam  General appearance: Well nourished, well developed, and well hydrated. In no apparent acute distress Vitals:   10/11/18 1053  BP: 132/83  Pulse: 64  Resp: 16  Temp: 98 F (36.7 C)  TempSrc: Oral  SpO2: 100%  Weight: 185 lb (83.9 kg)  Height: '5\' 7"'  (1.702 m)   Psych/Mental status: Alert, oriented x 3 (person, place, & time)       Eyes: PERLA Respiratory: No evidence of acute respiratory distress  Cervical Spine Area Exam  Skin & Axial Inspection: No masses, redness, edema, swelling, or associated skin lesions Alignment: Symmetrical Functional ROM: Unrestricted ROM      Stability: No instability detected Muscle Tone/Strength: Functionally intact. No obvious neuro-muscular anomalies detected. Sensory (Neurological): Unimpaired Palpation: Tender              Upper Extremity (UE) Exam    Side: Right upper extremity  Side: Left upper extremity  Skin & Extremity Inspection: Skin color, temperature, and hair growth are WNL. No peripheral edema or cyanosis. No masses, redness, swelling, asymmetry, or associated skin lesions. No contractures.  Skin & Extremity Inspection: Skin color, temperature, and hair growth are WNL. No peripheral edema or cyanosis. No masses, redness, swelling, asymmetry, or associated skin lesions. No contractures.  Functional ROM: Unrestricted ROM          Functional ROM: Unrestricted ROM          Muscle Tone/Strength: Functionally intact. No obvious neuro-muscular anomalies detected.  Muscle Tone/Strength: Functionally intact. No obvious neuro-muscular anomalies detected.  Sensory (Neurological): Unimpaired          Sensory (Neurological): Unimpaired          Palpation: No palpable anomalies              Palpation: No palpable anomalies                    Thoracic Spine Area Exam  Skin & Axial Inspection: No masses, redness, or swelling Alignment: Symmetrical Functional ROM: Unrestricted ROM Stability: No instability detected Muscle Tone/Strength: Functionally intact. No obvious neuro-muscular anomalies detected. Sensory (Neurological): Unimpaired Muscle strength & Tone: No palpable anomalies  Lumbar Spine Area Exam  Skin & Axial Inspection: No masses, redness, or swelling Alignment:  Symmetrical Functional ROM: Unrestricted ROM       Stability: No instability detected Muscle Tone/Strength: Functionally intact. No obvious neuro-muscular anomalies detected. Sensory (Neurological): Unimpaired Palpation: Tender         Gait & Posture Assessment  Ambulation: Unassisted Gait: Relatively normal for age and body habitus Posture: WNL   Lower Extremity Exam    Side: Right lower extremity  Side: Left lower extremity  Stability: No instability observed          Stability: No instability observed          Skin & Extremity Inspection: Skin color, temperature, and hair growth are WNL. No peripheral edema or cyanosis. No masses, redness, swelling, asymmetry, or associated skin lesions. No contractures.  Skin & Extremity Inspection: Skin color, temperature, and hair growth are WNL. No peripheral edema or cyanosis. No masses, redness, swelling, asymmetry, or associated skin lesions. No contractures.  Functional ROM: Unrestricted ROM                  Functional ROM: Unrestricted ROM                  Muscle Tone/Strength: Functionally intact. No obvious neuro-muscular anomalies detected.  Muscle Tone/Strength: Functionally intact. No obvious neuro-muscular anomalies detected.  Sensory (Neurological): Unimpaired        Sensory (Neurological): Unimpaired            Palpation: No palpable anomalies  Palpation: No palpable anomalies   Assessment  Primary Diagnosis & Pertinent Problem List: The primary encounter diagnosis was Cervical spondylosis.  Diagnoses of Lumbar spondylosis, Chronic pain syndrome, Muscle spasticity, Cervical radiculitis (Bilateral) (L>R), Cervical foraminal stenosis (Bilateral: C5-6 & C6-7, Left: C4-5 & C7-T1), Cervical DDD (C4-5, C5-6, C6-7 and C7-T1), and Chronic neck pain (Primary Source of Pain) (Bilateral) (L>R) were also pertinent to this visit.  Status Diagnosis  Persistent Persistent Controlled 1. Cervical spondylosis   2. Lumbar spondylosis   3. Chronic pain syndrome   4. Muscle spasticity   5. Cervical radiculitis (Bilateral) (L>R)   6. Cervical foraminal stenosis (Bilateral: C5-6 & C6-7, Left: C4-5 & C7-T1)   7. Cervical DDD (C4-5, C5-6, C6-7 and C7-T1)   8. Chronic neck pain (Primary Source of Pain) (Bilateral) (L>R)     Problems updated and reviewed during this visit: No problems updated. Plan of Care  Pharmacotherapy (Medications Ordered): Meds ordered this encounter  Medications  . HYDROcodone-acetaminophen (NORCO/VICODIN) 5-325 MG tablet    Sig: Take 1 tablet by mouth daily as needed for moderate pain.    Dispense:  15 tablet    Refill:  0    Do not place this medication, or any other prescription from our practice, on "Automatic Refill". Patient may have prescription filled one day early if pharmacy is closed on scheduled refill date.    Order Specific Question:   Supervising Provider    Answer:   Milinda Pointer [phone]  . HYDROcodone-acetaminophen (NORCO/VICODIN) 5-325 MG tablet    Sig: Take 1 tablet by mouth daily as needed for moderate pain.    Dispense:  15 tablet    Refill:  0    Do not place this medication, or any other prescription from our practice, on "Automatic Refill". Patient may have prescription filled one day early if pharmacy is closed on scheduled refill date.    Order Specific Question:   Supervising Provider    Answer:   Milinda Pointer [phone]  . baclofen (LIORESAL) 10 MG tablet    Sig: Take  1 tablet (10 mg total) by mouth at bedtime.    Dispense:  90  tablet    Refill:  0    Do not place medication on "Automatic Refill". Fill one day early if pharmacy is closed on scheduled refill date.    Order Specific Question:   Supervising Provider    Answer:   Milinda Pointer [phone]  . HYDROcodone-acetaminophen (NORCO/VICODIN) 5-325 MG tablet    Sig: Take 1 tablet by mouth daily as needed for moderate pain.    Dispense:  15 tablet    Refill:  0    Do not place this medication, or any other prescription from our practice, on "Automatic Refill". Patient may have prescription filled one day early if pharmacy is closed on scheduled refill date.    Order Specific Question:   Supervising Provider    Answer:   Milinda Pointer [[id number]]   New Prescriptions   No medications on file   Medications administered today: Sallee Lange. Cinquemani had no medications administered during this visit. Lab-work, procedure(s), and/or referral(s): No orders of the defined types were placed in this encounter.  Imaging and/or referral(s): None  Interventional management options: Planned, scheduled, and/or pending: Not at this time   Considering: Diagnostic bilateral lumbar facetblock  Possible bilateral lumbar facet RFA Palliative left-sided cervical epidural steroid injection#2 Diagnostic bilateral cervical facetblock  Possible bilateral cervical facet RFA Diagnostic bilateral sacroiliac joint block Possible bilateral sacroiliac joint RFA Diagnostic trigger point injections   Palliative PRN treatment(s): Diagnostic left L4-5 interlaminar lumbar epidural steroid injection #2 Palliative left-sided cervical epidural steroid injection#2    Provider-requested follow-up: Return in about 3 months (around 01/10/2019) for MedMgmt.  Future Appointments  Date Time Provider [place]  01/16/2019 10:30 AM Vevelyn Francois, NP [facility] None  04/17/2019 10:00 AM Volney American, PA-C [facility]   Primary Care Physician: Nyra Capes Location: [facility] Note by: Vevelyn Francois NP Date: 10/11/2018; Time: 10:02 PM  Pain Score Disclaimer: We use the NRS-11 scale. This is a self-reported, subjective measurement of pain severity with only modest accuracy. It is used primarily to identify changes within a particular patient. It must be understood that outpatient pain scales are significantly less accurate that those used for research, where they can be applied under ideal controlled circumstances with minimal exposure to variables. In reality, the score is likely to be a combination of pain intensity and pain affect, where pain affect describes the degree of emotional arousal or changes in action readiness caused by the sensory experience of pain. Factors such as social and work situation, setting, emotional state, anxiety levels, expectation, and prior pain experience may influence pain perception and show large inter-individual differences that may also be affected by time variables.  Patient instructions provided during this appointment: Patient Instructions  ____________________________________________________________________________________________  Medication Rules  Purpose: To inform patients, and their family members, of our rules and regulations.  Applies to: All patients receiving prescriptions (written or electronic).  Pharmacy of record: Pharmacy where electronic prescriptions will be sent. If written prescriptions are taken to a different pharmacy, please inform the nursing staff. The pharmacy listed in the electronic medical record should be the one where you would like electronic prescriptions to be sent.  Electronic prescriptions: In compliance with the [place] (STOP) Act of 2017 (Session Lanny Cramp [phone]), effective November 01, 2018, all controlled substances must be electronically prescribed. Calling prescriptions to  the pharmacy will cease  to exist.  Prescription refills: Only during scheduled appointments. Applies to all prescriptions.  NOTE: The following applies primarily to controlled substances (Opioid* Pain Medications).   Patient's responsibilities: 1. Pain Pills: Bring all pain pills to every appointment (except for procedure appointments). 2. Pill Bottles: Bring pills in original pharmacy bottle. Always bring the newest bottle. Bring bottle, even if empty. 3. Medication refills: You are responsible for knowing and keeping track of what medications you take and those you need refilled. The day before your appointment: write a list of all prescriptions that need to be refilled. The day of the appointment: give the list to the admitting nurse. Prescriptions will be written only during appointments. If you forget a medication: it will not be "Called in", "Faxed", or "electronically sent". You will need to get another appointment to get these prescribed. No early refills. Do not call asking to have your prescription filled early. 4. Prescription Accuracy: You are responsible for carefully inspecting your prescriptions before leaving our office. Have the discharge nurse carefully go over each prescription with you, before taking them home. Make sure that your name is accurately spelled, that your address is correct. Check the name and dose of your medication to make sure it is accurate. Check the number of pills, and the written instructions to make sure they are clear and accurate. Make sure that you are given enough medication to last until your next medication refill appointment. 5. Taking Medication: Take medication as prescribed. When it comes to controlled substances, taking less pills or less frequently than prescribed is permitted and encouraged. Never take more pills than instructed. Never take medication more frequently than prescribed.  6. Inform other Doctors: Always inform, all of your  healthcare providers, of all the medications you take. 7. Pain Medication from other Providers: You are not allowed to accept any additional pain medication from any other Doctor or Healthcare provider. There are two exceptions to this rule. (see below) In the event that you require additional pain medication, you are responsible for notifying us, as stated below. 8. Medication Agreement: You are responsible for carefully reading and following our Medication Agreement. This must be signed before receiving any prescriptions from our practice. Safely store a copy of your signed Agreement. Violations to the Agreement will result in no further prescriptions. (Additional copies of our Medication Agreement are available upon request.) 9. Laws, Rules, & Regulations: All patients are expected to follow all Federal and [employer], Rules, [employer]. Ignorance of the Laws does not constitute a valid excuse. The use of any illegal substances is prohibited. 10. Adopted CDC guidelines & recommendations: Target dosing levels will be at or below 60 MME/day. Use of benzodiazepines** is not recommended.  Exceptions: There are only two exceptions to the rule of not receiving pain medications from other Healthcare Providers. 1. Exception #1 (Emergencies): In the event of an emergency (i.e.: accident requiring emergency care), you are allowed to receive additional pain medication. However, you are responsible for: As soon as you are able, call our office [phone], at any time of the day or night, and leave a message stating your name, the date and nature of the emergency, and the name and dose of the medication prescribed. In the event that your call is answered by a member of our staff, make sure to document and save the date, time, and the name of the person that took your information.  2. Exception #2 (Planned Surgery): In the event that you  are scheduled by another doctor or dentist to have any type of  surgery or procedure, you are allowed (for a period no longer than 30 days), to receive additional pain medication, for the acute post-op pain. However, in this case, you are responsible for picking up a copy of our "Post-op Pain Management for Surgeons" handout, and giving it to your surgeon or dentist. This document is available at our office, and does not require an appointment to obtain it. Simply go to our office during business hours (Monday-Thursday from 8:00 AM to 4:00 PM) (Friday 8:00 AM to 12:00 Noon) or if you have a scheduled appointment with [place], prior to your surgery, and ask for it by name. In addition, you will need to provide [place] with your name, name of your surgeon, type of surgery, and date of procedure or surgery.  *Opioid medications include: morphine, codeine, oxycodone, oxymorphone, hydrocodone, hydromorphone, meperidine, tramadol, tapentadol, buprenorphine, fentanyl, methadone. **Benzodiazepine medications include: diazepam (Valium), alprazolam (Xanax), clonazepam (Klonopine), lorazepam (Ativan), clorazepate (Tranxene), chlordiazepoxide (Librium), estazolam (Prosom), oxazepam (Serax), temazepam (Restoril), triazolam (Halcion) (Last updated: 12/29/2017) ____________________________________________________________________________________________   ____________________________________________________________________________________________  General Risks and Possible Complications  Patient Responsibilities: It is important that you read this as it is part of your informed consent. It is our duty to inform you of the risks and possible complications associated with treatments offered to you. It is your responsibility as a patient to read this and to ask questions about anything that is not clear or that you believe was not covered in this document.  Patient's Rights: You have the right to refuse treatment. You also have the right to change your mind, even after initially having agreed  to have the treatment done. However, under this last option, if you wait until the last second to change your mind, you may be charged for the materials used up to that point.  Introduction: Medicine is not an Chief Strategy Officer. Everything in Medicine, including the lack of treatment(s), carries the potential for danger, harm, or loss (which is by definition: Risk). In Medicine, a complication is a secondary problem, condition, or disease that can aggravate an already existing one. All treatments carry the risk of possible complications. The fact that a side effects or complications occurs, does not imply that the treatment was conducted incorrectly. It must be clearly understood that these can happen even when everything is done following the highest safety standards.  No treatment: You can choose not to proceed with the proposed treatment alternative. The "PRO(s)" would include: avoiding the risk of complications associated with the therapy. The "CON(s)" would include: not getting any of the treatment benefits. These benefits fall under one of three categories: diagnostic; therapeutic; and/or palliative. Diagnostic benefits include: getting information which can ultimately lead to improvement of the disease or symptom(s). Therapeutic benefits are those associated with the successful treatment of the disease. Finally, palliative benefits are those related to the decrease of the primary symptoms, without necessarily curing the condition (example: decreasing the pain from a flare-up of a chronic condition, such as incurable terminal cancer).  General Risks and Complications: These are associated to most interventional treatments. They can occur alone, or in combination. They fall under one of the following six (6) categories: no benefit or worsening of symptoms; bleeding; infection; nerve damage; allergic reactions; and/or death. 1. No benefits or worsening of symptoms: In Medicine there are no guarantees, only  probabilities. No healthcare provider can ever guarantee that a medical treatment will work, they can only  state the probability that it may. Furthermore, there is always the possibility that the condition may worsen, either directly, or indirectly, as a consequence of the treatment. 2. Bleeding: This is more common if the patient is taking a blood thinner, either prescription or over the counter (example: Goody Powders, Fish oil, Aspirin, Garlic, etc.), or if suffering a condition associated with impaired coagulation (example: Hemophilia, cirrhosis of the liver, low platelet counts, etc.). However, even if you do not have one on these, it can still happen. If you have any of these conditions, or take one of these drugs, make sure to notify your treating physician. 3. Infection: This is more common in patients with a compromised immune system, either due to disease (example: diabetes, cancer, human immunodeficiency virus [HIV], etc.), or due to medications or treatments (example: therapies used to treat cancer and rheumatological diseases). However, even if you do not have one on these, it can still happen. If you have any of these conditions, or take one of these drugs, make sure to notify your treating physician. 4. Nerve Damage: This is more common when the treatment is an invasive one, but it can also happen with the use of medications, such as those used in the treatment of cancer. The damage can occur to small secondary nerves, or to large primary ones, such as those in the spinal cord and brain. This damage may be temporary or permanent and it may lead to impairments that can range from temporary numbness to permanent paralysis and/or brain death. 5. Allergic Reactions: Any time a substance or material comes in contact with our body, there is the possibility of an allergic reaction. These can range from a mild skin rash (contact dermatitis) to a severe systemic reaction (anaphylactic reaction), which can  result in death. 6. Death: In general, any medical intervention can result in death, most of the time due to an unforeseen complication. ____________________________________________________________________________________________  Epidural Steroid Injection Patient Information  Description: The epidural space surrounds the nerves as they exit the spinal cord.  In some patients, the nerves can be compressed and inflamed by a bulging disc or a tight spinal canal (spinal stenosis).  By injecting steroids into the epidural space, we can bring irritated nerves into direct contact with a potentially helpful medication.  These steroids act directly on the irritated nerves and can reduce swelling and inflammation which often leads to decreased pain.  Epidural steroids may be injected anywhere along the spine and from the neck to the low back depending upon the location of your pain.   After numbing the skin with local anesthetic (like Novocaine), a small needle is passed into the epidural space slowly.  You may experience a sensation of pressure while this is being done.  The entire block usually last less than 10 minutes.  Conditions which may be treated by epidural steroids:   Low back and leg pain  Neck and arm pain  Spinal stenosis  Post-laminectomy syndrome  Herpes zoster (shingles) pain  Pain from compression fractures  Preparation for the injection:  1. Do not eat any solid food or dairy products within 8 hours of your appointment.  2. You may drink clear liquids up to 3 hours before appointment.  Clear liquids include water, black coffee, juice or soda.  No milk or cream please. 3. You may take your regular medication, including pain medications, with a sip of water before your appointment  Diabetics should hold regular insulin (if taken separately) and take 1/2  normal NPH dos the morning of the procedure.  Carry some sugar containing items with you to your appointment. 4. A driver must  accompany you and be prepared to drive you home after your procedure.  5. Bring all your current medications with your. 6. An IV may be inserted and sedation may be given at the discretion of the physician.   7. A blood pressure cuff, EKG and other monitors will often be applied during the procedure.  Some patients may need to have extra oxygen administered for a short period. 8. You will be asked to provide medical information, including your allergies, prior to the procedure.  We must know immediately if you are taking blood thinners (like Coumadin/Warfarin)  Or if you are allergic to IV iodine contrast (dye). We must know if you could possible be pregnant.  Possible side-effects:  Bleeding from needle site  Infection (rare, may require surgery)  Nerve injury (rare)  Numbness & tingling (temporary)  Difficulty urinating (rare, temporary)  Spinal headache ( a headache worse with upright posture)  Light -headedness (temporary)  Pain at injection site (several days)  Decreased blood pressure (temporary)  Weakness in arm/leg (temporary)  Pressure sensation in back/neck (temporary)  Call if you experience:  Fever/chills associated with headache or increased back/neck pain.  Headache worsened by an upright position.  New onset weakness or numbness of an extremity below the injection site  Hives or difficulty breathing (go to the emergency room)  Inflammation or drainage at the infection site  Severe back/neck pain  Any new symptoms which are concerning to you  Please note:  Although the local anesthetic injected can often make your back or neck feel good for several hours after the injection, the pain will likely return.  It takes 3-7 days for steroids to work in the epidural space.  You may not notice any pain relief for at least that one week.  If effective, we will often do a series of three injections spaced 3-6 weeks apart to maximally decrease your pain.  After the  initial series, we generally will wait several months before considering a repeat injection of the same type.  If you have any questions, please call [phone] [facility]  Hydrocodone - apap 5-325 mg x 3 months escribed to pharmacy Fill dates 10/29/18, 11/28/18, 12/28/18 Baclofen 10 mg x 3 months to begin filling on 10/29/18

## 2018-10-11 NOTE — Progress Notes (Signed)
Nursing Pain Medication Assessment:  Safety precautions to be maintained throughout the outpatient stay will include: orient to surroundings, keep bed in low position, maintain call bell within reach at all times, provide assistance with transfer out of bed and ambulation.  Medication Inspection Compliance: Pill count conducted under aseptic conditions, in front of the patient. Neither the pills nor the bottle was removed from the patient's sight at any time. Once count was completed pills were immediately returned to the patient in their original bottle.  Medication: Hydrocodone/APAP Pill/Patch Count: 13 of 15 pills remain Pill/Patch Appearance: Markings consistent with prescribed medication Bottle Appearance: Standard pharmacy container. Clearly labeled. Filled Date: 70 / 08 / 2019 Last Medication intake:  Day before yesterday

## 2018-10-11 NOTE — Patient Instructions (Addendum)
____________________________________________________________________________________________  Medication Rules  Purpose: To inform patients, and their family members, of our rules and regulations.  Applies to: All patients receiving prescriptions (written or electronic).  Pharmacy of record: Pharmacy where electronic prescriptions will be sent. If written prescriptions are taken to a different pharmacy, please inform the nursing staff. The pharmacy listed in the electronic medical record should be the one where you would like electronic prescriptions to be sent.  Electronic prescriptions: In compliance with the [place] Strengthen Opioid Misuse Prevention (STOP) Act of 2017 (Session Law 2017-74/H243), effective November 01, 2018, all controlled substances must be electronically prescribed. Calling prescriptions to the pharmacy will cease to exist.  Prescription refills: Only during scheduled appointments. Applies to all prescriptions.  NOTE: The following applies primarily to controlled substances (Opioid* Pain Medications).   Patient's responsibilities: 1. Pain Pills: Bring all pain pills to every appointment (except for procedure appointments). 2. Pill Bottles: Bring pills in original pharmacy bottle. Always bring the newest bottle. Bring bottle, even if empty. 3. Medication refills: You are responsible for knowing and keeping track of what medications you take and those you need refilled. The day before your appointment: write a list of all prescriptions that need to be refilled. The day of the appointment: give the list to the admitting nurse. Prescriptions will be written only during appointments. If you forget a medication: it will not be "Called in", "Faxed", or "electronically sent". You will need to get another appointment to get these prescribed. No early refills. Do not call asking to have your prescription filled early. 4. Prescription Accuracy: You are responsible for  carefully inspecting your prescriptions before leaving our office. Have the discharge nurse carefully go over each prescription with you, before taking them home. Make sure that your name is accurately spelled, that your address is correct. Check the name and dose of your medication to make sure it is accurate. Check the number of pills, and the written instructions to make sure they are clear and accurate. Make sure that you are given enough medication to last until your next medication refill appointment. 5. Taking Medication: Take medication as prescribed. When it comes to controlled substances, taking less pills or less frequently than prescribed is permitted and encouraged. Never take more pills than instructed. Never take medication more frequently than prescribed.  6. Inform other Doctors: Always inform, all of your healthcare providers, of all the medications you take. 7. Pain Medication from other Providers: You are not allowed to accept any additional pain medication from any other Doctor or Healthcare provider. There are two exceptions to this rule. (see below) In the event that you require additional pain medication, you are responsible for notifying us, as stated below. 8. Medication Agreement: You are responsible for carefully reading and following our Medication Agreement. This must be signed before receiving any prescriptions from our practice. Safely store a copy of your signed Agreement. Violations to the Agreement will result in no further prescriptions. (Additional copies of our Medication Agreement are available upon request.) 9. Laws, Rules, & Regulations: All patients are expected to follow all Federal and State Laws, Statutes, Rules, & Regulations. Ignorance of the Laws does not constitute a valid excuse. The use of any illegal substances is prohibited. 10. Adopted CDC guidelines & recommendations: Target dosing levels will be at or below 60 MME/day. Use of benzodiazepines** is not  recommended.  Exceptions: There are only two exceptions to the rule of not receiving pain medications from other Healthcare Providers. 1.   Exception #1 (Emergencies): In the event of an emergency (i.e.: accident requiring emergency care), you are allowed to receive additional pain medication. However, you are responsible for: As soon as you are able, call our office [phone], at any time of the day or night, and leave a message stating your name, the date and nature of the emergency, and the name and dose of the medication prescribed. In the event that your call is answered by a member of our staff, make sure to document and save the date, time, and the name of the person that took your information.  2. Exception #2 (Planned Surgery): In the event that you are scheduled by another doctor or dentist to have any type of surgery or procedure, you are allowed (for a period no longer than 30 days), to receive additional pain medication, for the acute post-op pain. However, in this case, you are responsible for picking up a copy of our "Post-op Pain Management for Surgeons" handout, and giving it to your surgeon or dentist. This document is available at our office, and does not require an appointment to obtain it. Simply go to our office during business hours (Monday-Thursday from 8:00 AM to 4:00 PM) (Friday 8:00 AM to 12:00 Noon) or if you have a scheduled appointment with [place], prior to your surgery, and ask for it by name. In addition, you will need to provide [place] with your name, name of your surgeon, type of surgery, and date of procedure or surgery.  *Opioid medications include: morphine, codeine, oxycodone, oxymorphone, hydrocodone, hydromorphone, meperidine, tramadol, tapentadol, buprenorphine, fentanyl, methadone. **Benzodiazepine medications include: diazepam (Valium), alprazolam (Xanax), clonazepam (Klonopine), lorazepam (Ativan), clorazepate (Tranxene), chlordiazepoxide (Librium), estazolam (Prosom),  oxazepam (Serax), temazepam (Restoril), triazolam (Halcion) (Last updated: 12/29/2017) ____________________________________________________________________________________________   ____________________________________________________________________________________________  General Risks and Possible Complications  Patient Responsibilities: It is important that you read this as it is part of your informed consent. It is our duty to inform you of the risks and possible complications associated with treatments offered to you. It is your responsibility as a patient to read this and to ask questions about anything that is not clear or that you believe was not covered in this document.  Patient's Rights: You have the right to refuse treatment. You also have the right to change your mind, even after initially having agreed to have the treatment done. However, under this last option, if you wait until the last second to change your mind, you may be charged for the materials used up to that point.  Introduction: Medicine is not an Chief Strategy Officer. Everything in Medicine, including the lack of treatment(s), carries the potential for danger, harm, or loss (which is by definition: Risk). In Medicine, a complication is a secondary problem, condition, or disease that can aggravate an already existing one. All treatments carry the risk of possible complications. The fact that a side effects or complications occurs, does not imply that the treatment was conducted incorrectly. It must be clearly understood that these can happen even when everything is done following the highest safety standards.  No treatment: You can choose not to proceed with the proposed treatment alternative. The "PRO(s)" would include: avoiding the risk of complications associated with the therapy. The "CON(s)" would include: not getting any of the treatment benefits. These benefits fall under one of three categories: diagnostic; therapeutic;  and/or palliative. Diagnostic benefits include: getting information which can ultimately lead to improvement of the disease or symptom(s). Therapeutic benefits are those associated with the  successful treatment of the disease. Finally, palliative benefits are those related to the decrease of the primary symptoms, without necessarily curing the condition (example: decreasing the pain from a flare-up of a chronic condition, such as incurable terminal cancer).  General Risks and Complications: These are associated to most interventional treatments. They can occur alone, or in combination. They fall under one of the following six (6) categories: no benefit or worsening of symptoms; bleeding; infection; nerve damage; allergic reactions; and/or death. 1. No benefits or worsening of symptoms: In Medicine there are no guarantees, only probabilities. No healthcare provider can ever guarantee that a medical treatment will work, they can only state the probability that it may. Furthermore, there is always the possibility that the condition may worsen, either directly, or indirectly, as a consequence of the treatment. 2. Bleeding: This is more common if the patient is taking a blood thinner, either prescription or over the counter (example: Goody Powders, Fish oil, Aspirin, Garlic, etc.), or if suffering a condition associated with impaired coagulation (example: Hemophilia, cirrhosis of the liver, low platelet counts, etc.). However, even if you do not have one on these, it can still happen. If you have any of these conditions, or take one of these drugs, make sure to notify your treating physician. 3. Infection: This is more common in patients with a compromised immune system, either due to disease (example: diabetes, cancer, human immunodeficiency virus [HIV], etc.), or due to medications or treatments (example: therapies used to treat cancer and rheumatological diseases). However, even if you do not have one on these,  it can still happen. If you have any of these conditions, or take one of these drugs, make sure to notify your treating physician. 4. Nerve Damage: This is more common when the treatment is an invasive one, but it can also happen with the use of medications, such as those used in the treatment of cancer. The damage can occur to small secondary nerves, or to large primary ones, such as those in the spinal cord and brain. This damage may be temporary or permanent and it may lead to impairments that can range from temporary numbness to permanent paralysis and/or brain death. 5. Allergic Reactions: Any time a substance or material comes in contact with our body, there is the possibility of an allergic reaction. These can range from a mild skin rash (contact dermatitis) to a severe systemic reaction (anaphylactic reaction), which can result in death. 6. Death: In general, any medical intervention can result in death, most of the time due to an unforeseen complication. ____________________________________________________________________________________________  Epidural Steroid Injection Patient Information  Description: The epidural space surrounds the nerves as they exit the spinal cord.  In some patients, the nerves can be compressed and inflamed by a bulging disc or a tight spinal canal (spinal stenosis).  By injecting steroids into the epidural space, we can bring irritated nerves into direct contact with a potentially helpful medication.  These steroids act directly on the irritated nerves and can reduce swelling and inflammation which often leads to decreased pain.  Epidural steroids may be injected anywhere along the spine and from the neck to the low back depending upon the location of your pain.   After numbing the skin with local anesthetic (like Novocaine), a small needle is passed into the epidural space slowly.  You may experience a sensation of pressure while this is being done.  The entire block  usually last less than 10 minutes.  Conditions which may be  treated by epidural steroids:   Low back and leg pain  Neck and arm pain  Spinal stenosis  Post-laminectomy syndrome  Herpes zoster (shingles) pain  Pain from compression fractures  Preparation for the injection:  1. Do not eat any solid food or dairy products within 8 hours of your appointment.  2. You may drink clear liquids up to 3 hours before appointment.  Clear liquids include water, black coffee, juice or soda.  No milk or cream please. 3. You may take your regular medication, including pain medications, with a sip of water before your appointment  Diabetics should hold regular insulin (if taken separately) and take 1/2 normal NPH dos the morning of the procedure.  Carry some sugar containing items with you to your appointment. 4. A driver must accompany you and be prepared to drive you home after your procedure.  5. Bring all your current medications with your. 6. An IV may be inserted and sedation may be given at the discretion of the physician.   7. A blood pressure cuff, EKG and other monitors will often be applied during the procedure.  Some patients may need to have extra oxygen administered for a short period. 8. You will be asked to provide medical information, including your allergies, prior to the procedure.  We must know immediately if you are taking blood thinners (like Coumadin/Warfarin)  Or if you are allergic to IV iodine contrast (dye). We must know if you could possible be pregnant.  Possible side-effects:  Bleeding from needle site  Infection (rare, may require surgery)  Nerve injury (rare)  Numbness & tingling (temporary)  Difficulty urinating (rare, temporary)  Spinal headache ( a headache worse with upright posture)  Light -headedness (temporary)  Pain at injection site (several days)  Decreased blood pressure (temporary)  Weakness in arm/leg (temporary)  Pressure sensation in  back/neck (temporary)  Call if you experience:  Fever/chills associated with headache or increased back/neck pain.  Headache worsened by an upright position.  New onset weakness or numbness of an extremity below the injection site  Hives or difficulty breathing (go to the emergency room)  Inflammation or drainage at the infection site  Severe back/neck pain  Any new symptoms which are concerning to you  Please note:  Although the local anesthetic injected can often make your back or neck feel good for several hours after the injection, the pain will likely return.  It takes 3-7 days for steroids to work in the epidural space.  You may not notice any pain relief for at least that one week.  If effective, we will often do a series of three injections spaced 3-6 weeks apart to maximally decrease your pain.  After the initial series, we generally will wait several months before considering a repeat injection of the same type.  If you have any questions, please call [phone] [facility]  Hydrocodone - apap 5-325 mg x 3 months escribed to pharmacy Fill dates 10/29/18, 11/28/18, 12/28/18 Baclofen 10 mg x 3 months to begin filling on 10/29/18

## 2018-10-18 ENCOUNTER — Ambulatory Visit: Payer: 59 | Admitting: Family Medicine

## 2018-12-15 ENCOUNTER — Other Ambulatory Visit: Payer: Self-pay | Admitting: Family Medicine

## 2018-12-15 NOTE — Telephone Encounter (Signed)
Requested Prescriptions  Pending Prescriptions Disp Refills  . omeprazole (PRILOSEC) 20 MG capsule [Pharmacy Med Name: OMEPRAZOLE DR 20 MG CAPSULE] 90 capsule 0    Sig: TAKE 1 CAPSULE BY MOUTH EVERY DAY     Gastroenterology: Proton Pump Inhibitors Passed - 12/15/2018  1:58 AM      Passed - Valid encounter within last 12 months    Recent Outpatient Visits          2 months ago Essential hypertension, benign   [facility] [name] [place], [place]   8 months ago Healthcare maintenance   [place], NP   9 months ago Essential hypertension, benign   [facility] Kathrine Haddock, NP   10 months ago Upper respiratory tract infection, unspecified type   [facility], [name], [place]   1 year ago Essential hypertension, benign   [facility] Kathrine Haddock, NP      Future Appointments            In 4 months Orene Desanctis, [name], [place], [place]

## 2018-12-19 ENCOUNTER — Other Ambulatory Visit: Payer: Self-pay | Admitting: Family Medicine

## 2018-12-19 ENCOUNTER — Other Ambulatory Visit: Payer: Self-pay | Admitting: Unknown Physician Specialty

## 2018-12-19 NOTE — Telephone Encounter (Signed)
Requested Prescriptions  Pending Prescriptions Disp Refills  . hydrOXYzine (ATARAX/VISTARIL) 25 MG tablet [Pharmacy Med Name: HYDROXYZINE HCL 25 MG TABLET] 90 tablet 1    Sig: TAKE 1 TABLET BY MOUTH THREE TIMES A DAY AS NEEDED     Ear, Nose, and Throat:  Antihistamines Passed - 12/19/2018  1:40 AM      Passed - Valid encounter within last 12 months    Recent Outpatient Visits          2 months ago Essential hypertension, benign   [place], [place], [place]   8 months ago Healthcare maintenance   [place], NP   9 months ago Essential hypertension, benign   [facility] Kathrine Haddock, NP   11 months ago Upper respiratory tract infection, unspecified type   [facility], [name], [place]   1 year ago Essential hypertension, benign   [facility] Kathrine Haddock, NP      Future Appointments            In 3 months Orene Desanctis, [name], [place], [place]

## 2018-12-19 NOTE — Telephone Encounter (Signed)
Requested medication (s) are due for refill today: yes  Requested medication (s) are on the active medication list: yes    Last refill: 02/24/18 #180  3 refills  Future visit scheduled   Yes   04/17/2019   [address]  Notes to clinic:not delegated  Requested Prescriptions  Pending Prescriptions Disp Refills   ondansetron (ZOFRAN) 8 MG tablet [Pharmacy Med Name: ONDANSETRON HCL 8 MG TAB] 180 tablet 3    Sig: TAKE ONE TABLET TWICE DAILY     Not Delegated - Gastroenterology: Antiemetics Failed - 12/19/2018  5:26 PM      Failed - This refill cannot be delegated      Passed - Valid encounter within last 6 months    Recent Outpatient Visits          2 months ago Essential hypertension, benign   [facility] [place]ney [place], [place]   8 months ago Healthcare maintenance   [place], NP   9 months ago Essential hypertension, benign   [facility] Kathrine Haddock, NP   11 months ago Upper respiratory tract infection, unspecified type   [facility], [name], [place]   1 year ago Essential hypertension, benign   [facility] Kathrine Haddock, NP      Future Appointments            In 3 months Orene Desanctis, [name], [employer], [facility]

## 2018-12-28 ENCOUNTER — Encounter (INDEPENDENT_AMBULATORY_CARE_PROVIDER_SITE_OTHER): Payer: Self-pay

## 2018-12-28 ENCOUNTER — Ambulatory Visit
Admission: RE | Admit: 2018-12-28 | Discharge: 2018-12-28 | Disposition: A | Discharge status: Home / Self Care | Payer: 59 | Source: Ambulatory Visit | Attending: Pain Medicine | Admitting: Pain Medicine

## 2018-12-28 ENCOUNTER — Other Ambulatory Visit: Payer: Self-pay

## 2018-12-28 ENCOUNTER — Ambulatory Visit (HOSPITAL_BASED_OUTPATIENT_CLINIC_OR_DEPARTMENT_OTHER): Payer: 59 | Admitting: Pain Medicine

## 2018-12-28 ENCOUNTER — Encounter: Payer: Self-pay | Admitting: Pain Medicine

## 2018-12-28 VITALS — BP 108/66 | HR 66 | Temp 98.4°F | Resp 12 | Ht 67.0 in | Wt 185.0 lb

## 2018-12-28 DIAGNOSIS — M47816 Spondylosis without myelopathy or radiculopathy, lumbar region: Secondary | ICD-10-CM | POA: Diagnosis present

## 2018-12-28 DIAGNOSIS — M47817 Spondylosis without myelopathy or radiculopathy, lumbosacral region: Secondary | ICD-10-CM | POA: Diagnosis not present

## 2018-12-28 DIAGNOSIS — G8929 Other chronic pain: Secondary | ICD-10-CM | POA: Diagnosis present

## 2018-12-28 DIAGNOSIS — M545 Low back pain: Secondary | ICD-10-CM | POA: Diagnosis present

## 2018-12-28 DIAGNOSIS — M5136 Other intervertebral disc degeneration, lumbar region: Secondary | ICD-10-CM

## 2018-12-28 MED ORDER — MIDAZOLAM HCL 5 MG/5ML IJ SOLN
1.0000 mg | INTRAMUSCULAR | Status: DC | PRN
  Administered 2018-12-28: 3 mg via INTRAVENOUS
  Filled 2018-12-28: qty 5

## 2018-12-28 MED ORDER — LIDOCAINE HCL 2 % IJ SOLN
20.0000 mL | Freq: Once | INTRAMUSCULAR | Status: AC
  Administered 2018-12-28: 400 mg
  Filled 2018-12-28: qty 40

## 2018-12-28 MED ORDER — ROPIVACAINE HCL 2 MG/ML IJ SOLN
9.0000 mL | Freq: Once | INTRAMUSCULAR | Status: AC
  Administered 2018-12-28: 9 mL via PERINEURAL
  Filled 2018-12-28: qty 10

## 2018-12-28 MED ORDER — FENTANYL CITRATE (PF) 100 MCG/2ML IJ SOLN
25.0000 ug | INTRAMUSCULAR | Status: DC | PRN
  Administered 2018-12-28: 100 ug via INTRAVENOUS
  Filled 2018-12-28: qty 2

## 2018-12-28 MED ORDER — LACTATED RINGERS IV SOLN
1000.0000 mL | Freq: Once | INTRAVENOUS | Status: AC
  Administered 2018-12-28: 1000 mL via INTRAVENOUS

## 2018-12-28 MED ORDER — TRIAMCINOLONE ACETONIDE 40 MG/ML IJ SUSP
40.0000 mg | Freq: Once | INTRAMUSCULAR | Status: AC
  Administered 2018-12-28: 40 mg
  Filled 2018-12-28: qty 1

## 2018-12-28 NOTE — Progress Notes (Signed)
Safety precautions to be maintained throughout the outpatient stay will include: orient to surroundings, keep bed in low position, maintain call bell within reach at all times, provide assistance with transfer out of bed and ambulation.  

## 2018-12-28 NOTE — Patient Instructions (Addendum)
____________________________________________________________________________________________  Post-Procedure Discharge Instructions  Instructions:  Apply ice:   Purpose: This will minimize any swelling and discomfort after procedure.   When: Day of procedure, as soon as you get home.  How: Fill a plastic sandwich bag with crushed ice. Cover it with a small towel and apply to injection site.  How long: (15 min on, 15 min off) Apply for 15 minutes then remove x 15 minutes.  Repeat sequence on day of procedure, until you go to bed.  Apply heat:   Purpose: To treat any soreness and discomfort from the procedure.  When: Starting the next day after the procedure.  How: Apply heat to procedure site starting the day following the procedure.  How long: May continue to repeat daily, until discomfort goes away.  Food intake: Start with clear liquids (like water) and advance to regular food, as tolerated.   Physical activities: Keep activities to a minimum for the first 8 hours after the procedure. After that, then as tolerated.  Driving: If you have received any sedation, be responsible and do not drive. You are not allowed to drive for 24 hours after having sedation.  Blood thinner: (Applies only to those taking blood thinners) You may restart your blood thinner 6 hours after your procedure.  Insulin: (Applies only to Diabetic patients taking insulin) As soon as you can eat, you may resume your normal dosing schedule.  Infection prevention: Keep procedure site clean and dry. Shower daily and clean area with soap and water.  Post-procedure Pain Diary: Extremely important that this be done correctly and accurately. Recorded information will be used to determine the next step in treatment. For the purpose of accuracy, follow these rules:  Evaluate only the area treated. Do not report or include pain from an untreated area. For the purpose of this evaluation, ignore all other areas of pain,  except for the treated area.  After your procedure, avoid taking a long nap and attempting to complete the pain diary after you wake up. Instead, set your alarm clock to go off every hour, on the hour, for the initial 8 hours after the procedure. Document the duration of the numbing medicine, and the relief you are getting from it.  Do not go to sleep and attempt to complete it later. It will not be accurate. If you received sedation, it is likely that you were given a medication that may cause amnesia. Because of this, completing the diary at a later time may cause the information to be inaccurate. This information is needed to plan your care.  Follow-up appointment: Keep your post-procedure follow-up evaluation appointment after the procedure (usually 2 weeks for most procedures, 6 weeks for radiofrequencies). DO NOT FORGET to bring you pain diary with you.   Expect: (What should I expect to see with my procedure?)  From numbing medicine (AKA: Local Anesthetics): Numbness or decrease in pain. You may also experience some weakness, which if present, could last for the duration of the local anesthetic.  Onset: Full effect within 15 minutes of injected.  Duration: It will depend on the type of local anesthetic used. On the average, 1 to 8 hours.   From steroids (Applies only if steroids were used): Decrease in swelling or inflammation. Once inflammation is improved, relief of the pain will follow.  Onset of benefits: Depends on the amount of swelling present. The more swelling, the longer it will take for the benefits to be seen. In some cases, up to 10 days.    Duration: Steroids will stay in the system x 2 weeks. Duration of benefits will depend on multiple posibilities including persistent irritating factors.  Side-effects: If present, they may typically last 2 weeks (the duration of the steroids).  Frequent: Cramps (if they occur, drink Gatorade and take over-the-counter Magnesium 450-500 mg  once to twice a day); water retention with temporary weight gain; increases in blood sugar; decreased immune system response; increased appetite.  Occasional: Facial flushing (red, warm cheeks); mood swings; menstrual changes.  Uncommon: Long-term decrease or suppression of natural hormones; bone thinning. (These are more common with higher doses or more frequent use. This is why we prefer that our patients avoid having any injection therapies in other practices.)   Very Rare: Severe mood changes; psychosis; aseptic necrosis.  From procedure: Some discomfort is to be expected once the numbing medicine wears off. This should be minimal if ice and heat are applied as instructed.  Call if: (When should I call?)  You experience numbness and weakness that gets worse with time, as opposed to wearing off.  New onset bowel or bladder incontinence. (Applies only to procedures done in the spine)  Emergency Numbers:  Durning business hours (Monday - Thursday, 8:00 AM - 4:00 PM) (Friday, 9:00 AM - 12:00 Noon): [phone]  After hours: [phone]  NOTE: If you are having a problem and are unable connect with, or to talk to a provider, then go to your nearest urgent care or emergency department. If the problem is serious and urgent, please call 911. ____________________________________________________________________________________________    ______________________________________________________________________________________________  Specialty Pain Scale  Introduction:  There are significant differences in how pain is reported. The word pain usually refers to physical pain, but it is also a common synonym of suffering. The medical community uses a scale from 0 (zero) to 10 (ten) to report pain level. Zero (0) is described as "no pain", while ten (10) is described as "the worse pain you can imagine". The problem with this scale is that physical pain is reported along with suffering.  Suffering refers to mental pain, or more often yet it refers to any unpleasant feeling, emotion or aversion associated with the perception of harm or threat of harm. It is the psychological component of pain.  Pain Specialists prefer to separate the two components. The pain scale used by this practice is the Verbal Numerical Rating Scale (VNRS-11). This scale is for the physical pain only. DO NOT INCLUDE how your pain psychologically affects you. This scale is for adults 21 years of age and older. It has 11 (eleven) levels. The 1st level is 0/10. This means: "right now, I have no pain". In the context of pain management, it also means: "right now, my physical pain is under control with the current therapy".  General Information:  The scale should reflect your current level of pain. Unless you are specifically asked for the level of your worst pain, or your average pain. If you are asked for one of these two, then it should be understood that it is over the past 24 hours.  Levels 1 (one) through 5 (five) are described below, and can be treated as an outpatient. Ambulatory pain management facilities such as ours are more than adequate to treat these levels. Levels 6 (six) through 10 (ten) are also described below, however, these must be treated as a hospitalized patient. While levels 6 (six) and 7 (seven) may be evaluated at an urgent care facility, levels 8 (eight) through 10 (ten)   constitute medical emergencies and as such, they belong in a hospital's emergency department. When having these levels (as described below), do not come to our office. Our facility is not equipped to manage these levels. Go directly to an urgent care facility or an emergency department to be evaluated.  Definitions:  Activities of Daily Living (ADL): Activities of daily living (ADL or ADLs) is a term used in healthcare to refer to people's daily self-care activities. Health professionals often use a person's ability or inability  to perform ADLs as a measurement of their functional status, particularly in regard to people post injury, with disabilities and the elderly. There are two ADL levels: Basic and Instrumental. Basic Activities of Daily Living (BADL  or BADLs) consist of self-care tasks that include: Bathing and showering; personal hygiene and grooming (including brushing/combing/styling hair); dressing; Toilet hygiene (getting to the toilet, cleaning oneself, and getting back up); eating and self-feeding (not including cooking or chewing and swallowing); functional mobility, often referred to as "transferring", as measured by the ability to walk, get in and out of bed, and get into and out of a chair; the broader definition (moving from one place to another while performing activities) is useful for people with different physical abilities who are still able to get around independently. Basic ADLs include the things many people do when they get up in the morning and get ready to go out of the house: get out of bed, go to the toilet, bathe, dress, groom, and eat. On the average, loss of function typically follows a particular order. Hygiene is the first to go, followed by loss of toilet use and locomotion. The last to go is the ability to eat. When there is only one remaining area in which the person is independent, there is a 62.9% chance that it is eating and only a 3.5% chance that it is hygiene. Instrumental Activities of Daily Living (IADL or IADLs) are not necessary for fundamental functioning, but they let an individual live independently in a community. IADL consist of tasks that include: cleaning and maintaining the house; home establishment and maintenance; care of others (including selecting and supervising caregivers); care of pets; child rearing; managing money; managing financials (investments, etc.); meal preparation and cleanup; shopping for groceries and necessities; moving within the community; safety procedures  and emergency responses; health management and maintenance (taking prescribed medications); and using the telephone or other form of communication.  Instructions:  Most patients tend to report their pain as a combination of two factors, their physical pain and their psychosocial pain. This last one is also known as "suffering" and it is reflection of how physical pain affects you socially and psychologically. From now on, report them separately.  From this point on, when asked to report your pain level, report only your physical pain. Use the following table for reference.  Pain Clinic Pain Levels (0-5/10)  Pain Level Score  Description  No Pain 0   Mild pain 1 Nagging, annoying, but does not interfere with basic activities of daily living (ADL). Patients are able to eat, bathe, get dressed, toileting (being able to get on and off the toilet and perform personal hygiene functions), transfer (move in and out of bed or a chair without assistance), and maintain continence (able to control bladder and bowel functions). Blood pressure and heart rate are unaffected. A normal heart rate for a healthy adult ranges from 60 to 100 bpm (beats per minute).   Mild to moderate pain 2 Noticeable   and distracting. Impossible to hide from other people. More frequent flare-ups. Still possible to adapt and function close to normal. It can be very annoying and may have occasional stronger flare-ups. With discipline, patients may get used to it and adapt.   Moderate pain 3 Interferes significantly with activities of daily living (ADL). It becomes difficult to feed, bathe, get dressed, get on and off the toilet or to perform personal hygiene functions. Difficult to get in and out of bed or a chair without assistance. Very distracting. With effort, it can be ignored when deeply involved in activities.   Moderately severe pain 4 Impossible to ignore for more than a few minutes. With effort, patients may still be able to  manage work or participate in some social activities. Very difficult to concentrate. Signs of autonomic nervous system discharge are evident: dilated pupils (mydriasis); mild sweating (diaphoresis); sleep interference. Heart rate becomes elevated (>115 bpm). Diastolic blood pressure (lower number) rises above 100 mmHg. Patients find relief in laying down and not moving.   Severe pain 5 Intense and extremely unpleasant. Associated with frowning face and frequent crying. Pain overwhelms the senses.  Ability to do any activity or maintain social relationships becomes significantly limited. Conversation becomes difficult. Pacing back and forth is common, as getting into a comfortable position is nearly impossible. Pain wakes you up from deep sleep. Physical signs will be obvious: pupillary dilation; increased sweating; goosebumps; brisk reflexes; cold, clammy hands and feet; nausea, vomiting or dry heaves; loss of appetite; significant sleep disturbance with inability to fall asleep or to remain asleep. When persistent, significant weight loss is observed due to the complete loss of appetite and sleep deprivation.  Blood pressure and heart rate becomes significantly elevated. Caution: If elevated blood pressure triggers a pounding headache associated with blurred vision, then the patient should immediately seek attention at an urgent or emergency care unit, as these may be signs of an impending stroke.    Emergency Department Pain Levels (6-10/10)  Emergency Room Pain 6 Severely limiting. Requires emergency care and should not be seen or managed at an outpatient pain management facility. Communication becomes difficult and requires great effort. Assistance to reach the emergency department may be required. Facial flushing and profuse sweating along with potentially dangerous increases in heart rate and blood pressure will be evident.   Distressing pain 7 Self-care is very difficult. Assistance is required to  transport, or use restroom. Assistance to reach the emergency department will be required. Tasks requiring coordination, such as bathing and getting dressed become very difficult.   Disabling pain 8 Self-care is no longer possible. At this level, pain is disabling. The individual is unable to do even the most "basic" activities such as walking, eating, bathing, dressing, transferring to a bed, or toileting. Fine motor skills are lost. It is difficult to think clearly.   Incapacitating pain 9 Pain becomes incapacitating. Thought processing is no longer possible. Difficult to remember your own name. Control of movement and coordination are lost.   The worst pain imaginable 10 At this level, most patients pass out from pain. When this level is reached, collapse of the autonomic nervous system occurs, leading to a sudden drop in blood pressure and heart rate. This in turn results in a temporary and dramatic drop in blood flow to the brain, leading to a loss of consciousness. Fainting is one of the body's self defense mechanisms. Passing out puts the brain in a calmed state and causes it to shut down   for a while, in order to begin the healing process.    Summary: 1.   Refer to this scale when providing us with your pain level. 2.   Be accurate and careful when reporting your pain level. This will help with your care. 3.   Over-reporting your pain level will lead to loss of credibility. 4.   Even a level of 1/10 means that there is pain and will be treated at our facility. 5.   High, inaccurate reporting will be documented as "Symptom Exaggeration", leading to loss of credibility and suspicions of possible secondary gains such as obtaining more narcotics, or wanting to appear disabled, for fraudulent reasons. 6.   Only pain levels of 5 or below will be seen at our facility. 7.   Pain levels of 6 and above will be sent to the Emergency Department and the appointment  cancelled.  ______________________________________________________________________________________________     

## 2018-12-28 NOTE — Progress Notes (Signed)
Patient's Name: Jose Washington  MRN: [id number]  Referring Provider: Volney American,*  DOB: 1966-09-21  PCP: Volney American, [place]  DOS: 12/28/2018  Note by: Gaspar Cola, MD  Service setting: Ambulatory outpatient  Specialty: Interventional Pain Management  Patient type: Established  Location: [facility] (AMB) Pain Management Facility  Visit type: Interventional Procedure   Primary Reason for Visit: Interventional Pain Management Treatment. CC: Back Pain (low)  Procedure:          Anesthesia, Analgesia, Anxiolysis:  Type: Lumbar Facet, Medial Branch Block(s) #1  Primary Purpose: Diagnostic Region: Posterolateral Lumbosacral Spine Level: L2, L3, L4, L5, & S1 Medial Branch Level(s). Injecting these levels blocks the L3-4, L4-5, and L5-S1 lumbar facet joints. Laterality: Right  Type: Moderate (Conscious) Sedation combined with Local Anesthesia Indication(s): Analgesia and Anxiety Route: Intravenous (IV) IV Access: Secured Sedation: Meaningful verbal contact was maintained at all times during the procedure  Local Anesthetic: Lidocaine 1-2%  Position: Prone   Indications: 1. Spondylosis without myelopathy or radiculopathy, lumbosacral region   2. Lumbar facet syndrome (Bilateral) (L>R)   3. Lumbar facet osteoarthritis   4. Lumbar facet hypertrophy (Multilevel) (Bilateral)   5. Lumbar facet arthropathy (Bilateral)   6. Lumbar spondylosis   7. DDD (degenerative disc disease), lumbar   8. Chronic low back pain (Secondary source of pain) (Bilateral) (L>R)    Pain Score: Pre-procedure: 2 /10 Post-procedure: 0-No pain/10  Pre-op Assessment:  Mr. Bouwens is a 53 y.o. (year old), male patient, seen today for interventional treatment. He  has a past surgical history that includes eye muscle repair (1972 and 1975) and bLEEDING ULCER (2012). Mr. Shea has a current medication list which includes the following prescription(s): acetaminophen, atenolol, atorvastatin, baclofen,  buspirone, calcium carbonate, vitamin d3, chondroitin sulfate, desloratadine-pseudoephedrine, diphenhydramine hcl, duloxetine, fenofibrate, fluticasone, glucosamine-chondroit-vit c-mn, hydrocodone-acetaminophen, hydrocodone-acetaminophen, hydrocodone-acetaminophen, hydroxyzine, ibuprofen, magnesium oxide, melatonin, methylsulfonylmethane, multivitamin, omeprazole, ondansetron, potassium, and vitamin b-12, and the following Facility-Administered Medications: fentanyl and midazolam. His primarily concern today is the Back Pain (low)  Initial Vital Signs:  Pulse/HCG Rate: 66ECG Heart Rate: 67 Temp: 97.7 F (36.5 C) Resp: 18 BP: 135/87 SpO2: 100 %  BMI: Estimated body mass index is 28.98 kg/m as calculated from the following:   Height as of this encounter: 5\' 7"  (1.702 m).   Weight as of this encounter: 185 lb (83.9 kg).  Risk Assessment: Allergies: Reviewed. He has No Known Allergies.  Allergy Precautions: None required Coagulopathies: Reviewed. None identified.  Blood-thinner therapy: None at this time Active Infection(s): Reviewed. None identified. Mr. Closser is afebrile  Site Confirmation: Mr. Susman was asked to confirm the procedure and laterality before marking the site Procedure checklist: Completed Consent: Before the procedure and under the influence of no sedative(s), amnesic(s), or anxiolytics, the patient was informed of the treatment options, risks and possible complications. To fulfill our ethical and legal obligations, as recommended by the American Medical Association's Code of Ethics, I have informed the patient of my clinical impression; the nature and purpose of the treatment or procedure; the risks, benefits, and possible complications of the intervention; the alternatives, including doing nothing; the risk(s) and benefit(s) of the alternative treatment(s) or procedure(s); and the risk(s) and benefit(s) of doing nothing. The patient was provided information about the  general risks and possible complications associated with the procedure. These may include, but are not limited to: failure to achieve desired goals, infection, bleeding, organ or nerve damage, allergic reactions, paralysis, and death. In addition, the patient was informed  of those risks and complications associated to Spine-related procedures, such as failure to decrease pain; infection (i.e.: Meningitis, epidural or intraspinal abscess); bleeding (i.e.: epidural hematoma, subarachnoid hemorrhage, or any other type of intraspinal or peri-dural bleeding); organ or nerve damage (i.e.: Any type of peripheral nerve, nerve root, or spinal cord injury) with subsequent damage to sensory, motor, and/or autonomic systems, resulting in permanent pain, numbness, and/or weakness of one or several areas of the body; allergic reactions; (i.e.: anaphylactic reaction); and/or death. Furthermore, the patient was informed of those risks and complications associated with the medications. These include, but are not limited to: allergic reactions (i.e.: anaphylactic or anaphylactoid reaction(s)); adrenal axis suppression; blood sugar elevation that in diabetics may result in ketoacidosis or comma; water retention that in patients with history of congestive heart failure may result in shortness of breath, pulmonary edema, and decompensation with resultant heart failure; weight gain; swelling or edema; medication-induced neural toxicity; particulate matter embolism and blood vessel occlusion with resultant organ, and/or nervous system infarction; and/or aseptic necrosis of one or more joints. Finally, the patient was informed that Medicine is not an exact science; therefore, there is also the possibility of unforeseen or unpredictable risks and/or possible complications that may result in a catastrophic outcome. The patient indicated having understood very clearly. We have given the patient no guarantees and we have made no promises.  Enough time was given to the patient to ask questions, all of which were answered to the patient's satisfaction. Mr. Macke has indicated that he wanted to continue with the procedure. Attestation: I, the ordering provider, attest that I have discussed with the patient the benefits, risks, side-effects, alternatives, likelihood of achieving goals, and potential problems during recovery for the procedure that I have provided informed consent. Date  Time: 12/28/2018 12:59 PM  Pre-Procedure Preparation:  Monitoring: As per clinic protocol. Respiration, ETCO2, SpO2, BP, heart rate and rhythm monitor placed and checked for adequate function Safety Precautions: Patient was assessed for positional comfort and pressure points before starting the procedure. Time-out: I initiated and conducted the "Time-out" before starting the procedure, as per protocol. The patient was asked to participate by confirming the accuracy of the "Time Out" information. Verification of the correct person, site, and procedure were performed and confirmed by me, the nursing staff, and the patient. "Time-out" conducted as per Joint Commission's Universal Protocol (UP.01.01.01). Time: 1344  Description of Procedure:          Laterality: Right Levels:  L2, L3, L4, L5, & S1 Medial Branch Level(s) Area Prepped: Posterior Lumbosacral Region Prepping solution: ChloraPrep (2% chlorhexidine gluconate and 70% isopropyl alcohol) Safety Precautions: Aspiration looking for blood return was conducted prior to all injections. At no point did we inject any substances, as a needle was being advanced. Before injecting, the patient was told to immediately notify me if he was experiencing any new onset of "ringing in the ears, or metallic taste in the mouth". No attempts were made at seeking any paresthesias. Safe injection practices and needle disposal techniques used. Medications properly checked for expiration dates. SDV (single dose vial)  medications used. After the completion of the procedure, all disposable equipment used was discarded in the proper designated medical waste containers. Local Anesthesia: Protocol guidelines were followed. The patient was positioned over the fluoroscopy table. The area was prepped in the usual manner. The time-out was completed. The target area was identified using fluoroscopy. A 12-in long, straight, sterile hemostat was used with fluoroscopic guidance to locate the targets  for each level blocked. Once located, the skin was marked with an approved surgical skin marker. Once all sites were marked, the skin (epidermis, dermis, and hypodermis), as well as deeper tissues (fat, connective tissue and muscle) were infiltrated with a small amount of a short-acting local anesthetic, loaded on a 10cc syringe with a 25G, 1.5-in  Needle. An appropriate amount of time was allowed for local anesthetics to take effect before proceeding to the next step. Local Anesthetic: Lidocaine 2.0% The unused portion of the local anesthetic was discarded in the proper designated containers. Technical explanation of process:  L2 Medial Branch Nerve Block (MBB): The target area for the L2 medial branch is at the junction of the postero-lateral aspect of the superior articular process and the superior, posterior, and medial edge of the transverse process of L3. Under fluoroscopic guidance, a Quincke needle was inserted until contact was made with os over the superior postero-lateral aspect of the pedicular shadow (target area). After negative aspiration for blood, 0.5 mL of the nerve block solution was injected without difficulty or complication. The needle was removed intact. L3 Medial Branch Nerve Block (MBB): The target area for the L3 medial branch is at the junction of the postero-lateral aspect of the superior articular process and the superior, posterior, and medial edge of the transverse process of L4. Under fluoroscopic guidance, a  Quincke needle was inserted until contact was made with os over the superior postero-lateral aspect of the pedicular shadow (target area). After negative aspiration for blood, 0.5 mL of the nerve block solution was injected without difficulty or complication. The needle was removed intact. L4 Medial Branch Nerve Block (MBB): The target area for the L4 medial branch is at the junction of the postero-lateral aspect of the superior articular process and the superior, posterior, and medial edge of the transverse process of L5. Under fluoroscopic guidance, a Quincke needle was inserted until contact was made with os over the superior postero-lateral aspect of the pedicular shadow (target area). After negative aspiration for blood, 0.5 mL of the nerve block solution was injected without difficulty or complication. The needle was removed intact. L5 Medial Branch Nerve Block (MBB): The target area for the L5 medial branch is at the junction of the postero-lateral aspect of the superior articular process and the superior, posterior, and medial edge of the sacral ala. Under fluoroscopic guidance, a Quincke needle was inserted until contact was made with os over the superior postero-lateral aspect of the pedicular shadow (target area). After negative aspiration for blood, 0.5 mL of the nerve block solution was injected without difficulty or complication. The needle was removed intact. S1 Medial Branch Nerve Block (MBB): The target area for the S1 medial branch is at the posterior and inferior 6 o'clock position of the L5-S1 facet joint. Under fluoroscopic guidance, the Quincke needle inserted for the L5 MBB was redirected until contact was made with os over the inferior and postero aspect of the sacrum, at the 6 o' clock position under the L5-S1 facet joint (Target area). After negative aspiration for blood, 0.5 mL of the nerve block solution was injected without difficulty or complication. The needle was removed  intact.  Nerve block solution: 0.2% PF-Ropivacaine + Triamcinolone (40 mg/mL) diluted to a final concentration of 4 mg of Triamcinolone/mL of Ropivacaine The unused portion of the solution was discarded in the proper designated containers. Procedural Needles: 22-gauge, 3.5-inch, Quincke needles used for all levels.  Once the entire procedure was completed, the treated area  was cleaned, making sure to leave some of the prepping solution back to take advantage of its long term bactericidal properties.   Illustration of the posterior view of the lumbar spine and the posterior neural structures. Laminae of L2 through S1 are labeled. DPRL5, dorsal primary ramus of L5; DPRS1, dorsal primary ramus of S1; DPR3, dorsal primary ramus of L3; FJ, facet (zygapophyseal) joint L3-L4; I, inferior articular process of L4; LB1, lateral branch of dorsal primary ramus of L1; IAB, inferior articular branches from L3 medial branch (supplies L4-L5 facet joint); IBP, intermediate branch plexus; MB3, medial branch of dorsal primary ramus of L3; NR3, third lumbar nerve root; S, superior articular process of L5; SAB, superior articular branches from L4 (supplies L4-5 facet joint also); TP3, transverse process of L3.  Vitals:   12/28/18 1350 12/28/18 1400 12/28/18 1410 12/28/18 1420  BP: (!) 130/103 111/78 115/78 108/66  Pulse:      Resp: 18 10 11 12   Temp:  98.4 F (36.9 C)  98.4 F (36.9 C)  TempSrc:  Temporal  Temporal  SpO2: 94% 94% 98% 98%  Weight:      Height:         Start Time: 1344 hrs. End Time: 1350 hrs.  Imaging Guidance (Spinal):          Type of Imaging Technique: Fluoroscopy Guidance (Spinal) Indication(s): Assistance in needle guidance and placement for procedures requiring needle placement in or near specific anatomical locations not easily accessible without such assistance. Exposure Time: Please see nurses notes. Contrast: None used. Fluoroscopic Guidance: I was personally present during the  use of fluoroscopy. "Tunnel Vision Technique" used to obtain the best possible view of the target area. Parallax error corrected before commencing the procedure. "Direction-depth-direction" technique used to introduce the needle under continuous pulsed fluoroscopy. Once target was reached, antero-posterior, oblique, and lateral fluoroscopic projection used confirm needle placement in all planes. Images permanently stored in EMR. Interpretation: No contrast injected. I personally interpreted the imaging intraoperatively. Adequate needle placement confirmed in multiple planes. Permanent images saved into the patient's record.  Antibiotic Prophylaxis:   Anti-infectives (From admission, onward)   None     Indication(s): None identified  Post-operative Assessment:  Post-procedure Vital Signs:  Pulse/HCG Rate: 6664 Temp: 98.4 F (36.9 C) Resp: 12 BP: 108/66 SpO2: 98 %  EBL: None  Complications: No immediate post-treatment complications observed by team, or reported by patient.  Note: The patient tolerated the entire procedure well. A repeat set of vitals were taken after the procedure and the patient was kept under observation following institutional policy, for this type of procedure. Post-procedural neurological assessment was performed, showing return to baseline, prior to discharge. The patient was provided with post-procedure discharge instructions, including a section on how to identify potential problems. Should any problems arise concerning this procedure, the patient was given instructions to immediately contact us, at any time, without hesitation. In any case, we plan to contact the patient by telephone for a follow-up status report regarding this interventional procedure.  Comments:  No additional relevant information.  Plan of Care    Imaging Orders     DG C-Arm 1-60 Min-No Report  Procedure Orders     LUMBAR FACET(MEDIAL BRANCH NERVE BLOCK) MBNB  Medications ordered for  procedure: Meds ordered this encounter  Medications  . lidocaine (XYLOCAINE) 2 % (with pres) injection 400 mg  . midazolam (VERSED) 5 MG/5ML injection 1-2 mg    Make sure Flumazenil is available in the pyxis when  using this medication. If oversedation occurs, administer 0.2 mg IV over 15 sec. If after 45 sec no response, administer 0.2 mg again over 1 min; may repeat at 1 min intervals; not to exceed 4 doses (1 mg)  . fentaNYL (SUBLIMAZE) injection 25-50 mcg    Make sure Narcan is available in the pyxis when using this medication. In the event of respiratory depression (RR< 8/min): Titrate NARCAN (naloxone) in increments of 0.1 to 0.2 mg IV at 2-3 minute intervals, until desired degree of reversal.  . lactated ringers infusion 1,000 mL  . ropivacaine (PF) 2 mg/mL (0.2%) (NAROPIN) injection 9 mL  . triamcinolone acetonide (KENALOG-40) injection 40 mg   Medications administered: We administered lidocaine, midazolam, fentaNYL, lactated ringers, ropivacaine (PF) 2 mg/mL (0.2%), and triamcinolone acetonide.  See the medical record for exact dosing, route, and time of administration.  Disposition: Discharge home  Discharge Date & Time: 12/28/2018; 1425 hrs.   Physician-requested Follow-up: Return for PPE (2 wks), w/ Dionisio David, NP.  Future Appointments  Date Time Provider [place]  01/16/2019 10:30 AM Vevelyn Francois, NP [facility] None  04/17/2019 10:00 AM Volney American, PA-C [facility]   Primary Care Physician: Volney American, PA-C Location: [facility] Facility Note by: Gaspar Cola, MD Date: 12/28/2018; Time: 3:23 PM  Disclaimer:  Medicine is not an Chief Strategy Officer. The only guarantee in medicine is that nothing is guaranteed. It is important to note that the decision to proceed with this intervention was based on the information collected from the patient. The Data and conclusions were drawn from the patient's questionnaire, the  interview, and the physical examination. Because the information was provided in large part by the patient, it cannot be guaranteed that it has not been purposely or unconsciously manipulated. Every effort has been made to obtain as much relevant data as possible for this evaluation. It is important to note that the conclusions that lead to this procedure are derived in large part from the available data. Always take into account that the treatment will also be dependent on availability of resources and existing treatment guidelines, considered by other Pain Management Practitioners as being common knowledge and practice, at the time of the intervention. For Medico-Legal purposes, it is also important to point out that variation in procedural techniques and pharmacological choices are the acceptable norm. The indications, contraindications, technique, and results of the above procedure should only be interpreted and judged by a Board-Certified Interventional Pain Specialist with extensive familiarity and expertise in the same exact procedure and technique.

## 2018-12-29 ENCOUNTER — Telehealth: Payer: Self-pay

## 2018-12-29 NOTE — Telephone Encounter (Signed)
Post procedure phone call.  Patient states he is doing wonderful 

## 2019-01-16 ENCOUNTER — Other Ambulatory Visit: Payer: Self-pay

## 2019-01-16 ENCOUNTER — Encounter: Payer: Self-pay | Admitting: Nurse Practitioner

## 2019-01-16 ENCOUNTER — Ambulatory Visit: Payer: 59 | Attending: Nurse Practitioner | Admitting: Nurse Practitioner

## 2019-01-16 VITALS — BP 120/82 | HR 75 | Temp 98.2°F | Resp 16 | Ht 67.0 in | Wt 185.0 lb

## 2019-01-16 DIAGNOSIS — G894 Chronic pain syndrome: Secondary | ICD-10-CM | POA: Insufficient documentation

## 2019-01-16 DIAGNOSIS — M47816 Spondylosis without myelopathy or radiculopathy, lumbar region: Secondary | ICD-10-CM | POA: Diagnosis present

## 2019-01-16 DIAGNOSIS — M62838 Other muscle spasm: Secondary | ICD-10-CM | POA: Diagnosis present

## 2019-01-16 DIAGNOSIS — M47812 Spondylosis without myelopathy or radiculopathy, cervical region: Secondary | ICD-10-CM

## 2019-01-16 MED ORDER — HYDROCODONE-ACETAMINOPHEN 5-325 MG PO TABS: 1.0000 | ORAL_TABLET | Freq: Every day | ORAL | 0 refills | Status: DC | PRN

## 2019-01-16 MED ORDER — BACLOFEN 10 MG PO TABS: 10.0000 mg | ORAL_TABLET | Freq: Every day | ORAL | 0 refills | Status: DC

## 2019-01-16 NOTE — Progress Notes (Signed)
Patient's Name: Jose Washington  MRN: [id number]  Referring Provider: Lane, Rachel Elizabeth,*  DOB: 08/13/1966  PCP: Lane, Rachel Elizabeth, PA-C  DOS: 01/16/2019  Note by: Crystal King, NP  Service setting: Ambulatory outpatient  Specialty: Interventional Pain Management  Location: [facility] (AMB) Pain Management Facility    Patient type: Established   HPI  Reason for Visit: Jose Washington is a 52 y.o. year old, male patient, who comes today with a chief complaint of Neck Pain (left radiates over to right) and Back Pain (lower) Last Appointment: His last appointment at our practice was on 12/29/2018. I last saw him on 10/11/2018.  Pain Assessment: Today, Jose Washington describes the severity of the Chronic pain as a 1 /10. He indicates the location/referral of the pain to be Neck Left, Right/TENS, streching, rest, tylenol meds motrin. Onset was:  . The quality of pain is described as Aching, Constant, Dull, Nagging(irriate nerve on right under right ear/neck). Temporal description, or timing of pain is:  . Possible modifying factors: left and right shoulder/neck. Jose Washington's  height is 5' 7" (1.702 m) and weight is 185 lb (83.9 kg). His temperature is 98.2 F (36.8 C). His blood pressure is 120/82 and his pulse is 75. His respiration is 16 and oxygen saturation is 100%.  He feels like the pain is on both side but greater on the right. He admits that it goes to the top of the shoulder and into the shoulder blade.   Controlled Substance Pharmacotherapy Assessment REMS (Risk Evaluation and Mitigation Strategy)  Analgesic:Hydrocodone/APAP 5/325 one daily ME/day:7.5mg/day.  Garner, Cynthia F, RN  01/16/2019 11:04 AM  Sign when Signing Visit Nursing Pain Medication Assessment:  Safety precautions to be maintained throughout the outpatient stay will include: orient to surroundings, keep bed in low position, maintain call bell within reach at all times, provide assistance with transfer out of bed and  ambulation.  Medication Inspection Compliance: Pill count conducted under aseptic conditions, in front of the patient. Neither the pills nor the bottle was removed from the patient's sight at any time. Once count was completed pills were immediately returned to the patient in their original bottle.  Medication: See above Pill/Patch Count: 2 of 15 pills remain Pill/Patch Appearance: Markings consistent with prescribed medication Bottle Appearance: Standard pharmacy container. Clearly labeled. Filled Date: 2 / 27 / 2020 Last Medication intake:  Day before yesterday   Pharmacokinetics: Liberation and absorption (onset of action): WNL Distribution (time to peak effect): WNL Metabolism and excretion (duration of action): WNL         Pharmacodynamics: Desired effects: Analgesia: Jose Washington reports >50% benefit. Functional ability: Patient reports that medication allows him to accomplish basic ADLs Clinically meaningful improvement in function (CMIF): Sustained CMIF goals met Perceived effectiveness: Described as relatively effective, allowing for increase in activities of daily living (ADL) Undesirable effects: Side-effects or Adverse reactions: None reported Monitoring: [place] PMP: Online review of the past 12-month period conducted. Compliant with practice rules and regulations Last UDS on record: Summary  Date Value Ref Range Status  07/13/2018 FINAL  Final    Comment:    ==================================================================== TOXASSURE SELECT 13 (MW) ==================================================================== Test                             Result       Flag       Units Drug Present and Declared for Prescription Verification   Hydrocodone                      29           EXPECTED   ng/mg creat   Norhydrocodone                 65           EXPECTED   ng/mg creat    Sources of hydrocodone include scheduled prescription    medications. Norhydrocodone is an expected  metabolite of    hydrocodone. ==================================================================== Test                      Result    Flag   Units      Ref Range   Creatinine              309              mg/dL      >=20 ==================================================================== Declared Medications:  The flagging and interpretation on this report are based on the  following declared medications.  Unexpected results may arise from  inaccuracies in the declared medications.  **Note: The testing scope of this panel includes these medications:  Hydrocodone (Hydrocodone-Acetaminophen)  **Note: The testing scope of this panel does not include following  reported medications:  Acetaminophen (Hydrocodone-Acetaminophen)  Acetaminophen (Tylenol)  Atenolol (Tenormin)  Atorvastatin (Lipitor)  Baclofen (Lioresal)  Buspirone (BuSpar)  Chondroitin  Chondroitin (Glucosamine-Chondroitin)  Cyanocobalamin  Desloratadine (Clarinex-D)  Duloxetine (Cymbalta)  Fenofibrate  Fluticasone (Flonase)  Glucosamine (Glucosamine-Chondroitin)  Ibuprofen  Magnesium Oxide  Melatonin  Methylsulfonylmethane  Multivitamin (MVI)  Omeprazole (Prilosec)  Ondansetron (Zofran)  Potassium  Pseudoephedrine (Clarinex-D)  Vitamin D3 ==================================================================== For clinical consultation, please call [phone]. ====================================================================    UDS interpretation: Compliant          Medication Assessment Form: Reviewed. Patient indicates being compliant with therapy Treatment compliance: Compliant Risk Assessment Profile: Aberrant behavior: See initial evaluations. None observed or detected today Comorbid factors increasing risk of overdose: See initial evaluation. No additional risks detected today Opioid risk tool (ORT):  Opioid Risk  01/16/2019  Alcohol -  Illegal Drugs -  Rx Drugs -  Alcohol 0  Illegal Drugs 0   Rx Drugs 0  Age between 16-45 years  -  History of Preadolescent Sexual Abuse 0  Psychological Disease 2  ADD Negative  OCD Negative  Bipolar Negative  Depression 1  Opioid Risk Tool Scoring 3  Opioid Risk Interpretation Low Risk    ORT Scoring interpretation table:  Score <3 = Low Risk for SUD  Score between 4-7 = Moderate Risk for SUD  Score >8 = High Risk for Opioid Abuse   Risk of substance use disorder (SUD): Low  Risk Mitigation Strategies:  Patient Counseling: Covered Patient-Prescriber Agreement (PPA): Present and active  Notification to other healthcare providers: Done  Pharmacologic Plan: No change in therapy, at this time.            Evaluation of last interventional procedure  12/28/2018 Procedure: Right Lumbar Facet Nerve Block Pre-procedure pain score:        /10 Post-procedure pain score: 0/10         Influential Factors: Intra-procedural challenges: None observed.         Reported side-effects: None.        Post-procedural adverse reactions or complications: None reported         Sedation: Please see nurses note for DOS. When no sedatives are used, the analgesic levels obtained are directly associated to the effectiveness of the local anesthetics. However, when   sedation is provided, the level of analgesia obtained during the initial 1 hour following the intervention, is believed to be the result of a combination of factors. These factors may include, but are not limited to: 1. The effectiveness of the local anesthetics used. 2. The effects of the analgesic(s) and/or anxiolytic(s) used. 3. The degree of discomfort experienced by the patient at the time of the procedure. 4. The patients ability and reliability in recalling and recording the events. 5. The presence and influence of possible secondary gains and/or psychosocial factors. Reported result: Relief experienced during the 1st hour after the procedure: 100 % (Ultra-Short Term Relief)             Interpretative annotation: Clinically appropriate result. Analgesia during this period is likely to be Local Anesthetic and/or IV Sedative (Analgesic/Anxiolytic) related.          Effects of local anesthetic: The analgesic effects attained during this period are directly associated to the localized infiltration of local anesthetics and therefore cary significant diagnostic value as to the etiological location, or anatomical origin, of the pain. Expected duration of relief is directly dependent on the pharmacodynamics of the local anesthetic used. Long-acting (4-6 hours) anesthetics used.  Reported result: Relief during the next 4 to 6 hour after the procedure: 100 % (Short-Term Relief)            Interpretative annotation: Clinically appropriate result. Analgesia during this period is likely to be Local Anesthetic-related.          Long-term benefit: Defined as the period of time past the expected duration of local anesthetics (1 hour for short-acting and 4-6 hours for long-acting). With the possible exception of prolonged sympathetic blockade from the local anesthetics, benefits during this period are typically attributed to, or associated with, other factors such as analgesic sensory neuropraxia, antiinflammatory effects, or beneficial biochemical changes provided by agents other than the local anesthetics.  Reported result: Extended relief following procedure: 100 % (Long-Term Relief)            Interpretative annotation: Clinically appropriate result. Complete relief.             ROS  Constitutional: Denies any fever or chills Gastrointestinal: No reported hemesis, hematochezia, vomiting, or acute GI distress Musculoskeletal: Denies any acute onset joint swelling, redness, loss of ROM, or weakness Neurological: No reported episodes of acute onset apraxia, aphasia, dysarthria, agnosia, amnesia, paralysis, loss of coordination, or loss of consciousness  Medication Review  Calcium Carbonate,  Chondroitin Sulfate, DULoxetine, Glucosamine-Chondroit-Vit C-Mn, HYDROcodone-acetaminophen, Melatonin, Methylsulfonylmethane, Potassium, Vitamin D3, acetaminophen, atenolol, atorvastatin, baclofen, busPIRone, desloratadine-pseudoephedrine, diphenhydrAMINE HCl, fenofibrate, fluticasone, hydrOXYzine, ibuprofen, magnesium oxide, multivitamin, omeprazole, ondansetron, and vitamin B-12  History Review  Allergy: Jose Washington has No Known Allergies. Drug: Jose Washington  reports no history of drug use. Alcohol:  reports no history of alcohol use. Tobacco:  reports that he has never smoked. He has never used smokeless tobacco. Social: Jose Washington  reports that he has never smoked. He has never used smokeless tobacco. He reports that he does not drink alcohol or use drugs. Medical:  has a past medical history of Allergy, Anxiety, Chronic duodenal ulcer with hemorrhage (2012), Chronic neck pain, Depression, Hyperlipidemia, Hypertension, and Microscopic hematuria. Surgical: Jose Washington  has a past surgical history that includes eye muscle repair [phone] and 1975) and bLEEDING ULCER (2012). Family: family history includes Allergies in his brother; Cancer in his maternal grandfather and mother; Dementia in his father and paternal grandfather; Depression in his father;  Diabetes in his mother; Mental illness in his father; Stroke in his paternal grandfather. Problem List: Jose Washington has Chronic neck pain (Primary Source of Pain) (Bilateral) (L>R); Chronic pain syndrome; Chronic low back pain (Secondary source of pain) (Bilateral) (L>R); Chronic upper back pain (Tertiary source of pain) (Bilateral) (L>R); Chronic shoulder pain (Bilateral) (L>R); Osteoarthritis of AC (acromioclavicular) joint (Right); Chronic sacroiliac joint pain (Bilateral) (L>R); Muscle spasticity; Cervical DDD (C4-5, C5-6, C6-7 and C7-T1); Cervical foraminal stenosis (Bilateral: C5-6 & C6-7, Left: C4-5 & C7-T1); Cervical radiculitis (Bilateral) (L>R);  Cervical facet syndrome (HCC) (L); Cervical spondylosis; Musculoskeletal neck pain (trapezius) (Left); Chronic knee pain (Left); Chronic lower extremity pain (Left); Grade 1 Retrolisthesis of L3 over L4; Lumbar facet arthropathy (Bilateral); Lumbar facet osteoarthritis; Lumbar facet syndrome (Bilateral) (L>R); Lumbar spondylosis; Chronic right shoulder pain; and Suprascapular neuropathy, right on their pertinent problem list.  Lab Review  Kidney Function Lab Results  Component Value Date   BUN 9 10/03/2018   CREATININE 0.99 10/03/2018   BCR 9 10/03/2018   GFRAA 101 10/03/2018   GFRNONAA 87 10/03/2018  Liver Function Lab Results  Component Value Date   AST 31 10/03/2018   ALT 39 10/03/2018   ALBUMIN 4.5 10/03/2018  Note: Above Lab results reviewed.  Imaging Review  DG C-Arm 1-60 Min-No Report Fluoroscopy was utilized by the requesting physician.  No radiographic  interpretation.  Note: Reviewed        Physical Exam  General appearance: Well nourished, well developed, and well hydrated. In no apparent acute distress Mental status: Alert, oriented x 3 (person, place, & time)       Respiratory: No evidence of acute respiratory distress Eyes: PERLA Vitals: BP 120/82   Pulse 75   Temp 98.2 F (36.8 C)   Resp 16   Ht 5' 7" (1.702 m)   Wt 185 lb (83.9 kg)   SpO2 100%   BMI 28.98 kg/m  BMI: Estimated body mass index is 28.98 kg/m as calculated from the following:   Height as of this encounter: 5' 7" (1.702 m).   Weight as of this encounter: 185 lb (83.9 kg). Ideal: Ideal body weight: 66.1 kg (145 lb 11.6 oz) Adjusted ideal body weight: 73.2 kg (161 lb 6.9 oz) Cervical Spine Area Exam  Skin & Axial Inspection: No masses, redness, edema, swelling, or associated skin lesions Alignment: Symmetrical Functional ROM: Adequate ROM      Stability: No instability detected Muscle Tone/Strength: Functionally intact. No obvious neuro-muscular anomalies detected. Sensory (Neurological):  Unimpaired Palpation: Tender             Upper Extremity (UE) Exam    Side: Right upper extremity  Side: Left upper extremity  Skin & Extremity Inspection: Skin color, temperature, and hair growth are WNL. No peripheral edema or cyanosis. No masses, redness, swelling, asymmetry, or associated skin lesions. No contractures.  Skin & Extremity Inspection: Skin color, temperature, and hair growth are WNL. No peripheral edema or cyanosis. No masses, redness, swelling, asymmetry, or associated skin lesions. No contractures.  Functional ROM: Unrestricted ROM          Functional ROM: Unrestricted ROM          Muscle Tone/Strength: Functionally intact. No obvious neuro-muscular anomalies detected.  Muscle Tone/Strength: Functionally intact. No obvious neuro-muscular anomalies detected.  Sensory (Neurological): Referred pain pattern          Sensory (Neurological): Referred pain pattern          Palpation: Complains of area being tender   to palpation              Palpation: No palpable anomalies                   Assessment   Status Diagnosis  Controlled Controlled Controlled 1. Cervical spondylosis   2. Lumbar spondylosis   3. Muscle spasticity   4. Chronic pain syndrome      Updated Problems: No problems updated.  Plan of Care  Pharmacotherapy (Medications Ordered): Meds ordered this encounter  Medications  . baclofen (LIORESAL) 10 MG tablet    Sig: Take 1 tablet (10 mg total) by mouth at bedtime.    Dispense:  90 tablet    Refill:  0    Do not place medication on "Automatic Refill". Fill one day early if pharmacy is closed on scheduled refill date.    Order Specific Question:   Supervising Provider    Answer:   Milinda Pointer [phone]  . HYDROcodone-acetaminophen (NORCO/VICODIN) 5-325 MG tablet    Sig: Take 1 tablet by mouth daily as needed for up to 30 days for moderate pain.    Dispense:  15 tablet    Refill:  0    Do not place this medication, or any other prescription from our  practice, on "Automatic Refill". Patient may have prescription filled one day early if pharmacy is closed on scheduled refill date.    Order Specific Question:   Supervising Provider    Answer:   Milinda Pointer [phone]  . HYDROcodone-acetaminophen (NORCO/VICODIN) 5-325 MG tablet    Sig: Take 1 tablet by mouth daily as needed for up to 30 days for moderate pain.    Dispense:  15 tablet    Refill:  0    Do not place this medication, or any other prescription from our practice, on "Automatic Refill". Patient may have prescription filled one day early if pharmacy is closed on scheduled refill date.    Order Specific Question:   Supervising Provider    Answer:   Milinda Pointer [phone]  . HYDROcodone-acetaminophen (NORCO/VICODIN) 5-325 MG tablet    Sig: Take 1 tablet by mouth daily as needed for up to 30 days for moderate pain.    Dispense:  15 tablet    Refill:  0    Do not place this medication, or any other prescription from our practice, on "Automatic Refill". Patient may have prescription filled one day early if pharmacy is closed on scheduled refill date.    Order Specific Question:   Supervising Provider    Answer:   Milinda Pointer [[id number]]   Administered today: Sallee Lange. Keller had no medications administered during this visit.  Orders:  No orders of the defined types were placed in this encounter.  Follow-up plan:   Return in about 3 months (around 04/18/2019) for MedMgmt, and, Procedure(w/Sedation), w/ Dr. Dossie Arbour, (B) C Fct NB. Diagnostic bilateral cervical facetblock    Interventional options: Considering: Diagnostic bilateral lumbar facetblock  Possible bilateral lumbar facet RFA Palliative left-sided cervical epidural steroid injection#2 Diagnostic bilateral cervical facetblock  Possible bilateral cervical facet RFA Diagnostic bilateral sacroiliac joint block Possible bilateral sacroiliac joint RFA Diagnostic trigger point injections    Palliative PRN treatment(s): Diagnostic left L4-5 interlaminar lumbar epidural steroid injection #2 Palliative left-sided cervical epidural steroid injection#2    Note by: Dionisio David, NP Date: 01/16/2019; Time: 2:29 PM

## 2019-01-16 NOTE — Patient Instructions (Addendum)
____________________________________________________________________________________________  Appointment Policy Summary  It is our goal and responsibility to provide the medical community with assistance in the evaluation and management of patients with chronic pain. Unfortunately our resources are limited. Because we do not have an unlimited amount of time, or available appointments, we are required to closely monitor and manage their use. The following rules exist to maximize their use:  Patient's responsibilities: 1. Punctuality:  At what time should I arrive? You should be physically present in our office 30 minutes before your scheduled appointment. Your scheduled appointment is with your assigned healthcare provider. However, it takes 5-10 minutes to be "checked-in", and another 15 minutes for the nurses to do the admission. If you arrive to our office at the time you were given for your appointment, you will end up being at least 20-25 minutes late to your appointment with the provider. 2. Tardiness:  What happens if I arrive only a few minutes after my scheduled appointment time? You will need to reschedule your appointment. The cutoff is your appointment time. This is why it is so important that you arrive at least 30 minutes before that appointment. If you have an appointment scheduled for 10:00 AM and you arrive at 10:01, you will be required to reschedule your appointment.  3. Plan ahead:  Always assume that you will encounter traffic on your way in. Plan for it. If you are dependent on a driver, make sure they understand these rules and the need to arrive early. 4. Other appointments and responsibilities:  Avoid scheduling any other appointments before or after your pain clinic appointments.  5. Be prepared:  Write down everything that you need to discuss with your healthcare provider and give this information to the admitting nurse. Write down the medications that you will need  refilled. Bring your pills and bottles (even the empty ones), to all of your appointments, except for those where a procedure is scheduled. 6. No children or pets:  Find someone to take care of them. It is not appropriate to bring them in. 7. Scheduling changes:  We request "advanced notification" of any changes or cancellations. 8. Advanced notification:  Defined as a time period of more than 24 hours prior to the originally scheduled appointment. This allows for the appointment to be offered to other patients. 9. Rescheduling:  When a visit is rescheduled, it will require the cancellation of the original appointment. For this reason they both fall within the category of "Cancellations".  10. Cancellations:  They require advanced notification. Any cancellation less than 24 hours before the  appointment will be recorded as a "No Show". 11. No Show:  Defined as an unkept appointment where the patient failed to notify or declare to the practice their intention or inability to keep the appointment.  Corrective process for repeat offenders:  1. Tardiness: Three (3) episodes of rescheduling due to late arrivals will be recorded as one (1) "No Show". 2. Cancellation or reschedule: Three (3) cancellations or rescheduling will be recorded as one (1) "No Show". 3. "No Shows": Three (3) "No Shows" within a 12 month period will result in discharge from the practice. ____________________________________________________________________________________________  ______________________________________________________________________________________________  Specialty Pain Scale  Introduction:  There are significant differences in how pain is reported. The word pain usually refers to physical pain, but it is also a common synonym of suffering. The medical community uses a scale from 0 (zero) to 10 (ten) to report pain level. Zero (0) is described as "no pain", while  ten (10) is described as "the worse pain  you can imagine". The problem with this scale is that physical pain is reported along with suffering. Suffering refers to mental pain, or more often yet it refers to any unpleasant feeling, emotion or aversion associated with the perception of harm or threat of harm. It is the psychological component of pain.  Pain Specialists prefer to separate the two components. The pain scale used by this practice is the Verbal Numerical Rating Scale (VNRS-11). This scale is for the physical pain only. DO NOT INCLUDE how your pain psychologically affects you. This scale is for adults 41 years of age and older. It has 11 (eleven) levels. The 1st level is 0/10. This means: "right now, I have no pain". In the context of pain management, it also means: "right now, my physical pain is under control with the current therapy".  General Information:  The scale should reflect your current level of pain. Unless you are specifically asked for the level of your worst pain, or your average pain. If you are asked for one of these two, then it should be understood that it is over the past 24 hours.  Levels 1 (one) through 5 (five) are described below, and can be treated as an outpatient. Ambulatory pain management facilities such as ours are more than adequate to treat these levels. Levels 6 (six) through 10 (ten) are also described below, however, these must be treated as a hospitalized patient. While levels 6 (six) and 7 (seven) may be evaluated at an urgent care facility, levels 8 (eight) through 10 (ten) constitute medical emergencies and as such, they belong in a hospital's emergency department. When having these levels (as described below), do not come to our office. Our facility is not equipped to manage these levels. Go directly to an urgent care facility or an emergency department to be evaluated.  Definitions:  Activities of Daily Living (ADL): Activities of daily living (ADL or ADLs) is a term used in healthcare to refer to  people's daily self-care activities. Health professionals often use a person's ability or inability to perform ADLs as a measurement of their functional status, particularly in regard to people post injury, with disabilities and the elderly. There are two ADL levels: Basic and Instrumental. Basic Activities of Daily Living (BADL  or BADLs) consist of self-care tasks that include: Bathing and showering; personal hygiene and grooming (including brushing/combing/styling hair); dressing; Toilet hygiene (getting to the toilet, cleaning oneself, and getting back up); eating and self-feeding (not including cooking or chewing and swallowing); functional mobility, often referred to as "transferring", as measured by the ability to walk, get in and out of bed, and get into and out of a chair; the broader definition (moving from one place to another while performing activities) is useful for people with different physical abilities who are still able to get around independently. Basic ADLs include the things many people do when they get up in the morning and get ready to go out of the house: get out of bed, go to the toilet, bathe, dress, groom, and eat. On the average, loss of function typically follows a particular order. Hygiene is the first to go, followed by loss of toilet use and locomotion. The last to go is the ability to eat. When there is only one remaining area in which the person is independent, there is a 62.9% chance that it is eating and only a 3.5% chance that it is hygiene. Instrumental Activities of  Daily Living (IADL or IADLs) are not necessary for fundamental functioning, but they let an individual live independently in a community. IADL consist of tasks that include: cleaning and maintaining the house; home establishment and maintenance; care of others (including selecting and supervising caregivers); care of pets; child rearing; managing money; managing financials (investments, etc.); meal preparation  and cleanup; shopping for groceries and necessities; moving within the community; safety procedures and emergency responses; health management and maintenance (taking prescribed medications); and using the telephone or other form of communication.  Instructions:  Most patients tend to report their pain as a combination of two factors, their physical pain and their psychosocial pain. This last one is also known as "suffering" and it is reflection of how physical pain affects you socially and psychologically. From now on, report them separately.  From this point on, when asked to report your pain level, report only your physical pain. Use the following table for reference.  Pain Clinic Pain Levels (0-5/10)  Pain Level Score  Description  No Pain 0   Mild pain 1 Nagging, annoying, but does not interfere with basic activities of daily living (ADL). Patients are able to eat, bathe, get dressed, toileting (being able to get on and off the toilet and perform personal hygiene functions), transfer (move in and out of bed or a chair without assistance), and maintain continence (able to control bladder and bowel functions). Blood pressure and heart rate are unaffected. A normal heart rate for a healthy adult ranges from 60 to 100 bpm (beats per minute).   Mild to moderate pain 2 Noticeable and distracting. Impossible to hide from other people. More frequent flare-ups. Still possible to adapt and function close to normal. It can be very annoying and may have occasional stronger flare-ups. With discipline, patients may get used to it and adapt.   Moderate pain 3 Interferes significantly with activities of daily living (ADL). It becomes difficult to feed, bathe, get dressed, get on and off the toilet or to perform personal hygiene functions. Difficult to get in and out of bed or a chair without assistance. Very distracting. With effort, it can be ignored when deeply involved in activities.   Moderately severe pain  4 Impossible to ignore for more than a few minutes. With effort, patients may still be able to manage work or participate in some social activities. Very difficult to concentrate. Signs of autonomic nervous system discharge are evident: dilated pupils (mydriasis); mild sweating (diaphoresis); sleep interference. Heart rate becomes elevated (>115 bpm). Diastolic blood pressure (lower number) rises above 100 mmHg. Patients find relief in laying down and not moving.   Severe pain 5 Intense and extremely unpleasant. Associated with frowning face and frequent crying. Pain overwhelms the senses.  Ability to do any activity or maintain social relationships becomes significantly limited. Conversation becomes difficult. Pacing back and forth is common, as getting into a comfortable position is nearly impossible. Pain wakes you up from deep sleep. Physical signs will be obvious: pupillary dilation; increased sweating; goosebumps; brisk reflexes; cold, clammy hands and feet; nausea, vomiting or dry heaves; loss of appetite; significant sleep disturbance with inability to fall asleep or to remain asleep. When persistent, significant weight loss is observed due to the complete loss of appetite and sleep deprivation.  Blood pressure and heart rate becomes significantly elevated. Caution: If elevated blood pressure triggers a pounding headache associated with blurred vision, then the patient should immediately seek attention at an urgent or emergency care unit, as these  may be signs of an impending stroke.    Emergency Department Pain Levels (6-10/10)  Emergency Room Pain 6 Severely limiting. Requires emergency care and should not be seen or managed at an outpatient pain management facility. Communication becomes difficult and requires great effort. Assistance to reach the emergency department may be required. Facial flushing and profuse sweating along with potentially dangerous increases in heart rate and blood pressure  will be evident.   Distressing pain 7 Self-care is very difficult. Assistance is required to transport, or use restroom. Assistance to reach the emergency department will be required. Tasks requiring coordination, such as bathing and getting dressed become very difficult.   Disabling pain 8 Self-care is no longer possible. At this level, pain is disabling. The individual is unable to do even the most "basic" activities such as walking, eating, bathing, dressing, transferring to a bed, or toileting. Fine motor skills are lost. It is difficult to think clearly.   Incapacitating pain 9 Pain becomes incapacitating. Thought processing is no longer possible. Difficult to remember your own name. Control of movement and coordination are lost.   The worst pain imaginable 10 At this level, most patients pass out from pain. When this level is reached, collapse of the autonomic nervous system occurs, leading to a sudden drop in blood pressure and heart rate. This in turn results in a temporary and dramatic drop in blood flow to the brain, leading to a loss of consciousness. Fainting is one of the body's self defense mechanisms. Passing out puts the brain in a calmed state and causes it to shut down for a while, in order to begin the healing process.    Summary: 1.   Refer to this scale when providing [place] with your pain level. 2.   Be accurate and careful when reporting your pain level. This will help with your care. 3.   Over-reporting your pain level will lead to loss of credibility. 4.   Even a level of 1/10 means that there is pain and will be treated at our facility. 5.   High, inaccurate reporting will be documented as "Symptom Exaggeration", leading to loss of credibility and suspicions of possible secondary gains such as obtaining more narcotics, or wanting to appear disabled, for fraudulent reasons. 6.   Only pain levels of 5 or below will be seen at our facility. 7.   Pain levels of 6 and above will be  sent to the Emergency Department and the appointment cancelled.  ______________________________________________________________________________________________     ____________________________________________________________________________________________  Preparing for Procedure with Sedation  Procedure appointments are limited to planned procedures: . No Prescription Refills. . No disability issues will be discussed. . No medication changes will be discussed.  Instructions: . Oral Intake: Do not eat or drink anything for at least 8 hours prior to your procedure. . Transportation: Public transportation is not allowed. Bring an adult driver. The driver must be physically present in our waiting room before any procedure can be started. Marland Kitchen Physical Assistance: Bring an adult physically capable of assisting you, in the event you need help. This adult should keep you company at home for at least 6 hours after the procedure. . Blood Pressure Medicine: Take your blood pressure medicine with a sip of water the morning of the procedure. . Blood thinners: Notify our staff if you are taking any blood thinners. Depending on which one you take, there will be specific instructions on how and when to stop it. . Diabetics on insulin: Notify  the staff so that you can be scheduled 1st case in the morning. If your diabetes requires high dose insulin, take only  of your normal insulin dose the morning of the procedure and notify the staff that you have done so. . Preventing infections: Shower with an antibacterial soap the morning of your procedure. . Build-up your immune system: Take 1000 mg of Vitamin C with every meal (3 times a day) the day prior to your procedure. Marland Kitchen Antibiotics: Inform the staff if you have a condition or reason that requires you to take antibiotics before dental procedures. . Pregnancy: If you are pregnant, call and cancel the procedure. . Sickness: If you have a cold, fever, or any  active infections, call and cancel the procedure. . Arrival: You must be in the facility at least 30 minutes prior to your scheduled procedure. . Children: Do not bring children with you. . Dress appropriately: Bring dark clothing that you would not mind if they get stained. . Valuables: Do not bring any jewelry or valuables.  Reasons to call and reschedule or cancel your procedure: (Following these recommendations will minimize the risk of a serious complication.) . Surgeries: Avoid having procedures within 2 weeks of any surgery. (Avoid for 2 weeks before or after any surgery). . Flu Shots: Avoid having procedures within 2 weeks of a flu shots or . (Avoid for 2 weeks before or after immunizations). . Barium: Avoid having a procedure within 7-10 days after having had a radiological study involving the use of radiological contrast. (Myelograms, Barium swallow or enema study). . Heart attacks: Avoid any elective procedures or surgeries for the initial 6 months after a "Myocardial Infarction" (Heart Attack). . Blood thinners: It is imperative that you stop these medications before procedures. Let us know if you if you take any blood thinner.  . Infection: Avoid procedures during or within two weeks of an infection (including chest colds or gastrointestinal problems). Symptoms associated with infections include: Localized redness, fever, chills, night sweats or profuse sweating, burning sensation when voiding, cough, congestion, stuffiness, runny nose, sore throat, diarrhea, nausea, vomiting, cold or Flu symptoms, recent or current infections. It is specially important if the infection is over the area that we intend to treat. Marland Kitchen Heart and lung problems: Symptoms that may suggest an active cardiopulmonary problem include: cough, chest pain, breathing difficulties or shortness of breath, dizziness, ankle swelling, uncontrolled high or unusually low blood pressure, and/or palpitations. If you are experiencing  any of these symptoms, cancel your procedure and contact your primary care physician for an evaluation.  Remember:  Regular Business hours are:  Monday to Thursday 8:00 AM to 4:00 PM  Provider's Schedule: Milinda Pointer, MD:  Procedure days: Tuesday and Thursday 7:30 AM to 4:00 PM  Gillis Santa, MD:  Procedure days: Monday and Wednesday 7:30 AM to 4:00 PM ____________________________________________________________________________________________   Facet Joint Block The facet joints connect the bones of the spine (vertebrae). They make it possible for you to bend, twist, and make other movements with your spine. They also keep you from bending too far, twisting too far, and making other excessive movements. A facet joint block is a procedure where a numbing medicine (anesthetic) is injected into a facet joint. Often, a type of anti-inflammatory medicine called a steroid is also injected. A facet joint block may be done to diagnose neck or back pain. If the pain gets better after a facet joint block, it means the pain is probably coming from the facet joint.  If the pain does not get better, it means the pain is probably not coming from the facet joint. A facet joint block may also be done to relieve neck or back pain caused by an inflamed facet joint. A facet joint block is only done to relieve pain if the pain does not improve with other methods, such as medicine, exercise programs, and physical therapy. Tell a health care provider about:  Any allergies you have.  All medicines you are taking, including vitamins, herbs, eye drops, creams, and over-the-counter medicines.  Any problems you or family members have had with anesthetic medicines.  Any blood disorders you have.  Any surgeries you have had.  Any medical conditions you have.  Whether you are pregnant or may be pregnant. What are the risks? Generally, this is a safe procedure. However, problems may occur,  including:  Bleeding.  Injury to a nerve near the injection site.  Pain at the injection site.  Weakness or numbness in areas controlled by nerves near the injection site.  Infection.  Temporary fluid retention.  Allergic reactions to medicines or dyes.  Injury to other structures or organs near the injection site. What happens before the procedure?  Follow instructions from your health care provider about eating or drinking restrictions.  Ask your health care provider about: ? Changing or stopping your regular medicines. This is especially important if you are taking diabetes medicines or blood thinners. ? Taking medicines such as aspirin and ibuprofen. These medicines can thin your blood. Do not take these medicines before your procedure if your health care provider instructs you not to.  Do not take any new dietary supplements or medicines without asking your health care provider first.  Plan to have someone take you home after the procedure. What happens during the procedure?   You may need to remove your clothing and dress in an open-back gown.  The procedure will be done while you are lying on an X-ray table. You will most likely be asked to lie on your stomach, but you may be asked to lie in a different position if an injection will be made in your neck.  Machines will be used to monitor your oxygen levels, heart rate, and blood pressure.  If an injection will be made in your neck, an IV tube will be inserted into one of your veins. Fluids and medicine will flow directly into your body through the IV tube.  The area over the facet joint where the injection will be made will be cleaned with soap. The surrounding skin will be covered with clean drapes.  A numbing medicine (local anesthetic) will be applied to your skin. Your skin may sting or burn for a moment.  A video X-ray machine (fluoroscopy) will be used to locate the joint. In some cases, a CT scan may be  used.  A contrast dye may be injected into the facet joint area to help locate the joint.  When the joint is located, an anesthetic will be injected into the joint through the needle.  Your health care provider will ask you whether you feel pain relief. If you do feel relief, a steroid may be injected to provide pain relief for a longer period of time. If you do not feel relief or feel only partial relief, additional injections of an anesthetic may be made in other facet joints.  The needle will be removed.  Your skin will be cleaned.  A bandage (dressing) will be applied over  each injection site. The procedure may vary among health care providers and hospitals. What happens after the procedure?  You will be observed for 15-30 minutes before being allowed to go home. This information is not intended to replace advice given to you by your health care provider. Make sure you discuss any questions you have with your health care provider. Document Released: 03/09/2007 Document Revised: 10/20/2017 Document Reviewed: 07/14/2015 Elsevier Interactive Patient Education  2019 [employer].

## 2019-01-16 NOTE — Progress Notes (Signed)
Nursing Pain Medication Assessment:  Safety precautions to be maintained throughout the outpatient stay will include: orient to surroundings, keep bed in low position, maintain call bell within reach at all times, provide assistance with transfer out of bed and ambulation.  Medication Inspection Compliance: Pill count conducted under aseptic conditions, in front of the patient. Neither the pills nor the bottle was removed from the patient's sight at any time. Once count was completed pills were immediately returned to the patient in their original bottle.  Medication: See above Pill/Patch Count: 2 of 15 pills remain Pill/Patch Appearance: Markings consistent with prescribed medication Bottle Appearance: Standard pharmacy container. Clearly labeled. Filled Date: 2 / 27 / 2020 Last Medication intake:  Day before yesterday

## 2019-02-01 ENCOUNTER — Ambulatory Visit: Payer: 59 | Admitting: Pain Medicine

## 2019-02-17 ENCOUNTER — Other Ambulatory Visit: Payer: Self-pay | Admitting: Nurse Practitioner

## 2019-02-17 NOTE — Telephone Encounter (Signed)
Can pt have a refill on this med 

## 2019-02-21 ENCOUNTER — Other Ambulatory Visit: Payer: Self-pay | Admitting: Family Medicine

## 2019-02-28 ENCOUNTER — Ambulatory Visit: Payer: 59 | Admitting: Nurse Practitioner

## 2019-02-28 ENCOUNTER — Other Ambulatory Visit: Payer: Self-pay

## 2019-03-01 ENCOUNTER — Ambulatory Visit: Payer: 59 | Admitting: Nurse Practitioner

## 2019-03-05 NOTE — Progress Notes (Signed)
Pain Management Virtual Encounter Note - Virtual Visit via Telephone Telehealth (real-time audio visits between healthcare provider and patient).  Patient's Phone No. & Preferred Pharmacy:  [phone] (home); There is no such number on file (mobile).; (Preferred) [phone] [email or web address]  [id number] [address], [place] [address] Phone: [phone] Fax: [phone]   Pre-screening note:  Our staff contacted Jose Washington and offered him an "in person", "face-to-face" appointment versus a telephone encounter. He indicated preferring the telephone encounter, at this time.  Reason for Virtual Visit: COVID-19*  Social distancing based on CDC and AMA recommendations.   I contacted Jose Washington on 03/07/2019 at 8:37 AM via telephone and clearly identified myself as Gaspar Cola, MD. I verified that I was speaking with the correct person using two identifiers (Name and date of birth: Mar 27, 1966).  Advanced Informed Consent I sought verbal advanced consent from Jose Washington for virtual visit interactions. I informed Jose Washington of possible security and privacy concerns, risks, and limitations associated with providing "not-in-person" medical evaluation and management services. I also informed Jose Washington of the availability of "in-person" appointments. Finally, I informed him that there would be a charge for the virtual visit and that he could be  personally, fully or partially, financially responsible for it. Jose Washington expressed understanding and agreed to proceed.   Historic Elements   Jose Washington is a 53 y.o. year old, male patient evaluated today after his last encounter by our practice on 01/16/2019. Jose Washington  has a past medical history of Allergy, Anxiety, Chronic duodenal ulcer with hemorrhage (2012), Chronic neck pain, Depression, Hyperlipidemia, Hypertension, and Microscopic hematuria. He also  has  a past surgical history that includes eye muscle repair (1972 and 1975) and bLEEDING ULCER (2012). Jose Washington has a current medication list which includes the following prescription(s): acetaminophen, atenolol, atorvastatin, baclofen, buspirone, calcium carbonate, vitamin d3, desloratadine-pseudoephedrine, diphenhydramine hcl, duloxetine, fenofibrate, fluticasone, glucosamine-chondroit-vit c-mn, hydrocodone-acetaminophen, hydrocodone-acetaminophen, hydroxyzine, ibuprofen, magnesium oxide, melatonin, methylsulfonylmethane, multivitamin, omeprazole, ondansetron, potassium, vitamin b-12, hydrocodone-acetaminophen, and prednisone. He  reports that he has never smoked. He has never used smokeless tobacco. He reports that he does not drink alcohol or use drugs. Jose Washington has No Known Allergies.   HPI  I last communicated with him on 12/28/2018. Today, he is being contacted for worsening of previously known (established) problem.  The patient indicates having a flareup of his right lower back pain going down through the buttocks area in the posterior aspect of the leg all the way down to the middle 2 toes on his foot.  He indicates that the worst pain is that of the lower back.  He also indicates not having any problems with taking oral steroids or having any diabetes.  Today we will increase his pain medication to 1 tablet/day # 30/month.  He used to get 15 tablets/month.  In addition to this, I will be sending a steroid taper pack to treat this acute episode.  Should he continue with this despite this conservative measures, then we will go ahead and plan on repeating his right-sided lumbar facet block.  He has already had one diagnostic lumbar facet block on the right side with excellent results.  He has also done physical therapy at [facility].  Pharmacotherapy Assessment  Analgesic: Hydrocodone/APAP 5/325 one daily ME/day:7.5mg /day.    Monitoring: Pharmacotherapy: No side-effects or adverse reactions  reported. [place] PMP: PDMP reviewed during this encounter.  Compliance: No problems identified or detected. Plan: Refer to "POC".  Review of recent tests  DG C-Arm 1-60 Min-No Report Fluoroscopy was utilized by the requesting physician.  No radiographic  interpretation.    Office Visit on 10/03/2018  Component Date Value Ref Range Status  . Glucose 10/03/2018 128* 65 - 99 mg/dL Final  . BUN 10/03/2018 9  6 - 24 mg/dL Final  . Creatinine, Ser 10/03/2018 0.99  0.76 - 1.27 mg/dL Final  . GFR calc non Af Amer 10/03/2018 87  >59 mL/min/1.73 Final  . GFR calc Af Amer 10/03/2018 101  >59 mL/min/1.73 Final  . BUN/Creatinine Ratio 10/03/2018 9  9 - 20 Final  . Sodium 10/03/2018 140  134 - 144 mmol/L Final  . Potassium 10/03/2018 4.0  3.5 - 5.2 mmol/L Final  . Chloride 10/03/2018 101  96 - 106 mmol/L Final  . CO2 10/03/2018 23  20 - 29 mmol/L Final  . Calcium 10/03/2018 9.7  8.7 - 10.2 mg/dL Final  . Total Protein 10/03/2018 6.5  6.0 - 8.5 g/dL Final  . Albumin 10/03/2018 4.5  3.5 - 5.5 g/dL Final  . Globulin, Total 10/03/2018 2.0  1.5 - 4.5 g/dL Final  . Albumin/Globulin Ratio 10/03/2018 2.3* 1.2 - 2.2 Final  . Bilirubin Total 10/03/2018 0.3  0.0 - 1.2 mg/dL Final  . Alkaline Phosphatase 10/03/2018 51  39 - 117 IU/L Final  . AST 10/03/2018 31  0 - 40 IU/L Final  . ALT 10/03/2018 39  0 - 44 IU/L Final  . Cholesterol, Total 10/03/2018 202* 100 - 199 mg/dL Final  . Triglycerides 10/03/2018 373* 0 - 149 mg/dL Final  . HDL 10/03/2018 36* >39 mg/dL Final  . VLDL Cholesterol Cal 10/03/2018 75* 5 - 40 mg/dL Final  . LDL Calculated 10/03/2018 91  0 - 99 mg/dL Final   Assessment  The primary encounter diagnosis was Chronic pain syndrome. Diagnoses of Chronic low back pain (Secondary source of pain) (Bilateral) (L>R), Inflammatory spondylopathy of lumbosacral region [facility]), and Chronic lower extremity pain (Right) were also pertinent to this visit.  Plan of Care  I have discontinued Sallee Lange. Tooker's Chondroitin Sulfate. I have also changed his HYDROcodone-acetaminophen, HYDROcodone-acetaminophen, and HYDROcodone-acetaminophen. Additionally, I am having him start on predniSONE. Lastly, I am having him maintain his Methylsulfonylmethane (MSM PO), Melatonin, multivitamin, acetaminophen, vitamin B-12, DiphenhydrAMINE HCl (DIPHEDRYL ALLERGY PO), Potassium, desloratadine-pseudoephedrine, Calcium Carbonate (CALCIUM 600 PO), ibuprofen, fluticasone, Glucosamine-Chondroit-Vit C-Mn (GLUCOSAMINE 1500 COMPLEX PO), magnesium oxide, Vitamin D3, fenofibrate, DULoxetine, atorvastatin, omeprazole, hydrOXYzine, ondansetron, baclofen, busPIRone, and atenolol.  Pharmacotherapy (Medications Ordered): Meds ordered this encounter  Medications  . HYDROcodone-acetaminophen (NORCO/VICODIN) 5-325 MG tablet    Sig: Take 1 tablet by mouth daily as needed for up to 30 days for severe pain. Must last 30 days.    Dispense:  30 tablet    Refill:  0    [place] STOP ACT - Not applicable to Chronic Pain Syndrome (G89.4) diagnosis. Fill one day early if pharmacy is closed on scheduled refill date. Do not fill until: 05/06/19. To last until: 06/05/19.  Marland Kitchen HYDROcodone-acetaminophen (NORCO/VICODIN) 5-325 MG tablet    Sig: Take 1 tablet by mouth daily as needed for up to 30 days for severe pain. Must last 30 days.    Dispense:  30 tablet    Refill:  0    [place] STOP ACT - Not applicable to Chronic Pain Syndrome (G89.4) diagnosis. Fill one day early if pharmacy is closed on scheduled refill date. Do not  fill until: 04/06/19. To last until: 05/06/19.  Marland Kitchen HYDROcodone-acetaminophen (NORCO/VICODIN) 5-325 MG tablet    Sig: Take 1 tablet by mouth daily as needed for up to 30 days for severe pain. Must last 30 days.    Dispense:  30 tablet    Refill:  0    [place] STOP ACT - Not applicable to Chronic Pain Syndrome (G89.4) diagnosis. Fill one day early if pharmacy is closed on scheduled refill date. Do not fill until: 03/07/19. To last until:  04/06/19.  Marland Kitchen predniSONE (DELTASONE) 20 MG tablet    Sig: Take 3 tablets (60 mg total) by mouth daily with breakfast for 3 days, THEN 2 tablets (40 mg total) daily with breakfast for 3 days, THEN 1 tablet (20 mg total) daily with breakfast for 3 days.    Dispense:  18 tablet    Refill:  0   Orders:  No orders of the defined types were placed in this encounter.  Follow-up plan:   Return in about 3 weeks (around 03/28/2019) for F/U, (Virtual Visit).  As soon as the COVID-19 restrictions are lifted, we plan to continue with the original treatment plan of doing a second right-sided lumbar facet block and if the results are similar to the first 1, but he continues not to get long-term benefit, then we will consider RFA.  Today I have provided the patient with a prescription for a 9-day steroid taper and I have increased his available hydrocodone to 1 tablet daily #30/mo.   Considering:   Diagnostic bilateral lumbar facetblock  Possible bilateral lumbar facet RFA Palliative left-sided cervical epidural steroid injection#3 Diagnostic bilateral cervical facetblock  Possible bilateral cervical facet RFA Diagnostic bilateral sacroiliac joint block Possible bilateral sacroiliac joint RFA Diagnostic trigger point injections   Palliative PRN treatment(s):   Diagnostic left L4-5 interlaminar LESI #2 Palliative left cervical epidural steroid injection #4   I discussed the assessment and treatment plan with the patient. The patient was provided an opportunity to ask questions and all were answered. The patient agreed with the plan and demonstrated an understanding of the instructions.  Patient advised to call back or seek an in-person evaluation if the symptoms or condition worsens.  Total duration of non-face-to-face encounter: 12 minutes.  Note by: Gaspar Cola, MD Date: 03/07/2019; Time: 8:37 AM  Disclaimer:  * Given the special circumstances of the COVID-19 pandemic, the federal  government has announced that the Office for Civil Rights (OCR) will exercise its enforcement discretion and will not impose penalties on physicians using telehealth in the event of noncompliance with regulatory requirements under the [place] and Accountability Act (HIPAA) in connection with the good faith provision of telehealth during the ZOXWR-60 national public health emergency. ([place])

## 2019-03-06 ENCOUNTER — Encounter: Payer: Self-pay | Admitting: Pain Medicine

## 2019-03-07 ENCOUNTER — Other Ambulatory Visit: Payer: Self-pay

## 2019-03-07 ENCOUNTER — Ambulatory Visit: Payer: 59 | Attending: Nurse Practitioner | Admitting: Pain Medicine

## 2019-03-07 ENCOUNTER — Telehealth: Payer: Self-pay | Admitting: *Deleted

## 2019-03-07 DIAGNOSIS — M79604 Pain in right leg: Secondary | ICD-10-CM

## 2019-03-07 DIAGNOSIS — G894 Chronic pain syndrome: Secondary | ICD-10-CM

## 2019-03-07 DIAGNOSIS — M4697 Unspecified inflammatory spondylopathy, lumbosacral region: Secondary | ICD-10-CM | POA: Diagnosis not present

## 2019-03-07 DIAGNOSIS — M545 Low back pain: Secondary | ICD-10-CM | POA: Diagnosis not present

## 2019-03-07 DIAGNOSIS — G8929 Other chronic pain: Secondary | ICD-10-CM | POA: Insufficient documentation

## 2019-03-07 MED ORDER — HYDROCODONE-ACETAMINOPHEN 5-325 MG PO TABS: 1.0000 | ORAL_TABLET | Freq: Every day | ORAL | 0 refills | Status: DC | PRN

## 2019-03-07 MED ORDER — PREDNISONE 20 MG PO TABS: ORAL_TABLET | ORAL | 0 refills | Status: DC

## 2019-03-24 ENCOUNTER — Other Ambulatory Visit: Payer: Self-pay | Admitting: Family Medicine

## 2019-03-24 NOTE — Telephone Encounter (Signed)
Requested Prescriptions  Pending Prescriptions Disp Refills  . hydrOXYzine (ATARAX/VISTARIL) 25 MG tablet [Pharmacy Med Name: HYDROXYZINE HCL 25 MG TABLET] 90 tablet 1    Sig: TAKE 1 TABLET BY MOUTH THREE TIMES A DAY AS NEEDED     Ear, Nose, and Throat:  Antihistamines Passed - 03/24/2019 12:55 PM      Passed - Valid encounter within last 12 months    Recent Outpatient Visits          5 months ago Essential hypertension, benign   [facility] [name] [place], [place]   11 months ago Healthcare maintenance   [place], NP   1 year ago Essential hypertension, benign   [facility] Kathrine Haddock, NP   1 year ago Upper respiratory tract infection, unspecified type   [facility], [name], [place]   1 year ago Essential hypertension, benign   [facility] Kathrine Haddock, NP      Future Appointments            In 3 weeks Orene Desanctis, [name], PA-C [facility], [place]

## 2019-03-27 ENCOUNTER — Telehealth: Payer: Self-pay | Admitting: *Deleted

## 2019-03-27 NOTE — Telephone Encounter (Signed)
Voicemail left with patient to please return the call prior to appt on 03/28/19 @ 1015 so that we could go over his medications.

## 2019-03-27 NOTE — Progress Notes (Signed)
Pain Management Virtual Encounter Note - Virtual Visit via Telephone Telehealth (real-time audio visits between healthcare provider and patient).  Patient's Phone No. & Preferred Pharmacy:  [phone] (home); There is no such number on file (mobile).; (Preferred) [phone] [email or web address]  [id number] [address], [place] [address] Phone: [phone] Fax: [phone]   Pre-screening note:  Our staff contacted [name] and offered him an "in person", "face-to-face" appointment versus a telephone encounter. He indicated preferring the telephone encounter, at this time.  Reason for Virtual Visit: COVID-19*  Social distancing based on CDC and AMA recommendations.   I attempted to contact [name] on 03/28/2019 at 11:46 AM via telephone. I was transferred to an answering service where I left a message to reschedule the call.  Note by: Gaspar Cola, MD Date: 03/28/2019; Time: 11:46 AM

## 2019-03-28 ENCOUNTER — Telehealth: Payer: Self-pay | Admitting: *Deleted

## 2019-03-28 ENCOUNTER — Other Ambulatory Visit: Payer: Self-pay

## 2019-03-28 ENCOUNTER — Ambulatory Visit: Payer: 59 | Admitting: Pain Medicine

## 2019-03-28 NOTE — Telephone Encounter (Signed)
2nd attempt to reach patient re; VV today 03/28/19. Voicemail left.

## 2019-04-04 ENCOUNTER — Ambulatory Visit: Payer: 59 | Attending: Pain Medicine | Admitting: Pain Medicine

## 2019-04-04 ENCOUNTER — Other Ambulatory Visit: Payer: Self-pay

## 2019-04-04 DIAGNOSIS — G894 Chronic pain syndrome: Secondary | ICD-10-CM

## 2019-04-04 DIAGNOSIS — Z01818 Encounter for other preprocedural examination: Secondary | ICD-10-CM | POA: Diagnosis not present

## 2019-04-04 DIAGNOSIS — M545 Low back pain: Secondary | ICD-10-CM

## 2019-04-04 DIAGNOSIS — G8929 Other chronic pain: Secondary | ICD-10-CM

## 2019-04-04 DIAGNOSIS — M47816 Spondylosis without myelopathy or radiculopathy, lumbar region: Secondary | ICD-10-CM

## 2019-04-04 DIAGNOSIS — M4310 Spondylolisthesis, site unspecified: Secondary | ICD-10-CM | POA: Diagnosis not present

## 2019-04-04 MED ORDER — HYDROCODONE-ACETAMINOPHEN 5-325 MG PO TABS: 1.0000 | ORAL_TABLET | Freq: Every day | ORAL | 0 refills | Status: DC | PRN

## 2019-04-04 NOTE — Progress Notes (Signed)
Pain Management Virtual Encounter Note - Virtual Visit via Telephone Telehealth (real-time audio visits between healthcare provider and patient).  Patient's Phone No. & Preferred Pharmacy:  [phone] (home); There is no such number on file (mobile).; (Preferred) [phone] [email or web address]  [id number] [address], [place] [address] Phone: [phone] Fax: [phone]   Pre-screening note:  Our staff contacted Mr. Conant and offered him an "in person", "face-to-face" appointment versus a telephone encounter. He indicated preferring the telephone encounter, at this time.  Reason for Virtual Visit: COVID-19*  Social distancing based on CDC and AMA recommendations.   I contacted [name] on 04/04/2019 at 10:50 AM via telephone.      I clearly identified myself as Gaspar Cola, MD. I verified that I was speaking with the correct person using two identifiers (Name and date of birth: Aug 01, 1966).  Advanced Informed Consent I sought verbal advanced consent from [name] for virtual visit interactions. I informed Mr. Laventure of possible security and privacy concerns, risks, and limitations associated with providing "not-in-person" medical evaluation and management services. I also informed Mr. Lanz of the availability of "in-person" appointments. Finally, I informed him that there would be a charge for the virtual visit and that he could be  personally, fully or partially, financially responsible for it. Mr. Dahan expressed understanding and agreed to proceed.   Historic Elements   Mr. HENRI BAUMLER is a 53 y.o. year old, male patient evaluated today after his last encounter by our practice on 03/28/2019. Mr. Bither  has a past medical history of Allergy, Anxiety, Chronic duodenal ulcer with hemorrhage (2012), Chronic neck pain, Depression, Hyperlipidemia, Hypertension, and Microscopic hematuria. He also   has a past surgical history that includes eye muscle repair (1972 and 1975) and bLEEDING ULCER (2012). Mr. Savant has a current medication list which includes the following prescription(s): hydrocodone-acetaminophen, hydrocodone-acetaminophen, hydrocodone-acetaminophen, acetaminophen, atenolol, atorvastatin, baclofen, buspirone, calcium carbonate, vitamin d3, desloratadine-pseudoephedrine, diphenhydramine hcl, duloxetine, fenofibrate, fluticasone, glucosamine-chondroit-vit c-mn, hydroxyzine, ibuprofen, magnesium oxide, melatonin, methylsulfonylmethane, multivitamin, omeprazole, ondansetron, potassium, and vitamin b-12. He  reports that he has never smoked. He has never used smokeless tobacco. He reports that he does not drink alcohol or use drugs. Mr. Hardgrove has No Known Allergies.   HPI  Today, he is being contacted for  follow-up evaluation after a round of oral steroids.  It turned out that the 3 prescriptions that I had sent to the pharmacy for the hydrocodone were apparently not received by the pharmacy.  [facility] and confirmed that this was the case and therefore today I have reordered those prescriptions.  I am not sure exactly what happened since I did trace the electronic transmission and according to [facility], there were sent, and received by the pharmacy, and we had a receipt.  In any case, the patient indicates that he has been having more pain in the lower back and at this point is affecting both sides.  The steroid did help, but it is slowly returning.  He wants to proceed with a diagnostic lumbar facet block, as soon as possible.  I have explained to him the process involved in doing that including the COVID-19 testing.  He understood and accepted.  He also indicated that he has a new insurance and he will need to provide this information to [employer] so that the preapproval can be processed.  OMM, he seems to be doing well on his medications  except for the fact that he has not been able  to get his hydrocodone due to [facility].  Pharmacotherapy Assessment  Analgesic: Hydrocodone/APAP 5/325 1 tablet p.o. daily (5 mg/day of hydrocodone) MME/day: 5 mg/day.   Monitoring: Pharmacotherapy: No side-effects or adverse reactions reported. [place] PMP: PDMP reviewed during this encounter.       Compliance: No problems identified. Effectiveness: Clinically acceptable. Plan: Refer to "POC".  Pertinent Labs  Renal Function Lab Results  Component Value Date   BUN 9 10/03/2018   CREATININE 0.99 10/03/2018   BCR 9 10/03/2018   GFRAA 101 10/03/2018   GFRNONAA 87 10/03/2018   Hepatic Function Lab Results  Component Value Date   AST 31 10/03/2018   ALT 39 10/03/2018   ALBUMIN 4.5 10/03/2018   UDS Summary  Date Value Ref Range Status  07/13/2018 FINAL  Final    Comment:    ==================================================================== TOXASSURE SELECT 13 (MW) ==================================================================== Test                             Result       Flag       Units Drug Present and Declared for Prescription Verification   Hydrocodone                    29           EXPECTED   ng/mg creat   Norhydrocodone                 65           EXPECTED   ng/mg creat    Sources of hydrocodone include scheduled prescription    medications. Norhydrocodone is an expected metabolite of    hydrocodone. ==================================================================== Test                      Result    Flag   Units      Ref Range   Creatinine              309              mg/dL      >=20 ==================================================================== Declared Medications:  The flagging and interpretation on this report are based on the  following declared medications.  Unexpected results may arise from  inaccuracies in the declared medications.  **Note: The testing scope of this panel includes these medications:  Hydrocodone  (Hydrocodone-Acetaminophen)  **Note: The testing scope of this panel does not include following  reported medications:  Acetaminophen (Hydrocodone-Acetaminophen)  Acetaminophen (Tylenol)  Atenolol (Tenormin)  Atorvastatin (Lipitor)  Baclofen (Lioresal)  Buspirone (BuSpar)  Chondroitin  Chondroitin (Glucosamine-Chondroitin)  Cyanocobalamin  Desloratadine (Clarinex-D)  Duloxetine (Cymbalta)  Fenofibrate  Fluticasone (Flonase)  Glucosamine (Glucosamine-Chondroitin)  Ibuprofen  Magnesium Oxide  Melatonin  Methylsulfonylmethane  Multivitamin (MVI)  Omeprazole (Prilosec)  Ondansetron (Zofran)  Potassium  Pseudoephedrine (Clarinex-D)  Vitamin D3 ==================================================================== For clinical consultation, please call [phone]. ====================================================================    Note: Above Lab results reviewed.  Recent imaging  DG C-Arm 1-60 Min-No Report Fluoroscopy was utilized by the requesting physician.  No radiographic  interpretation.   Assessment  The primary encounter diagnosis was Chronic low back pain (Secondary source of pain) (Bilateral) (L>R). Diagnoses of Grade 1 Retrolisthesis of L3 over L4, Lumbar facet syndrome (Bilateral) (L>R), Lumbar facet hypertrophy (Multilevel) (Bilateral), Preop testing, and Chronic pain syndrome were also pertinent to this  visit.  Plan of Care  I am having Sallee Lange. Delia Chimes maintain his Methylsulfonylmethane (MSM PO), Melatonin, multivitamin, acetaminophen, vitamin B-12, DiphenhydrAMINE HCl (DIPHEDRYL ALLERGY PO), Potassium, desloratadine-pseudoephedrine, Calcium Carbonate (CALCIUM 600 PO), ibuprofen, fluticasone, Glucosamine-Chondroit-Vit C-Mn (GLUCOSAMINE 1500 COMPLEX PO), magnesium oxide, Vitamin D3, fenofibrate, DULoxetine, atorvastatin, omeprazole, ondansetron, baclofen, busPIRone, atenolol, hydrOXYzine, HYDROcodone-acetaminophen, HYDROcodone-acetaminophen, and  HYDROcodone-acetaminophen.  Pharmacotherapy (Medications Ordered): Meds ordered this encounter  Medications  . HYDROcodone-acetaminophen (NORCO/VICODIN) 5-325 MG tablet    Sig: Take 1 tablet by mouth daily as needed for up to 30 days for severe pain. Must last 30 days.    Dispense:  30 tablet    Refill:  0    Chronic Pain: STOP Act - Not applicable. Fill 1 day early if closed on scheduled refill date. Do not fill until: 05/04/2019. To last until: 06/03/2019. Instruct to avoid benzodiazepines within 8 hours of opioid.  Marland Kitchen HYDROcodone-acetaminophen (NORCO/VICODIN) 5-325 MG tablet    Sig: Take 1 tablet by mouth daily as needed for up to 30 days for severe pain. Must last 30 days.    Dispense:  30 tablet    Refill:  0    Chronic Pain: STOP Act - Not applicable. Fill 1 day early if closed on scheduled refill date. Do not fill until: 04/04/2019. To last until: 05/04/2019. Instruct to avoid benzodiazepines within 8 hours of opioid.  Marland Kitchen HYDROcodone-acetaminophen (NORCO/VICODIN) 5-325 MG tablet    Sig: Take 1 tablet by mouth daily as needed for up to 30 days for severe pain. Must last 30 days.    Dispense:  30 tablet    Refill:  0    Chronic Pain: STOP Act - Not applicable. Fill 1 day early if closed on scheduled refill date. Do not fill until: 06/03/2019. To last until: 07/03/2019. Instruct to avoid benzodiazepines within 8 hours of opioid.   Orders:  Orders Placed This Encounter  Procedures  . LUMBAR FACET(MEDIAL BRANCH NERVE BLOCK) MBNB    Standing Status:   Future    Standing Expiration Date:   05/04/2019    Scheduling Instructions:     Side: Bilateral     Level: L3-4, L4-5, & L5-S1 Facets (L2, L3, L4, L5, & S1 Medial Branch Nerves)     Sedation: With Sedation.     Timeframe: ASAA    Order Specific Question:   Where will this procedure be performed?    Answer:   [facility]  . Novel Coronavirus, NAA ([facility])    Send patient to pre-admission testing for collection. Estimated turn-around time:  72 hrs.    Standing Status:   Standing    Number of Occurrences:   12    Standing Expiration Date:   04/03/2020    Order Specific Question:   Known Exposure    Answer:   No known exposure. Pre-procedure testing.   Follow-up plan:   Return for Procedure, (w/ sedation): (B) L-FCT BLK #2.    Considering: Diagnostic bilateral lumbar facetblock  Possible bilateral lumbar facet RFA Palliative left-sided cervical epidural steroid injection#3 Diagnostic bilateral cervical facetblock  Possible bilateral cervical facet RFA Diagnostic bilateral sacroiliac joint block Possible bilateral sacroiliac joint RFA Diagnostic trigger point injections   Palliative PRN treatment(s): Diagnostic left L4-5 interlaminarLESI#2 Palliative left cervical epidural steroid injection#4   I discussed the assessment and treatment plan with the patient. The patient was provided an opportunity to ask questions and all were answered. The patient agreed with the plan and demonstrated an understanding of the instructions.  Patient  advised to call back or seek an in-person evaluation if the symptoms or condition worsens.  Total duration of non-face-to-face encounter: 20 minutes.  Note by: Gaspar Cola, MD Date: 04/04/2019; Time: 11:26 AM  Note: This dictation was prepared with Dragon dictation. Any transcriptional errors that may result from this process are unintentional.  Disclaimer:  * Given the special circumstances of the COVID-19 pandemic, the federal government has announced that the Office for Civil Rights (OCR) will exercise its enforcement discretion and will not impose penalties on physicians using telehealth in the event of noncompliance with regulatory requirements under the [place] and [place] (HIPAA) in connection with the good faith provision of telehealth during the XQKSK-81 national public health emergency. ([place])

## 2019-04-04 NOTE — Patient Instructions (Signed)

## 2019-04-06 ENCOUNTER — Other Ambulatory Visit: Payer: Self-pay | Admitting: Family Medicine

## 2019-04-16 ENCOUNTER — Other Ambulatory Visit: Payer: Self-pay | Admitting: Family Medicine

## 2019-04-17 ENCOUNTER — Encounter: Payer: 59 | Admitting: Family Medicine

## 2019-04-18 ENCOUNTER — Encounter: Payer: 59 | Admitting: Nurse Practitioner

## 2019-04-23 ENCOUNTER — Other Ambulatory Visit: Payer: Self-pay | Admitting: Family Medicine

## 2019-04-24 ENCOUNTER — Telehealth: Payer: Self-pay

## 2019-04-24 NOTE — Telephone Encounter (Signed)
His new insurance denied his procedure you ordered. He called them and appealed. They said that they didn't get the percentage of relief from his February procedure. This was due to covid and phone call visit. They also told him you could do a peer to peer and the phone number is [phone]. He wants to see if you will do this. We only have 3 days to do this.

## 2019-05-11 ENCOUNTER — Encounter: Payer: Self-pay | Admitting: Family Medicine

## 2019-05-14 NOTE — Telephone Encounter (Signed)
The time frame for which we could do the appeal is long past. I will call him and schedule a virtual visit with you to obtain any information needed for the next time a procedure is ordered. He hasn't had an appointment since 04/04/19 and he is out of one of his meds.

## 2019-05-14 NOTE — Telephone Encounter (Signed)
Please call Mr. Cheever (DOB: 02-03-1966) (MRN: [id number]) and offer to schedule:  Visit: Virtual visit Type: Evaluation  NOTE: If not interested, we'll deal with it in the next F/U appointment.

## 2019-05-15 ENCOUNTER — Encounter: Payer: Self-pay | Admitting: Pain Medicine

## 2019-05-15 NOTE — Progress Notes (Signed)
Pain Management Virtual Encounter Note - Virtual Visit via Telephone Telehealth (real-time audio visits between healthcare provider and patient).   Patient's Phone No. & Preferred Pharmacy:  [phone] (home); [phone] (mobile); (Preferred) [phone] [email or web address]  [id number] [address], [place] [address] Phone: [phone] Fax: [phone]    Pre-screening note:  Our staff contacted Jose Washington and offered him an "in person", "face-to-face" appointment versus a telephone encounter. He indicated preferring the telephone encounter, at this time.   Reason for Virtual Visit: COVID-19*  Social distancing based on CDC and AMA recommendations.   I contacted Jose Washington on 05/16/2019 via telephone.      I clearly identified myself as Gaspar Cola, MD. I verified that I was speaking with the correct person using two identifiers (Name: Jose Washington, and date of birth: November 19, 1965).  Advanced Informed Consent I sought verbal advanced consent from Jose Washington for virtual visit interactions. I informed Jose Washington of possible security and privacy concerns, risks, and limitations associated with providing "not-in-person" medical evaluation and management services. I also informed Jose Washington of the availability of "in-person" appointments. Finally, I informed him that there would be a charge for the virtual visit and that he could be  personally, fully or partially, financially responsible for it. Jose Washington expressed understanding and agreed to proceed.   Historic Elements   Jose Washington is a 53 y.o. year old, male patient evaluated today after his last encounter by our practice on 04/24/2019. Jose Washington  has a past medical history of Allergy, Anxiety, Chronic duodenal ulcer with hemorrhage (2012), Chronic neck pain, Depression, Hyperlipidemia, Hypertension, and Microscopic hematuria. He also  has  a past surgical history that includes eye muscle repair (1972 and 1975) and bLEEDING ULCER (2012). Jose Washington has a current medication list which includes the following prescription(s): acetaminophen, atenolol, atorvastatin, buspirone, calcium carbonate, vitamin d3, desloratadine-pseudoephedrine, diphenhydramine hcl, duloxetine, fenofibrate, fluticasone, glucosamine-chondroit-vit c-mn, hydrocodone-acetaminophen, hydrocodone-acetaminophen, hydroxyzine, ibuprofen, magnesium oxide, melatonin, methylsulfonylmethane, multivitamin, omeprazole, ondansetron, potassium, vitamin b-12, baclofen, hydrocodone-acetaminophen, and prednisone. He  reports that he has never smoked. He has never used smokeless tobacco. He reports that he does not drink alcohol or use drugs. Jose Washington has No Known Allergies.   HPI  Today, he is being contacted for worsening of previously known (established) problem.  The patient describes the primary pain to be that of the lower back, bilaterally, with the right side being worse than the left.  Today the patient was asked to perform certain maneuvers.  He experienced pain that was worse on the right side when compared to the left when performing hyperextension and rotation of the lumbar spine.  This is maneuver to load the lumbar facet joints.  In addition, the patient has a lumbar MRI done on 09/07/2017, which confirms that the patient has multilevel lumbar facet arthropathy with evidence of acute pathology involving the L4-5 facet on the right side.  This was again confirmed today clinically.  The patient's second worst pain is that of the right lower extremity where the pain goes down to the bottom of the foot through the back of the leg, which is usually typical of referred pain from the facet joints.  Today the patient was tested for possible radiculopathy.  He was asked to toe walk and heel walk, both of which the patient was able to perform flawlessly.  He did not experience any weakness  in the distribution of  any of the lumbar nerve roots.  Specifically he had no weakness of S1 upon toe walking.  In addition he had no weakness of L5 on heel walking.  Around 2008 the patient had a referral to [facility] physical therapy for his low back pain.  He was given a TENS unit and some exercises to do.  He indicates that it did not really help his back pain.  For the last couple years he has also been going to a chiropractor on a monthly basis and lately, due to the increase in the pain he has been going on a weekly basis.  On 12/28/2018 the patient underwent a diagnostic right-sided lumbar facet block under fluoroscopic guidance and IV sedation.  Follow-up evaluation on 01/16/2019 showed that the patient obtained 100% relief of his right lower back pain for the duration of the local anesthetic which extended for several days and then he attained 100% relief of his low back pain and leg pain that was still present on 01/16/2019 from the block that he had done on 12/28/2018.  Unfortunately, the pain has returned and he indicates having changed his insurance company and encountering some problems with getting his treatment approved because they are lacking the above information.  The long-term plan of this patient is to proceed with a diagnostic bilateral lumbar facet block under fluoroscopic guidance.  If this provides the patient with results similar to his first diagnostic lumbar facet block, this would indicate that he is a good candidate for radiofrequency ablation of the lumbar facet medial branches.  The plan is to proceed with radiofrequency ablation should we get a positive diagnostic facet block again.  This patient has already tried conservative treatment including over-the-counter medications, chiropractic manipulation, physical therapy, as well as more aggressive treatments with controlled substances.  He does extremely well when the pain is under control, but right now he fears losing his job  because of this flareup.  Today I will instruct my nursing staff to send him a letter recommending that he go on light duty for the next couple weeks until we can get him in to do these diagnostic treatments.  Pharmacotherapy Assessment  Analgesic: Hydrocodone/APAP 5/325, 1 tab PO QD (5 mg/day of hydrocodone) (has enough to last until 07/03/2019) MME/day: 5 mg/day.   Monitoring: Pharmacotherapy: No side-effects or adverse reactions reported. [place] PMP: PDMP reviewed during this encounter.       Compliance: No problems identified. Effectiveness: Clinically acceptable. Plan: Refer to "POC".  Pertinent Labs   SAFETY SCREENING Profile Lab Results  Component Value Date   HIV Non Reactive 04/10/2018   Renal Function Lab Results  Component Value Date   BUN 9 10/03/2018   CREATININE 0.99 10/03/2018   BCR 9 10/03/2018   GFRAA 101 10/03/2018   GFRNONAA 87 10/03/2018   Hepatic Function Lab Results  Component Value Date   AST 31 10/03/2018   ALT 39 10/03/2018   ALBUMIN 4.5 10/03/2018   UDS Summary  Date Value Ref Range Status  07/13/2018 FINAL  Final    Comment:    ==================================================================== TOXASSURE SELECT 13 (MW) ==================================================================== Test                             Result       Flag       Units Drug Present and Declared for Prescription Verification   Hydrocodone  29           EXPECTED   ng/mg creat   Norhydrocodone                 65           EXPECTED   ng/mg creat    Sources of hydrocodone include scheduled prescription    medications. Norhydrocodone is an expected metabolite of    hydrocodone. ==================================================================== Test                      Result    Flag   Units      Ref Range   Creatinine              309              mg/dL      >=20 ==================================================================== Declared  Medications:  The flagging and interpretation on this report are based on the  following declared medications.  Unexpected results may arise from  inaccuracies in the declared medications.  **Note: The testing scope of this panel includes these medications:  Hydrocodone (Hydrocodone-Acetaminophen)  **Note: The testing scope of this panel does not include following  reported medications:  Acetaminophen (Hydrocodone-Acetaminophen)  Acetaminophen (Tylenol)  Atenolol (Tenormin)  Atorvastatin (Lipitor)  Baclofen (Lioresal)  Buspirone (BuSpar)  Chondroitin  Chondroitin (Glucosamine-Chondroitin)  Cyanocobalamin  Desloratadine (Clarinex-D)  Duloxetine (Cymbalta)  Fenofibrate  Fluticasone (Flonase)  Glucosamine (Glucosamine-Chondroitin)  Ibuprofen  Magnesium Oxide  Melatonin  Methylsulfonylmethane  Multivitamin (MVI)  Omeprazole (Prilosec)  Ondansetron (Zofran)  Potassium  Pseudoephedrine (Clarinex-D)  Vitamin D3 ==================================================================== For clinical consultation, please call [phone]. ====================================================================    Note: Above Lab results reviewed.  Recent imaging  DG C-Arm 1-60 Min-No Report Fluoroscopy was utilized by the requesting physician.  No radiographic  interpretation.   Assessment  The primary encounter diagnosis was Chronic low back pain (Primary Area of Pain) (Bilateral) (L>R) w/o sciatica . Diagnoses of Lumbar facet syndrome (Bilateral) (L>R), Lumbar facet arthropathy (Bilateral), Lumbar facet hypertrophy (Multilevel) (Bilateral), Lumbar facet osteoarthritis, Grade 1 Retrolisthesis of L3 over L4, Chronic pain syndrome, Chronic musculoskeletal pain, Muscle spasticity, Abnormal MRI, lumbar spine, and Inflammatory spondylopathy of lumbosacral region [facility]) were also pertinent to this visit.  Plan of Care  I am having Jose Washington. Delia Chimes maintain his Methylsulfonylmethane (MSM  PO), Melatonin, multivitamin, acetaminophen, vitamin B-12, DiphenhydrAMINE HCl (DIPHEDRYL ALLERGY PO), Potassium, desloratadine-pseudoephedrine, Calcium Carbonate (CALCIUM 600 PO), ibuprofen, fluticasone, Glucosamine-Chondroit-Vit C-Mn (GLUCOSAMINE 1500 COMPLEX PO), magnesium oxide, Vitamin D3, fenofibrate, DULoxetine, atorvastatin, ondansetron, busPIRone, HYDROcodone-acetaminophen, HYDROcodone-acetaminophen, HYDROcodone-acetaminophen, omeprazole, atenolol, hydrOXYzine, baclofen, and predniSONE.  Pharmacotherapy (Medications Ordered): Meds ordered this encounter  Medications  . baclofen (LIORESAL) 10 MG tablet    Sig: Take 1 tablet (10 mg total) by mouth at bedtime.    Dispense:  90 tablet    Refill:  0    Do not place medication on "Automatic Refill". Fill one day early if pharmacy is closed on scheduled refill date.  . predniSONE (DELTASONE) 20 MG tablet    Sig: Take 3 tablets (60 mg total) by mouth daily with breakfast for 3 days, THEN 2 tablets (40 mg total) daily with breakfast for 3 days, THEN 1 tablet (20 mg total) daily with breakfast for 3 days.    Dispense:  18 tablet    Refill:  0   Orders:  Orders Placed This Encounter  Procedures  . LUMBAR FACET(MEDIAL BRANCH NERVE BLOCK) MBNB    Standing Status:   Future  Standing Expiration Date:   06/16/2019    Scheduling Instructions:     Side: Bilateral     Level: L3-4, L4-5, & L5-S1 Facets (L2, L3, L4, L5, & S1 Medial Branch Nerves)     Sedation: Patient's choice.     Timeframe: ASAA    Order Specific Question:   Where will this procedure be performed?    Answer:   [facility]   Follow-up plan:   Return in about 7 weeks (around 07/02/2019) for (VV), E/M (MM), in addition, Procedure (w/ sedation): (B) vs (R) L-FCT Blk #2, (ASAP).     Considering: Possible right-sided vs bilateral lumbar facet RFA  Diagnostic bilateral cervical facetblock  Possible bilateral cervical facet RFA  Diagnostic bilateral sacroiliac joint  block Possible bilateral sacroiliac joint RFA Diagnostic trigger point injections   Palliative PRN treatment(s): Diagnostic/therapeutic right-sided vs bilateral lumbar facetblock #2  Diagnostic/therapeutic left L4-5 interlaminarLESI#2 Palliative left CESI#4    Recent Visits Date Type Provider Dept  04/04/19 Office Visit Milinda Pointer, MD [facility]  03/07/19 Office Visit Milinda Pointer, MD [facility]  Showing recent visits within past 90 days and meeting all other requirements   Today's Visits Date Type Provider Dept  05/16/19 Office Visit Milinda Pointer, MD [facility]  Showing today's visits and meeting all other requirements   Future Appointments No visits were found meeting these conditions.  Showing future appointments within next 90 days and meeting all other requirements   I discussed the assessment and treatment plan with the patient. The patient was provided an opportunity to ask questions and all were answered. The patient agreed with the plan and demonstrated an understanding of the instructions.  Patient advised to call back or seek an in-person evaluation if the symptoms or condition worsens.  Total duration of non-face-to-face encounter: 25 minutes.  Note by: Gaspar Cola, MD Date: 05/16/2019; Time: 4:54 PM  Note: This dictation was prepared with Dragon dictation. Any transcriptional errors that may result from this process are unintentional.  Disclaimer:  * Given the special circumstances of the COVID-19 pandemic, the federal government has announced that the Office for Civil Rights (OCR) will exercise its enforcement discretion and will not impose penalties on physicians using telehealth in the event of noncompliance with regulatory requirements under the [place] and [place] (HIPAA) in connection with the good faith provision of telehealth during the LSLHT-34 national  public health emergency. ([place])

## 2019-05-16 ENCOUNTER — Ambulatory Visit: Payer: 59 | Attending: Pain Medicine | Admitting: Pain Medicine

## 2019-05-16 ENCOUNTER — Other Ambulatory Visit: Payer: Self-pay

## 2019-05-16 DIAGNOSIS — M47816 Spondylosis without myelopathy or radiculopathy, lumbar region: Secondary | ICD-10-CM | POA: Diagnosis not present

## 2019-05-16 DIAGNOSIS — R937 Abnormal findings on diagnostic imaging of other parts of musculoskeletal system: Secondary | ICD-10-CM | POA: Insufficient documentation

## 2019-05-16 DIAGNOSIS — M545 Low back pain, unspecified: Secondary | ICD-10-CM

## 2019-05-16 DIAGNOSIS — G8929 Other chronic pain: Secondary | ICD-10-CM

## 2019-05-16 DIAGNOSIS — M4310 Spondylolisthesis, site unspecified: Secondary | ICD-10-CM | POA: Diagnosis not present

## 2019-05-16 DIAGNOSIS — G894 Chronic pain syndrome: Secondary | ICD-10-CM | POA: Diagnosis not present

## 2019-05-16 DIAGNOSIS — M62838 Other muscle spasm: Secondary | ICD-10-CM | POA: Diagnosis not present

## 2019-05-16 DIAGNOSIS — M7918 Myalgia, other site: Secondary | ICD-10-CM | POA: Diagnosis not present

## 2019-05-16 DIAGNOSIS — M4697 Unspecified inflammatory spondylopathy, lumbosacral region: Secondary | ICD-10-CM | POA: Diagnosis not present

## 2019-05-16 MED ORDER — PREDNISONE 20 MG PO TABS: ORAL_TABLET | ORAL | 0 refills | Status: AC

## 2019-05-16 MED ORDER — BACLOFEN 10 MG PO TABS: 10.0000 mg | ORAL_TABLET | Freq: Every day | ORAL | 0 refills | Status: DC

## 2019-05-16 NOTE — Patient Instructions (Signed)

## 2019-05-17 ENCOUNTER — Encounter: Payer: Self-pay | Admitting: *Deleted

## 2019-05-17 ENCOUNTER — Telehealth: Payer: Self-pay | Admitting: *Deleted

## 2019-05-21 ENCOUNTER — Other Ambulatory Visit: Payer: Self-pay | Admitting: Family Medicine

## 2019-05-22 NOTE — Telephone Encounter (Signed)
Requested Prescriptions  Pending Prescriptions Disp Refills  . DULoxetine (CYMBALTA) 30 MG capsule [Pharmacy Med Name: DULOXETINE HCL DR 30 MG CAP] 60 capsule 0    Sig: TAKE 1 CAPSULE (30 MG TOTAL) BY MOUTH 2 (TWO) TIMES DAILY.     Psychiatry: Antidepressants - SNRI Failed - 05/21/2019  7:41 PM      Failed - Valid encounter within last 6 months    Recent Outpatient Visits          7 months ago Essential hypertension, benign   [facility] Volney American, [place]   1 year ago Healthcare maintenance   [place], [place]   1 year ago Essential hypertension, benign   [facility] Kathrine Haddock, NP   1 year ago Upper respiratory tract infection, unspecified type   [facility], [name], [place]   1 year ago Essential hypertension, benign   [facility] Kathrine Haddock, NP             Passed - Last BP in normal range    BP Readings from Last 1 Encounters:  01/16/19 120/82         Passed - Completed PHQ-2 or PHQ-9 in the last 360 days.      Left voicemail for patient to return call to schedule follow up appointment.

## 2019-06-07 ENCOUNTER — Other Ambulatory Visit: Payer: Self-pay

## 2019-06-07 ENCOUNTER — Ambulatory Visit (HOSPITAL_BASED_OUTPATIENT_CLINIC_OR_DEPARTMENT_OTHER): Payer: 59 | Admitting: Pain Medicine

## 2019-06-07 ENCOUNTER — Ambulatory Visit
Admission: RE | Admit: 2019-06-07 | Discharge: 2019-06-07 | Disposition: A | Discharge status: Home / Self Care | Payer: 59 | Source: Ambulatory Visit | Attending: Pain Medicine | Admitting: Pain Medicine

## 2019-06-07 ENCOUNTER — Encounter: Payer: Self-pay | Admitting: Pain Medicine

## 2019-06-07 VITALS — BP 120/103 | HR 61 | Temp 98.5°F | Resp 13 | Ht 67.0 in | Wt 180.0 lb

## 2019-06-07 DIAGNOSIS — M5137 Other intervertebral disc degeneration, lumbosacral region: Secondary | ICD-10-CM | POA: Diagnosis not present

## 2019-06-07 DIAGNOSIS — M4310 Spondylolisthesis, site unspecified: Secondary | ICD-10-CM | POA: Diagnosis not present

## 2019-06-07 DIAGNOSIS — M47817 Spondylosis without myelopathy or radiculopathy, lumbosacral region: Secondary | ICD-10-CM | POA: Insufficient documentation

## 2019-06-07 DIAGNOSIS — M47816 Spondylosis without myelopathy or radiculopathy, lumbar region: Secondary | ICD-10-CM | POA: Diagnosis not present

## 2019-06-07 MED ORDER — FENTANYL CITRATE (PF) 100 MCG/2ML IJ SOLN
25.0000 ug | INTRAMUSCULAR | Status: DC | PRN
  Administered 2019-06-07: 100 ug via INTRAVENOUS
  Filled 2019-06-07: qty 2

## 2019-06-07 MED ORDER — ROPIVACAINE HCL 2 MG/ML IJ SOLN
18.0000 mL | Freq: Once | INTRAMUSCULAR | Status: AC
  Administered 2019-06-07: 18 mL via PERINEURAL
  Filled 2019-06-07: qty 20

## 2019-06-07 MED ORDER — LACTATED RINGERS IV SOLN
1000.0000 mL | Freq: Once | INTRAVENOUS | Status: AC
  Administered 2019-06-07: 1000 mL via INTRAVENOUS

## 2019-06-07 MED ORDER — LIDOCAINE HCL 2 % IJ SOLN
20.0000 mL | Freq: Once | INTRAMUSCULAR | Status: AC
  Administered 2019-06-07: 400 mg
  Filled 2019-06-07: qty 40

## 2019-06-07 MED ORDER — TRIAMCINOLONE ACETONIDE 40 MG/ML IJ SUSP
80.0000 mg | Freq: Once | INTRAMUSCULAR | Status: AC
  Administered 2019-06-07: 80 mg
  Filled 2019-06-07: qty 2

## 2019-06-07 MED ORDER — MIDAZOLAM HCL 5 MG/5ML IJ SOLN
1.0000 mg | INTRAMUSCULAR | Status: DC | PRN
  Administered 2019-06-07: 3 mg via INTRAVENOUS
  Filled 2019-06-07: qty 5

## 2019-06-07 NOTE — Progress Notes (Signed)
Patient's Name: Jose Washington  MRN: [id number]  Referring Provider: Milinda Pointer, MD  DOB: 1966-09-11  PCP: Volney American, [place]  DOS: 06/07/2019  Note by: Gaspar Cola, MD  Service setting: Ambulatory outpatient  Specialty: Interventional Pain Management  Patient type: Established  Location: [facility] (AMB) Pain Management Facility  Visit type: Interventional Procedure   Primary Reason for Visit: Interventional Pain Management Treatment. CC: Back Pain (low)  Procedure:          Anesthesia, Analgesia, Anxiolysis:  Type: Lumbar Facet, Medial Branch Block(s) #2  Primary Purpose: Diagnostic Region: Posterolateral Lumbosacral Spine Level: L2, L3, L4, L5, & S1 Medial Branch Level(s). Injecting these levels blocks the L3-4, L4-5, and L5-S1 lumbar facet joints. Laterality: Bilateral  Type: Moderate (Conscious) Sedation combined with Local Anesthesia Indication(s): Analgesia and Anxiety Route: Intravenous (IV) IV Access: Secured Sedation: Meaningful verbal contact was maintained at all times during the procedure  Local Anesthetic: Lidocaine 1-2%  Position: Prone   Indications: 1. Lumbar facet syndrome (Bilateral) (L>R)   2. Spondylosis without myelopathy or radiculopathy, lumbosacral region   3. DDD (degenerative disc disease), lumbosacral   4. Grade 1 Retrolisthesis of L3 over L4   5. Lumbar facet osteoarthritis   6. Lumbar facet hypertrophy (Multilevel) (Bilateral)   7. Lumbar facet arthropathy (Bilateral)    Pain Score: Pre-procedure: 3 /10 Post-procedure: 0-No pain/10   Pre-op Assessment:  Jose Washington is a 53 y.o. (year old), male patient, seen today for interventional treatment. He  has a past surgical history that includes eye muscle repair (1972 and 1975) and bLEEDING ULCER (2012). Jose Washington has a current medication list which includes the following prescription(s): acetaminophen, atenolol, atorvastatin, baclofen, buspirone, calcium carbonate, vitamin d3,  desloratadine-pseudoephedrine, diphenhydramine hcl, duloxetine, fenofibrate, fluticasone, glucosamine-chondroit-vit c-mn, hydrocodone-acetaminophen, hydroxyzine, ibuprofen, magnesium oxide, melatonin, methylsulfonylmethane, multivitamin, omeprazole, ondansetron, potassium, prednisone, vitamin b-12, hydrocodone-acetaminophen, and hydrocodone-acetaminophen, and the following Facility-Administered Medications: fentanyl and midazolam. His primarily concern today is the Back Pain (low)  Initial Vital Signs:  Pulse/HCG Rate: 61ECG Heart Rate: 72 Temp: 98.6 F (37 C) Resp: 16 BP: 138/83 SpO2: 100 %  BMI: Estimated body mass index is 28.19 kg/m as calculated from the following:   Height as of this encounter: 5\' 7"  (1.702 m).   Weight as of this encounter: 180 lb (81.6 kg).  Risk Assessment: Allergies: Reviewed. He has No Known Allergies.  Allergy Precautions: None required Coagulopathies: Reviewed. None identified.  Blood-thinner therapy: None at this time Active Infection(s): Reviewed. None identified. Jose Washington is afebrile  Site Confirmation: Jose Washington was asked to confirm the procedure and laterality before marking the site Procedure checklist: Completed Consent: Before the procedure and under the influence of no sedative(s), amnesic(s), or anxiolytics, the patient was informed of the treatment options, risks and possible complications. To fulfill our ethical and legal obligations, as recommended by the American Medical Association's Code of Ethics, I have informed the patient of my clinical impression; the nature and purpose of the treatment or procedure; the risks, benefits, and possible complications of the intervention; the alternatives, including doing nothing; the risk(s) and benefit(s) of the alternative treatment(s) or procedure(s); and the risk(s) and benefit(s) of doing nothing. The patient was provided information about the general risks and possible complications associated with  the procedure. These may include, but are not limited to: failure to achieve desired goals, infection, bleeding, organ or nerve damage, allergic reactions, paralysis, and death. In addition, the patient was informed of those risks and complications associated to [facility]  procedures, such as failure to decrease pain; infection (i.e.: Meningitis, epidural or intraspinal abscess); bleeding (i.e.: epidural hematoma, subarachnoid hemorrhage, or any other type of intraspinal or peri-dural bleeding); organ or nerve damage (i.e.: Any type of peripheral nerve, nerve root, or spinal cord injury) with subsequent damage to sensory, motor, and/or autonomic systems, resulting in permanent pain, numbness, and/or weakness of one or several areas of the body; allergic reactions; (i.e.: anaphylactic reaction); and/or death. Furthermore, the patient was informed of those risks and complications associated with the medications. These include, but are not limited to: allergic reactions (i.e.: anaphylactic or anaphylactoid reaction(s)); adrenal axis suppression; blood sugar elevation that in diabetics may result in ketoacidosis or comma; water retention that in patients with history of congestive heart failure may result in shortness of breath, pulmonary edema, and decompensation with resultant heart failure; weight gain; swelling or edema; medication-induced neural toxicity; particulate matter embolism and blood vessel occlusion with resultant organ, and/or nervous system infarction; and/or aseptic necrosis of one or more joints. Finally, the patient was informed that Medicine is not an exact science; therefore, there is also the possibility of unforeseen or unpredictable risks and/or possible complications that may result in a catastrophic outcome. The patient indicated having understood very clearly. We have given the patient no guarantees and we have made no promises. Enough time was given to the patient to ask questions, all  of which were answered to the patient's satisfaction. Jose Washington has indicated that he wanted to continue with the procedure. Attestation: I, the ordering provider, attest that I have discussed with the patient the benefits, risks, side-effects, alternatives, likelihood of achieving goals, and potential problems during recovery for the procedure that I have provided informed consent. Date   Time: 06/07/2019 10:11 AM  Pre-Procedure Preparation:  Monitoring: As per clinic protocol. Respiration, ETCO2, SpO2, BP, heart rate and rhythm monitor placed and checked for adequate function Safety Precautions: Patient was assessed for positional comfort and pressure points before starting the procedure. Time-out: I initiated and conducted the "Time-out" before starting the procedure, as per protocol. The patient was asked to participate by confirming the accuracy of the "Time Out" information. Verification of the correct person, site, and procedure were performed and confirmed by me, the nursing staff, and the patient. "Time-out" conducted as per Joint Commission's Universal Protocol (UP.01.01.01). Time: 1048  Description of Procedure:          Laterality: Bilateral. The procedure was performed in identical fashion on both sides. Levels:  L2, L3, L4, L5, & S1 Medial Branch Level(s) Area Prepped: Posterior Lumbosacral Region Prepping solution: DuraPrep (Iodine Povacrylex [0.7% available iodine] and Isopropyl Alcohol, 74% w/w) Safety Precautions: Aspiration looking for blood return was conducted prior to all injections. At no point did we inject any substances, as a needle was being advanced. Before injecting, the patient was told to immediately notify me if he was experiencing any new onset of "ringing in the ears, or metallic taste in the mouth". No attempts were made at seeking any paresthesias. Safe injection practices and needle disposal techniques used. Medications properly checked for expiration dates. SDV  (single dose vial) medications used. After the completion of the procedure, all disposable equipment used was discarded in the proper designated medical waste containers. Local Anesthesia: Protocol guidelines were followed. The patient was positioned over the fluoroscopy table. The area was prepped in the usual manner. The time-out was completed. The target area was identified using fluoroscopy. A 12-in long, straight, sterile hemostat was used with  fluoroscopic guidance to locate the targets for each level blocked. Once located, the skin was marked with an approved surgical skin marker. Once all sites were marked, the skin (epidermis, dermis, and hypodermis), as well as deeper tissues (fat, connective tissue and muscle) were infiltrated with a small amount of a short-acting local anesthetic, loaded on a 10cc syringe with a 25G, 1.5-in  Needle. An appropriate amount of time was allowed for local anesthetics to take effect before proceeding to the next step. Local Anesthetic: Lidocaine 2.0% The unused portion of the local anesthetic was discarded in the proper designated containers. Technical explanation of process:  L2 Medial Branch Nerve Block (MBB): The target area for the L2 medial branch is at the junction of the postero-lateral aspect of the superior articular process and the superior, posterior, and medial edge of the transverse process of L3. Under fluoroscopic guidance, a Quincke needle was inserted until contact was made with os over the superior postero-lateral aspect of the pedicular shadow (target area). After negative aspiration for blood, 0.5 mL of the nerve block solution was injected without difficulty or complication. The needle was removed intact. L3 Medial Branch Nerve Block (MBB): The target area for the L3 medial branch is at the junction of the postero-lateral aspect of the superior articular process and the superior, posterior, and medial edge of the transverse process of L4. Under  fluoroscopic guidance, a Quincke needle was inserted until contact was made with os over the superior postero-lateral aspect of the pedicular shadow (target area). After negative aspiration for blood, 0.5 mL of the nerve block solution was injected without difficulty or complication. The needle was removed intact. L4 Medial Branch Nerve Block (MBB): The target area for the L4 medial branch is at the junction of the postero-lateral aspect of the superior articular process and the superior, posterior, and medial edge of the transverse process of L5. Under fluoroscopic guidance, a Quincke needle was inserted until contact was made with os over the superior postero-lateral aspect of the pedicular shadow (target area). After negative aspiration for blood, 0.5 mL of the nerve block solution was injected without difficulty or complication. The needle was removed intact. L5 Medial Branch Nerve Block (MBB): The target area for the L5 medial branch is at the junction of the postero-lateral aspect of the superior articular process and the superior, posterior, and medial edge of the sacral ala. Under fluoroscopic guidance, a Quincke needle was inserted until contact was made with os over the superior postero-lateral aspect of the pedicular shadow (target area). After negative aspiration for blood, 0.5 mL of the nerve block solution was injected without difficulty or complication. The needle was removed intact. S1 Medial Branch Nerve Block (MBB): The target area for the S1 medial branch is at the posterior and inferior 6 o'clock position of the L5-S1 facet joint. Under fluoroscopic guidance, the Quincke needle inserted for the L5 MBB was redirected until contact was made with os over the inferior and postero aspect of the sacrum, at the 6 o' clock position under the L5-S1 facet joint (Target area). After negative aspiration for blood, 0.5 mL of the nerve block solution was injected without difficulty or complication. The  needle was removed intact.  Nerve block solution: 0.2% PF-Ropivacaine + Triamcinolone (40 mg/mL) diluted to a final concentration of 4 mg of Triamcinolone/mL of Ropivacaine The unused portion of the solution was discarded in the proper designated containers. Procedural Needles: 22-gauge, 3.5-inch, Quincke needles used for all levels.  Once the entire  procedure was completed, the treated area was cleaned, making sure to leave some of the prepping solution back to take advantage of its long term bactericidal properties.   Illustration of the posterior view of the lumbar spine and the posterior neural structures. Laminae of L2 through S1 are labeled. DPRL5, dorsal primary ramus of L5; DPRS1, dorsal primary ramus of S1; DPR3, dorsal primary ramus of L3; FJ, facet (zygapophyseal) joint L3-L4; I, inferior articular process of L4; LB1, lateral branch of dorsal primary ramus of L1; IAB, inferior articular branches from L3 medial branch (supplies L4-L5 facet joint); IBP, intermediate branch plexus; MB3, medial branch of dorsal primary ramus of L3; NR3, third lumbar nerve root; S, superior articular process of L5; SAB, superior articular branches from L4 (supplies L4-5 facet joint also); TP3, transverse process of L3.  Vitals:   06/07/19 1059 06/07/19 1109 06/07/19 1119 06/07/19 1129  BP: (!) 143/103 117/88 (!) 122/108 (!) 120/103  Pulse:      Resp: 18 16 13 13   Temp:  98.7 F (37.1 C)  98.5 F (36.9 C)  SpO2: 96% 99% 99% 99%  Weight:      Height:         Start Time: 1048 hrs. End Time: 1059 hrs.  Imaging Guidance (Spinal):          Type of Imaging Technique: Fluoroscopy Guidance (Spinal) Indication(s): Assistance in needle guidance and placement for procedures requiring needle placement in or near specific anatomical locations not easily accessible without such assistance. Exposure Time: Please see nurses notes. Contrast: None used. Fluoroscopic Guidance: I was personally present during the use  of fluoroscopy. "Tunnel Vision Technique" used to obtain the best possible view of the target area. Parallax error corrected before commencing the procedure. "Direction-depth-direction" technique used to introduce the needle under continuous pulsed fluoroscopy. Once target was reached, antero-posterior, oblique, and lateral fluoroscopic projection used confirm needle placement in all planes. Images permanently stored in EMR. Interpretation: No contrast injected. I personally interpreted the imaging intraoperatively. Adequate needle placement confirmed in multiple planes. Permanent images saved into the patient's record.  Antibiotic Prophylaxis:   Anti-infectives (From admission, onward)   None     Indication(s): None identified  Post-operative Assessment:  Post-procedure Vital Signs:  Pulse/HCG Rate: 6170 Temp: 98.5 F (36.9 C) Resp: 13 BP: (!) 120/103 SpO2: 99 %  EBL: None  Complications: No immediate post-treatment complications observed by team, or reported by patient.  Note: The patient tolerated the entire procedure well. A repeat set of vitals were taken after the procedure and the patient was kept under observation following institutional policy, for this type of procedure. Post-procedural neurological assessment was performed, showing return to baseline, prior to discharge. The patient was provided with post-procedure discharge instructions, including a section on how to identify potential problems. Should any problems arise concerning this procedure, the patient was given instructions to immediately contact us, at any time, without hesitation. In any case, we plan to contact the patient by telephone for a follow-up status report regarding this interventional procedure.  Comments:  No additional relevant information.  Plan of Care  Orders:  Orders Placed This Encounter  Procedures   LUMBAR FACET(MEDIAL BRANCH NERVE BLOCK) MBNB    Scheduling Instructions:     Side: Bilateral      Level: L3-4, L4-5, & L5-S1 Facets (L2, L3, L4, L5, & S1 Medial Branch Nerves)     Sedation: With Sedation.     Timeframe: Today    Order Specific Question:  Where will this procedure be performed?    Answer:   [facility]   DG PAIN CL[facility] C-ARM 1-60 MIN NO REPORT    Intraoperative interpretation by procedural physician at [place].    Standing Status:   Standing    Number of Occurrences:   1    Order Specific Question:   Reason for exam:    Answer:   Assistance in needle guidance and placement for procedures requiring needle placement in or near specific anatomical locations not easily accessible without such assistance.   Provider attestation of informed consent for procedure/surgical case    I, the ordering provider, attest that I have discussed with the patient the benefits, risks, side effects, alternatives, likelihood of achieving goals and potential problems during recovery for the procedure that I have provided informed consent.    Standing Status:   Standing    Number of Occurrences:   1   Informed Consent Details: Transcribe to consent form and obtain patient signature    Standing Status:   Standing    Number of Occurrences:   1    Order Specific Question:   Procedure    Answer:   Bilateral Lumbar facet block (medial branch block) under fluoroscopic guidance. (See notes for levels.)    Order Specific Question:   Surgeon    Answer:   Salaam Battershell A. Dossie Arbour, MD    Order Specific Question:   Indication/Reason    Answer:   Bilateral low back pain with or without lower extremity pain   Chronic Opioid Analgesic:  Hydrocodone/APAP 5/325, 1 tab PO QD (5 mg/day of hydrocodone) (has enough to last until 07/03/2019) MME/day: 5 mg/day.   Medications ordered for procedure: Meds ordered this encounter  Medications   lidocaine (XYLOCAINE) 2 % (with pres) injection 400 mg   lactated ringers infusion 1,000 mL   midazolam (VERSED) 5 MG/5ML injection 1-2 mg     Make sure Flumazenil is available in the pyxis when using this medication. If oversedation occurs, administer 0.2 mg IV over 15 sec. If after 45 sec no response, administer 0.2 mg again over 1 min; may repeat at 1 min intervals; not to exceed 4 doses (1 mg)   fentaNYL (SUBLIMAZE) injection 25-50 mcg    Make sure Narcan is available in the pyxis when using this medication. In the event of respiratory depression (RR< 8/min): Titrate NARCAN (naloxone) in increments of 0.1 to 0.2 mg IV at 2-3 minute intervals, until desired degree of reversal.   ropivacaine (PF) 2 mg/mL (0.2%) (NAROPIN) injection 18 mL   triamcinolone acetonide (KENALOG-40) injection 80 mg   Medications administered: We administered lidocaine, lactated ringers, midazolam, fentaNYL, ropivacaine (PF) 2 mg/mL (0.2%), and triamcinolone acetonide.  See the medical record for exact dosing, route, and time of administration.  Follow-up plan:   Return for (VV), 2 wk PP-F/U Eval.       Considering: Possible right-sided vs bilateral lumbar facet RFA  Diagnostic bilateral cervical facetblock  Possible bilateral cervical facet RFA  Diagnostic bilateral sacroiliac joint block Possible bilateral sacroiliac joint RFA Diagnostic trigger point injections   Palliative PRN treatment(s): Diagnostic/therapeutic right-sided vs bilateral lumbar facetblock #2  Diagnostic/therapeutic left L4-5 interlaminarLESI#2 Palliative left CESI#4     Recent Visits Date Type Provider Dept  05/16/19 Office Visit Milinda Pointer, MD [facility]  04/04/19 Office Visit Milinda Pointer, MD [facility]  Showing recent visits within past 90 days and meeting all other requirements   Today's Visits Date  Type Provider Dept  06/07/19 Procedure visit Milinda Pointer, MD [facility]  Showing today's visits and meeting all other requirements   Future Appointments Date Type Provider Dept  07/05/19 Appointment  Milinda Pointer, MD [facility]  Showing future appointments within next 90 days and meeting all other requirements   Disposition: Discharge home  Discharge Date & Time: 06/07/2019; 1129 hrs.   Primary Care Physician: Volney American, PA-C Location: [facility] Note by: Gaspar Cola, MD Date: 06/07/2019; Time: 12:24 PM  Disclaimer:  Medicine is not an Chief Strategy Officer. The only guarantee in medicine is that nothing is guaranteed. It is important to note that the decision to proceed with this intervention was based on the information collected from the patient. The Data and conclusions were drawn from the patient's questionnaire, the interview, and the physical examination. Because the information was provided in large part by the patient, it cannot be guaranteed that it has not been purposely or unconsciously manipulated. Every effort has been made to obtain as much relevant data as possible for this evaluation. It is important to note that the conclusions that lead to this procedure are derived in large part from the available data. Always take into account that the treatment will also be dependent on availability of resources and existing treatment guidelines, considered by other Pain Management Practitioners as being common knowledge and practice, at the time of the intervention. For Medico-Legal purposes, it is also important to point out that variation in procedural techniques and pharmacological choices are the acceptable norm. The indications, contraindications, technique, and results of the above procedure should only be interpreted and judged by a Board-Certified Interventional Pain Specialist with extensive familiarity and expertise in the same exact procedure and technique.

## 2019-06-07 NOTE — Patient Instructions (Signed)

## 2019-06-07 NOTE — Progress Notes (Signed)
Safety precautions to be maintained throughout the outpatient stay will include: orient to surroundings, keep bed in low position, maintain call bell within reach at all times, provide assistance with transfer out of bed and ambulation.  

## 2019-06-08 ENCOUNTER — Telehealth: Payer: Self-pay

## 2019-06-08 NOTE — Telephone Encounter (Signed)
No answer, left message on AM to call if needed.

## 2019-06-10 ENCOUNTER — Other Ambulatory Visit: Payer: Self-pay | Admitting: Pain Medicine

## 2019-06-10 DIAGNOSIS — M4697 Unspecified inflammatory spondylopathy, lumbosacral region: Secondary | ICD-10-CM

## 2019-06-10 DIAGNOSIS — M47816 Spondylosis without myelopathy or radiculopathy, lumbar region: Secondary | ICD-10-CM

## 2019-06-15 ENCOUNTER — Other Ambulatory Visit: Payer: Self-pay

## 2019-06-15 ENCOUNTER — Ambulatory Visit (INDEPENDENT_AMBULATORY_CARE_PROVIDER_SITE_OTHER): Payer: 59 | Admitting: Family Medicine

## 2019-06-15 ENCOUNTER — Encounter: Payer: Self-pay | Admitting: Family Medicine

## 2019-06-15 VITALS — BP 136/83 | HR 68 | Temp 99.1°F

## 2019-06-15 DIAGNOSIS — R1013 Epigastric pain: Secondary | ICD-10-CM | POA: Diagnosis not present

## 2019-06-15 DIAGNOSIS — F325 Major depressive disorder, single episode, in full remission: Secondary | ICD-10-CM | POA: Diagnosis not present

## 2019-06-15 DIAGNOSIS — K2970 Gastritis, unspecified, without bleeding: Secondary | ICD-10-CM

## 2019-06-15 DIAGNOSIS — E785 Hyperlipidemia, unspecified: Secondary | ICD-10-CM | POA: Diagnosis not present

## 2019-06-15 DIAGNOSIS — F419 Anxiety disorder, unspecified: Secondary | ICD-10-CM

## 2019-06-15 DIAGNOSIS — I10 Essential (primary) hypertension: Secondary | ICD-10-CM

## 2019-06-15 DIAGNOSIS — Z23 Encounter for immunization: Secondary | ICD-10-CM

## 2019-06-15 DIAGNOSIS — R69 Illness, unspecified: Secondary | ICD-10-CM | POA: Diagnosis not present

## 2019-06-15 MED ORDER — FENOFIBRATE 160 MG PO TABS: 160.0000 mg | ORAL_TABLET | Freq: Every day | ORAL | 1 refills | Status: DC

## 2019-06-15 MED ORDER — ALBUTEROL SULFATE HFA 108 (90 BASE) MCG/ACT IN AERS: 2.0000 | INHALATION_SPRAY | Freq: Four times a day (QID) | RESPIRATORY_TRACT | 0 refills | Status: DC | PRN

## 2019-06-15 MED ORDER — ATORVASTATIN CALCIUM 40 MG PO TABS: 40.0000 mg | ORAL_TABLET | Freq: Every day | ORAL | 1 refills | Status: DC

## 2019-06-15 MED ORDER — DULOXETINE HCL 30 MG PO CPEP: 30.0000 mg | ORAL_CAPSULE | Freq: Two times a day (BID) | ORAL | 1 refills | Status: DC

## 2019-06-15 MED ORDER — SUCRALFATE 1 G PO TABS: 1.0000 g | ORAL_TABLET | Freq: Three times a day (TID) | ORAL | 0 refills | Status: DC

## 2019-06-15 MED ORDER — BUSPIRONE HCL 30 MG PO TABS: 30.0000 mg | ORAL_TABLET | Freq: Two times a day (BID) | ORAL | 1 refills | Status: DC

## 2019-06-15 MED ORDER — HYDROXYZINE HCL 25 MG PO TABS: 25.0000 mg | ORAL_TABLET | Freq: Three times a day (TID) | ORAL | 1 refills | Status: DC | PRN

## 2019-06-15 MED ORDER — ONDANSETRON HCL 8 MG PO TABS: 8.0000 mg | ORAL_TABLET | Freq: Two times a day (BID) | ORAL | 1 refills | Status: DC

## 2019-06-15 MED ORDER — ATENOLOL 50 MG PO TABS: 50.0000 mg | ORAL_TABLET | Freq: Every day | ORAL | 1 refills | Status: DC

## 2019-06-15 NOTE — Progress Notes (Signed)
BP 136/83 (BP Location: Right Arm, Cuff Size: Normal)   Pulse 68   Temp 99.1 F (37.3 C) (Oral)   SpO2 98%    Subjective:    Patient ID: [name], male    DOB: 1966-02-04, 53 y.o.   MRN: [id number]  HPI: JASON FRISBEE is a 53 y.o. male  Chief Complaint  Patient presents with  . Depression  . Hypertension  . Gas    pt states he has had some abdominal bloating and gas daily for the past 2 weeks    Here today for 6 month f/u chronic conditions.   HTN - Checks BPs at work which is typically 130s/80s. Tolerating medication well, denies CP, SOB, HAs, dizziness.   HLD - taking lipitor without issue and trying to eat well and stay active. No claudication, myalgias.   Depression/anxiety - Hydroxyzine prn seems to really help with his anxiety. Work and pandemic increasing anxiousness quite a bit but feels things are fairly well controlled.  Has been taking cymbalta since 2006 at current dose and buspar since about 2011 and this is the only regimen that's worked for him. He's tried multiple other options without relief. Denies side effects, SI/HI.   Having some bloating and abdominal pain for about 2 weeks now. Simethicone seems to help. Seems to bother him more at nighttime. Has chronic nausea and hx of gastritis with bleeding ulcer. Has been taking more ibuprofen lately due to a back injection that was delayed so his chronic back pain has been much worse. States this timeline fits his increased use.   Depression screen [facility] 2/9 06/07/2019 01/16/2019 12/28/2018  Decreased Interest 0 0 0  Down, Depressed, Hopeless 0 0 0  PHQ - 2 Score 0 0 0  Altered sleeping - 0 -  Tired, decreased energy - 0 -  Change in appetite - 0 -  Feeling bad or failure about yourself  - 0 -  Trouble concentrating - 0 -  Moving slowly or fidgety/restless - 0 -  Suicidal thoughts - 0 -  PHQ-9 Score - 0 -  Some recent data might be hidden  No flowsheet data found.    Relevant past medical,  surgical, family and social history reviewed and updated as indicated. Interim medical history since our last visit reviewed. Allergies and medications reviewed and updated.  Review of Systems  Per HPI unless specifically indicated above     Objective:    BP 136/83 (BP Location: Right Arm, Cuff Size: Normal)   Pulse 68   Temp 99.1 F (37.3 C) (Oral)   SpO2 98%   Wt Readings from Last 3 Encounters:  06/07/19 180 lb (81.6 kg)  01/16/19 185 lb (83.9 kg)  12/28/18 185 lb (83.9 kg)    Physical Exam Vitals signs and nursing note reviewed.  Constitutional:      Appearance: Normal appearance.  HENT:     Head: Atraumatic.  Eyes:     Extraocular Movements: Extraocular movements intact.     Conjunctiva/sclera: Conjunctivae normal.  Neck:     Musculoskeletal: Normal range of motion and neck supple.  Cardiovascular:     Rate and Rhythm: Normal rate and regular rhythm.  Pulmonary:     Effort: Pulmonary effort is normal.     Breath sounds: Normal breath sounds.  Abdominal:     General: Bowel sounds are normal.     Palpations: Abdomen is soft.     Tenderness: There is abdominal tenderness (mild epigastric ttp). There  is no right CVA tenderness, left CVA tenderness or guarding.  Musculoskeletal: Normal range of motion.  Skin:    General: Skin is warm and dry.  Neurological:     General: No focal deficit present.     Mental Status: He is oriented to person, place, and time.  Psychiatric:        Mood and Affect: Mood normal.        Thought Content: Thought content normal.        Judgment: Judgment normal.     Results for orders placed or performed in visit on 24/09/73  Helicobacter pylori abs-IgG+IgA, bld  Result Value Ref Range   H. pylori, IgA Abs <9.0 0.0 - 8.9 units   H. pylori, IgG AbS 0.30 0.00 - 0.79 Index Value  Lipid Panel w/o Chol/HDL Ratio  Result Value Ref Range   Cholesterol, Total 199 100 - 199 mg/dL   Triglycerides 131 0 - 149 mg/dL   HDL 50 >39 mg/dL   VLDL  Cholesterol Cal 26 5 - 40 mg/dL   LDL Calculated 123 (H) 0 - 99 mg/dL  Comprehensive metabolic panel  Result Value Ref Range   Glucose 114 (H) 65 - 99 mg/dL   BUN 13 6 - 24 mg/dL   Creatinine, Ser 0.96 0.76 - 1.27 mg/dL   GFR calc non Af Amer 90 >59 mL/min/1.73   GFR calc Af Amer 104 >59 mL/min/1.73   BUN/Creatinine Ratio 14 9 - 20   Sodium 142 134 - 144 mmol/L   Potassium 4.2 3.5 - 5.2 mmol/L   Chloride 102 96 - 106 mmol/L   CO2 26 20 - 29 mmol/L   Calcium 9.5 8.7 - 10.2 mg/dL   Total Protein 6.7 6.0 - 8.5 g/dL   Albumin 4.7 3.8 - 4.9 g/dL   Globulin, Total 2.0 1.5 - 4.5 g/dL   Albumin/Globulin Ratio 2.4 (H) 1.2 - 2.2   Bilirubin Total 0.4 0.0 - 1.2 mg/dL   Alkaline Phosphatase 56 39 - 117 IU/L   AST 23 0 - 40 IU/L   ALT 33 0 - 44 IU/L  CBC with Differential/Platelet  Result Value Ref Range   WBC 11.3 (H) 3.4 - 10.8 x10E3/uL   RBC 5.06 4.14 - 5.80 x10E6/uL   Hemoglobin 15.4 13.0 - 17.7 g/dL   Hematocrit 45.4 37.5 - 51.0 %   MCV 90 79 - 97 fL   MCH 30.4 26.6 - 33.0 pg   MCHC 33.9 31.5 - 35.7 g/dL   RDW 13.0 11.6 - 15.4 %   Platelets 409 150 - 450 x10E3/uL   Neutrophils 78 Not Estab. %   Lymphs 14 Not Estab. %   Monocytes 7 Not Estab. %   Eos 0 Not Estab. %   Basos 0 Not Estab. %   Neutrophils Absolute 8.8 (H) 1.4 - 7.0 x10E3/uL   Lymphocytes Absolute 1.6 0.7 - 3.1 x10E3/uL   Monocytes Absolute 0.8 0.1 - 0.9 x10E3/uL   EOS (ABSOLUTE) 0.1 0.0 - 0.4 x10E3/uL   Basophils Absolute 0.0 0.0 - 0.2 x10E3/uL   Immature Granulocytes 1 Not Estab. %   Immature Grans (Abs) 0.1 0.0 - 0.1 x10E3/uL      Assessment & Plan:   Problem List Items Addressed This Visit      Cardiovascular and Mediastinum   Essential hypertension, benign - Primary    BPs stable and WNL, continue current regimen      Relevant Medications   fenofibrate 160 MG tablet   atorvastatin (LIPITOR)  40 MG tablet   atenolol (TENORMIN) 50 MG tablet     Digestive   Gastritis    No evidence of active  bleeding at this time, but suspect sxs related to gastritis flare due to stress and increased NSAID use. D/c NSAIDs, carafate and PPI sent. BRAT diet. Will check H pylori labs.         Other   Hyperlipidemia    Recheck lipids, adjust as needed. Continue current regimen      Relevant Medications   fenofibrate 160 MG tablet   atorvastatin (LIPITOR) 40 MG tablet   atenolol (TENORMIN) 50 MG tablet   Other Relevant Orders   Lipid Panel w/o Chol/HDL Ratio (Completed)   Comprehensive metabolic panel (Completed)   Depression, major, single episode, complete remission (HCC)    Chronic, stable and under good control. Continue current regimen      Relevant Medications   hydrOXYzine (ATARAX/VISTARIL) 25 MG tablet   DULoxetine (CYMBALTA) 30 MG capsule   busPIRone (BUSPAR) 30 MG tablet   Anxiety    Chronic, stable and under fairly good control. Continue current regimen      Relevant Medications   hydrOXYzine (ATARAX/VISTARIL) 25 MG tablet   DULoxetine (CYMBALTA) 30 MG capsule   busPIRone (BUSPAR) 30 MG tablet    Other Visit Diagnoses    Epigastric pain       Relevant Orders   Helicobacter pylori abs-IgG+IgA, bld (Completed)   CBC with Differential/Platelet (Completed)   Need for Td vaccine       Relevant Orders   Td vaccine greater than or equal to 7yo preservative free IM (Completed)       Follow up plan: Return in about 6 months (around 12/16/2019) for CPE.

## 2019-06-15 NOTE — Patient Instructions (Addendum)
Td Vaccine (Tetanus and Diphtheria): What You Need to Know 1. Why get vaccinated? Tetanus  and diphtheria are very serious diseases. They are rare in the [place] today, but people who do become infected often have severe complications. Td vaccine is used to protect adolescents and adults from both of these diseases. Both tetanus and diphtheria are infections caused by bacteria. Diphtheria spreads from person to person through coughing or sneezing. Tetanus-causing bacteria enter the body through cuts, scratches, or wounds. TETANUS (Lockjaw) causes painful muscle tightening and stiffness, usually all over the body.  It can lead to tightening of muscles in the head and neck so you can't open your mouth, swallow, or sometimes even breathe. Tetanus kills about 1 out of every 10 people who are infected even after receiving the best medical care. DIPHTHERIA can cause a thick coating to form in the back of the throat.  It can lead to breathing problems, paralysis, heart failure, and death. Before vaccines, as many as 200,000 cases of diphtheria and hundreds of cases of tetanus were reported in the [place] each year. Since vaccination began, reports of cases for both diseases have dropped by about 99%. 2. Td vaccine Td vaccine can protect adolescents and adults from tetanus and diphtheria. Td is usually given as a booster dose every 10 years but it can also be given earlier after a severe and dirty wound or burn. Another vaccine, called Tdap, which protects against pertussis in addition to tetanus and diphtheria, is sometimes recommended instead of Td vaccine. Your doctor or the person giving you the vaccine can give you more information. Td may safely be given at the same time as other vaccines. 3. Some people should not get this vaccine  A person who has ever had a life-threatening allergic reaction after a previous dose of any tetanus or diphtheria containing vaccine, OR has a severe allergy  to any part of this vaccine, should not get Td vaccine. Tell the person giving the vaccine about any severe allergies.  Talk to your doctor if you: ? had severe pain or swelling after any vaccine containing diphtheria or tetanus, ? ever had a condition called Guillain Barr Syndrome (GBS), ? aren't feeling well on the day the shot is scheduled. 4. Risks of a vaccine reaction With any medicine, including vaccines, there is a chance of side effects. These are usually mild and go away on their own. Serious reactions are also possible but are rare. Most people who get Td vaccine do not have any problems with it. Mild Problems following Td vaccine: (Did not interfere with activities)  Pain where the shot was given (about 8 people in 10)  Redness or swelling where the shot was given (about 1 person in 4)  Mild fever (rare)  Headache (about 1 person in 4)  Tiredness (about 1 person in 4) Moderate Problems following Td vaccine: (Interfered with activities, but did not require medical attention)  Fever over 102F (rare) Severe Problems following Td vaccine: (Unable to perform usual activities; required medical attention)  Swelling, severe pain, bleeding and/or redness in the arm where the shot was given (rare). Problems that could happen after any vaccine:  People sometimes faint after a medical procedure, including vaccination. Sitting or lying down for about 15 minutes can help prevent fainting, and injuries caused by a fall. Tell your doctor if you feel dizzy, or have vision changes or ringing in the ears.  Some people get severe pain in the shoulder and have   difficulty moving the arm where a shot was given. This happens very rarely.  Any medication can cause a severe allergic reaction. Such reactions from a vaccine are very rare, estimated at fewer than 1 in a million doses, and would happen within a few minutes to a few hours after the vaccination. As with any medicine, there is a  very remote chance of a vaccine causing a serious injury or death. The safety of vaccines is always being monitored. For more information, visit: http://www.aguilar.org/ 5. What if there is a serious reaction? What should I look for?  Look for anything that concerns you, such as signs of a severe allergic reaction, very high fever, or unusual behavior. Signs of a severe allergic reaction can include hives, swelling of the face and throat, difficulty breathing, a fast heartbeat, dizziness, and weakness. These would usually start a few minutes to a few hours after the vaccination. What should I do?  If you think it is a severe allergic reaction or other emergency that can't wait, call 9-1-1 or get the person to the nearest hospital. Otherwise, call your doctor.  Afterward, the reaction should be reported to the Vaccine Adverse Event Reporting System (VAERS). Your doctor might file this report, or you can do it yourself through the VAERS web site at www.vaers.SamedayNews.es, or by calling 403-580-0766. VAERS does not give medical advice. 6. The National Vaccine Injury Compensation Program The [employer] Vaccine Injury Compensation Program (VICP) is a federal program that was created to compensate people who may have been injured by certain vaccines. Persons who believe they may have been injured by a vaccine can learn about the program and about filing a claim by calling (805)112-7446 or visiting the [place] website at GoldCloset.com.ee. There is a time limit to file a claim for compensation. 7. How can I learn more?  Ask your doctor. He or she can give you the vaccine package insert or suggest other sources of information.  Call your local or state health department.  Contact the Centers for Disease Control and Prevention (CDC): ? Call (587)125-4574 (1-800-CDC-INFO) ? Visit CDC's website at http://hunter.com/ Vaccine Information Statement Td Vaccine (02/10/16) This information is  not intended to replace advice given to you by your health care provider. Make sure you discuss any questions you have with your health care provider. Document Released: 08/15/2006 Document Revised: 06/05/2018 Document Reviewed: 06/05/2018 Elsevier Interactive Patient Education  [employer].    Serotonin Syndrome Serotonin is a chemical in your body (neurotransmitter) that helps to control several functions, such as:  Brain and nerve cell function.  Mood and emotions.  Memory.  Eating.  Sleeping.  Sexual activity.  Stress response. Having too much serotonin in your body can cause serotonin syndrome. This condition can be harmful to your brain and nerve cells. This can be a life-threatening condition. What are the causes? This condition may be caused by taking medicines or drugs that increase the level of serotonin in your body, such as:  Antidepressant medicines.  Migraine medicines.  Certain pain medicines.  Certain drugs, including ecstasy, LSD, cocaine, and amphetamines.  Over-the-counter cough or cold medicines that contain dextromethorphan.  Certain herbal supplements, including St. John's wort, ginseng, and nutmeg. This condition usually occurs when you take these medicines or drugs in combination, but it can also happen with a high dose of a single medicine or drug. What increases the risk? You are more likely to develop this condition if:  You just started taking a medicine or  drug that increases the level of serotonin in the body.  You recently increased the dose of a medicine or drug that increases the level of serotonin in the body.  You take more than one medicine or drug that increases the level of serotonin in the body. What are the signs or symptoms? Symptoms of this condition usually start within several hours of taking a medicine or drug. Symptoms may be mild or severe. Mild symptoms include:  Sweating.  Restlessness or agitation.  Muscle  twitching or stiffness.  Rapid heart rate.  Nausea and vomiting.  Diarrhea.  Headache.  Shivering or goose bumps.  Confusion. Severe symptoms include:  Irregular heartbeat.  Seizures.  Loss of consciousness.  High fever. How is this diagnosed? This condition may be diagnosed based on:  Your medical history.  A physical exam.  Your prior use of drugs and medicines.  Blood or urine tests. These may be used to rule out other causes of your symptoms. How is this treated? The treatment for this condition depends on the severity of your symptoms.  For mild cases, stopping the medicine or drug that caused your condition is usually all that is needed.  For moderate to severe cases, treatment in a hospital may be needed to prevent or manage life-threatening symptoms. This may include medicines to control your symptoms, IV fluids, interventions to support your breathing, and treatments to control your body temperature. Follow these instructions at home: Medicines   Take over-the-counter and prescription medicines only as told by your health care provider. This is important.  Check with your health care provider before you start taking any new prescriptions, over-the-counter medicines, herbs, or supplements.  Avoid combining any medicines that can cause this condition to occur. Lifestyle   Maintain a healthy lifestyle. ? Eat a healthy diet that includes plenty of vegetables, fruits, whole grains, low-fat dairy products, and lean protein. Do not eat a lot of foods that are high in fat, added sugars, or salt. ? Get the right amount and quality of sleep. Most adults need 7-9 hours of sleep each night. ? Make time to exercise, even if it is only for short periods of time. Most adults should exercise for at least 150 minutes each week. ? Do not drink alcohol. ? Do not use illegal drugs, and do not take medicines for reasons other than they are prescribed. General  instructions  Do not use any products that contain nicotine or tobacco, such as cigarettes and e-cigarettes. If you need help quitting, ask your health care provider.  Keep all follow-up visits as told by your health care provider. This is important. Contact a health care provider if:  Your symptoms do not improve or they get worse. Get help right away if you:  Have worsening confusion, severe headache, chest pain, high fever, seizures, or loss of consciousness.  Experience serious side effects of medicine, such as swelling of your face, lips, tongue, or throat.  Have serious thoughts about hurting yourself or others. These symptoms may represent a serious problem that is an emergency. Do not wait to see if the symptoms will go away. Get medical help right away. Call your local emergency services (911 in the [place] Do not drive yourself to the hospital. If you ever feel like you may hurt yourself or others, or have thoughts about taking your own life, get help right away. You can go to your nearest emergency department or call:  Your local emergency services (911 in the [place]  A suicide crisis helpline, such as the [place] at (706)028-1011. This is open 24 hours a day. Summary  Serotonin is a brain chemical that helps to regulate the nervous system. High levels of serotonin in the body can cause serotonin syndrome, which is a very dangerous condition.  This condition may be caused by taking medicines or drugs that increase the level of serotonin in your body.  Treatment depends on the severity of your symptoms. For mild cases, stopping the medicine or drug that caused your condition is usually all that is needed.  Check with your health care provider before you start taking any new prescriptions, over-the-counter medicines, herbs, or supplements. This information is not intended to replace advice given to you by your health care provider. Make sure you  discuss any questions you have with your health care provider. Document Released: 11/25/2004 Document Revised: 11/25/2017 Document Reviewed: 11/25/2017 Elsevier Patient Education  2020 [employer].

## 2019-06-19 LAB — COMPREHENSIVE METABOLIC PANEL
ALT: 33 IU/L (ref 0–44)
AST: 23 IU/L (ref 0–40)
Albumin/Globulin Ratio: 2.4 — ABNORMAL HIGH (ref 1.2–2.2)
Albumin: 4.7 g/dL (ref 3.8–4.9)
Alkaline Phosphatase: 56 IU/L (ref 39–117)
BUN/Creatinine Ratio: 14 (ref 9–20)
BUN: 13 mg/dL (ref 6–24)
Bilirubin Total: 0.4 mg/dL (ref 0.0–1.2)
CO2: 26 mmol/L (ref 20–29)
Calcium: 9.5 mg/dL (ref 8.7–10.2)
Chloride: 102 mmol/L (ref 96–106)
Creatinine, Ser: 0.96 mg/dL (ref 0.76–1.27)
GFR calc Af Amer: 104 mL/min/{1.73_m2} (ref >59)
GFR calc non Af Amer: 90 mL/min/{1.73_m2} (ref >59)
Globulin, Total: 2 g/dL (ref 1.5–4.5)
Glucose: 114 mg/dL — ABNORMAL HIGH (ref 65–99)
Potassium: 4.2 mmol/L (ref 3.5–5.2)
Sodium: 142 mmol/L (ref 134–144)
Total Protein: 6.7 g/dL (ref 6.0–8.5)

## 2019-06-19 LAB — CBC WITH DIFFERENTIAL/PLATELET
Basophils Absolute: 0 10*3/uL (ref 0.0–0.2)
Basos: 0 %
EOS (ABSOLUTE): 0.1 10*3/uL (ref 0.0–0.4)
Eos: 0 %
Hematocrit: 45.4 % (ref 37.5–51.0)
Hemoglobin: 15.4 g/dL (ref 13.0–17.7)
Immature Grans (Abs): 0.1 10*3/uL (ref 0.0–0.1)
Immature Granulocytes: 1 %
Lymphocytes Absolute: 1.6 10*3/uL (ref 0.7–3.1)
Lymphs: 14 %
MCH: 30.4 pg (ref 26.6–33.0)
MCHC: 33.9 g/dL (ref 31.5–35.7)
MCV: 90 fL (ref 79–97)
Monocytes Absolute: 0.8 10*3/uL (ref 0.1–0.9)
Monocytes: 7 %
Neutrophils Absolute: 8.8 10*3/uL — ABNORMAL HIGH (ref 1.4–7.0)
Neutrophils: 78 %
Platelets: 409 10*3/uL (ref 150–450)
RBC: 5.06 x10E6/uL (ref 4.14–5.80)
RDW: 13 % (ref 11.6–15.4)
WBC: 11.3 10*3/uL — ABNORMAL HIGH (ref 3.4–10.8)

## 2019-06-19 LAB — LIPID PANEL W/O CHOL/HDL RATIO
Cholesterol, Total: 199 mg/dL (ref 100–199)
HDL: 50 mg/dL (ref >39)
LDL Calculated: 123 mg/dL — ABNORMAL HIGH (ref 0–99)
Triglycerides: 131 mg/dL (ref 0–149)
VLDL Cholesterol Cal: 26 mg/dL (ref 5–40)

## 2019-06-19 LAB — HELICOBACTER PYLORI ABS-IGG+IGA, BLD
H. pylori, IgA Abs: <9 units (ref 0.0–8.9)
H. pylori, IgG AbS: 0.3 Index Value (ref 0.00–0.79)

## 2019-06-22 DIAGNOSIS — F419 Anxiety disorder, unspecified: Secondary | ICD-10-CM | POA: Insufficient documentation

## 2019-06-22 NOTE — Assessment & Plan Note (Signed)
Recheck lipids, adjust as needed. Continue current regimen 

## 2019-06-22 NOTE — Assessment & Plan Note (Signed)
Chronic, stable and under fairly good control. Continue current regimen

## 2019-06-22 NOTE — Assessment & Plan Note (Signed)
Chronic, stable and under good control. Continue current regimen °

## 2019-06-22 NOTE — Assessment & Plan Note (Signed)
BPs stable and WNL, continue current regimen 

## 2019-06-22 NOTE — Assessment & Plan Note (Signed)
No evidence of active bleeding at this time, but suspect sxs related to gastritis flare due to stress and increased NSAID use. D/c NSAIDs, carafate and PPI sent. BRAT diet. Will check H pylori labs.

## 2019-07-04 ENCOUNTER — Encounter: Payer: Self-pay | Admitting: Pain Medicine

## 2019-07-05 ENCOUNTER — Other Ambulatory Visit: Payer: Self-pay

## 2019-07-05 ENCOUNTER — Ambulatory Visit: Payer: 59 | Attending: Pain Medicine | Admitting: Pain Medicine

## 2019-07-05 DIAGNOSIS — G8929 Other chronic pain: Secondary | ICD-10-CM

## 2019-07-05 DIAGNOSIS — M542 Cervicalgia: Secondary | ICD-10-CM | POA: Diagnosis not present

## 2019-07-05 DIAGNOSIS — M47816 Spondylosis without myelopathy or radiculopathy, lumbar region: Secondary | ICD-10-CM | POA: Diagnosis not present

## 2019-07-05 DIAGNOSIS — M7918 Myalgia, other site: Secondary | ICD-10-CM

## 2019-07-05 DIAGNOSIS — G894 Chronic pain syndrome: Secondary | ICD-10-CM

## 2019-07-05 DIAGNOSIS — M62838 Other muscle spasm: Secondary | ICD-10-CM

## 2019-07-05 DIAGNOSIS — M545 Low back pain: Secondary | ICD-10-CM

## 2019-07-05 DIAGNOSIS — M79605 Pain in left leg: Secondary | ICD-10-CM | POA: Diagnosis not present

## 2019-07-05 MED ORDER — HYDROCODONE-ACETAMINOPHEN 5-325 MG PO TABS: 1.0000 | ORAL_TABLET | Freq: Every day | ORAL | 0 refills | Status: DC | PRN

## 2019-07-05 MED ORDER — BACLOFEN 10 MG PO TABS: 10.0000 mg | ORAL_TABLET | Freq: Every day | ORAL | 0 refills | Status: DC

## 2019-07-05 NOTE — Progress Notes (Signed)
Pain Management Virtual Encounter Note - Virtual Visit via Telephone Telehealth (real-time audio visits between healthcare provider and patient).   Patient's Phone No. & Preferred Pharmacy:  [phone] (home); [phone] (mobile); (Preferred) [phone] [email or web address]  [id number] [address], [place] [address] Phone: [phone] Fax: [phone]    Pre-screening note:  Our staff contacted Jose Washington and offered him an "in person", "face-to-face" appointment versus a telephone encounter. He indicated preferring the telephone encounter, at this time.   Reason for Virtual Visit: COVID-19*  Social distancing based on CDC and AMA recommendations.   I contacted Jose Washington on 07/05/2019 via telephone.      I clearly identified myself as Gaspar Cola, MD. I verified that I was speaking with the correct person using two identifiers (Name: Jose Washington, and date of birth: 23-Dec-1965).  Advanced Informed Consent I sought verbal advanced consent from Jose Washington for virtual visit interactions. I informed Jose Washington of possible security and privacy concerns, risks, and limitations associated with providing "not-in-person" medical evaluation and management services. I also informed Jose Washington of the availability of "in-person" appointments. Finally, I informed him that there would be a charge for the virtual visit and that he could be  personally, fully or partially, financially responsible for it. Jose Washington expressed understanding and agreed to proceed.   Historic Elements   Jose Washington is a 53 y.o. year old, male patient evaluated today after his last encounter by our practice on 06/10/2019. Jose Washington  has a past medical history of Allergy, Anxiety, Chronic duodenal ulcer with hemorrhage (2012), Chronic neck pain, Depression, Hyperlipidemia, Hypertension, and Microscopic hematuria. He also  has a  past surgical history that includes eye muscle repair (1972 and 1975) and bLEEDING ULCER (2012). Jose Washington has a current medication list which includes the following prescription(s): acetaminophen, albuterol, atenolol, atorvastatin, baclofen, buspirone, calcium carbonate, vitamin d3, desloratadine-pseudoephedrine, diphenhydramine hcl, duloxetine, fenofibrate, fluticasone, glucosamine-chondroit-vit c-mn, hydroxyzine, ibuprofen, magnesium oxide, melatonin, methylsulfonylmethane, multivitamin, omeprazole, ondansetron, potassium, simethicone, sucralfate, vitamin b-12, hydrocodone-acetaminophen, hydrocodone-acetaminophen, and hydrocodone-acetaminophen. He  reports that he has never smoked. He has never used smokeless tobacco. He reports that he does not drink alcohol or use drugs. Jose Washington has No Known Allergies.   HPI  Today, he is being contacted for medication management.  The patient indicates doing well with the current medication regimen. No adverse reactions or side effects reported to the medications.   Pharmacotherapy Assessment  Analgesic: Hydrocodone/APAP 5/325, 1 tab PO QD (5 mg/day of hydrocodone) MME/day: 5 mg/day.   Monitoring: Pharmacotherapy: No side-effects or adverse reactions reported. [place] PMP: PDMP reviewed during this encounter.       Compliance: No problems identified. Effectiveness: Clinically acceptable. Plan: Refer to "POC".  UDS:  Summary  Date Value Ref Range Status  07/13/2018 FINAL  Final    Comment:    ==================================================================== TOXASSURE SELECT 13 (MW) ==================================================================== Test                             Result       Flag       Units Drug Present and Declared for Prescription Verification   Hydrocodone                    29           EXPECTED   ng/mg creat   Norhydrocodone  65           EXPECTED   ng/mg creat    Sources of hydrocodone include scheduled  prescription    medications. Norhydrocodone is an expected metabolite of    hydrocodone. ==================================================================== Test                      Result    Flag   Units      Ref Range   Creatinine              309              mg/dL      >=20 ==================================================================== Declared Medications:  The flagging and interpretation on this report are based on the  following declared medications.  Unexpected results may arise from  inaccuracies in the declared medications.  **Note: The testing scope of this panel includes these medications:  Hydrocodone (Hydrocodone-Acetaminophen)  **Note: The testing scope of this panel does not include following  reported medications:  Acetaminophen (Hydrocodone-Acetaminophen)  Acetaminophen (Tylenol)  Atenolol (Tenormin)  Atorvastatin (Lipitor)  Baclofen (Lioresal)  Buspirone (BuSpar)  Chondroitin  Chondroitin (Glucosamine-Chondroitin)  Cyanocobalamin  Desloratadine (Clarinex-D)  Duloxetine (Cymbalta)  Fenofibrate  Fluticasone (Flonase)  Glucosamine (Glucosamine-Chondroitin)  Ibuprofen  Magnesium Oxide  Melatonin  Methylsulfonylmethane  Multivitamin (MVI)  Omeprazole (Prilosec)  Ondansetron (Zofran)  Potassium  Pseudoephedrine (Clarinex-D)  Vitamin D3 ==================================================================== For clinical consultation, please call [phone]. ====================================================================    Laboratory Chemistry Profile (12 mo)  Renal: 06/15/2019: BUN 13; BUN/Creatinine Ratio 14; Creatinine, Ser 0.96  Lab Results  Component Value Date   GFRAA 104 06/15/2019   GFRNONAA 90 06/15/2019   Hepatic: 06/15/2019: Albumin 4.7 Lab Results  Component Value Date   AST 23 06/15/2019   ALT 33 06/15/2019   Other: No results found for requested labs within last 8760 hours. Note: Above Lab results reviewed.  Imaging   Last 90 days:  Dg Pain Clinic C-arm 1-60 Min No Report  Result Date: 06/07/2019 Fluoro was used, but no Radiologist interpretation will be provided. Please refer to "NOTES" tab for provider progress note.   Assessment  The primary encounter diagnosis was Chronic pain syndrome. Diagnoses of Chronic low back pain (Primary Area of Pain) (Bilateral) (L>R) w/o sciatica , Chronic lower extremity pain (Secondary area of Pain) (Left), Chronic neck pain (Third area of Pain) (Bilateral) (L>R), Muscle spasticity, Chronic musculoskeletal pain, Lumbar facet syndrome (Bilateral) (L>R), and Lumbar facet hypertrophy (Multilevel) (Bilateral) were also pertinent to this visit.  Plan of Care  I have discontinued Jose Washington. Washington's HYDROcodone-acetaminophen and HYDROcodone-acetaminophen. I have also changed his HYDROcodone-acetaminophen. Additionally, I am having him start on HYDROcodone-acetaminophen and HYDROcodone-acetaminophen. Lastly, I am having him maintain his Methylsulfonylmethane (MSM PO), Melatonin, multivitamin, acetaminophen, vitamin B-12, DiphenhydrAMINE HCl (DIPHEDRYL ALLERGY PO), Potassium, desloratadine-pseudoephedrine, Calcium Carbonate (CALCIUM 600 PO), ibuprofen, fluticasone, Glucosamine-Chondroit-Vit C-Mn (GLUCOSAMINE 1500 COMPLEX PO), magnesium oxide, Vitamin D3, omeprazole, simethicone, ondansetron, hydrOXYzine, fenofibrate, DULoxetine, atorvastatin, atenolol, busPIRone, sucralfate, albuterol, and baclofen.  Pharmacotherapy (Medications Ordered): Meds ordered this encounter  Medications  . baclofen (LIORESAL) 10 MG tablet    Sig: Take 1 tablet (10 mg total) by mouth at bedtime.    Dispense:  90 tablet    Refill:  0    Fill one day early if pharmacy is closed on scheduled refill date. May substitute for generic if available.  Marland Kitchen HYDROcodone-acetaminophen (NORCO/VICODIN) 5-325 MG tablet    Sig: Take 1 tablet by mouth daily as needed for severe pain.  Must last 30 days.    Dispense:  30 tablet     Refill:  0    Chronic Pain: STOP Act (Not applicable) Fill 1 day early if closed on refill date. Do not fill until: 07/05/2019. To last until: 08/04/2019. Avoid benzodiazepines within 8 hours of opioids  . HYDROcodone-acetaminophen (NORCO/VICODIN) 5-325 MG tablet    Sig: Take 1 tablet by mouth daily as needed for severe pain. Must last 30 days.    Dispense:  30 tablet    Refill:  0    Chronic Pain: STOP Act (Not applicable) Fill 1 day early if closed on refill date. Do not fill until: 08/04/2019. To last until: 09/03/2019. Avoid benzodiazepines within 8 hours of opioids  . HYDROcodone-acetaminophen (NORCO/VICODIN) 5-325 MG tablet    Sig: Take 1 tablet by mouth daily as needed for severe pain. Must last 30 days.    Dispense:  30 tablet    Refill:  0    Chronic Pain: STOP Act (Not applicable) Fill 1 day early if closed on refill date. Do not fill until: 09/03/2019. To last until: 10/03/2019. Avoid benzodiazepines within 8 hours of opioids   Orders:  Orders Placed This Encounter  Procedures  . LUMBAR FACET(MEDIAL BRANCH NERVE BLOCK) MBNB    Scheduling timeframe: (PRN procedure) Jose Washington will call when needed. Clinical indication: Axial low back pain. Lumbosacral Spondylosis (M47.897).  Sedation: Usually done with sedation. (May be done without sedation if so desired by patient.) Requirements: NPO x 8 hrs.; Driver; Stop blood thinners.    Standing Status:   Standing    Number of Occurrences:   5    Standing Expiration Date:   07/04/2020    Scheduling Instructions:     Procedure: Lumbar facet block (medial branch block).     Level: L3-4, L4-5, & L5-S1 Facets (L2, L3, L4, L5, & S1 Medial Branch Nerves)     Laterality: Bilateral    Order Specific Question:   Where will this procedure be performed?    Answer:   [facility]   Follow-up plan:   Return in about 3 months (around 10/03/2019) for (VV), E/M, (MM), in addition, PRN-Procedure: (B)L-FCT BLK, (w/ Sedation).       Considering: Possible right-sided vs bilateral lumbar facet RFA  Diagnostic bilateral cervical facetblock  Possible bilateral cervical facet RFA  Diagnostic bilateral sacroiliac joint block Possible bilateral sacroiliac joint RFA Diagnostic trigger point injections   Palliative PRN treatment(s): Diagnostic/therapeutic right-sided vs bilateral lumbar facetblock #2  Diagnostic/therapeutic left L4-5 interlaminarLESI#2 Palliative left CESI#4      Recent Visits Date Type Provider Dept  06/07/19 Procedure visit Milinda Pointer, MD [facility]  05/16/19 Office Visit Milinda Pointer, MD [facility]  Showing recent visits within past 90 days and meeting all other requirements   Today's Visits Date Type Provider Dept  07/05/19 Office Visit Milinda Pointer, MD [facility]  Showing today's visits and meeting all other requirements   Future Appointments No visits were found meeting these conditions.  Showing future appointments within next 90 days and meeting all other requirements   I discussed the assessment and treatment plan with the patient. The patient was provided an opportunity to ask questions and all were answered. The patient agreed with the plan and demonstrated an understanding of the instructions.  Patient advised to call back or seek an in-person evaluation if the symptoms or condition worsens.  Total duration of non-face-to-face encounter: 13 minutes.  Note by: Kathlen Brunswick  Dossie Arbour, MD Date: 07/05/2019; Time: 3:37 PM  Note: This dictation was prepared with Dragon dictation. Any transcriptional errors that may result from this process are unintentional.  Disclaimer:  * Given the special circumstances of the COVID-19 pandemic, the federal government has announced that the Office for Civil Rights (OCR) will exercise its enforcement discretion and will not impose penalties on physicians using telehealth in the event of  noncompliance with regulatory requirements under the [place] and [place] (HIPAA) in connection with the good faith provision of telehealth during the XX123456 national public health emergency. ([place])

## 2019-07-05 NOTE — Patient Instructions (Signed)

## 2019-07-10 ENCOUNTER — Other Ambulatory Visit: Payer: Self-pay | Admitting: Family Medicine

## 2019-07-10 NOTE — Telephone Encounter (Signed)
See PEC documentation below.  

## 2019-07-10 NOTE — Telephone Encounter (Signed)
Rx sent 

## 2019-07-10 NOTE — Telephone Encounter (Signed)
Requested medication (s) are due for refill today: yes  Requested medication (s) are on the active medication list: yes  Last refill:  06/15/2019  Future visit scheduled: yes  Notes to clinic: One inhaler should last at least one month. If the patient is requesting refills earlier  Requested Prescriptions  Pending Prescriptions Disp Refills   albuterol (VENTOLIN HFA) 108 (90 Base) MCG/ACT inhaler [Pharmacy Med Name: ALBUTEROL HFA (VENTOLIN) INH]  0    Sig: TAKE 2 PUFFS BY MOUTH EVERY 6 HOURS AS NEEDED FOR WHEEZE OR SHORTNESS OF BREATH     Pulmonology:  Beta Agonists Failed - 07/10/2019  1:27 AM      Failed - One inhaler should last at least one month. If the patient is requesting refills earlier, contact the patient to check for uncontrolled symptoms.      Passed - Valid encounter within last 12 months    Recent Outpatient Visits          3 weeks ago Essential hypertension, benign   [facility] [name], [place]   9 months ago Essential hypertension, benign   [place], [name], [place]   1 year ago Healthcare maintenance   [place], NP   1 year ago Essential hypertension, benign   [facility] Kathrine Haddock, NP   1 year ago Upper respiratory tract infection, unspecified type   [facility], [name], [place]      Future Appointments            In 5 months Orene Desanctis, [name], [place], [place]

## 2019-07-15 ENCOUNTER — Other Ambulatory Visit: Payer: Self-pay | Admitting: Family Medicine

## 2019-08-06 DIAGNOSIS — Z20828 Contact with and (suspected) exposure to other viral communicable diseases: Secondary | ICD-10-CM | POA: Diagnosis not present

## 2019-08-09 ENCOUNTER — Other Ambulatory Visit: Payer: Self-pay | Admitting: Family Medicine

## 2019-08-09 NOTE — Telephone Encounter (Signed)
Forwarding medication refill request to PCP for review. 

## 2019-08-14 ENCOUNTER — Emergency Department: Payer: 59

## 2019-08-14 ENCOUNTER — Emergency Department
Admission: EM | Admit: 2019-08-14 | Discharge: 2019-08-14 | Disposition: A | Discharge status: Home / Self Care | Payer: 59 | Attending: Emergency Medicine | Admitting: Emergency Medicine

## 2019-08-14 ENCOUNTER — Encounter: Payer: Self-pay | Admitting: Intensive Care

## 2019-08-14 ENCOUNTER — Other Ambulatory Visit: Payer: Self-pay

## 2019-08-14 DIAGNOSIS — I10 Essential (primary) hypertension: Secondary | ICD-10-CM | POA: Insufficient documentation

## 2019-08-14 DIAGNOSIS — R1031 Right lower quadrant pain: Secondary | ICD-10-CM | POA: Insufficient documentation

## 2019-08-14 DIAGNOSIS — R109 Unspecified abdominal pain: Secondary | ICD-10-CM | POA: Diagnosis not present

## 2019-08-14 DIAGNOSIS — Z79899 Other long term (current) drug therapy: Secondary | ICD-10-CM | POA: Insufficient documentation

## 2019-08-14 LAB — LIPASE, BLOOD: Lipase: 29 U/L (ref 11–51)

## 2019-08-14 LAB — CBC
HCT: 44.8 % (ref 39.0–52.0)
Hemoglobin: 15.6 g/dL (ref 13.0–17.0)
MCH: 30.5 pg (ref 26.0–34.0)
MCHC: 34.8 g/dL (ref 30.0–36.0)
MCV: 87.7 fL (ref 80.0–100.0)
Platelets: 331 10*3/uL (ref 150–400)
RBC: 5.11 MIL/uL (ref 4.22–5.81)
RDW: 12.7 % (ref 11.5–15.5)
WBC: 8 10*3/uL (ref 4.0–10.5)
nRBC: 0 % (ref 0.0–0.2)

## 2019-08-14 LAB — URINALYSIS, COMPLETE (UACMP) WITH MICROSCOPIC
Bacteria, UA: NONE SEEN
Bilirubin Urine: NEGATIVE
Glucose, UA: NEGATIVE mg/dL
Hgb urine dipstick: NEGATIVE
Ketones, ur: NEGATIVE mg/dL
Leukocytes,Ua: NEGATIVE
Nitrite: NEGATIVE
Protein, ur: NEGATIVE mg/dL
Specific Gravity, Urine: 1.025 (ref 1.005–1.030)
Squamous Epithelial / HPF: NONE SEEN (ref 0–5)
pH: 6 (ref 5.0–8.0)

## 2019-08-14 LAB — COMPREHENSIVE METABOLIC PANEL
ALT: 38 U/L (ref 0–44)
AST: 33 U/L (ref 15–41)
Albumin: 4.6 g/dL (ref 3.5–5.0)
Alkaline Phosphatase: 52 U/L (ref 38–126)
Anion gap: 10 (ref 5–15)
BUN: 11 mg/dL (ref 6–20)
CO2: 26 mmol/L (ref 22–32)
Calcium: 9.7 mg/dL (ref 8.9–10.3)
Chloride: 102 mmol/L (ref 98–111)
Creatinine, Ser: 1.14 mg/dL (ref 0.61–1.24)
GFR calc Af Amer: >60 mL/min (ref >60)
GFR calc non Af Amer: >60 mL/min (ref >60)
Glucose, Bld: 108 mg/dL — ABNORMAL HIGH (ref 70–99)
Potassium: 3.8 mmol/L (ref 3.5–5.1)
Sodium: 138 mmol/L (ref 135–145)
Total Bilirubin: 0.6 mg/dL (ref 0.3–1.2)
Total Protein: 7 g/dL (ref 6.5–8.1)

## 2019-08-14 NOTE — ED Notes (Signed)
Patient transported to CT 

## 2019-08-14 NOTE — ED Provider Notes (Signed)
[facility] Emergency Department Provider Note   ____________________________________________    I have reviewed the triage vital signs and the nursing notes.   HISTORY  Chief Complaint Abdominal Pain     HPI Jose Washington is a 53 y.o. male who presents with complaints of right lower quadrant abdominal pain.  Patient reports the pain is been ongoing for about 3 days but has waxed and waned.  He notes that he has had intermittent fevers for nearly 10 days now.  Does take significant amounts of Tylenol for chronic back pain.  Did have a COVID test 10 days ago which was negative.  No nausea or vomiting.  History of a ruptured gastric ulcer, surgically repaired  Past Medical History:  Diagnosis Date  . Allergy   . Anxiety   . Chronic duodenal ulcer with hemorrhage 2012  . Chronic neck pain   . Depression   . Hyperlipidemia   . Hypertension   . Microscopic hematuria     Patient Active Problem List   Diagnosis Date Noted  . Anxiety   . Chronic musculoskeletal pain 05/16/2019  . Abnormal MRI, lumbar spine (09/07/2017) 05/16/2019  . Preop testing 04/04/2019  . Inflammatory spondylopathy of lumbosacral region ([place]) 03/07/2019  . Chronic lower extremity pain (Right) 03/07/2019  . Spondylosis without myelopathy or radiculopathy, lumbosacral region 12/28/2018  . DDD (degenerative disc disease), lumbosacral 07/25/2018  . Lumbar facet hypertrophy (Multilevel) (Bilateral) 07/25/2018  . Depression, major, single episode, complete remission ([place]) 04/10/2018  . Chronic right shoulder pain 11/30/2017  . Suprascapular neuropathy, right 11/30/2017  . Chronic knee pain (Left) 08/25/2017  . Chronic lower extremity pain (Secondary area of Pain) (Left) 08/25/2017  . Grade 1 Retrolisthesis of L3 over L4 08/25/2017  . Lumbar facet arthropathy (Bilateral) 08/25/2017  . Lumbar facet osteoarthritis 08/25/2017  . Lumbar facet syndrome (Bilateral) (L>R) 08/25/2017   . Lumbar spondylosis 08/25/2017  . Major depression in remission ([place]) 08/22/2017  . Cervical facet syndrome ([place]) (L) 03/23/2017  . Cervical spondylosis 03/23/2017  . Musculoskeletal neck pain (trapezius) (Left) 03/23/2017  . Vitamin D insufficiency 02/08/2017  . Cervical DDD (C4-5, C5-6, C6-7 and C7-T1) 01/27/2017  . Cervical foraminal stenosis (Bilateral: C5-6 & C6-7, Left: C4-5 & C7-T1) 01/27/2017  . Cervical radiculitis (Bilateral) (L>R) 01/27/2017  . Muscle spasticity 01/04/2017  . Chronic pain syndrome 11/03/2016  . Chronic low back pain (Primary Area of Pain) (Bilateral) (L>R) w/o sciatica  11/03/2016  . Chronic upper back pain (Fourth area of Pain) (Bilateral) (L>R) 11/03/2016  . Chronic shoulder pain (Bilateral) (L>R) 11/03/2016  . Osteoarthritis of AC (acromioclavicular) joint (Right) 11/03/2016  . Chronic sacroiliac joint pain (Bilateral) (L>R) 11/03/2016  . Long term current use of opiate analgesic 11/03/2016  . Long term prescription opiate use 11/03/2016  . Opiate use (5 MME/Day) 11/03/2016  . Gastritis 05/12/2016  . Chronic neck pain (Third area of Pain) (Bilateral) (L>R) 04/09/2015  . Hyperlipidemia 04/09/2015  . Essential hypertension, benign 04/09/2015    Past Surgical History:  Procedure Laterality Date  . bLEEDING ULCER  2012  . eye muscle repair  1972 and 1975    Prior to Admission medications   Medication Sig Start Date End Date Taking? Authorizing Provider  acetaminophen (TYLENOL) 500 MG tablet Take 500 mg by mouth every 6 (six) hours as needed.    [provider]  albuterol (VENTOLIN HFA) 108 (90 Base) MCG/ACT inhaler TAKE 2 PUFFS BY MOUTH EVERY 6 HOURS AS NEEDED FOR WHEEZE OR  SHORTNESS OF BREATH 07/10/19   Volney American, PA-C  atenolol (TENORMIN) 50 MG tablet Take 1 tablet (50 mg total) by mouth daily. 06/15/19   Volney American, PA-C  atorvastatin (LIPITOR) 40 MG tablet Take 1 tablet (40 mg total) by mouth daily. 06/15/19   Volney American, PA-C  baclofen (LIORESAL) 10 MG tablet Take 1 tablet (10 mg total) by mouth at bedtime. 07/05/19 10/03/19  Milinda Pointer, MD  busPIRone (BUSPAR) 30 MG tablet Take 1 tablet (30 mg total) by mouth 2 (two) times daily. 06/15/19   Volney American, PA-C  Calcium Carbonate (CALCIUM 600 PO) Take 600 mg by mouth daily.    [provider]  Cholecalciferol (VITAMIN D3) 1000 units CAPS Take 1 capsule by mouth daily.    [provider]  desloratadine-pseudoephedrine (CLARINEX-D 12-HOUR) 2.5-120 MG 12 hr tablet Take 1 tablet by mouth as needed.    [provider]  DiphenhydrAMINE HCl (DIPHEDRYL ALLERGY PO) Take 25 mg by mouth daily.    [provider]  DULoxetine (CYMBALTA) 30 MG capsule Take 1 capsule (30 mg total) by mouth 2 (two) times daily. 06/15/19   Volney American, PA-C  fenofibrate 160 MG tablet Take 1 tablet (160 mg total) by mouth daily. 06/15/19   Volney American, PA-C  fluticasone [facility]) 50 MCG/ACT nasal spray Place 2 sprays into both nostrils 2 (two) times daily.    [provider]  Glucosamine-Chondroit-Vit C-Mn (GLUCOSAMINE 1500 COMPLEX PO) Take by mouth 2 (two) times daily.    [provider]  HYDROcodone-acetaminophen (NORCO/VICODIN) 5-325 MG tablet Take 1 tablet by mouth daily as needed for severe pain. Must last 30 days. 07/05/19 08/04/19  Milinda Pointer, MD  HYDROcodone-acetaminophen (NORCO/VICODIN) 5-325 MG tablet Take 1 tablet by mouth daily as needed for severe pain. Must last 30 days. 08/04/19 09/03/19  Milinda Pointer, MD  HYDROcodone-acetaminophen (NORCO/VICODIN) 5-325 MG tablet Take 1 tablet by mouth daily as needed for severe pain. Must last 30 days. 09/03/19 10/03/19  Milinda Pointer, MD  hydrOXYzine (ATARAX/VISTARIL) 25 MG tablet Take 1 tablet (25 mg total) by mouth 3 (three) times daily as needed. 06/15/19   Volney American, PA-C  ibuprofen (ADVIL,MOTRIN) 200 MG tablet Take 200 mg  by mouth as needed (take 5 times a week).     [provider]  magnesium oxide (MAG-OX) 400 MG tablet Take 400 mg by mouth daily.    [provider]  Melatonin 5 MG TABS Take by mouth at bedtime.     [provider]  Methylsulfonylmethane (MSM PO) Take by mouth 2 (two) times daily.    [provider]  Multiple Vitamin (MULTIVITAMIN) tablet Take 1 tablet by mouth daily.    [provider]  omeprazole (PRILOSEC) 20 MG capsule TAKE 1 CAPSULE BY MOUTH EVERY DAY 04/06/19   Volney American, PA-C  ondansetron (ZOFRAN) 8 MG tablet Take 1 tablet (8 mg total) by mouth 2 (two) times daily. 06/15/19   Volney American, PA-C  Potassium 99 MG TABS Take 1 tablet by mouth.    [provider]  simethicone (MYLICON) 80 MG chewable tablet Chew 80 mg by mouth every 6 (six) hours as needed for flatulence.    [provider]  sucralfate (CARAFATE) 1 g tablet TAKE 1 TABLET (1 G TOTAL) BY MOUTH 4 (FOUR) TIMES DAILY - WITH MEALS AND AT BEDTIME. 08/09/19   Volney American, PA-C  vitamin B-12 (CYANOCOBALAMIN) 1000 MCG tablet Take 1,000 mcg  by mouth daily.    [provider]     Allergies Patient has no known allergies.  Family History  Problem Relation Age of Onset  . Diabetes Mother   . Cancer Mother        breast- in remission  . Mental illness Father   . Depression Father   . Dementia Father        VASCULAR  . Allergies Brother   . Cancer Maternal Grandfather        colon  . Stroke Paternal Grandfather   . Dementia Paternal Grandfather        vascular    Social History Social History   Tobacco Use  . Smoking status: Never Smoker  . Smokeless tobacco: Never Used  Substance Use Topics  . Alcohol use: No    Alcohol/week: 0.0 standard drinks  . Drug use: No    Review of Systems  Constitutional: As above Eyes: No visual changes.  ENT: No sore throat. Cardiovascular: Denies chest pain. Respiratory: Denies  shortness of breath. Gastrointestinal: As above Genitourinary: Negative for dysuria. Musculoskeletal: Negative for back pain. Skin: Negative for rash. Neurological: Negative for headaches    ____________________________________________   PHYSICAL EXAM:  VITAL SIGNS: ED Triage Vitals  Enc Vitals Group     BP 08/14/19 1826 (!) 151/97     Pulse Rate 08/14/19 1826 (!) 105     Resp 08/14/19 1826 19     Temp 08/14/19 1826 99.2 F (37.3 C)     Temp src --      SpO2 08/14/19 1826 97 %     Weight 08/14/19 1811 83.9 kg (185 lb)     Height 08/14/19 1811 1.702 m (5\' 7" )     Head Circumference --      Peak Flow --      Pain Score 08/14/19 1811 5     Pain Loc --      Pain Edu? --      Excl. in [place]? --     Constitutional: Alert and oriented.   Nose: No congestion/rhinnorhea. Mouth/Throat: Mucous membranes are moist.    Cardiovascular: Normal rate, regular rhythm. Grossly normal heart sounds.  Good peripheral circulation. Respiratory: Normal respiratory effort.  No retractions. Lungs CTAB. Gastrointestinal: Soft and nontender. No distention.  No CVA tenderness.  Reassuring exam  Musculoskeletal: No lower extremity tenderness nor edema.  Warm and well perfused Neurologic:  Normal speech and language. No gross focal neurologic deficits are appreciated.  Skin:  Skin is warm, dry and intact. No rash noted. Psychiatric: Mood and affect are normal. Speech and behavior are normal.  ____________________________________________   LABS (all labs ordered are listed, but only abnormal results are displayed)  Labs Reviewed  COMPREHENSIVE METABOLIC PANEL - Abnormal; Notable for the following components:      Result Value   Glucose, Bld 108 (*)    All other components within normal limits  URINALYSIS, COMPLETE (UACMP) WITH MICROSCOPIC - Abnormal; Notable for the following components:   Color, Urine YELLOW (*)    APPearance CLEAR (*)    All other components within normal limits  LIPASE,  BLOOD  CBC   ____________________________________________  EKG  None ____________________________________________  RADIOLOGY   ____________________________________________   PROCEDURES  Procedure(s) performed: No  Procedures   Critical Care performed: No ____________________________________________   INITIAL IMPRESSION / ASSESSMENT AND PLAN / ED COURSE  Pertinent labs & imaging results that were available during my care of the patient were reviewed by me  and considered in my medical decision making (see chart for details).  Patient with right lower quadrant domino pain, differential includes appendicitis, ureterolithiasis, less likely UTI, colitis.  Will check labs, obtain CT renal stone study and reevaluate.  Afebrile here in the emergency department ----------------------------------------- 8:27 PM on 08/14/2019 -----------------------------------------  CT scan is reassuring, patient is feeling better, he has no pain at this time.  Remains afebrile and well-appearing.  Recommend outpatient follow-up with PCP, return precautions discussed    ____________________________________________   FINAL CLINICAL IMPRESSION(S) / ED DIAGNOSES  Final diagnoses:  Right lower quadrant abdominal pain        Note:  This document was prepared using Dragon voice recognition software and may include unintentional dictation errors.   Lavonia Drafts, MD 08/14/19 2028

## 2019-08-14 NOTE — ED Notes (Signed)
Urine specimen sent. Ct renal completed. Patient sitting up in bed on cell phone.

## 2019-08-14 NOTE — ED Triage Notes (Addendum)
Patient c/o RLQ abd pain and low grade fever. Took otc tylenol today. Negative covid test August 06, 2019. Denies urinary symptoms

## 2019-08-23 ENCOUNTER — Telehealth: Payer: Self-pay | Admitting: Pain Medicine

## 2019-08-23 NOTE — Telephone Encounter (Signed)
Patient called asking to go ahead with prior auth for Bilateral Facet blocks, he would like to come in the week of Nov 9th if possible. Has [employer]

## 2019-09-06 ENCOUNTER — Encounter: Payer: Self-pay | Admitting: Pain Medicine

## 2019-09-09 NOTE — Progress Notes (Addendum)
Pain Management Virtual Encounter Note - Virtual Visit via Telephone Telehealth (real-time audio visits between healthcare provider and patient).   Patient's Phone No. & Preferred Pharmacy:  [phone] (home); [phone] (mobile); (Preferred) [phone] [email or web address]  [id number] [address], [place] [address] Phone: [phone] Fax: [phone]    Pre-screening note:  Our staff contacted Jose Washington and offered him an "in person", "face-to-face" appointment versus a telephone encounter. He indicated preferring the telephone encounter, at this time.   Reason for Virtual Visit: COVID-19*  Social distancing based on CDC and AMA recommendations.   I contacted Jose Washington on 09/10/2019 via telephone.      I clearly identified myself as Gaspar Cola, MD. I verified that I was speaking with the correct person using two identifiers (Name: Jose Washington, and date of birth: 1966-09-20).  Advanced Informed Consent I sought verbal advanced consent from Jose Washington for virtual visit interactions. I informed Jose Washington of possible security and privacy concerns, risks, and limitations associated with providing "not-in-person" medical evaluation and management services. I also informed Jose Washington of the availability of "in-person" appointments. Finally, I informed him that there would be a charge for the virtual visit and that he could be  personally, fully or partially, financially responsible for it. Jose Washington expressed understanding and agreed to proceed.   Historic Elements   [name] is a 53 y.o. year old, male patient evaluated today after his last encounter by our practice on 08/23/2019. Jose Washington  has a past medical history of Allergy, Anxiety, Chronic duodenal ulcer with hemorrhage (2012), Chronic neck pain, Depression, Hyperlipidemia, Hypertension, and Microscopic hematuria. He also  has  a past surgical history that includes eye muscle repair (1972 and 1975) and bLEEDING ULCER (2012). Jose Washington has a current medication list which includes the following prescription(s): acetaminophen, albuterol, atenolol, atorvastatin, baclofen, buspirone, calcium carbonate, vitamin d3, desloratadine-pseudoephedrine, diphenhydramine hcl, duloxetine, fenofibrate, fluticasone, glucosamine-chondroit-vit c-mn, hydrocodone-acetaminophen, hydrocodone-acetaminophen, hydrocodone-acetaminophen, hydroxyzine, ibuprofen, magnesium oxide, melatonin, methylsulfonylmethane, multivitamin, omeprazole, ondansetron, potassium, simethicone, sucralfate, and vitamin b-12. He  reports that he has never smoked. He has never used smokeless tobacco. He reports that he does not drink alcohol or use drugs. Jose Washington has No Known Allergies.   HPI  Today, he is being contacted for both, medication management and a post-procedure assessment.  The patient indicates doing well with the current medication regimen. No adverse reactions or side effects reported to the medications.  The patient indicates that after his a second lumbar facet block he again attained approximately 3 months of pain relief and increased range of motion on his low back pain.  Today he confirmed that with the diagnostic injection, both the leg pain and the low back pain went away confirming that the leg pain is referred from the lumbar facets.  He has continued to see his chiropractor every other week, but continues to have recurrence of his pain.  Today he indicates that the pain is beginning to get back to levels prior to his second facet injection.   Plain film x-rays done on 11/12/2016 demonstrated retrolisthesis of L3 over L4 with multilevel facet arthropathy clearly appreciated at the L4-5 and L5-S1 levels.  An MRI of the lumbar spine done on 09/07/2017 has confirmed reactive marrow edema around the right L4-5 lumbar facet joints secondary to facet  degeneration.  This can be appreciated on series 4, image 3.  The same MRI demonstrates  facet hypertrophy at the L3-4 level (series 3, image 13).  Bilateral lumbar facet arthrosis and hypertrophy with associated reactive edema on the right side (series 4, image 3).  As well as lateral facet arthrosis affecting the L5-S1 level.  In multiple locations physical exams have demonstrated positive reproduction of the pain with hyperextension and rotation maneuvers, bilaterally with the right side being worse than the left.  Diagnostic lumbar facet blocks have shown more than 75% relief of the pain on 2 different occasions on the right side and once on the left.  Unfortunately, the patient continues to experience recurrence of the pain despite the use of over-the-counter medications, prescription analgesics, physical therapy, interventional therapies, and this has been confirmed not only with physical exam, clinical history, but also by diagnostic injections.  Medical Necessity: Jose Washington has experienced debilitating chronic pain from the right Lumbosacral Facet Syndrome (Spondylosis without myelopathy or radiculopathy, lumbosacral region [M47.817]) that has persisted for longer than three months of failed non-surgical care and has either failed to respond, or was unable to tolerate, or simply did not get enough benefit from other more conservative therapies including, but not limited to: 1. Over-the-counter oral analgesic medications (i.e.: ibuprofen, naproxen, etc.) 2. Anti-inflammatory medications 3. Muscle relaxants 4. Membrane stabilizers 5. Opioids 6. Physical therapy (PT), chiropractic manipulation, and/or home exercise program (HEP). 7. Modalities (Heat, ice, etc.) 8. Invasive techniques such as nerve blocks.  Jose Washington has attained greater than 50% reduction in pain from at least two (2) diagnostic medial branch blocks on the right side, conducted in separate occasions. For this reason, I  believe it is medically necessary to proceed with a right-sided Non-Pulsed Radiofrequency Ablation for the purpose of attempting to prolong the duration of the benefits seen with the diagnostic injections.   Post-Procedure Evaluation  Procedure (06/07/2019): Diagnostic bilateral lumbar facet block (right side #2/left side #1) under fluoroscopic guidance and IV sedation Pre-procedure pain level: 3/10 Post-procedure: 0/10 (100% relief)  Sedation: Sedation provided.  Effectiveness during initial hour after procedure(Ultra-Short Term Relief):   100%.  Local anesthetic used: Long-acting (4-6 hours) Effectiveness: Defined as any analgesic benefit obtained secondary to the administration of local anesthetics. This carries significant diagnostic value as to the etiological location, or anatomical origin, of the pain. Duration of benefit is expected to coincide with the duration of the local anesthetic used.  Effectiveness during initial 4-6 hours after procedure(Short-Term Relief):   100%.  Long-term benefit: Defined as any relief past the pharmacologic duration of the local anesthetics.  Effectiveness past the initial 6 hours after procedure(Long-Term Relief):   100% x 20 days.  Current benefits: Defined as benefit that persist at this time.    Analgesia:  >75% relief x 3 months Function: Jose Washington reports improvement in function ROM: Jose Washington reports improvement in ROM   Interpretation: This confirms that the patient has a bilateral lumbar facet syndrome as he attained 100% relief of the pain, both in the lower extremities and the low back pain indicating that the lower extremity pain was referred from the facet joints (facet syndrome).   Pharmacotherapy Assessment  Analgesic: Hydrocodone/APAP 5/325, 1 tab PO QD (5 mg/day of hydrocodone) MME/day: 5 mg/day.   Monitoring: Pharmacotherapy: No side-effects or adverse reactions reported. [place] PMP: PDMP reviewed during this encounter.        Compliance: No problems identified. Effectiveness: Clinically acceptable. Plan: Refer to "POC".  UDS:  Summary  Date Value Ref Range Status  07/13/2018 FINAL  Final  Comment:    ==================================================================== TOXASSURE SELECT 13 (MW) ==================================================================== Test                             Result       Flag       Units Drug Present and Declared for Prescription Verification   Hydrocodone                    29           EXPECTED   ng/mg creat   Norhydrocodone                 65           EXPECTED   ng/mg creat    Sources of hydrocodone include scheduled prescription    medications. Norhydrocodone is an expected metabolite of    hydrocodone. ==================================================================== Test                      Result    Flag   Units      Ref Range   Creatinine              309              mg/dL      >=20 ==================================================================== Declared Medications:  The flagging and interpretation on this report are based on the  following declared medications.  Unexpected results may arise from  inaccuracies in the declared medications.  **Note: The testing scope of this panel includes these medications:  Hydrocodone (Hydrocodone-Acetaminophen)  **Note: The testing scope of this panel does not include following  reported medications:  Acetaminophen (Hydrocodone-Acetaminophen)  Acetaminophen (Tylenol)  Atenolol (Tenormin)  Atorvastatin (Lipitor)  Baclofen (Lioresal)  Buspirone (BuSpar)  Chondroitin  Chondroitin (Glucosamine-Chondroitin)  Cyanocobalamin  Desloratadine (Clarinex-D)  Duloxetine (Cymbalta)  Fenofibrate  Fluticasone (Flonase)  Glucosamine (Glucosamine-Chondroitin)  Ibuprofen  Magnesium Oxide  Melatonin  Methylsulfonylmethane  Multivitamin (MVI)  Omeprazole (Prilosec)  Ondansetron (Zofran)  Potassium   Pseudoephedrine (Clarinex-D)  Vitamin D3 ==================================================================== For clinical consultation, please call [phone]. ====================================================================    Laboratory Chemistry Profile (12 mo)  Renal: 06/15/2019: BUN/Creatinine Ratio 14 08/14/2019: BUN 11; Creatinine, Ser 1.14  Lab Results  Component Value Date   GFRAA >60 08/14/2019   GFRNONAA >60 08/14/2019   Hepatic: 08/14/2019: Albumin 4.6 Lab Results  Component Value Date   AST 33 08/14/2019   ALT 38 08/14/2019   Other: No results found for requested labs within last 8760 hours. Note: Above Lab results reviewed.  Imaging Review  Lumbosacral Imaging: Lumbar MR wo contrast:  Results for orders placed during the hospital encounter of 09/07/17  MR LUMBAR SPINE WO CONTRAST   Narrative CLINICAL DATA:  Initial evaluation for low back pain with left leg pain since 2014.  EXAM: MRI LUMBAR SPINE WITHOUT CONTRAST  TECHNIQUE: Multiplanar, multisequence MR imaging of the lumbar spine was performed. No intravenous contrast was administered.  COMPARISON:  Prior radiograph from 11/12/2016.  FINDINGS: Segmentation: Normal segmentation. Lowest well-formed disc labeled the L5-S1 level.  Alignment: Normal alignment with preservation of the normal lumbar lordosis. No listhesis.  Vertebrae: Vertebral body heights are well maintained. No evidence for acute or chronic fracture. Bone marrow signal intensity within normal limits. Mild reactive marrow edema about the right L4-5 facet due to facet degeneration (series 4, image 3).  Conus medullaris: Extends to the L1-2 level and appears normal.  Paraspinal and other soft  tissues: Paraspinous soft tissues within normal limits. Visualized visceral structures are normal.  Disc levels:  L1-2:  Unremarkable.  L2-3:  Unremarkable.  L3-4: Mild diffuse disc bulge with disc desiccation and intervertebral  disc space narrowing. Disc bulging eccentric to the left, contacting and potentially irritating the exiting left L3 nerve root as it courses of the left neural foramen (series 3, image 13). Mild facet ligamentum flavum hypertrophy. No significant canal or subarticular stenosis. Mild bilateral L3 foraminal narrowing.  L4-5: Mild diffuse disc bulge. Moderate right facet arthrosis with associated reactive edema (series 4, image 3). More mild left-sided facet arthrosis. Ligamentum flavum hypertrophy. No significant canal or subarticular stenosis. Mild to moderate bilateral L4 foraminal narrowing.  L5-S1: Normal interspace. Mild lateral facet arthrosis. No canal or neural foraminal stenosis.  IMPRESSION: 1. Left eccentric disc bulge at L3-4, contacting and potentially irritating the exiting left L3 nerve root. 2. Disc bulge with facet arthropathy at L4-5 with resultant mild to moderate bilateral L4 foraminal stenosis, slightly worse on the right. 3. Moderate facet arthropathy with associated reactive edema about the right L4-5 facet. Finding could serve as a source for lower back pain.   Electronically Signed   By: Jeannine Boga M.D.   On: 09/07/2017 14:33    Lumbar DG Bending views:  Results for orders placed during the hospital encounter of 11/12/16  DG Lumbar Spine Complete W/Bend   Narrative CLINICAL DATA:  Non radiating low back pain.  No known injury.  EXAM: LUMBAR SPINE - COMPLETE WITH BENDING VIEWS  COMPARISON:  None.  FINDINGS: Vertebral body height is maintained. Trace retrolisthesis L3 on L4 is identified. There is some loss of disc space height at L3-4. Facet arthropathy is seen at L4-5 and L5-S1. Range of motion appears limited with flexion and extension but no pathologic motion is identified.  IMPRESSION: No acute abnormality.  Mild appearing lower lumbar degenerative change.  Limited range of motion without pathologic motion.   Electronically  Signed   By: Inge Rise M.D.   On: 11/12/2016 15:29    Radiography is not sensitive for detection of early osteoarthritis of the facet joints. It can detect severe cases of facet arthrosis. Oblique views are preferred over standard AP and lateral views. Joint space narrowing, subchondral sclerosis, and osteophyte formation are common radiographic findings. These findings can be observed in this patient's oblique views.   Note: Imaging results reviewed.                    Assessment  The primary encounter diagnosis was Lumbar facet syndrome (Bilateral) (R>L). Diagnoses of Lumbar facet hypertrophy (Multilevel) (Bilateral), Lumbar facet arthropathy (Bilateral), Lumbar facet osteoarthritis, Grade 1 Retrolisthesis of L3 over L4, Chronic low back pain (Primary Area of Pain) (Bilateral) (L>R) w/o sciatica , DDD (degenerative disc disease), lumbosacral, Chronic pain syndrome, Chronic musculoskeletal pain, and Muscle spasticity were also pertinent to this visit.  Plan of Care  I am having Jose Washington. Delia Chimes start on HYDROcodone-acetaminophen and HYDROcodone-acetaminophen. I am also having him maintain his Methylsulfonylmethane (MSM PO), Melatonin, multivitamin, acetaminophen, vitamin B-12, DiphenhydrAMINE HCl (DIPHEDRYL ALLERGY PO), Potassium, desloratadine-pseudoephedrine, Calcium Carbonate (CALCIUM 600 PO), ibuprofen, fluticasone, Glucosamine-Chondroit-Vit C-Mn (GLUCOSAMINE 1500 COMPLEX PO), magnesium oxide, Vitamin D3, omeprazole, simethicone, ondansetron, hydrOXYzine, fenofibrate, DULoxetine, atorvastatin, atenolol, busPIRone, albuterol, sucralfate, baclofen, and HYDROcodone-acetaminophen.  Pharmacotherapy (Medications Ordered): Meds ordered this encounter  Medications  . baclofen (LIORESAL) 10 MG tablet    Sig: Take 1 tablet (10 mg total) by mouth at bedtime.  Dispense:  90 tablet    Refill:  1    Fill one day early if pharmacy is closed on scheduled refill date. May substitute for generic  if available.  Marland Kitchen HYDROcodone-acetaminophen (NORCO/VICODIN) 5-325 MG tablet    Sig: Take 1 tablet by mouth daily as needed for severe pain. Must last 30 days.    Dispense:  30 tablet    Refill:  0    Chronic Pain: STOP Act (Not applicable) Fill 1 day early if closed on refill date. Do not fill until: 10/03/2019. To last until: 11/02/2019. Avoid benzodiazepines within 8 hours of opioids  . HYDROcodone-acetaminophen (NORCO/VICODIN) 5-325 MG tablet    Sig: Take 1 tablet by mouth daily as needed for severe pain. Must last 30 days.    Dispense:  30 tablet    Refill:  0    Chronic Pain: STOP Act (Not applicable) Fill 1 day early if closed on refill date. Do not fill until: 11/02/2019. To last until: 12/02/2019. Avoid benzodiazepines within 8 hours of opioids  . HYDROcodone-acetaminophen (NORCO/VICODIN) 5-325 MG tablet    Sig: Take 1 tablet by mouth daily as needed for severe pain. Must last 30 days.    Dispense:  30 tablet    Refill:  0    Chronic Pain: STOP Act (Not applicable) Fill 1 day early if closed on refill date. Do not fill until: 12/02/2019. To last until: 01/01/2020. Avoid benzodiazepines within 8 hours of opioids   Orders:  Orders Placed This Encounter  Procedures  . RFA - Lumbar Facet (Schedule)    Standing Status:   Future    Standing Expiration Date:   03/09/2021    Scheduling Instructions:     Side(s): Right-sided     Level: L3-4, L4-5, & L5-S1 Facets (L2, L3, L4, L5, & S1 Medial Branch Nerves)     Sedation: With Sedation     Scheduling Timeframe: As soon as pre-approved    Order Specific Question:   Where will this procedure be performed?    Answer:   [facility]   Follow-up plan:   Return in about 16 weeks (around 12/31/2019) for (VV), (MM), in addition, RFA (w/ sedation): (R) L-FCT RFA #1, (ASAP).      Interventional treatment options:  Under consideration: Therapeutic right lumbar facet RFA #1  Possible left lumbar facet RFA  Diagnostic bilateral cervical facetblock   Possible bilateral cervical facet RFA  Diagnostic bilateral sacroiliac joint block Possible bilateral sacroiliac joint RFA Diagnostic trigger point injections   Therapeutic/palliative (PRN): Diagnostic/therapeutic right lumbar facetblock #3  Diagnostic/therapeutic left lumbar facetblock #2  Diagnostic/therapeutic left L4-5LESI#3 Palliative left CESI#4    Recent Visits Date Type Provider Dept  07/05/19 Office Visit Milinda Pointer, MD [facility]  Showing recent visits within past 90 days and meeting all other requirements   Today's Visits Date Type Provider Dept  09/10/19 Telemedicine Milinda Pointer, MD [facility]  Showing today's visits and meeting all other requirements   Future Appointments No visits were found meeting these conditions.  Showing future appointments within next 90 days and meeting all other requirements   I discussed the assessment and treatment plan with the patient. The patient was provided an opportunity to ask questions and all were answered. The patient agreed with the plan and demonstrated an understanding of the instructions.  Patient advised to call back or seek an in-person evaluation if the symptoms or condition worsens.  Total duration of non-face-to-face encounter: 20 minutes.  Note  by: Gaspar Cola, MD Date: 09/10/2019; Time: 4:25 PM  Note: This dictation was prepared with Dragon dictation. Any transcriptional errors that may result from this process are unintentional.  Disclaimer:  * Given the special circumstances of the COVID-19 pandemic, the federal government has announced that the Office for Civil Rights (OCR) will exercise its enforcement discretion and will not impose penalties on physicians using telehealth in the event of noncompliance with regulatory requirements under the [place] (HIPAA) in connection with the good faith provision of telehealth  during the XX123456 national public health emergency. ([place])

## 2019-09-10 ENCOUNTER — Ambulatory Visit: Payer: 59 | Attending: Pain Medicine | Admitting: Pain Medicine

## 2019-09-10 ENCOUNTER — Other Ambulatory Visit: Payer: Self-pay

## 2019-09-10 DIAGNOSIS — M545 Low back pain: Secondary | ICD-10-CM

## 2019-09-10 DIAGNOSIS — M7918 Myalgia, other site: Secondary | ICD-10-CM

## 2019-09-10 DIAGNOSIS — G894 Chronic pain syndrome: Secondary | ICD-10-CM

## 2019-09-10 DIAGNOSIS — M47816 Spondylosis without myelopathy or radiculopathy, lumbar region: Secondary | ICD-10-CM

## 2019-09-10 DIAGNOSIS — M51379 Other intervertebral disc degeneration, lumbosacral region without mention of lumbar back pain or lower extremity pain: Secondary | ICD-10-CM

## 2019-09-10 DIAGNOSIS — M4310 Spondylolisthesis, site unspecified: Secondary | ICD-10-CM | POA: Diagnosis not present

## 2019-09-10 DIAGNOSIS — M5137 Other intervertebral disc degeneration, lumbosacral region: Secondary | ICD-10-CM

## 2019-09-10 DIAGNOSIS — M62838 Other muscle spasm: Secondary | ICD-10-CM | POA: Diagnosis not present

## 2019-09-10 DIAGNOSIS — G8929 Other chronic pain: Secondary | ICD-10-CM

## 2019-09-10 MED ORDER — BACLOFEN 10 MG PO TABS: 10.0000 mg | ORAL_TABLET | Freq: Every day | ORAL | 1 refills | Status: DC

## 2019-09-10 MED ORDER — HYDROCODONE-ACETAMINOPHEN 5-325 MG PO TABS: 1.0000 | ORAL_TABLET | Freq: Every day | ORAL | 0 refills | Status: DC | PRN

## 2019-10-16 ENCOUNTER — Emergency Department
Admission: EM | Admit: 2019-10-16 | Discharge: 2019-10-16 | Disposition: A | Discharge status: Home / Self Care | Payer: 59 | Attending: Emergency Medicine | Admitting: Emergency Medicine

## 2019-10-16 ENCOUNTER — Emergency Department: Payer: 59

## 2019-10-16 ENCOUNTER — Encounter: Payer: Self-pay | Admitting: Emergency Medicine

## 2019-10-16 ENCOUNTER — Other Ambulatory Visit: Payer: Self-pay

## 2019-10-16 DIAGNOSIS — U071 COVID-19: Secondary | ICD-10-CM | POA: Insufficient documentation

## 2019-10-16 DIAGNOSIS — Z79899 Other long term (current) drug therapy: Secondary | ICD-10-CM | POA: Insufficient documentation

## 2019-10-16 DIAGNOSIS — I10 Essential (primary) hypertension: Secondary | ICD-10-CM | POA: Insufficient documentation

## 2019-10-16 DIAGNOSIS — R05 Cough: Secondary | ICD-10-CM | POA: Diagnosis not present

## 2019-10-16 LAB — POC SARS CORONAVIRUS 2 AG: SARS Coronavirus 2 Ag: POSITIVE — AB

## 2019-10-16 MED ORDER — ACETAMINOPHEN 500 MG PO TABS: 500.0000 mg | ORAL_TABLET | Freq: Four times a day (QID) | ORAL | 0 refills | Status: DC | PRN

## 2019-10-16 MED ORDER — BENZONATATE 100 MG PO CAPS: 100.0000 mg | ORAL_CAPSULE | Freq: Three times a day (TID) | ORAL | 0 refills | Status: DC | PRN

## 2019-10-16 NOTE — ED Triage Notes (Signed)
Pt reports was seen at the [facility] this am for sneezing, coughing and ear pain and the MD told him to come to the ED and get an x-ray because she heard something in his lungs. Pt denies fevers.

## 2019-10-16 NOTE — ED Provider Notes (Signed)
[facility] Emergency Department Provider Note  ____________________________________________  Time seen: Approximately 11:58 AM  I have reviewed the triage vital signs and the nursing notes.   HISTORY  Chief Complaint Otalgia and Cough    HPI Jose Washington is a 53 y.o. male that presents to the emergency department for evaluation of fever, right ear pain, nasal congestion, sinus pain, sore throat, nonproductive cough for 2 days.  Fever yesterday was 101 and fever today was 102.  Patient took Tylenol.  Patient does state that he has had a couple episodes of diarrhea but thinks this is due to the large quantity of M&Ms he has been eating.  Patient has areas of chronic body aches and states that they feel a little bit worse than usual.  [facility] recommended that he come to the emergency department for a chest x-ray.  Patient works at [employer] so he likely has Covid contacts.  Patient does not smoke.  He has a history of bronchitis and usually gets bronchitis around this time of year.  Patient had a febrile illness back in October and tested negative for Covid at that time.  No SOB, CP, vomiting, abdominal pain.   Past Medical History:  Diagnosis Date  . Allergy   . Anxiety   . Chronic duodenal ulcer with hemorrhage 2012  . Chronic neck pain   . Depression   . Hyperlipidemia   . Hypertension   . Microscopic hematuria     Patient Active Problem List   Diagnosis Date Noted  . Anxiety   . Chronic musculoskeletal pain 05/16/2019  . Abnormal MRI, lumbar spine (09/07/2017) 05/16/2019  . Preop testing 04/04/2019  . Inflammatory spondylopathy of lumbosacral region ([place]) 03/07/2019  . Chronic lower extremity pain (Right) 03/07/2019  . Spondylosis without myelopathy or radiculopathy, lumbosacral region 12/28/2018  . DDD (degenerative disc disease), lumbosacral 07/25/2018  . Lumbar facet hypertrophy (Multilevel) (Bilateral)  07/25/2018  . Depression, major, single episode, complete remission ([place]) 04/10/2018  . Chronic right shoulder pain 11/30/2017  . Suprascapular neuropathy, right 11/30/2017  . Chronic knee pain (Left) 08/25/2017  . Chronic lower extremity pain (Secondary area of Pain) (Left) 08/25/2017  . Grade 1 Retrolisthesis of L3 over L4 08/25/2017  . Lumbar facet arthropathy (Bilateral) 08/25/2017  . Lumbar facet osteoarthritis 08/25/2017  . Lumbar facet syndrome (Bilateral) (R>L) 08/25/2017  . Lumbar spondylosis 08/25/2017  . Major depression in remission ([place]) 08/22/2017  . Cervical facet syndrome ([place]) (L) 03/23/2017  . Cervical spondylosis 03/23/2017  . Musculoskeletal neck pain (trapezius) (Left) 03/23/2017  . Vitamin D insufficiency 02/08/2017  . Cervical DDD (C4-5, C5-6, C6-7 and C7-T1) 01/27/2017  . Cervical foraminal stenosis (Bilateral: C5-6 & C6-7, Left: C4-5 & C7-T1) 01/27/2017  . Cervical radiculitis (Bilateral) (L>R) 01/27/2017  . Muscle spasticity 01/04/2017  . Chronic pain syndrome 11/03/2016  . Chronic low back pain (Primary Area of Pain) (Bilateral) (L>R) w/o sciatica  11/03/2016  . Chronic upper back pain (Fourth area of Pain) (Bilateral) (L>R) 11/03/2016  . Chronic shoulder pain (Bilateral) (L>R) 11/03/2016  . Osteoarthritis of AC (acromioclavicular) joint (Right) 11/03/2016  . Chronic sacroiliac joint pain (Bilateral) (L>R) 11/03/2016  . Long term current use of opiate analgesic 11/03/2016  . Long term prescription opiate use 11/03/2016  . Opiate use (5 MME/Day) 11/03/2016  . Gastritis 05/12/2016  . Chronic neck pain (Third area of Pain) (Bilateral) (L>R) 04/09/2015  . Hyperlipidemia 04/09/2015  . Essential hypertension, benign 04/09/2015  Past Surgical History:  Procedure Laterality Date  . bLEEDING ULCER  2012  . eye muscle repair  1972 and 1975    Prior to Admission medications   Medication Sig Start Date End Date Taking? Authorizing Provider  acetaminophen  (TYLENOL) 500 MG tablet Take 1 tablet (500 mg total) by mouth every 6 (six) hours as needed. 10/16/19   Laban Emperor, PA-C  albuterol (VENTOLIN HFA) 108 (90 Base) MCG/ACT inhaler TAKE 2 PUFFS BY MOUTH EVERY 6 HOURS AS NEEDED FOR WHEEZE OR SHORTNESS OF BREATH 07/10/19   Volney American, PA-C  atenolol (TENORMIN) 50 MG tablet Take 1 tablet (50 mg total) by mouth daily. 06/15/19   Volney American, PA-C  atorvastatin (LIPITOR) 40 MG tablet Take 1 tablet (40 mg total) by mouth daily. 06/15/19   Volney American, PA-C  baclofen (LIORESAL) 10 MG tablet Take 1 tablet (10 mg total) by mouth at bedtime. 10/03/19 03/31/20  Milinda Pointer, MD  benzonatate (TESSALON PERLES) 100 MG capsule Take 1 capsule (100 mg total) by mouth 3 (three) times daily as needed. 10/16/19 10/15/20  Laban Emperor, PA-C  busPIRone (BUSPAR) 30 MG tablet Take 1 tablet (30 mg total) by mouth 2 (two) times daily. 06/15/19   Volney American, PA-C  Calcium Carbonate (CALCIUM 600 PO) Take 600 mg by mouth daily.    [provider]  Cholecalciferol (VITAMIN D3) 1000 units CAPS Take 1 capsule by mouth daily.    [provider]  desloratadine-pseudoephedrine (CLARINEX-D 12-HOUR) 2.5-120 MG 12 hr tablet Take 1 tablet by mouth as needed.    [provider]  DiphenhydrAMINE HCl (DIPHEDRYL ALLERGY PO) Take 25 mg by mouth daily.    [provider]  DULoxetine (CYMBALTA) 30 MG capsule Take 1 capsule (30 mg total) by mouth 2 (two) times daily. 06/15/19   Volney American, PA-C  fenofibrate 160 MG tablet Take 1 tablet (160 mg total) by mouth daily. 06/15/19   Volney American, PA-C  fluticasone [facility]) 50 MCG/ACT nasal spray Place 2 sprays into both nostrils 2 (two) times daily.    [provider]  Glucosamine-Chondroit-Vit C-Mn (GLUCOSAMINE 1500 COMPLEX PO) Take by mouth 2 (two) times daily.    [provider]  HYDROcodone-acetaminophen (NORCO/VICODIN) 5-325 MG  tablet Take 1 tablet by mouth daily as needed for severe pain. Must last 30 days. 10/03/19 11/02/19  Milinda Pointer, MD  HYDROcodone-acetaminophen (NORCO/VICODIN) 5-325 MG tablet Take 1 tablet by mouth daily as needed for severe pain. Must last 30 days. 11/02/19 12/02/19  Milinda Pointer, MD  HYDROcodone-acetaminophen (NORCO/VICODIN) 5-325 MG tablet Take 1 tablet by mouth daily as needed for severe pain. Must last 30 days. 12/02/19 01/01/20  Milinda Pointer, MD  hydrOXYzine (ATARAX/VISTARIL) 25 MG tablet Take 1 tablet (25 mg total) by mouth 3 (three) times daily as needed. 06/15/19   Volney American, PA-C  ibuprofen (ADVIL,MOTRIN) 200 MG tablet Take 200 mg by mouth as needed (take 5 times a week).     [provider]  magnesium oxide (MAG-OX) 400 MG tablet Take 400 mg by mouth daily.    [provider]  Melatonin 5 MG TABS Take by mouth at bedtime.     [provider]  Methylsulfonylmethane (MSM PO) Take by mouth daily.     [provider]  Multiple Vitamin (MULTIVITAMIN) tablet Take 1 tablet by mouth daily.    [provider]  omeprazole (PRILOSEC) 20 MG capsule TAKE 1 CAPSULE BY MOUTH EVERY DAY 04/06/19  Volney American, PA-C  ondansetron (ZOFRAN) 8 MG tablet Take 1 tablet (8 mg total) by mouth 2 (two) times daily. 06/15/19   Volney American, PA-C  Potassium 99 MG TABS Take 1 tablet by mouth.    [provider]  simethicone (MYLICON) 80 MG chewable tablet Chew 80 mg by mouth every 6 (six) hours as needed for flatulence.    [provider]  sucralfate (CARAFATE) 1 g tablet TAKE 1 TABLET (1 G TOTAL) BY MOUTH 4 (FOUR) TIMES DAILY - WITH MEALS AND AT BEDTIME. 08/09/19   Volney American, PA-C  vitamin B-12 (CYANOCOBALAMIN) 1000 MCG tablet Take 1,000 mcg by mouth daily.    [provider]    Allergies Patient has no known allergies.  Family History  Problem Relation Age of Onset  . Diabetes Mother   .  Cancer Mother        breast- in remission  . Mental illness Father   . Depression Father   . Dementia Father        VASCULAR  . Allergies Brother   . Cancer Maternal Grandfather        colon  . Stroke Paternal Grandfather   . Dementia Paternal Grandfather        vascular    Social History Social History   Tobacco Use  . Smoking status: Never Smoker  . Smokeless tobacco: Never Used  Substance Use Topics  . Alcohol use: No    Alcohol/week: 0.0 standard drinks  . Drug use: No     Review of Systems  Constitutional: Positive for fever. Eyes: No visual changes. No discharge. ENT: Positive for congestion and rhinorrhea.  Positive for ear pain. Cardiovascular: No chest pain. Respiratory: Positive for cough. No SOB. Gastrointestinal: No abdominal pain.  No nausea, no vomiting.  Positive for diarrhea.  No constipation. Musculoskeletal: Positive for body aches. Skin: Negative for rash, abrasions, lacerations, ecchymosis. Neurological: Negative for headaches.   ____________________________________________   PHYSICAL EXAM:  VITAL SIGNS: ED Triage Vitals  Enc Vitals Group     BP 10/16/19 1117 (!) 143/97     Pulse Rate 10/16/19 1117 82     Resp 10/16/19 1117 20     Temp 10/16/19 1117 99.3 F (37.4 C)     Temp Source 10/16/19 1117 Oral     SpO2 10/16/19 1117 97 %     Weight 10/16/19 1100 187 lb (84.8 kg)     Height 10/16/19 1100 5\' 7"  (1.702 m)     Head Circumference --      Peak Flow --      Pain Score 10/16/19 1100 2     Pain Loc --      Pain Edu? --      Excl. in [place]? --      Constitutional: Alert and oriented. Well appearing and in no acute distress. Eyes: Conjunctivae are normal. PERRL. EOMI. No discharge. Head: Atraumatic. ENT: Maxillary sinus tenderness.      Ears: Tympanic membranes pearly gray with good landmarks. No discharge.      Nose: Mild congestion/rhinnorhea.      Mouth/Throat: Mucous membranes are moist. Oropharynx non-erythematous. Tonsils not  enlarged. No exudates. Uvula midline. Neck: No stridor.   Hematological/Lymphatic/Immunilogical: No cervical lymphadenopathy. Cardiovascular: Normal rate, regular rhythm.  Good peripheral circulation. Respiratory: Normal respiratory effort without tachypnea or retractions. Lungs CTAB. Good air entry to the bases with no decreased or absent breath sounds. Gastrointestinal: Bowel sounds 4 quadrants. Soft and nontender to  palpation. No guarding or rigidity. No palpable masses. No distention. Musculoskeletal: Full range of motion to all extremities. No gross deformities appreciated. Neurologic:  Normal speech and language. No gross focal neurologic deficits are appreciated.  Skin:  Skin is warm, dry and intact. No rash noted. Psychiatric: Mood and affect are normal. Speech and behavior are normal. Patient exhibits appropriate insight and judgement.   ____________________________________________   LABS (all labs ordered are listed, but only abnormal results are displayed)  Labs Reviewed  POC SARS CORONAVIRUS 2 AG - Abnormal; Notable for the following components:      Result Value   SARS Coronavirus 2 Ag POSITIVE (*)    All other components within normal limits  POC SARS CORONAVIRUS 2 AG -  ED   ____________________________________________  EKG   ____________________________________________  RADIOLOGY Robinette Haines, personally viewed and evaluated these images (plain radiographs) as part of my medical decision making, as well as reviewing the written report by the radiologist.  DG Chest 2 View  Result Date: 10/16/2019 CLINICAL DATA:  Cough with abnormal breath sounds on physical examination EXAM: CHEST - 2 VIEW COMPARISON:  May 30, 2011 FINDINGS: There is no edema or consolidation. Heart size and pulmonary vascularity are normal. No adenopathy. No bone lesions. No pneumothorax. IMPRESSION: No edema or consolidation.  No adenopathy. Electronically Signed   By: Lowella Grip III  M.D.   On: 10/16/2019 11:19    ____________________________________________    PROCEDURES  Procedure(s) performed:    Procedures    Medications - No data to display   ____________________________________________   INITIAL IMPRESSION / ASSESSMENT AND PLAN / ED COURSE  Pertinent labs & imaging results that were available during my care of the patient were reviewed by me and considered in my medical decision making (see chart for details).  Review of the [place] CSRS was performed in accordance of the [place] prior to dispensing any controlled drugs.   Patient's diagnosis is consistent with Covid 19. Vital signs and exam are reassuring.  Chest x-ray negative for acute cardiopulmonary processes.  Covid test is positive.  Patient appears well and is staying well hydrated.  Patient is nontoxic.  Patient feels comfortable going home. Patient will be discharged home with prescriptions for Tessalon Perles and Tylenol. Patient is to follow up with primary care as needed or otherwise directed. Patient is given ED precautions to return to the ED for any worsening or new symptoms.   Jose Washington was evaluated in Emergency Department on 10/16/2019 for the symptoms described in the history of present illness. He was evaluated in the context of the global COVID-19 pandemic, which necessitated consideration that the patient might be at risk for infection with the SARS-CoV-2 virus that causes COVID-19. Institutional protocols and algorithms that pertain to the evaluation of patients at risk for COVID-19 are in a state of rapid change based on information released by regulatory bodies including the CDC and federal and state organizations. These policies and algorithms were followed during the patient's care in the ED.  ____________________________________________  FINAL CLINICAL IMPRESSION(S) / ED DIAGNOSES  Final diagnoses:  COVID-19      NEW MEDICATIONS STARTED DURING THIS VISIT:  ED  Discharge Orders         Ordered    benzonatate (TESSALON PERLES) 100 MG capsule  3 times daily PRN     10/16/19 1250    acetaminophen (TYLENOL) 500 MG tablet  Every 6 hours PRN     10/16/19 1250  This chart was dictated using voice recognition software/Dragon. Despite best efforts to proofread, errors can occur which can change the meaning. Any change was purely unintentional.    Laban Emperor, PA-C 10/16/19 1432    Arta Silence, MD 10/16/19 1447

## 2019-10-16 NOTE — ED Notes (Signed)
See triage note  States he went to [facility] with cough and fever  States his fever at home was 102 ?  On arrival low grade   States he did take tylenol PTA   Also felt pop in chest with cough

## 2019-10-16 NOTE — ED Notes (Signed)
See triage note  Presents with cough and sneezing  States he felt a pop in chest

## 2019-10-30 DIAGNOSIS — Z7689 Persons encountering health services in other specified circumstances: Secondary | ICD-10-CM | POA: Diagnosis not present

## 2019-11-06 ENCOUNTER — Ambulatory Visit: Payer: 59 | Admitting: Pain Medicine

## 2019-11-15 ENCOUNTER — Other Ambulatory Visit: Payer: Self-pay | Admitting: Family Medicine

## 2019-11-22 ENCOUNTER — Encounter: Payer: Self-pay | Admitting: Pain Medicine

## 2019-11-22 ENCOUNTER — Other Ambulatory Visit: Payer: Self-pay

## 2019-11-22 ENCOUNTER — Ambulatory Visit (HOSPITAL_BASED_OUTPATIENT_CLINIC_OR_DEPARTMENT_OTHER): Payer: 59 | Admitting: Pain Medicine

## 2019-11-22 ENCOUNTER — Ambulatory Visit
Admission: RE | Admit: 2019-11-22 | Discharge: 2019-11-22 | Disposition: A | Discharge status: Home / Self Care | Payer: 59 | Source: Ambulatory Visit | Attending: Pain Medicine | Admitting: Pain Medicine

## 2019-11-22 VITALS — BP 124/93 | HR 62 | Temp 98.3°F | Resp 16 | Ht 67.0 in | Wt 185.0 lb

## 2019-11-22 DIAGNOSIS — M4310 Spondylolisthesis, site unspecified: Secondary | ICD-10-CM | POA: Insufficient documentation

## 2019-11-22 DIAGNOSIS — M51379 Other intervertebral disc degeneration, lumbosacral region without mention of lumbar back pain or lower extremity pain: Secondary | ICD-10-CM

## 2019-11-22 DIAGNOSIS — G8929 Other chronic pain: Secondary | ICD-10-CM | POA: Diagnosis present

## 2019-11-22 DIAGNOSIS — G894 Chronic pain syndrome: Secondary | ICD-10-CM | POA: Insufficient documentation

## 2019-11-22 DIAGNOSIS — M47817 Spondylosis without myelopathy or radiculopathy, lumbosacral region: Secondary | ICD-10-CM | POA: Diagnosis present

## 2019-11-22 DIAGNOSIS — G8918 Other acute postprocedural pain: Secondary | ICD-10-CM | POA: Diagnosis present

## 2019-11-22 DIAGNOSIS — M545 Low back pain: Secondary | ICD-10-CM | POA: Diagnosis present

## 2019-11-22 DIAGNOSIS — M5137 Other intervertebral disc degeneration, lumbosacral region: Secondary | ICD-10-CM | POA: Diagnosis not present

## 2019-11-22 DIAGNOSIS — M47816 Spondylosis without myelopathy or radiculopathy, lumbar region: Secondary | ICD-10-CM

## 2019-11-22 MED ORDER — LACTATED RINGERS IV SOLN
1000.0000 mL | Freq: Once | INTRAVENOUS | Status: AC
  Administered 2019-11-22: 14:00:00 1000 mL via INTRAVENOUS

## 2019-11-22 MED ORDER — MIDAZOLAM HCL 5 MG/5ML IJ SOLN
1.0000 mg | INTRAMUSCULAR | Status: DC | PRN
  Administered 2019-11-22: 14:00:00 2.5 mg via INTRAVENOUS
  Filled 2019-11-22: qty 5

## 2019-11-22 MED ORDER — FENTANYL CITRATE (PF) 100 MCG/2ML IJ SOLN
25.0000 ug | INTRAMUSCULAR | Status: DC | PRN
  Administered 2019-11-22: 14:00:00 75 ug via INTRAVENOUS
  Filled 2019-11-22: qty 2

## 2019-11-22 MED ORDER — HYDROCODONE-ACETAMINOPHEN 5-325 MG PO TABS: 1.0000 | ORAL_TABLET | Freq: Every day | ORAL | 0 refills | Status: DC | PRN

## 2019-11-22 MED ORDER — OXYCODONE-ACETAMINOPHEN 5-325 MG PO TABS: 1.0000 | ORAL_TABLET | Freq: Three times a day (TID) | ORAL | 0 refills | Status: AC | PRN

## 2019-11-22 MED ORDER — LIDOCAINE HCL 2 % IJ SOLN
20.0000 mL | Freq: Once | INTRAMUSCULAR | Status: AC
  Administered 2019-11-22: 14:00:00 400 mg
  Filled 2019-11-22: qty 40

## 2019-11-22 MED ORDER — ROPIVACAINE HCL 2 MG/ML IJ SOLN
9.0000 mL | Freq: Once | INTRAMUSCULAR | Status: AC
  Administered 2019-11-22: 14:00:00 9 mL via PERINEURAL
  Filled 2019-11-22: qty 10

## 2019-11-22 MED ORDER — TRIAMCINOLONE ACETONIDE 40 MG/ML IJ SUSP
40.0000 mg | Freq: Once | INTRAMUSCULAR | Status: AC
  Administered 2019-11-22: 14:00:00 40 mg
  Filled 2019-11-22: qty 1

## 2019-11-22 NOTE — Patient Instructions (Signed)

## 2019-11-22 NOTE — Progress Notes (Signed)
PROVIDER NOTE: Information contained herein reflects review and annotations entered in association with encounter. Interpretation of such information and data should be left to medically-trained personnel. Information provided to patient can be located elsewhere in the medical record under "Patient Instructions". Document created using STT-dictation technology, any transcriptional errors that may result from process are unintentional.    Patient: Jose Washington  Service Category: Procedure  Provider: Gaspar Cola, MD  DOB: 12-13-1965  DOS: 11/22/2019  Location: [facility]  MRN: [id number]  Setting: Ambulatory - outpatient  Referring Provider: Milinda Pointer, MD  Type: Established Patient  Specialty: Interventional Pain Management  PCP: Volney American, PA-C   Primary Reason for Visit: Interventional Pain Management Treatment. CC: Back Pain  Procedure:          Anesthesia, Analgesia, Anxiolysis:  Type: Thermal Lumbar Facet, Medial Branch Radiofrequency Ablation/Neurotomy  #1  Primary Purpose: Therapeutic Region: Posterolateral Lumbosacral Spine Level: L2, L3, L4, L5, & S1 Medial Branch Level(s). These levels will denervate the L3-4, L4-5, and the L5-S1 lumbar facet joints. Laterality: Right  Type: Moderate (Conscious) Sedation combined with Local Anesthesia Indication(s): Analgesia and Anxiety Route: Intravenous (IV) IV Access: Secured Sedation: Meaningful verbal contact was maintained at all times during the procedure  Local Anesthetic: Lidocaine 1-2%  Position: Prone   Indications: 1. Lumbar facet syndrome (Bilateral) (R>L)   2. Spondylosis without myelopathy or radiculopathy, lumbosacral region   3. Lumbar facet hypertrophy (Multilevel) (Bilateral)   4. Lumbar facet osteoarthritis   5. Lumbar facet arthropathy (Bilateral)   6. DDD (degenerative disc disease), lumbosacral   7. Chronic low back pain (Primary Area of Pain) (Bilateral) (L>R) w/o  sciatica     Jose Washington has been dealing with the above chronic pain for longer than three months and has either failed to respond, was unable to tolerate, or simply did not get enough benefit from other more conservative therapies including, but not limited to: 1. Over-the-counter medications 2. Anti-inflammatory medications 3. Muscle relaxants 4. Membrane stabilizers 5. Opioids 6. Physical therapy and/or chiropractic manipulation 7. Modalities (Heat, ice, etc.) 8. Invasive techniques such as nerve blocks. Jose Washington has attained more than 50% relief of the pain from a series of diagnostic injections conducted in separate occasions.  Pain Score: Pre-procedure: 3 /10 Post-procedure: 0-No pain/10  Pre-op Assessment:  Jose Washington is a 54 y.o. (year old), male patient, seen today for interventional treatment. He  has a past surgical history that includes eye muscle repair (1972 and 1975) and bLEEDING ULCER (2012). Jose Washington has a current medication list which includes the following prescription(s): acetaminophen, albuterol, atenolol, atorvastatin, baclofen, buspirone, calcium carbonate, desloratadine-pseudoephedrine, diphenhydramine hcl, duloxetine, fenofibrate, fluticasone, glucosamine-chondroit-vit c-mn, [START ON 12/02/2019] hydrocodone-acetaminophen, hydroxyzine, ibuprofen, magnesium oxide, melatonin, methylsulfonylmethane, multivitamin, omeprazole, ondansetron, potassium, simethicone, sucralfate, vitamin b-12, benzonatate, vitamin d3, [START ON 01/01/2020] hydrocodone-acetaminophen, [START ON 01/31/2020] hydrocodone-acetaminophen, oxycodone-acetaminophen, and [START ON 11/29/2019] oxycodone-acetaminophen, and the following Facility-Administered Medications: fentanyl and midazolam. His primarily concern today is the Back Pain  Initial Vital Signs:  Pulse/HCG Rate: 62ECG Heart Rate: 67 Temp: 98 F (36.7 C) Resp: 18 BP: (!) 141/82 SpO2: 100 %  BMI: Estimated body mass index is 28.98 kg/m as  calculated from the following:   Height as of this encounter: 5\' 7"  (1.702 m).   Weight as of this encounter: 185 lb (83.9 kg).  Risk Assessment: Allergies: Reviewed. He has No Known Allergies.  Allergy Precautions: None required Coagulopathies: Reviewed. None identified.  Blood-thinner therapy: None at this time  Active Infection(s): Reviewed. None identified. Jose Washington is afebrile  Site Confirmation: Jose Washington was asked to confirm the procedure and laterality before marking the site Procedure checklist: Completed Consent: Before the procedure and under the influence of no sedative(s), amnesic(s), or anxiolytics, the patient was informed of the treatment options, risks and possible complications. To fulfill our ethical and legal obligations, as recommended by the American Medical Association's Code of Ethics, I have informed the patient of my clinical impression; the nature and purpose of the treatment or procedure; the risks, benefits, and possible complications of the intervention; the alternatives, including doing nothing; the risk(s) and benefit(s) of the alternative treatment(s) or procedure(s); and the risk(s) and benefit(s) of doing nothing. The patient was provided information about the general risks and possible complications associated with the procedure. These may include, but are not limited to: failure to achieve desired goals, infection, bleeding, organ or nerve damage, allergic reactions, paralysis, and death. In addition, the patient was informed of those risks and complications associated to Spine-related procedures, such as failure to decrease pain; infection (i.e.: Meningitis, epidural or intraspinal abscess); bleeding (i.e.: epidural hematoma, subarachnoid hemorrhage, or any other type of intraspinal or peri-dural bleeding); organ or nerve damage (i.e.: Any type of peripheral nerve, nerve root, or spinal cord injury) with subsequent damage to sensory, motor, and/or autonomic  systems, resulting in permanent pain, numbness, and/or weakness of one or several areas of the body; allergic reactions; (i.e.: anaphylactic reaction); and/or death. Furthermore, the patient was informed of those risks and complications associated with the medications. These include, but are not limited to: allergic reactions (i.e.: anaphylactic or anaphylactoid reaction(s)); adrenal axis suppression; blood sugar elevation that in diabetics may result in ketoacidosis or comma; water retention that in patients with history of congestive heart failure may result in shortness of breath, pulmonary edema, and decompensation with resultant heart failure; weight gain; swelling or edema; medication-induced neural toxicity; particulate matter embolism and blood vessel occlusion with resultant organ, and/or nervous system infarction; and/or aseptic necrosis of one or more joints. Finally, the patient was informed that Medicine is not an exact science; therefore, there is also the possibility of unforeseen or unpredictable risks and/or possible complications that may result in a catastrophic outcome. The patient indicated having understood very clearly. We have given the patient no guarantees and we have made no promises. Enough time was given to the patient to ask questions, all of which were answered to the patient's satisfaction. Mr. Arnwine has indicated that he wanted to continue with the procedure. Attestation: I, the ordering provider, attest that I have discussed with the patient the benefits, risks, side-effects, alternatives, likelihood of achieving goals, and potential problems during recovery for the procedure that I have provided informed consent. Date  Time: 11/22/2019 12:51 PM  Pre-Procedure Preparation:  Monitoring: As per clinic protocol. Respiration, ETCO2, SpO2, BP, heart rate and rhythm monitor placed and checked for adequate function Safety Precautions: Patient was assessed for positional comfort  and pressure points before starting the procedure. Time-out: I initiated and conducted the "Time-out" before starting the procedure, as per protocol. The patient was asked to participate by confirming the accuracy of the "Time Out" information. Verification of the correct person, site, and procedure were performed and confirmed by me, the nursing staff, and the patient. "Time-out" conducted as per Joint Commission's Universal Protocol (UP.01.01.01). Time: 1350  Description of Procedure:          Laterality: Right Levels:  L2, L3, L4, L5, & S1  Medial Branch Level(s), at the L3-4, L4-5, and the L5-S1 lumbar facet joints. Area Prepped: Lumbosacral Prepping solution: DuraPrep (Iodine Povacrylex [0.7% available iodine] and Isopropyl Alcohol, 74% w/w) Safety Precautions: Aspiration looking for blood return was conducted prior to all injections. At no point did we inject any substances, as a needle was being advanced. Before injecting, the patient was told to immediately notify me if he was experiencing any new onset of "ringing in the ears, or metallic taste in the mouth". No attempts were made at seeking any paresthesias. Safe injection practices and needle disposal techniques used. Medications properly checked for expiration dates. SDV (single dose vial) medications used. After the completion of the procedure, all disposable equipment used was discarded in the proper designated medical waste containers. Local Anesthesia: Protocol guidelines were followed. The patient was positioned over the fluoroscopy table. The area was prepped in the usual manner. The time-out was completed. The target area was identified using fluoroscopy. A 12-in long, straight, sterile hemostat was used with fluoroscopic guidance to locate the targets for each level blocked. Once located, the skin was marked with an approved surgical skin marker. Once all sites were marked, the skin (epidermis, dermis, and hypodermis), as well as deeper  tissues (fat, connective tissue and muscle) were infiltrated with a small amount of a short-acting local anesthetic, loaded on a 10cc syringe with a 25G, 1.5-in  Needle. An appropriate amount of time was allowed for local anesthetics to take effect before proceeding to the next step. Local Anesthetic: Lidocaine 2.0% The unused portion of the local anesthetic was discarded in the proper designated containers. Technical explanation of process:  Radiofrequency Ablation (RFA) L2 Medial Branch Nerve RFA: The target area for the L2 medial branch is at the junction of the postero-lateral aspect of the superior articular process and the superior, posterior, and medial edge of the transverse process of L3. Under fluoroscopic guidance, a Radiofrequency needle was inserted until contact was made with os over the superior postero-lateral aspect of the pedicular shadow (target area). Sensory and motor testing was conducted to properly adjust the position of the needle. Once satisfactory placement of the needle was achieved, the numbing solution was slowly injected after negative aspiration for blood. 2.0 mL of the nerve block solution was injected without difficulty or complication. After waiting for at least 3 minutes, the ablation was performed. Once completed, the needle was removed intact. L3 Medial Branch Nerve RFA: The target area for the L3 medial branch is at the junction of the postero-lateral aspect of the superior articular process and the superior, posterior, and medial edge of the transverse process of L4. Under fluoroscopic guidance, a Radiofrequency needle was inserted until contact was made with os over the superior postero-lateral aspect of the pedicular shadow (target area). Sensory and motor testing was conducted to properly adjust the position of the needle. Once satisfactory placement of the needle was achieved, the numbing solution was slowly injected after negative aspiration for blood. 2.0 mL of the  nerve block solution was injected without difficulty or complication. After waiting for at least 3 minutes, the ablation was performed. Once completed, the needle was removed intact. L4 Medial Branch Nerve RFA: The target area for the L4 medial branch is at the junction of the postero-lateral aspect of the superior articular process and the superior, posterior, and medial edge of the transverse process of L5. Under fluoroscopic guidance, a Radiofrequency needle was inserted until contact was made with os over the superior postero-lateral aspect of  the pedicular shadow (target area). Sensory and motor testing was conducted to properly adjust the position of the needle. Once satisfactory placement of the needle was achieved, the numbing solution was slowly injected after negative aspiration for blood. 2.0 mL of the nerve block solution was injected without difficulty or complication. After waiting for at least 3 minutes, the ablation was performed. Once completed, the needle was removed intact. L5 Medial Branch Nerve RFA: The target area for the L5 medial branch is at the junction of the postero-lateral aspect of the superior articular process of S1 and the superior, posterior, and medial edge of the sacral ala. Under fluoroscopic guidance, a Radiofrequency needle was inserted until contact was made with os over the superior postero-lateral aspect of the pedicular shadow (target area). Sensory and motor testing was conducted to properly adjust the position of the needle. Once satisfactory placement of the needle was achieved, the numbing solution was slowly injected after negative aspiration for blood. 2.0 mL of the nerve block solution was injected without difficulty or complication. After waiting for at least 3 minutes, the ablation was performed. Once completed, the needle was removed intact. S1 Medial Branch Nerve RFA: The target area for the S1 medial branch is located inferior to the junction of the S1  superior articular process and the L5 inferior articular process, posterior, inferior, and lateral to the 6 o'clock position of the L5-S1 facet joint, just superior to the S1 posterior foramen. Under fluoroscopic guidance, the Radiofrequency needle was advanced until contact was made with os over the Target area. Sensory and motor testing was conducted to properly adjust the position of the needle. Once satisfactory placement of the needle was achieved, the numbing solution was slowly injected after negative aspiration for blood. 2.0 mL of the nerve block solution was injected without difficulty or complication. After waiting for at least 3 minutes, the ablation was performed. Once completed, the needle was removed intact. Radiofrequency lesioning (ablation):  Radiofrequency Generator: NeuroTherm [id number] Sensory Stimulation Parameters: 50 Hz was used to locate & identify the nerve, making sure that the needle was positioned such that there was no sensory stimulation below 0.3 V or above 0.7 V. Motor Stimulation Parameters: 2 Hz was used to evaluate the motor component. Care was taken not to lesion any nerves that demonstrated motor stimulation of the lower extremities at an output of less than 2.5 times that of the sensory threshold, or a maximum of 2.0 V. Lesioning Technique Parameters: Standard Radiofrequency settings. (Not bipolar or pulsed.) Temperature Settings: 80 degrees C Lesioning time: 60 seconds Intra-operative Compliance: Compliant Materials & Medications: Needle(s) (Electrode/Cannula) Type: Teflon-coated, curved tip, Radiofrequency needle(s) Gauge: 22G Length: 10cm Numbing solution: 0.2% PF-Ropivacaine + Triamcinolone (40 mg/mL) diluted to a final concentration of 4 mg of Triamcinolone/mL of Ropivacaine The unused portion of the solution was discarded in the proper designated containers.  Once the entire procedure was completed, the treated area was cleaned, making sure to leave some of  the prepping solution back to take advantage of its long term bactericidal properties.  Illustration of the posterior view of the lumbar spine and the posterior neural structures. Laminae of L2 through S1 are labeled. DPRL5, dorsal primary ramus of L5; DPRS1, dorsal primary ramus of S1; DPR3, dorsal primary ramus of L3; FJ, facet (zygapophyseal) joint L3-L4; I, inferior articular process of L4; LB1, lateral branch of dorsal primary ramus of L1; IAB, inferior articular branches from L3 medial branch (supplies L4-L5 facet joint); IBP, intermediate branch plexus; MB3,  medial branch of dorsal primary ramus of L3; NR3, third lumbar nerve root; S, superior articular process of L5; SAB, superior articular branches from L4 (supplies L4-5 facet joint also); TP3, transverse process of L3.  Vitals:   11/22/19 1421 11/22/19 1428 11/22/19 1438 11/22/19 1448  BP: (!) 135/100 113/87 105/80 (!) 124/93  Pulse:      Resp: 16 15 14 16   Temp:  98.3 F (36.8 C)    TempSrc:  Temporal    SpO2: 96% 99% 99% 98%  Weight:      Height:       Start Time: 1350 hrs. End Time: 1421 hrs.  Imaging Guidance (Spinal):          Type of Imaging Technique: Fluoroscopy Guidance (Spinal) Indication(s): Assistance in needle guidance and placement for procedures requiring needle placement in or near specific anatomical locations not easily accessible without such assistance. Exposure Time: Please see nurses notes. Contrast: None used. Fluoroscopic Guidance: I was personally present during the use of fluoroscopy. "Tunnel Vision Technique" used to obtain the best possible view of the target area. Parallax error corrected before commencing the procedure. "Direction-depth-direction" technique used to introduce the needle under continuous pulsed fluoroscopy. Once target was reached, antero-posterior, oblique, and lateral fluoroscopic projection used confirm needle placement in all planes. Images permanently stored in EMR. Interpretation:  No contrast injected. I personally interpreted the imaging intraoperatively. Adequate needle placement confirmed in multiple planes. Permanent images saved into the patient's record.  Antibiotic Prophylaxis:   Anti-infectives (From admission, onward)   None     Indication(s): None identified  Post-operative Assessment:  Post-procedure Vital Signs:  Pulse/HCG Rate: 6273 Temp: 98.3 F (36.8 C) Resp: 16 BP: (!) 124/93 SpO2: 98 %  EBL: None  Complications: No immediate post-treatment complications observed by team, or reported by patient.  Note: The patient tolerated the entire procedure well. A repeat set of vitals were taken after the procedure and the patient was kept under observation following institutional policy, for this type of procedure. Post-procedural neurological assessment was performed, showing return to baseline, prior to discharge. The patient was provided with post-procedure discharge instructions, including a section on how to identify potential problems. Should any problems arise concerning this procedure, the patient was given instructions to immediately contact us, at any time, without hesitation. In any case, we plan to contact the patient by telephone for a follow-up status report regarding this interventional procedure.  Comments:  No additional relevant information.  Plan of Care  Orders:  Orders Placed This Encounter  Procedures  . Radiofrequency,Lumbar    Scheduling Instructions:     Side(s): Right-sided     Level: L3-4, L4-5, & L5-S1 Facets (L2, L3, L4, L5, & S1 Medial Branch Nerves)     Sedation: With Sedation     Timeframe: Today    Order Specific Question:   Where will this procedure be performed?    Answer:   [facility]  . DG PAIN [facility] C-ARM 1-60 MIN NO REPORT    Intraoperative interpretation by procedural physician at [place].    Standing Status:   Standing    Number of Occurrences:   1    Order Specific Question:    Reason for exam:    Answer:   Assistance in needle guidance and placement for procedures requiring needle placement in or near specific anatomical locations not easily accessible without such assistance.  . Informed Consent Details: Physician/Practitioner Attestation; Transcribe to consent form and obtain patient signature  Nursing Order: Transcribe to consent form and obtain patient signature. Note: Always confirm laterality of pain with Mr. Sedlak, before procedure. Procedure: Lumbar Facet Radiofrequency Ablation Indication/Reason: Low Back Pain, with our without leg pain, due to Facet Joint Arthralgia (Joint Pain) known as Lumbar Facet Syndrome, secondary to Lumbar, and/or Lumbosacral Spondylosis (Arthritis of the Spine), without myelopathy or radiculopathy (Nerve Damage). Provider Attestation: I, Hemingford Dossie Arbour, MD, (Pain Management Specialist), the physician/practitioner, attest that I have discussed with the patient the benefits, risks, side effects, alternatives, likelihood of achieving goals and potential problems during recovery for the procedure that I have provided informed consent.  . Provide equipment / supplies at bedside    Equipment required: Sterile "Radiofrequency Tray"; Large hemostat (1); Small hemostat (1); Towels (6-8); 4x4 sterile sponge pack (1) Radiofrequency Needle(s): Size: Regular Quantity: 5    Standing Status:   Standing    Number of Occurrences:   1    Order Specific Question:   Specify    Answer:   Radiofrequency Tray   Chronic Opioid Analgesic:  Hydrocodone/APAP 5/325, 1 tab PO QD (5 mg/day of hydrocodone) MME/day: 5 mg/day.   Medications ordered for procedure: Meds ordered this encounter  Medications  . lidocaine (XYLOCAINE) 2 % (with pres) injection 400 mg  . lactated ringers infusion 1,000 mL  . midazolam (VERSED) 5 MG/5ML injection 1-2 mg    Make sure Flumazenil is available in the pyxis when using this medication. If oversedation occurs,  administer 0.2 mg IV over 15 sec. If after 45 sec no response, administer 0.2 mg again over 1 min; may repeat at 1 min intervals; not to exceed 4 doses (1 mg)  . fentaNYL (SUBLIMAZE) injection 25-50 mcg    Make sure Narcan is available in the pyxis when using this medication. In the event of respiratory depression (RR< 8/min): Titrate NARCAN (naloxone) in increments of 0.1 to 0.2 mg IV at 2-3 minute intervals, until desired degree of reversal.  . ropivacaine (PF) 2 mg/mL (0.2%) (NAROPIN) injection 9 mL  . triamcinolone acetonide (KENALOG-40) injection 40 mg  . HYDROcodone-acetaminophen (NORCO/VICODIN) 5-325 MG tablet    Sig: Take 1 tablet by mouth daily as needed for severe pain. Must last 30 days.    Dispense:  30 tablet    Refill:  0    Chronic Pain: STOP Act (Not applicable) Fill 1 day early if closed on refill date. Do not fill until: 01/01/2020. To last until: 01/31/2020. Avoid benzodiazepines within 8 hours of opioids  . HYDROcodone-acetaminophen (NORCO/VICODIN) 5-325 MG tablet    Sig: Take 1 tablet by mouth daily as needed for severe pain. Must last 30 days.    Dispense:  30 tablet    Refill:  0    Chronic Pain: STOP Act (Not applicable) Fill 1 day early if closed on refill date. Do not fill until: 01/31/2020. To last until: 03/01/2020. Avoid benzodiazepines within 8 hours of opioids  . oxyCODONE-acetaminophen (PERCOCET) 5-325 MG tablet    Sig: Take 1 tablet by mouth every 8 (eight) hours as needed for up to 7 days for severe pain. Must last 7 days.    Dispense:  21 tablet    Refill:  0    For acute post-operative pain. Not to be refilled.  Must last 7 days.  Marland Kitchen oxyCODONE-acetaminophen (PERCOCET) 5-325 MG tablet    Sig: Take 1 tablet by mouth every 8 (eight) hours as needed for up to 7 days for severe pain. Must last 7 days.  Dispense:  21 tablet    Refill:  0    For acute post-operative pain. Not to be refilled.  Must last 7 days.   Medications administered: We administered lidocaine,  lactated ringers, midazolam, fentaNYL, ropivacaine (PF) 2 mg/mL (0.2%), and triamcinolone acetonide.  See the medical record for exact dosing, route, and time of administration.  Follow-up plan:   Return in about 2 weeks (around 12/06/2019) for (VV), (PP).       Interventional treatment options:  Under consideration: Therapeutic right lumbar facet RFA #1  Possible left lumbar facet RFA  Diagnostic bilateral cervical facetblock  Possible bilateral cervical facet RFA  Diagnostic bilateral sacroiliac joint block Possible bilateral sacroiliac joint RFA Diagnostic trigger point injections   Therapeutic/palliative (PRN): Diagnostic/therapeutic right lumbar facetblock #3  Diagnostic/therapeutic left lumbar facetblock #2  Diagnostic/therapeutic left L4-5LESI#3 Palliative left CESI#4    Recent Visits Date Type Provider Dept  09/10/19 Telemedicine Milinda Pointer, MD [facility]  Showing recent visits within past 90 days and meeting all other requirements   Today's Visits Date Type Provider Dept  11/22/19 Procedure visit Milinda Pointer, MD [facility]  Showing today's visits and meeting all other requirements   Future Appointments Date Type Provider Dept  12/05/19 Appointment Milinda Pointer, MD [facility]  12/26/19 Appointment Milinda Pointer, MD [facility]  Showing future appointments within next 90 days and meeting all other requirements   Disposition: Discharge home  Discharge (Date  Time): 11/22/2019; 1429 hrs.   Primary Care Physician: Volney American, PA-C Location: [facility] Note by: Gaspar Cola, MD Date: 11/22/2019; Time: 3:33 PM  Disclaimer:  Medicine is not an Chief Strategy Officer. The only guarantee in medicine is that nothing is guaranteed. It is important to note that the decision to proceed with this intervention was based on the information collected from the  patient. The Data and conclusions were drawn from the patient's questionnaire, the interview, and the physical examination. Because the information was provided in large part by the patient, it cannot be guaranteed that it has not been purposely or unconsciously manipulated. Every effort has been made to obtain as much relevant data as possible for this evaluation. It is important to note that the conclusions that lead to this procedure are derived in large part from the available data. Always take into account that the treatment will also be dependent on availability of resources and existing treatment guidelines, considered by other Pain Management Practitioners as being common knowledge and practice, at the time of the intervention. For Medico-Legal purposes, it is also important to point out that variation in procedural techniques and pharmacological choices are the acceptable norm. The indications, contraindications, technique, and results of the above procedure should only be interpreted and judged by a Board-Certified Interventional Pain Specialist with extensive familiarity and expertise in the same exact procedure and technique.

## 2019-11-22 NOTE — Progress Notes (Signed)
Safety precautions to be maintained throughout the outpatient stay will include: orient to surroundings, keep bed in low position, maintain call bell within reach at all times, provide assistance with transfer out of bed and ambulation.  

## 2019-11-23 ENCOUNTER — Telehealth: Payer: Self-pay

## 2019-11-23 NOTE — Telephone Encounter (Signed)
Post procedure phone call.  Patient states he is doing great.  

## 2019-12-04 ENCOUNTER — Encounter: Payer: Self-pay | Admitting: Pain Medicine

## 2019-12-04 NOTE — Progress Notes (Signed)
Pain relief after procedure (treated area only): (Questions asked to patient) 1. Starting about 15 minutes after the procedure, and "while the area was still numb" (from the local anesthetics), were you having any of your usual pain "in that area" (the treated area)?  (NOTE: NOT including the discomfort from the needle sticks.) First 1 hour: 100 % better. First 4-6 hours:100 % better. 2. How long did the numbness from the local anesthetics last? (More than 6 hours?) Duration: 36 hours.  3. How much better is your pain now, when compared to before the procedure? Current benefit:100 % better. 4. Can you move better now? Improvement in ROM (Range of Motion): Yes. 5. Can you do more now? Improvement in function: Yes. 4. Did you have any problems with the procedure? Side-effects/Complications: No.

## 2019-12-04 NOTE — Progress Notes (Signed)
Patient: Jose Washington  Service Category: E/M  Provider: Gaspar Cola, MD  DOB: [birth year]  DOS: 12/05/2019  Location: Office  MRN: [id number]  Setting: Ambulatory outpatient  Referring Provider: Volney American,*  Type: Established Patient  Specialty: Interventional Pain Management  PCP: Volney American, PA-C  Location: Remote location  Delivery: TeleHealth     Virtual Encounter - Pain Management PROVIDER NOTE: Information contained herein reflects review and annotations entered in association with encounter. Interpretation of such information and data should be left to medically-trained personnel. Information provided to patient can be located elsewhere in the medical record under "Patient Instructions". Document created using STT-dictation technology, any transcriptional errors that may result from process are unintentional.    Contact & Pharmacy Preferred: [phone] Home: [phone] (home) Mobile: [phone] (mobile) E-mail: [email or web address]  [id number] [address], [place] [address] Phone: [phone] Fax: [phone]   Pre-screening  Jose Washington offered "in-person" vs "virtual" encounter. He indicated preferring virtual for this encounter.   Reason COVID-19*  Social distancing based on CDC and AMA recommendations.   I contacted Jose Washington on 12/05/2019 via telephone.      I clearly identified myself as Gaspar Cola, MD. I verified that I was speaking with the correct person using two identifiers (Name: Jose Washington, and date of birth: 20-Nov-1965).  Consent I sought verbal advanced consent from Jose Washington for virtual visit interactions. I informed Jose Washington of possible security and privacy concerns, risks, and limitations associated with providing "not-in-person" medical evaluation and management services. I also informed Jose Washington of the availability of "in-person"  appointments. Finally, I informed him that there would be a charge for the virtual visit and that he could be  personally, fully or partially, financially responsible for it. Jose Washington expressed understanding and agreed to proceed.   Historic Elements   Jose Washington is a 54 y.o. year old, male patient evaluated today after his last encounter by our practice on 11/23/2019. Jose Washington  has a past medical history of Allergy, Anxiety, Chronic duodenal ulcer with hemorrhage (2012), Chronic neck pain, Depression, Hyperlipidemia, Hypertension, and Microscopic hematuria. He also  has a past surgical history that includes eye muscle repair (1972 and 1975) and bLEEDING ULCER (2012). Jose Washington has a current medication list which includes the following prescription(s): acetaminophen, albuterol, atenolol, atorvastatin, baclofen, buspirone, calcium carbonate, vitamin d3, desloratadine-pseudoephedrine, diphenhydramine hcl, duloxetine, fenofibrate, fluticasone, glucosamine-chondroit-vit c-mn, hydrocodone-acetaminophen, [START ON 01/01/2020] hydrocodone-acetaminophen, [START ON 01/31/2020] hydrocodone-acetaminophen, hydroxyzine, ibuprofen, magnesium oxide, melatonin, methylsulfonylmethane, multivitamin, omeprazole, ondansetron, oxycodone-acetaminophen, potassium, simethicone, sucralfate, vitamin b-12, and benzonatate. He  reports that he has never smoked. He has never used smokeless tobacco. He reports that he does not drink alcohol or use drugs. Jose Washington has No Known Allergies.   HPI  Today, he is being contacted for a post-procedure assessment.  The patient indicates doing great after the lumbar facet radiofrequency and he would like to have something similar done in the neck area.  However, what we have done for him in the past this only the cervical epidural steroid injections on the left side.  He refers that he is not having any more left upper extremity pain and his primary pain right now is just on the  right side of the neck.  I explained to the patient that before we request a radiofrequency ablation we need to confirm that doing a cervical facet block would actually  help him.  He understood and accepted and he wants to proceed with the diagnostic injection.  I will go ahead and set that up for him as soon as possible.  Post-Procedure Evaluation  Procedure: Therapeutic right-sided lumbar facet RFA #1 under fluoroscopic guidance and IV sedation Pre-procedure pain level:  3/10 Post-procedure: 0/10 (100% relief)  Sedation: Sedation provided.  Landis Martins, RN  12/04/2019  9:55 AM  Sign when Signing Visit Pain relief after procedure (treated area only): (Questions asked to patient) 1. Starting about 15 minutes after the procedure, and "while the area was still numb" (from the local anesthetics), were you having any of your usual pain "in that area" (the treated area)?  (NOTE: NOT including the discomfort from the needle sticks.) First 1 hour: 100 % better. First 4-6 hours:100 % better. 2. How long did the numbness from the local anesthetics last? (More than 6 hours?) Duration: 36 hours.  3. How much better is your pain now, when compared to before the procedure? Current benefit:100 % better. 4. Can you move better now? Improvement in ROM (Range of Motion): Yes. 5. Can you do more now? Improvement in function: Yes. 4. Did you have any problems with the procedure? Side-effects/Complications: No.  Current benefits: Defined as benefit that persist at this time.   Analgesia:  90-100% better Function: Jose Washington reports improvement in function ROM: Jose Washington reports improvement in ROM  Pharmacotherapy Assessment  Analgesic: Hydrocodone/APAP 5/325, 1 tab PO QD (5 mg/day of hydrocodone) MME/day: 5 mg/day.   Monitoring: Pharmacotherapy: No side-effects or adverse reactions reported. [place] PMP: PDMP reviewed during this encounter.       Compliance: No problems identified. Effectiveness:  Clinically acceptable. Plan: Refer to "POC".  UDS:  Summary  Date Value Ref Range Status  07/13/2018 FINAL  Final    Comment:    ==================================================================== TOXASSURE SELECT 13 (MW) ==================================================================== Test                             Result       Flag       Units Drug Present and Declared for Prescription Verification   Hydrocodone                    29           EXPECTED   ng/mg creat   Norhydrocodone                 65           EXPECTED   ng/mg creat    Sources of hydrocodone include scheduled prescription    medications. Norhydrocodone is an expected metabolite of    hydrocodone. ==================================================================== Test                      Result    Flag   Units      Ref Range   Creatinine              309              mg/dL      >=20 ==================================================================== Declared Medications:  The flagging and interpretation on this report are based on the  following declared medications.  Unexpected results may arise from  inaccuracies in the declared medications.  **Note: The testing scope of this panel includes these medications:  Hydrocodone (Hydrocodone-Acetaminophen)  **Note: The testing scope of this panel does  not include following  reported medications:  Acetaminophen (Hydrocodone-Acetaminophen)  Acetaminophen (Tylenol)  Atenolol (Tenormin)  Atorvastatin (Lipitor)  Baclofen (Lioresal)  Buspirone (BuSpar)  Chondroitin  Chondroitin (Glucosamine-Chondroitin)  Cyanocobalamin  Desloratadine (Clarinex-D)  Duloxetine (Cymbalta)  Fenofibrate  Fluticasone (Flonase)  Glucosamine (Glucosamine-Chondroitin)  Ibuprofen  Magnesium Oxide  Melatonin  Methylsulfonylmethane  Multivitamin (MVI)  Omeprazole (Prilosec)  Ondansetron (Zofran)  Potassium  Pseudoephedrine (Clarinex-D)  Vitamin  D3 ==================================================================== For clinical consultation, please call [phone]. ====================================================================    Laboratory Chemistry Profile (12 mo)  Renal: 06/15/2019: BUN/Creatinine Ratio 14 08/14/2019: BUN 11; Creatinine, Ser 1.14  Lab Results  Component Value Date   GFRAA >60 08/14/2019   GFRNONAA >60 08/14/2019   Hepatic: 08/14/2019: Albumin 4.6 Lab Results  Component Value Date   AST 33 08/14/2019   ALT 38 08/14/2019   Other: No results found for requested labs within last 8760 hours.  Note: Above Lab results reviewed.  Imaging  DG PAIN CLINIC C-ARM 1-60 MIN NO REPORT Fluoro was used, but no Radiologist interpretation will be provided.  Please refer to "NOTES" tab for provider progress note.   Assessment  The primary encounter diagnosis was Cervicalgia (Left). Diagnoses of Cervical facet syndrome (HCC) (L), Chronic neck pain (Third area of Pain) (Bilateral) (L>R), Lumbar facet syndrome (Bilateral) (R>L), and Chronic low back pain (Primary Area of Pain) (Bilateral) (L>R) w/o sciatica  were also pertinent to this visit.  Plan of Care  Problem-specific:  No problem-specific Assessment & Plan notes found for this encounter.  I am having Jose Washington. Delia Chimes maintain his Methylsulfonylmethane (MSM PO), Melatonin, multivitamin, vitamin B-12, DiphenhydrAMINE HCl (DIPHEDRYL ALLERGY PO), Potassium, desloratadine-pseudoephedrine, Calcium Carbonate (CALCIUM 600 PO), ibuprofen, fluticasone, Glucosamine-Chondroit-Vit C-Mn (GLUCOSAMINE 1500 COMPLEX PO), magnesium oxide, Vitamin D3, omeprazole, simethicone, ondansetron, hydrOXYzine, fenofibrate, DULoxetine, atorvastatin, atenolol, busPIRone, sucralfate, baclofen, HYDROcodone-acetaminophen, benzonatate, acetaminophen, albuterol, HYDROcodone-acetaminophen, HYDROcodone-acetaminophen, and oxyCODONE-acetaminophen.  Pharmacotherapy (Medications Ordered): No  orders of the defined types were placed in this encounter.  Orders:  Orders Placed This Encounter  Procedures  . CERVICAL FACET (MEDIAL BRANCH NERVE BLOCK)     Standing Status:   Future    Standing Expiration Date:   01/02/2020    Scheduling Instructions:     Side: Left-sided     Level: C3-4, C4-5, C5-6 Facet joints (C3, C4, C5, C6, & C7 Medial Branch Nerves)     Sedation: With Sedation.     Timeframe: As soon as schedule allows    Order Specific Question:   Where will this procedure be performed?    Answer:   [facility]   Follow-up plan:   Return in about 12 weeks (around 02/27/2020) for (VV), (MM), in addition, Procedure (w/ sedation): (L) C-FCT Blk #1, (ASAP).      Interventional treatment options:  Under consideration: Possible left lumbar facet RFA  Diagnostic right cervical facetblock  Possible right cervical facet RFA  Diagnostic bilateral sacroiliac joint block Possible bilateral sacroiliac joint RFA Diagnostic trigger point injections   Therapeutic/palliative (PRN): Palliative right lumbar facet RFA #2 (last done 11/22/2019) Palliative right lumbar facetblock #3  Diagnostic/therapeutic left lumbar facetblock #2  Diagnostic/therapeutic left L4-5LESI#3 Palliative left CESI#4    Recent Visits Date Type Provider Dept  11/22/19 Procedure visit Milinda Pointer, MD [facility]  09/10/19 Telemedicine Milinda Pointer, MD [facility]  Showing recent visits within past 90 days and meeting all other requirements   Today's Visits Date Type Provider Dept  12/05/19 Telemedicine Milinda Pointer, MD [facility]  Showing today's visits  and meeting all other requirements   Future Appointments Date Type Provider Dept  12/26/19 Appointment Milinda Pointer, MD [facility]  Showing future appointments within next 90 days and meeting all other requirements   I discussed the assessment and treatment plan with  the patient. The patient was provided an opportunity to ask questions and all were answered. The patient agreed with the plan and demonstrated an understanding of the instructions.  Patient advised to call back or seek an in-person evaluation if the symptoms or condition worsens.  Duration of encounter: 17 minutes.  Note by: Gaspar Cola, MD Date: 12/05/2019; Time: 10:21 AM

## 2019-12-05 ENCOUNTER — Ambulatory Visit: Payer: 59 | Attending: Pain Medicine | Admitting: Pain Medicine

## 2019-12-05 ENCOUNTER — Other Ambulatory Visit: Payer: Self-pay

## 2019-12-05 DIAGNOSIS — M47816 Spondylosis without myelopathy or radiculopathy, lumbar region: Secondary | ICD-10-CM

## 2019-12-05 DIAGNOSIS — G8929 Other chronic pain: Secondary | ICD-10-CM | POA: Diagnosis not present

## 2019-12-05 DIAGNOSIS — M542 Cervicalgia: Secondary | ICD-10-CM

## 2019-12-05 DIAGNOSIS — M545 Low back pain: Secondary | ICD-10-CM | POA: Diagnosis not present

## 2019-12-05 DIAGNOSIS — M47812 Spondylosis without myelopathy or radiculopathy, cervical region: Secondary | ICD-10-CM

## 2019-12-10 ENCOUNTER — Telehealth: Payer: Self-pay

## 2019-12-10 NOTE — Telephone Encounter (Signed)
His insurance denied the cervical facets and he wants Dr. Dossie Arbour to do a peer to peer to try and get it approved. I will put the denial letter on Dr. Adalberto Cole desk with the phone number for [facility]

## 2019-12-12 ENCOUNTER — Encounter: Payer: Self-pay | Admitting: Family Medicine

## 2019-12-12 ENCOUNTER — Other Ambulatory Visit: Payer: Self-pay

## 2019-12-12 ENCOUNTER — Ambulatory Visit (INDEPENDENT_AMBULATORY_CARE_PROVIDER_SITE_OTHER): Payer: 59 | Admitting: Family Medicine

## 2019-12-12 VITALS — BP 124/80 | HR 62 | Temp 98.5°F | Ht 67.0 in | Wt 185.0 lb

## 2019-12-12 DIAGNOSIS — U071 COVID-19: Secondary | ICD-10-CM | POA: Diagnosis not present

## 2019-12-12 NOTE — Progress Notes (Signed)
   BP 124/80   Pulse 62   Temp 98.5 F (36.9 C) (Oral)   Ht 5\' 7"  (1.702 m)   Wt 185 lb (83.9 kg)   SpO2 98%   BMI 28.98 kg/m    Subjective:    Patient ID: Jose Washington, male    DOB: [birth year], 54 y.o.   MRN: [id number]  HPI: Jose Washington is a 54 y.o. male  Chief Complaint  Patient presents with  . Paperwork    for disability   Patient presenting today with paperwork for work to cover the period he was out for COVID 19 infection. Currently asymptomatic, no issues since quarantine period over. Was put out of work 12/15 and started back at work 1/2. Was tested at [facility], has paperwork from this with him.   Relevant past medical, surgical, family and social history reviewed and updated as indicated. Interim medical history since our last visit reviewed. Allergies and medications reviewed and updated.  Review of Systems  Per HPI unless specifically indicated above     Objective:    BP 124/80   Pulse 62   Temp 98.5 F (36.9 C) (Oral)   Ht 5\' 7"  (1.702 m)   Wt 185 lb (83.9 kg)   SpO2 98%   BMI 28.98 kg/m   Wt Readings from Last 3 Encounters:  12/12/19 185 lb (83.9 kg)  11/22/19 185 lb (83.9 kg)  10/16/19 187 lb (84.8 kg)    Physical Exam Vitals and nursing note reviewed.  Constitutional:      Appearance: Normal appearance.  HENT:     Head: Atraumatic.  Eyes:     Extraocular Movements: Extraocular movements intact.     Conjunctiva/sclera: Conjunctivae normal.  Cardiovascular:     Rate and Rhythm: Normal rate and regular rhythm.  Pulmonary:     Effort: Pulmonary effort is normal.     Breath sounds: Normal breath sounds.  Musculoskeletal:        General: Normal range of motion.     Cervical back: Normal range of motion and neck supple.  Skin:    General: Skin is warm and dry.  Neurological:     General: No focal deficit present.     Mental Status: He is oriented to person, place, and time.  Psychiatric:        Mood and Affect: Mood normal.      Thought Content: Thought content normal.        Judgment: Judgment normal.     Results for orders placed or performed during the hospital encounter of 10/16/19  POC SARS Coronavirus 2 Ag  Result Value Ref Range   SARS Coronavirus 2 Ag POSITIVE (A) NEGATIVE      Assessment & Plan:   Problem List Items Addressed This Visit    None    Visit Diagnoses    COVID-19 virus infection    -  Primary   Resolved and back at work. Paperwork completed, s canned into chart       Follow up plan: Return for as scheduled.

## 2019-12-18 ENCOUNTER — Other Ambulatory Visit: Payer: Self-pay | Admitting: Family Medicine

## 2019-12-19 NOTE — Telephone Encounter (Signed)
Requested medication (s) are due for refill today: yes  Requested medication (s) are on the active medication list: yes  Last refill:  11/15/2019  Future visit scheduled: yes  Notes to clinic:  this refill cannot be delegated    Requested Prescriptions  Pending Prescriptions Disp Refills   ondansetron (ZOFRAN) 8 MG tablet [Pharmacy Med Name: ONDANSETRON HCL 8 MG TABLET] 60 tablet 5    Sig: Take 1 tablet (8 mg total) by mouth 2 (two) times daily.      Not Delegated - Gastroenterology: Antiemetics Failed - 12/18/2019 10:57 PM      Failed - This refill cannot be delegated      Passed - Valid encounter within last 6 months    Recent Outpatient Visits           1 week ago COVID-19 virus infection   [place], [place], [place]   6 months ago Essential hypertension, benign   [place], [name], [place]   1 year ago Essential hypertension, benign   [place], [name], [place]   1 year ago Healthcare maintenance   [place], NP   1 year ago Essential hypertension, benign   [facility] Kathrine Haddock, NP       Future Appointments             In 2 days [facility], [name], [place], [place]   In 1 week Milinda Pointer, MD [place]   In 2 months Milinda Pointer, MD [place]

## 2019-12-19 NOTE — Telephone Encounter (Signed)
Patient last seen 06/16/19 and has appointment 12/21/19

## 2019-12-21 ENCOUNTER — Other Ambulatory Visit: Payer: Self-pay

## 2019-12-21 ENCOUNTER — Ambulatory Visit (INDEPENDENT_AMBULATORY_CARE_PROVIDER_SITE_OTHER): Payer: 59 | Admitting: Family Medicine

## 2019-12-21 ENCOUNTER — Encounter: Payer: Self-pay | Admitting: Family Medicine

## 2019-12-21 VITALS — BP 118/78 | HR 59 | Temp 98.4°F | Ht 67.0 in | Wt 184.0 lb

## 2019-12-21 DIAGNOSIS — Z0000 Encounter for general adult medical examination without abnormal findings: Secondary | ICD-10-CM

## 2019-12-21 DIAGNOSIS — E559 Vitamin D deficiency, unspecified: Secondary | ICD-10-CM | POA: Diagnosis not present

## 2019-12-21 DIAGNOSIS — F325 Major depressive disorder, single episode, in full remission: Secondary | ICD-10-CM

## 2019-12-21 DIAGNOSIS — I10 Essential (primary) hypertension: Secondary | ICD-10-CM

## 2019-12-21 DIAGNOSIS — M545 Low back pain: Secondary | ICD-10-CM

## 2019-12-21 DIAGNOSIS — F419 Anxiety disorder, unspecified: Secondary | ICD-10-CM | POA: Diagnosis not present

## 2019-12-21 DIAGNOSIS — E785 Hyperlipidemia, unspecified: Secondary | ICD-10-CM

## 2019-12-21 DIAGNOSIS — R69 Illness, unspecified: Secondary | ICD-10-CM | POA: Diagnosis not present

## 2019-12-21 DIAGNOSIS — Z1211 Encounter for screening for malignant neoplasm of colon: Secondary | ICD-10-CM

## 2019-12-21 DIAGNOSIS — G8929 Other chronic pain: Secondary | ICD-10-CM

## 2019-12-21 LAB — UA/M W/RFLX CULTURE, ROUTINE
Bilirubin, UA: NEGATIVE
Glucose, UA: NEGATIVE
Leukocytes,UA: NEGATIVE
Nitrite, UA: NEGATIVE
Protein,UA: NEGATIVE
RBC, UA: NEGATIVE
Specific Gravity, UA: 1.02 (ref 1.005–1.030)
Urobilinogen, Ur: 0.2 mg/dL (ref 0.2–1.0)
pH, UA: 7 (ref 5.0–7.5)

## 2019-12-21 MED ORDER — HYDROXYZINE HCL 25 MG PO TABS: 25.0000 mg | ORAL_TABLET | Freq: Three times a day (TID) | ORAL | 1 refills | Status: DC | PRN

## 2019-12-21 MED ORDER — FENOFIBRATE 160 MG PO TABS: 160.0000 mg | ORAL_TABLET | Freq: Every day | ORAL | 1 refills | Status: DC

## 2019-12-21 MED ORDER — ATENOLOL 50 MG PO TABS: 50.0000 mg | ORAL_TABLET | Freq: Every day | ORAL | 1 refills | Status: DC

## 2019-12-21 MED ORDER — SUCRALFATE 1 G PO TABS: 1.0000 g | ORAL_TABLET | Freq: Three times a day (TID) | ORAL | 1 refills | Status: DC

## 2019-12-21 MED ORDER — ALBUTEROL SULFATE HFA 108 (90 BASE) MCG/ACT IN AERS: INHALATION_SPRAY | RESPIRATORY_TRACT | 2 refills | Status: DC

## 2019-12-21 MED ORDER — BUSPIRONE HCL 30 MG PO TABS: 30.0000 mg | ORAL_TABLET | Freq: Two times a day (BID) | ORAL | 1 refills | Status: DC

## 2019-12-21 MED ORDER — ATORVASTATIN CALCIUM 40 MG PO TABS: 40.0000 mg | ORAL_TABLET | Freq: Every day | ORAL | 1 refills | Status: DC

## 2019-12-21 MED ORDER — OMEPRAZOLE 20 MG PO CPDR: DELAYED_RELEASE_CAPSULE | ORAL | 1 refills | Status: DC

## 2019-12-21 NOTE — Progress Notes (Signed)
BP 118/78   Pulse (!) 59   Temp 98.4 F (36.9 C) (Oral)   Ht 5\' 7"  (1.702 m)   Wt 184 lb (83.5 kg)   SpO2 99%   BMI 28.82 kg/m    Subjective:    Patient ID: Jose Washington, male    DOB: 02/10/1966, 54 y.o.   MRN: [id number]  HPI: Jose Washington is a 54 y.o. male presenting on 12/21/2019 for comprehensive medical examination. Current medical complaints include:see below  HTN - Taking medications faithfully without side effects. Denies CP, SOB, HAs,dizziness. Home BPs running around 120/80.  HLD - Taking lipitor and fenofibrate without issue. Denies myalgias, claudication. Trying to eat well and staying active.   Depression/ anxiety - stable for years on buspar, cymbalta regimen. Taking prn hydroxyzine additionally. Denies SI/HI, frequent panic attacks, mood swings.   Vit D deficiency - Taking vit d supplementation and trying to eat high vit d foods.   Chronic back pain - Followed by pain management, on opioid therapy and muscle relaxers with close monitoring  He currently lives with: Interim Problems from his last visit: no  Depression Screen done today and results listed below:  Depression screen [facility] 2/9 12/21/2019 06/07/2019 01/16/2019 12/28/2018 07/25/2018  Decreased Interest 0 0 0 0 0  Down, Depressed, Hopeless 0 0 0 0 0  PHQ - 2 Score 0 0 0 0 0  Altered sleeping 0 - 0 - -  Tired, decreased energy 0 - 0 - -  Change in appetite 0 - 0 - -  Feeling bad or failure about yourself  0 - 0 - -  Trouble concentrating 0 - 0 - -  Moving slowly or fidgety/restless 0 - 0 - -  Suicidal thoughts 0 - 0 - -  PHQ-9 Score 0 - 0 - -  Some recent data might be hidden    The patient does not have a history of falls. I did complete a risk assessment for falls. A plan of care for falls was documented.   Past Medical History:  Past Medical History:  Diagnosis Date  . Allergy   . Anxiety   . Chronic duodenal ulcer with hemorrhage 2012  . Chronic neck pain   . Depression   .  Hyperlipidemia   . Hypertension   . Microscopic hematuria     Surgical History:  Past Surgical History:  Procedure Laterality Date  . bLEEDING ULCER  2012  . eye muscle repair  1972 and 1975    Medications:  Current Outpatient Medications on File Prior to Visit  Medication Sig  . acetaminophen (TYLENOL) 500 MG tablet Take 1 tablet (500 mg total) by mouth every 6 (six) hours as needed.  . baclofen (LIORESAL) 10 MG tablet Take 1 tablet (10 mg total) by mouth at bedtime.  . Calcium Carbonate (CALCIUM 600 PO) Take 600 mg by mouth daily.  . Cholecalciferol (VITAMIN D3) 1000 units CAPS Take 1 capsule by mouth daily.  Marland Kitchen desloratadine-pseudoephedrine (CLARINEX-D 12-HOUR) 2.5-120 MG 12 hr tablet Take 1 tablet by mouth as needed.  . DiphenhydrAMINE HCl (DIPHEDRYL ALLERGY PO) Take 25 mg by mouth daily.  . DULoxetine (CYMBALTA) 30 MG capsule TAKE 1 CAPSULE (30 MG TOTAL) BY MOUTH 2 (TWO) TIMES DAILY.  . fluticasone (FLONASE) 50 MCG/ACT nasal spray Place 2 sprays into both nostrils 2 (two) times daily.  . Glucosamine-Chondroit-Vit C-Mn (GLUCOSAMINE 1500 COMPLEX PO) Take by mouth 2 (two) times daily.  Marland Kitchen HYDROcodone-acetaminophen (NORCO/VICODIN) 5-325 MG tablet Take 1  tablet by mouth daily as needed for severe pain. Must last 30 days.  Marland Kitchen ibuprofen (ADVIL,MOTRIN) 200 MG tablet Take 200 mg by mouth as needed (take 5 times a week).   . magnesium oxide (MAG-OX) 400 MG tablet Take 400 mg by mouth daily.  . Melatonin 5 MG TABS Take by mouth at bedtime.   . Methylsulfonylmethane (MSM PO) Take by mouth daily.   . Multiple Vitamin (MULTIVITAMIN) tablet Take 1 tablet by mouth daily.  . ondansetron (ZOFRAN) 8 MG tablet TAKE 1 TABLET (8 MG TOTAL) BY MOUTH 2 (TWO) TIMES DAILY.  Marland Kitchen Potassium 99 MG TABS Take 1 tablet by mouth.  . simethicone (MYLICON) 80 MG chewable tablet Chew 80 mg by mouth every 6 (six) hours as needed for flatulence.  . vitamin B-12 (CYANOCOBALAMIN) 1000 MCG tablet Take 1,000 mcg by mouth  daily.  Derrill Memo ON 01/01/2020] HYDROcodone-acetaminophen (NORCO/VICODIN) 5-325 MG tablet Take 1 tablet by mouth daily as needed for severe pain. Must last 30 days. (Patient not taking: Reported on 12/12/2019)  . [START ON 01/31/2020] HYDROcodone-acetaminophen (NORCO/VICODIN) 5-325 MG tablet Take 1 tablet by mouth daily as needed for severe pain. Must last 30 days. (Patient not taking: Reported on 12/12/2019)   No current facility-administered medications on file prior to visit.    Allergies:  No Known Allergies  Social History:  Social History   Socioeconomic History  . Marital status: Single    Spouse name: Not on file  . Number of children: Not on file  . Years of education: Not on file  . Highest education level: Not on file  Occupational History  . Not on file  Tobacco Use  . Smoking status: Never Smoker  . Smokeless tobacco: Never Used  Substance and Sexual Activity  . Alcohol use: No    Alcohol/week: 0.0 standard drinks  . Drug use: No  . Sexual activity: Never    Partners: Female  Other Topics Concern  . Not on file  Social History Narrative  . Not on file   Social Determinants of Health   Financial Resource Strain:   . Difficulty of Paying Living Expenses: Not on file  Food Insecurity:   . Worried About Charity fundraiser in the Last Year: Not on file  . Ran Out of Food in the Last Year: Not on file  Transportation Needs:   . Lack of Transportation (Medical): Not on file  . Lack of Transportation (Non-Medical): Not on file  Physical Activity:   . Days of Exercise per Week: Not on file  . Minutes of Exercise per Session: Not on file  Stress:   . Feeling of Stress : Not on file  Social Connections:   . Frequency of Communication with Friends and Family: Not on file  . Frequency of Social Gatherings with Friends and Family: Not on file  . Attends Religious Services: Not on file  . Active Member of Clubs or Organizations: Not on file  . Attends Theatre manager Meetings: Not on file  . Marital Status: Not on file  Intimate Partner Violence:   . Fear of Current or Ex-Partner: Not on file  . Emotionally Abused: Not on file  . Physically Abused: Not on file  . Sexually Abused: Not on file   Social History   Tobacco Use  Smoking Status Never Smoker  Smokeless Tobacco Never Used   Social History   Substance and Sexual Activity  Alcohol Use No  . Alcohol/week: 0.0 standard drinks  Family History:  Family History  Problem Relation Age of Onset  . Diabetes Mother   . Cancer Mother        breast- in remission  . Mental illness Father   . Depression Father   . Dementia Father        VASCULAR  . Allergies Brother   . Cancer Maternal Grandfather        colon  . Stroke Paternal Grandfather   . Dementia Paternal Grandfather        vascular    Past medical history, surgical history, medications, allergies, family history and social history reviewed with patient today and changes made to appropriate areas of the chart.   Review of Systems - General ROS: negative Psychological ROS: negative Ophthalmic ROS: negative ENT ROS: negative Allergy and Immunology ROS: negative Hematological and Lymphatic ROS: negative Endocrine ROS: negative Respiratory ROS: no cough, shortness of breath, or wheezing Cardiovascular ROS: no chest pain or dyspnea on exertion Gastrointestinal ROS: no abdominal pain, change in bowel habits, or black or bloody stools Genito-Urinary ROS: no dysuria, trouble voiding, or hematuria Musculoskeletal ROS: negative Neurological ROS: no TIA or stroke symptoms Dermatological ROS: negative All other ROS negative except what is listed above and in the HPI.      Objective:    BP 118/78   Pulse (!) 59   Temp 98.4 F (36.9 C) (Oral)   Ht 5\' 7"  (1.702 m)   Wt 184 lb (83.5 kg)   SpO2 99%   BMI 28.82 kg/m   Wt Readings from Last 3 Encounters:  12/21/19 184 lb (83.5 kg)  12/12/19 185 lb (83.9 kg)    11/22/19 185 lb (83.9 kg)    Physical Exam Vitals and nursing note reviewed.  Constitutional:      General: He is not in acute distress.    Appearance: He is well-developed.  HENT:     Head: Atraumatic.     Right Ear: Tympanic membrane and external ear normal.     Left Ear: Tympanic membrane and external ear normal.     Nose: Nose normal.     Mouth/Throat:     Mouth: Mucous membranes are moist.     Pharynx: Oropharynx is clear.  Eyes:     General: No scleral icterus.    Conjunctiva/sclera: Conjunctivae normal.     Pupils: Pupils are equal, round, and reactive to light.  Cardiovascular:     Rate and Rhythm: Normal rate and regular rhythm.     Heart sounds: Normal heart sounds. No murmur.  Pulmonary:     Effort: Pulmonary effort is normal. No respiratory distress.     Breath sounds: Normal breath sounds.  Abdominal:     General: Bowel sounds are normal. There is no distension.     Palpations: Abdomen is soft. There is no mass.     Tenderness: There is no abdominal tenderness. There is no guarding.  Genitourinary:    Comments: GU exam declined Musculoskeletal:        General: No tenderness. Normal range of motion.     Cervical back: Normal range of motion and neck supple.  Skin:    General: Skin is warm and dry.     Findings: No rash.  Neurological:     General: No focal deficit present.     Mental Status: He is alert and oriented to person, place, and time.     Deep Tendon Reflexes: Reflexes are normal and symmetric.  Psychiatric:  Mood and Affect: Mood normal.        Behavior: Behavior normal.        Thought Content: Thought content normal.        Judgment: Judgment normal.     Results for orders placed or performed in visit on 12/21/19  CBC with Differential/Platelet  Result Value Ref Range   WBC 6.7 3.4 - 10.8 x10E3/uL   RBC 5.09 4.14 - 5.80 x10E6/uL   Hemoglobin 15.6 13.0 - 17.7 g/dL   Hematocrit 46.5 37.5 - 51.0 %   MCV 91 79 - 97 fL   MCH 30.6 26.6  - 33.0 pg   MCHC 33.5 31.5 - 35.7 g/dL   RDW 13.0 11.6 - 15.4 %   Platelets 349 150 - 450 x10E3/uL   Neutrophils 62 Not Estab. %   Lymphs 27 Not Estab. %   Monocytes 7 Not Estab. %   Eos 2 Not Estab. %   Basos 1 Not Estab. %   Neutrophils Absolute 4.2 1.4 - 7.0 x10E3/uL   Lymphocytes Absolute 1.8 0.7 - 3.1 x10E3/uL   Monocytes Absolute 0.4 0.1 - 0.9 x10E3/uL   EOS (ABSOLUTE) 0.1 0.0 - 0.4 x10E3/uL   Basophils Absolute 0.0 0.0 - 0.2 x10E3/uL   Immature Granulocytes 1 Not Estab. %   Immature Grans (Abs) 0.0 0.0 - 0.1 x10E3/uL  Comprehensive metabolic panel  Result Value Ref Range   Glucose 79 65 - 99 mg/dL   BUN 20 6 - 24 mg/dL   Creatinine, Ser 1.22 0.76 - 1.27 mg/dL   GFR calc non Af Amer 67 >59 mL/min/1.73   GFR calc Af Amer 78 >59 mL/min/1.73   BUN/Creatinine Ratio 16 9 - 20   Sodium 140 134 - 144 mmol/L   Potassium 4.5 3.5 - 5.2 mmol/L   Chloride 100 96 - 106 mmol/L   CO2 24 20 - 29 mmol/L   Calcium 9.9 8.7 - 10.2 mg/dL   Total Protein 7.0 6.0 - 8.5 g/dL   Albumin 4.6 3.8 - 4.9 g/dL   Globulin, Total 2.4 1.5 - 4.5 g/dL   Albumin/Globulin Ratio 1.9 1.2 - 2.2   Bilirubin Total 0.6 0.0 - 1.2 mg/dL   Alkaline Phosphatase 61 39 - 117 IU/L   AST 27 0 - 40 IU/L   ALT 42 0 - 44 IU/L  Lipid Panel w/o Chol/HDL Ratio  Result Value Ref Range   Cholesterol, Total 233 (H) 100 - 199 mg/dL   Triglycerides 179 (H) 0 - 149 mg/dL   HDL 41 >39 mg/dL   VLDL Cholesterol Cal 33 5 - 40 mg/dL   LDL Chol Calc (NIH) 159 (H) 0 - 99 mg/dL  UA/M w/rflx Culture, Routine   Specimen: Urine   URINE  Result Value Ref Range   Specific Gravity, UA 1.020 1.005 - 1.030   pH, UA 7.0 5.0 - 7.5   Color, UA Yellow Yellow   Appearance Ur Clear Clear   Leukocytes,UA Negative Negative   Protein,UA Negative Negative/Trace   Glucose, UA Negative Negative   Ketones, UA 1+ (A) Negative   RBC, UA Negative Negative   Bilirubin, UA Negative Negative   Urobilinogen, Ur 0.2 0.2 - 1.0 mg/dL   Nitrite, UA  Negative Negative  VITAMIN D 25 Hydroxy (Vit-D Deficiency, Fractures)  Result Value Ref Range   Vit D, 25-Hydroxy 40.5 30.0 - 100.0 ng/mL      Assessment & Plan:   Problem List Items Addressed This Visit      Cardiovascular  and Mediastinum   Essential hypertension, benign - Primary    BPs stable and WNL, continue current regimen      Relevant Medications   atenolol (TENORMIN) 50 MG tablet   atorvastatin (LIPITOR) 40 MG tablet   fenofibrate 160 MG tablet   Other Relevant Orders   CBC with Differential/Platelet (Completed)   Comprehensive metabolic panel (Completed)   UA/M w/rflx Culture, Routine (Completed)     Other   Chronic low back pain (Primary Area of Pain) (Bilateral) (L>R) w/o sciatica  (Chronic)    Followed by Pain Clinic, continue per their recommendations      Hyperlipidemia    Recheck lipids, adjust as needed. Continue current regimen and good lifestyle habits      Relevant Medications   atenolol (TENORMIN) 50 MG tablet   atorvastatin (LIPITOR) 40 MG tablet   fenofibrate 160 MG tablet   Other Relevant Orders   Lipid Panel w/o Chol/HDL Ratio (Completed)   Vitamin D insufficiency    Recheck vit D, continue supplementation      Relevant Orders   VITAMIN D 25 Hydroxy (Vit-D Deficiency, Fractures) (Completed)   Major depression in remission ([place])    Stable and very well controlled on current regimen, continue present medications      Relevant Medications   busPIRone (BUSPAR) 30 MG tablet   hydrOXYzine (ATARAX/VISTARIL) 25 MG tablet   Anxiety    Stable and under good control. Continue current regimen      Relevant Medications   busPIRone (BUSPAR) 30 MG tablet   hydrOXYzine (ATARAX/VISTARIL) 25 MG tablet    Other Visit Diagnoses    Annual physical exam       Screening for colon cancer       Relevant Orders   Cologuard       Discussed aspirin prophylaxis for myocardial infarction prevention and decision was made to continue ASA  LABORATORY  TESTING:  Health maintenance labs ordered today as discussed above.   The natural history of prostate cancer and ongoing controversy regarding screening and potential treatment outcomes of prostate cancer has been discussed with the patient. The meaning of a false positive PSA and a false negative PSA has been discussed. He indicates understanding of the limitations of this screening test and wishes not to proceed with screening PSA testing.   IMMUNIZATIONS:   - Tdap: Tetanus vaccination status reviewed: last tetanus booster within 10 years. - Influenza: Up to date  SCREENING: - Colonoscopy: cologuard ordered  Discussed with patient purpose of the colonoscopy is to detect colon cancer at curable precancerous or early stages   PATIENT COUNSELING:    Sexuality: Discussed sexually transmitted diseases, partner selection, use of condoms, avoidance of unintended pregnancy  and contraceptive alternatives.   Advised to avoid cigarette smoking.  I discussed with the patient that most people either abstain from alcohol or drink within safe limits (<=14/week and <=4 drinks/occasion for males, <=7/weeks and <= 3 drinks/occasion for females) and that the risk for alcohol disorders and other health effects rises proportionally with the number of drinks per week and how often a drinker exceeds daily limits.  Discussed cessation/primary prevention of drug use and availability of treatment for abuse.   Diet: Encouraged to adjust caloric intake to maintain  or achieve ideal body weight, to reduce intake of dietary saturated fat and total fat, to limit sodium intake by avoiding high sodium foods and not adding table salt, and to maintain adequate dietary potassium and calcium preferably from fresh fruits, vegetables,  and low-fat dairy products.    stressed the importance of regular exercise  Injury prevention: Discussed safety belts, safety helmets, smoke detector, smoking near bedding or upholstery.    Dental health: Discussed importance of regular tooth brushing, flossing, and dental visits.   Follow up plan: NEXT PREVENTATIVE PHYSICAL DUE IN 1 YEAR. Return in about 6 months (around 06/19/2020) for 6 month f/u.

## 2019-12-22 LAB — CBC WITH DIFFERENTIAL/PLATELET
Basophils Absolute: 0 10*3/uL (ref 0.0–0.2)
Basos: 1 %
EOS (ABSOLUTE): 0.1 10*3/uL (ref 0.0–0.4)
Eos: 2 %
Hematocrit: 46.5 % (ref 37.5–51.0)
Hemoglobin: 15.6 g/dL (ref 13.0–17.7)
Immature Grans (Abs): 0 10*3/uL (ref 0.0–0.1)
Immature Granulocytes: 1 %
Lymphocytes Absolute: 1.8 10*3/uL (ref 0.7–3.1)
Lymphs: 27 %
MCH: 30.6 pg (ref 26.6–33.0)
MCHC: 33.5 g/dL (ref 31.5–35.7)
MCV: 91 fL (ref 79–97)
Monocytes Absolute: 0.4 10*3/uL (ref 0.1–0.9)
Monocytes: 7 %
Neutrophils Absolute: 4.2 10*3/uL (ref 1.4–7.0)
Neutrophils: 62 %
Platelets: 349 10*3/uL (ref 150–450)
RBC: 5.09 x10E6/uL (ref 4.14–5.80)
RDW: 13 % (ref 11.6–15.4)
WBC: 6.7 10*3/uL (ref 3.4–10.8)

## 2019-12-22 LAB — COMPREHENSIVE METABOLIC PANEL
ALT: 42 IU/L (ref 0–44)
AST: 27 IU/L (ref 0–40)
Albumin/Globulin Ratio: 1.9 (ref 1.2–2.2)
Albumin: 4.6 g/dL (ref 3.8–4.9)
Alkaline Phosphatase: 61 IU/L (ref 39–117)
BUN/Creatinine Ratio: 16 (ref 9–20)
BUN: 20 mg/dL (ref 6–24)
Bilirubin Total: 0.6 mg/dL (ref 0.0–1.2)
CO2: 24 mmol/L (ref 20–29)
Calcium: 9.9 mg/dL (ref 8.7–10.2)
Chloride: 100 mmol/L (ref 96–106)
Creatinine, Ser: 1.22 mg/dL (ref 0.76–1.27)
GFR calc Af Amer: 78 mL/min/{1.73_m2} (ref >59)
GFR calc non Af Amer: 67 mL/min/{1.73_m2} (ref >59)
Globulin, Total: 2.4 g/dL (ref 1.5–4.5)
Glucose: 79 mg/dL (ref 65–99)
Potassium: 4.5 mmol/L (ref 3.5–5.2)
Sodium: 140 mmol/L (ref 134–144)
Total Protein: 7 g/dL (ref 6.0–8.5)

## 2019-12-22 LAB — LIPID PANEL W/O CHOL/HDL RATIO
Cholesterol, Total: 233 mg/dL — ABNORMAL HIGH (ref 100–199)
HDL: 41 mg/dL (ref >39)
LDL Chol Calc (NIH): 159 mg/dL — ABNORMAL HIGH (ref 0–99)
Triglycerides: 179 mg/dL — ABNORMAL HIGH (ref 0–149)
VLDL Cholesterol Cal: 33 mg/dL (ref 5–40)

## 2019-12-22 LAB — VITAMIN D 25 HYDROXY (VIT D DEFICIENCY, FRACTURES): Vit D, 25-Hydroxy: 40.5 ng/mL (ref 30.0–100.0)

## 2019-12-24 NOTE — Assessment & Plan Note (Signed)
Stable and under good control. Continue current regimen

## 2019-12-24 NOTE — Assessment & Plan Note (Signed)
Recheck lipids, adjust as needed. Continue current regimen and good lifestyle habits 

## 2019-12-24 NOTE — Assessment & Plan Note (Signed)
Stable and very well controlled on current regimen, continue present medications

## 2019-12-24 NOTE — Assessment & Plan Note (Signed)
BPs stable and WNL, continue current regimen 

## 2019-12-24 NOTE — Assessment & Plan Note (Signed)
Followed by Pain Clinic, continue per their recommendations

## 2019-12-24 NOTE — Assessment & Plan Note (Signed)
Recheck vit D, continue supplementation

## 2019-12-25 ENCOUNTER — Telehealth: Payer: Self-pay | Admitting: *Deleted

## 2019-12-25 ENCOUNTER — Encounter: Payer: Self-pay | Admitting: Pain Medicine

## 2019-12-25 NOTE — Progress Notes (Signed)
Patient: Jose Washington  Service Category: E/M  Provider: Gaspar Cola, MD  DOB: 1966-07-19  DOS: 12/26/2019  Location: Office  MRN: [id number]  Setting: Ambulatory outpatient  Referring Provider: Volney American,*  Type: Established Patient  Specialty: Interventional Pain Management  PCP: Volney American, PA-C  Location: Remote location  Delivery: TeleHealth     Virtual Encounter - Pain Management PROVIDER NOTE: Information contained herein reflects review and annotations entered in association with encounter. Interpretation of such information and data should be left to medically-trained personnel. Information provided to patient can be located elsewhere in the medical record under "Patient Instructions". Document created using STT-dictation technology, any transcriptional errors that may result from process are unintentional.    Contact & Pharmacy Preferred: [phone] Home: [phone] (home) Mobile: [phone] (mobile) E-mail: [email or web address]  [id number] [id number]: [phone]: [phone]  Pre-screening  Mr. SGorumoffered "in-person" vs "virtual" encounter. He indicated preferring virtual for this encounter.   Reason COVID-19*  Social distancing based on CDC and AMA recommendations.   I contacted CLaural Roeson 12/26/2019 via telephone.      I clearly identified myself as FGaspar Cola MD. I verified that I was speaking with the correct person using two identifiers (Name: CYOAN SALLADE and date of birth: 703/24/1967.  Consent I sought verbal advanced consent from [name] virtual visit interactions. I informed Mr. SDunsonof possible security and privacy concerns, risks, and limitations associated with providing "not-in-person" medical evaluation and management services. I also informed Mr. SVantolof the availability of "in-person"  appointments. Finally, I informed him that there would be a charge for the virtual visit and that he could be  personally, fully or partially, financially responsible for it. Mr. SPocockexpressed understanding and agreed to proceed.   Historic Elements   Mr. CMUSTAF ANTONACCIis a 54y.o. year old, male patient evaluated today after his last contact with our practice on 12/25/2019. Mr. SGoffe has a past medical history of Allergy, Anxiety, Chronic duodenal ulcer with hemorrhage (2012), Chronic neck pain, Depression, Hyperlipidemia, Hypertension, and Microscopic hematuria. He also  has a past surgical history that includes eye muscle repair (1972 and 1975) and bLEEDING ULCER (2012). Mr. SXianghas a current medication list which includes the following prescription(s): acetaminophen, albuterol, atenolol, atorvastatin, baclofen, buspirone, calcium carbonate, vitamin d3, desloratadine-pseudoephedrine, diphenhydramine hcl, duloxetine, fenofibrate, fluticasone, glucosamine-chondroit-vit c-mn, [START ON 01/01/2020] hydrocodone-acetaminophen, [START ON 01/31/2020] hydrocodone-acetaminophen, [START ON 03/01/2020] hydrocodone-acetaminophen, hydroxyzine, ibuprofen, magnesium oxide, melatonin, methylsulfonylmethane, multivitamin, omeprazole, ondansetron, potassium, simethicone, sucralfate, and vitamin b-12. He  reports that he has never smoked. He has never used smokeless tobacco. He reports that he does not drink alcohol or use drugs. Mr. SGapinskihas No Known Allergies.   HPI  Today, he is being contacted for follow-up evaluation.  As it turns out, the diagnostic left-sided cervical facet block that we ordered was not approved by [employer] due to the fact that we were planning on providing some steroids for the patient so as to be able to determine if the condition was associated with an inflammatory process.  However, because the pre-approval guidelines regarding diagnostic injections with [employer] were written by someone  completely ignorant to interventional pain management, the decision was made to deny any diagnostic injections containing steroids.   Because of this guidelines, if the physician suspected that the pain is associated to the cervical  facets, now we have to first do a diagnostic cervical facet block with only local anesthetics and no steroids to determine if the pain goes away for the duration of the local anesthetics, in which case then they would confirm that it is a cervical facet syndrome.  Now, assuming that it is a cervical facet syndrome, the next step should be to determine if the pain is being sustained because we are dealing with an inflammatory process or a mechanical one.  This means that the second injection should then have steroids to see if the patient can get long-term benefit from the treatment with injectable steroids.  This information could have been obtained with the first injection if it had contained steroids to begin with.  In a similar manner, I could be here all day long explaining the lack of logic involved in the decision-making process of this [employer].  Unfortunately, this is again that we are being forced to play since they are the gatekeeper's.  Therefore, to be clear today I will be scheduling the patient to come back for a diagnostic left-sided cervical facet block under fluoroscopic guidance using only local anesthetics.  Of course, if they had approved my initial request, then we would not have had to have this second encounter and we would have already completed the treatment for this patient.  Again, a Secretary/administrator of time and resources based on decision-making of the [employer].  Based on the patient's history, symptoms, and previously conducted physical exam, it is my impression that this patient has a left cervical facet syndrome.  I also believe that it is medically necessary for him to undergo a diagnostic cervical facet block with the  intention of determining whether or not he may be a good candidate for radiofrequency ablation.  Due to [employer], this first diagnostic cervical facet block will be done with only local anesthetics.  However if it is my opinion as a board-certified expert on interventional pain management, that this insurance company has overstepped their boundaries and is now involved in medical decision making, which is highly illegal since as a corporation they do not have a medical license to go against decision making process of the treating physician.  Even if they could get around the issue of practicing medicine without a license, it would still be a malpractice to make decisions on a patient that they have not personally evaluated or treated.  It is just a matter of time until a good lawyer figures that out and drains them out of every penny they have.  Pharmacotherapy Assessment  Analgesic: Hydrocodone/APAP 5/325, 1 tab PO QD (5 mg/day of hydrocodone) MME/day: 5 mg/day.   Monitoring: [place] PMP: PDMP reviewed during this encounter.       Pharmacotherapy: No side-effects or adverse reactions reported. Compliance: No problems identified. Effectiveness: Clinically acceptable. Plan: Refer to "POC".  UDS:  Summary  Date Value Ref Range Status  07/13/2018 FINAL  Final    Comment:    ==================================================================== TOXASSURE SELECT 13 (MW) ==================================================================== Test                             Result       Flag       Units Drug Present and Declared for Prescription Verification   Hydrocodone                    29  EXPECTED   ng/mg creat   Norhydrocodone                 65           EXPECTED   ng/mg creat    Sources of hydrocodone include scheduled prescription    medications. Norhydrocodone is an expected metabolite of     hydrocodone. ==================================================================== Test                      Result    Flag   Units      Ref Range   Creatinine              309              mg/dL      >=20 ==================================================================== Declared Medications:  The flagging and interpretation on this report are based on the  following declared medications.  Unexpected results may arise from  inaccuracies in the declared medications.  **Note: The testing scope of this panel includes these medications:  Hydrocodone (Hydrocodone-Acetaminophen)  **Note: The testing scope of this panel does not include following  reported medications:  Acetaminophen (Hydrocodone-Acetaminophen)  Acetaminophen (Tylenol)  Atenolol (Tenormin)  Atorvastatin (Lipitor)  Baclofen (Lioresal)  Buspirone (BuSpar)  Chondroitin  Chondroitin (Glucosamine-Chondroitin)  Cyanocobalamin  Desloratadine (Clarinex-D)  Duloxetine (Cymbalta)  Fenofibrate  Fluticasone (Flonase)  Glucosamine (Glucosamine-Chondroitin)  Ibuprofen  Magnesium Oxide  Melatonin  Methylsulfonylmethane  Multivitamin (MVI)  Omeprazole (Prilosec)  Ondansetron (Zofran)  Potassium  Pseudoephedrine (Clarinex-D)  Vitamin D3 ==================================================================== For clinical consultation, please call [phone]. ====================================================================    Laboratory Chemistry Profile   Renal Lab Results  Component Value Date   BUN 20 12/21/2019   CREATININE 1.22 12/21/2019   BCR 16 12/21/2019   GFRAA 78 12/21/2019   GFRNONAA 67 12/21/2019    Hepatic Lab Results  Component Value Date   AST 27 12/21/2019   ALT 42 12/21/2019   ALBUMIN 4.6 12/21/2019   ALKPHOS 61 12/21/2019   LIPASE 29 08/14/2019    Electrolytes Lab Results  Component Value Date   NA 140 12/21/2019   K 4.5 12/21/2019   CL 100 12/21/2019   CALCIUM 9.9 12/21/2019   MG  2.0 11/12/2016    Bone Lab Results  Component Value Date   VD25OH 40.5 12/21/2019   25OHVITD1 23 (L) 11/12/2016   25OHVITD2 <1.0 11/12/2016   25OHVITD3 23 11/12/2016    Inflammation (CRP: Acute Phase) (ESR: Chronic Phase) Lab Results  Component Value Date   CRP 1.4 (H) 11/12/2016   ESRSEDRATE 3 11/12/2016      Note: Above Lab results reviewed.  Imaging  DG PAIN CLINIC C-ARM 1-60 MIN NO REPORT Fluoro was used, but no Radiologist interpretation will be provided.  Please refer to "NOTES" tab for provider progress note.  The patient's x-rays were reviewed and we have confirmed that a diagnostic x-ray of the cervical spine shows left C4-5, C5-6, C6-7 and C7-T1 uncovertebral spurring.  Assessment  The primary encounter diagnosis was Cervicalgia (Left). Diagnoses of Chronic pain syndrome, Cervical facet syndrome (HCC) (Left), and Cervical DDD (C4-5, C5-6, C6-7 and C7-T1) were also pertinent to this visit.  Plan of Care  Problem-specific:  Cervical facet syndrome (HCC) (Left) The patient has left-sided cervicalgia with documented x-ray evidence of left C4-5, C5-6, C6-7, C7-T1 uncovertebral spurring, likely to be responsible for his left-sided cervical facet syndrome.  He is having no radicular symptoms.  He does have increased pain with provocative maneuvers forcing  the cervical facet joints.  The plan is to do a diagnostic cervical facet block.  If this proves positive for pain coming from that area, then we will complete the series of 2 diagnostic injections, followed by radiofrequency ablation.  This patient has already been treated with RFA of the lumbar facets with excellent results.  We have already proven that this patient has facet disease that involved the lumbosacral region.  At this point we believe that it is also involving the cervical.  Mr. ZACHRY HOPFENSPERGER has a current medication list which includes the following long-term medication(s): albuterol, atenolol, atorvastatin,  baclofen, calcium carbonate, diphenhydramine hcl, duloxetine, fenofibrate, [START ON 01/01/2020] hydrocodone-acetaminophen, [START ON 01/31/2020] hydrocodone-acetaminophen, [START ON 03/01/2020] hydrocodone-acetaminophen, omeprazole, potassium, simethicone, and sucralfate.  Pharmacotherapy (Medications Ordered): Meds ordered this encounter  Medications  . HYDROcodone-acetaminophen (NORCO/VICODIN) 5-325 MG tablet    Sig: Take 1 tablet by mouth daily as needed for severe pain. Must last 30 days.    Dispense:  30 tablet    Refill:  0    Chronic Pain: STOP Act (Not applicable) Fill 1 day early if closed on refill date. Do not fill until: 03/01/2020. To last until: 03/31/2020. Avoid benzodiazepines within 8 hours of opioids   Orders:  Orders Placed This Encounter  Procedures  . CERVICAL FACET (MEDIAL BRANCH NERVE BLOCK)     Standing Status:   Future    Standing Expiration Date:   01/23/2020    Scheduling Instructions:     Side: Left-sided     Level: C3-4, C4-5, C5-6 Facet joints (C3, C4, C5, C6, & C7 Medial Branch Nerves)     Sedation: With Sedation.     Timeframe: As soon as schedule allows     NOTE: Diagnostic injection w/o STEROIDS    Order Specific Question:   Where will this procedure be performed?    Answer:   [facility]   Follow-up plan:   Return in about 3 months (around 03/31/2020) for (VV), (MM), in addition, Procedure (w/ sedation): (L) C-FCT Blk#1 (NO STEROIDS).      Interventional treatment options:  Under consideration: Possible left lumbar facet RFA  Diagnostic right cervical facetblock  Possible right cervical facet RFA  Diagnostic bilateral sacroiliac joint block Possible bilateral sacroiliac joint RFA Diagnostic trigger point injections   Therapeutic/palliative (PRN): Palliative right lumbar facet RFA #2 (last done 11/22/2019) Palliative right lumbar facetblock #3  Diagnostic/therapeutic left lumbar facetblock #2  Diagnostic/therapeutic left  [id number]#3 Palliative left CESI#4    Recent Visits Date Type Provider Dept  12/05/19 Telemedicine Milinda Pointer, MD [facility]  11/22/19 Procedure visit Milinda Pointer, MD [facility]  Showing recent visits within past 90 days and meeting all other requirements   Today's Visits Date Type Provider Dept  12/26/19 Telemedicine Milinda Pointer, MD [facility]  Showing today's visits and meeting all other requirements   Future Appointments Date Type Provider Dept  02/27/20 Appointment Milinda Pointer, MD [facility]  Showing future appointments within next 90 days and meeting all other requirements   I discussed the assessment and treatment plan with the patient. The patient was provided an opportunity to ask questions and all were answered. The patient agreed with the plan and demonstrated an understanding of the instructions.  Patient advised to call back or seek an in-person evaluation if the symptoms or condition worsens.  Duration of encounter: 15 minutes.  Note by: Gaspar Cola, MD Date: 12/26/2019; Time: 10:39 AM

## 2019-12-25 NOTE — Telephone Encounter (Signed)
Have attempted to call patient twice today for pre visit assessment. No answer. LVM.

## 2019-12-25 NOTE — Telephone Encounter (Signed)
Voicemail left with patient to please call re; appt tomorrow with Dr Dossie Arbour to review medications and procedure evaluation.

## 2019-12-26 ENCOUNTER — Other Ambulatory Visit: Payer: Self-pay

## 2019-12-26 ENCOUNTER — Ambulatory Visit: Payer: 59 | Attending: Pain Medicine | Admitting: Pain Medicine

## 2019-12-26 DIAGNOSIS — M542 Cervicalgia: Secondary | ICD-10-CM

## 2019-12-26 DIAGNOSIS — G894 Chronic pain syndrome: Secondary | ICD-10-CM | POA: Diagnosis not present

## 2019-12-26 DIAGNOSIS — M47812 Spondylosis without myelopathy or radiculopathy, cervical region: Secondary | ICD-10-CM

## 2019-12-26 DIAGNOSIS — M5030 Other cervical disc degeneration, unspecified cervical region: Secondary | ICD-10-CM | POA: Diagnosis not present

## 2019-12-26 MED ORDER — HYDROCODONE-ACETAMINOPHEN 5-325 MG PO TABS: 1.0000 | ORAL_TABLET | Freq: Every day | ORAL | 0 refills | Status: DC | PRN

## 2019-12-26 NOTE — Patient Instructions (Signed)

## 2019-12-26 NOTE — Assessment & Plan Note (Signed)
The patient has left-sided cervicalgia with documented x-ray evidence of left C4-5, C5-6, C6-7, C7-T1 uncovertebral spurring, likely to be responsible for his left-sided cervical facet syndrome.  He is having no radicular symptoms.  He does have increased pain with provocative maneuvers forcing the cervical facet joints.  The plan is to do a diagnostic cervical facet block.  If this proves positive for pain coming from that area, then we will complete the series of 2 diagnostic injections, followed by radiofrequency ablation.  This patient has already been treated with RFA of the lumbar facets with excellent results.  We have already proven that this patient has facet disease that involved the lumbosacral region.  At this point we believe that it is also involving the cervical.

## 2019-12-31 ENCOUNTER — Telehealth: Payer: 59 | Admitting: Pain Medicine

## 2020-01-08 NOTE — Telephone Encounter (Signed)
Find out why it was denied.

## 2020-01-10 ENCOUNTER — Ambulatory Visit (HOSPITAL_BASED_OUTPATIENT_CLINIC_OR_DEPARTMENT_OTHER): Payer: 59 | Admitting: Pain Medicine

## 2020-01-10 ENCOUNTER — Ambulatory Visit
Admission: RE | Admit: 2020-01-10 | Discharge: 2020-01-10 | Disposition: A | Discharge status: Home / Self Care | Payer: 59 | Source: Ambulatory Visit | Attending: Pain Medicine | Admitting: Pain Medicine

## 2020-01-10 ENCOUNTER — Other Ambulatory Visit: Payer: Self-pay

## 2020-01-10 ENCOUNTER — Encounter: Payer: Self-pay | Admitting: Pain Medicine

## 2020-01-10 VITALS — BP 128/82 | HR 62 | Temp 98.1°F | Resp 16 | Wt 184.0 lb

## 2020-01-10 DIAGNOSIS — M542 Cervicalgia: Secondary | ICD-10-CM | POA: Diagnosis not present

## 2020-01-10 DIAGNOSIS — M47812 Spondylosis without myelopathy or radiculopathy, cervical region: Secondary | ICD-10-CM | POA: Diagnosis not present

## 2020-01-10 DIAGNOSIS — M5030 Other cervical disc degeneration, unspecified cervical region: Secondary | ICD-10-CM

## 2020-01-10 MED ORDER — LIDOCAINE HCL 2 % IJ SOLN
20.0000 mL | Freq: Once | INTRAMUSCULAR | Status: AC
  Administered 2020-01-10: 400 mg
  Filled 2020-01-10: qty 40

## 2020-01-10 MED ORDER — ROPIVACAINE HCL 2 MG/ML IJ SOLN
9.0000 mL | Freq: Once | INTRAMUSCULAR | Status: AC
  Administered 2020-01-10: 9 mL via PERINEURAL
  Filled 2020-01-10: qty 10

## 2020-01-10 MED ORDER — LACTATED RINGERS IV SOLN
1000.0000 mL | Freq: Once | INTRAVENOUS | Status: AC
  Administered 2020-01-10: 1000 mL via INTRAVENOUS

## 2020-01-10 MED ORDER — DEXAMETHASONE SODIUM PHOSPHATE 10 MG/ML IJ SOLN: 10.0000 mg | Freq: Once | INTRAMUSCULAR | Status: DC

## 2020-01-10 MED ORDER — FENTANYL CITRATE (PF) 100 MCG/2ML IJ SOLN
25.0000 ug | INTRAMUSCULAR | Status: AC | PRN
  Administered 2020-01-10: 50 ug via INTRAVENOUS
  Filled 2020-01-10: qty 2

## 2020-01-10 MED ORDER — MIDAZOLAM HCL 5 MG/5ML IJ SOLN
1.0000 mg | INTRAMUSCULAR | Status: AC | PRN
  Administered 2020-01-10: 2 mg via INTRAVENOUS
  Filled 2020-01-10: qty 5

## 2020-01-10 NOTE — Patient Instructions (Addendum)

## 2020-01-10 NOTE — Progress Notes (Signed)
PROVIDER NOTE: Information contained herein reflects review and annotations entered in association with encounter. Interpretation of such information and data should be left to medically-trained personnel. Information provided to patient can be located elsewhere in the medical record under "Patient Instructions". Document created using STT-dictation technology, any transcriptional errors that may result from process are unintentional.    Patient: Jose Washington  Service Category: Procedure  Provider: Gaspar Cola, MD  DOB: 12-23-1965  DOS: 01/10/2020  Location: [facility]  MRN: [id number]  Setting: Ambulatory - outpatient  Referring Provider: Milinda Pointer, MD  Type: Established Patient  Specialty: Interventional Pain Management  PCP: Volney American, PA-C   Primary Reason for Visit: Interventional Pain Management Treatment. CC: Neck Pain (left side) and Hip Pain (right side )  Procedure:          Anesthesia, Analgesia, Anxiolysis:  Type: Cervical Facet Medial Branch Block(s)  #1  Primary Purpose: Diagnostic (NO STEROIDS)  Region: Posterolateral cervical spine Level: C3, C4, C5, C6, & C7 Medial Branch Level(s). Injecting these levels blocks the C3-4, C4-5, C5-6, and C6-7 cervical facet joints. Laterality: Left  Type: Moderate (Conscious) Sedation combined with Local Anesthesia Indication(s): Analgesia and Anxiety Route: Intravenous (IV) IV Access: Secured Sedation: Meaningful verbal contact was maintained at all times during the procedure  Local Anesthetic: Lidocaine 1-2%  Position: Prone with head of the table raised to facilitate breathing.   Indications: 1. Cervicalgia (Left)   2. Cervical facet syndrome (HCC) (Left)   3. Spondylosis without myelopathy or radiculopathy, cervical region   4. Cervical DDD (C4-5, C5-6, C6-7 and C7-T1)    Pain Score: Pre-procedure: 2 (hip is 4)/10 Post-procedure: 0-No pain/10   [employer] does not allow for diagnostic  procedures to be done with steroids, which is the dumbest, most stupid and less logical rule that any healthcare insurance company has ever come up with. Today we are following their rules, but simply because we are obligated. This rule demonstrates a profound lack of knowledge and understanding about interventional pain management.  Pre-op Assessment:  Jose Washington is a 54 y.o. (year old), male patient, seen today for interventional treatment. He  has a past surgical history that includes eye muscle repair (1972 and 1975) and bLEEDING ULCER (2012). Jose Washington has a current medication list which includes the following prescription(s): acetaminophen, albuterol, atenolol, atorvastatin, baclofen, buspirone, calcium carbonate, vitamin d3, desloratadine-pseudoephedrine, diphenhydramine hcl, duloxetine, fenofibrate, fluticasone, glucosamine-chondroit-vit c-mn, hydrocodone-acetaminophen, [START ON 01/31/2020] hydrocodone-acetaminophen, [START ON 03/01/2020] hydrocodone-acetaminophen, hydroxyzine, ibuprofen, magnesium oxide, melatonin, methylsulfonylmethane, multivitamin, omeprazole, ondansetron, potassium, simethicone, sucralfate, and vitamin b-12. His primarily concern today is the Neck Pain (left side) and Hip Pain (right side )  Initial Vital Signs:  Pulse/HCG Rate: 62ECG Heart Rate: 66 Temp: 98.4 F (36.9 C) Resp: 16 BP: (!) 123/95 SpO2: 100 %  BMI: Estimated body mass index is 28.82 kg/m as calculated from the following:   Height as of 12/21/19: 5\' 7"  (1.702 m).   Weight as of this encounter: 184 lb (83.5 kg).  Risk Assessment: Allergies: Reviewed. He has No Known Allergies.  Allergy Precautions: None required Coagulopathies: Reviewed. None identified.  Blood-thinner therapy: None at this time Active Infection(s): Reviewed. None identified. Jose Washington is afebrile  Site Confirmation: Jose Washington was asked to confirm the procedure and laterality before marking the site Procedure checklist:  Completed Consent: Before the procedure and under the influence of no sedative(s), amnesic(s), or anxiolytics, the patient was informed of the treatment options, risks and possible complications. To  fulfill our ethical and legal obligations, as recommended by the American Medical Association's Code of Ethics, I have informed the patient of my clinical impression; the nature and purpose of the treatment or procedure; the risks, benefits, and possible complications of the intervention; the alternatives, including doing nothing; the risk(s) and benefit(s) of the alternative treatment(s) or procedure(s); and the risk(s) and benefit(s) of doing nothing. The patient was provided information about the general risks and possible complications associated with the procedure. These may include, but are not limited to: failure to achieve desired goals, infection, bleeding, organ or nerve damage, allergic reactions, paralysis, and death. In addition, the patient was informed of those risks and complications associated to Spine-related procedures, such as failure to decrease pain; infection (i.e.: Meningitis, epidural or intraspinal abscess); bleeding (i.e.: epidural hematoma, subarachnoid hemorrhage, or any other type of intraspinal or peri-dural bleeding); organ or nerve damage (i.e.: Any type of peripheral nerve, nerve root, or spinal cord injury) with subsequent damage to sensory, motor, and/or autonomic systems, resulting in permanent pain, numbness, and/or weakness of one or several areas of the body; allergic reactions; (i.e.: anaphylactic reaction); and/or death. Furthermore, the patient was informed of those risks and complications associated with the medications. These include, but are not limited to: allergic reactions (i.e.: anaphylactic or anaphylactoid reaction(s)); adrenal axis suppression; blood sugar elevation that in diabetics may result in ketoacidosis or comma; water retention that in patients with history  of congestive heart failure may result in shortness of breath, pulmonary edema, and decompensation with resultant heart failure; weight gain; swelling or edema; medication-induced neural toxicity; particulate matter embolism and blood vessel occlusion with resultant organ, and/or nervous system infarction; and/or aseptic necrosis of one or more joints. Finally, the patient was informed that Medicine is not an exact science; therefore, there is also the possibility of unforeseen or unpredictable risks and/or possible complications that may result in a catastrophic outcome. The patient indicated having understood very clearly. We have given the patient no guarantees and we have made no promises. Enough time was given to the patient to ask questions, all of which were answered to the patient's satisfaction. Mr. Goodly has indicated that he wanted to continue with the procedure. Attestation: I, the ordering provider, attest that I have discussed with the patient the benefits, risks, side-effects, alternatives, likelihood of achieving goals, and potential problems during recovery for the procedure that I have provided informed consent. Date  Time: 01/10/2020 12:53 PM  Pre-Procedure Preparation:  Monitoring: As per clinic protocol. Respiration, ETCO2, SpO2, BP, heart rate and rhythm monitor placed and checked for adequate function Safety Precautions: Patient was assessed for positional comfort and pressure points before starting the procedure. Time-out: I initiated and conducted the "Time-out" before starting the procedure, as per protocol. The patient was asked to participate by confirming the accuracy of the "Time Out" information. Verification of the correct person, site, and procedure were performed and confirmed by me, the nursing staff, and the patient. "Time-out" conducted as per Joint Commission's Universal Protocol (UP.01.01.01). Time: 1341  Description of Procedure:          Laterality: Left Level:  C3, C4, C5, C6, & C7 Medial Branch Level(s). Area Prepped: Posterior Cervico-thoracic Region Prepping solution: DuraPrep (Iodine Povacrylex [0.7% available iodine] and Isopropyl Alcohol, 74% w/w) Safety Precautions: Aspiration looking for blood return was conducted prior to all injections. At no point did we inject any substances, as a needle was being advanced. Before injecting, the patient was told to immediately notify  me if he was experiencing any new onset of "ringing in the ears, or metallic taste in the mouth". No attempts were made at seeking any paresthesias. Safe injection practices and needle disposal techniques used. Medications properly checked for expiration dates. SDV (single dose vial) medications used. After the completion of the procedure, all disposable equipment used was discarded in the proper designated medical waste containers. Local Anesthesia: Protocol guidelines were followed. The patient was positioned over the fluoroscopy table. The area was prepped in the usual manner. The time-out was completed. The target area was identified using fluoroscopy. A 12-in long, straight, sterile hemostat was used with fluoroscopic guidance to locate the targets for each level blocked. Once located, the skin was marked with an approved surgical skin marker. Once all sites were marked, the skin (epidermis, dermis, and hypodermis), as well as deeper tissues (fat, connective tissue and muscle) were infiltrated with a small amount of a short-acting local anesthetic, loaded on a 10cc syringe with a 25G, 1.5-in  Needle. An appropriate amount of time was allowed for local anesthetics to take effect before proceeding to the next step. Local Anesthetic: Lidocaine 2.0% The unused portion of the local anesthetic was discarded in the proper designated containers. Technical explanation of process:  C3 Medial Branch Nerve Block (MBB): The target area for the C3 dorsal medial articular branch is the lateral concave  waist of the articular pillar of C3. Under fluoroscopic guidance, a Quincke needle was inserted until contact was made with os over the postero-lateral aspect of the articular pillar of C3 (target area). After negative aspiration for blood, 0.5 mL of the nerve block solution was injected without difficulty or complication. The needle was removed intact. C4 Medial Branch Nerve Block (MBB): The target area for the C4 dorsal medial articular branch is the lateral concave waist of the articular pillar of C4. Under fluoroscopic guidance, a Quincke needle was inserted until contact was made with os over the postero-lateral aspect of the articular pillar of C4 (target area). After negative aspiration for blood, 0.5 mL of the nerve block solution was injected without difficulty or complication. The needle was removed intact. C5 Medial Branch Nerve Block (MBB): The target area for the C5 dorsal medial articular branch is the lateral concave waist of the articular pillar of C5. Under fluoroscopic guidance, a Quincke needle was inserted until contact was made with os over the postero-lateral aspect of the articular pillar of C5 (target area). After negative aspiration for blood, 0.5 mL of the nerve block solution was injected without difficulty or complication. The needle was removed intact. C6 Medial Branch Nerve Block (MBB): The target area for the C6 dorsal medial articular branch is the lateral concave waist of the articular pillar of C6. Under fluoroscopic guidance, a Quincke needle was inserted until contact was made with os over the postero-lateral aspect of the articular pillar of C6 (target area). After negative aspiration for blood, 0.5 mL of the nerve block solution was injected without difficulty or complication. The needle was removed intact. C7 Medial Branch Nerve Block (MBB): The target for the C7 dorsal medial articular branch lies on the superior-medial tip of the C7 transverse process. Under fluoroscopic  guidance, a Quincke needle was inserted until contact was made with os over the postero-lateral aspect of the articular pillar of C7 (target area). After negative aspiration for blood, 0.5 mL of the nerve block solution was injected without difficulty or complication. The needle was removed intact. Procedural Needles: 22-gauge, 3.5-inch, Quincke  needles used for all levels. Nerve block solution: 0.2% PF-Ropivacaine           The unused portion of the solution was discarded in the proper designated containers.  Once the entire procedure was completed, the treated area was cleaned, making sure to leave some of the prepping solution back to take advantage of its long term bactericidal properties.  Anatomy Reference Guide:       Vitals:   01/10/20 1352 01/10/20 1401 01/10/20 1411 01/10/20 1421  BP: (!) 136/106 132/90 (!) 129/101 128/82  Pulse:      Resp: 20 18 20 16   Temp:  98 F (36.7 C)  98.1 F (36.7 C)  TempSrc:      SpO2: 96% 99% 98% 100%  Weight:        Start Time: 1341 hrs. End Time: 1350 hrs.  Imaging Guidance (Spinal):          Type of Imaging Technique: Fluoroscopy Guidance (Spinal) Indication(s): Assistance in needle guidance and placement for procedures requiring needle placement in or near specific anatomical locations not easily accessible without such assistance. Exposure Time: Please see nurses notes. Contrast: None used. Fluoroscopic Guidance: I was personally present during the use of fluoroscopy. "Tunnel Vision Technique" used to obtain the best possible view of the target area. Parallax error corrected before commencing the procedure. "Direction-depth-direction" technique used to introduce the needle under continuous pulsed fluoroscopy. Once target was reached, antero-posterior, oblique, and lateral fluoroscopic projection used confirm needle placement in all planes. Images permanently stored in EMR. Interpretation: No contrast injected. I personally interpreted the  imaging intraoperatively. Adequate needle placement confirmed in multiple planes. Permanent images saved into the patient's record.  Antibiotic Prophylaxis:   Anti-infectives (From admission, onward)   None     Indication(s): None identified  Post-operative Assessment:  Post-procedure Vital Signs:  Pulse/HCG Rate: 6262 Temp: 98.1 F (36.7 C) Resp: 16 BP: 128/82 SpO2: 100 %  EBL: None  Complications: No immediate post-treatment complications observed by team, or reported by patient.  Note: The patient tolerated the entire procedure well. A repeat set of vitals were taken after the procedure and the patient was kept under observation following institutional policy, for this type of procedure. Post-procedural neurological assessment was performed, showing return to baseline, prior to discharge. The patient was provided with post-procedure discharge instructions, including a section on how to identify potential problems. Should any problems arise concerning this procedure, the patient was given instructions to immediately contact us, at any time, without hesitation. In any case, we plan to contact the patient by telephone for a follow-up status report regarding this interventional procedure.  Comments:  No additional relevant information.  Plan of Care  Orders:  Orders Placed This Encounter  Procedures  . CERVICAL FACET (MEDIAL BRANCH NERVE BLOCK)     Scheduling Instructions:     Side: Left-sided     Level: C3-4, C4-5, C5-6 Facet joints (C3, C4, C5, C6, & C7 Medial Branch Nerves)     Sedation: With Sedation.     Timeframe: Today    Order Specific Question:   Where will this procedure be performed?    Answer:   [facility]  . [facility] C-ARM 1-60 MIN NO REPORT    Intraoperative interpretation by procedural physician at [place].    Standing Status:   Standing    Number of Occurrences:   1    Order Specific Question:   Reason for exam:    Answer:  Assistance in needle guidance and placement for procedures requiring needle placement in or near specific anatomical locations not easily accessible without such assistance.  . Informed Consent Details: Physician/Practitioner Attestation; Transcribe to consent form and obtain patient signature    Provider Attestation: I, Anza Dossie Arbour, MD, (Pain Management Specialist), the physician/practitioner, attest that I have discussed with the patient the benefits, risks, side effects, alternatives, likelihood of achieving goals and potential problems during recovery for the procedure that I have provided informed consent.    Scheduling Instructions:     Procedure: Left Cervical facet block under fluoroscopic guidance.     Indications: Chronic neck pain secondary to cervical facet syndrome     Note: Always confirm laterality of pain with Mr. Trochez, before procedure.     Transcribe to consent form and obtain patient signature.  . Provide equipment / supplies at bedside    Equipment required: Single use, disposable, "Block Tray"    Standing Status:   Standing    Number of Occurrences:   1    Order Specific Question:   Specify    Answer:   Block Tray   Chronic Opioid Analgesic:  Hydrocodone/APAP 5/325, 1 tab PO QD (5 mg/day of hydrocodone) MME/day: 5 mg/day.   Medications ordered for procedure: Meds ordered this encounter  Medications  . lidocaine (XYLOCAINE) 2 % (with pres) injection 400 mg  . lactated ringers infusion 1,000 mL  . midazolam (VERSED) 5 MG/5ML injection 1-2 mg    Make sure Flumazenil is available in the pyxis when using this medication. If oversedation occurs, administer 0.2 mg IV over 15 sec. If after 45 sec no response, administer 0.2 mg again over 1 min; may repeat at 1 min intervals; not to exceed 4 doses (1 mg)  . fentaNYL (SUBLIMAZE) injection 25-50 mcg    Make sure Narcan is available in the pyxis when using this medication. In the event of respiratory depression (RR<  8/min): Titrate NARCAN (naloxone) in increments of 0.1 to 0.2 mg IV at 2-3 minute intervals, until desired degree of reversal.  . ropivacaine (PF) 2 mg/mL (0.2%) (NAROPIN) injection 9 mL   Medications administered: We administered lidocaine, lactated ringers, midazolam, fentaNYL, and ropivacaine (PF) 2 mg/mL (0.2%).  See the medical record for exact dosing, route, and time of administration.  Follow-up plan:   Return in about 2 weeks (around 01/24/2020) for (VV), (PP).       Interventional treatment options:  Under consideration: Possible left lumbar facet RFA  Diagnostic left cervical facetblock  Possible left cervical facet RFA  Diagnostic bilateral sacroiliac joint block Possible bilateral sacroiliac joint RFA Diagnostic trigger point injections   Therapeutic/palliative (PRN): Palliative right lumbar facet RFA #2 (last done 11/22/2019) Palliative right lumbar facetblock #3  Diagnostic/therapeutic left lumbar facetblock #2  Diagnostic/therapeutic left [id number]#3 Palliative left CESI#4    Recent Visits Date Type Provider Dept  01/10/20 Procedure visit Milinda Pointer, MD [facility]  12/26/19 Telemedicine Milinda Pointer, [facility]  12/05/19 Telemedicine Milinda Pointer, MD [facility]  11/22/19 Procedure visit Milinda Pointer, MD [facility]  Showing recent visits within past 90 days and meeting all other requirements   Future Appointments Date Type Provider Dept  01/24/20 Appointment Milinda Pointer, MD [facility]  03/26/20 Appointment Milinda Pointer, MD [facility]  Showing future appointments within next 90 days and meeting all other requirements   Disposition: Discharge home  Discharge (Date  Time): 01/10/2020; 1425 hrs.   Primary Care  Physician: Volney American, PA-C Location: [facility] Note by: Gaspar Cola, MD Date:  01/10/2020; Time: 8:36 AM  Disclaimer:  Medicine is not an Chief Strategy Officer. The only guarantee in medicine is that nothing is guaranteed. It is important to note that the decision to proceed with this intervention was based on the information collected from the patient. The Data and conclusions were drawn from the patient's questionnaire, the interview, and the physical examination. Because the information was provided in large part by the patient, it cannot be guaranteed that it has not been purposely or unconsciously manipulated. Every effort has been made to obtain as much relevant data as possible for this evaluation. It is important to note that the conclusions that lead to this procedure are derived in large part from the available data. Always take into account that the treatment will also be dependent on availability of resources and existing treatment guidelines, considered by other Pain Management Practitioners as being common knowledge and practice, at the time of the intervention. For Medico-Legal purposes, it is also important to point out that variation in procedural techniques and pharmacological choices are the acceptable norm. The indications, contraindications, technique, and results of the above procedure should only be interpreted and judged by a Board-Certified Interventional Pain Specialist with extensive familiarity and expertise in the same exact procedure and technique.

## 2020-01-10 NOTE — Progress Notes (Signed)
Safety precautions to be maintained throughout the outpatient stay will include: orient to surroundings, keep bed in low position, maintain call bell within reach at all times, provide assistance with transfer out of bed and ambulation.  

## 2020-01-11 ENCOUNTER — Telehealth: Payer: Self-pay

## 2020-01-11 NOTE — Telephone Encounter (Signed)
Post procedure phone call. Patient states he is doing well.  

## 2020-01-22 ENCOUNTER — Encounter: Payer: Self-pay | Admitting: Pain Medicine

## 2020-01-22 NOTE — Progress Notes (Signed)
Pain relief after procedure (treated area only): (Questions asked to patient) 1. Starting about 15 minutes after the procedure, and "while the area was still numb" (from the local anesthetics), were you having any of your usual pain "in that area" (the treated area)?  (NOTE: NOT including the discomfort from the needle sticks.) First 1 hour: 100 % better. First 4-6 hours: 100 % better.   3. How much better is your pain now, when compared to before the procedure? Current benefit: 100 % better. 4. Can you move better now? Improvement in ROM (Range of Motion): Yes. 5. Can you do more now? Improvement in function: Yes. 4. Did you have any problems with the procedure? Side-effects/Complications: No.

## 2020-01-24 ENCOUNTER — Ambulatory Visit: Payer: 59 | Attending: Pain Medicine | Admitting: Pain Medicine

## 2020-01-24 ENCOUNTER — Other Ambulatory Visit: Payer: Self-pay

## 2020-01-24 DIAGNOSIS — G8929 Other chronic pain: Secondary | ICD-10-CM | POA: Diagnosis not present

## 2020-01-24 DIAGNOSIS — M47812 Spondylosis without myelopathy or radiculopathy, cervical region: Secondary | ICD-10-CM | POA: Diagnosis not present

## 2020-01-24 DIAGNOSIS — M542 Cervicalgia: Secondary | ICD-10-CM

## 2020-01-24 NOTE — Progress Notes (Signed)
Patient: Jose Washington  Service Category: E/M  Provider: Gaspar Cola, MD  DOB: July 23, 1966  DOS: 01/24/2020  Location: Office  MRN: [id number]  Setting: Ambulatory outpatient  Referring Provider: Volney American,*  Type: Established Patient  Specialty: Interventional Pain Management  PCP: Volney American, PA-C  Location: Remote location  Delivery: TeleHealth     Virtual Encounter - Pain Management PROVIDER NOTE: Information contained herein reflects review and annotations entered in association with encounter. Interpretation of such information and data should be left to medically-trained personnel. Information provided to patient can be located elsewhere in the medical record under "Patient Instructions". Document created using STT-dictation technology, any transcriptional errors that may result from process are unintentional.    Contact & Pharmacy Preferred: [phone] Home: [phone] (home) Mobile: [phone] (mobile) E-mail: [email or web address]  [facility] [place]: [phone]: [phone]  Pre-screening  Mr. SVencilloffered "in-person" vs "virtual" encounter. He indicated preferring virtual for this encounter.   Reason COVID-19*  Social distancing based on CDC and AMA recommendations.   I contacted CLaural Roeson 01/24/2020 via telephone.      I clearly identified myself as FGaspar Cola MD. I verified that I was speaking with the correct person using two identifiers (Name: CHASAN DOUSE and date of birth: [birth year].  Consent I sought verbal advanced consent from [name] virtual visit interactions. I informed Mr. SAmickof possible security and privacy concerns, risks, and limitations associated with providing "not-in-person" medical evaluation and management services. I also informed Mr. SDenkerof the availability of "in-person"  appointments. Finally, I informed him that there would be a charge for the virtual visit and that he could be  personally, fully or partially, financially responsible for it. Mr. SThayerexpressed understanding and agreed to proceed.   Historic Elements   Mr. CBLUE WINTHERis a 54y.o. year old, male patient evaluated today after his last contact with our practice on 01/11/2020. Mr. SDouthat has a past medical history of Allergy, Anxiety, Chronic duodenal ulcer with hemorrhage (2012), Chronic neck pain, Depression, Hyperlipidemia, Hypertension, and Microscopic hematuria. He also  has a past surgical history that includes eye muscle repair (1972 and 1975) and bLEEDING ULCER (2012). Mr. SCockerellhas a current medication list which includes the following prescription(s): acetaminophen, albuterol, atenolol, atorvastatin, baclofen, buspirone, calcium carbonate, vitamin d3, desloratadine-pseudoephedrine, diphenhydramine hcl, duloxetine, fenofibrate, fluticasone, glucosamine-chondroit-vit c-mn, hydrocodone-acetaminophen, [START ON 01/31/2020] hydrocodone-acetaminophen, [START ON 03/01/2020] hydrocodone-acetaminophen, hydroxyzine, ibuprofen, magnesium oxide, melatonin, methylsulfonylmethane, multivitamin, omeprazole, ondansetron, potassium, simethicone, sucralfate, and vitamin b-12. He  reports that he has never smoked. He has never used smokeless tobacco. He reports that he does not drink alcohol or use drugs. Mr. STreanorhas No Known Allergies.   HPI  Today, he is being contacted for a post-procedure assessment.  The patient indicates having attained 100% relief of the pain for the duration of the local anesthetic which surprisingly actually lasted for 2 weeks.  Of course, the relief that he attained was that of the left side of his neck.  Once the pain on the left side of the neck subsided, the patient was not able to realize that all along he was also experiencing pain on the right side, just not as intense as that  of the left side.  He again indicates having cracking and popping sensations when he moves the neck around and this seems to  accentuate the neck pain on the right side.  Today he has pointed out that he would like to have a diagnostic right-sided cervical facet block to see if that would also take care of the pain that he is experiencing on that side.  Based on the results of his recent left-sided diagnostic cervical facet block, it would seem that he is a perfect candidate for radiofrequency ablation.  As seen below, it can be appreciated that the cervical facet arthropathy that he has on the left side is very similar to that on the right side.      Post-Procedure Evaluation  Procedure (01/10/2020): Diagnostic left-sided cervical facet block (w/o steroids) #1 under fluoroscopic guidance and IV sedation Pre-procedure pain level:  2/10 Post-procedure: 0/10 (100% relief)  Sedation: Sedation provided.  Dewayne Shorter, RN  01/22/2020 10:37 AM  Signed Pain relief after procedure (treated area only): (Questions asked to patient) 1. Starting about 15 minutes after the procedure, and "while the area was still numb" (from the local anesthetics), were you having any of your usual pain "in that area" (the treated area)?  (NOTE: NOT including the discomfort from the needle sticks.) First 1 hour: 100 % better. First 4-6 hours: 100 % better.   3. How much better is your pain now, when compared to before the procedure? Current benefit: 100 % better. 4. Can you move better now? Improvement in ROM (Range of Motion): Yes. 5. Can you do more now? Improvement in function: Yes. 4. Did you have any problems with the procedure? Side-effects/Complications: No.  Current benefits: Defined as benefit that persist at this time.   Analgesia:  90-100% better Function: Mr. Khachatryan reports improvement in function ROM: Mr. Sargeant reports improvement in ROM   Interpretation: This would suggest the patient to be a  perfect candidate for radiofrequency ablation.  Pharmacotherapy Assessment  Analgesic: Hydrocodone/APAP 5/325, 1 tab PO QD (5 mg/day of hydrocodone) MME/day: 5 mg/day.   Monitoring: [place] PMP: PDMP reviewed during this encounter.       Pharmacotherapy: No side-effects or adverse reactions reported. Compliance: No problems identified. Effectiveness: Clinically acceptable. Plan: Refer to "POC".  UDS:  Summary  Date Value Ref Range Status  07/13/2018 FINAL  Final    Comment:    ==================================================================== TOXASSURE SELECT 13 (MW) ==================================================================== Test                             Result       Flag       Units Drug Present and Declared for Prescription Verification   Hydrocodone                    29           EXPECTED   ng/mg creat   Norhydrocodone                 65           EXPECTED   ng/mg creat    Sources of hydrocodone include scheduled prescription    medications. Norhydrocodone is an expected metabolite of    hydrocodone. ==================================================================== Test                      Result    Flag   Units      Ref Range   Creatinine              309  mg/dL      >=20 ==================================================================== Declared Medications:  The flagging and interpretation on this report are based on the  following declared medications.  Unexpected results may arise from  inaccuracies in the declared medications.  **Note: The testing scope of this panel includes these medications:  Hydrocodone (Hydrocodone-Acetaminophen)  **Note: The testing scope of this panel does not include following  reported medications:  Acetaminophen (Hydrocodone-Acetaminophen)  Acetaminophen (Tylenol)  Atenolol (Tenormin)  Atorvastatin (Lipitor)  Baclofen (Lioresal)  Buspirone (BuSpar)  Chondroitin  Chondroitin (Glucosamine-Chondroitin)   Cyanocobalamin  Desloratadine (Clarinex-D)  Duloxetine (Cymbalta)  Fenofibrate  Fluticasone (Flonase)  Glucosamine (Glucosamine-Chondroitin)  Ibuprofen  Magnesium Oxide  Melatonin  Methylsulfonylmethane  Multivitamin (MVI)  Omeprazole (Prilosec)  Ondansetron (Zofran)  Potassium  Pseudoephedrine (Clarinex-D)  Vitamin D3 ==================================================================== For clinical consultation, please call [phone]. ====================================================================    Laboratory Chemistry Profile   Renal Lab Results  Component Value Date   BUN 20 12/21/2019   CREATININE 1.22 12/21/2019   BCR 16 12/21/2019   GFRAA 78 12/21/2019   GFRNONAA 67 12/21/2019    Hepatic Lab Results  Component Value Date   AST 27 12/21/2019   ALT 42 12/21/2019   ALBUMIN 4.6 12/21/2019   ALKPHOS 61 12/21/2019   LIPASE 29 08/14/2019    Electrolytes Lab Results  Component Value Date   NA 140 12/21/2019   K 4.5 12/21/2019   CL 100 12/21/2019   CALCIUM 9.9 12/21/2019   MG 2.0 11/12/2016    Bone Lab Results  Component Value Date   VD25OH 40.5 12/21/2019   25OHVITD1 23 (L) 11/12/2016   25OHVITD2 <1.0 11/12/2016   25OHVITD3 23 11/12/2016    Inflammation (CRP: Acute Phase) (ESR: Chronic Phase) Lab Results  Component Value Date   CRP 1.4 (H) 11/12/2016   ESRSEDRATE 3 11/12/2016      Note: Above Lab results reviewed.  Imaging  DG PAIN CLINIC C-ARM 1-60 MIN NO REPORT Fluoro was used, but no Radiologist interpretation will be provided.  Please refer to "NOTES" tab for provider progress note.  Assessment  The primary encounter diagnosis was Cervical facet syndrome (Bilateral) (L>R). Diagnoses of Cervicalgia (Bilateral) (L>R), Cervical spondylosis, and Chronic neck pain (Third area of Pain) (Bilateral) (L>R) were also pertinent to this visit.  Plan of Care  Problem-specific:  No problem-specific Assessment & Plan notes found for this  encounter.  Mr. LARON BOORMAN has a current medication list which includes the following long-term medication(s): albuterol, atenolol, atorvastatin, baclofen, calcium carbonate, diphenhydramine hcl, duloxetine, fenofibrate, hydrocodone-acetaminophen, [START ON 01/31/2020] hydrocodone-acetaminophen, [START ON 03/01/2020] hydrocodone-acetaminophen, omeprazole, potassium, simethicone, and sucralfate.  Pharmacotherapy (Medications Ordered): No orders of the defined types were placed in this encounter.  Orders:  Orders Placed This Encounter  Procedures  . CERVICAL FACET (MEDIAL BRANCH NERVE BLOCK)     Standing Status:   Future    Standing Expiration Date:   02/24/2020    Scheduling Instructions:     Procedure: Diagnostic right-sided cervical facet block (without steroids)     Side: Right-sided     Level: C3-4, C4-5, C5-6 Facet joints (C3, C4, C5, C6, & C7 Medial Branch Nerves)     Sedation: With Sedation.     Timeframe: As soon as schedule allows    Order Specific Question:   Where will this procedure be performed?    Answer:   [facility]   Follow-up plan:   Return for Procedure (w/ sedation): (R) C-FCT BLK #1 (w/o STEROIDS).      Interventional  treatment options:  Under consideration: Possible left lumbar facet RFA  Diagnostic left cervical facetblock  Possible left cervical facet RFA  Diagnostic bilateral sacroiliac joint block Possible bilateral sacroiliac joint RFA Diagnostic trigger point injections   Therapeutic/palliative (PRN): Palliative right lumbar facet RFA #2 (last done 11/22/2019) Palliative right lumbar facetblock #3  Diagnostic/therapeutic left lumbar facetblock #2  Diagnostic/therapeutic left L4-5LESI#3 Palliative left CESI#4     Recent Visits Date Type Provider Dept  01/10/20 Procedure visit Milinda Pointer, MD [facility]  12/26/19 Telemedicine Milinda Pointer, [facility]  12/05/19 Telemedicine Milinda Pointer, MD [facility]  11/22/19 Procedure visit Milinda Pointer, MD [facility]  Showing recent visits within past 90 days and meeting all other requirements   Today's Visits Date Type Provider Dept  01/24/20 Telemedicine Milinda Pointer, MD [facility]  Showing today's visits and meeting all other requirements   Future Appointments Date Type Provider Dept  03/26/20 Appointment Milinda Pointer, MD [facility]  Showing future appointments within next 90 days and meeting all other requirements   I discussed the assessment and treatment plan with the patient. The patient was provided an opportunity to ask questions and all were answered. The patient agreed with the plan and demonstrated an understanding of the instructions.  Patient advised to call back or seek an in-person evaluation if the symptoms or condition worsens.  Duration of encounter: 15 minutes.  Note by: Gaspar Cola, MD Date: 01/24/2020; Time: 11:14 AM

## 2020-01-24 NOTE — Patient Instructions (Signed)
____________________________________________________________________________________________  Preparing for Procedure with Sedation  Procedure appointments are limited to planned procedures: . No Prescription Refills. . No disability issues will be discussed. . No medication changes will be discussed.  Instructions: . Oral Intake: Do not eat or drink anything for at least 3 hours prior to your procedure. (Exception: Blood Pressure Medication. See below.) . Transportation: Unless otherwise stated by your physician, you may drive yourself after the procedure. . Blood Pressure Medicine: Do not forget to take your blood pressure medicine with a sip of water the morning of the procedure. If your Diastolic (lower reading)is above 100 mmHg, elective cases will be cancelled/rescheduled. . Blood thinners: These will need to be stopped for procedures. Notify our staff if you are taking any blood thinners. Depending on which one you take, there will be specific instructions on how and when to stop it. . Diabetics on insulin: Notify the staff so that you can be scheduled 1st case in the morning. If your diabetes requires high dose insulin, take only  of your normal insulin dose the morning of the procedure and notify the staff that you have done so. . Preventing infections: Shower with an antibacterial soap the morning of your procedure. . Build-up your immune system: Take 1000 mg of Vitamin C with every meal (3 times a day) the day prior to your procedure. . Antibiotics: Inform the staff if you have a condition or reason that requires you to take antibiotics before dental procedures. . Pregnancy: If you are pregnant, call and cancel the procedure. . Sickness: If you have a cold, fever, or any active infections, call and cancel the procedure. . Arrival: You must be in the facility at least 30 minutes prior to your scheduled procedure. . Children: Do not bring children with you. . Dress appropriately:  Bring dark clothing that you would not mind if they get stained. . Valuables: Do not bring any jewelry or valuables.  Reasons to call and reschedule or cancel your procedure: (Following these recommendations will minimize the risk of a serious complication.) . Surgeries: Avoid having procedures within 2 weeks of any surgery. (Avoid for 2 weeks before or after any surgery). . Flu Shots: Avoid having procedures within 2 weeks of a flu shots or . (Avoid for 2 weeks before or after immunizations). . Barium: Avoid having a procedure within 7-10 days after having had a radiological study involving the use of radiological contrast. (Myelograms, Barium swallow or enema study). . Heart attacks: Avoid any elective procedures or surgeries for the initial 6 months after a "Myocardial Infarction" (Heart Attack). . Blood thinners: It is imperative that you stop these medications before procedures. Let us know if you if you take any blood thinner.  . Infection: Avoid procedures during or within two weeks of an infection (including chest colds or gastrointestinal problems). Symptoms associated with infections include: Localized redness, fever, chills, night sweats or profuse sweating, burning sensation when voiding, cough, congestion, stuffiness, runny nose, sore throat, diarrhea, nausea, vomiting, cold or Flu symptoms, recent or current infections. It is specially important if the infection is over the area that we intend to treat. . Heart and lung problems: Symptoms that may suggest an active cardiopulmonary problem include: cough, chest pain, breathing difficulties or shortness of breath, dizziness, ankle swelling, uncontrolled high or unusually low blood pressure, and/or palpitations. If you are experiencing any of these symptoms, cancel your procedure and contact your primary care physician for an evaluation.  Remember:  Regular Business hours are:    Monday to Thursday 8:00 AM to 4:00 PM  Provider's  Schedule: Eloina Ergle, MD:  Procedure days: Tuesday and Thursday 7:30 AM to 4:00 PM  Bilal Lateef, MD:  Procedure days: Monday and Wednesday 7:30 AM to 4:00 PM ____________________________________________________________________________________________    

## 2020-01-29 ENCOUNTER — Telehealth: Payer: Self-pay | Admitting: *Deleted

## 2020-02-27 ENCOUNTER — Telehealth: Payer: 59 | Admitting: Pain Medicine

## 2020-02-28 ENCOUNTER — Ambulatory Visit: Payer: 59 | Admitting: Pain Medicine

## 2020-03-13 ENCOUNTER — Other Ambulatory Visit: Payer: Self-pay

## 2020-03-13 ENCOUNTER — Ambulatory Visit
Admission: RE | Admit: 2020-03-13 | Discharge: 2020-03-13 | Disposition: A | Discharge status: Home / Self Care | Payer: 59 | Source: Ambulatory Visit | Attending: Pain Medicine | Admitting: Pain Medicine

## 2020-03-13 ENCOUNTER — Ambulatory Visit (HOSPITAL_BASED_OUTPATIENT_CLINIC_OR_DEPARTMENT_OTHER): Payer: 59 | Admitting: Pain Medicine

## 2020-03-13 ENCOUNTER — Encounter: Payer: Self-pay | Admitting: Pain Medicine

## 2020-03-13 VITALS — BP 133/92 | HR 104 | Temp 97.9°F | Resp 20 | Ht 67.0 in | Wt 185.0 lb

## 2020-03-13 DIAGNOSIS — M5030 Other cervical disc degeneration, unspecified cervical region: Secondary | ICD-10-CM

## 2020-03-13 DIAGNOSIS — M542 Cervicalgia: Secondary | ICD-10-CM

## 2020-03-13 DIAGNOSIS — M47812 Spondylosis without myelopathy or radiculopathy, cervical region: Secondary | ICD-10-CM | POA: Diagnosis not present

## 2020-03-13 MED ORDER — ROPIVACAINE HCL 2 MG/ML IJ SOLN
9.0000 mL | Freq: Once | INTRAMUSCULAR | Status: AC
  Administered 2020-03-13: 10 mL via PERINEURAL
  Filled 2020-03-13: qty 10

## 2020-03-13 MED ORDER — MIDAZOLAM HCL 5 MG/5ML IJ SOLN
1.0000 mg | INTRAMUSCULAR | Status: DC | PRN
  Administered 2020-03-13: 3 mg via INTRAVENOUS
  Filled 2020-03-13: qty 5

## 2020-03-13 MED ORDER — LIDOCAINE HCL 2 % IJ SOLN
20.0000 mL | Freq: Once | INTRAMUSCULAR | Status: AC
  Administered 2020-03-13: 400 mg
  Filled 2020-03-13: qty 40

## 2020-03-13 MED ORDER — LACTATED RINGERS IV SOLN
1000.0000 mL | Freq: Once | INTRAVENOUS | Status: AC
  Administered 2020-03-13: 1000 mL via INTRAVENOUS

## 2020-03-13 MED ORDER — FENTANYL CITRATE (PF) 100 MCG/2ML IJ SOLN
25.0000 ug | INTRAMUSCULAR | Status: DC | PRN
  Administered 2020-03-13: 50 ug via INTRAVENOUS
  Filled 2020-03-13: qty 2

## 2020-03-13 NOTE — Patient Instructions (Signed)
____________________________________________________________________________________________  Post-Procedure Discharge Instructions  Instructions: Apply ice:  Purpose: This will minimize any swelling and discomfort after procedure.  When: Day of procedure, as soon as you get home. How: Fill a plastic sandwich bag with crushed ice. Cover it with a small towel and apply to injection site. How long: (15 min on, 15 min off) Apply for 15 minutes then remove x 15 minutes.  Repeat sequence on day of procedure, until you go to bed. Apply heat:  Purpose: To treat any soreness and discomfort from the procedure. When: Starting the next day after the procedure. How: Apply heat to procedure site starting the day following the procedure. How long: May continue to repeat daily, until discomfort goes away. Food intake: Start with clear liquids (like water) and advance to regular food, as tolerated.  Physical activities: Keep activities to a minimum for the first 8 hours after the procedure. After that, then as tolerated. Driving: If you have received any sedation, be responsible and do not drive. You are not allowed to drive for 24 hours after having sedation. Blood thinner: (Applies only to those taking blood thinners) You may restart your blood thinner 6 hours after your procedure. Insulin: (Applies only to Diabetic patients taking insulin) As soon as you can eat, you may resume your normal dosing schedule. Infection prevention: Keep procedure site clean and dry. Shower daily and clean area with soap and water. Post-procedure Pain Diary: Extremely important that this be done correctly and accurately. Recorded information will be used to determine the next step in treatment. For the purpose of accuracy, follow these rules: Evaluate only the area treated. Do not report or include pain from an untreated area. For the purpose of this evaluation, ignore all other areas of pain, except for the treated  area. After your procedure, avoid taking a long nap and attempting to complete the pain diary after you wake up. Instead, set your alarm clock to go off every hour, on the hour, for the initial 8 hours after the procedure. Document the duration of the numbing medicine, and the relief you are getting from it. Do not go to sleep and attempt to complete it later. It will not be accurate. If you received sedation, it is likely that you were given a medication that may cause amnesia. Because of this, completing the diary at a later time may cause the information to be inaccurate. This information is needed to plan your care. Follow-up appointment: Keep your post-procedure follow-up evaluation appointment after the procedure (usually 2 weeks for most procedures, 6 weeks for radiofrequencies). DO NOT FORGET to bring you pain diary with you.   Expect: (What should I expect to see with my procedure?) From numbing medicine (AKA: Local Anesthetics): Numbness or decrease in pain. You may also experience some weakness, which if present, could last for the duration of the local anesthetic. Onset: Full effect within 15 minutes of injected. Duration: It will depend on the type of local anesthetic used. On the average, 1 to 8 hours.  From procedure: Some discomfort is to be expected once the numbing medicine wears off. This should be minimal if ice and heat are applied as instructed.  Call if: (When should I call?) You experience numbness and weakness that gets worse with time, as opposed to wearing off. New onset bowel or bladder incontinence. (Applies only to procedures done in the spine)  Emergency Numbers: Durning business hours (Monday - Thursday, 8:00 AM - 4:00 PM) (Friday, 9:00 AM -   12:00 Noon): [phone] After hours: [phone] NOTE: If you are having a problem and are unable connect with, or to talk to a provider, then go to your nearest urgent care or emergency department. If the problem is  serious and urgent, please call 911. ____________________________________________________________________________________________    

## 2020-03-13 NOTE — Progress Notes (Signed)
Safety precautions to be maintained throughout the outpatient stay will include: orient to surroundings, keep bed in low position, maintain call bell within reach at all times, provide assistance with transfer out of bed and ambulation.  

## 2020-03-13 NOTE — Progress Notes (Signed)
PROVIDER NOTE: Information contained herein reflects review and annotations entered in association with encounter. Interpretation of such information and data should be left to medically-trained personnel. Information provided to patient can be located elsewhere in the medical record under "Patient Instructions". Document created using STT-dictation technology, any transcriptional errors that may result from process are unintentional.    Patient: Jose Washington  Service Category: Procedure  Provider: Gaspar Cola, MD  DOB: 12-20-1965  DOS: 03/13/2020  Location: [facility]  MRN: [id number]  Setting: Ambulatory - outpatient  Referring Provider: Volney American,*  Type: Established Patient  Specialty: Interventional Pain Management  PCP: Volney American, PA-C   Primary Reason for Visit: Interventional Pain Management Treatment. CC: Neck Pain (right side) and Back Pain (low)  Procedure:          Anesthesia, Analgesia, Anxiolysis:  Type: Cervical Facet Medial Branch Block(s)  #1 (without steroids)  Primary Purpose: Diagnostic Region: Posterolateral cervical spine Level: C3, C4, C5, C6, & C7 Medial Branch Level(s). Injecting these levels blocks the C3-4, C4-5, C5-6, and C6-7 cervical facet joints. Laterality: Right  Type: Moderate (Conscious) Sedation combined with Local Anesthesia Indication(s): Analgesia and Anxiety Route: Intravenous (IV) IV Access: Secured Sedation: Meaningful verbal contact was maintained at all times during the procedure  Local Anesthetic: Lidocaine 1-2%  Position: Prone with head of the table raised to facilitate breathing.   Indications: 1. Cervical facet syndrome (Bilateral) (L>R)   2. Cervicalgia (Bilateral) (L>R)   3. Cervical spondylosis   4. Cervical DDD (C4-5, C5-6, C6-7 and C7-T1)    Pain Score: Pre-procedure: 2 /10 Post-procedure: 0-No pain/10   Pre-op Assessment:  [name] is a 54 y.o. (year old), male patient,  seen today for interventional treatment. He  has a past surgical history that includes eye muscle repair (1972 and 1975) and bLEEDING ULCER (2012). [name] has a current medication list which includes the following prescription(s): acetaminophen, albuterol, atenolol, atorvastatin, baclofen, buspirone, calcium carbonate, vitamin d3, desloratadine-pseudoephedrine, diphenhydramine hcl, duloxetine, fenofibrate, fluticasone, glucosamine-chondroit-vit c-mn, hydrocodone-acetaminophen, hydroxyzine, ibuprofen, magnesium oxide, melatonin, multivitamin, omeprazole, ondansetron, potassium, simethicone, sucralfate, vitamin b-12, hydrocodone-acetaminophen, hydrocodone-acetaminophen, and methylsulfonylmethane, and the following Facility-Administered Medications: fentanyl and midazolam. His primarily concern today is the Neck Pain (right side) and Back Pain (low)  Initial Vital Signs:  Pulse/HCG Rate: (!) 104ECG Heart Rate: 99 Temp: 97.9 F (36.6 C) Resp: 18 BP: (!) 136/98 SpO2: 98 %  BMI: Estimated body mass index is 28.98 kg/m as calculated from the following:   Height as of this encounter: 5\' 7"  (1.702 m).   Weight as of this encounter: 185 lb (83.9 kg).  Risk Assessment: Allergies: Reviewed. He has No Known Allergies.  Allergy Precautions: None required Coagulopathies: Reviewed. None identified.  Blood-thinner therapy: None at this time Active Infection(s): Reviewed. None identified. [name] is afebrile  Site Confirmation: [name] was asked to confirm the procedure and laterality before marking the site Procedure checklist: Completed Consent: Before the procedure and under the influence of no sedative(s), amnesic(s), or anxiolytics, the patient was informed of the treatment options, risks and possible complications. To fulfill our ethical and legal obligations, as recommended by the American Medical Association's Code of Ethics, I have informed the patient of my clinical impression; the  nature and purpose of the treatment or procedure; the risks, benefits, and possible complications of the intervention; the alternatives, including doing nothing; the risk(s) and benefit(s) of the alternative treatment(s) or procedure(s); and the risk(s) and benefit(s) of doing nothing.  The patient was provided information about the general risks and possible complications associated with the procedure. These may include, but are not limited to: failure to achieve desired goals, infection, bleeding, organ or nerve damage, allergic reactions, paralysis, and death. In addition, the patient was informed of those risks and complications associated to Spine-related procedures, such as failure to decrease pain; infection (i.e.: Meningitis, epidural or intraspinal abscess); bleeding (i.e.: epidural hematoma, subarachnoid hemorrhage, or any other type of intraspinal or peri-dural bleeding); organ or nerve damage (i.e.: Any type of peripheral nerve, nerve root, or spinal cord injury) with subsequent damage to sensory, motor, and/or autonomic systems, resulting in permanent pain, numbness, and/or weakness of one or several areas of the body; allergic reactions; (i.e.: anaphylactic reaction); and/or death. Furthermore, the patient was informed of those risks and complications associated with the medications. These include, but are not limited to: allergic reactions (i.e.: anaphylactic or anaphylactoid reaction(s)); adrenal axis suppression; blood sugar elevation that in diabetics may result in ketoacidosis or comma; water retention that in patients with history of congestive heart failure may result in shortness of breath, pulmonary edema, and decompensation with resultant heart failure; weight gain; swelling or edema; medication-induced neural toxicity; particulate matter embolism and blood vessel occlusion with resultant organ, and/or nervous system infarction; and/or aseptic necrosis of one or more joints. Finally, the  patient was informed that Medicine is not an exact science; therefore, there is also the possibility of unforeseen or unpredictable risks and/or possible complications that may result in a catastrophic outcome. The patient indicated having understood very clearly. We have given the patient no guarantees and we have made no promises. Enough time was given to the patient to ask questions, all of which were answered to the patient's satisfaction. Mr. Jeanfreau has indicated that he wanted to continue with the procedure. Attestation: I, the ordering provider, attest that I have discussed with the patient the benefits, risks, side-effects, alternatives, likelihood of achieving goals, and potential problems during recovery for the procedure that I have provided informed consent. Date  Time: 03/13/2020 12:40 PM  Pre-Procedure Preparation:  Monitoring: As per clinic protocol. Respiration, ETCO2, SpO2, BP, heart rate and rhythm monitor placed and checked for adequate function Safety Precautions: Patient was assessed for positional comfort and pressure points before starting the procedure. Time-out: I initiated and conducted the "Time-out" before starting the procedure, as per protocol. The patient was asked to participate by confirming the accuracy of the "Time Out" information. Verification of the correct person, site, and procedure were performed and confirmed by me, the nursing staff, and the patient. "Time-out" conducted as per Joint Commission's Universal Protocol (UP.01.01.01). Time: 1321  Description of Procedure:          Laterality: Right Level: C3, C4, C5, C6, & C7 Medial Branch Level(s). Area Prepped: Posterior Cervico-thoracic Region DuraPrep (Iodine Povacrylex [0.7% available iodine] and Isopropyl Alcohol, 74% w/w) Safety Precautions: Aspiration looking for blood return was conducted prior to all injections. At no point did we inject any substances, as a needle was being advanced. Before injecting,  the patient was told to immediately notify me if he was experiencing any new onset of "ringing in the ears, or metallic taste in the mouth". No attempts were made at seeking any paresthesias. Safe injection practices and needle disposal techniques used. Medications properly checked for expiration dates. SDV (single dose vial) medications used. After the completion of the procedure, all disposable equipment used was discarded in the proper designated medical waste containers. Local Anesthesia:  Protocol guidelines were followed. The patient was positioned over the fluoroscopy table. The area was prepped in the usual manner. The time-out was completed. The target area was identified using fluoroscopy. A 12-in long, straight, sterile hemostat was used with fluoroscopic guidance to locate the targets for each level blocked. Once located, the skin was marked with an approved surgical skin marker. Once all sites were marked, the skin (epidermis, dermis, and hypodermis), as well as deeper tissues (fat, connective tissue and muscle) were infiltrated with a small amount of a short-acting local anesthetic, loaded on a 10cc syringe with a 25G, 1.5-in  Needle. An appropriate amount of time was allowed for local anesthetics to take effect before proceeding to the next step. Local Anesthetic: Lidocaine 2.0% The unused portion of the local anesthetic was discarded in the proper designated containers. Technical explanation of process:  C3 Medial Branch Nerve Block (MBB): The target area for the C3 dorsal medial articular branch is the lateral concave waist of the articular pillar of C3. Under fluoroscopic guidance, a Quincke needle was inserted until contact was made with os over the postero-lateral aspect of the articular pillar of C3 (target area). After negative aspiration for blood, 0.5 mL of the nerve block solution was injected without difficulty or complication. The needle was removed intact. C4 Medial Branch Nerve Block  (MBB): The target area for the C4 dorsal medial articular branch is the lateral concave waist of the articular pillar of C4. Under fluoroscopic guidance, a Quincke needle was inserted until contact was made with os over the postero-lateral aspect of the articular pillar of C4 (target area). After negative aspiration for blood, 0.5 mL of the nerve block solution was injected without difficulty or complication. The needle was removed intact. C5 Medial Branch Nerve Block (MBB): The target area for the C5 dorsal medial articular branch is the lateral concave waist of the articular pillar of C5. Under fluoroscopic guidance, a Quincke needle was inserted until contact was made with os over the postero-lateral aspect of the articular pillar of C5 (target area). After negative aspiration for blood, 0.5 mL of the nerve block solution was injected without difficulty or complication. The needle was removed intact. C6 Medial Branch Nerve Block (MBB): The target area for the C6 dorsal medial articular branch is the lateral concave waist of the articular pillar of C6. Under fluoroscopic guidance, a Quincke needle was inserted until contact was made with os over the postero-lateral aspect of the articular pillar of C6 (target area). After negative aspiration for blood, 0.5 mL of the nerve block solution was injected without difficulty or complication. The needle was removed intact. C7 Medial Branch Nerve Block (MBB): The target for the C7 dorsal medial articular branch lies on the superior-medial tip of the C7 transverse process. Under fluoroscopic guidance, a Quincke needle was inserted until contact was made with os over the postero-lateral aspect of the articular pillar of C7 (target area). After negative aspiration for blood, 0.5 mL of the nerve block solution was injected without difficulty or complication. The needle was removed intact. Procedural Needles: 22-gauge, 3.5-inch, Quincke needles used for all levels. Nerve  block solution: 0.2% PF-Ropivacaine           The unused portion of the solution was discarded in the proper designated containers.  Once the entire procedure was completed, the treated area was cleaned, making sure to leave some of the prepping solution back to take advantage of its long term bactericidal properties.  Anatomy Reference Guide:  Vitals:   03/13/20 1327 03/13/20 1335 03/13/20 1345 03/13/20 1354  BP: 118/79 (!) 135/97 (!) 137/100 (!) 133/92  Pulse:      Resp: 18 16 11 20   Temp:  98.1 F (36.7 C)  97.9 F (36.6 C)  TempSrc:  Temporal  Temporal  SpO2: 96% 99% 100% 100%  Weight:      Height:        Start Time: 1321 hrs. End Time: 1327 hrs.  Imaging Guidance (Spinal):          Type of Imaging Technique: Fluoroscopy Guidance (Spinal) Indication(s): Assistance in needle guidance and placement for procedures requiring needle placement in or near specific anatomical locations not easily accessible without such assistance. Exposure Time: Please see nurses notes. Contrast: None used. Fluoroscopic Guidance: I was personally present during the use of fluoroscopy. "Tunnel Vision Technique" used to obtain the best possible view of the target area. Parallax error corrected before commencing the procedure. "Direction-depth-direction" technique used to introduce the needle under continuous pulsed fluoroscopy. Once target was reached, antero-posterior, oblique, and lateral fluoroscopic projection used confirm needle placement in all planes. Images permanently stored in EMR. Interpretation: No contrast injected. I personally interpreted the imaging intraoperatively. Adequate needle placement confirmed in multiple planes. Permanent images saved into the patient's record.  Antibiotic Prophylaxis:   Anti-infectives (From admission, onward)   None     Indication(s): None identified  Post-operative Assessment:  Post-procedure Vital Signs:  Pulse/HCG Rate: (!) 10489 Temp: 97.9 F  (36.6 C) Resp: 20 BP: (!) 133/92 SpO2: 100 %  EBL: None  Complications: No immediate post-treatment complications observed by team, or reported by patient.  Note: The patient tolerated the entire procedure well. A repeat set of vitals were taken after the procedure and the patient was kept under observation following institutional policy, for this type of procedure. Post-procedural neurological assessment was performed, showing return to baseline, prior to discharge. The patient was provided with post-procedure discharge instructions, including a section on how to identify potential problems. Should any problems arise concerning this procedure, the patient was given instructions to immediately contact us, at any time, without hesitation. In any case, we plan to contact the patient by telephone for a follow-up status report regarding this interventional procedure.  Comments:  No additional relevant information.  Plan of Care  Orders:  Orders Placed This Encounter  Procedures  . CERVICAL FACET (MEDIAL BRANCH NERVE BLOCK)     Scheduling Instructions:     Procedure: Diagnostic right cervical facet block (without steroids)     Side: Right-sided     Level: C3-4, C4-5, C5-6 Facet joints (C3, C4, C5, C6, & C7 Medial Branch Nerves)     Sedation: With Sedation.     Timeframe: Today    Order Specific Question:   Where will this procedure be performed?    Answer:   [facility]  . DG PAIN CL[facility] C-ARM 1-60 MIN NO REPORT    Intraoperative interpretation by procedural physician at [place].    Standing Status:   Standing    Number of Occurrences:   1    Order Specific Question:   Reason for exam:    Answer:   Assistance in needle guidance and placement for procedures requiring needle placement in or near specific anatomical locations not easily accessible without such assistance.  . Informed Consent Details: Physician/Practitioner Attestation; Transcribe to consent form and  obtain patient signature    Provider Attestation: I, Capac Dossie Arbour, MD, (Pain Management  Specialist), the physician/practitioner, attest that I have discussed with the patient the benefits, risks, side effects, alternatives, likelihood of achieving goals and potential problems during recovery for the procedure that I have provided informed consent.    Scheduling Instructions:     Procedure: Right Cervical facet block under fluoroscopic guidance.     Indications: Chronic neck pain secondary to cervical facet syndrome     Note: Always confirm laterality of pain with Mr. Lindell, before procedure.     Transcribe to consent form and obtain patient signature.  . Provide equipment / supplies at bedside    Equipment required: Single use, disposable, "Block Tray"    Standing Status:   Standing    Number of Occurrences:   1    Order Specific Question:   Specify    Answer:   Block Tray   Chronic Opioid Analgesic:  Hydrocodone/APAP 5/325, 1 tab PO QD (5 mg/day of hydrocodone) MME/day: 5 mg/day.   Medications ordered for procedure: Meds ordered this encounter  Medications  . lidocaine (XYLOCAINE) 2 % (with pres) injection 400 mg  . lactated ringers infusion 1,000 mL  . midazolam (VERSED) 5 MG/5ML injection 1-2 mg    Make sure Flumazenil is available in the pyxis when using this medication. If oversedation occurs, administer 0.2 mg IV over 15 sec. If after 45 sec no response, administer 0.2 mg again over 1 min; may repeat at 1 min intervals; not to exceed 4 doses (1 mg)  . fentaNYL (SUBLIMAZE) injection 25-50 mcg    Make sure Narcan is available in the pyxis when using this medication. In the event of respiratory depression (RR< 8/min): Titrate NARCAN (naloxone) in increments of 0.1 to 0.2 mg IV at 2-3 minute intervals, until desired degree of reversal.  . ropivacaine (PF) 2 mg/mL (0.2%) (NAROPIN) injection 9 mL   Medications administered: We administered lidocaine, lactated ringers,  midazolam, fentaNYL, and ropivacaine (PF) 2 mg/mL (0.2%).  See the medical record for exact dosing, route, and time of administration.  Follow-up plan:   Return in about 2 weeks (around 03/27/2020) for (VV), (PP).       Interventional treatment options:  Under consideration: Possible left lumbar facet RFA  Diagnostic left cervical facetblock  Possible left cervical facet RFA  Diagnostic bilateral sacroiliac joint block Possible bilateral sacroiliac joint RFA Diagnostic trigger point injections   Therapeutic/palliative (PRN): Palliative right lumbar facet RFA #2 (last done 11/22/2019) Palliative right lumbar facetblock #3  Diagnostic/therapeutic left lumbar facetblock #2  Diagnostic/therapeutic left L4-5LESI#3 Palliative left CESI#4    Recent Visits Date Type Provider Dept  01/24/20 Telemedicine Milinda Pointer, MD [facility]  01/10/20 Procedure visit Milinda Pointer, MD [facility]  12/26/19 Telemedicine Milinda Pointer, MD [facility]  Showing recent visits within past 90 days and meeting all other requirements   Today's Visits Date Type Provider Dept  03/13/20 Procedure visit Milinda Pointer, MD [facility]  Showing today's visits and meeting all other requirements   Future Appointments Date Type Provider Dept  03/26/20 Appointment Milinda Pointer, MD [facility]  Showing future appointments within next 90 days and meeting all other requirements   Disposition: Discharge home  Discharge (Date  Time): 03/13/2020; 1359 hrs.   Primary Care Physician: Volney American, PA-C Location: [facility] Note by: Gaspar Cola, MD Date: 03/13/2020; Time: 2:33 PM  Disclaimer:  Medicine is not an Chief Strategy Officer. The only guarantee in medicine is that nothing is guaranteed. It  is important to note that the decision to proceed with this intervention was based on the  information collected from the patient. The Data and conclusions were drawn from the patient's questionnaire, the interview, and the physical examination. Because the information was provided in large part by the patient, it cannot be guaranteed that it has not been purposely or unconsciously manipulated. Every effort has been made to obtain as much relevant data as possible for this evaluation. It is important to note that the conclusions that lead to this procedure are derived in large part from the available data. Always take into account that the treatment will also be dependent on availability of resources and existing treatment guidelines, considered by other Pain Management Practitioners as being common knowledge and practice, at the time of the intervention. For Medico-Legal purposes, it is also important to point out that variation in procedural techniques and pharmacological choices are the acceptable norm. The indications, contraindications, technique, and results of the above procedure should only be interpreted and judged by a Board-Certified Interventional Pain Specialist with extensive familiarity and expertise in the same exact procedure and technique.

## 2020-03-14 ENCOUNTER — Telehealth: Payer: Self-pay

## 2020-03-14 NOTE — Telephone Encounter (Signed)
Post procedure phone call.  Patient states she is doing well.  

## 2020-03-15 ENCOUNTER — Other Ambulatory Visit: Payer: Self-pay | Admitting: Family Medicine

## 2020-03-15 NOTE — Telephone Encounter (Signed)
Requested Prescriptions  Pending Prescriptions Disp Refills  . albuterol (VENTOLIN HFA) 108 (90 Base) MCG/ACT inhaler [Pharmacy Med Name: ALBUTEROL HFA (VENTOLIN) INH] 18 g 2    Sig: TAKE 2 PUFFS BY MOUTH EVERY 6 HOURS AS NEEDED FOR WHEEZE OR SHORTNESS OF BREATH     Pulmonology:  Beta Agonists Failed - 03/15/2020  9:56 AM      Failed - One inhaler should last at least one month. If the patient is requesting refills earlier, contact the patient to check for uncontrolled symptoms.      Passed - Valid encounter within last 12 months    Recent Outpatient Visits          2 months ago Essential hypertension, benign   [facility] [name] [place], [place]   3 months ago COVID-19 virus infection   [facility], [place], [place]   9 months ago Essential hypertension, benign   [facility] [name] [place], [place]   1 year ago Essential hypertension, benign   [place], [name], [place]   1 year ago Healthcare maintenance   [place], NP      Future Appointments            In 1 week Milinda Pointer, MD [place]   In 3 months Orene Desanctis, [name], [place], [facility]

## 2020-03-24 NOTE — Progress Notes (Signed)
Patient: Jose Washington  Service Category: E/M  Provider: Gaspar Cola, MD  DOB: 09-13-1966  DOS: 03/26/2020  Location: Office  MRN: [id number]  Setting: Ambulatory outpatient  Referring Provider: Volney American,*  Type: Established Patient  Specialty: Interventional Pain Management  PCP: Volney American, PA-C  Location: Remote location  Delivery: TeleHealth     Virtual Encounter - Pain Management PROVIDER NOTE: Information contained herein reflects review and annotations entered in association with encounter. Interpretation of such information and data should be left to medically-trained personnel. Information provided to patient can be located elsewhere in the medical record under "Patient Instructions". Document created using STT-dictation technology, any transcriptional errors that may result from process are unintentional.    Contact & Pharmacy Preferred: [phone] Home: [phone] (home) Mobile: [phone] (mobile) E-mail: [email or web address]  [id number] [place]: [phone]: [phone]  Pre-screening  Mr. SDavisoffered "in-person" vs "virtual" encounter. He indicated preferring virtual for this encounter.   Reason COVID-19*  Social distancing based on CDC and AMA recommendations.   I contacted Jose Washington 03/26/2020 via telephone.      I clearly identified myself as FGaspar Cola MD. I verified that I was speaking with the correct person using two identifiers (Name: Jose Washington and date of birth: [birth year].  Consent I sought verbal advanced consent from Jose Roesfor virtual visit interactions. I informed Mr. SAbbeyof possible security and privacy concerns, risks, and limitations associated with providing "not-in-person" medical evaluation and management services. I also informed Mr. SAttiaof the availability of "in-person"  appointments. Finally, I informed him that there would be a charge for the virtual visit and that he could be  personally, fully or partially, financially responsible for it. Jose Washington understanding and agreed to proceed.   Historic Elements   Mr. CJESAIAH FABIANOis a 54y.o. year old, male patient evaluated today after his last contact with our practice on 03/14/2020. Jose Washington has a past medical history of Allergy, Anxiety, Chronic duodenal ulcer with hemorrhage (2012), Chronic neck pain, Depression, Hyperlipidemia, Hypertension, and Microscopic hematuria. He also  has a past surgical history that includes eye muscle repair (1972 and 1975) and bLEEDING ULCER (2012). Jose Washington a current medication list which includes the following prescription(s): acetaminophen, albuterol, atenolol, atorvastatin, [START ON 03/31/2020] baclofen, buspirone, calcium carbonate, vitamin d3, desloratadine-pseudoephedrine, diphenhydramine hcl, duloxetine, fenofibrate, fluticasone, glucosamine-chondroit-vit c-mn, [START ON 03/31/2020] hydrocodone-acetaminophen, [START ON 04/30/2020] hydrocodone-acetaminophen, [START ON 05/30/2020] hydrocodone-acetaminophen, hydroxyzine, ibuprofen, magnesium oxide, melatonin, methylsulfonylmethane, multivitamin, omeprazole, ondansetron, potassium, simethicone, sucralfate, and vitamin b-12. He  reports that he has never smoked. He has never used smokeless tobacco. He reports that he does not drink alcohol or use drugs. Jose Washington.   HPI  Today, he is being contacted for both, medication management and a post-procedure assessment.  The patient indicates doing well with the current medication regimen. No adverse reactions or side effects reported to the medications.  Today we had a conversation regarding what the next step will be.  By now we have already done a diagnostic right-sided as well as a left-sided cervical facet block without any steroids.  The results  that he has attained are similar and in both cases they confirm that the patient's cervicalgia is directly related to the cervical facet arthropathy.  The next step is to repeat a diagnostic injection  to confirm the findings on the first diagnostic injection.  The patient has requested to have the left side done around August since he has a work situation where he will not be able to have it sooner than that.  If the results of the second diagnostic injection follow the same pattern as the first, then it is my opinion that he would be an excellent candidate for radiofrequency ablation.  In addition to the above, the patient had some questions regarding possible restrictions on doing nerve blocks 2 weeks before or 2 weeks after the COVID-19 injections.  I took the time to clarify this for him letting him know that rather than a restriction, it was a recommendation on our part to avoid receiving "steroids" in any kind of form (injected, oral, topical, etc.) due to the decrease in immune system caused by steroids and how it can affect his ability to create adequate immunity from a vaccine.  The patient understood.  I also informed him that we needed to update his urine drug screening test as well as providing him with some information regarding how we will be going back to normal scheduling within the next couple months.  He understood and accepted.  Post-Procedure Evaluation  Procedure: Diagnostic right-sided cervical facet block #1 (without steroids) under fluoroscopic guidance and IV sedation Pre-procedure pain level: 2/10 Post-procedure: 0/10 (100% relief)  Sedation: Sedation provided.  Effectiveness during initial hour after procedure(Ultra-Short Term Relief): 100 % .  Local anesthetic used: Long-acting (4-6 hours) Effectiveness: Defined as any analgesic benefit obtained secondary to the administration of local anesthetics. This carries significant diagnostic value as to the etiological location, or  anatomical origin, of the pain. Duration of benefit is expected to coincide with the duration of the local anesthetic used.  Effectiveness during initial 4-6 hours after procedure(Short-Term Relief): 100 % .  Long-term benefit: Defined as any relief past the pharmacologic duration of the local anesthetics.  Effectiveness past the initial 6 hours after procedure(Long-Term Relief): 100 % .  Current benefits: Defined as benefit that persist at this time.   Analgesia:  90-100% better Function: Jose Washington reports improvement in function ROM: Jose Washington reports improvement in ROM  Pharmacotherapy Assessment  Analgesic: Hydrocodone/APAP 5/325, 1 tab PO QD (5 mg/day of hydrocodone) MME/day: 5 mg/day.   Monitoring:  PMP: PDMP reviewed during this encounter.       Pharmacotherapy: No side-effects or adverse reactions reported. Compliance: No problems identified. Effectiveness: Clinically acceptable. Plan: Refer to "POC".  UDS:  Summary  Date Value Ref Range Status  07/13/2018 FINAL  Final    Comment:    ==================================================================== TOXASSURE SELECT 13 (MW) ==================================================================== Test                             Result       Flag       Units Drug Present and Declared for Prescription Verification   Hydrocodone                    29           EXPECTED   ng/mg creat   Norhydrocodone                 65           EXPECTED   ng/mg creat    Sources of hydrocodone include scheduled prescription    medications. Norhydrocodone is an expected metabolite of  hydrocodone. ==================================================================== Test                      Result    Flag   Units      Ref Range   Creatinine              309              mg/dL      >=20 ==================================================================== Declared Medications:  The flagging and interpretation on this report are based  on the  following declared medications.  Unexpected results may arise from  inaccuracies in the declared medications.  **Note: The testing scope of this panel includes these medications:  Hydrocodone (Hydrocodone-Acetaminophen)  **Note: The testing scope of this panel does not include following  reported medications:  Acetaminophen (Hydrocodone-Acetaminophen)  Acetaminophen (Tylenol)  Atenolol (Tenormin)  Atorvastatin (Lipitor)  Baclofen (Lioresal)  Buspirone (BuSpar)  Chondroitin  Chondroitin (Glucosamine-Chondroitin)  Cyanocobalamin  Desloratadine (Clarinex-D)  Duloxetine (Cymbalta)  Fenofibrate  Fluticasone (Flonase)  Glucosamine (Glucosamine-Chondroitin)  Ibuprofen  Magnesium Oxide  Melatonin  Methylsulfonylmethane  Multivitamin (MVI)  Omeprazole (Prilosec)  Ondansetron (Zofran)  Potassium  Pseudoephedrine (Clarinex-D)  Vitamin D3 ==================================================================== For clinical consultation, please call [phone]. ====================================================================    Laboratory Chemistry Profile   Renal Lab Results  Component Value Date   BUN 20 12/21/2019   CREATININE 1.22 12/21/2019   BCR 16 12/21/2019   GFRAA 78 12/21/2019   GFRNONAA 67 12/21/2019     Hepatic Lab Results  Component Value Date   AST 27 12/21/2019   ALT 42 12/21/2019   ALBUMIN 4.6 12/21/2019   ALKPHOS 61 12/21/2019   LIPASE 29 08/14/2019     Electrolytes Lab Results  Component Value Date   NA 140 12/21/2019   K 4.5 12/21/2019   CL 100 12/21/2019   CALCIUM 9.9 12/21/2019   MG 2.0 11/12/2016     Bone Lab Results  Component Value Date   VD25OH 40.5 12/21/2019   25OHVITD1 23 (L) 11/12/2016   25OHVITD2 <1.0 11/12/2016   25OHVITD3 23 11/12/2016     Inflammation (CRP: Acute Phase) (ESR: Chronic Phase) Lab Results  Component Value Date   CRP 1.4 (H) 11/12/2016   ESRSEDRATE 3 11/12/2016       Note: Above Lab results  reviewed.  Imaging  DG PAIN CLINIC C-ARM 1-60 MIN NO REPORT Fluoro was used, but no Radiologist interpretation will be provided.  Please refer to "NOTES" tab for provider progress note.  Assessment  The primary encounter diagnosis was Cervical facet syndrome (Bilateral) (L>R). Diagnoses of Chronic pain syndrome, Pharmacologic therapy, Chronic right shoulder pain, Muscle spasticity, and Chronic musculoskeletal pain were also pertinent to this visit.  Plan of Care  Problem-specific:  No problem-specific Assessment & Plan notes found for this encounter.  Jose Washington has a current medication list which includes the following long-term medication(s): albuterol, atenolol, atorvastatin, [START ON 03/31/2020] baclofen, calcium carbonate, diphenhydramine hcl, duloxetine, fenofibrate, [START ON 03/31/2020] hydrocodone-acetaminophen, [START ON 04/30/2020] hydrocodone-acetaminophen, [START ON 05/30/2020] hydrocodone-acetaminophen, omeprazole, potassium, simethicone, and sucralfate.  Pharmacotherapy (Medications Ordered): Meds ordered this encounter  Medications  . HYDROcodone-acetaminophen (NORCO/VICODIN) 5-325 MG tablet    Sig: Take 1 tablet by mouth daily as needed for severe pain. Must last 30 days.    Dispense:  30 tablet    Refill:  0    Chronic Pain: STOP Act (Not applicable) Fill 1 day early if closed on refill date. Do not fill until: 03/31/2020. To last until:  04/30/2020. Avoid benzodiazepines within 8 hours of opioids  . HYDROcodone-acetaminophen (NORCO/VICODIN) 5-325 MG tablet    Sig: Take 1 tablet by mouth daily as needed for severe pain. Must last 30 days.    Dispense:  30 tablet    Refill:  0    Chronic Pain: STOP Act (Not applicable) Fill 1 day early if closed on refill date. Do not fill until: 04/30/2020. To last until: 05/30/2020. Avoid benzodiazepines within 8 hours of opioids  . HYDROcodone-acetaminophen (NORCO/VICODIN) 5-325 MG tablet    Sig: Take 1 tablet by mouth daily as  needed for severe pain. Must last 30 days.    Dispense:  30 tablet    Refill:  0    Chronic Pain: STOP Act (Not applicable) Fill 1 day early if closed on refill date. Do not fill until: 05/30/2020. To last until: 06/29/2020. Avoid benzodiazepines within 8 hours of opioids  . baclofen (LIORESAL) 10 MG tablet    Sig: Take 1 tablet (10 mg total) by mouth at bedtime.    Dispense:  90 tablet    Refill:  1    Fill one day early if pharmacy is closed on scheduled refill date. May substitute for generic if available.   Orders:  Orders Placed This Encounter  Procedures  . Radiofrequency,Cervical    Standing Status:   Future    Standing Expiration Date:   09/26/2020    Scheduling Instructions:     Procedure: Diagnostic left-sided cervical facet block #2 (without steroids) under fluoroscopic guidance and IV sedation     Side(s): Left-sided     Level(s): C3, C4, C5, C6, & C7 Medial Branch Nerve(s)     Sedation: With Sedation     Scheduling Timeframe: The patient has requested to have it done around August, 2021    Order Specific Question:   Where will this procedure be performed?    Answer:   [facility]  . ToxASSURE Select 13 (MW), Urine    Volume: 30 ml(s). Minimum 3 ml of urine is needed. Document temperature of fresh sample. Indications: Long term (current) use of opiate analgesic (Z32.992)    Order Specific Question:   Release to patient    Answer:   Immediate   Follow-up plan:   Return in about 13 weeks (around 06/25/2020) for (F2F), (MM), in addition, Procedure (w/ sedation): (L) C-FCT BLK #2 (August).      Interventional treatment options:  Under consideration: Possible left lumbar facet RFA  Diagnostic left cervical facetblock  Possible left cervical facet RFA  Diagnostic bilateral sacroiliac joint block Possible bilateral sacroiliac joint RFA Diagnostic trigger point injections   Therapeutic/palliative (PRN): Palliative right lumbar facet RFA #2 (last done  11/22/2019) Palliative right lumbar facetblock #3  Diagnostic/therapeutic left lumbar facetblock #2  Diagnostic/therapeutic left [id number]#3 Palliative left CESI#4 Diagnostic right-sided cervical facet block #2 (last done 03/13/2020) (100/100/100) Diagnostic left-sided cervical facet block #2 (last done 01/10/2020) (100/100/100)    Recent Visits Date Type Provider Dept  03/13/20 Procedure visit Milinda Pointer, MD [facility]  01/24/20 Telemedicine Milinda Pointer, MD [facility]  01/10/20 Procedure visit Milinda Pointer, MD [facility]  Showing recent visits within past 90 days and meeting all other requirements   Today's Visits Date Type Provider Dept  03/26/20 Telemedicine Milinda Pointer, MD [facility]  Showing today's visits and meeting all other requirements   Future Appointments No visits were found meeting these conditions.  Showing future appointments within next 90 days and  meeting all other requirements   I discussed the assessment and treatment plan with the patient. The patient was provided an opportunity to ask questions and all were answered. The patient agreed with the plan and demonstrated an understanding of the instructions.  Patient advised to call back or seek an in-person evaluation if the symptoms or condition worsens.  Duration of encounter: 22 minutes.  Note by: Gaspar Cola, MD Date: 03/26/2020; Time: 11:39 AM

## 2020-03-26 ENCOUNTER — Other Ambulatory Visit: Payer: Self-pay

## 2020-03-26 ENCOUNTER — Ambulatory Visit: Payer: 59 | Attending: Pain Medicine | Admitting: Pain Medicine

## 2020-03-26 DIAGNOSIS — M62838 Other muscle spasm: Secondary | ICD-10-CM

## 2020-03-26 DIAGNOSIS — Z79899 Other long term (current) drug therapy: Secondary | ICD-10-CM | POA: Diagnosis not present

## 2020-03-26 DIAGNOSIS — G894 Chronic pain syndrome: Secondary | ICD-10-CM | POA: Diagnosis not present

## 2020-03-26 DIAGNOSIS — M47812 Spondylosis without myelopathy or radiculopathy, cervical region: Secondary | ICD-10-CM

## 2020-03-26 DIAGNOSIS — M7918 Myalgia, other site: Secondary | ICD-10-CM

## 2020-03-26 DIAGNOSIS — M25511 Pain in right shoulder: Secondary | ICD-10-CM | POA: Diagnosis not present

## 2020-03-26 DIAGNOSIS — G8929 Other chronic pain: Secondary | ICD-10-CM | POA: Diagnosis not present

## 2020-03-26 MED ORDER — HYDROCODONE-ACETAMINOPHEN 5-325 MG PO TABS: 1.0000 | ORAL_TABLET | Freq: Every day | ORAL | 0 refills | Status: DC | PRN

## 2020-03-26 MED ORDER — BACLOFEN 10 MG PO TABS: 10.0000 mg | ORAL_TABLET | Freq: Every day | ORAL | 1 refills | Status: DC

## 2020-03-26 NOTE — Patient Instructions (Signed)
____________________________________________________________________________________________  Preparing for Procedure with Sedation  Procedure appointments are limited to planned procedures: . No Prescription Refills. . No disability issues will be discussed. . No medication changes will be discussed.  Instructions: . Oral Intake: Do not eat or drink anything for at least 8 hours prior to your procedure. (Exception: Blood Pressure Medication. See below.) . Transportation: Unless otherwise stated by your physician, you may drive yourself after the procedure. . Blood Pressure Medicine: Do not forget to take your blood pressure medicine with a sip of water the morning of the procedure. If your Diastolic (lower reading)is above 100 mmHg, elective cases will be cancelled/rescheduled. . Blood thinners: These will need to be stopped for procedures. Notify our staff if you are taking any blood thinners. Depending on which one you take, there will be specific instructions on how and when to stop it. . Diabetics on insulin: Notify the staff so that you can be scheduled 1st case in the morning. If your diabetes requires high dose insulin, take only  of your normal insulin dose the morning of the procedure and notify the staff that you have done so. . Preventing infections: Shower with an antibacterial soap the morning of your procedure. . Build-up your immune system: Take 1000 mg of Vitamin C with every meal (3 times a day) the day prior to your procedure. . Antibiotics: Inform the staff if you have a condition or reason that requires you to take antibiotics before dental procedures. . Pregnancy: If you are pregnant, call and cancel the procedure. . Sickness: If you have a cold, fever, or any active infections, call and cancel the procedure. . Arrival: You must be in the facility at least 30 minutes prior to your scheduled procedure. . Children: Do not bring children with you. . Dress appropriately:  Bring dark clothing that you would not mind if they get stained. . Valuables: Do not bring any jewelry or valuables.  Reasons to call and reschedule or cancel your procedure: (Following these recommendations will minimize the risk of a serious complication.) . Surgeries: Avoid having procedures within 2 weeks of any surgery. (Avoid for 2 weeks before or after any surgery). . Flu Shots: Avoid having procedures within 2 weeks of a flu shots or . (Avoid for 2 weeks before or after immunizations). . Barium: Avoid having a procedure within 7-10 days after having had a radiological study involving the use of radiological contrast. (Myelograms, Barium swallow or enema study). . Heart attacks: Avoid any elective procedures or surgeries for the initial 6 months after a "Myocardial Infarction" (Heart Attack). . Blood thinners: It is imperative that you stop these medications before procedures. Let us know if you if you take any blood thinner.  . Infection: Avoid procedures during or within two weeks of an infection (including chest colds or gastrointestinal problems). Symptoms associated with infections include: Localized redness, fever, chills, night sweats or profuse sweating, burning sensation when voiding, cough, congestion, stuffiness, runny nose, sore throat, diarrhea, nausea, vomiting, cold or Flu symptoms, recent or current infections. It is specially important if the infection is over the area that we intend to treat. . Heart and lung problems: Symptoms that may suggest an active cardiopulmonary problem include: cough, chest pain, breathing difficulties or shortness of breath, dizziness, ankle swelling, uncontrolled high or unusually low blood pressure, and/or palpitations. If you are experiencing any of these symptoms, cancel your procedure and contact your primary care physician for an evaluation.  Remember:  Regular Business hours are:    Monday to Thursday 8:00 AM to 4:00 PM  Provider's  Schedule: Demontez Novack, MD:  Procedure days: Tuesday and Thursday 7:30 AM to 4:00 PM  Bilal Lateef, MD:  Procedure days: Monday and Wednesday 7:30 AM to 4:00 PM ____________________________________________________________________________________________    

## 2020-03-27 ENCOUNTER — Telehealth: Payer: 59 | Admitting: Pain Medicine

## 2020-03-27 DIAGNOSIS — G894 Chronic pain syndrome: Secondary | ICD-10-CM | POA: Diagnosis not present

## 2020-03-27 DIAGNOSIS — Z79899 Other long term (current) drug therapy: Secondary | ICD-10-CM | POA: Diagnosis not present

## 2020-03-31 LAB — TOXASSURE SELECT 13 (MW), URINE

## 2020-04-11 ENCOUNTER — Encounter: Payer: Self-pay | Admitting: Family Medicine

## 2020-05-13 NOTE — Progress Notes (Signed)
PROVIDER NOTE: Information contained herein reflects review and annotations entered in association with encounter. Interpretation of such information and data should be left to medically-trained personnel. Information provided to patient can be located elsewhere in the medical record under "Patient Instructions". Document created using STT-dictation technology, any transcriptional errors that may result from process are unintentional.    Patient: Jose Washington  Service Category: E/M  Provider: Gaspar Cola, MD  DOB: 1966/01/24  DOS: 05/14/2020  Specialty: Interventional Pain Management  MRN: [id number]  Setting: Ambulatory outpatient  PCP: Volney American, PA-C  Type: Established Patient    Referring Provider: Volney American,*  Location: Office  Delivery: Face-to-face     HPI  Reason for encounter: Mr. [name], a 54 y.o. year old male, is here today for evaluation and management of his Chronic pain syndrome [G89.4]. [name] primary complain today is Neck Pain Last encounter: Practice (03/14/2020). My last encounter with him was on 03/13/2020. Pertinent problems: Mr. Pecore has Chronic neck pain (Third area of Pain) (Bilateral) (L>R); Chronic pain syndrome; Chronic low back pain (Primary Area of Pain) (Bilateral) (L>R) w/o sciatica ; Chronic upper back pain (Fourth area of Pain) (Bilateral) (L>R); Chronic shoulder pain (Bilateral) (L>R); Osteoarthritis of AC (acromioclavicular) joint (Right); Chronic sacroiliac joint pain (Bilateral) (L>R); Muscle spasticity; Cervical DDD (C4-5, C5-6, C6-7 and C7-T1); Cervical foraminal stenosis (Bilateral: C5-6 & C6-7, Left: C4-5 & C7-T1); Cervical radiculitis (Bilateral) (L>R); Cervical facet syndrome (Bilateral) (L>R); Cervical spondylosis; Musculoskeletal neck pain (trapezius) (Left); Chronic knee pain (Left); Chronic lower extremity pain (Secondary area of Pain) (Left); Grade 1 Retrolisthesis of L3 over L4; Lumbar facet  arthropathy (Bilateral); Lumbar facet osteoarthritis; Lumbar facet syndrome (Bilateral) (R>L); Lumbar spondylosis; Chronic right shoulder pain; Suprascapular neuropathy, right; DDD (degenerative disc disease), lumbosacral; Lumbar facet hypertrophy (Multilevel) (Bilateral); Spondylosis without myelopathy or radiculopathy, lumbosacral region; Inflammatory spondylopathy of lumbosacral region [facility]); Chronic lower extremity pain (Right); Chronic musculoskeletal pain; Abnormal MRI, lumbar spine (09/07/2017); Acute postoperative pain; Cervicalgia (Bilateral) (L>R); Spondylosis without myelopathy or radiculopathy, cervical region; Myofascial pain syndrome of thoracic spine (serratus muscle) (Right); and Chronic flank pain (Right) on their pertinent problem list. Pain Assessment: Severity of Chronic pain is reported as a 2 /10. Location: Neck Left, Right/ . Onset: More than a month ago. Quality: Aching, Constant, Nagging. Timing: Constant. Modifying factor(s): lay down and meds. Vitals:  height is '5\' 7"'  (1.702 m) and weight is 185 lb (83.9 kg). His temperature is 98.1 F (36.7 C). His blood pressure is 110/75 and his pulse is 72. His oxygen saturation is 99%.   The patient comes into the clinics today indicating that he has been experiencing a new type of pain that he had never reported to [place] before.  He refers that around 2014 he had an excessive hyperextension injury of the right arm where he felt something rip around the area of the right flank and ribs and pain around the tip of his right scapula this pain lasted for quite some time but then it improved however, depending on what he does it comes back.  This time appears to have been secondary to pulling on the starting cord of his lumbar.  Today he has described the mechanism of his injury in great detail which makes me think that this was a sprain/strain of the serratus anterior muscle.  Please see below for exact pattern of his pain.      Because this has  been intermittently there since 2014, it is not  likely that its "just going to go away".  Today we spent quite some time going over some techniques to avoid flaring up this pain.  I have explained to him that trigger points do not really "go away", they simply go dormant until something reactivates them.  I also informed him that whenever this happens, we can always do some trigger points to get it under control, until the next time.  We discussed several techniques including but not limited to "preemptive analgesia" and perhaps buying a lawn more with an electric starter.  In addition to this, today we touched on the subject of his neck pain and the fact that he wants to resume therapy.  On 03/13/2020 we did his first left diagnostic cervical facet block without steroids.  On 03/26/2020, on a follow-up evaluation of the first diagnostic cervical facet block, he reported that he attained 100% relief of the pain that had actually lasted close to 2 weeks.  I would have to admit that I was rather surprised since there were no steroid is included in the solution.  This only makes me think on how stupid these insurance company regulations are about restricting what we put in the injections.  I can only imagine that if he attained 2 weeks of 100% relief of the pain with just the local anesthetic, it is very likely that he could have attained significantly longer benefits with the steroids, which we were not allow to put in because of this stupid insurance company regulations.  In any case, he is now scheduled to have his second diagnostic injection on August 5, again without any steroids.  If this second injection provides him with similar benefits, then he wants to move to a radiofrequency ablation before the end of the year.  Since he is now also experiencing pain on the right side of the neck, he wants to go ahead and have his first diagnostic right-sided cervical facet blocks done on the third or fourth week of  August.  He has expressed interest in perhaps having a radiofrequency ablation of the right side done around January, should the blocks on the right side work as well as those on the left.  I think this is fairly reasonable.   The patient indicates doing well with the current medication regimen. No adverse reactions or side effects reported to the medications.  Today I have renewed his hydrocodone prescription and in view of this, we have canceled his previously scheduled 06/25/2020 medication management encounter to 08/27/2020, when he will need his medications refilled.  Pharmacotherapy Assessment   Analgesic: Hydrocodone/APAP 5/325, 1 tab PO QD (5 mg/day of hydrocodone) MME/day: 5 mg/day.   Monitoring:  PMP: PDMP reviewed during this encounter.       Pharmacotherapy: No side-effects or adverse reactions reported. Compliance: Review of the patient's 03/27/2020 UDS shows evidence of carboxy THC.  This is in violation to our medication agreement.  We will need to address this with the patient as soon as possible. Effectiveness: Clinically acceptable.  Chauncey Fischer, RN  05/14/2020  2:49 PM  Sign when Signing Visit +Nursing Pain Medication Assessment:  Safety precautions to be maintained throughout the outpatient stay will include: orient to surroundings, keep bed in low position, maintain call bell within reach at all times, provide assistance with transfer out of bed and ambulation.  Medication Inspection Compliance: Mr. Klemann did not comply with our request to bring his pills to be counted. He was reminded that bringing the medication  bottles, even when empty, is a requirement.  Medication: None brought in. Pill/Patch Count: None available to be counted. Bottle Appearance: No container available. Did not bring bottle(s) to appointment. Filled Date: N/A Last Medication intake:  YesterdaySafety precautions to be maintained throughout the outpatient stay will include: orient to  surroundings, keep bed in low position, maintain call bell within reach at all times, provide assistance with transfer out of bed and ambulation.     UDS:  Summary  Date Value Ref Range Status  03/27/2020 Note  Final    Comment:    ==================================================================== [employer] [facility] (MW) ==================================================================== Test                             Result       Flag       Units Drug Present and Declared for Prescription Verification   Hydrocodone                    105          EXPECTED   ng/mg creat   Dihydrocodeine                 76           EXPECTED   ng/mg creat   Norhydrocodone                 267          EXPECTED   ng/mg creat    Sources of hydrocodone include scheduled prescription medications.    Dihydrocodeine and norhydrocodone are expected metabolites of    hydrocodone. Dihydrocodeine is also available as a scheduled    prescription medication. Drug Present not Declared for Prescription Verification   Carboxy-THC                    15           UNEXPECTED ng/mg creat    Carboxy-THC is a metabolite of tetrahydrocannabinol (THC). Source of    THC is most commonly herbal marijuana or marijuana-based products,    but THC is also present in a scheduled prescription medication.    Trace amounts of THC can be present in hemp and cannabidiol (CBD)    products. This test is not intended to distinguish between delta-9-    tetrahydrocannabinol, the predominant form of THC in most herbal or    marijuana-based products, and delta-8-tetrahydrocannabinol. ==================================================================== Test                      Result    Flag   Units      Ref Range   Creatinine              213              mg/dL      >=20 ==================================================================== Declared Medications:  The flagging and interpretation on this report are based on the  following  declared medications.  Unexpected results may arise from  inaccuracies in the declared medications.  **Note: The testing scope of this panel includes these medications:  Hydrocodone (Norco)  **Note: The testing scope of this panel does not include the  following reported medications:  Acetaminophen (Tylenol)  Acetaminophen (Norco)  Albuterol (Ventolin HFA)  Atenolol (Tenormin)  Atorvastatin (Lipitor)  Baclofen (Lioresal)  Buspirone (Buspar)  Chondroitin  Cyanocobalamin  Desloratadine (Clarinex)  Fenofibrate  Fluticasone (Flonase)  Glucosamine  Hydroxyzine (Atarax)  Ibuprofen (Advil)  Iron  Magnesium  Melatonin  Methylsulfonylmethane  Multivitamin  Omeprazole (Prilosec)  Ondansetron (Zofran)  Potassium (Klor-Con)  Simethicone  Sucralfate (Carafate)  Vitamin C  Vitamin D3 ==================================================================== For clinical consultation, please call [phone]. ====================================================================      ROS  Constitutional: Denies any fever or chills Gastrointestinal: No reported hemesis, hematochezia, vomiting, or acute GI distress Musculoskeletal: Denies any acute onset joint swelling, redness, loss of ROM, or weakness Neurological: No reported episodes of acute onset apraxia, aphasia, dysarthria, agnosia, amnesia, paralysis, loss of coordination, or loss of consciousness  Medication Review  Calcium Carbonate, DULoxetine, Elderberry, Glucosamine-Chondroit-Vit C-Mn, HYDROcodone-acetaminophen, Methylsulfonylmethane, Potassium, Vitamin D3, acetaminophen, albuterol, atenolol, atorvastatin, baclofen, busPIRone, cannabidiol, desloratadine-pseudoephedrine, diphenhydrAMINE HCl, fenofibrate, fluticasone, hydrOXYzine, ibuprofen, magnesium oxide, melatonin, multivitamin, omeprazole, ondansetron, simethicone, sucralfate, vitamin B-12, and vitamin E  History Review  Allergy: Mr. Kowalke has No Known Allergies. Drug:  Mr. Hunnell  reports no history of drug use. Alcohol:  reports no history of alcohol use. Tobacco:  reports that he has never smoked. He has never used smokeless tobacco. Social: [name]  reports that he has never smoked. He has never used smokeless tobacco. He reports that he does not drink alcohol and does not use drugs. Medical:  has a past medical history of Allergy, Anxiety, Chronic duodenal ulcer with hemorrhage (2012), Chronic neck pain, Depression, Hyperlipidemia, Hypertension, and Microscopic hematuria. Surgical: Mr. Vanhise  has a past surgical history that includes eye muscle repair [phone] and 1975) and bLEEDING ULCER (2012). Family: family history includes Allergies in his brother; Cancer in his maternal grandfather and mother; Dementia in his father and paternal grandfather; Depression in his father; Diabetes in his mother; Mental illness in his father; Stroke in his paternal grandfather.  Laboratory Chemistry Profile   Renal Lab Results  Component Value Date   BUN 20 12/21/2019   CREATININE 1.22 12/21/2019   BCR 16 12/21/2019   GFRAA 78 12/21/2019   GFRNONAA 67 12/21/2019     Hepatic Lab Results  Component Value Date   AST 27 12/21/2019   ALT 42 12/21/2019   ALBUMIN 4.6 12/21/2019   ALKPHOS 61 12/21/2019   LIPASE 29 08/14/2019     Electrolytes Lab Results  Component Value Date   NA 140 12/21/2019   K 4.5 12/21/2019   CL 100 12/21/2019   CALCIUM 9.9 12/21/2019   MG 2.0 11/12/2016     Bone Lab Results  Component Value Date   VD25OH 40.5 12/21/2019   25OHVITD1 23 (L) 11/12/2016   25OHVITD2 <1.0 11/12/2016   25OHVITD3 23 11/12/2016     Inflammation (CRP: Acute Phase) (ESR: Chronic Phase) Lab Results  Component Value Date   CRP 1.4 (H) 11/12/2016   ESRSEDRATE 3 11/12/2016       Note: Above Lab results reviewed.  Recent Imaging Review  DG PAIN CLINIC C-ARM 1-60 MIN NO REPORT Fluoro was used, but no Radiologist interpretation will be provided.    Please refer to "NOTES" tab for provider progress note. Note: Reviewed        Physical Exam  General appearance: Well nourished, well developed, and well hydrated. In no apparent acute distress Mental status: Alert, oriented x 3 (person, place, & time)       Respiratory: No evidence of acute respiratory distress Eyes: PERLA Vitals: BP 110/75   Pulse 72   Temp 98.1 F (36.7 C)   Ht '5\' 7"'  (1.702 m)   Wt 185 lb (83.9 kg)   SpO2 99%  BMI 28.98 kg/m  BMI: Estimated body mass index is 28.98 kg/m as calculated from the following:   Height as of this encounter: '5\' 7"'  (1.702 m).   Weight as of this encounter: 185 lb (83.9 kg). Ideal: Ideal body weight: 66.1 kg (145 lb 11.6 oz) Adjusted ideal body weight: 73.2 kg (161 lb 6.9 oz)  Assessment   Status Diagnosis  Controlled Controlled Controlled 1. Chronic pain syndrome   2. Chronic low back pain (Primary Area of Pain) (Bilateral) (L>R) w/o sciatica    3. Chronic lower extremity pain (Secondary area of Pain) (Left)   4. Myofascial pain syndrome of thoracic spine (serratus muscle) (Right)   5. Chronic flank pain (Right)      Updated Problems: Problem  Myofascial pain syndrome of thoracic spine (serratus muscle) (Right)   Right flank pain due to a chronic serratus muscle sprain/strain injury which initially occurred around 2014 after a posterior excessive hyperextension of his right arm which subsequently triggered pain in the area of the right ribs and around his scapula.   Chronic flank pain (Right)   Possibly due to a serratus anterior muscle injury     Plan of Care  Problem-specific:  No problem-specific Assessment & Plan notes found for this encounter.  Mr. RONDA KAZMI has a current medication list which includes the following long-term medication(s): albuterol, atenolol, atorvastatin, baclofen, calcium carbonate, diphenhydramine hcl, duloxetine, fenofibrate, [START ON 05/30/2020] hydrocodone-acetaminophen, [START ON  06/29/2020] hydrocodone-acetaminophen, [START ON 07/29/2020] hydrocodone-acetaminophen, omeprazole, potassium, simethicone, and sucralfate.  Pharmacotherapy (Medications Ordered): Meds ordered this encounter  Medications  . HYDROcodone-acetaminophen (NORCO/VICODIN) 5-325 MG tablet    Sig: Take 1 tablet by mouth daily as needed for severe pain. Must last 30 days.    Dispense:  30 tablet    Refill:  0    Chronic Pain: STOP Act (Not applicable) Fill 1 day early if closed on refill date. Do not fill until: 06/29/2020. To last until: 07/29/2020. Avoid benzodiazepines within 8 hours of opioids  . HYDROcodone-acetaminophen (NORCO/VICODIN) 5-325 MG tablet    Sig: Take 1 tablet by mouth daily as needed for severe pain. Must last 30 days.    Dispense:  30 tablet    Refill:  0    Chronic Pain: STOP Act (Not applicable) Fill 1 day early if closed on refill date. Do not fill until: 07/29/2020. To last until: 08/28/2020. Avoid benzodiazepines within 8 hours of opioids   Orders:  No orders of the defined types were placed in this encounter.  Follow-up plan:   Return in about 15 weeks (around 08/27/2020) for F2F(20-min), MM (never on procedure day).      Interventional treatment options:  Under consideration: Possible left lumbar facet RFA  Diagnostic left cervical facetblock  Possible left cervical facet RFA  Diagnostic bilateral sacroiliac joint block Possible bilateral sacroiliac joint RFA Diagnostic trigger point injections   Therapeutic/palliative (PRN): Palliative right lumbar facet RFA #2 (last done 11/22/2019) Palliative right lumbar facetblock #3  Diagnostic/therapeutic left lumbar facetblock #2  Diagnostic/therapeutic left [id number]#3 Palliative left CESI#4 Diagnostic right-sided cervical facet block #2 (last done 03/13/2020) (100/100/100) Diagnostic left-sided cervical facet block #2 (last done 01/10/2020) (100/100/100)     Recent Visits Date Type Provider Dept  03/26/20  Telemedicine Milinda Pointer, MD [facility]  03/13/20 Procedure visit Milinda Pointer, MD [facility]  Showing recent visits within past 90 days and meeting all other requirements Today's Visits Date Type Provider Dept  05/14/20 Office Visit Milinda Pointer, MD [facility]  Showing today's visits and meeting all other requirements Future Appointments Date Type Provider Dept  06/05/20 Appointment Milinda Pointer, MD [facility]  Showing future appointments within next 90 days and meeting all other requirements  I discussed the assessment and treatment plan with the patient. The patient was provided an opportunity to ask questions and all were answered. The patient agreed with the plan and demonstrated an understanding of the instructions.  Patient advised to call back or seek an in-person evaluation if the symptoms or condition worsens.  Duration of encounter: 42 minutes.  Note by: Gaspar Cola, MD Date: 05/14/2020; Time: 5:50 PM

## 2020-05-14 ENCOUNTER — Encounter: Payer: Self-pay | Admitting: Pain Medicine

## 2020-05-14 ENCOUNTER — Other Ambulatory Visit: Payer: Self-pay

## 2020-05-14 ENCOUNTER — Ambulatory Visit: Payer: 59 | Attending: Pain Medicine | Admitting: Pain Medicine

## 2020-05-14 VITALS — BP 110/75 | HR 72 | Temp 98.1°F | Ht 67.0 in | Wt 185.0 lb

## 2020-05-14 DIAGNOSIS — G8929 Other chronic pain: Secondary | ICD-10-CM | POA: Diagnosis not present

## 2020-05-14 DIAGNOSIS — M79605 Pain in left leg: Secondary | ICD-10-CM

## 2020-05-14 DIAGNOSIS — G894 Chronic pain syndrome: Secondary | ICD-10-CM

## 2020-05-14 DIAGNOSIS — M545 Low back pain: Secondary | ICD-10-CM

## 2020-05-14 DIAGNOSIS — R109 Unspecified abdominal pain: Secondary | ICD-10-CM

## 2020-05-14 DIAGNOSIS — M7918 Myalgia, other site: Secondary | ICD-10-CM | POA: Diagnosis not present

## 2020-05-14 MED ORDER — HYDROCODONE-ACETAMINOPHEN 5-325 MG PO TABS: 1.0000 | ORAL_TABLET | Freq: Every day | ORAL | 0 refills | Status: DC | PRN

## 2020-05-14 NOTE — Patient Instructions (Addendum)
____________________________________________________________________________________________  Drug Holidays (Slow)  What is a "Drug Holiday"? Drug Holiday: is the name given to the period of time during which a patient stops taking a medication(s) for the purpose of eliminating tolerance to the drug.  Benefits . Improved effectiveness of opioids. . Decreased opioid dose needed to achieve benefits. . Improved pain with lesser dose.  What is tolerance? Tolerance: is the progressive decreased in effectiveness of a drug due to its repetitive use. With repetitive use, the body gets use to the medication and as a consequence, it loses its effectiveness. This is a common problem seen with opioid pain medications. As a result, a larger dose of the drug is needed to achieve the same effect that used to be obtained with a smaller dose.  How long should a "Drug Holiday" last? You should stay off of the pain medicine for at least 14 consecutive days. (2 weeks)  Should I stop the medicine "cold turkey"? No. You should always coordinate with your Pain Specialist so that he/she can provide you with the correct medication dose to make the transition as smoothly as possible.  How do I stop the medicine? Slowly. You will be instructed to decrease the daily amount of pills that you take by one (1) pill every seven (7) days. This is called a "slow downward taper" of your dose. For example: if you normally take four (4) pills per day, you will be asked to drop this dose to three (3) pills per day for seven (7) days, then to two (2) pills per day for seven (7) days, then to one (1) per day for seven (7) days, and at the end of those last seven (7) days, this is when the "Drug Holiday" would start.   Will I have withdrawals? By doing a "slow downward taper" like this one, it is unlikely that you will experience any significant withdrawal symptoms. Typically, what triggers withdrawals is the sudden stop of a high  dose opioid therapy. Withdrawals can usually be avoided by slowly decreasing the dose over a prolonged period of time.  What are withdrawals? Withdrawals: refers to the wide range of symptoms that occur after stopping or dramatically reducing opiate drugs after heavy and prolonged use. Withdrawal symptoms do not occur to patients that use low dose opioids, or those who take the medication sporadically. Contrary to benzodiazepine (example: Valium, Xanax, etc.) or alcohol withdrawals ("Delirium Tremens"), opioid withdrawals are not lethal. Withdrawals are the physical manifestation of the body getting rid of the excess receptors.  Expected Symptoms Early symptoms of withdrawal may include: . Agitation . Anxiety . Muscle aches . Increased tearing . Insomnia . Runny nose . Sweating . Yawning  Late symptoms of withdrawal may include: . Abdominal cramping . Diarrhea . Dilated pupils . Goose bumps . Nausea . Vomiting  Will I experience withdrawals? Due to the slow nature of the taper, it is very unlikely that you will experience any.  What is a slow taper? Taper: refers to the gradual decrease in dose.  ___________________________________________________________________________________________    ____________________________________________________________________________________________  Medication Rules  Purpose: To inform patients, and their family members, of our rules and regulations.  Applies to: All patients receiving prescriptions (written or electronic).  Pharmacy of record: Pharmacy where electronic prescriptions will be sent. If written prescriptions are taken to a different pharmacy, please inform the nursing staff. The pharmacy listed in the electronic medical record should be the one where you would like electronic prescriptions to be sent.  Electronic   prescriptions: In compliance with the [place] Strengthen Opioid Misuse Prevention (STOP) Act of 2017 (Session  Law 2017-74/H243), effective November 01, 2018, all controlled substances must be electronically prescribed. Calling prescriptions to the pharmacy will cease to exist.  Prescription refills: Only during scheduled appointments. Applies to all prescriptions.  NOTE: The following applies primarily to controlled substances (Opioid* Pain Medications).   Type of encounter (visit): For patients receiving controlled substances, face-to-face visits are required. (Not an option or up to the patient.)  Patient's responsibilities: 1. Pain Pills: Bring all pain pills to every appointment (except for procedure appointments). 2. Pill Bottles: Bring pills in original pharmacy bottle. Always bring the newest bottle. Bring bottle, even if empty. 3. Medication refills: You are responsible for knowing and keeping track of what medications you take and those you need refilled. The day before your appointment: write a list of all prescriptions that need to be refilled. The day of the appointment: give the list to the admitting nurse. Prescriptions will be written only during appointments. No prescriptions will be written on procedure days. If you forget a medication: it will not be "Called in", "Faxed", or "electronically sent". You will need to get another appointment to get these prescribed. No early refills. Do not call asking to have your prescription filled early. 4. Prescription Accuracy: You are responsible for carefully inspecting your prescriptions before leaving our office. Have the discharge nurse carefully go over each prescription with you, before taking them home. Make sure that your name is accurately spelled, that your address is correct. Check the name and dose of your medication to make sure it is accurate. Check the number of pills, and the written instructions to make sure they are clear and accurate. Make sure that you are given enough medication to last until your next medication refill  appointment. 5. Taking Medication: Take medication as prescribed. When it comes to controlled substances, taking less pills or less frequently than prescribed is permitted and encouraged. Never take more pills than instructed. Never take medication more frequently than prescribed.  6. Inform other Doctors: Always inform, all of your healthcare providers, of all the medications you take. 7. Pain Medication from other Providers: You are not allowed to accept any additional pain medication from any other Doctor or Healthcare provider. There are two exceptions to this rule. (see below) In the event that you require additional pain medication, you are responsible for notifying us, as stated below. 8. Medication Agreement: You are responsible for carefully reading and following our Medication Agreement. This must be signed before receiving any prescriptions from our practice. Safely store a copy of your signed Agreement. Violations to the Agreement will result in no further prescriptions. (Additional copies of our Medication Agreement are available upon request.) 9. Laws, Rules, & Regulations: All patients are expected to follow all Federal and State Laws, Statutes, Rules, & Regulations. Ignorance of the Laws does not constitute a valid excuse.  10. Illegal drugs and Controlled Substances: The use of illegal substances (including, but not limited to marijuana and its derivatives) and/or the illegal use of any controlled substances is strictly prohibited. Violation of this rule may result in the immediate and permanent discontinuation of any and all prescriptions being written by our practice. The use of any illegal substances is prohibited. 11. Adopted CDC guidelines & recommendations: Target dosing levels will be at or below 60 MME/day. Use of benzodiazepines** is not recommended.  Exceptions: There are only two exceptions to the rule of not   receiving pain medications from other Healthcare  Providers. 1. Exception #1 (Emergencies): In the event of an emergency (i.e.: accident requiring emergency care), you are allowed to receive additional pain medication. However, you are responsible for: As soon as you are able, call our office [phone], at any time of the day or night, and leave a message stating your name, the date and nature of the emergency, and the name and dose of the medication prescribed. In the event that your call is answered by a member of our staff, make sure to document and save the date, time, and the name of the person that took your information.  2. Exception #2 (Planned Surgery): In the event that you are scheduled by another doctor or dentist to have any type of surgery or procedure, you are allowed (for a period no longer than 30 days), to receive additional pain medication, for the acute post-op pain. However, in this case, you are responsible for picking up a copy of our "Post-op Pain Management for Surgeons" handout, and giving it to your surgeon or dentist. This document is available at our office, and does not require an appointment to obtain it. Simply go to our office during business hours (Monday-Thursday from 8:00 AM to 4:00 PM) (Friday 8:00 AM to 12:00 Noon) or if you have a scheduled appointment with us, prior to your surgery, and ask for it by name. In addition, you will need to provide us with your name, name of your surgeon, type of surgery, and date of procedure or surgery.  *Opioid medications include: morphine, codeine, oxycodone, oxymorphone, hydrocodone, hydromorphone, meperidine, tramadol, tapentadol, buprenorphine, fentanyl, methadone. **Benzodiazepine medications include: diazepam (Valium), alprazolam (Xanax), clonazepam (Klonopine), lorazepam (Ativan), clorazepate (Tranxene), chlordiazepoxide (Librium), estazolam (Prosom), oxazepam (Serax), temazepam (Restoril), triazolam (Halcion) (Last updated:  12/29/2017) ____________________________________________________________________________________________   ____________________________________________________________________________________________  Medication Recommendations and Reminders  Applies to: All patients receiving prescriptions (written and/or electronic).  Medication Rules & Regulations: These rules and regulations exist for your safety and that of others. They are not flexible and neither are we. Dismissing or ignoring them will be considered "non-compliance" with medication therapy, resulting in complete and irreversible termination of such therapy. (See document titled "Medication Rules" for more details.) In all conscience, because of safety reasons, we cannot continue providing a therapy where the patient does not follow instructions.  Pharmacy of record:   Definition: This is the pharmacy where your electronic prescriptions will be sent.   We do not endorse any particular pharmacy.  You are not restricted in your choice of pharmacy.  The pharmacy listed in the electronic medical record should be the one where you want electronic prescriptions to be sent.  If you choose to change pharmacy, simply notify our nursing staff of your choice of new pharmacy.  Recommendations:  Keep all of your pain medications in a safe place, under lock and key, even if you live alone.   After you fill your prescription, take 1 week's worth of pills and put them away in a safe place. You should keep a separate, properly labeled bottle for this purpose. The remainder should be kept in the original bottle. Use this as your primary supply, until it runs out. Once it's gone, then you know that you have 1 week's worth of medicine, and it is time to come in for a prescription refill. If you do this correctly, it is unlikely that you will ever run out of medicine.  To make sure that the above recommendation works,   it is very important that you  make sure your medication refill appointments are scheduled at least 1 week before you run out of medicine. To do this in an effective manner, make sure that you do not leave the office without scheduling your next medication management appointment. Always ask the nursing staff to show you in your prescription , when your medication will be running out. Then arrange for the receptionist to get you a return appointment, at least 7 days before you run out of medicine. Do not wait until you have 1 or 2 pills left, to come in. This is very poor planning and does not take into consideration that we may need to cancel appointments due to bad weather, sickness, or emergencies affecting our staff.  "Partial Fill": If for any reason your pharmacy does not have enough pills/tablets to completely fill or refill your prescription, do not allow for a "partial fill". You will need a separate prescription to fill the remaining amount, which we will not provide. If the reason for the partial fill is your insurance, you will need to talk to the pharmacist about payment alternatives for the remaining tablets, but again, do not accept a partial fill.  Prescription refills and/or changes in medication(s):   Prescription refills, and/or changes in dose or medication, will be conducted only during scheduled medication management appointments. (Applies to both, written and electronic prescriptions.)  No refills on procedure days. No medication will be changed or started on procedure days. No changes, adjustments, and/or refills will be conducted on a procedure day. Doing so will interfere with the diagnostic portion of the procedure.  No phone refills. No medications will be "called into the pharmacy".  No Fax refills.  No weekend refills.  No Holliday refills.  No after hours refills.  Remember:  Business hours are:  Monday to Thursday 8:00 AM to 4:00 PM Provider's Schedule: Albi Rappaport, MD - Appointments  are:  Medication management: Monday and Wednesday 8:00 AM to 4:00 PM Procedure day: Tuesday and Thursday 7:30 AM to 4:00 PM Bilal Lateef, MD - Appointments are:  Medication management: Tuesday and Thursday 8:00 AM to 4:00 PM Procedure day: Monday and Wednesday 7:30 AM to 4:00 PM (Last update: 12/29/2017) ____________________________________________________________________________________________   ____________________________________________________________________________________________  CANNABIDIOL (AKA: CBD Oil or Pills)  Applies to: All patients receiving prescriptions of controlled substances (written and/or electronic).  General Information: Cannabidiol (CBD) was discovered in 1940. It is one of some 113 identified cannabinoids in cannabis (Marijuana) plants, accounting for up to 40% of the plant's extract. As of 2018, preliminary clinical research on cannabidiol included studies of anxiety, cognition, movement disorders, and pain.  Cannabidiol is consummed in multiple ways, including inhalation of cannabis smoke or vapor, as an aerosol spray into the cheek, and by mouth. It may be supplied as CBD oil containing CBD as the active ingredient (no added tetrahydrocannabinol (THC) or terpenes), a full-plant CBD-dominant hemp extract oil, capsules, dried cannabis, or as a liquid solution. CBD is thought not have the same psychoactivity as THC, and may affect the actions of THC. Studies suggest that CBD may interact with different biological targets, including cannabinoid receptors and other neurotransmitter receptors. As of 2018 the mechanism of action for its biological effects has not been determined.  In the [place], cannabidiol has a limited approval by the Food and Drug Administration (FDA) for treatment of only two types of epilepsy disorders. The side effects of long-term use of the drug include somnolence, decreased appetite, diarrhea,   fatigue, malaise, weakness, sleeping  problems, and others.  CBD remains a Schedule I drug prohibited for any use.  Legality: Some manufacturers ship CBD products nationally, an illegal action which the FDA has not enforced in 2018, with CBD remaining the subject of an FDA investigational new drug evaluation, and is not considered legal as a dietary supplement or food ingredient as of December 2018. Federal illegality has made it difficult historically to conduct research on CBD. CBD is openly sold in head shops and health food stores in some states where such sales have not been explicitly legalized.  Warning: Because it is not FDA approved for general use or treatment of pain, it is not required to undergo the same manufacturing controls as prescription drugs.  This means that the available cannabidiol (CBD) may be contaminated with THC.  If this is the case, it will trigger a positive urine drug screen (UDS) test for cannabinoids (Marijuana).  Because a positive UDS for illicit substances is a violation of our medication agreement, your opioid analgesics (pain medicine) may be permanently discontinued. (Last update: 01/19/2018) ____________________________________________________________________________________________   ____________________________________________________________________________________________  Pain Prevention Technique  Definition:   A technique used to minimize the effects of an activity known to cause inflammation or swelling, which in turn leads to an increase in pain.  Purpose: To prevent swelling from occurring. It is based on the fact that it is easier to prevent swelling from happening than it is to get rid of it, once it occurs.  Contraindications: 1. Anyone with allergy or hypersensitivity to the recommended medications. 2. Anyone taking anticoagulants (Blood Thinners) (e.g., Coumadin, Warfarin, Plavix, etc.). 3. Patients in Renal Failure.  Technique: Before you undertake an activity known to  cause pain, or a flare-up of your chronic pain, and before you experience any pain, do the following:  1. On a full stomach, take 4 (four) over the counter Ibuprofens 200mg  tablets (Motrin), for a total of 800 mg. 2. In addition, take over the counter Magnesium 400 to 500 mg, before doing the activity.  3. Six (6) hours later, again on a full stomach, repeat the Ibuprofen. 4. That night, take a warm shower and stretch under the running warm water.  This technique may be sufficient to abort the pain and discomfort before it happens. Keep in mind that it takes a lot less medication to prevent swelling than it takes to eliminate it once it occurs.  ____________________________________________________________________________________________   ____________________________________________________________________________________________  Preparing for Procedure with Sedation  Procedure appointments are limited to planned procedures: . No Prescription Refills. . No disability issues will be discussed. . No medication changes will be discussed.  Instructions: . Oral Intake: Do not eat or drink anything for at least 8 hours prior to your procedure. (Exception: Blood Pressure Medication. See below.) . Transportation: Unless otherwise stated by your physician, you may drive yourself after the procedure. . Blood Pressure Medicine: Do not forget to take your blood pressure medicine with a sip of water the morning of the procedure. If your Diastolic (lower reading)is above 100 mmHg, elective cases will be cancelled/rescheduled. . Blood thinners: These will need to be stopped for procedures. Notify our staff if you are taking any blood thinners. Depending on which one you take, there will be specific instructions on how and when to stop it. . Diabetics on insulin: Notify the staff so that you can be scheduled 1st case in the morning. If your diabetes requires high dose insulin, take only  of your normal  insulin dose the morning of the procedure and notify the staff that you have done so. . Preventing infections: Shower with an antibacterial soap the morning of your procedure. . Build-up your immune system: Take 1000 mg of Vitamin C with every meal (3 times a day) the day prior to your procedure. Marland Kitchen Antibiotics: Inform the staff if you have a condition or reason that requires you to take antibiotics before dental procedures. . Pregnancy: If you are pregnant, call and cancel the procedure. . Sickness: If you have a cold, fever, or any active infections, call and cancel the procedure. . Arrival: You must be in the facility at least 30 minutes prior to your scheduled procedure. . Children: Do not bring children with you. . Dress appropriately: Bring dark clothing that you would not mind if they get stained. . Valuables: Do not bring any jewelry or valuables.  Reasons to call and reschedule or cancel your procedure: (Following these recommendations will minimize the risk of a serious complication.) . Surgeries: Avoid having procedures within 2 weeks of any surgery. (Avoid for 2 weeks before or after any surgery). . Flu Shots: Avoid having procedures within 2 weeks of a flu shots or . (Avoid for 2 weeks before or after immunizations). . Barium: Avoid having a procedure within 7-10 days after having had a radiological study involving the use of radiological contrast. (Myelograms, Barium swallow or enema study). . Heart attacks: Avoid any elective procedures or surgeries for the initial 6 months after a "Myocardial Infarction" (Heart Attack). . Blood thinners: It is imperative that you stop these medications before procedures. Let us know if you if you take any blood thinner.  . Infection: Avoid procedures during or within two weeks of an infection (including chest colds or gastrointestinal problems). Symptoms associated with infections include: Localized redness, fever, chills, night sweats or profuse  sweating, burning sensation when voiding, cough, congestion, stuffiness, runny nose, sore throat, diarrhea, nausea, vomiting, cold or Flu symptoms, recent or current infections. It is specially important if the infection is over the area that we intend to treat. Marland Kitchen Heart and lung problems: Symptoms that may suggest an active cardiopulmonary problem include: cough, chest pain, breathing difficulties or shortness of breath, dizziness, ankle swelling, uncontrolled high or unusually low blood pressure, and/or palpitations. If you are experiencing any of these symptoms, cancel your procedure and contact your primary care physician for an evaluation.  Remember:  Regular Business hours are:  Monday to Thursday 8:00 AM to 4:00 PM  Provider's Schedule: Milinda Pointer, MD:  Procedure days: Tuesday and Thursday 7:30 AM to 4:00 PM  Gillis Santa, MD:  Procedure days: Monday and Wednesday 7:30 AM to 4:00 PM ____________________________________________________________________________________________

## 2020-05-14 NOTE — Progress Notes (Signed)
+  Nursing Pain Medication Assessment:  Safety precautions to be maintained throughout the outpatient stay will include: orient to surroundings, keep bed in low position, maintain call bell within reach at all times, provide assistance with transfer out of bed and ambulation.  Medication Inspection Compliance: Jose Washington did not comply with our request to bring his pills to be counted. He was reminded that bringing the medication bottles, even when empty, is a requirement.  Medication: None brought in. Pill/Patch Count: None available to be counted. Bottle Appearance: No container available. Did not bring bottle(s) to appointment. Filled Date: N/A Last Medication intake:  YesterdaySafety precautions to be maintained throughout the outpatient stay will include: orient to surroundings, keep bed in low position, maintain call bell within reach at all times, provide assistance with transfer out of bed and ambulation.

## 2020-06-05 ENCOUNTER — Ambulatory Visit: Payer: 59 | Admitting: Pain Medicine

## 2020-06-06 ENCOUNTER — Other Ambulatory Visit: Payer: Self-pay | Admitting: Family Medicine

## 2020-06-14 ENCOUNTER — Other Ambulatory Visit: Payer: Self-pay | Admitting: Family Medicine

## 2020-06-14 NOTE — Telephone Encounter (Signed)
Requested Prescriptions  Pending Prescriptions Disp Refills   omeprazole (PRILOSEC) 20 MG capsule [Pharmacy Med Name: OMEPRAZOLE DR 20 MG CAPSULE] 90 capsule 1    Sig: TAKE 1 CAPSULE BY MOUTH EVERY DAY     Gastroenterology: Proton Pump Inhibitors Passed - 06/14/2020  8:58 AM      Passed - Valid encounter within last 12 months    Recent Outpatient Visits          5 months ago Essential hypertension, benign   [facility] American, [place]   6 months ago COVID-19 virus infection   [facility], [place], [place]   1 year ago Essential hypertension, benign   [place], [name], [place]   1 year ago Essential hypertension, benign   [facility], [name], [place]   2 years ago Healthcare maintenance   [place], NP      Future Appointments            In 6 days Orene Desanctis, [name], [place], [place]

## 2020-06-19 ENCOUNTER — Ambulatory Visit: Payer: 59 | Admitting: Family Medicine

## 2020-06-20 ENCOUNTER — Encounter: Payer: Self-pay | Admitting: Family Medicine

## 2020-06-20 ENCOUNTER — Ambulatory Visit (INDEPENDENT_AMBULATORY_CARE_PROVIDER_SITE_OTHER): Payer: 59 | Admitting: Family Medicine

## 2020-06-20 ENCOUNTER — Other Ambulatory Visit: Payer: Self-pay

## 2020-06-20 VITALS — BP 118/77 | HR 67 | Temp 98.4°F | Wt 180.0 lb

## 2020-06-20 DIAGNOSIS — I10 Essential (primary) hypertension: Secondary | ICD-10-CM | POA: Diagnosis not present

## 2020-06-20 DIAGNOSIS — E785 Hyperlipidemia, unspecified: Secondary | ICD-10-CM

## 2020-06-20 DIAGNOSIS — G894 Chronic pain syndrome: Secondary | ICD-10-CM | POA: Diagnosis not present

## 2020-06-20 DIAGNOSIS — K219 Gastro-esophageal reflux disease without esophagitis: Secondary | ICD-10-CM | POA: Diagnosis not present

## 2020-06-20 DIAGNOSIS — F419 Anxiety disorder, unspecified: Secondary | ICD-10-CM | POA: Diagnosis not present

## 2020-06-20 DIAGNOSIS — F325 Major depressive disorder, single episode, in full remission: Secondary | ICD-10-CM | POA: Diagnosis not present

## 2020-06-20 DIAGNOSIS — R69 Illness, unspecified: Secondary | ICD-10-CM | POA: Diagnosis not present

## 2020-06-20 MED ORDER — SUCRALFATE 1 G PO TABS
1.0000 g | ORAL_TABLET | Freq: Two times a day (BID) | ORAL | 1 refills | Status: DC
Start: 2020-06-20 — End: 2020-12-24

## 2020-06-20 MED ORDER — ONDANSETRON HCL 8 MG PO TABS
8.0000 mg | ORAL_TABLET | Freq: Two times a day (BID) | ORAL | 5 refills | Status: DC
Start: 2020-06-20 — End: 2020-12-24

## 2020-06-20 MED ORDER — OMEPRAZOLE 20 MG PO CPDR
20.0000 mg | DELAYED_RELEASE_CAPSULE | Freq: Every day | ORAL | 1 refills | Status: DC
Start: 2020-06-20 — End: 2020-12-24

## 2020-06-20 MED ORDER — ATENOLOL 50 MG PO TABS
50.0000 mg | ORAL_TABLET | Freq: Every day | ORAL | 1 refills | Status: DC
Start: 2020-06-20 — End: 2020-12-14

## 2020-06-20 MED ORDER — BUSPIRONE HCL 30 MG PO TABS
30.0000 mg | ORAL_TABLET | Freq: Two times a day (BID) | ORAL | 1 refills | Status: DC
Start: 2020-06-20 — End: 2020-12-24

## 2020-06-20 MED ORDER — HYDROXYZINE HCL 25 MG PO TABS
25.0000 mg | ORAL_TABLET | Freq: Three times a day (TID) | ORAL | 1 refills | Status: DC | PRN
Start: 2020-06-20 — End: 2020-12-14

## 2020-06-20 MED ORDER — FENOFIBRATE 160 MG PO TABS
160.0000 mg | ORAL_TABLET | Freq: Every day | ORAL | 1 refills | Status: DC
Start: 2020-06-20 — End: 2020-12-24

## 2020-06-20 MED ORDER — DULOXETINE HCL 30 MG PO CPEP
30.0000 mg | ORAL_CAPSULE | Freq: Two times a day (BID) | ORAL | 1 refills | Status: DC
Start: 2020-06-20 — End: 2020-12-24

## 2020-06-20 MED ORDER — ATORVASTATIN CALCIUM 40 MG PO TABS
40.0000 mg | ORAL_TABLET | Freq: Every day | ORAL | 1 refills | Status: DC
Start: 2020-06-20 — End: 2020-12-24

## 2020-06-20 NOTE — Assessment & Plan Note (Signed)
Stable, well controlled. Continue per Pain Mgmt

## 2020-06-20 NOTE — Assessment & Plan Note (Signed)
Stable, well controlled and WNL. Continue present medication and lifestyle modifications

## 2020-06-20 NOTE — Assessment & Plan Note (Signed)
Stable and under good control, continue present medications

## 2020-06-20 NOTE — Progress Notes (Signed)
BP 118/77    Pulse 67    Temp 98.4 F (36.9 C) (Oral)    Wt 180 lb (81.6 kg)    SpO2 96%    BMI 28.19 kg/m    Subjective:    Patient ID: Jose Washington, male    DOB: 11/19/1965, 54 y.o.   MRN: [id number]  HPI: Jose Washington is a 54 y.o. male  Chief Complaint  Patient presents with   Hypertension   Hyperlipidemia   Anxiety   Here today for 6 month f/u chronic conditions.   HTN - Home BPs running 120s/70s range consistently. Taking atenolol faithfully without side effects. Denies CP, SOB, HAs, dizziness. Trying to eat healthy and exercise often.   HLD - Taking atorvastatin faithfully, denies CP, myalgias, claudication.   Anxiety and depression - stable and well controlled on buspar, cymablta and hydroxyzine regimen. Still gets overwhelmed at times particularly at work but overall feels he's managing things well.   Chronic pain issues managed by Pain Mgmt, feels things continue to be under good control with regard to these.   GERD/hx of gastritis - takes zofran, carafate, and prilosec daily which keeps sxs under excellent control  Relevant past medical, surgical, family and social history reviewed and updated as indicated. Interim medical history since our last visit reviewed. Allergies and medications reviewed and updated.  Review of Systems  Per HPI unless specifically indicated above     Objective:    BP 118/77    Pulse 67    Temp 98.4 F (36.9 C) (Oral)    Wt 180 lb (81.6 kg)    SpO2 96%    BMI 28.19 kg/m   Wt Readings from Last 3 Encounters:  06/20/20 180 lb (81.6 kg)  05/14/20 185 lb (83.9 kg)  03/13/20 185 lb (83.9 kg)    Physical Exam Vitals and nursing note reviewed.  Constitutional:      Appearance: Normal appearance.  HENT:     Head: Atraumatic.  Eyes:     Extraocular Movements: Extraocular movements intact.     Conjunctiva/sclera: Conjunctivae normal.  Cardiovascular:     Rate and Rhythm: Normal rate and regular rhythm.  Pulmonary:      Effort: Pulmonary effort is normal.     Breath sounds: Normal breath sounds.  Musculoskeletal:        General: Normal range of motion.     Cervical back: Normal range of motion and neck supple.  Skin:    General: Skin is warm and dry.  Neurological:     General: No focal deficit present.     Mental Status: He is oriented to person, place, and time.  Psychiatric:        Mood and Affect: Mood normal.        Thought Content: Thought content normal.        Judgment: Judgment normal.     Results for orders placed or performed in visit on 03/26/20  ToxASSURE Select 13 (MW), Urine  Result Value Ref Range   Summary Note       Assessment & Plan:   Problem List Items Addressed This Visit      Cardiovascular and Mediastinum   Benign essential hypertension - Primary    Stable, well controlled and WNL. Continue present medication and lifestyle modifications      Relevant Medications   fenofibrate 160 MG tablet   atorvastatin (LIPITOR) 40 MG tablet   atenolol (TENORMIN) 50 MG tablet   Other Relevant Orders  Comprehensive metabolic panel     Digestive   GERD (gastroesophageal reflux disease)    Stable and well controlled, continue present medications      Relevant Medications   sucralfate (CARAFATE) 1 g tablet   ondansetron (ZOFRAN) 8 MG tablet   omeprazole (PRILOSEC) 20 MG capsule     Other   Chronic pain syndrome (Chronic)    Stable, well controlled. Continue per Pain Mgmt      Relevant Medications   DULoxetine (CYMBALTA) 30 MG capsule   Hyperlipidemia    Recheck lipids, adjust as needed. Continue lifestyle modifications and lipitor regimen      Relevant Medications   fenofibrate 160 MG tablet   atorvastatin (LIPITOR) 40 MG tablet   atenolol (TENORMIN) 50 MG tablet   Other Relevant Orders   Lipid Panel w/o Chol/HDL Ratio   Major depression in remission (HCC)    Stable and under good control, continue present medications      Relevant Medications    hydrOXYzine (ATARAX/VISTARIL) 25 MG tablet   DULoxetine (CYMBALTA) 30 MG capsule   busPIRone (BUSPAR) 30 MG tablet   Anxiety    Stable and well controlled, continue present medications      Relevant Medications   hydrOXYzine (ATARAX/VISTARIL) 25 MG tablet   DULoxetine (CYMBALTA) 30 MG capsule   busPIRone (BUSPAR) 30 MG tablet       Follow up plan: Return in about 6 months (around 12/21/2020) for CPE.

## 2020-06-20 NOTE — Assessment & Plan Note (Signed)
Stable and well controlled, continue present medications

## 2020-06-20 NOTE — Assessment & Plan Note (Signed)
Recheck lipids, adjust as needed. Continue lifestyle modifications and lipitor regimen

## 2020-06-21 LAB — COMPREHENSIVE METABOLIC PANEL
ALT: 34 IU/L (ref 0–44)
AST: 32 IU/L (ref 0–40)
Albumin/Globulin Ratio: 1.9 (ref 1.2–2.2)
Albumin: 4.5 g/dL (ref 3.8–4.9)
Alkaline Phosphatase: 63 IU/L (ref 48–121)
BUN/Creatinine Ratio: 12 (ref 9–20)
BUN: 12 mg/dL (ref 6–24)
Bilirubin Total: 0.6 mg/dL (ref 0.0–1.2)
CO2: 26 mmol/L (ref 20–29)
Calcium: 9.5 mg/dL (ref 8.7–10.2)
Chloride: 102 mmol/L (ref 96–106)
Creatinine, Ser: 0.97 mg/dL (ref 0.76–1.27)
GFR calc Af Amer: 102 mL/min/{1.73_m2} (ref >59)
GFR calc non Af Amer: 88 mL/min/{1.73_m2} (ref >59)
Globulin, Total: 2.4 g/dL (ref 1.5–4.5)
Glucose: 95 mg/dL (ref 65–99)
Potassium: 4.4 mmol/L (ref 3.5–5.2)
Sodium: 138 mmol/L (ref 134–144)
Total Protein: 6.9 g/dL (ref 6.0–8.5)

## 2020-06-21 LAB — LIPID PANEL W/O CHOL/HDL RATIO
Cholesterol, Total: 213 mg/dL — ABNORMAL HIGH (ref 100–199)
HDL: 34 mg/dL — ABNORMAL LOW (ref >39)
LDL Chol Calc (NIH): 136 mg/dL — ABNORMAL HIGH (ref 0–99)
Triglycerides: 239 mg/dL — ABNORMAL HIGH (ref 0–149)
VLDL Cholesterol Cal: 43 mg/dL — ABNORMAL HIGH (ref 5–40)

## 2020-06-25 ENCOUNTER — Ambulatory Visit: Payer: 59 | Admitting: Pain Medicine

## 2020-08-07 ENCOUNTER — Ambulatory Visit: Payer: 59 | Admitting: Pain Medicine

## 2020-08-10 ENCOUNTER — Other Ambulatory Visit: Payer: Self-pay | Admitting: Family Medicine

## 2020-08-19 NOTE — Progress Notes (Signed)
PROVIDER NOTE: Information contained herein reflects review and annotations entered in association with encounter. Interpretation of such information and data should be left to medically-trained personnel. Information provided to patient can be located elsewhere in the medical record under "Patient Instructions". Document created using STT-dictation technology, any transcriptional errors that may result from process are unintentional.    Patient: Jose Washington  Service Category: E/M  Provider: Gaspar Cola, MD  DOB: 1966/10/16  DOS: 08/20/2020  Specialty: Interventional Pain Management  MRN: [id number]  Setting: Ambulatory outpatient  PCP: Volney American, PA-C  Type: Established Patient    Referring Provider: Volney American,*  Location: Office  Delivery: Face-to-face     HPI  Jose Washington, a 54 y.o. year old male, is here today because of his Chronic pain syndrome [G89.4]. Mr. Zagal primary complain today is Neck Pain (left) and Back Pain (mid back right) Last encounter: My last encounter with him was on 05/14/2020. Pertinent problems: Mr. Fiveash has Chronic neck pain (Third area of Pain) (Bilateral) (L>R); Chronic pain syndrome; Chronic low back pain (Primary Area of Pain) (Bilateral) (L>R) w/o sciatica ; Chronic upper back pain (Fourth area of Pain) (Bilateral) (L>R); Chronic shoulder pain (Bilateral) (L>R); Osteoarthritis of AC (acromioclavicular) joint (Right); Chronic sacroiliac joint pain (Bilateral) (L>R); Muscle spasticity; Cervical DDD (C4-5, C5-6, C6-7 and C7-T1); Cervical foraminal stenosis (Bilateral: C5-6 & C6-7, Left: C4-5 & C7-T1); Cervical radiculitis (Bilateral) (L>R); Cervical facet syndrome (Bilateral) (L>R); Cervical spondylosis; Musculoskeletal neck pain (trapezius) (Left); Chronic knee pain (Left); Chronic lower extremity pain (Secondary area of Pain) (Left); Grade 1 Retrolisthesis of L3 over L4; Lumbar facet arthropathy (Bilateral); Lumbar facet  osteoarthritis; Lumbar facet syndrome (Bilateral) (R>L); Lumbar spondylosis; Chronic right shoulder pain; Suprascapular neuropathy, right; DDD (degenerative disc disease), lumbosacral; Lumbar facet hypertrophy (Multilevel) (Bilateral); Spondylosis without myelopathy or radiculopathy, lumbosacral region; Inflammatory spondylopathy of lumbosacral region [facility]); Chronic lower extremity pain (Right); Chronic musculoskeletal pain; Abnormal MRI, lumbar spine (09/07/2017); Acute postoperative pain; Cervicalgia (Bilateral) (L>R); Spondylosis without myelopathy or radiculopathy, cervical region; Myofascial pain syndrome of thoracic spine (serratus muscle) (Right); and Chronic flank pain (Right) on their pertinent problem list. Pain Assessment: Severity of Chronic pain is reported as a 3 /10. Location: Back (neck pain is in the left side , does not radiate, stiffness) Lower, Mid/radiates into right buttock. Onset: More than a month ago. Quality: Aching. Timing: Constant. Modifying factor(s): stretching, massage, resting , tylenol pain med. Vitals:  height is _0  (1.702 m) and weight is 185 lb (83.9 kg). His temperature is 97.9 F (36.6 C). His blood pressure is 152/93 (abnormal) and his pulse is 120 (abnormal). His respiration is 16 and oxygen saturation is 100%.   Reason for encounter: medication management.  The patient indicates doing well with the current medication regimen. No adverse reactions or side effects reported to the medications.  RTCB: 11/26/2020 Nonopioids transfer 08/20/2020: Baclofen  Pharmacotherapy Assessment   Analgesic: Hydrocodone/APAP 5/325, 1 tab PO QD (5 mg/day of hydrocodone) MME/day: 5 mg/day.   Monitoring: [place] PMP: PDMP reviewed during this encounter.       Pharmacotherapy: No side-effects or adverse reactions reported. Compliance: No problems identified. Effectiveness: Clinically acceptable.  Dewayne Shorter, RN  08/20/2020  1:59 PM  Signed Nursing Pain Medication Assessment:   Safety precautions to be maintained throughout the outpatient stay will include: orient to surroundings, keep bed in low position, maintain call bell within reach at all times, provide assistance with transfer out of bed and ambulation.  Medication Inspection Compliance: Pill count conducted under aseptic conditions, in front of the patient. Neither the pills nor the bottle was removed from the patient's sight at any time. Once count was completed pills were immediately returned to the patient in their original bottle.  Medication: Hydrocodone/APAP Pill/Patch Count: 10 of 30 pills remain Pill/Patch Appearance: Markings consistent with prescribed medication Bottle Appearance: Standard pharmacy container. Clearly labeled. Filled Date: 10 / 03/ 2021 Last Medication intake:  Yesterday    UDS:  Summary  Date Value Ref Range Status  03/27/2020 Note  Final    Comment:    ==================================================================== ToxASSURE Select 13 (MW) ==================================================================== Test                             Result       Flag       Units Drug Present and Declared for Prescription Verification   Hydrocodone                    105          EXPECTED   ng/mg creat   Dihydrocodeine                 76           EXPECTED   ng/mg creat   Norhydrocodone                 267          EXPECTED   ng/mg creat    Sources of hydrocodone include scheduled prescription medications.    Dihydrocodeine and norhydrocodone are expected metabolites of    hydrocodone. Dihydrocodeine is also available as a scheduled    prescription medication. Drug Present not Declared for Prescription Verification   Carboxy-THC                    15           UNEXPECTED ng/mg creat    Carboxy-THC is a metabolite of tetrahydrocannabinol (THC). Source of    THC is most commonly herbal marijuana or marijuana-based products,    but THC is also present in a scheduled prescription  medication.    Trace amounts of THC can be present in hemp and cannabidiol (CBD)    products. This test is not intended to distinguish between delta-9-    tetrahydrocannabinol, the predominant form of THC in most herbal or    marijuana-based products, and delta-8-tetrahydrocannabinol. ==================================================================== Test                      Result    Flag   Units      Ref Range   Creatinine              213              mg/dL      >=20 ==================================================================== Declared Medications:  The flagging and interpretation on this report are based on the  following declared medications.  Unexpected results may arise from  inaccuracies in the declared medications.  **Note: The testing scope of this panel includes these medications:  Hydrocodone (Norco)  **Note: The testing scope of this panel does not include the  following reported medications:  Acetaminophen (Tylenol)  Acetaminophen (Norco)  Albuterol (Ventolin HFA)  Atenolol (Tenormin)  Atorvastatin (Lipitor)  Baclofen (Lioresal)  Buspirone (Buspar)  Chondroitin  Cyanocobalamin  Desloratadine (Clarinex)  Fenofibrate  Fluticasone (  Flonase)  Glucosamine  Hydroxyzine (Atarax)  Ibuprofen (Advil)  Iron  Magnesium  Melatonin  Methylsulfonylmethane  Multivitamin  Omeprazole (Prilosec)  Ondansetron (Zofran)  Potassium (Klor-Con)  Simethicone  Sucralfate (Carafate)  Vitamin C  Vitamin D3 ==================================================================== For clinical consultation, please call [phone]. ====================================================================      ROS  Constitutional: Denies any fever or chills Gastrointestinal: No reported hemesis, hematochezia, vomiting, or acute GI distress Musculoskeletal: Denies any acute onset joint swelling, redness, loss of ROM, or weakness Neurological: No reported episodes of acute onset  apraxia, aphasia, dysarthria, agnosia, amnesia, paralysis, loss of coordination, or loss of consciousness  Medication Review  Calcium Carbonate, DULoxetine, Elderberry, Glucosamine-Chondroit-Vit C-Mn, HYDROcodone-acetaminophen, Methylsulfonylmethane, Potassium, Vitamin D3, acetaminophen, albuterol, atenolol, atorvastatin, baclofen, busPIRone, cannabidiol, desloratadine-pseudoephedrine, diphenhydrAMINE HCl, fenofibrate, fluticasone, hydrOXYzine, ibuprofen, magnesium oxide, melatonin, multivitamin, omeprazole, ondansetron, simethicone, sucralfate, vitamin B-12, and vitamin E  History Review  Allergy: Mr. Geers has No Known Allergies. Drug: Mr. Kelleher  reports no history of drug use. Alcohol:  reports no history of alcohol use. Tobacco:  reports that he has never smoked. He has never used smokeless tobacco. Social: Mr. Muldrew  reports that he has never smoked. He has never used smokeless tobacco. He reports that he does not drink alcohol and does not use drugs. Medical:  has a past medical history of Allergy, Anxiety, Chronic duodenal ulcer with hemorrhage (2012), Chronic neck pain, Depression, Hyperlipidemia, Hypertension, and Microscopic hematuria. Surgical: Mr. Goehring  has a past surgical history that includes eye muscle repair [phone] and 1975) and bLEEDING ULCER (2012). Family: family history includes Allergies in his brother; Cancer in his maternal grandfather and mother; Dementia in his father and paternal grandfather; Depression in his father; Diabetes in his mother; Mental illness in his father; Stroke in his paternal grandfather.  Laboratory Chemistry Profile   Renal Lab Results  Component Value Date   BUN 12 06/20/2020   CREATININE 0.97 06/20/2020   BCR 12 06/20/2020   GFRAA 102 06/20/2020   GFRNONAA 88 06/20/2020     Hepatic Lab Results  Component Value Date   AST 32 06/20/2020   ALT 34 06/20/2020   ALBUMIN 4.5 06/20/2020   ALKPHOS 63 06/20/2020   LIPASE 29 08/14/2019      Electrolytes Lab Results  Component Value Date   NA 138 06/20/2020   K 4.4 06/20/2020   CL 102 06/20/2020   CALCIUM 9.5 06/20/2020   MG 2.0 11/12/2016     Bone Lab Results  Component Value Date   VD25OH 40.5 12/21/2019   25OHVITD1 23 (L) 11/12/2016   25OHVITD2 <1.0 11/12/2016   25OHVITD3 23 11/12/2016     Inflammation (CRP: Acute Phase) (ESR: Chronic Phase) Lab Results  Component Value Date   CRP 1.4 (H) 11/12/2016   ESRSEDRATE 3 11/12/2016       Note: Above Lab results reviewed.  Recent Imaging Review  DG PAIN CLINIC C-ARM 1-60 MIN NO REPORT Fluoro was used, but no Radiologist interpretation will be provided.  Please refer to "NOTES" tab for provider progress note. Note: Reviewed        Physical Exam  General appearance: Well nourished, well developed, and well hydrated. In no apparent acute distress Mental status: Alert, oriented x 3 (person, place, & time)       Respiratory: No evidence of acute respiratory distress Eyes: PERLA Vitals: BP (!) 152/93    Pulse (!) 120    Temp 97.9 F (36.6 C)    Resp 16    Ht _0  (  1.702 m)    Wt 185 lb (83.9 kg)    SpO2 100%    BMI 28.98 kg/m  BMI: Estimated body mass index is 28.98 kg/m as calculated from the following:   Height as of this encounter: _0  (1.702 m).   Weight as of this encounter: 185 lb (83.9 kg). Ideal: Ideal body weight: 66.1 kg (145 lb 11.6 oz) Adjusted ideal body weight: 73.2 kg (161 lb 6.9 oz)  Assessment   Status Diagnosis  Controlled Controlled Controlled 1. Chronic pain syndrome   2. Chronic low back pain (Primary Area of Pain) (Bilateral) (L>R) w/o sciatica    3. Chronic lower extremity pain (Secondary area of Pain) (Left)   4. Chronic right shoulder pain   5. Cervicalgia (Bilateral) (L>R)   6. Pharmacologic therapy   7. Muscle spasticity   8. Chronic musculoskeletal pain      Updated Problems: No problems updated.  Plan of Care  Problem-specific:  No problem-specific Assessment  & Plan notes found for this encounter.  Mr. ATHONY COPPA has a current medication list which includes the following long-term medication(s): albuterol, atenolol, atorvastatin, calcium carbonate, diphenhydramine hcl, duloxetine, fenofibrate, omeprazole, potassium, simethicone, sucralfate, [START ON 09/27/2020] baclofen, [START ON 08/28/2020] hydrocodone-acetaminophen, [START ON 09/27/2020] hydrocodone-acetaminophen, and [START ON 10/27/2020] hydrocodone-acetaminophen.  Pharmacotherapy (Medications Ordered): Meds ordered this encounter  Medications   baclofen (LIORESAL) 10 MG tablet    Sig: Take 1 tablet (10 mg total) by mouth at bedtime.    Dispense:  90 tablet    Refill:  0    Fill one day early if pharmacy is closed on scheduled refill date. May substitute for generic if available.   HYDROcodone-acetaminophen (NORCO/VICODIN) 5-325 MG tablet    Sig: Take 1 tablet by mouth daily as needed for severe pain. Must last 30 days.    Dispense:  30 tablet    Refill:  0    Chronic Pain: STOP Act (Not applicable) Fill 1 day early if closed on refill date. Avoid benzodiazepines within 8 hours of opioids   HYDROcodone-acetaminophen (NORCO/VICODIN) 5-325 MG tablet    Sig: Take 1 tablet by mouth daily as needed for severe pain. Must last 30 days.    Dispense:  30 tablet    Refill:  0    Chronic Pain: STOP Act (Not applicable) Fill 1 day early if closed on refill date. Avoid benzodiazepines within 8 hours of opioids   HYDROcodone-acetaminophen (NORCO/VICODIN) 5-325 MG tablet    Sig: Take 1 tablet by mouth daily as needed for severe pain. Must last 30 days.    Dispense:  30 tablet    Refill:  0    Chronic Pain: STOP Act (Not applicable) Fill 1 day early if closed on refill date. Avoid benzodiazepines within 8 hours of opioids   Orders:  Orders Placed This Encounter  Procedures   ToxASSURE Select 13 (MW), Urine    Volume: 30 ml(s). Minimum 3 ml of urine is needed. Document temperature of  fresh sample. Indications: Long term (current) use of opiate analgesic (N23.557)    Order Specific Question:   Release to patient    Answer:   Immediate   Follow-up plan:   Return in about 14 weeks (around 11/26/2020) for (F2F), (Med Mgmt).      Interventional treatment options:  Under consideration: Possible left lumbar facet RFA  Diagnostic left cervical facetblock  Possible left cervical facet RFA  Diagnostic bilateral sacroiliac joint block Possible bilateral sacroiliac joint RFA Diagnostic trigger point  injections   Therapeutic/palliative (PRN): Palliative right lumbar facet RFA #2 (last done 11/22/2019) Palliative right lumbar facetblock #3  Diagnostic/therapeutic left lumbar facetblock #2  Diagnostic/therapeutic left [id number]#3 Palliative left CESI#4 Diagnostic right-sided cervical facet block #2 (last done 03/13/2020) (100/100/100) Diagnostic left-sided cervical facet block #2 (last done 01/10/2020) (100/100/100)      Recent Visits No visits were found meeting these conditions. Showing recent visits within past 90 days and meeting all other requirements Today's Visits Date Type Provider Dept  08/20/20 Office Visit Milinda Pointer, MD [facility]  Showing today's visits and meeting all other requirements Future Appointments No visits were found meeting these conditions. Showing future appointments within next 90 days and meeting all other requirements  I discussed the assessment and treatment plan with the patient. The patient was provided an opportunity to ask questions and all were answered. The patient agreed with the plan and demonstrated an understanding of the instructions.  Patient advised to call back or seek an in-person evaluation if the symptoms or condition worsens.  Duration of encounter: 30 minutes.  Note by: Gaspar Cola, MD Date: 08/20/2020; Time: 3:12 PM

## 2020-08-20 ENCOUNTER — Encounter: Payer: Self-pay | Admitting: Pain Medicine

## 2020-08-20 ENCOUNTER — Ambulatory Visit: Payer: 59 | Attending: Pain Medicine | Admitting: Pain Medicine

## 2020-08-20 ENCOUNTER — Other Ambulatory Visit: Payer: Self-pay

## 2020-08-20 VITALS — BP 152/93 | HR 120 | Temp 97.9°F | Resp 16 | Ht 67.0 in | Wt 185.0 lb

## 2020-08-20 DIAGNOSIS — G8929 Other chronic pain: Secondary | ICD-10-CM | POA: Insufficient documentation

## 2020-08-20 DIAGNOSIS — G894 Chronic pain syndrome: Secondary | ICD-10-CM | POA: Diagnosis not present

## 2020-08-20 DIAGNOSIS — M542 Cervicalgia: Secondary | ICD-10-CM | POA: Insufficient documentation

## 2020-08-20 DIAGNOSIS — M7918 Myalgia, other site: Secondary | ICD-10-CM | POA: Insufficient documentation

## 2020-08-20 DIAGNOSIS — M79605 Pain in left leg: Secondary | ICD-10-CM | POA: Diagnosis not present

## 2020-08-20 DIAGNOSIS — M62838 Other muscle spasm: Secondary | ICD-10-CM | POA: Diagnosis not present

## 2020-08-20 DIAGNOSIS — Z79899 Other long term (current) drug therapy: Secondary | ICD-10-CM | POA: Insufficient documentation

## 2020-08-20 DIAGNOSIS — M25511 Pain in right shoulder: Secondary | ICD-10-CM | POA: Diagnosis not present

## 2020-08-20 DIAGNOSIS — M545 Low back pain, unspecified: Secondary | ICD-10-CM | POA: Diagnosis not present

## 2020-08-20 MED ORDER — HYDROCODONE-ACETAMINOPHEN 5-325 MG PO TABS: 1.0000 | ORAL_TABLET | Freq: Every day | ORAL | 0 refills | Status: DC | PRN

## 2020-08-20 MED ORDER — BACLOFEN 10 MG PO TABS: 10.0000 mg | ORAL_TABLET | Freq: Every day | ORAL | 0 refills | Status: DC

## 2020-08-20 NOTE — Progress Notes (Signed)
Nursing Pain Medication Assessment:  Safety precautions to be maintained throughout the outpatient stay will include: orient to surroundings, keep bed in low position, maintain call bell within reach at all times, provide assistance with transfer out of bed and ambulation.  Medication Inspection Compliance: Pill count conducted under aseptic conditions, in front of the patient. Neither the pills nor the bottle was removed from the patient's sight at any time. Once count was completed pills were immediately returned to the patient in their original bottle.  Medication: Hydrocodone/APAP Pill/Patch Count: 10 of 30 pills remain Pill/Patch Appearance: Markings consistent with prescribed medication Bottle Appearance: Standard pharmacy container. Clearly labeled. Filled Date: 10 / 03/ 2021 Last Medication intake:  Yesterday

## 2020-08-20 NOTE — Patient Instructions (Signed)
____________________________________________________________________________________________  CBD (cannabidiol) WARNING  Applicable to: All individuals currently taking or considering taking CBD (cannabidiol) and, more important, all patients taking opioid analgesic controlled substances (pain medication). (Example: oxycodone; oxymorphone; hydrocodone; hydromorphone; morphine; methadone; tramadol; tapentadol; fentanyl; buprenorphine; butorphanol; dextromethorphan; meperidine; codeine; etc.)  Legal status: CBD remains a Schedule I drug prohibited for any use. CBD is illegal with one exception. In the [place], CBD has a limited Food and Drug Administration (FDA) approval for the treatment of two specific types of epilepsy disorders. Only one CBD product has been approved by the FDA for this purpose: "Epidiolex". FDA is aware that some companies are marketing products containing cannabis and cannabis-derived compounds in ways that violate the Federal Food, Drug and Cosmetic Act (FD&C Act) and that may put the health and safety of consumers at risk. The FDA, a Federal agency, has not enforced the CBD status since 2018.   Legality: Some manufacturers ship CBD products nationally, which is illegal. Often such products are sold online and are therefore available throughout the country. CBD is openly sold in head shops and health food stores in some states where such sales have not been explicitly legalized. Selling unapproved products with unsubstantiated therapeutic claims is not only a violation of the law, but also can put patients at risk, as these products have not been proven to be safe or effective. Federal illegality makes it difficult to conduct research on CBD.  Reference: "FDA Regulation of Cannabis and Cannabis-Derived Products, Including Cannabidiol (CBD)" -  https://www.fda.gov/news-events/public-health-focus/fda-regulation-cannabis-and-cannabis-derived-products-including-cannabidiol-cbd  Warning: CBD is not FDA approved and has not undergo the same manufacturing controls as prescription drugs.  This means that the purity and safety of available CBD may be questionable. Most of the time, despite manufacturer's claims, it is contaminated with THC (delta-9-tetrahydrocannabinol - the chemical in marijuana responsible for the "HIGH").  When this is the case, the THC contaminant will trigger a positive urine drug screen (UDS) test for Marijuana (carboxy-THC). Because a positive UDS for any illicit substance is a violation of our medication agreement, your opioid analgesics (pain medicine) may be permanently discontinued.  MORE ABOUT CBD  General Information: CBD  is a derivative of the Marijuana (cannabis sativa) plant discovered in 1940. It is one of the 113 identified substances found in Marijuana. It accounts for up to 40% of the plant's extract. As of 2018, preliminary clinical studies on CBD included research for the treatment of anxiety, movement disorders, and pain. CBD is available and consumed in multiple forms, including inhalation of smoke or vapor, as an aerosol spray, and by mouth. It may be supplied as an oil containing CBD, capsules, dried cannabis, or as a liquid solution. CBD is thought not to be as psychoactive as THC (delta-9-tetrahydrocannabinol - the chemical in marijuana responsible for the "HIGH"). Studies suggest that CBD may interact with different biological target receptors in the body, including cannabinoid and other neurotransmitter receptors. As of 2018 the mechanism of action for its biological effects has not been determined.  Side-effects  Adverse reactions: Dry mouth, diarrhea, decreased appetite, fatigue, drowsiness, malaise, weakness, sleep disturbances, and others.  Drug interactions: CBC may interact with other medications  such as blood-thinners. (Last update: 06/07/2020) ____________________________________________________________________________________________   ____________________________________________________________________________________________  Medication Rules  Purpose: To inform patients, and their family members, of our rules and regulations.  Applies to: All patients receiving prescriptions (written or electronic).  Pharmacy of record: Pharmacy where electronic prescriptions will be sent. If written prescriptions are taken to a different pharmacy, please inform   the nursing staff. The pharmacy listed in the electronic medical record should be the one where you would like electronic prescriptions to be sent.  Electronic prescriptions: In compliance with the [place] Strengthen Opioid Misuse Prevention (STOP) Act of 2017 (Session Law 2017-74/H243), effective November 01, 2018, all controlled substances must be electronically prescribed. Calling prescriptions to the pharmacy will cease to exist.  Prescription refills: Only during scheduled appointments. Applies to all prescriptions.  NOTE: The following applies primarily to controlled substances (Opioid* Pain Medications).   Type of encounter (visit): For patients receiving controlled substances, face-to-face visits are required. (Not an option or up to the patient.)  Patient's responsibilities: 1. Pain Pills: Bring all pain pills to every appointment (except for procedure appointments). 2. Pill Bottles: Bring pills in original pharmacy bottle. Always bring the newest bottle. Bring bottle, even if empty. 3. Medication refills: You are responsible for knowing and keeping track of what medications you take and those you need refilled. The day before your appointment: write a list of all prescriptions that need to be refilled. The day of the appointment: give the list to the admitting nurse. Prescriptions will be written only during  appointments. No prescriptions will be written on procedure days. If you forget a medication: it will not be "Called in", "Faxed", or "electronically sent". You will need to get another appointment to get these prescribed. No early refills. Do not call asking to have your prescription filled early. 4. Prescription Accuracy: You are responsible for carefully inspecting your prescriptions before leaving our office. Have the discharge nurse carefully go over each prescription with you, before taking them home. Make sure that your name is accurately spelled, that your address is correct. Check the name and dose of your medication to make sure it is accurate. Check the number of pills, and the written instructions to make sure they are clear and accurate. Make sure that you are given enough medication to last until your next medication refill appointment. 5. Taking Medication: Take medication as prescribed. When it comes to controlled substances, taking less pills or less frequently than prescribed is permitted and encouraged. Never take more pills than instructed. Never take medication more frequently than prescribed.  6. Inform other Doctors: Always inform, all of your healthcare providers, of all the medications you take. 7. Pain Medication from other Providers: You are not allowed to accept any additional pain medication from any other Doctor or Healthcare provider. There are two exceptions to this rule. (see below) In the event that you require additional pain medication, you are responsible for notifying us, as stated below. 8. Medication Agreement: You are responsible for carefully reading and following our Medication Agreement. This must be signed before receiving any prescriptions from our practice. Safely store a copy of your signed Agreement. Violations to the Agreement will result in no further prescriptions. (Additional copies of our Medication Agreement are available upon request.) 9. Laws, Rules,  & Regulations: All patients are expected to follow all Federal and State Laws, Statutes, Rules, & Regulations. Ignorance of the Laws does not constitute a valid excuse.  10. Illegal drugs and Controlled Substances: The use of illegal substances (including, but not limited to marijuana and its derivatives) and/or the illegal use of any controlled substances is strictly prohibited. Violation of this rule may result in the immediate and permanent discontinuation of any and all prescriptions being written by our practice. The use of any illegal substances is prohibited. 11. Adopted CDC guidelines & recommendations: Target dosing   levels will be at or below 60 MME/day. Use of benzodiazepines** is not recommended.  Exceptions: There are only two exceptions to the rule of not receiving pain medications from other Healthcare Providers. 1. Exception #1 (Emergencies): In the event of an emergency (i.e.: accident requiring emergency care), you are allowed to receive additional pain medication. However, you are responsible for: As soon as you are able, call our office [phone], at any time of the day or night, and leave a message stating your name, the date and nature of the emergency, and the name and dose of the medication prescribed. In the event that your call is answered by a member of our staff, make sure to document and save the date, time, and the name of the person that took your information.  2. Exception #2 (Planned Surgery): In the event that you are scheduled by another doctor or dentist to have any type of surgery or procedure, you are allowed (for a period no longer than 30 days), to receive additional pain medication, for the acute post-op pain. However, in this case, you are responsible for picking up a copy of our "Post-op Pain Management for Surgeons" handout, and giving it to your surgeon or dentist. This document is available at our office, and does not require an appointment to obtain it. Simply  go to our office during business hours (Monday-Thursday from 8:00 AM to 4:00 PM) (Friday 8:00 AM to 12:00 Noon) or if you have a scheduled appointment with us, prior to your surgery, and ask for it by name. In addition, you are responsible for: calling our office [phone], at any time of the day or night, and leaving a message stating your name, name of your surgeon, type of surgery, and date of procedure or surgery. Failure to comply with your responsibilities may result in termination of therapy involving the controlled substances.  *Opioid medications include: morphine, codeine, oxycodone, oxymorphone, hydrocodone, hydromorphone, meperidine, tramadol, tapentadol, buprenorphine, fentanyl, methadone. **Benzodiazepine medications include: diazepam (Valium), alprazolam (Xanax), clonazepam (Klonopine), lorazepam (Ativan), clorazepate (Tranxene), chlordiazepoxide (Librium), estazolam (Prosom), oxazepam (Serax), temazepam (Restoril), triazolam (Halcion) (Last updated: 07/08/2020) ____________________________________________________________________________________________   ____________________________________________________________________________________________  Medication Recommendations and Reminders  Applies to: All patients receiving prescriptions (written and/or electronic).  Medication Rules & Regulations: These rules and regulations exist for your safety and that of others. They are not flexible and neither are we. Dismissing or ignoring them will be considered "non-compliance" with medication therapy, resulting in complete and irreversible termination of such therapy. (See document titled "Medication Rules" for more details.) In all conscience, because of safety reasons, we cannot continue providing a therapy where the patient does not follow instructions.  Pharmacy of record:   Definition: This is the pharmacy where your electronic prescriptions will be sent.   We do not endorse any  particular pharmacy, however, we have experienced problems with [facility] not securing enough medication supply for the community.  We do not restrict you in your choice of pharmacy. However, once we write for your prescriptions, we will NOT be re-sending more prescriptions to fix restricted supply problems created by your pharmacy, or your insurance.   The pharmacy listed in the electronic medical record should be the one where you want electronic prescriptions to be sent.  If you choose to change pharmacy, simply notify our nursing staff.  Recommendations:  Keep all of your pain medications in a safe place, under lock and key, even if you live alone. We will NOT replace lost, stolen, or   damaged medication.  After you fill your prescription, take 1 week's worth of pills and put them away in a safe place. You should keep a separate, properly labeled bottle for this purpose. The remainder should be kept in the original bottle. Use this as your primary supply, until it runs out. Once it's gone, then you know that you have 1 week's worth of medicine, and it is time to come in for a prescription refill. If you do this correctly, it is unlikely that you will ever run out of medicine.  To make sure that the above recommendation works, it is very important that you make sure your medication refill appointments are scheduled at least 1 week before you run out of medicine. To do this in an effective manner, make sure that you do not leave the office without scheduling your next medication management appointment. Always ask the nursing staff to show you in your prescription , when your medication will be running out. Then arrange for the receptionist to get you a return appointment, at least 7 days before you run out of medicine. Do not wait until you have 1 or 2 pills left, to come in. This is very poor planning and does not take into consideration that we may need to cancel appointments due to bad weather,  sickness, or emergencies affecting our staff.  DO NOT ACCEPT A "Partial Fill": If for any reason your pharmacy does not have enough pills/tablets to completely fill or refill your prescription, do not allow for a "partial fill". The law allows the pharmacy to complete that prescription within 72 hours, without requiring a new prescription. If they do not fill the rest of your prescription within those 72 hours, you will need a separate prescription to fill the remaining amount, which we will NOT provide. If the reason for the partial fill is your insurance, you will need to talk to the pharmacist about payment alternatives for the remaining tablets, but again, DO NOT ACCEPT A PARTIAL FILL, unless you can trust your pharmacist to obtain the remainder of the pills within 72 hours.  Prescription refills and/or changes in medication(s):   Prescription refills, and/or changes in dose or medication, will be conducted only during scheduled medication management appointments. (Applies to both, written and electronic prescriptions.)  No refills on procedure days. No medication will be changed or started on procedure days. No changes, adjustments, and/or refills will be conducted on a procedure day. Doing so will interfere with the diagnostic portion of the procedure.  No phone refills. No medications will be "called into the pharmacy".  No Fax refills.  No weekend refills.  No Holliday refills.  No after hours refills.  Remember:  Business hours are:  Monday to Thursday 8:00 AM to 4:00 PM Provider's Schedule: Lexxie Winberg, MD - Appointments are:  Medication management: Monday and Wednesday 8:00 AM to 4:00 PM Procedure day: Tuesday and Thursday 7:30 AM to 4:00 PM Bilal Lateef, MD - Appointments are:  Medication management: Tuesday and Thursday 8:00 AM to 4:00 PM Procedure day: Monday and Wednesday 7:30 AM to 4:00 PM (Last update:  05/21/2020) ____________________________________________________________________________________________    

## 2020-08-23 LAB — TOXASSURE SELECT 13 (MW), URINE

## 2020-10-20 ENCOUNTER — Other Ambulatory Visit: Payer: Self-pay | Admitting: Nurse Practitioner

## 2020-11-11 DIAGNOSIS — Z03818 Encounter for observation for suspected exposure to other biological agents ruled out: Secondary | ICD-10-CM | POA: Diagnosis not present

## 2020-11-11 DIAGNOSIS — Z20822 Contact with and (suspected) exposure to covid-19: Secondary | ICD-10-CM | POA: Diagnosis not present

## 2020-11-24 NOTE — Progress Notes (Signed)
PROVIDER NOTE: Information contained herein reflects review and annotations entered in association with encounter. Interpretation of such information and data should be left to medically-trained personnel. Information provided to patient can be located elsewhere in the medical record under "Patient Instructions". Document created using STT-dictation technology, any transcriptional errors that may result from process are unintentional.    Patient: Jose Washington  Service Category: E/M  Provider: Gaspar Cola, MD  DOB: 1966/04/11  DOS: 11/26/2020  Specialty: Interventional Pain Management  MRN: [id number]  Setting: Ambulatory outpatient  PCP: Jon Billings, NP  Type: Established Patient    Referring Provider: Volney American,*  Location: Office  Delivery: Face-to-face     HPI  Jose Washington, a 55 y.o. year old male, is here today because of his Chronic pain syndrome [G89.4]. Jose Washington primary complain today is Back Pain Last encounter: My last encounter with him was on 08/20/2020. Pertinent problems: Jose Washington has Chronic neck pain (Third area of Pain) (Bilateral) (L>R); Chronic pain syndrome; Chronic low back pain (Primary Area of Pain) (Bilateral) (L>R) w/o sciatica ; Chronic upper back pain (Fourth area of Pain) (Bilateral) (L>R); Chronic shoulder pain (Bilateral) (L>R); Osteoarthritis of AC (acromioclavicular) joint (Right); Chronic sacroiliac joint pain (Bilateral) (L>R); Muscle spasticity; Cervical DDD (C4-5, C5-6, C6-7 and C7-T1); Cervical foraminal stenosis (Bilateral: C5-6 & C6-7, Left: C4-5 & C7-T1); Cervical radiculitis (Bilateral) (L>R); Cervical facet syndrome (Bilateral) (L>R); Cervical spondylosis; Musculoskeletal neck pain (trapezius) (Left); Chronic knee pain (Left); Chronic lower extremity pain (Secondary area of Pain) (Left); Grade 1 Retrolisthesis of L3 over L4; Lumbar facet arthropathy (Bilateral); Lumbar facet osteoarthritis; Lumbar facet syndrome  (Bilateral) (R>L); Lumbar spondylosis; Chronic right shoulder pain; Suprascapular neuropathy, right; DDD (degenerative disc disease), lumbosacral; Lumbar facet hypertrophy (Multilevel) (Bilateral); Spondylosis without myelopathy or radiculopathy, lumbosacral region; Inflammatory spondylopathy of lumbosacral region [facility]); Chronic lower extremity pain (Right); Chronic musculoskeletal pain; Abnormal MRI, lumbar spine (09/07/2017); Acute postoperative pain; Cervicalgia (Bilateral) (L>R); Spondylosis without myelopathy or radiculopathy, cervical region; Myofascial pain syndrome of thoracic spine (serratus muscle) (Right); Chronic flank pain (Right); and Chronic hip pain (Bilateral) on their pertinent problem list. Pain Assessment: Severity of Chronic pain is reported as a 3 /10. Location: Back Lower,Right,Left/Hips/buttocks bilateral, right side worse. Onset: More than a month ago. Quality: Aching,Sharp,Constant,Nagging,Discomfort. Timing: Constant. Modifying factor(s): rest, Tylenol and pain meds, streches, Chiropactor. Vitals:  height is '5\' 7"'  (1.702 m) and weight is 185 lb (83.9 kg). His temperature is 97.4 F (36.3 C) (abnormal). His blood pressure is 113/79 and his pulse is 79. His respiration is 16 and oxygen saturation is 95%.   Reason for encounter: medication management.   The patient indicates doing well with the current medication regimen. No adverse reactions or side effects reported to the medications.  (08/20/2020) UDS (+) for undisclosed carboxy-THC. The patient had previously reported that this was probably secondary to th cannabidiol (Epidiolex) 100 mg/ml (16 mg PO q week) that he was taking.  However, he indicates that he stopped this in November and therefore today we will retest him.  I will give him only a 30-day prescription on his hydrocodone with the understanding that if it test positive again, we will have to discontinue this medication.  He understood and accepted.  In addition to this,  the patient indicated having more pain in the right lower back and buttocks area.  Usually commented that he thought this was coming from his sacroiliac area however, physical exam today proved to be negative for sacroiliac joint  arthralgia on the Patrick maneuver.  And thigh thrust maneuver.  However the same maneuvers proved to be positive for bilateral hip joint arthralgia in the posterior aspect of the joint, with the right side being worse than the left.  This seems to be a new problem with him and therefore today we will be ordering diagnostic x-rays of both of his hips to see what is going on.  I will also tentatively schedule him to come back for a diagnostic intra-articular right hip injection under fluoroscopic guidance.  The plan was shared with the patient who understood and accepted.  RTCB: 01/03/2021 Nonopioids transferred 08/20/2020: Baclofen  Pharmacotherapy Assessment   Analgesic: Hydrocodone/APAP 5/325, 1 tab PO QD (5 mg/day of hydrocodone) MME/day: 5 mg/day.   Monitoring: [place] PMP: PDMP reviewed during this encounter.       Pharmacotherapy: No side-effects or adverse reactions reported. Compliance: No problems identified. Effectiveness: Clinically acceptable.  Jose Specking, RN  11/26/2020  2:44 PM  Sign when Signing Visit Nursing Pain Medication Assessment:  Safety precautions to be maintained throughout the outpatient stay will include: orient to surroundings, keep bed in low position, maintain call bell within reach at all times, provide assistance with transfer out of bed and ambulation.  Medication Inspection Compliance: Pill count conducted under aseptic conditions, in front of the patient. Neither the pills nor the bottle was removed from the patient's sight at any time. Once count was completed pills were immediately returned to the patient in their original bottle.  Medication: Hydrocodone/APAP Pill/Patch Count: 3 of 30 pills remain Pill/Patch Appearance: Markings  consistent with prescribed medication Bottle Appearance: Standard pharmacy container. Clearly labeled. Filled Date: 1 / 04 / 2022 Last Medication intake:  Yesterday    UDS:  Summary  Date Value Ref Range Status  08/20/2020 Note  Final    Comment:    ==================================================================== [employer] Select 13 (MW) ==================================================================== Test                             Result       Flag       Units  Drug Present and Declared for Prescription Verification   Hydrocodone                    841          EXPECTED   ng/mg creat   Hydromorphone                  29           EXPECTED   ng/mg creat   Dihydrocodeine                 143          EXPECTED   ng/mg creat   Norhydrocodone                 668          EXPECTED   ng/mg creat    Sources of hydrocodone include scheduled prescription medications.    Hydromorphone, dihydrocodeine and norhydrocodone are expected    metabolites of hydrocodone. Hydromorphone and dihydrocodeine are    also available as scheduled prescription medications.  Drug Present not Declared for Prescription Verification   Carboxy-THC                    58           UNEXPECTED ng/mg  creat    Carboxy-THC is a metabolite of tetrahydrocannabinol (THC). Source of    THC is most commonly herbal marijuana or marijuana-based products,    but THC is also present in a scheduled prescription medication.    Trace amounts of THC can be present in hemp and cannabidiol (CBD)    products. This test is not intended to distinguish between delta-9-    tetrahydrocannabinol, the predominant form of THC in most herbal or    marijuana-based products, and delta-8-tetrahydrocannabinol.  ==================================================================== Test                      Result    Flag   Units      Ref Range   Creatinine              173              mg/dL       >=20 ==================================================================== Declared Medications:  The flagging and interpretation on this report are based on the  following declared medications.  Unexpected results may arise from  inaccuracies in the declared medications.   **Note: The testing scope of this panel includes these medications:   Hydrocodone (Norco)   **Note: The testing scope of this panel does not include the  following reported medications:   Acetaminophen (Tylenol)  Acetaminophen (Norco)  Albuterol (Ventolin HFA)  Atenolol (Tenormin)  Atorvastatin (Lipitor)  Baclofen (Lioresal)  Buspirone (Buspar)  Calcium  Cannabidiol (Epidiolex)  Cyanocobalamin  Desloratadine (Clarinex-D)  Diphenhydramine  Duloxetine (Cymbalta)  Fenofibrate  Fluticasone (Flonase)  Hydroxyzine (Atarax)  Ibuprofen (Advil)  Magnesium (Mag-Ox)  Melatonin  Methylsulfonylmethane  Multivitamin  Omeprazole (Prilosec)  Ondansetron (Zofran)  Potassium  Pseudoephedrine (Clarinex-D)  Simethicone  Sucralfate (Carafate)  Supplement  Vitamin D3  Vitamin E ==================================================================== For clinical consultation, please call [phone]. ====================================================================      ROS  Constitutional: Denies any fever or chills Gastrointestinal: No reported hemesis, hematochezia, vomiting, or acute GI distress Musculoskeletal: Denies any acute onset joint swelling, redness, loss of ROM, or weakness Neurological: No reported episodes of acute onset apraxia, aphasia, dysarthria, agnosia, amnesia, paralysis, loss of coordination, or loss of consciousness  Medication Review  Calcium Carbonate, DULoxetine, Elderberry, Glucosamine-Chondroit-Vit C-Mn, HYDROcodone-acetaminophen, Methylsulfonylmethane, Potassium, Vitamin D3, acetaminophen, albuterol, atenolol, atorvastatin, baclofen, busPIRone, desloratadine-pseudoephedrine,  diphenhydrAMINE HCl, fenofibrate, fluticasone, hydrOXYzine, ibuprofen, magnesium oxide, melatonin, multivitamin, omeprazole, ondansetron, simethicone, sucralfate, vitamin B-12, and vitamin E  History Review  Allergy: Mr. Willmann has No Known Allergies. Drug: Mr. Carstarphen  reports no history of drug use. Alcohol:  reports no history of alcohol use. Tobacco:  reports that he has never smoked. He has never used smokeless tobacco. Social: Mr. Jarriel  reports that he has never smoked. He has never used smokeless tobacco. He reports that he does not drink alcohol and does not use drugs. Medical:  has a past medical history of Allergy, Anxiety, Chronic duodenal ulcer with hemorrhage (2012), Chronic neck pain, Depression, Hyperlipidemia, Hypertension, and Microscopic hematuria. Surgical: Mr. Sidman  has a past surgical history that includes eye muscle repair [phone] and 1975) and bLEEDING ULCER (2012). Family: family history includes Allergies in his brother; Cancer in his maternal grandfather and mother; Dementia in his father and paternal grandfather; Depression in his father; Diabetes in his mother; Mental illness in his father; Stroke in his paternal grandfather.  Laboratory Chemistry Profile   Renal Lab Results  Component Value Date   BUN 12 06/20/2020   CREATININE 0.97 06/20/2020   BCR  12 06/20/2020   GFRAA 102 06/20/2020   GFRNONAA 88 06/20/2020     Hepatic Lab Results  Component Value Date   AST 32 06/20/2020   ALT 34 06/20/2020   ALBUMIN 4.5 06/20/2020   ALKPHOS 63 06/20/2020   LIPASE 29 08/14/2019     Electrolytes Lab Results  Component Value Date   NA 138 06/20/2020   K 4.4 06/20/2020   CL 102 06/20/2020   CALCIUM 9.5 06/20/2020   MG 2.0 11/12/2016     Bone Lab Results  Component Value Date   VD25OH 40.5 12/21/2019   25OHVITD1 23 (L) 11/12/2016   25OHVITD2 <1.0 11/12/2016   25OHVITD3 23 11/12/2016     Inflammation (CRP: Acute Phase) (ESR: Chronic Phase) Lab  Results  Component Value Date   CRP 1.4 (H) 11/12/2016   ESRSEDRATE 3 11/12/2016       Note: Above Lab results reviewed.  Recent Imaging Review  DG PAIN CLINIC C-ARM 1-60 MIN NO REPORT Fluoro was used, but no Radiologist interpretation will be provided.  Please refer to "NOTES" tab for provider progress note. Note: Reviewed        Physical Exam  General appearance: Well nourished, well developed, and well hydrated. In no apparent acute distress Mental status: Alert, oriented x 3 (person, place, & time)       Respiratory: No evidence of acute respiratory distress Eyes: PERLA Vitals: BP 113/79    Pulse 79    Temp (!) 97.4 F (36.3 C)    Resp 16    Ht '5\' 7"'  (1.702 m)    Wt 185 lb (83.9 kg)    SpO2 95%    BMI 28.98 kg/m  BMI: Estimated body mass index is 28.98 kg/m as calculated from the following:   Height as of this encounter: '5\' 7"'  (1.702 m).   Weight as of this encounter: 185 lb (83.9 kg). Ideal: Ideal body weight: 66.1 kg (145 lb 11.6 oz) Adjusted ideal body weight: 73.2 kg (161 lb 6.9 oz)  Assessment   Status Diagnosis  Controlled Controlled Controlled 1. Chronic pain syndrome   2. Chronic low back pain (Primary Area of Pain) (Bilateral) (L>R) w/o sciatica    3. Chronic lower extremity pain (Secondary area of Pain) (Left)   4. Chronic right shoulder pain   5. Cervicalgia (Bilateral) (L>R)   6. Cervical facet syndrome (Bilateral) (L>R)   7. Pharmacologic therapy   8. Uncomplicated opioid dependence ([place])   9. History of illicit drug use   10. History of marijuana use   11. Abnormal result on screening urine test (03/27/20 & 08/20/20)   12. Chronic hip pain (Bilateral)      Updated Problems: Problem  Chronic hip pain (Bilateral)  Chronic upper back pain (Fourth area of Pain) (Bilateral) (L>R)   Formatting of this note might be different from the original. Last Assessment & Plan:  Per pain clinic   Uncomplicated Opioid Dependence ([facility])  History of Illicit Drug  Use  History of Marijuana Use  Abnormal result on screening urine test (03/27/20 & 08/20/20)   Abnormal UDS (03/27/2020) & (08/20/20) (+) undisclosed carboxy-THC     Plan of Care  Problem-specific:  No problem-specific Assessment & Plan notes found for this encounter.  Mr. KAINEN STRUCKMAN has a current medication list which includes the following long-term medication(s): albuterol, atenolol, atorvastatin, baclofen, calcium carbonate, diphenhydramine hcl, duloxetine, fenofibrate, [START ON 12/04/2020] hydrocodone-acetaminophen, omeprazole, potassium, simethicone, and sucralfate.  Pharmacotherapy (Medications Ordered): Meds ordered this encounter  Medications   HYDROcodone-acetaminophen (NORCO/VICODIN) 5-325 MG tablet    Sig: Take 1 tablet by mouth daily as needed for severe pain. Must last 30 days.    Dispense:  30 tablet    Refill:  0    Chronic Pain: STOP Act (Not applicable) Fill 1 day early if closed on refill date. Avoid benzodiazepines within 8 hours of opioids   Orders:  Orders Placed This Encounter  Procedures   HIP INJECTION    Standing Status:   Future    Standing Expiration Date:   02/24/2021    Scheduling Instructions:     Side: Right-sided     Sedation: Patient's choice.     Timeframe: As soon as schedule allows   DG HIP UNILAT W OR W/O PELVIS 2-3 VIEWS RIGHT    Please describe any evidence of DJD, such as joint narrowing, asymmetry, cysts, or any anomalies in bone density, production, or erosion.    Standing Status:   Future    Standing Expiration Date:   12/27/2020    Scheduling Instructions:     Imaging must be done as soon as possible. Inform patient that order will expire within 30 days and I will not renew it.    Order Specific Question:   Reason for Exam (SYMPTOM  OR DIAGNOSIS REQUIRED)    Answer:   Right hip pain/arthralgia    Order Specific Question:   Preferred imaging location?    Answer:   [facility]    Order Specific Question:   Call Results-  Best Contact Number?    Answer:   [phone] ([facility])    Order Specific Question:   Release to patient    Answer:   Immediate   DG HIP UNILAT W OR W/O PELVIS 2-3 VIEWS LEFT    Please describe any evidence of DJD, such as joint narrowing, asymmetry, cysts, or any anomalies in bone density, production, or erosion.    Standing Status:   Future    Standing Expiration Date:   12/27/2020    Scheduling Instructions:     Imaging must be done as soon as possible. Inform patient that order will expire within 30 days and I will not renew it.    Order Specific Question:   Reason for Exam (SYMPTOM  OR DIAGNOSIS REQUIRED)    Answer:   Right hip pain/arthralgia    Order Specific Question:   Preferred imaging location?    Answer:   [facility]    Order Specific Question:   Call Results- Best Contact Number?    Answer:   [phone] ([facility])   Tox[employer]SU[employer] Select 13 (MW), Urine    Volume: 30 ml(s). Minimum 3 ml of urine is needed. Document temperature of fresh sample. Indications: Long term (current) use of opiate analgesic (G62.694)    Order Specific Question:   Release to patient    Answer:   Immediate   Follow-up plan:   Return in about 5 weeks (around 01/03/2021) for (F2F), (Med Mgmt), in addition, Procedure (no sedation): (R) IA Hip inj..      Interventional treatment options:  Under consideration: Possible left lumbar facet RFA  Diagnostic left cervical facetblock  Possible left cervical facet RFA  Diagnostic bilateral sacroiliac joint block Possible bilateral sacroiliac joint RFA Diagnostic trigger point injections   Therapeutic/palliative (PRN): Palliative right lumbar facet RFA #2 (last done 11/22/2019) Palliative right lumbar facetblock #3  Diagnostic/therapeutic left lumbar facetblock #2  Diagnostic/therapeutic left L4-5LESI#3 Palliative left CESI#4 Diagnostic right-sided  cervical facet block #2 (last done 03/13/2020)  (100/100/100) Diagnostic left-sided cervical facet block #2 (last done 01/10/2020) (100/100/100)    Recent Visits No visits were found meeting these conditions. Showing recent visits within past 90 days and meeting all other requirements Today's Visits Date Type Provider Dept  11/26/20 Office Visit Milinda Pointer, MD [facility]  Showing today's visits and meeting all other requirements Future Appointments Date Type Provider Dept  12/30/20 Appointment Milinda Pointer, MD [facility]  Showing future appointments within next 90 days and meeting all other requirements  I discussed the assessment and treatment plan with the patient. The patient was provided an opportunity to ask questions and all were answered. The patient agreed with the plan and demonstrated an understanding of the instructions.  Patient advised to call back or seek an in-person evaluation if the symptoms or condition worsens.  Duration of encounter: 39 minutes.  Note by: Gaspar Cola, MD Date: 11/26/2020; Time: 3:10 PM

## 2020-11-26 ENCOUNTER — Ambulatory Visit: Payer: 59 | Attending: Pain Medicine | Admitting: Pain Medicine

## 2020-11-26 ENCOUNTER — Encounter: Payer: Self-pay | Admitting: Pain Medicine

## 2020-11-26 ENCOUNTER — Other Ambulatory Visit: Payer: Self-pay

## 2020-11-26 VITALS — BP 113/79 | HR 79 | Temp 97.4°F | Resp 16 | Ht 67.0 in | Wt 185.0 lb

## 2020-11-26 DIAGNOSIS — M542 Cervicalgia: Secondary | ICD-10-CM | POA: Diagnosis not present

## 2020-11-26 DIAGNOSIS — R8290 Unspecified abnormal findings in urine: Secondary | ICD-10-CM | POA: Diagnosis not present

## 2020-11-26 DIAGNOSIS — M79605 Pain in left leg: Secondary | ICD-10-CM | POA: Diagnosis not present

## 2020-11-26 DIAGNOSIS — M47812 Spondylosis without myelopathy or radiculopathy, cervical region: Secondary | ICD-10-CM

## 2020-11-26 DIAGNOSIS — M25511 Pain in right shoulder: Secondary | ICD-10-CM | POA: Diagnosis not present

## 2020-11-26 DIAGNOSIS — Z87898 Personal history of other specified conditions: Secondary | ICD-10-CM

## 2020-11-26 DIAGNOSIS — Z79899 Other long term (current) drug therapy: Secondary | ICD-10-CM | POA: Diagnosis not present

## 2020-11-26 DIAGNOSIS — F1991 Other psychoactive substance use, unspecified, in remission: Secondary | ICD-10-CM

## 2020-11-26 DIAGNOSIS — R69 Illness, unspecified: Secondary | ICD-10-CM | POA: Diagnosis not present

## 2020-11-26 DIAGNOSIS — F1120 Opioid dependence, uncomplicated: Secondary | ICD-10-CM | POA: Diagnosis not present

## 2020-11-26 DIAGNOSIS — M545 Low back pain, unspecified: Secondary | ICD-10-CM | POA: Diagnosis not present

## 2020-11-26 DIAGNOSIS — M25551 Pain in right hip: Secondary | ICD-10-CM

## 2020-11-26 DIAGNOSIS — G894 Chronic pain syndrome: Secondary | ICD-10-CM

## 2020-11-26 DIAGNOSIS — F1291 Cannabis use, unspecified, in remission: Secondary | ICD-10-CM | POA: Insufficient documentation

## 2020-11-26 DIAGNOSIS — M25552 Pain in left hip: Secondary | ICD-10-CM

## 2020-11-26 DIAGNOSIS — G8929 Other chronic pain: Secondary | ICD-10-CM | POA: Insufficient documentation

## 2020-11-26 MED ORDER — HYDROCODONE-ACETAMINOPHEN 5-325 MG PO TABS: 1.0000 | ORAL_TABLET | Freq: Every day | ORAL | 0 refills | Status: DC | PRN

## 2020-11-26 NOTE — Progress Notes (Signed)
Nursing Pain Medication Assessment:  Safety precautions to be maintained throughout the outpatient stay will include: orient to surroundings, keep bed in low position, maintain call bell within reach at all times, provide assistance with transfer out of bed and ambulation.  Medication Inspection Compliance: Pill count conducted under aseptic conditions, in front of the patient. Neither the pills nor the bottle was removed from the patient's sight at any time. Once count was completed pills were immediately returned to the patient in their original bottle.  Medication: Hydrocodone/APAP Pill/Patch Count: 3 of 30 pills remain Pill/Patch Appearance: Markings consistent with prescribed medication Bottle Appearance: Standard pharmacy container. Clearly labeled. Filled Date: 1 / 04 / 2022 Last Medication intake:  Yesterday

## 2020-11-26 NOTE — Patient Instructions (Addendum)
____________________________________________________________________________________________  CBD (cannabidiol) WARNING  Applicable to: All individuals currently taking or considering taking CBD (cannabidiol) and, more important, all patients taking opioid analgesic controlled substances (pain medication). (Example: oxycodone; oxymorphone; hydrocodone; hydromorphone; morphine; methadone; tramadol; tapentadol; fentanyl; buprenorphine; butorphanol; dextromethorphan; meperidine; codeine; etc.)  Legal status: CBD remains a Schedule I drug prohibited for any use. CBD is illegal with one exception. In the [place], CBD has a limited Food and Drug Administration (FDA) approval for the treatment of two specific types of epilepsy disorders. Only one CBD product has been approved by the FDA for this purpose: "Epidiolex". FDA is aware that some companies are marketing products containing cannabis and cannabis-derived compounds in ways that violate the Federal Food, Drug and Cosmetic Act (FD&C Act) and that may put the health and safety of consumers at risk. The FDA, a Federal agency, has not enforced the CBD status since 2018.   Legality: Some manufacturers ship CBD products nationally, which is illegal. Often such products are sold online and are therefore available throughout the country. CBD is openly sold in head shops and health food stores in some states where such sales have not been explicitly legalized. Selling unapproved products with unsubstantiated therapeutic claims is not only a violation of the law, but also can put patients at risk, as these products have not been proven to be safe or effective. Federal illegality makes it difficult to conduct research on CBD.  Reference: "FDA Regulation of Cannabis and Cannabis-Derived Products, Including Cannabidiol (CBD)" -  https://www.fda.gov/news-events/public-health-focus/fda-regulation-cannabis-and-cannabis-derived-products-including-cannabidiol-cbd  Warning: CBD is not FDA approved and has not undergo the same manufacturing controls as prescription drugs.  This means that the purity and safety of available CBD may be questionable. Most of the time, despite manufacturer's claims, it is contaminated with THC (delta-9-tetrahydrocannabinol - the chemical in marijuana responsible for the "HIGH").  When this is the case, the THC contaminant will trigger a positive urine drug screen (UDS) test for Marijuana (carboxy-THC). Because a positive UDS for any illicit substance is a violation of our medication agreement, your opioid analgesics (pain medicine) may be permanently discontinued.  MORE ABOUT CBD  General Information: CBD  is a derivative of the Marijuana (cannabis sativa) plant discovered in 1940. It is one of the 113 identified substances found in Marijuana. It accounts for up to 40% of the plant's extract. As of 2018, preliminary clinical studies on CBD included research for the treatment of anxiety, movement disorders, and pain. CBD is available and consumed in multiple forms, including inhalation of smoke or vapor, as an aerosol spray, and by mouth. It may be supplied as an oil containing CBD, capsules, dried cannabis, or as a liquid solution. CBD is thought not to be as psychoactive as THC (delta-9-tetrahydrocannabinol - the chemical in marijuana responsible for the "HIGH"). Studies suggest that CBD may interact with different biological target receptors in the body, including cannabinoid and other neurotransmitter receptors. As of 2018 the mechanism of action for its biological effects has not been determined.  Side-effects  Adverse reactions: Dry mouth, diarrhea, decreased appetite, fatigue, drowsiness, malaise, weakness, sleep disturbances, and others.  Drug interactions: CBC may interact with other medications  such as blood-thinners. (Last update: 06/07/2020) ____________________________________________________________________________________________   ____________________________________________________________________________________________  Medication Rules  Purpose: To inform patients, and their family members, of our rules and regulations.  Applies to: All patients receiving prescriptions (written or electronic).  Pharmacy of record: Pharmacy where electronic prescriptions will be sent. If written prescriptions are taken to a different pharmacy, please inform   the nursing staff. The pharmacy listed in the electronic medical record should be the one where you would like electronic prescriptions to be sent.  Electronic prescriptions: In compliance with the [place] (STOP) Act of 2017 (Session Lanny Cramp [id number]), effective November 01, 2018, all controlled substances must be electronically prescribed. Calling prescriptions to the pharmacy will cease to exist.  Prescription refills: Only during scheduled appointments. Applies to all prescriptions.  NOTE: The following applies primarily to controlled substances (Opioid* Pain Medications).   Type of encounter (visit): For patients receiving controlled substances, face-to-face visits are required. (Not an option or up to the patient.)  Patient's responsibilities: 1. Pain Pills: Bring all pain pills to every appointment (except for procedure appointments). 2. Pill Bottles: Bring pills in original pharmacy bottle. Always bring the newest bottle. Bring bottle, even if empty. 3. Medication refills: You are responsible for knowing and keeping track of what medications you take and those you need refilled. The day before your appointment: write a list of all prescriptions that need to be refilled. The day of the appointment: give the list to the admitting nurse. Prescriptions will be written only during  appointments. No prescriptions will be written on procedure days. If you forget a medication: it will not be "Called in", "Faxed", or "electronically sent". You will need to get another appointment to get these prescribed. No early refills. Do not call asking to have your prescription filled early. 4. Prescription Accuracy: You are responsible for carefully inspecting your prescriptions before leaving our office. Have the discharge nurse carefully go over each prescription with you, before taking them home. Make sure that your name is accurately spelled, that your address is correct. Check the name and dose of your medication to make sure it is accurate. Check the number of pills, and the written instructions to make sure they are clear and accurate. Make sure that you are given enough medication to last until your next medication refill appointment. 5. Taking Medication: Take medication as prescribed. When it comes to controlled substances, taking less pills or less frequently than prescribed is permitted and encouraged. Never take more pills than instructed. Never take medication more frequently than prescribed.  6. Inform other Doctors: Always inform, all of your healthcare providers, of all the medications you take. 7. Pain Medication from other Providers: You are not allowed to accept any additional pain medication from any other Doctor or Healthcare provider. There are two exceptions to this rule. (see below) In the event that you require additional pain medication, you are responsible for notifying us, as stated below. 8. Cough Medicine: Often these contain an opioid, such as codeine or hydrocodone. Never accept or take cough medicine containing these opioids if you are already taking an opioid* medication. The combination may cause respiratory failure and death. 9. Medication Agreement: You are responsible for carefully reading and following our Medication Agreement. This must be signed before  receiving any prescriptions from our practice. Safely store a copy of your signed Agreement. Violations to the Agreement will result in no further prescriptions. (Additional copies of our Medication Agreement are available upon request.) 10. Laws, Rules, & Regulations: All patients are expected to follow all Federal and [employer], [employer], Rules, [employer] [facility]. Ignorance of the Laws does not constitute a valid excuse.  11. Illegal drugs and Controlled Substances: The use of illegal substances (including, but not limited to marijuana and its derivatives) and/or the illegal use of any controlled substances is strictly prohibited.  Violation of this rule may result in the immediate and permanent discontinuation of any and all prescriptions being written by our practice. The use of any illegal substances is prohibited. 12. Adopted CDC guidelines & recommendations: Target dosing levels will be at or below 60 MME/day. Use of benzodiazepines** is not recommended.  Exceptions: There are only two exceptions to the rule of not receiving pain medications from other Healthcare Providers. 1. Exception #1 (Emergencies): In the event of an emergency (i.e.: accident requiring emergency care), you are allowed to receive additional pain medication. However, you are responsible for: As soon as you are able, call our office [phone], at any time of the day or night, and leave a message stating your name, the date and nature of the emergency, and the name and dose of the medication prescribed. In the event that your call is answered by a member of our staff, make sure to document and save the date, time, and the name of the person that took your information.  2. Exception #2 (Planned Surgery): In the event that you are scheduled by another doctor or dentist to have any type of surgery or procedure, you are allowed (for a period no longer than 30 days), to receive additional pain medication, for the acute post-op pain.  However, in this case, you are responsible for picking up a copy of our "Post-op Pain Management for Surgeons" handout, and giving it to your surgeon or dentist. This document is available at our office, and does not require an appointment to obtain it. Simply go to our office during business hours (Monday-Thursday from 8:00 AM to 4:00 PM) (Friday 8:00 AM to 12:00 Noon) or if you have a scheduled appointment with [place], prior to your surgery, and ask for it by name. In addition, you are responsible for: calling our office [phone], at any time of the day or night, and leaving a message stating your name, name of your surgeon, type of surgery, and date of procedure or surgery. Failure to comply with your responsibilities may result in termination of therapy involving the controlled substances.  *Opioid medications include: morphine, codeine, oxycodone, oxymorphone, hydrocodone, hydromorphone, meperidine, tramadol, tapentadol, buprenorphine, fentanyl, methadone. **Benzodiazepine medications include: diazepam (Valium), alprazolam (Xanax), clonazepam (Klonopine), lorazepam (Ativan), clorazepate (Tranxene), chlordiazepoxide (Librium), estazolam (Prosom), oxazepam (Serax), temazepam (Restoril), triazolam (Halcion) (Last updated: 09/29/2020) ____________________________________________________________________________________________   ____________________________________________________________________________________________  Medication Recommendations and Reminders  Applies to: All patients receiving prescriptions (written and/or electronic).  Medication Rules & Regulations: These rules and regulations exist for your safety and that of others. They are not flexible and neither are we. Dismissing or ignoring them will be considered "non-compliance" with medication therapy, resulting in complete and irreversible termination of such therapy. (See document titled "Medication Rules" for more details.) In all  conscience, because of safety reasons, we cannot continue providing a therapy where the patient does not follow instructions.  Pharmacy of record:   Definition: This is the pharmacy where your electronic prescriptions will be sent.   We do not endorse any particular pharmacy, however, we have experienced problems with [facility] not securing enough medication supply for the community.  We do not restrict you in your choice of pharmacy. However, once we write for your prescriptions, we will NOT be re-sending more prescriptions to fix restricted supply problems created by your pharmacy, or your insurance.   The pharmacy listed in the electronic medical record should be the one where you want electronic prescriptions to be sent.  If you  choose to change pharmacy, simply notify our nursing staff.  Recommendations:  Keep all of your pain medications in a safe place, under lock and key, even if you live alone. We will NOT replace lost, stolen, or damaged medication.  After you fill your prescription, take 1 week's worth of pills and put them away in a safe place. You should keep a separate, properly labeled bottle for this purpose. The remainder should be kept in the original bottle. Use this as your primary supply, until it runs out. Once it's gone, then you know that you have 1 week's worth of medicine, and it is time to come in for a prescription refill. If you do this correctly, it is unlikely that you will ever run out of medicine.  To make sure that the above recommendation works, it is very important that you make sure your medication refill appointments are scheduled at least 1 week before you run out of medicine. To do this in an effective manner, make sure that you do not leave the office without scheduling your next medication management appointment. Always ask the nursing staff to show you in your prescription , when your medication will be running out. Then arrange for the receptionist to  get you a return appointment, at least 7 days before you run out of medicine. Do not wait until you have 1 or 2 pills left, to come in. This is very poor planning and does not take into consideration that we may need to cancel appointments due to bad weather, sickness, or emergencies affecting our staff.  DO NOT ACCEPT A "Partial Fill": If for any reason your pharmacy does not have enough pills/tablets to completely fill or refill your prescription, do not allow for a "partial fill". The law allows the pharmacy to complete that prescription within 72 hours, without requiring a new prescription. If they do not fill the rest of your prescription within those 72 hours, you will need a separate prescription to fill the remaining amount, which we will NOT provide. If the reason for the partial fill is your insurance, you will need to talk to the pharmacist about payment alternatives for the remaining tablets, but again, DO NOT ACCEPT A PARTIAL FILL, unless you can trust your pharmacist to obtain the remainder of the pills within 72 hours.  Prescription refills and/or changes in medication(s):   Prescription refills, and/or changes in dose or medication, will be conducted only during scheduled medication management appointments. (Applies to both, written and electronic prescriptions.)  No refills on procedure days. No medication will be changed or started on procedure days. No changes, adjustments, and/or refills will be conducted on a procedure day. Doing so will interfere with the diagnostic portion of the procedure.  No phone refills. No medications will be "called into the pharmacy".  No Fax refills.  No weekend refills.  No Holliday refills.  No after hours refills.  Remember:  Business hours are:  Monday to Thursday 8:00 AM to 4:00 PM Provider's Schedule: Milinda Pointer, MD - Appointments are:  Medication management: Monday and Wednesday 8:00 AM to 4:00 PM Procedure day: Tuesday and  Thursday 7:30 AM to 4:00 PM Gillis Santa, MD - Appointments are:  Medication management: Tuesday and Thursday 8:00 AM to 4:00 PM Procedure day: Monday and Wednesday 7:30 AM to 4:00 PM (Last update: 05/21/2020) ____________________________________________________________________________________________   ____________________________________________________________________________________________  Drug Holidays (Slow)  What is a "Drug Holiday"? Drug Holiday: is the name given to the period of time during  which a patient stops taking a medication(s) for the purpose of eliminating tolerance to the drug.  Benefits . Improved effectiveness of opioids. . Decreased opioid dose needed to achieve benefits. . Improved pain with lesser dose.  What is tolerance? Tolerance: is the progressive decreased in effectiveness of a drug due to its repetitive use. With repetitive use, the body gets use to the medication and as a consequence, it loses its effectiveness. This is a common problem seen with opioid pain medications. As a result, a larger dose of the drug is needed to achieve the same effect that used to be obtained with a smaller dose.  How long should a "Drug Holiday" last? You should stay off of the pain medicine for at least 14 consecutive days. (2 weeks)  Should I stop the medicine "cold [place]"? No. You should always coordinate with your Pain Specialist so that he/she can provide you with the correct medication dose to make the transition as smoothly as possible.  How do I stop the medicine? Slowly. You will be instructed to decrease the daily amount of pills that you take by one (1) pill every seven (7) days. This is called a "slow downward taper" of your dose. For example: if you normally take four (4) pills per day, you will be asked to drop this dose to three (3) pills per day for seven (7) days, then to two (2) pills per day for seven (7) days, then to one (1) per day for seven (7) days,  and at the end of those last seven (7) days, this is when the "Drug Holiday" would start.   Will I have withdrawals? By doing a "slow downward taper" like this one, it is unlikely that you will experience any significant withdrawal symptoms. Typically, what triggers withdrawals is the sudden stop of a high dose opioid therapy. Withdrawals can usually be avoided by slowly decreasing the dose over a prolonged period of time. If you do not follow these instructions and decide to stop your medication abruptly, withdrawals may be possible.  What are withdrawals? Withdrawals: refers to the wide range of symptoms that occur after stopping or dramatically reducing opiate drugs after heavy and prolonged use. Withdrawal symptoms do not occur to patients that use low dose opioids, or those who take the medication sporadically. Contrary to benzodiazepine (example: Valium, Xanax, etc.) or alcohol withdrawals ("Delirium Tremens"), opioid withdrawals are not lethal. Withdrawals are the physical manifestation of the body getting rid of the excess receptors.  Expected Symptoms Early symptoms of withdrawal may include: . Agitation . Anxiety . Muscle aches . Increased tearing . Insomnia . Runny nose . Sweating . Yawning  Late symptoms of withdrawal may include: . Abdominal cramping . Diarrhea . Dilated pupils . Goose bumps . Nausea . Vomiting  Will I experience withdrawals? Due to the slow nature of the taper, it is very unlikely that you will experience any.  What is a slow taper? Taper: refers to the gradual decrease in dose.  (Last update: 05/21/2020) ____________________________________________________________________________________________    ____________________________________________________________________________________________  Preparing for your procedure (without sedation)  Procedure appointments are limited to planned procedures: . No Prescription Refills. . No disability  issues will be discussed. . No medication changes will be discussed.  Instructions: . Oral Intake: Do not eat or drink anything for at least 6 hours prior to your procedure. (Exception: Blood Pressure Medication. See below.) . Transportation: Unless otherwise stated by your physician, you may drive yourself after the procedure. . Blood  Pressure Medicine: Do not forget to take your blood pressure medicine with a sip of water the morning of the procedure. If your Diastolic (lower reading)is above 100 mmHg, elective cases will be cancelled/rescheduled. . Blood thinners: These will need to be stopped for procedures. Notify our staff if you are taking any blood thinners. Depending on which one you take, there will be specific instructions on how and when to stop it. . Diabetics on insulin: Notify the staff so that you can be scheduled 1st case in the morning. If your diabetes requires high dose insulin, take only  of your normal insulin dose the morning of the procedure and notify the staff that you have done so. . Preventing infections: Shower with an antibacterial soap the morning of your procedure.  . Build-up your immune system: Take 1000 mg of Vitamin C with every meal (3 times a day) the day prior to your procedure. Marland Kitchen Antibiotics: Inform the staff if you have a condition or reason that requires you to take antibiotics before dental procedures. . Pregnancy: If you are pregnant, call and cancel the procedure. . Sickness: If you have a cold, fever, or any active infections, call and cancel the procedure. . Arrival: You must be in the facility at least 30 minutes prior to your scheduled procedure. . Children: Do not bring any children with you. . Dress appropriately: Bring dark clothing that you would not mind if they get stained. . Valuables: Do not bring any jewelry or valuables.  Reasons to call and reschedule or cancel your procedure: (Following these recommendations will minimize the risk of a  serious complication.) . Surgeries: Avoid having procedures within 2 weeks of any surgery. (Avoid for 2 weeks before or after any surgery). . Flu Shots: Avoid having procedures within 2 weeks of a flu shots or . (Avoid for 2 weeks before or after immunizations). . Barium: Avoid having a procedure within 7-10 days after having had a radiological study involving the use of radiological contrast. (Myelograms, Barium swallow or enema study). . Heart attacks: Avoid any elective procedures or surgeries for the initial 6 months after a "Myocardial Infarction" (Heart Attack). . Blood thinners: It is imperative that you stop these medications before procedures. Let us know if you if you take any blood thinner.  . Infection: Avoid procedures during or within two weeks of an infection (including chest colds or gastrointestinal problems). Symptoms associated with infections include: Localized redness, fever, chills, night sweats or profuse sweating, burning sensation when voiding, cough, congestion, stuffiness, runny nose, sore throat, diarrhea, nausea, vomiting, cold or Flu symptoms, recent or current infections. It is specially important if the infection is over the area that we intend to treat. Marland Kitchen Heart and lung problems: Symptoms that may suggest an active cardiopulmonary problem include: cough, chest pain, breathing difficulties or shortness of breath, dizziness, ankle swelling, uncontrolled high or unusually low blood pressure, and/or palpitations. If you are experiencing any of these symptoms, cancel your procedure and contact your primary care physician for an evaluation.  Remember:  Regular Business hours are:  Monday to Thursday 8:00 AM to 4:00 PM  Provider's Schedule: Milinda Pointer, MD:  Procedure days: Tuesday and Thursday 7:30 AM to 4:00 PM  Gillis Santa, MD:  Procedure days: Monday and Wednesday 7:30 AM to 4:00  PM ____________________________________________________________________________________________   ____________________________________________________________________________________________  Preparing for your procedure (without sedation)  Procedure appointments are limited to planned procedures: . No Prescription Refills. . No disability issues will be discussed. Marland Kitchen No  medication changes will be discussed.  Instructions: . Oral Intake: Do not eat or drink anything for at least 6 hours prior to your procedure. (Exception: Blood Pressure Medication. See below.) . Transportation: Unless otherwise stated by your physician, you may drive yourself after the procedure. . Blood Pressure Medicine: Do not forget to take your blood pressure medicine with a sip of water the morning of the procedure. If your Diastolic (lower reading)is above 100 mmHg, elective cases will be cancelled/rescheduled. . Blood thinners: These will need to be stopped for procedures. Notify our staff if you are taking any blood thinners. Depending on which one you take, there will be specific instructions on how and when to stop it. . Diabetics on insulin: Notify the staff so that you can be scheduled 1st case in the morning. If your diabetes requires high dose insulin, take only  of your normal insulin dose the morning of the procedure and notify the staff that you have done so. . Preventing infections: Shower with an antibacterial soap the morning of your procedure.  . Build-up your immune system: Take 1000 mg of Vitamin C with every meal (3 times a day) the day prior to your procedure. Marland Kitchen Antibiotics: Inform the staff if you have a condition or reason that requires you to take antibiotics before dental procedures. . Pregnancy: If you are pregnant, call and cancel the procedure. . Sickness: If you have a cold, fever, or any active infections, call and cancel the procedure. . Arrival: You must be in the facility at least 30  minutes prior to your scheduled procedure. . Children: Do not bring any children with you. . Dress appropriately: Bring dark clothing that you would not mind if they get stained. . Valuables: Do not bring any jewelry or valuables.  Reasons to call and reschedule or cancel your procedure: (Following these recommendations will minimize the risk of a serious complication.) . Surgeries: Avoid having procedures within 2 weeks of any surgery. (Avoid for 2 weeks before or after any surgery). . Flu Shots: Avoid having procedures within 2 weeks of a flu shots or . (Avoid for 2 weeks before or after immunizations). . Barium: Avoid having a procedure within 7-10 days after having had a radiological study involving the use of radiological contrast. (Myelograms, Barium swallow or enema study). . Heart attacks: Avoid any elective procedures or surgeries for the initial 6 months after a "Myocardial Infarction" (Heart Attack). . Blood thinners: It is imperative that you stop these medications before procedures. Let us know if you if you take any blood thinner.  . Infection: Avoid procedures during or within two weeks of an infection (including chest colds or gastrointestinal problems). Symptoms associated with infections include: Localized redness, fever, chills, night sweats or profuse sweating, burning sensation when voiding, cough, congestion, stuffiness, runny nose, sore throat, diarrhea, nausea, vomiting, cold or Flu symptoms, recent or current infections. It is specially important if the infection is over the area that we intend to treat. Marland Kitchen Heart and lung problems: Symptoms that may suggest an active cardiopulmonary problem include: cough, chest pain, breathing difficulties or shortness of breath, dizziness, ankle swelling, uncontrolled high or unusually low blood pressure, and/or palpitations. If you are experiencing any of these symptoms, cancel your procedure and contact your primary care physician for an  evaluation.  Remember:  Regular Business hours are:  Monday to Thursday 8:00 AM to 4:00 PM  Provider's Schedule: Milinda Pointer, MD:  Procedure days: Tuesday and Thursday 7:30 AM to 4:00  PM  Gillis Santa, MD:  Procedure days: Monday and Wednesday 7:30 AM to 4:00 PM ____________________________________________________________________________________________

## 2020-12-03 LAB — TOXASSURE SELECT 13 (MW), URINE

## 2020-12-05 ENCOUNTER — Ambulatory Visit
Admission: RE | Admit: 2020-12-05 | Discharge: 2020-12-05 | Disposition: A | Discharge status: Home / Self Care | Payer: 59 | Source: Ambulatory Visit | Attending: Pain Medicine | Admitting: Pain Medicine

## 2020-12-05 ENCOUNTER — Other Ambulatory Visit: Payer: Self-pay

## 2020-12-05 ENCOUNTER — Ambulatory Visit
Admission: RE | Admit: 2020-12-05 | Discharge: 2020-12-05 | Disposition: A | Discharge status: Home / Self Care | Payer: 59 | Attending: Pain Medicine | Admitting: Pain Medicine

## 2020-12-05 DIAGNOSIS — M25551 Pain in right hip: Secondary | ICD-10-CM | POA: Diagnosis not present

## 2020-12-05 DIAGNOSIS — M25552 Pain in left hip: Secondary | ICD-10-CM | POA: Diagnosis not present

## 2020-12-05 DIAGNOSIS — G8929 Other chronic pain: Secondary | ICD-10-CM | POA: Insufficient documentation

## 2020-12-07 ENCOUNTER — Other Ambulatory Visit: Payer: Self-pay | Admitting: Family Medicine

## 2020-12-08 NOTE — Progress Notes (Signed)
PROVIDER NOTE: Information contained herein reflects review and annotations entered in association with encounter. Interpretation of such information and data should be left to medically-trained personnel. Information provided to patient can be located elsewhere in the medical record under "Patient Instructions". Document created using STT-dictation technology, any transcriptional errors that may result from process are unintentional.    Patient: Jose Washington  Service Category: Procedure  Provider: Gaspar Cola, MD  DOB: June 05, 1966  DOS: 12/09/2020  Location: [facility]  MRN: [id number]  Setting: Ambulatory - outpatient  Referring Provider: Jon Billings, NP  Type: Established Patient  Specialty: Interventional Pain Management  PCP: Jon Billings, NP   Primary Reason for Visit: Interventional Pain Management Treatment. CC: Hip Pain  Procedure:          Anesthesia, Analgesia, Anxiolysis:  Type: Intra-Articular Hip Injection          Primary Purpose: Diagnostic Region: Posterolateral hip joint area. Level: Lower pelvic and hip joint level. Target Area: Superior aspect of the hip joint cavity, going thru the superior portion of the capsular ligament. Approach: Posterolateral approach. Laterality: Right  Type: Local Anesthesia Indication(s): Analgesia         Route: Infiltration ([place]/IM) IV Access: Declined Sedation: Declined  Local Anesthetic: Lidocaine 1-2%  Position: Lateral Decubitus with bad side up Prepped Area: Entire Posterolateral hip area. DuraPrep (Iodine Povacrylex [0.7% available iodine] and Isopropyl Alcohol, 74% w/w)   Indications: 1. Chronic hip pain (Right)   2. Enthesopathy of hip region (Right)   3. Osteoarthritis of hip (Right)    Pain Score: Pre-procedure: 4 /10 Post-procedure: 3 /10   Pre-op H&P Assessment:  Mr. Morimoto is a 55 y.o. (year old), male patient, seen today for interventional treatment. He  has a past surgical  history that includes eye muscle repair (1972 and 1975) and bLEEDING ULCER (2012). Mr. Vilar has a current medication list which includes the following prescription(s): acetaminophen, albuterol, atenolol, atorvastatin, baclofen, buspirone, calcium carbonate, vitamin d3, desloratadine-pseudoephedrine, diphenhydramine hcl, duloxetine, elderberry, fenofibrate, fluticasone, glucosamine-chondroit-vit c-mn, hydrocodone-acetaminophen, hydroxyzine, ibuprofen, magnesium oxide, melatonin, methylsulfonylmethane, multivitamin, omeprazole, ondansetron, potassium, simethicone, sucralfate, vitamin b-12, and vitamin e. His primarily concern today is the Hip Pain  Initial Vital Signs:  Pulse/HCG Rate: 63ECG Heart Rate: 74 Temp: 98.4 F (36.9 C) Resp: 16 BP: 133/73 SpO2: 100 %  BMI: Estimated body mass index is 28.98 kg/m as calculated from the following:   Height as of this encounter: 5\' 7"  (1.702 m).   Weight as of this encounter: 185 lb (83.9 kg).  Risk Assessment: Allergies: Reviewed. He has No Known Allergies.  Allergy Precautions: None required Coagulopathies: Reviewed. None identified.  Blood-thinner therapy: None at this time Active Infection(s): Reviewed. None identified. Mr. Ryback is afebrile  Site Confirmation: Mr. Erman was asked to confirm the procedure and laterality before marking the site Procedure checklist: Completed Consent: Before the procedure and under the influence of no sedative(s), amnesic(s), or anxiolytics, the patient was informed of the treatment options, risks and possible complications. To fulfill our ethical and legal obligations, as recommended by the American Medical Association's Code of Ethics, I have informed the patient of my clinical impression; the nature and purpose of the treatment or procedure; the risks, benefits, and possible complications of the intervention; the alternatives, including doing nothing; the risk(s) and benefit(s) of the alternative treatment(s)  or procedure(s); and the risk(s) and benefit(s) of doing nothing. The patient was provided information about the general risks and possible complications associated with the procedure.  These may include, but are not limited to: failure to achieve desired goals, infection, bleeding, organ or nerve damage, allergic reactions, paralysis, and death. In addition, the patient was informed of those risks and complications associated to the procedure, such as failure to decrease pain; infection; bleeding; organ or nerve damage with subsequent damage to sensory, motor, and/or autonomic systems, resulting in permanent pain, numbness, and/or weakness of one or several areas of the body; allergic reactions; (i.e.: anaphylactic reaction); and/or death. Furthermore, the patient was informed of those risks and complications associated with the medications. These include, but are not limited to: allergic reactions (i.e.: anaphylactic or anaphylactoid reaction(s)); adrenal axis suppression; blood sugar elevation that in diabetics may result in ketoacidosis or comma; water retention that in patients with history of congestive heart failure may result in shortness of breath, pulmonary edema, and decompensation with resultant heart failure; weight gain; swelling or edema; medication-induced neural toxicity; particulate matter embolism and blood vessel occlusion with resultant organ, and/or nervous system infarction; and/or aseptic necrosis of one or more joints. Finally, the patient was informed that Medicine is not an exact science; therefore, there is also the possibility of unforeseen or unpredictable risks and/or possible complications that may result in a catastrophic outcome. The patient indicated having understood very clearly. We have given the patient no guarantees and we have made no promises. Enough time was given to the patient to ask questions, all of which were answered to the patient's satisfaction. Mr. Meller has  indicated that he wanted to continue with the procedure. Attestation: I, the ordering provider, attest that I have discussed with the patient the benefits, risks, side-effects, alternatives, likelihood of achieving goals, and potential problems during recovery for the procedure that I have provided informed consent. Date  Time: 12/09/2020 11:32 AM  Pre-Procedure Preparation:  Monitoring: As per clinic protocol. Respiration, ETCO2, SpO2, BP, heart rate and rhythm monitor placed and checked for adequate function Safety Precautions: Patient was assessed for positional comfort and pressure points before starting the procedure. Time-out: I initiated and conducted the "Time-out" before starting the procedure, as per protocol. The patient was asked to participate by confirming the accuracy of the "Time Out" information. Verification of the correct person, site, and procedure were performed and confirmed by me, the nursing staff, and the patient. "Time-out" conducted as per Joint Commission's Universal Protocol (UP.01.01.01). Time: 1202  Description of Procedure:          Safety Precautions: Aspiration looking for blood return was conducted prior to all injections. At no point did we inject any substances, as a needle was being advanced. No attempts were made at seeking any paresthesias. Safe injection practices and needle disposal techniques used. Medications properly checked for expiration dates. SDV (single dose vial) medications used. Description of the Procedure: Protocol guidelines were followed. The patient was placed in position over the fluoroscopy table. The target area was identified and the area prepped in the usual manner. Skin & deeper tissues infiltrated with local anesthetic. Appropriate amount of time allowed to pass for local anesthetics to take effect. The procedure needles were then advanced to the target area. Proper needle placement secured. Negative aspiration confirmed. Solution injected  in intermittent fashion, asking for systemic symptoms every 0.5cc of injectate. The needles were then removed and the area cleansed, making sure to leave some of the prepping solution back to take advantage of its long term bactericidal properties. Vitals:   12/09/20 1128 12/09/20 1201 12/09/20 1209  BP: 133/73 122/67 127/81  Pulse: 63    Resp: 16 18 16   Temp: 98.4 F (36.9 C)    TempSrc: Temporal    SpO2: 100% 100% 100%  Weight: 185 lb (83.9 kg)    Height: 5\' 7"  (1.702 m)      Start Time: 1203 hrs. End Time: 1206 hrs. Materials:  Needle(s) Type: Spinal Needle Gauge: 22G Length: 5.0-in Medication(s): Please see orders for medications and dosing details.  Imaging Guidance (Non-Spinal):          Type of Imaging Technique: Fluoroscopy Guidance (Non-Spinal) Indication(s): Assistance in needle guidance and placement for procedures requiring needle placement in or near specific anatomical locations not easily accessible without such assistance. Exposure Time: Please see nurses notes. Contrast: Before injecting any contrast, we confirmed that the patient did not have an allergy to iodine, shellfish, or radiological contrast. Once satisfactory needle placement was completed at the desired level, radiological contrast was injected. Contrast injected under live fluoroscopy. No contrast complications. See chart for type and volume of contrast used. Fluoroscopic Guidance: I was personally present during the use of fluoroscopy. "Tunnel Vision Technique" used to obtain the best possible view of the target area. Parallax error corrected before commencing the procedure. "Direction-depth-direction" technique used to introduce the needle under continuous pulsed fluoroscopy. Once target was reached, antero-posterior, oblique, and lateral fluoroscopic projection used confirm needle placement in all planes. Images permanently stored in EMR. Interpretation: I personally interpreted the imaging intraoperatively.  Adequate needle placement confirmed in multiple planes. Appropriate spread of contrast into desired area was observed. No evidence of afferent or efferent intravascular uptake. Permanent images saved into the patient's record.  Antibiotic Prophylaxis:   Anti-infectives (From admission, onward)   None     Indication(s): None identified  Post-operative Assessment:  Post-procedure Vital Signs:  Pulse/HCG Rate: 6369 Temp: 98.4 F (36.9 C) Resp: 16 BP: 127/81 SpO2: 100 %  EBL: None  Complications: No immediate post-treatment complications observed by team, or reported by patient.  Note: The patient tolerated the entire procedure well. A repeat set of vitals were taken after the procedure and the patient was kept under observation following institutional policy, for this type of procedure. Post-procedural neurological assessment was performed, showing return to baseline, prior to discharge. The patient was provided with post-procedure discharge instructions, including a section on how to identify potential problems. Should any problems arise concerning this procedure, the patient was given instructions to immediately contact us, at any time, without hesitation. In any case, we plan to contact the patient by telephone for a follow-up status report regarding this interventional procedure.  Comments:  No additional relevant information.  Plan of Care  Orders:  Orders Placed This Encounter  Procedures  . HIP INJECTION    Scheduling Instructions:     Side: Right-sided     Sedation: No Sedation.     Timeframe: Today  . DG PAIN [facility] C-ARM 1-60 MIN NO REPORT    Intraoperative interpretation by procedural physician at [place].    Standing Status:   Standing    Number of Occurrences:   1    Order Specific Question:   Reason for exam:    Answer:   Assistance in needle guidance and placement for procedures requiring needle placement in or near specific anatomical locations not  easily accessible without such assistance.  . Informed Consent Details: Physician/Practitioner Attestation; Transcribe to consent form and obtain patient signature    Nursing Order: Transcribe to consent form and obtain patient signature. Note: Always confirm  laterality of pain with Mr. Piasecki, before procedure.    Order Specific Question:   Physician/Practitioner attestation of informed consent for procedure/surgical case    Answer:   I, the physician/practitioner, attest that I have discussed with the patient the benefits, risks, side effects, alternatives, likelihood of achieving goals and potential problems during recovery for the procedure that I have provided informed consent.    Order Specific Question:   Procedure    Answer:   Hip injection    Order Specific Question:   Physician/Practitioner performing the procedure    Answer:   Chundra Sauerwein A. Dossie Arbour, MD    Order Specific Question:   Indication/Reason    Answer:   Hip Joint Pain (Arthralgia)  . Provide equipment / supplies at bedside    "Block Tray" (Disposable  single use) Needle type: SpinalSpinal Amount/quantity: 1 Size: Long (7-inch) Gauge: 22G    Standing Status:   Standing    Number of Occurrences:   1    Order Specific Question:   Specify    Answer:   Block Tray   Chronic Opioid Analgesic:  Hydrocodone/APAP 5/325, 1 tab PO QD (5 mg/day of hydrocodone) MME/day: 5 mg/day.   Medications ordered for procedure: Meds ordered this encounter  Medications  . iohexol (OMNIPAQUE) 180 MG/ML injection 10 mL    Must be Myelogram-compatible. If not available, you may substitute with a water-soluble, non-ionic, hypoallergenic, myelogram-compatible radiological contrast medium.  Marland Kitchen lidocaine (XYLOCAINE) 2 % (with pres) injection 400 mg  . methylPREDNISolone acetate (DEPO-MEDROL) injection 80 mg  . ropivacaine (PF) 2 mg/mL (0.2%) (NAROPIN) injection 9 mL   Medications administered: We administered iohexol, lidocaine,  methylPREDNISolone acetate, and ropivacaine (PF) 2 mg/mL (0.2%).  See the medical record for exact dosing, route, and time of administration.  Follow-up plan:   Return in about 2 weeks (around 12/23/2020) for (F2F), (Med Mgmt).       Interventional treatment options:  Under consideration: Possible left lumbar facet RFA  Diagnostic left cervical facetblock  Possible left cervical facet RFA  Diagnostic bilateral sacroiliac joint block Possible bilateral sacroiliac joint RFA Diagnostic trigger point injections   Therapeutic/palliative (PRN): Palliative right lumbar facet RFA #2 (last done 11/22/2019) Palliative right lumbar facetblock #3  Diagnostic/therapeutic left lumbar facetblock #2  Diagnostic/therapeutic left [id number]#3 Palliative left CESI#4 Diagnostic right-sided cervical facet block #2 (last done 03/13/2020) (100/100/100) Diagnostic left-sided cervical facet block #2 (last done 01/10/2020) (100/100/100)     Recent Visits Date Type Provider Dept  11/26/20 Office Visit Milinda Pointer, MD [facility]  Showing recent visits within past 90 days and meeting all other requirements Today's Visits Date Type Provider Dept  12/09/20 Procedure visit Milinda Pointer, MD [facility]  Showing today's visits and meeting all other requirements Future Appointments Date Type Provider Dept  12/30/20 Appointment Milinda Pointer, MD [facility]  Showing future appointments within next 90 days and meeting all other requirements  Disposition: Discharge home  Discharge (Date  Time): 12/09/2020; 1220 hrs.   Primary Care Physician: Jon Billings, NP Location: [facility] Note by: Gaspar Cola, MD Date: 12/09/2020; Time: 12:15 PM  Disclaimer:  Medicine is not an Chief Strategy Officer. The only guarantee in medicine is that nothing is guaranteed. It is important to note that the decision to proceed with this  intervention was based on the information collected from the patient. The Data and conclusions were drawn from the patient's questionnaire, the interview, and the physical examination. Because the information was  provided in large part by the patient, it cannot be guaranteed that it has not been purposely or unconsciously manipulated. Every effort has been made to obtain as much relevant data as possible for this evaluation. It is important to note that the conclusions that lead to this procedure are derived in large part from the available data. Always take into account that the treatment will also be dependent on availability of resources and existing treatment guidelines, considered by other Pain Management Practitioners as being common knowledge and practice, at the time of the intervention. For Medico-Legal purposes, it is also important to point out that variation in procedural techniques and pharmacological choices are the acceptable norm. The indications, contraindications, technique, and results of the above procedure should only be interpreted and judged by a Board-Certified Interventional Pain Specialist with extensive familiarity and expertise in the same exact procedure and technique.

## 2020-12-09 ENCOUNTER — Ambulatory Visit (HOSPITAL_BASED_OUTPATIENT_CLINIC_OR_DEPARTMENT_OTHER): Payer: 59 | Admitting: Pain Medicine

## 2020-12-09 ENCOUNTER — Encounter: Payer: Self-pay | Admitting: Pain Medicine

## 2020-12-09 ENCOUNTER — Other Ambulatory Visit: Payer: Self-pay

## 2020-12-09 ENCOUNTER — Ambulatory Visit
Admission: RE | Admit: 2020-12-09 | Discharge: 2020-12-09 | Disposition: A | Discharge status: Home / Self Care | Payer: 59 | Source: Ambulatory Visit | Attending: Pain Medicine | Admitting: Pain Medicine

## 2020-12-09 VITALS — BP 127/81 | HR 63 | Temp 98.4°F | Resp 16 | Ht 67.0 in | Wt 185.0 lb

## 2020-12-09 DIAGNOSIS — M1611 Unilateral primary osteoarthritis, right hip: Secondary | ICD-10-CM | POA: Diagnosis not present

## 2020-12-09 DIAGNOSIS — M25551 Pain in right hip: Secondary | ICD-10-CM | POA: Insufficient documentation

## 2020-12-09 DIAGNOSIS — G8929 Other chronic pain: Secondary | ICD-10-CM | POA: Diagnosis not present

## 2020-12-09 DIAGNOSIS — M76891 Other specified enthesopathies of right lower limb, excluding foot: Secondary | ICD-10-CM | POA: Insufficient documentation

## 2020-12-09 MED ORDER — METHYLPREDNISOLONE ACETATE 80 MG/ML IJ SUSP
80.0000 mg | Freq: Once | INTRAMUSCULAR | Status: AC
  Administered 2020-12-09: 80 mg via INTRA_ARTICULAR
  Filled 2020-12-09: qty 1

## 2020-12-09 MED ORDER — IOHEXOL 180 MG/ML  SOLN
10.0000 mL | Freq: Once | INTRAMUSCULAR | Status: AC
  Administered 2020-12-09: 10 mL via INTRA_ARTICULAR

## 2020-12-09 MED ORDER — LIDOCAINE HCL 2 % IJ SOLN
20.0000 mL | Freq: Once | INTRAMUSCULAR | Status: AC
  Administered 2020-12-09: 200 mg
  Filled 2020-12-09: qty 20

## 2020-12-09 MED ORDER — ROPIVACAINE HCL 2 MG/ML IJ SOLN
9.0000 mL | Freq: Once | INTRAMUSCULAR | Status: AC
  Administered 2020-12-09: 9 mL via INTRA_ARTICULAR
  Filled 2020-12-09: qty 10

## 2020-12-09 NOTE — Patient Instructions (Addendum)
____________________________________________________________________________________________  Post-Procedure Discharge Instructions  Instructions:  Apply ice:   Purpose: This will minimize any swelling and discomfort after procedure.   When: Day of procedure, as soon as you get home.  How: Fill a plastic sandwich bag with crushed ice. Cover it with a small towel and apply to injection site.  How long: (15 min on, 15 min off) Apply for 15 minutes then remove x 15 minutes.  Repeat sequence on day of procedure, until you go to bed.  Apply heat:   Purpose: To treat any soreness and discomfort from the procedure.  When: Starting the next day after the procedure.  How: Apply heat to procedure site starting the day following the procedure.  How long: May continue to repeat daily, until discomfort goes away.  Food intake: Start with clear liquids (like water) and advance to regular food, as tolerated.   Physical activities: Keep activities to a minimum for the first 8 hours after the procedure. After that, then as tolerated.  Driving: If you have received any sedation, be responsible and do not drive. You are not allowed to drive for 24 hours after having sedation.  Blood thinner: (Applies only to those taking blood thinners) You may restart your blood thinner 6 hours after your procedure.  Insulin: (Applies only to Diabetic patients taking insulin) As soon as you can eat, you may resume your normal dosing schedule.  Infection prevention: Keep procedure site clean and dry. Shower daily and clean area with soap and water.  Post-procedure Pain Diary: Extremely important that this be done correctly and accurately. Recorded information will be used to determine the next step in treatment. For the purpose of accuracy, follow these rules:  Evaluate only the area treated. Do not report or include pain from an untreated area. For the purpose of this evaluation, ignore all other areas of pain,  except for the treated area.  After your procedure, avoid taking a long nap and attempting to complete the pain diary after you wake up. Instead, set your alarm clock to go off every hour, on the hour, for the initial 8 hours after the procedure. Document the duration of the numbing medicine, and the relief you are getting from it.  Do not go to sleep and attempt to complete it later. It will not be accurate. If you received sedation, it is likely that you were given a medication that may cause amnesia. Because of this, completing the diary at a later time may cause the information to be inaccurate. This information is needed to plan your care.  Follow-up appointment: Keep your post-procedure follow-up evaluation appointment after the procedure (usually 2 weeks for most procedures, 6 weeks for radiofrequencies). DO NOT FORGET to bring you pain diary with you.   Expect: (What should I expect to see with my procedure?)  From numbing medicine (AKA: Local Anesthetics): Numbness or decrease in pain. You may also experience some weakness, which if present, could last for the duration of the local anesthetic.  Onset: Full effect within 15 minutes of injected.  Duration: It will depend on the type of local anesthetic used. On the average, 1 to 8 hours.   From steroids (Applies only if steroids were used): Decrease in swelling or inflammation. Once inflammation is improved, relief of the pain will follow.  Onset of benefits: Depends on the amount of swelling present. The more swelling, the longer it will take for the benefits to be seen. In some cases, up to 10 days.    Duration: Steroids will stay in the system x 2 weeks. Duration of benefits will depend on multiple posibilities including persistent irritating factors.  Side-effects: If present, they may typically last 2 weeks (the duration of the steroids).  Frequent: Cramps (if they occur, drink Gatorade and take over-the-counter Magnesium 450-500 mg  once to twice a day); water retention with temporary weight gain; increases in blood sugar; decreased immune system response; increased appetite.  Occasional: Facial flushing (red, warm cheeks); mood swings; menstrual changes.  Uncommon: Long-term decrease or suppression of natural hormones; bone thinning. (These are more common with higher doses or more frequent use. This is why we prefer that our patients avoid having any injection therapies in other practices.)   Very Rare: Severe mood changes; psychosis; aseptic necrosis.  From procedure: Some discomfort is to be expected once the numbing medicine wears off. This should be minimal if ice and heat are applied as instructed.  Call if: (When should I call?)  You experience numbness and weakness that gets worse with time, as opposed to wearing off.  New onset bowel or bladder incontinence. (Applies only to procedures done in the spine)  Emergency Numbers:  Durning business hours (Monday - Thursday, 8:00 AM - 4:00 PM) (Friday, 9:00 AM - 12:00 Noon): [phone]  After hours: [phone]  NOTE: If you are having a problem and are unable connect with, or to talk to a provider, then go to your nearest urgent care or emergency department. If the problem is serious and urgent, please call 911. ____________________________________________________________________________________________   Pain Management Discharge Instructions  General Discharge Instructions :  If you need to reach your doctor call: Monday-Friday 8:00 am - 4:00 pm at [phone] or toll free [phone].  After clinic hours [phone] to have operator reach doctor.  Bring all of your medication bottles to all your appointments in the pain clinic.  To cancel or reschedule your appointment with Pain Management please remember to call 24 hours in advance to avoid a fee.  Refer to the educational materials which you have been given on: General Risks, I had my  Procedure. Discharge Instructions, Post Sedation.  Post Procedure Instructions:  The drugs you were given will stay in your system until tomorrow, so for the next 24 hours you should not drive, make any legal decisions or drink any alcoholic beverages.  You may eat anything you prefer, but it is better to start with liquids then soups and crackers, and gradually work up to solid foods.  Please notify your doctor immediately if you have any unusual bleeding, trouble breathing or pain that is not related to your normal pain.  Depending on the type of procedure that was done, some parts of your body may feel week and/or numb.  This usually clears up by tonight or the next day.  Walk with the use of an assistive device or accompanied by an adult for the 24 hours.  You may use ice on the affected area for the first 24 hours.  Put ice in a Ziploc bag and cover with a towel and place against area 15 minutes on 15 minutes off.  You may switch to heat after 24 hours. Trigger Point Injection Trigger points are areas where you have pain. A trigger point injection is a shot given in the trigger point to help relieve pain for a few days to a few months. Common places for trigger points include:  The neck.  The shoulders.  The upper back.  The  lower back. A trigger point injection will not cure long-term (chronic) pain permanently. These injections do not always work for every person. For some people, they can help to relieve pain for a few days to a few months. Tell a health care provider about:  Any allergies you have.  All medicines you are taking, including vitamins, herbs, eye drops, creams, and over-the-counter medicines.  Any problems you or family members have had with anesthetic medicines.  Any blood disorders you have.  Any surgeries you have had.  Any medical conditions you have. What are the risks? Generally, this is a safe procedure. However, problems may occur,  including:  Infection.  Bleeding or bruising.  Allergic reaction to the injected medicine.  Irritation of the skin around the injection site. What happens before the procedure? Ask your health care provider about:  Changing or stopping your regular medicines. This is especially important if you are taking diabetes medicines or blood thinners.  Taking medicines such as aspirin and ibuprofen. These medicines can thin your blood. Do not take these medicines unless your health care provider tells you to take them.  Taking over-the-counter medicines, vitamins, herbs, and supplements. What happens during the procedure?  Your health care provider will feel for trigger points. A marker may be used to circle the area for the injection.  The skin over the trigger point will be washed with a germ-killing (antiseptic) solution.  A thin needle is used for the injection. You may feel pain or a twitching feeling when the needle enters the trigger point.  A numbing solution may be injected into the trigger point. Sometimes a medicine to keep down inflammation is also injected.  Your health care provider may move the needle around the area where the trigger point is located until the tightness and twitching goes away.  After the injection, your health care provider may put gentle pressure over the injection site.  The injection site will be covered with a bandage (dressing). The procedure may vary among health care providers and hospitals.   What can I expect after treatment? After treatment, you may have:  Soreness and stiffness for 1-2 days.  A dressing. This can be taken off in a few hours or as told by your health care provider. Follow these instructions at home: Injection site care  Remove your dressing as told by your health care provider.  Check your injection site every day for signs of infection. Check for: ? Redness, swelling, or pain. ? Fluid or blood. ? Warmth. ? Pus or a  bad smell. Managing pain, stiffness, and swelling  If directed, put ice on the affected area. ? Put ice in a plastic bag. ? Place a towel between your skin and the bag. ? Leave the ice on for 20 minutes, 2-3 times a day. General instructions  If you were asked to stop your regular medicines, ask your health care provider when you may start taking them again.  Return to your normal activities as told by your health care provider. Ask your health care provider what activities are safe for you.  Do not take baths, swim, or use a hot tub until your health care provider approves.  You may be asked to see an occupational or physical therapist for exercises that reduce muscle strain and stretch the area of the trigger point.  Keep all follow-up visits as told by your health care provider. This is important. Contact a health care provider if:  Your pain comes back,  and it is worse than before the injection. You may need more injections.  You have chills or a fever.  The injection site becomes more painful, red, swollen, or warm to the touch. Summary  A trigger point injection is a shot given in the trigger point to help relieve pain for a few days to a few months.  Common places for trigger point injections are the neck, shoulder, upper back, and lower back.  These injections do not always work for every person, but for some people, the injections can help to relieve pain for a few days to a few months.  Contact a health care provider if symptoms come back or they are worse than before treatment. Also, get help if the injection site becomes more painful, red, swollen, or warm to the touch. This information is not intended to replace advice given to you by your health care provider. Make sure you discuss any questions you have with your health care provider. Document Revised: 11/29/2018 Document Reviewed: 11/29/2018 [employer] Patient Education  2021 [employer].

## 2020-12-10 ENCOUNTER — Telehealth: Payer: Self-pay | Admitting: *Deleted

## 2020-12-10 NOTE — Telephone Encounter (Signed)
Spoke with patient re; procedure on yesterday.  States he is doing very well and has no questions or concerns.

## 2020-12-14 ENCOUNTER — Other Ambulatory Visit: Payer: Self-pay | Admitting: Family Medicine

## 2020-12-20 ENCOUNTER — Encounter: Payer: Self-pay | Admitting: Nurse Practitioner

## 2020-12-20 DIAGNOSIS — K4090 Unilateral inguinal hernia, without obstruction or gangrene, not specified as recurrent: Secondary | ICD-10-CM | POA: Insufficient documentation

## 2020-12-24 ENCOUNTER — Ambulatory Visit (INDEPENDENT_AMBULATORY_CARE_PROVIDER_SITE_OTHER): Payer: 59 | Admitting: Nurse Practitioner

## 2020-12-24 ENCOUNTER — Other Ambulatory Visit: Payer: Self-pay

## 2020-12-24 ENCOUNTER — Encounter: Payer: Self-pay | Admitting: Nurse Practitioner

## 2020-12-24 VITALS — BP 113/74 | HR 54 | Temp 98.1°F | Ht 67.0 in | Wt 181.6 lb

## 2020-12-24 DIAGNOSIS — E559 Vitamin D deficiency, unspecified: Secondary | ICD-10-CM

## 2020-12-24 DIAGNOSIS — Z125 Encounter for screening for malignant neoplasm of prostate: Secondary | ICD-10-CM

## 2020-12-24 DIAGNOSIS — M7918 Myalgia, other site: Secondary | ICD-10-CM | POA: Diagnosis not present

## 2020-12-24 DIAGNOSIS — E782 Mixed hyperlipidemia: Secondary | ICD-10-CM | POA: Diagnosis not present

## 2020-12-24 DIAGNOSIS — R69 Illness, unspecified: Secondary | ICD-10-CM | POA: Diagnosis not present

## 2020-12-24 DIAGNOSIS — Z0000 Encounter for general adult medical examination without abnormal findings: Secondary | ICD-10-CM

## 2020-12-24 DIAGNOSIS — G8929 Other chronic pain: Secondary | ICD-10-CM

## 2020-12-24 DIAGNOSIS — M62838 Other muscle spasm: Secondary | ICD-10-CM | POA: Diagnosis not present

## 2020-12-24 DIAGNOSIS — I10 Essential (primary) hypertension: Secondary | ICD-10-CM | POA: Diagnosis not present

## 2020-12-24 DIAGNOSIS — G894 Chronic pain syndrome: Secondary | ICD-10-CM

## 2020-12-24 DIAGNOSIS — F419 Anxiety disorder, unspecified: Secondary | ICD-10-CM | POA: Diagnosis not present

## 2020-12-24 DIAGNOSIS — F325 Major depressive disorder, single episode, in full remission: Secondary | ICD-10-CM | POA: Diagnosis not present

## 2020-12-24 DIAGNOSIS — Z8711 Personal history of peptic ulcer disease: Secondary | ICD-10-CM

## 2020-12-24 DIAGNOSIS — K219 Gastro-esophageal reflux disease without esophagitis: Secondary | ICD-10-CM

## 2020-12-24 LAB — MICROALBUMIN, URINE WAIVED
Creatinine, Urine Waived: 300 mg/dL (ref 10–300)
Microalb, Ur Waived: 30 mg/L — ABNORMAL HIGH (ref 0–19)
Microalb/Creat Ratio: <30 mg/g (ref <30)

## 2020-12-24 MED ORDER — HYDROXYZINE HCL 25 MG PO TABS: 25.0000 mg | ORAL_TABLET | Freq: Three times a day (TID) | ORAL | 2 refills | Status: DC | PRN

## 2020-12-24 MED ORDER — LEVALBUTEROL TARTRATE 45 MCG/ACT IN AERO: 1.0000 | INHALATION_SPRAY | Freq: Four times a day (QID) | RESPIRATORY_TRACT | 12 refills | Status: DC | PRN

## 2020-12-24 MED ORDER — OMEPRAZOLE 20 MG PO CPDR: 20.0000 mg | DELAYED_RELEASE_CAPSULE | Freq: Every day | ORAL | 4 refills | Status: DC

## 2020-12-24 MED ORDER — BUSPIRONE HCL 30 MG PO TABS
30.0000 mg | ORAL_TABLET | Freq: Two times a day (BID) | ORAL | 4 refills | Status: DC
Start: 2020-12-24 — End: 2021-12-25

## 2020-12-24 MED ORDER — ATORVASTATIN CALCIUM 40 MG PO TABS: 40.0000 mg | ORAL_TABLET | Freq: Every day | ORAL | 4 refills | Status: DC

## 2020-12-24 MED ORDER — DULOXETINE HCL 30 MG PO CPEP
30.0000 mg | ORAL_CAPSULE | Freq: Two times a day (BID) | ORAL | 4 refills | Status: DC
Start: 2020-12-24 — End: 2021-12-25

## 2020-12-24 MED ORDER — FENOFIBRATE 160 MG PO TABS: 160.0000 mg | ORAL_TABLET | Freq: Every day | ORAL | 4 refills | Status: DC

## 2020-12-24 MED ORDER — ATENOLOL 50 MG PO TABS: 50.0000 mg | ORAL_TABLET | Freq: Every day | ORAL | 4 refills | Status: DC

## 2020-12-24 MED ORDER — ONDANSETRON HCL 8 MG PO TABS: 8.0000 mg | ORAL_TABLET | Freq: Two times a day (BID) | ORAL | 5 refills | Status: DC

## 2020-12-24 MED ORDER — SUCRALFATE 1 G PO TABS: 1.0000 g | ORAL_TABLET | Freq: Two times a day (BID) | ORAL | 4 refills | Status: DC

## 2020-12-24 MED ORDER — BACLOFEN 10 MG PO TABS: 10.0000 mg | ORAL_TABLET | Freq: Every day | ORAL | 4 refills | Status: DC

## 2020-12-24 NOTE — Assessment & Plan Note (Signed)
Chronic, ongoing.  Denies SI/HI.  Continue current medication regimen and adjust as needed.  Return in 6 months.

## 2020-12-24 NOTE — Progress Notes (Signed)
BP 113/74   Pulse (!) 54   Temp 98.1 F (36.7 C) (Oral)   Ht '5\' 7"'  (1.702 m)   Wt 181 lb 9.6 oz (82.4 kg)   SpO2 99%   BMI 28.44 kg/m    Subjective:    Patient ID: Jose Washington, male    DOB: [birth year], 55 y.o.   MRN: [id number]  HPI: Jose Washington is a 55 y.o. male presenting on 12/24/2020 for comprehensive medical examination. Current medical complaints include:none  He currently lives with: self Interim Problems from his last visit: no   HYPERTENSION / HYPERLIPIDEMIA Continues on Atenolol, Atorvastatin, Fenofibrate.  Satisfied with current treatment? yes Duration of hypertension: chronic BP monitoring frequency: not checking BP range:  BP medication side effects: no Duration of hyperlipidemia: chronic Cholesterol medication side effects: no Cholesterol supplements: none Medication compliance: good compliance Aspirin: no Recent stressors: no Recurrent headaches: no Visual changes: no Palpitations: no Dyspnea: no Chest pain: no Lower extremity edema: no Dizzy/lightheaded: no   GERD Has of bleeding stomach ulcers in 2012 -- continues on Prilosec and Carafate daily.  He reports he has been doing well until 2 months ago when he started noting some discomfort in abdomen -- would like to return to GI for assessment. GERD control status: stable  Satisfied with current treatment? yes Heartburn frequency: minimal Medication side effects: no  Medication compliance: stable Nature: aching and burning Location: umbilicus Heartburn duration: 30 minutes Alleviatiating factors:  Prilosec Aggravating factors: certain foods Dysphagia: no Odynophagia:  no Hematemesis: no Blood in stool: no EGD: yes  CHRONIC PAIN  Last saw pain clinic 12/09/20.  Continues on Norco as needed.  Received injection at recent visit. Present dose:  Morphine equivalents Pain control status: stable Duration: chronic Location:  back Quality: dull and aching Current Pain Level:  1/10 Previous Pain Level: 2/10 Breakthrough pain: no Benefit from narcotic medications: yes What Activities task can be accomplished with current medication? Interested in weaning off narcotics:no   Stool softners/OTC fiber: yes  Previous pain specialty evaluation: yes Non-narcotic analgesic meds: yes  ANXIETY/STRESS Continues on Buspar and Duloxetine + Vistaril.   Duration:stable Anxious mood: no  Excessive worrying: no Irritability: no  Sweating: no Nausea: no Palpitations:no Hyperventilation: no Panic attacks: no Agoraphobia: no  Obscessions/compulsions: no Depressed mood: no Depression screen [facility] 2/9 12/24/2020 12/09/2020 08/20/2020 06/20/2020 03/13/2020  Decreased Interest 0 0 0 0 0  Down, Depressed, Hopeless 0 0 0 0 0  PHQ - 2 Score 0 0 0 0 0  Altered sleeping 0 - - 0 -  Tired, decreased energy 0 - - 0 -  Change in appetite 0 - - 0 -  Feeling bad or failure about yourself  0 - - 0 -  Trouble concentrating 0 - - 0 -  Moving slowly or fidgety/restless 0 - - 0 -  Suicidal thoughts 0 - - 0 -  PHQ-9 Score 0 - - 0 -  Some recent data might be hidden   Anhedonia: no Weight changes: no Insomnia: none Hypersomnia: no Fatigue/loss of energy: no Feelings of worthlessness: no Feelings of guilt: no Impaired concentration/indecisiveness: no Suicidal ideations: no  Crying spells: no Recent Stressors/Life Changes: no   Relationship problems: no   Family stress: no     Financial stress: no    Job stress: no    Recent death/loss: no  Functional Status Survey: Is the patient deaf or have difficulty hearing?: No Does the patient have difficulty seeing, even when wearing  glasses/contacts?: No Does the patient have difficulty concentrating, remembering, or making decisions?: No Does the patient have difficulty walking or climbing stairs?: No Does the patient have difficulty dressing or bathing?: No Does the patient have difficulty doing errands alone such as visiting a  doctor's office or shopping?: No  FALL RISK: Fall Risk  12/09/2020 11/26/2020 08/20/2020 06/20/2020 05/14/2020  Falls in the past year? 0 0 0 0 0  Number falls in past yr: 0 0 - 0 -  Injury with Fall? 0 0 - 0 -  Risk for fall due to : History of fall(s) - - - -  Follow up - - - - -   Advanced Directives <no information>  Past Medical History:  Past Medical History:  Diagnosis Date  . Allergy   . Anxiety   . Chronic duodenal ulcer with hemorrhage 2012  . Chronic neck pain   . Depression   . Hyperlipidemia   . Hypertension   . Microscopic hematuria     Surgical History:  Past Surgical History:  Procedure Laterality Date  . bLEEDING ULCER  2012  . eye muscle repair  1972 and 1975    Medications:  Current Outpatient Medications on File Prior to Visit  Medication Sig  . acetaminophen (TYLENOL) 500 MG tablet Take 1 tablet (500 mg total) by mouth every 6 (six) hours as needed.  . Calcium Carbonate (CALCIUM 600 PO) Take 600 mg by mouth daily.  . Cholecalciferol (VITAMIN D3) 1000 units CAPS Take 1 capsule by mouth daily.  Marland Kitchen desloratadine-pseudoephedrine (CLARINEX-D 12-HOUR) 2.5-120 MG 12 hr tablet Take 1 tablet by mouth as needed.  . DiphenhydrAMINE HCl (DIPHEDRYL ALLERGY PO) Take 25 mg by mouth daily.  Marland Kitchen ELDERBERRY PO Take 1,000 mg by mouth 3 (three) times a week.  . FLOWFLEX COVID-19 AG HOME TEST KIT FOLLOW INSTRUCTIONS INCLUDED WITH THE PACKAGE.  . fluticasone (FLONASE) 50 MCG/ACT nasal spray Place 2 sprays into both nostrils 2 (two) times daily.  . Glucosamine-Chondroit-Vit C-Mn (GLUCOSAMINE 1500 COMPLEX PO) Take by mouth 2 (two) times daily.  Marland Kitchen HYDROcodone-acetaminophen (NORCO/VICODIN) 5-325 MG tablet Take 1 tablet by mouth daily as needed for severe pain. Must last 30 days.  Marland Kitchen ibuprofen (ADVIL,MOTRIN) 200 MG tablet Take 200 mg by mouth as needed (take 5 times a week).   . magnesium oxide (MAG-OX) 400 MG tablet Take 400 mg by mouth daily.  . Melatonin 5 MG TABS Take by mouth at  bedtime.   . Methylsulfonylmethane (MSM PO) Take by mouth daily.   . Multiple Vitamin (MULTIVITAMIN) tablet Take 1 tablet by mouth daily.  . Potassium 99 MG TABS Take 1 tablet by mouth.  . simethicone (MYLICON) 80 MG chewable tablet Chew 80 mg by mouth every 6 (six) hours as needed for flatulence.  . vitamin B-12 (CYANOCOBALAMIN) 1000 MCG tablet Take 1,000 mcg by mouth daily.  . vitamin E 180 MG (400 UNITS) capsule Take 400 Units by mouth daily.   No current facility-administered medications on file prior to visit.    Allergies:  No Known Allergies  Social History:  Social History   Socioeconomic History  . Marital status: Single    Spouse name: Not on file  . Number of children: Not on file  . Years of education: Not on file  . Highest education level: Not on file  Occupational History  . Not on file  Tobacco Use  . Smoking status: Never Smoker  . Smokeless tobacco: Never Used  Vaping Use  . Vaping  Use: Never used  Substance and Sexual Activity  . Alcohol use: No    Alcohol/week: 0.0 standard drinks  . Drug use: No  . Sexual activity: Never    Partners: Female  Other Topics Concern  . Not on file  Social History Narrative  . Not on file   Social Determinants of Health   Financial Resource Strain: Not on file  Food Insecurity: Not on file  Transportation Needs: Not on file  Physical Activity: Not on file  Stress: Not on file  Social Connections: Not on file  Intimate Partner Violence: Not on file   Social History   Tobacco Use  Smoking Status Never Smoker  Smokeless Tobacco Never Used   Social History   Substance and Sexual Activity  Alcohol Use No  . Alcohol/week: 0.0 standard drinks    Family History:  Family History  Problem Relation Age of Onset  . Diabetes Mother   . Cancer Mother        breast- in remission  . Mental illness Father   . Depression Father   . Dementia Father        VASCULAR  . Allergies Brother   . Cancer Maternal  Grandfather        colon  . Stroke Paternal Grandfather   . Dementia Paternal Grandfather        vascular    Past medical history, surgical history, medications, allergies, family history and social history reviewed with patient today and changes made to appropriate areas of the chart.   Review of Systems - negative All other ROS negative except what is listed above and in the HPI.      Objective:    BP 113/74   Pulse (!) 54   Temp 98.1 F (36.7 C) (Oral)   Ht '5\' 7"'  (1.702 m)   Wt 181 lb 9.6 oz (82.4 kg)   SpO2 99%   BMI 28.44 kg/m   Wt Readings from Last 3 Encounters:  12/24/20 181 lb 9.6 oz (82.4 kg)  12/09/20 185 lb (83.9 kg)  11/26/20 185 lb (83.9 kg)    Physical Exam Vitals and nursing note reviewed.  Constitutional:      General: He is awake. He is not in acute distress.    Appearance: He is well-developed and well-groomed. He is not ill-appearing.  HENT:     Head: Normocephalic and atraumatic.     Right Ear: Hearing, tympanic membrane, ear canal and external ear normal. No drainage.     Left Ear: Hearing, tympanic membrane, ear canal and external ear normal. No drainage.     Nose: Nose normal.     Mouth/Throat:     Pharynx: Uvula midline.  Eyes:     General: Lids are normal.        Right eye: No discharge.        Left eye: No discharge.     Extraocular Movements: Extraocular movements intact.     Conjunctiva/sclera: Conjunctivae normal.     Pupils: Pupils are equal, round, and reactive to light.     Visual Fields: Right eye visual fields normal and left eye visual fields normal.  Neck:     Thyroid: No thyromegaly.     Vascular: No carotid bruit or JVD.     Trachea: Trachea normal.  Cardiovascular:     Rate and Rhythm: Normal rate and regular rhythm.     Heart sounds: Normal heart sounds, S1 normal and S2 normal. No murmur heard. No gallop.  Pulmonary:     Effort: Pulmonary effort is normal. No accessory muscle usage or respiratory distress.      Breath sounds: Normal breath sounds.  Abdominal:     General: Bowel sounds are normal.     Palpations: Abdomen is soft. There is no hepatomegaly or splenomegaly.     Tenderness: There is no abdominal tenderness. There is no right CVA tenderness or left CVA tenderness.  Musculoskeletal:        General: Normal range of motion.     Cervical back: Normal range of motion and neck supple.     Right lower leg: No edema.     Left lower leg: No edema.  Lymphadenopathy:     Head:     Right side of head: No submental, submandibular, tonsillar, preauricular or posterior auricular adenopathy.     Left side of head: No submental, submandibular, tonsillar, preauricular or posterior auricular adenopathy.     Cervical: No cervical adenopathy.  Skin:    General: Skin is warm and dry.     Capillary Refill: Capillary refill takes less than 2 seconds.     Findings: No rash.  Neurological:     Mental Status: He is alert and oriented to person, place, and time.     Cranial Nerves: Cranial nerves are intact.     Gait: Gait is intact.     Deep Tendon Reflexes: Reflexes are normal and symmetric.     Reflex Scores:      Brachioradialis reflexes are 2+ on the right side and 2+ on the left side.      Patellar reflexes are 2+ on the right side and 2+ on the left side. Psychiatric:        Attention and Perception: Attention normal.        Mood and Affect: Mood normal.        Speech: Speech normal.        Behavior: Behavior normal. Behavior is cooperative.        Thought Content: Thought content normal.        Cognition and Memory: Cognition normal.        Judgment: Judgment normal.    Results for orders placed or performed in visit on 11/26/20  ToxASSURE Select 13 (MW), Urine  Result Value Ref Range   Summary Note       Assessment & Plan:   Problem List Items Addressed This Visit      Cardiovascular and Mediastinum   Benign essential hypertension - Primary    Chronic, ongoing.  BP at goal today.   Recommend he monitor BP at least a few mornings a week at home and document.  DASH diet at home.  Continue current medication regimen and adjust as needed.  Labs today: CMP, TSH, and urine ALB.  Return in 6 months.       Relevant Medications   atenolol (TENORMIN) 50 MG tablet   atorvastatin (LIPITOR) 40 MG tablet   fenofibrate 160 MG tablet   Other Relevant Orders   CBC with Differential/Platelet   Comprehensive metabolic panel   TSH   Microalbumin, Urine Waived     Digestive   GERD (gastroesophageal reflux disease)    Chronic, ongoing.  Continue current medication regimen and adjust as needed.  Referral to GI to discuss his history and current discomfort concerns + would benefit from colonoscopy, made him aware of this.  Discussed with him if GI does endoscopy, he could have colonoscopy at same time.  Relevant Medications   omeprazole (PRILOSEC) 20 MG capsule   ondansetron (ZOFRAN) 8 MG tablet   sucralfate (CARAFATE) 1 g tablet     Other   Chronic pain syndrome (Chronic)    Continue collaboration with chronic pain management.       Relevant Medications   baclofen (LIORESAL) 10 MG tablet   DULoxetine (CYMBALTA) 30 MG capsule   Muscle spasticity (Chronic)   Relevant Medications   baclofen (LIORESAL) 10 MG tablet   Chronic musculoskeletal pain (Chronic)   Relevant Medications   baclofen (LIORESAL) 10 MG tablet   DULoxetine (CYMBALTA) 30 MG capsule   Hyperlipidemia    Chronic, ongoing.  Continue current medication regimen and adjust as needed.  Lipid panel today.         Relevant Medications   atenolol (TENORMIN) 50 MG tablet   atorvastatin (LIPITOR) 40 MG tablet   fenofibrate 160 MG tablet   Other Relevant Orders   Lipid Panel w/o Chol/HDL Ratio   Vitamin D insufficiency    Chronic, ongoing.  Continue supplement and check Vitamin D level today.      Relevant Orders   VITAMIN D 25 Hydroxy (Vit-D Deficiency, Fractures)   Major depression in remission (HCC)     Chronic, ongoing.  Denies SI/HI.  Continue current medication regimen and adjust as needed.  Refills sent.  Return to office in 6 months.      Relevant Medications   busPIRone (BUSPAR) 30 MG tablet   DULoxetine (CYMBALTA) 30 MG capsule   hydrOXYzine (ATARAX/VISTARIL) 25 MG tablet   Anxiety    Chronic, ongoing.  Denies SI/HI.  Continue current medication regimen and adjust as needed.  Return in 6 months.      Relevant Medications   busPIRone (BUSPAR) 30 MG tablet   DULoxetine (CYMBALTA) 30 MG capsule   hydrOXYzine (ATARAX/VISTARIL) 25 MG tablet   History of bleeding peptic ulcer    Referral to GI placed.      Relevant Orders   Ambulatory referral to Gastroenterology    Other Visit Diagnoses    Prostate cancer screening       PSA on labs today.   Relevant Orders   PSA   Annual physical exam       Annual labs today to include CBC, CMP, TSH, lipid, PSA.  Health maintenance reviewed.     Discussed aspirin prophylaxis for myocardial infarction prevention and decision was it was not indicated  LABORATORY TESTING:  Health maintenance labs ordered today as discussed above.   The natural history of prostate cancer and ongoing controversy regarding screening and potential treatment outcomes of prostate cancer has been discussed with the patient. The meaning of a false positive PSA and a false negative PSA has been discussed. He indicates understanding of the limitations of this screening test and wishes to proceed with screening PSA testing.   IMMUNIZATIONS:   - Tdap: Tetanus vaccination status reviewed: last tetanus booster within 10 years. - Influenza: Up to date - Pneumovax: Not applicable - Prevnar: Not applicable - Zostavax vaccine: Refused  SCREENING: - Colonoscopy: Ordered today  Discussed with patient purpose of the colonoscopy is to detect colon cancer at curable precancerous or early stages   - AAA Screening: Not applicable  -Hearing Test: Not applicable   -Spirometry: Not applicable   PATIENT COUNSELING:    Sexuality: Discussed sexually transmitted diseases, partner selection, use of condoms, avoidance of unintended pregnancy  and contraceptive alternatives.   Advised to avoid cigarette smoking.  I discussed  with the patient that most people either abstain from alcohol or drink within safe limits (<=14/week and <=4 drinks/occasion for males, <=7/weeks and <= 3 drinks/occasion for females) and that the risk for alcohol disorders and other health effects rises proportionally with the number of drinks per week and how often a drinker exceeds daily limits.  Discussed cessation/primary prevention of drug use and availability of treatment for abuse.   Diet: Encouraged to adjust caloric intake to maintain  or achieve ideal body weight, to reduce intake of dietary saturated fat and total fat, to limit sodium intake by avoiding high sodium foods and not adding table salt, and to maintain adequate dietary potassium and calcium preferably from fresh fruits, vegetables, and low-fat dairy products.    Stressed the importance of regular exercise  Injury prevention: Discussed safety belts, safety helmets, smoke detector, smoking near bedding or upholstery.   Dental health: Discussed importance of regular tooth brushing, flossing, and dental visits.   Follow up plan: NEXT PREVENTATIVE PHYSICAL DUE IN 1 YEAR. Return in about 6 months (around 06/23/2021) for HTN/HLD, MOOD, GERD.

## 2020-12-24 NOTE — Assessment & Plan Note (Signed)
Chronic, ongoing.  Continue supplement and check Vitamin D level today.

## 2020-12-24 NOTE — Assessment & Plan Note (Signed)
Continue collaboration with chronic pain management.

## 2020-12-24 NOTE — Assessment & Plan Note (Signed)
Referral to GI placed

## 2020-12-24 NOTE — Assessment & Plan Note (Signed)
Chronic, ongoing.  Denies SI/HI.  Continue current medication regimen and adjust as needed.  Refills sent.  Return to office in 6 months.

## 2020-12-24 NOTE — Assessment & Plan Note (Signed)
Chronic, ongoing.  Continue current medication regimen and adjust as needed.  Referral to GI to discuss his history and current discomfort concerns + would benefit from colonoscopy, made him aware of this.  Discussed with him if GI does endoscopy, he could have colonoscopy at same time.

## 2020-12-24 NOTE — Patient Instructions (Signed)
Health Maintenance, Male Adopting a healthy lifestyle and getting preventive care are important in promoting health and wellness. Ask your health care provider about:  The right schedule for you to have regular tests and exams.  Things you can do on your own to prevent diseases and keep yourself healthy. What should I know about diet, weight, and exercise? Eat a healthy diet  Eat a diet that includes plenty of vegetables, fruits, low-fat dairy products, and lean protein.  Do not eat a lot of foods that are high in solid fats, added sugars, or sodium.   Maintain a healthy weight Body mass index (BMI) is a measurement that can be used to identify possible weight problems. It estimates body fat based on height and weight. Your health care provider can help determine your BMI and help you achieve or maintain a healthy weight. Get regular exercise Get regular exercise. This is one of the most important things you can do for your health. Most adults should:  Exercise for at least 150 minutes each week. The exercise should increase your heart rate and make you sweat (moderate-intensity exercise).  Do strengthening exercises at least twice a week. This is in addition to the moderate-intensity exercise.  Spend less time sitting. Even light physical activity can be beneficial. Watch cholesterol and blood lipids Have your blood tested for lipids and cholesterol at 55 years of age, then have this test every 5 years. You may need to have your cholesterol levels checked more often if:  Your lipid or cholesterol levels are high.  You are older than 55 years of age.  You are at high risk for heart disease. What should I know about cancer screening? Many types of cancers can be detected early and may often be prevented. Depending on your health history and family history, you may need to have cancer screening at various ages. This may include screening for:  Colorectal cancer.  Prostate  cancer.  Skin cancer.  Lung cancer. What should I know about heart disease, diabetes, and high blood pressure? Blood pressure and heart disease  High blood pressure causes heart disease and increases the risk of stroke. This is more likely to develop in people who have high blood pressure readings, are of African descent, or are overweight.  Talk with your health care provider about your target blood pressure readings.  Have your blood pressure checked: ? Every 3-5 years if you are 55-22 years of age. ? Every year if you are 41 years old or older.  If you are between the ages of 16 and 56 and are a current or former smoker, ask your health care provider if you should have a one-time screening for abdominal aortic aneurysm (AAA). Diabetes Have regular diabetes screenings. This checks your fasting blood sugar level. Have the screening done:  Once every three years after age 70 if you are at a normal weight and have a low risk for diabetes.  More often and at a younger age if you are overweight or have a high risk for diabetes. What should I know about preventing infection? Hepatitis B If you have a higher risk for hepatitis B, you should be screened for this virus. Talk with your health care provider to find out if you are at risk for hepatitis B infection. Hepatitis C Blood testing is recommended for:  Everyone born from [birth year] through 1965.  Anyone with known risk factors for hepatitis C. Sexually transmitted infections (STIs)  You should be screened  each year for STIs, including gonorrhea and chlamydia, if: ? You are sexually active and are younger than 55 years of age. ? You are older than 55 years of age and your health care provider tells you that you are at risk for this type of infection. ? Your sexual activity has changed since you were last screened, and you are at increased risk for chlamydia or gonorrhea. Ask your health care provider if you are at risk.  Ask your  health care provider about whether you are at high risk for HIV. Your health care provider may recommend a prescription medicine to help prevent HIV infection. If you choose to take medicine to prevent HIV, you should first get tested for HIV. You should then be tested every 3 months for as long as you are taking the medicine. Follow these instructions at home: Lifestyle  Do not use any products that contain nicotine or tobacco, such as cigarettes, e-cigarettes, and chewing tobacco. If you need help quitting, ask your health care provider.  Do not use street drugs.  Do not share needles.  Ask your health care provider for help if you need support or information about quitting drugs. Alcohol use  Do not drink alcohol if your health care provider tells you not to drink.  If you drink alcohol: ? Limit how much you have to 0-2 drinks a day. ? Be aware of how much alcohol is in your drink. In the [place] one drink equals one 12 oz bottle of beer (355 mL), one 5 oz glass of wine (148 mL), or one 1 oz glass of hard liquor (44 mL). General instructions  Schedule regular health, dental, and eye exams.  Stay current with your vaccines.  Tell your health care provider if: ? You often feel depressed. ? You have ever been abused or do not feel safe at home. Summary  Adopting a healthy lifestyle and getting preventive care are important in promoting health and wellness.  Follow your health care provider's instructions about healthy diet, exercising, and getting tested or screened for diseases.  Follow your health care provider's instructions on monitoring your cholesterol and blood pressure. This information is not intended to replace advice given to you by your health care provider. Make sure you discuss any questions you have with your health care provider. Document Revised: 10/11/2018 Document Reviewed: 10/11/2018 Elsevier Patient Education  2021 [place] [employer]  [facility]) Exercise Recommendation  Being physically active is important to prevent heart disease and stroke, the nation's No. 1and No. 5killers. To improve overall cardiovascular health, we suggest at least 150 minutes per week of moderate exercise or 75 minutes per week of vigorous exercise (or a combination of moderate and vigorous activity). Thirty minutes a day, five times a week is an easy goal to remember. You will also experience benefits even if you divide your time into two or three segments of 10 to 15 minutes per day.  For people who would benefit from lowering their blood pressure or cholesterol, we recommend 40 minutes of aerobic exercise of moderate to vigorous intensity three to four times a week to lower the risk for heart attack and stroke.  Physical activity is anything that makes you move your body and burn calories.  This includes things like climbing stairs or playing sports. Aerobic exercises benefit your heart, and include walking, jogging, swimming or biking. Strength and stretching exercises are best for overall stamina and flexibility.  The simplest, positive change you  can make to effectively improve your heart health is to start walking. It's enjoyable, free, easy, social and great exercise. A walking program is flexible and boasts high success rates because people can stick with it. It's easy for walking to become a regular and satisfying part of life.   For Overall Cardiovascular Health:  At least 30 minutes of moderate-intensity aerobic activity at least 5 days per week for a total of 150  OR   At least 25 minutes of vigorous aerobic activity at least 3 days per week for a total of 75 minutes; or a combination of moderate- and vigorous-intensity aerobic activity  AND   Moderate- to high-intensity muscle-strengthening activity at least 2 days per week for additional health benefits.  For Lowering Blood Pressure and Cholesterol  An average 40 minutes of moderate-  to vigorous-intensity aerobic activity 3 or 4 times per week  What if I can't make it to the time goal? Something is always better than nothing! And everyone has to start somewhere. Even if you've been sedentary for years, today is the day you can begin to make healthy changes in your life. If you don't think you'll make it for 30 or 40 minutes, set a reachable goal for today. You can work up toward your overall goal by increasing your time as you get stronger. Don't let all-or-nothing thinking rob you of doing what you can every day.  Source:http://www.heart.org

## 2020-12-24 NOTE — Assessment & Plan Note (Signed)
Chronic, ongoing.  Continue current medication regimen and adjust as needed. Lipid panel today. 

## 2020-12-24 NOTE — Assessment & Plan Note (Signed)
Chronic, ongoing.  BP at goal today.  Recommend he monitor BP at least a few mornings a week at home and document.  DASH diet at home.  Continue current medication regimen and adjust as needed.  Labs today: CMP, TSH, and urine ALB.  Return in 6 months.

## 2020-12-25 LAB — COMPREHENSIVE METABOLIC PANEL
ALT: 30 IU/L (ref 0–44)
AST: 26 IU/L (ref 0–40)
Albumin/Globulin Ratio: 2.1 (ref 1.2–2.2)
Albumin: 4.7 g/dL (ref 3.8–4.9)
Alkaline Phosphatase: 51 IU/L (ref 44–121)
BUN/Creatinine Ratio: 11 (ref 9–20)
BUN: 13 mg/dL (ref 6–24)
Bilirubin Total: 0.5 mg/dL (ref 0.0–1.2)
CO2: 23 mmol/L (ref 20–29)
Calcium: 9.7 mg/dL (ref 8.7–10.2)
Chloride: 101 mmol/L (ref 96–106)
Creatinine, Ser: 1.14 mg/dL (ref 0.76–1.27)
GFR calc Af Amer: 84 mL/min/{1.73_m2} (ref >59)
GFR calc non Af Amer: 73 mL/min/{1.73_m2} (ref >59)
Globulin, Total: 2.2 g/dL (ref 1.5–4.5)
Glucose: 81 mg/dL (ref 65–99)
Potassium: 4 mmol/L (ref 3.5–5.2)
Sodium: 140 mmol/L (ref 134–144)
Total Protein: 6.9 g/dL (ref 6.0–8.5)

## 2020-12-25 LAB — LIPID PANEL W/O CHOL/HDL RATIO
Cholesterol, Total: 171 mg/dL (ref 100–199)
HDL: 41 mg/dL (ref >39)
LDL Chol Calc (NIH): 101 mg/dL — ABNORMAL HIGH (ref 0–99)
Triglycerides: 164 mg/dL — ABNORMAL HIGH (ref 0–149)
VLDL Cholesterol Cal: 29 mg/dL (ref 5–40)

## 2020-12-25 LAB — CBC WITH DIFFERENTIAL/PLATELET
Basophils Absolute: 0.1 10*3/uL (ref 0.0–0.2)
Basos: 1 %
EOS (ABSOLUTE): 0.2 10*3/uL (ref 0.0–0.4)
Eos: 4 %
Hematocrit: 42 % (ref 37.5–51.0)
Hemoglobin: 14.4 g/dL (ref 13.0–17.7)
Immature Grans (Abs): 0 10*3/uL (ref 0.0–0.1)
Immature Granulocytes: 0 %
Lymphocytes Absolute: 2.4 10*3/uL (ref 0.7–3.1)
Lymphs: 36 %
MCH: 30.4 pg (ref 26.6–33.0)
MCHC: 34.3 g/dL (ref 31.5–35.7)
MCV: 89 fL (ref 79–97)
Monocytes Absolute: 0.5 10*3/uL (ref 0.1–0.9)
Monocytes: 8 %
Neutrophils Absolute: 3.3 10*3/uL (ref 1.4–7.0)
Neutrophils: 51 %
Platelets: 333 10*3/uL (ref 150–450)
RBC: 4.73 x10E6/uL (ref 4.14–5.80)
RDW: 12.5 % (ref 11.6–15.4)
WBC: 6.5 10*3/uL (ref 3.4–10.8)

## 2020-12-25 LAB — VITAMIN D 25 HYDROXY (VIT D DEFICIENCY, FRACTURES): Vit D, 25-Hydroxy: 37.9 ng/mL (ref 30.0–100.0)

## 2020-12-25 LAB — PSA: Prostate Specific Ag, Serum: 0.8 ng/mL (ref 0.0–4.0)

## 2020-12-25 LAB — TSH: TSH: 3.18 u[IU]/mL (ref 0.450–4.500)

## 2020-12-25 NOTE — Progress Notes (Signed)
Contacted via Bertie afternoon Vineeth, your labs have returned and overall look fantastic with exception of cholesterol levels showing elevation in LDL still.  I would recommend increasing Atorvastatin to 80 MG daily and stopping the 40 MG to help lower this level more.  <70 is goal for stroke prevention.  Would you be okay increasing this dose?  Let me know.  Have a great day!! Keep being awesome!!  Thank you for allowing me to participate in your care. Kindest regards, Zakyria Metzinger

## 2020-12-30 ENCOUNTER — Ambulatory Visit: Payer: 59 | Attending: Pain Medicine | Admitting: Pain Medicine

## 2020-12-30 ENCOUNTER — Other Ambulatory Visit: Payer: Self-pay

## 2020-12-30 ENCOUNTER — Encounter: Payer: Self-pay | Admitting: Pain Medicine

## 2020-12-30 VITALS — BP 128/84 | HR 76 | Temp 98.2°F | Resp 16 | Ht 67.0 in | Wt 181.0 lb

## 2020-12-30 DIAGNOSIS — M47812 Spondylosis without myelopathy or radiculopathy, cervical region: Secondary | ICD-10-CM

## 2020-12-30 DIAGNOSIS — F1190 Opioid use, unspecified, uncomplicated: Secondary | ICD-10-CM

## 2020-12-30 DIAGNOSIS — M25551 Pain in right hip: Secondary | ICD-10-CM | POA: Diagnosis not present

## 2020-12-30 DIAGNOSIS — Z79899 Other long term (current) drug therapy: Secondary | ICD-10-CM | POA: Diagnosis not present

## 2020-12-30 DIAGNOSIS — M549 Dorsalgia, unspecified: Secondary | ICD-10-CM | POA: Diagnosis not present

## 2020-12-30 DIAGNOSIS — M25511 Pain in right shoulder: Secondary | ICD-10-CM | POA: Insufficient documentation

## 2020-12-30 DIAGNOSIS — M545 Low back pain, unspecified: Secondary | ICD-10-CM | POA: Diagnosis not present

## 2020-12-30 DIAGNOSIS — M79605 Pain in left leg: Secondary | ICD-10-CM | POA: Insufficient documentation

## 2020-12-30 DIAGNOSIS — G8929 Other chronic pain: Secondary | ICD-10-CM

## 2020-12-30 DIAGNOSIS — G894 Chronic pain syndrome: Secondary | ICD-10-CM

## 2020-12-30 DIAGNOSIS — R69 Illness, unspecified: Secondary | ICD-10-CM | POA: Diagnosis not present

## 2020-12-30 DIAGNOSIS — M542 Cervicalgia: Secondary | ICD-10-CM

## 2020-12-30 MED ORDER — HYDROCODONE-ACETAMINOPHEN 5-325 MG PO TABS: 1.0000 | ORAL_TABLET | Freq: Every day | ORAL | 0 refills | Status: DC | PRN

## 2020-12-30 NOTE — Progress Notes (Signed)
Nursing Pain Medication Assessment:  Safety precautions to be maintained throughout the outpatient stay will include: orient to surroundings, keep bed in low position, maintain call bell within reach at all times, provide assistance with transfer out of bed and ambulation.  Medication Inspection Compliance: Pill count conducted under aseptic conditions, in front of the patient. Neither the pills nor the bottle was removed from the patient's sight at any time. Once count was completed pills were immediately returned to the patient in their original bottle.  Medication: Hydrocodone/APAP Pill/Patch Count: 2 of 30 pills remain Pill/Patch Appearance: Markings consistent with prescribed medication Bottle Appearance: Standard pharmacy container. Clearly labeled. Filled Date:12/05/2020 Last Medication intake:  Today

## 2020-12-30 NOTE — Progress Notes (Signed)
PROVIDER NOTE: Information contained herein reflects review and annotations entered in association with encounter. Interpretation of such information and data should be left to medically-trained personnel. Information provided to patient can be located elsewhere in the medical record under "Patient Instructions". Document created using STT-dictation technology, any transcriptional errors that may result from process are unintentional.    Patient: Jose Washington  Service Category: E/M  Provider: Gaspar Cola, MD  DOB: 17-Jan-1966  DOS: 12/30/2020  Specialty: Interventional Pain Management  MRN: [id number]  Setting: Ambulatory outpatient  PCP: Jon Billings, NP  Type: Established Patient    Referring Provider: Jon Billings, NP  Location: Office  Delivery: Face-to-face     HPI  [name], a 55 y.o. year old male, is here today because of his Chronic pain syndrome [G89.4]. [name] primary complain today is Hip Pain (right) Last encounter: My last encounter with him was on 12/09/2020. Pertinent problems: [name] has Chronic neck pain (3ry area of Pain) (Bilateral) (L>R); Chronic pain syndrome; Chronic low back pain (1ry area of Pain) (Bilateral) (L>R) w/o sciatica ; Chronic upper back pain (4th area of Pain) (Bilateral) (L>R); Chronic shoulder pain (Bilateral) (L>R); Osteoarthritis of AC (acromioclavicular) joint (Right); Chronic sacroiliac joint pain (Bilateral) (L>R); Muscle spasticity; Cervical DDD (C4-5, C5-6, C6-7 and C7-T1); Cervical foraminal stenosis (Bilateral: C5-6 & C6-7, Left: C4-5 & C7-T1); Cervical radiculitis (Bilateral) (L>R); Cervical facet syndrome (Bilateral) (L>R); Cervical spondylosis; Musculoskeletal neck pain (trapezius) (Left); Chronic knee pain (Left); Chronic lower extremity pain (2ry area of Pain) (Left); Grade 1 Retrolisthesis of L3 over L4; Lumbar facet arthropathy (Bilateral); Lumbar facet osteoarthritis; Lumbar facet syndrome (Bilateral) (R>L);  Lumbar spondylosis; Chronic right shoulder pain; Suprascapular neuropathy, right; DDD (degenerative disc disease), lumbosacral; Lumbar facet hypertrophy (Multilevel) (Bilateral); Spondylosis without myelopathy or radiculopathy, lumbosacral region; Inflammatory spondylopathy of lumbosacral region [facility]); Chronic lower extremity pain (Right); Chronic musculoskeletal pain; Abnormal MRI, lumbar spine (09/07/2017); Cervicalgia (Bilateral) (L>R); Spondylosis without myelopathy or radiculopathy, cervical region; Myofascial pain syndrome of thoracic spine (serratus muscle) (Right); Chronic flank pain (Right); Chronic hip pain (Bilateral); Chronic hip pain (Right); Enthesopathy of hip region (Right); and Osteoarthritis of hip (Right) on their pertinent problem list. Pain Assessment: Severity of Chronic pain is reported as a 1 /10. Location: Hip Right/ . Onset: More than a month ago. Quality: Aching,Dull. Timing: Intermittent. Modifying factor(s): rest, medications, walking, stretching. Vitals:  height is '5\' 7"'  (1.702 m) and weight is 181 lb (82.1 kg). His temporal temperature is 98.2 F (36.8 C). His blood pressure is 128/84 and his pulse is 76. His respiration is 16 and oxygen saturation is 100%.   Reason for encounter: both, medication management and post-procedure assessment.   The patient indicates doing well with the current medication regimen. No adverse reactions or side effects reported to the medications.  Today we have also reviewed the results of his hip x-rays as well as results of his latest UDS which came back within normal limits.  Because we have been treating this patient for quite some time and he is sincere about his mistake of having used a CBD, we have decided to give him an opportunity and we will put him back on his opioid analgesic, which we will monitor closely until we feel again comfortable in providing him with 60 and subsequently 90-day interval refills.  For now we will remade at the 30-day  intervals.  RTCB: 02/02/2021 Nonopioids transferred 08/20/2020: Baclofen  Post-Procedure Evaluation  Procedure (12/09/2020): Diagnostic right IA hip injection #  1 under fluoroscopic guidance, no sedation Pre-procedure pain level: 4/10 Post-procedure: 3/10 (< 50% relief)  Sedation: None.  Effectiveness during initial hour after procedure(Ultra-Short Term Relief): 100 %.  Local anesthetic used: Long-acting (4-6 hours) Effectiveness: Defined as any analgesic benefit obtained secondary to the administration of local anesthetics. This carries significant diagnostic value as to the etiological location, or anatomical origin, of the pain. Duration of benefit is expected to coincide with the duration of the local anesthetic used.  Effectiveness during initial 4-6 hours after procedure(Short-Term Relief): 100 %.  Long-term benefit: Defined as any relief past the pharmacologic duration of the local anesthetics.  Effectiveness past the initial 6 hours after procedure(Long-Term Relief): 95 %.  Current benefits: Defined as benefit that persist at this time.   Analgesia:  The patient indicates having attained an ongoing 90 to 95% improvement in his right hip pain.  Not only that, but he also noticed some improvement in the pain that he was experiencing in the area of the lower back as well. Function: [name] reports improvement in function ROM: [name] reports improvement in ROM  Pharmacotherapy Assessment   Analgesic: Hydrocodone/APAP 5/325, 1 tab PO QD (5 mg/day of hydrocodone) MME/day: 5 mg/day.   Monitoring: [place] PMP: PDMP reviewed during this encounter.       Pharmacotherapy: No side-effects or adverse reactions reported. Compliance: No problems identified. Effectiveness: Clinically acceptable.  Landis Martins, RN  12/30/2020  2:43 PM  Sign when Signing Visit Nursing Pain Medication Assessment:  Safety precautions to be maintained throughout the outpatient stay will include: orient to  surroundings, keep bed in low position, maintain call bell within reach at all times, provide assistance with transfer out of bed and ambulation.  Medication Inspection Compliance: Pill count conducted under aseptic conditions, in front of the patient. Neither the pills nor the bottle was removed from the patient's sight at any time. Once count was completed pills were immediately returned to the patient in their original bottle.  Medication: Hydrocodone/APAP Pill/Patch Count: 2 of 30 pills remain Pill/Patch Appearance: Markings consistent with prescribed medication Bottle Appearance: Standard pharmacy container. Clearly labeled. Filled Date:12/05/2020 Last Medication intake:  Today    UDS:  Summary  Date Value Ref Range Status  11/26/2020 Note  Final    Comment:    ==================================================================== [employer] Select 13 (MW) ==================================================================== Test                             Result       Flag       Units  Drug Present and Declared for Prescription Verification   Hydrocodone                    150          EXPECTED   ng/mg creat   Dihydrocodeine                 88           EXPECTED   ng/mg creat   Norhydrocodone                 244          EXPECTED   ng/mg creat    Sources of hydrocodone include scheduled prescription medications.    Dihydrocodeine and norhydrocodone are expected metabolites of    hydrocodone. Dihydrocodeine is also available as a scheduled    prescription medication.  ==================================================================== Test  Result    Flag   Units      Ref Range   Creatinine              425              mg/dL      >=20 ==================================================================== Declared Medications:  The flagging and interpretation on this report are based on the  following declared medications.  Unexpected results may arise from   inaccuracies in the declared medications.   **Note: The testing scope of this panel includes these medications:   Hydrocodone   **Note: The testing scope of this panel does not include the  following reported medications:   Acetaminophen  Albuterol  Atenolol  Atorvastatin  Baclofen  Buspirone  Calcium  Chondroitin  Cyanocobalamin  Desloratadine (Clarinex-D)  Diphenhydramine  Duloxetine  Fenofibrate  Fluticasone  Glucosamine  Hydroxyzine  Ibuprofen  Magnesium (Mag-Ox)  Melatonin  Methylsulfonylmethane  Multivitamin  Omeprazole  Ondansetron  Potassium  Pseudoephedrine (Clarinex-D)  Simethicone  Sucralfate  Supplement  Vitamin D3  Vitamin E ==================================================================== For clinical consultation, please call [phone]. ====================================================================      ROS  Constitutional: Denies any fever or chills Gastrointestinal: No reported hemesis, hematochezia, vomiting, or acute GI distress Musculoskeletal: Denies any acute onset joint swelling, redness, loss of ROM, or weakness Neurological: No reported episodes of acute onset apraxia, aphasia, dysarthria, agnosia, amnesia, paralysis, loss of coordination, or loss of consciousness  Medication Review  COVID-19 At Home Antigen Test, Calcium Carbonate, DULoxetine, Elderberry, Glucosamine-Chondroit-Vit C-Mn, HYDROcodone-acetaminophen, Methylsulfonylmethane, Potassium, Vitamin D3, acetaminophen, atenolol, atorvastatin, baclofen, busPIRone, desloratadine-pseudoephedrine, diphenhydrAMINE HCl, fenofibrate, fluticasone, hydrOXYzine, ibuprofen, levalbuterol, magnesium oxide, melatonin, multivitamin, omeprazole, ondansetron, simethicone, sucralfate, vitamin B-12, and vitamin E  History Review  Allergy: [name] has No Known Allergies. Drug: [name]  reports no history of drug use. Alcohol:  reports no history of alcohol use. Tobacco:   reports that he has never smoked. He has never used smokeless tobacco. Social: [name]  reports that he has never smoked. He has never used smokeless tobacco. He reports that he does not drink alcohol and does not use drugs. Medical:  has a past medical history of Allergy, Anxiety, Chronic duodenal ulcer with hemorrhage (2012), Chronic neck pain, Depression, Hyperlipidemia, Hypertension, and Microscopic hematuria. Surgical: [name]  has a past surgical history that includes eye muscle repair [phone] and 1975) and bLEEDING ULCER (2012). Family: family history includes Allergies in his brother; Cancer in his maternal grandfather and mother; Dementia in his father and paternal grandfather; Depression in his father; Diabetes in his mother; Mental illness in his father; Stroke in his paternal grandfather.  Laboratory Chemistry Profile   Renal Lab Results  Component Value Date   BUN 13 12/24/2020   CREATININE 1.14 12/24/2020   BCR 11 12/24/2020   GFRAA 84 12/24/2020   GFRNONAA 73 12/24/2020     Hepatic Lab Results  Component Value Date   AST 26 12/24/2020   ALT 30 12/24/2020   ALBUMIN 4.7 12/24/2020   ALKPHOS 51 12/24/2020   LIPASE 29 08/14/2019     Electrolytes Lab Results  Component Value Date   NA 140 12/24/2020   K 4.0 12/24/2020   CL 101 12/24/2020   CALCIUM 9.7 12/24/2020   MG 2.0 11/12/2016     Bone Lab Results  Component Value Date   VD25OH 37.9 12/24/2020   25OHVITD1 23 (L) 11/12/2016   25OHVITD2 <1.0 11/12/2016   25OHVITD3 23 11/12/2016     Inflammation (CRP:  Acute Phase) (ESR: Chronic Phase) Lab Results  Component Value Date   CRP 1.4 (H) 11/12/2016   ESRSEDRATE 3 11/12/2016       Note: Above Lab results reviewed.  Recent Imaging Review  DG PAIN CLINIC C-ARM 1-60 MIN NO REPORT Fluoro was used, but no Radiologist interpretation will be provided.  Please refer to "NOTES" tab for provider progress note. Note: Reviewed        Physical Exam  General  appearance: Well nourished, well developed, and well hydrated. In no apparent acute distress Mental status: Alert, oriented x 3 (person, place, & time)       Respiratory: No evidence of acute respiratory distress Eyes: PERLA Vitals: BP 128/84   Pulse 76   Temp 98.2 F (36.8 C) (Temporal)   Resp 16   Ht '5\' 7"'  (1.702 m)   Wt 181 lb (82.1 kg)   SpO2 100%   BMI 28.35 kg/m  BMI: Estimated body mass index is 28.35 kg/m as calculated from the following:   Height as of this encounter: '5\' 7"'  (1.702 m).   Weight as of this encounter: 181 lb (82.1 kg). Ideal: Ideal body weight: 66.1 kg (145 lb 11.6 oz) Adjusted ideal body weight: 72.5 kg (159 lb 13.4 oz)  Assessment   Status Diagnosis  Controlled Controlled Controlled 1. Chronic pain syndrome   2. Chronic low back pain (1ry area of Pain) (Bilateral) (L>R) w/o sciatica    3. Chronic lower extremity pain (2ry area of Pain) (Left)   4. Chronic neck pain (3ry area of Pain) (Bilateral) (L>R)   5. Chronic upper back pain (4th area of Pain) (Bilateral) (L>R)   6. Chronic hip pain (Right)   7. Chronic right shoulder pain   8. Cervicalgia (Bilateral) (L>R)   9. Cervical facet syndrome (Bilateral) (L>R)   10. Pharmacologic therapy   11. Opiate use (5 MME/Day)      Updated Problems: Problem  Chronic lower extremity pain (2ry area of Pain) (Left)  Chronic low back pain (1ry area of Pain) (Bilateral) (L>R) w/o sciatica   Chronic upper back pain (4th area of Pain) (Bilateral) (L>R)   Formatting of this note might be different from the original. Last Assessment & Plan:  Per pain clinic   Chronic neck pain (3ry area of Pain) (Bilateral) (L>R)   Pt seems to be doing well with present medications.  Uses Hydrocodone sparingly.  Sees a chiropractor for regular treatments.       Plan of Care  Problem-specific:  No problem-specific Assessment & Plan notes found for this encounter.  [name] has a current medication list which  includes the following long-term medication(s): atenolol, atorvastatin, baclofen, calcium carbonate, diphenhydramine hcl, duloxetine, fenofibrate, [START ON 01/03/2021] hydrocodone-acetaminophen, levalbuterol, omeprazole, potassium, simethicone, and sucralfate.  Pharmacotherapy (Medications Ordered): Meds ordered this encounter  Medications  . HYDROcodone-acetaminophen (NORCO/VICODIN) 5-325 MG tablet    Sig: Take 1 tablet by mouth daily as needed for severe pain. Must last 30 days.    Dispense:  30 tablet    Refill:  0    Chronic Pain: STOP Act (Not applicable) Fill 1 day early if closed on refill date. Avoid benzodiazepines within 8 hours of opioids   Orders:  No orders of the defined types were placed in this encounter.  Follow-up plan:   Return in about 1 month (around 02/02/2021) for (F2F), (Med Mgmt).      Interventional Therapies  Risk  Complexity Considerations:   WNL   Planned  Pending:   Abnormal UDS (03/27/2020) & (08/20/20) (+) undisclosed carboxy-THC    Under consideration:   Possible left lumbar facet RFA  Diagnostic left cervical facetblock  Possible left cervical facet RFA  Diagnostic bilateral sacroiliac joint block Possible bilateral sacroiliac joint RFA Diagnostic trigger point injections   Completed:   Diagnostic/therapeutic right IA hip injection x1 (12/09/2020)  Palliative right lumbar facet RFA x1 (11/22/2019) Palliative right lumbar facetMBB x2  Diagnostic/therapeutic left lumbar facetMBB x1  Diagnostic/therapeutic left L4-5LESIx2 Palliative left CESIx3 Diagnostic right cervical facet MBB x1 (03/13/2020) (100/100/100) Diagnostic left cervical facet MBB x1 (01/10/2020) (100/100/100)   Therapeutic  Palliative (PRN) options:   Palliative right lumbar facet RFA #2  Palliative right lumbar facetblock #3  Diagnostic/therapeutic left lumbar facetblock #2  Diagnostic/therapeutic left L4-5LESI#3 Palliative left CESI#4 Diagnostic right-sided  cervical facet block #2  Diagnostic left-sided cervical facet block #2     Recent Visits Date Type Provider Dept  12/09/20 Procedure visit Milinda Pointer, MD [facility]  11/26/20 Office Visit Milinda Pointer, MD [facility]  Showing recent visits within past 90 days and meeting all other requirements Today's Visits Date Type Provider Dept  12/30/20 Office Visit Milinda Pointer, MD [facility]  Showing today's visits and meeting all other requirements Future Appointments Date Type Provider Dept  02/02/21 Appointment Milinda Pointer, MD [facility]  Showing future appointments within next 90 days and meeting all other requirements  I discussed the assessment and treatment plan with the patient. The patient was provided an opportunity to ask questions and all were answered. The patient agreed with the plan and demonstrated an understanding of the instructions.  Patient advised to call back or seek an in-person evaluation if the symptoms or condition worsens.  Duration of encounter: 34 minutes.  Note by: Gaspar Cola, MD Date: 12/30/2020; Time: 4:20 PM

## 2020-12-30 NOTE — Patient Instructions (Signed)
____________________________________________________________________________________________  Medication Rules  Purpose: To inform patients, and their family members, of our rules and regulations.  Applies to: All patients receiving prescriptions (written or electronic).  Pharmacy of record: Pharmacy where electronic prescriptions will be sent. If written prescriptions are taken to a different pharmacy, please inform the nursing staff. The pharmacy listed in the electronic medical record should be the one where you would like electronic prescriptions to be sent.  Electronic prescriptions: In compliance with the [place] Strengthen Opioid Misuse Prevention (STOP) Act of 2017 (Session Law 2017-74/H243), effective November 01, 2018, all controlled substances must be electronically prescribed. Calling prescriptions to the pharmacy will cease to exist.  Prescription refills: Only during scheduled appointments. Applies to all prescriptions.  NOTE: The following applies primarily to controlled substances (Opioid* Pain Medications).   Type of encounter (visit): For patients receiving controlled substances, face-to-face visits are required. (Not an option or up to the patient.)  Patient's responsibilities: 1. Pain Pills: Bring all pain pills to every appointment (except for procedure appointments). 2. Pill Bottles: Bring pills in original pharmacy bottle. Always bring the newest bottle. Bring bottle, even if empty. 3. Medication refills: You are responsible for knowing and keeping track of what medications you take and those you need refilled. The day before your appointment: write a list of all prescriptions that need to be refilled. The day of the appointment: give the list to the admitting nurse. Prescriptions will be written only during appointments. No prescriptions will be written on procedure days. If you forget a medication: it will not be "Called in", "Faxed", or "electronically sent".  You will need to get another appointment to get these prescribed. No early refills. Do not call asking to have your prescription filled early. 4. Prescription Accuracy: You are responsible for carefully inspecting your prescriptions before leaving our office. Have the discharge nurse carefully go over each prescription with you, before taking them home. Make sure that your name is accurately spelled, that your address is correct. Check the name and dose of your medication to make sure it is accurate. Check the number of pills, and the written instructions to make sure they are clear and accurate. Make sure that you are given enough medication to last until your next medication refill appointment. 5. Taking Medication: Take medication as prescribed. When it comes to controlled substances, taking less pills or less frequently than prescribed is permitted and encouraged. Never take more pills than instructed. Never take medication more frequently than prescribed.  6. Inform other Doctors: Always inform, all of your healthcare providers, of all the medications you take. 7. Pain Medication from other Providers: You are not allowed to accept any additional pain medication from any other Doctor or Healthcare provider. There are two exceptions to this rule. (see below) In the event that you require additional pain medication, you are responsible for notifying us, as stated below. 8. Cough Medicine: Often these contain an opioid, such as codeine or hydrocodone. Never accept or take cough medicine containing these opioids if you are already taking an opioid* medication. The combination may cause respiratory failure and death. 9. Medication Agreement: You are responsible for carefully reading and following our Medication Agreement. This must be signed before receiving any prescriptions from our practice. Safely store a copy of your signed Agreement. Violations to the Agreement will result in no further prescriptions.  (Additional copies of our Medication Agreement are available upon request.) 10. Laws, Rules, & Regulations: All patients are expected to follow all   Federal and State Laws, Statutes, Rules, & Regulations. Ignorance of the Laws does not constitute a valid excuse.  11. Illegal drugs and Controlled Substances: The use of illegal substances (including, but not limited to marijuana and its derivatives) and/or the illegal use of any controlled substances is strictly prohibited. Violation of this rule may result in the immediate and permanent discontinuation of any and all prescriptions being written by our practice. The use of any illegal substances is prohibited. 12. Adopted CDC guidelines & recommendations: Target dosing levels will be at or below 60 MME/day. Use of benzodiazepines** is not recommended.  Exceptions: There are only two exceptions to the rule of not receiving pain medications from other Healthcare Providers. 1. Exception #1 (Emergencies): In the event of an emergency (i.e.: accident requiring emergency care), you are allowed to receive additional pain medication. However, you are responsible for: As soon as you are able, call our office [phone], at any time of the day or night, and leave a message stating your name, the date and nature of the emergency, and the name and dose of the medication prescribed. In the event that your call is answered by a member of our staff, make sure to document and save the date, time, and the name of the person that took your information.  2. Exception #2 (Planned Surgery): In the event that you are scheduled by another doctor or dentist to have any type of surgery or procedure, you are allowed (for a period no longer than 30 days), to receive additional pain medication, for the acute post-op pain. However, in this case, you are responsible for picking up a copy of our "Post-op Pain Management for Surgeons" handout, and giving it to your surgeon or dentist. This  document is available at our office, and does not require an appointment to obtain it. Simply go to our office during business hours (Monday-Thursday from 8:00 AM to 4:00 PM) (Friday 8:00 AM to 12:00 Noon) or if you have a scheduled appointment with us, prior to your surgery, and ask for it by name. In addition, you are responsible for: calling our office [phone], at any time of the day or night, and leaving a message stating your name, name of your surgeon, type of surgery, and date of procedure or surgery. Failure to comply with your responsibilities may result in termination of therapy involving the controlled substances.  *Opioid medications include: morphine, codeine, oxycodone, oxymorphone, hydrocodone, hydromorphone, meperidine, tramadol, tapentadol, buprenorphine, fentanyl, methadone. **Benzodiazepine medications include: diazepam (Valium), alprazolam (Xanax), clonazepam (Klonopine), lorazepam (Ativan), clorazepate (Tranxene), chlordiazepoxide (Librium), estazolam (Prosom), oxazepam (Serax), temazepam (Restoril), triazolam (Halcion) (Last updated: 09/29/2020) ____________________________________________________________________________________________   ____________________________________________________________________________________________  Medication Recommendations and Reminders  Applies to: All patients receiving prescriptions (written and/or electronic).  Medication Rules & Regulations: These rules and regulations exist for your safety and that of others. They are not flexible and neither are we. Dismissing or ignoring them will be considered "non-compliance" with medication therapy, resulting in complete and irreversible termination of such therapy. (See document titled "Medication Rules" for more details.) In all conscience, because of safety reasons, we cannot continue providing a therapy where the patient does not follow instructions.  Pharmacy of record:   Definition:  This is the pharmacy where your electronic prescriptions will be sent.   We do not endorse any particular pharmacy, however, we have experienced problems with [facility] not securing enough medication supply for the community.  We do not restrict you in your choice of pharmacy. However,   once we write for your prescriptions, we will NOT be re-sending more prescriptions to fix restricted supply problems created by your pharmacy, or your insurance.   The pharmacy listed in the electronic medical record should be the one where you want electronic prescriptions to be sent.  If you choose to change pharmacy, simply notify our nursing staff.  Recommendations:  Keep all of your pain medications in a safe place, under lock and key, even if you live alone. We will NOT replace lost, stolen, or damaged medication.  After you fill your prescription, take 1 week's worth of pills and put them away in a safe place. You should keep a separate, properly labeled bottle for this purpose. The remainder should be kept in the original bottle. Use this as your primary supply, until it runs out. Once it's gone, then you know that you have 1 week's worth of medicine, and it is time to come in for a prescription refill. If you do this correctly, it is unlikely that you will ever run out of medicine.  To make sure that the above recommendation works, it is very important that you make sure your medication refill appointments are scheduled at least 1 week before you run out of medicine. To do this in an effective manner, make sure that you do not leave the office without scheduling your next medication management appointment. Always ask the nursing staff to show you in your prescription , when your medication will be running out. Then arrange for the receptionist to get you a return appointment, at least 7 days before you run out of medicine. Do not wait until you have 1 or 2 pills left, to come in. This is very poor planning and  does not take into consideration that we may need to cancel appointments due to bad weather, sickness, or emergencies affecting our staff.  DO NOT ACCEPT A "Partial Fill": If for any reason your pharmacy does not have enough pills/tablets to completely fill or refill your prescription, do not allow for a "partial fill". The law allows the pharmacy to complete that prescription within 72 hours, without requiring a new prescription. If they do not fill the rest of your prescription within those 72 hours, you will need a separate prescription to fill the remaining amount, which we will NOT provide. If the reason for the partial fill is your insurance, you will need to talk to the pharmacist about payment alternatives for the remaining tablets, but again, DO NOT ACCEPT A PARTIAL FILL, unless you can trust your pharmacist to obtain the remainder of the pills within 72 hours.  Prescription refills and/or changes in medication(s):   Prescription refills, and/or changes in dose or medication, will be conducted only during scheduled medication management appointments. (Applies to both, written and electronic prescriptions.)  No refills on procedure days. No medication will be changed or started on procedure days. No changes, adjustments, and/or refills will be conducted on a procedure day. Doing so will interfere with the diagnostic portion of the procedure.  No phone refills. No medications will be "called into the pharmacy".  No Fax refills.  No weekend refills.  No Holliday refills.  No after hours refills.  Remember:  Business hours are:  Monday to Thursday 8:00 AM to 4:00 PM Provider's Schedule: Kalenna Millett, MD - Appointments are:  Medication management: Monday and Wednesday 8:00 AM to 4:00 PM Procedure day: Tuesday and Thursday 7:30 AM to 4:00 PM Bilal Lateef, MD - Appointments are:    Medication management: Tuesday and Thursday 8:00 AM to 4:00 PM Procedure day: Monday and Wednesday  7:30 AM to 4:00 PM (Last update: 05/21/2020) ____________________________________________________________________________________________    

## 2021-01-05 ENCOUNTER — Encounter: Payer: Self-pay | Admitting: *Deleted

## 2021-01-06 ENCOUNTER — Telehealth: Payer: Self-pay | Admitting: Pain Medicine

## 2021-01-06 DIAGNOSIS — Z79899 Other long term (current) drug therapy: Secondary | ICD-10-CM

## 2021-01-06 DIAGNOSIS — F1190 Opioid use, unspecified, uncomplicated: Secondary | ICD-10-CM

## 2021-01-06 DIAGNOSIS — G894 Chronic pain syndrome: Secondary | ICD-10-CM

## 2021-01-06 NOTE — Telephone Encounter (Signed)
Will you please send a script to [facility] for Hydrocodone?

## 2021-01-06 NOTE — Telephone Encounter (Signed)
Patient is unable to get his pain meds at his regular pharmacy. They do not have it. I ask him to call around and find one or two that have the medicine. Please see if we can get Dr. Andree Elk or Dr. Holley Raring to send script to different pharmacy. [facility] has been out 4 days already. Please call patient

## 2021-01-06 NOTE — Telephone Encounter (Signed)
Left message asking patient to call some other pharmacies to find out which has Hydrocodone, then let us know. I will ask Dr. Holley Raring to send a script.

## 2021-01-07 MED ORDER — HYDROCODONE-ACETAMINOPHEN 5-325 MG PO TABS: 1.0000 | ORAL_TABLET | Freq: Every day | ORAL | 0 refills | Status: DC | PRN

## 2021-01-07 NOTE — Telephone Encounter (Signed)
Patient notified per voicemail. 

## 2021-01-07 NOTE — Addendum Note (Signed)
Addended by: Gillis Santa on: 01/07/2021 11:19 AM   Modules accepted: Orders

## 2021-01-20 DIAGNOSIS — I700 Atherosclerosis of aorta: Secondary | ICD-10-CM | POA: Insufficient documentation

## 2021-01-28 NOTE — Progress Notes (Signed)
PROVIDER NOTE: Information contained herein reflects review and annotations entered in association with encounter. Interpretation of such information and data should be left to medically-trained personnel. Information provided to patient can be located elsewhere in the medical record under "Patient Instructions". Document created using STT-dictation technology, any transcriptional errors that may result from process are unintentional.    Patient: Jose Washington  Service Category: E/M  Provider: Gaspar Cola, MD  DOB: November 12, 1965  DOS: 02/02/2021  Specialty: Interventional Pain Management  MRN: [id number]  Setting: Ambulatory outpatient  PCP: Jose Billings, NP  Type: Established Patient    Referring Provider: Jon Billings, NP  Location: Office  Delivery: Face-to-face     HPI  Jose Washington, a 55 y.o. year old male, is here today because of his Chronic pain syndrome [G89.4]. Jose Washington primary complain today is Back Pain Last encounter: My last encounter with him was on 01/06/2021. Pertinent problems: Jose Washington has Chronic neck pain (3ry area of Pain) (Bilateral) (L>R); Chronic pain syndrome; Chronic low back pain (1ry area of Pain) (Bilateral) (L>R) w/o sciatica ; Chronic upper back pain (4th area of Pain) (Bilateral) (L>R); Chronic shoulder pain (Bilateral) (L>R); Osteoarthritis of AC (acromioclavicular) joint (Right); Chronic sacroiliac joint pain (Bilateral) (L>R); Muscle spasticity; Cervical DDD (C4-5, C5-6, C6-7 and C7-T1); Cervical foraminal stenosis (Bilateral: C5-6 & C6-7, Left: C4-5 & C7-T1); Cervical radiculitis (Bilateral) (L>R); Cervical facet syndrome (Bilateral) (L>R); Cervical spondylosis; Musculoskeletal neck pain (trapezius) (Left); Chronic knee pain (Left); Chronic lower extremity pain (2ry area of Pain) (Left); Grade 1 Retrolisthesis of L3 over L4; Lumbar facet arthropathy (Bilateral); Lumbar facet osteoarthritis; Lumbar facet syndrome (Bilateral) (R>L); Lumbar  spondylosis; Chronic right shoulder pain; Suprascapular neuropathy, right; DDD (degenerative disc disease), lumbosacral; Lumbar facet hypertrophy (Multilevel) (Bilateral); Spondylosis without myelopathy or radiculopathy, lumbosacral region; Inflammatory spondylopathy of lumbosacral region [facility]); Chronic lower extremity pain (Right); Chronic musculoskeletal pain; Abnormal MRI, lumbar spine (09/07/2017); Cervicalgia (Bilateral) (L>R); Spondylosis without myelopathy or radiculopathy, cervical region; Myofascial pain syndrome of thoracic spine (serratus muscle) (Right); Chronic flank pain (Right); Chronic hip pain (Bilateral); Chronic hip pain (Right); Enthesopathy of hip region (Right); and Osteoarthritis of hip (Right) on their pertinent problem list. Pain Assessment: Severity of Chronic pain is reported as a 1 /10. Location: Back Lower/Denies. Onset: More than a month ago. Quality: Aching,Burning,Sharp. Timing: Intermittent. Modifying factor(s): laying down, meds. Vitals:  height is '5\' 7"'  (1.702 m) and weight is 184 lb (83.5 kg). His temperature is 97.2 F (36.2 C) (abnormal). His blood pressure is 129/85 and his pulse is 68. His oxygen saturation is 100%.   Reason for encounter: medication management.  On 01/06/2021 we received a phone call indicating that the patient was unable to get his hydrocodone as his usual pharmacy.  This happened while I was out of medication and my partner, Dr. Holley Washington, assisted in sending a prescription to a different pharmacy, while covering my vacation.  The patient indicates doing well with the current medication regimen. No adverse reactions or side effects reported to the medications.  Apparently, once Dr. Holley Washington had sent the prescription to a different pharmacy, [facility] went ahead and filled his prescription.  He came in today with a total of 32 pills and therefore this should last him for another 32 days from now.  We have taken this into account when writing his next refills and in  view of the fact that he has been honest with [place] about the entire process and he has followed all of our  rules and regulations, I will provide him today with 2 prescriptions.  He should be good until 05/05/2021.  Today he indicated having some pain in the right lower back which upon physical examination was exacerbated by hyperextension and rotation as well as Sandrea Matte.  Today I will enter an order for a therapeutic facet block to keep this down.  If by any chance that this does not get approved, then we will need to do radiofrequency ablation.  RTCB: 05/05/2021 Nonopioids transferred 08/20/2020: Baclofen  Pharmacotherapy Assessment   Analgesic: Hydrocodone/APAP 5/325, 1 tab PO QD (5 mg/day of hydrocodone) MME/day: 5 mg/day.   Monitoring: [place] PMP: PDMP reviewed during this encounter.       Pharmacotherapy: No side-effects or adverse reactions reported. Compliance: No problems identified. Effectiveness: Clinically acceptable.  Jose Fischer, RN  02/02/2021  2:10 PM  Sign when Signing Visit  Safety precautions to be maintained throughout the outpatient stay will include: orient to surroundings, keep bed in low position, maintain call bell within reach at all times, provide assistance with transfer out of bed and ambulation. Nursing Pain Medication Assessment:  Safety precautions to be maintained throughout the outpatient stay will include: orient to surroundings, keep bed in low position, maintain call bell within reach at all times, provide assistance with transfer out of bed and ambulation.  Medication Inspection Compliance: Pill count conducted under aseptic conditions, in front of the patient. Neither the pills nor the bottle was removed from the patient's sight at any time. Once count was completed pills were immediately returned to the patient in their original bottle.  Medication #1: Hydrocodone/APAP Pill/Patch Count: 2 of 30 pills remain Pill/Patch Appearance: Markings consistent with  prescribed medication Bottle Appearance: Standard pharmacy container. Clearly labeled. Filled Date: 3 / 9 / 22 Last Medication intake:  Today  Medication #2: Hydrocodone/APAP Pill/Patch Count: 30 of 30 pills remain Pill/Patch Appearance: Markings consistent with prescribed medication Bottle Appearance: Standard pharmacy container. Clearly labeled. Filled Date: 3 / 80 / 22 Last Medication intake:  have not use this bottle yet    UDS:  Summary  Date Value Ref Range Status  11/26/2020 Note  Final    Comment:    ==================================================================== [employer] [facility] 13 (MW) ==================================================================== Test                             Result       Flag       Units  Drug Present and Declared for Prescription Verification   Hydrocodone                    150          EXPECTED   ng/mg creat   Dihydrocodeine                 88           EXPECTED   ng/mg creat   Norhydrocodone                 244          EXPECTED   ng/mg creat    Sources of hydrocodone include scheduled prescription medications.    Dihydrocodeine and norhydrocodone are expected metabolites of    hydrocodone. Dihydrocodeine is also available as a scheduled    prescription medication.  ==================================================================== Test  Result    Flag   Units      Ref Range   Creatinine              425              mg/dL      >=20 ==================================================================== Declared Medications:  The flagging and interpretation on this report are based on the  following declared medications.  Unexpected results may arise from  inaccuracies in the declared medications.   **Note: The testing scope of this panel includes these medications:   Hydrocodone   **Note: The testing scope of this panel does not include the  following reported medications:   Acetaminophen  Albuterol   Atenolol  Atorvastatin  Baclofen  Buspirone  Calcium  Chondroitin  Cyanocobalamin  Desloratadine (Clarinex-D)  Diphenhydramine  Duloxetine  Fenofibrate  Fluticasone  Glucosamine  Hydroxyzine  Ibuprofen  Magnesium (Mag-Ox)  Melatonin  Methylsulfonylmethane  Multivitamin  Omeprazole  Ondansetron  Potassium  Pseudoephedrine (Clarinex-D)  Simethicone  Sucralfate  Supplement  Vitamin D3  Vitamin E ==================================================================== For clinical consultation, please call [phone]. ====================================================================      ROS  Constitutional: Denies any fever or chills Gastrointestinal: No reported hemesis, hematochezia, vomiting, or acute GI distress Musculoskeletal: Denies any acute onset joint swelling, redness, loss of ROM, or weakness Neurological: No reported episodes of acute onset apraxia, aphasia, dysarthria, agnosia, amnesia, paralysis, loss of coordination, or loss of consciousness  Medication Review  COVID-19 At Home Antigen Test, Calcium Carbonate, DULoxetine, Elderberry, Glucosamine-Chondroit-Vit C-Mn, HYDROcodone-acetaminophen, Methylsulfonylmethane, Potassium, Vitamin D3, acetaminophen, atenolol, atorvastatin, baclofen, busPIRone, desloratadine-pseudoephedrine, diphenhydrAMINE HCl, fenofibrate, fluticasone, hydrOXYzine, ibuprofen, levalbuterol, magnesium oxide, melatonin, multivitamin, omeprazole, ondansetron, simethicone, sucralfate, vitamin B-12, and vitamin E  History Review  Allergy: Jose Washington has No Known Allergies. Drug: Jose Washington  reports no history of drug use. Alcohol:  reports no history of alcohol use. Tobacco:  reports that he has never smoked. He has never used smokeless tobacco. Social: Jose Washington  reports that he has never smoked. He has never used smokeless tobacco. He reports that he does not drink alcohol and does not use drugs. Medical:  has a past medical history  of Allergy, Anxiety, Chronic duodenal ulcer with hemorrhage (2012), Chronic neck pain, Depression, Hyperlipidemia, Hypertension, and Microscopic hematuria. Surgical: Jose Washington  has a past surgical history that includes eye muscle repair [phone] and 1975) and bLEEDING ULCER (2012). Family: family history includes Allergies in his brother; Cancer in his maternal grandfather and mother; Dementia in his father and paternal grandfather; Depression in his father; Diabetes in his mother; Mental illness in his father; Stroke in his paternal grandfather.  Laboratory Chemistry Profile   Renal Lab Results  Component Value Date   BUN 13 12/24/2020   CREATININE 1.14 12/24/2020   BCR 11 12/24/2020   GFRAA 84 12/24/2020   GFRNONAA 73 12/24/2020     Hepatic Lab Results  Component Value Date   AST 26 12/24/2020   ALT 30 12/24/2020   ALBUMIN 4.7 12/24/2020   ALKPHOS 51 12/24/2020   LIPASE 29 08/14/2019     Electrolytes Lab Results  Component Value Date   NA 140 12/24/2020   K 4.0 12/24/2020   CL 101 12/24/2020   CALCIUM 9.7 12/24/2020   MG 2.0 11/12/2016     Bone Lab Results  Component Value Date   VD25OH 37.9 12/24/2020   25OHVITD1 23 (L) 11/12/2016   25OHVITD2 <1.0 11/12/2016   25OHVITD3 23 11/12/2016     Inflammation (CRP:  Acute Phase) (ESR: Chronic Phase) Lab Results  Component Value Date   CRP 1.4 (H) 11/12/2016   ESRSEDRATE 3 11/12/2016       Note: Above Lab results reviewed.  Recent Imaging Review  DG PAIN CLINIC C-ARM 1-60 MIN NO REPORT Fluoro was used, but no Radiologist interpretation will be provided.  Please refer to "NOTES" tab for provider progress note. Note: Reviewed        Physical Exam  General appearance: Well nourished, well developed, and well hydrated. In no apparent acute distress Mental status: Alert, oriented x 3 (person, place, & time)       Respiratory: No evidence of acute respiratory distress Eyes: PERLA Vitals: BP 129/85   Pulse 68   Temp  (!) 97.2 F (36.2 C)   Ht '5\' 7"'  (1.702 m)   Wt 184 lb (83.5 kg)   SpO2 100%   BMI 28.82 kg/m  BMI: Estimated body mass index is 28.82 kg/m as calculated from the following:   Height as of this encounter: '5\' 7"'  (1.702 m).   Weight as of this encounter: 184 lb (83.5 kg). Ideal: Ideal body weight: 66.1 kg (145 lb 11.6 oz) Adjusted ideal body weight: 73 kg (161 lb 0.6 oz)  Assessment   Status Diagnosis  Controlled Controlled Controlled 1. Chronic pain syndrome   2. Chronic low back pain (1ry area of Pain) (Bilateral) (L>R) w/o sciatica    3. Chronic lower extremity pain (2ry area of Pain) (Left)   4. Chronic neck pain (3ry area of Pain) (Bilateral) (L>R)   5. Chronic upper back pain (4th area of Pain) (Bilateral) (L>R)   6. Pharmacologic therapy   7. Chronic use of opiate for therapeutic purpose   8. Opiate use (5 MME/Day)   9. Lumbar facet syndrome (Bilateral) (R>L)      Updated Problems: No problems updated.  Plan of Care  Problem-specific:  No problem-specific Assessment & Plan notes found for this encounter.  Jose Washington has a current medication list which includes the following long-term medication(s): atenolol, atorvastatin, baclofen, calcium carbonate, diphenhydramine hcl, duloxetine, fenofibrate, [START ON 03/06/2021] hydrocodone-acetaminophen, [START ON 04/05/2021] hydrocodone-acetaminophen, levalbuterol, omeprazole, potassium, simethicone, and sucralfate.  Pharmacotherapy (Medications Ordered): Meds ordered this encounter  Medications  . HYDROcodone-acetaminophen (NORCO/VICODIN) 5-325 MG tablet    Sig: Take 1 tablet by mouth daily as needed for severe pain. Must last 30 days.    Dispense:  30 tablet    Refill:  0    Not a duplicate. Do NOT delete! Chronic Pain: STOP Act NOT applicable. Fill 1 day early if closed on refill date. Avoid benzodiazepines within 8 hours of opioids. Do not send refill requests.  Marland Kitchen HYDROcodone-acetaminophen (NORCO/VICODIN) 5-325 MG  tablet    Sig: Take 1 tablet by mouth daily as needed for severe pain. Must last 30 days.    Dispense:  30 tablet    Refill:  0    Not a duplicate. Do NOT delete! Chronic Pain: STOP Act NOT applicable. Fill 1 day early if closed on refill date. Avoid benzodiazepines within 8 hours of opioids. Do not send refill requests.   Orders:  Orders Placed This Encounter  Procedures  . LUMBAR FACET(MEDIAL BRANCH NERVE BLOCK) MBNB    Standing Status:   Standing    Number of Occurrences:   1    Standing Expiration Date:   08/04/2021    Scheduling Instructions:     Procedure: Lumbar facet block (AKA.: Lumbosacral medial branch nerve block)  Type: Therapeutic     Side: Bilateral     Level: L3-4, L4-5, & L5-S1 Facets (L2, L3, L4, L5, & S1 Medial Branch Nerves)     Sedation: Patient's choice.     Timeframe: PRN    Order Specific Question:   Where will this procedure be performed?    Answer:   [facility]   Follow-up plan:   Return in about 3 months (around 05/05/2021) for (F2F), (MM) + PRN Therapeutic  (B) L-FCT BLK.      Interventional Therapies  Risk  Complexity Considerations:   WNL   Planned  Pending:   Abnormal UDS (03/27/2020) & (08/20/20) (+) undisclosed carboxy-THC    Under consideration:   Possible left lumbar facet RFA  Diagnostic left cervical facetblock  Possible left cervical facet RFA  Diagnostic bilateral sacroiliac joint block Possible bilateral sacroiliac joint RFA Diagnostic trigger point injections   Completed:   Diagnostic/therapeutic right IA hip injection x1 (12/09/2020)  Palliative right lumbar facet RFA x1 (11/22/2019) Palliative right lumbar facetMBB x2  Diagnostic/therapeutic left lumbar facetMBB x1  Diagnostic/therapeutic left L4-5LESIx2 Palliative left CESIx3 Diagnostic right cervical facet MBB x1 (03/13/2020) (100/100/100) Diagnostic left cervical facet MBB x1 (01/10/2020) (100/100/100)   Therapeutic  Palliative (PRN) options:    Palliative right lumbar facet RFA #2  Palliative right lumbar facetblock #3  Diagnostic/therapeutic left lumbar facetblock #2  Diagnostic/therapeutic left L4-5LESI#3 Palliative left CESI#4 Diagnostic right-sided cervical facet block #2  Diagnostic left-sided cervical facet block #2      Recent Visits Date Type Provider Dept  12/30/20 Office Visit Milinda Pointer, MD [facility]  12/09/20 Procedure visit Milinda Pointer, MD [facility]  11/26/20 Office Visit Milinda Pointer, MD [facility]  Showing recent visits within past 90 days and meeting all other requirements Today's Visits Date Type Provider Dept  02/02/21 Office Visit Milinda Pointer, MD [facility]  Showing today's visits and meeting all other requirements Future Appointments Date Type Provider Dept  04/29/21 Appointment Milinda Pointer, MD [facility]  Showing future appointments within next 90 days and meeting all other requirements  I discussed the assessment and treatment plan with the patient. The patient was provided an opportunity to ask questions and all were answered. The patient agreed with the plan and demonstrated an understanding of the instructions.  Patient advised to call back or seek an in-person evaluation if the symptoms or condition worsens.  Duration of encounter: 30 minutes.  Note by: Jose Cola, MD Date: 02/02/2021; Time: 3:12 PM

## 2021-02-02 ENCOUNTER — Other Ambulatory Visit: Payer: Self-pay

## 2021-02-02 ENCOUNTER — Ambulatory Visit: Payer: 59 | Attending: Pain Medicine | Admitting: Pain Medicine

## 2021-02-02 ENCOUNTER — Encounter: Payer: Self-pay | Admitting: Pain Medicine

## 2021-02-02 VITALS — BP 129/85 | HR 68 | Temp 97.2°F | Ht 67.0 in | Wt 184.0 lb

## 2021-02-02 DIAGNOSIS — M542 Cervicalgia: Secondary | ICD-10-CM | POA: Diagnosis not present

## 2021-02-02 DIAGNOSIS — M545 Low back pain, unspecified: Secondary | ICD-10-CM | POA: Diagnosis not present

## 2021-02-02 DIAGNOSIS — M549 Dorsalgia, unspecified: Secondary | ICD-10-CM

## 2021-02-02 DIAGNOSIS — Z79899 Other long term (current) drug therapy: Secondary | ICD-10-CM | POA: Diagnosis not present

## 2021-02-02 DIAGNOSIS — Z79891 Long term (current) use of opiate analgesic: Secondary | ICD-10-CM

## 2021-02-02 DIAGNOSIS — F1190 Opioid use, unspecified, uncomplicated: Secondary | ICD-10-CM

## 2021-02-02 DIAGNOSIS — G8929 Other chronic pain: Secondary | ICD-10-CM | POA: Diagnosis not present

## 2021-02-02 DIAGNOSIS — G894 Chronic pain syndrome: Secondary | ICD-10-CM | POA: Diagnosis not present

## 2021-02-02 DIAGNOSIS — M47816 Spondylosis without myelopathy or radiculopathy, lumbar region: Secondary | ICD-10-CM

## 2021-02-02 DIAGNOSIS — R69 Illness, unspecified: Secondary | ICD-10-CM | POA: Diagnosis not present

## 2021-02-02 MED ORDER — HYDROCODONE-ACETAMINOPHEN 5-325 MG PO TABS: 1.0000 | ORAL_TABLET | Freq: Every day | ORAL | 0 refills | Status: DC | PRN

## 2021-02-02 NOTE — Progress Notes (Signed)
  Safety precautions to be maintained throughout the outpatient stay will include: orient to surroundings, keep bed in low position, maintain call bell within reach at all times, provide assistance with transfer out of bed and ambulation. Nursing Pain Medication Assessment:  Safety precautions to be maintained throughout the outpatient stay will include: orient to surroundings, keep bed in low position, maintain call bell within reach at all times, provide assistance with transfer out of bed and ambulation.  Medication Inspection Compliance: Pill count conducted under aseptic conditions, in front of the patient. Neither the pills nor the bottle was removed from the patient's sight at any time. Once count was completed pills were immediately returned to the patient in their original bottle.  Medication #1: Hydrocodone/APAP Pill/Patch Count: 2 of 30 pills remain Pill/Patch Appearance: Markings consistent with prescribed medication Bottle Appearance: Standard pharmacy container. Clearly labeled. Filled Date: 3 / 9 / 22 Last Medication intake:  Today  Medication #2: Hydrocodone/APAP Pill/Patch Count: 30 of 30 pills remain Pill/Patch Appearance: Markings consistent with prescribed medication Bottle Appearance: Standard pharmacy container. Clearly labeled. Filled Date: 3 / 88 / 22 Last Medication intake:  have not use this bottle yet

## 2021-02-02 NOTE — Patient Instructions (Addendum)
____________________________________________________________________________________________  Medication Recommendations and Reminders  Applies to: All patients receiving prescriptions (written and/or electronic).  Medication Rules & Regulations: These rules and regulations exist for your safety and that of others. They are not flexible and neither are we. Dismissing or ignoring them will be considered "non-compliance" with medication therapy, resulting in complete and irreversible termination of such therapy. (See document titled "Medication Rules" for more details.) In all conscience, because of safety reasons, we cannot continue providing a therapy where the patient does not follow instructions.  Pharmacy of record:   Definition: This is the pharmacy where your electronic prescriptions will be sent.   We do not endorse any particular pharmacy, however, we have experienced problems with [facility] not securing enough medication supply for the community.  We do not restrict you in your choice of pharmacy. However, once we write for your prescriptions, we will NOT be re-sending more prescriptions to fix restricted supply problems created by your pharmacy, or your insurance.   The pharmacy listed in the electronic medical record should be the one where you want electronic prescriptions to be sent.  If you choose to change pharmacy, simply notify our nursing staff.  Recommendations:  Keep all of your pain medications in a safe place, under lock and key, even if you live alone. We will NOT replace lost, stolen, or damaged medication.  After you fill your prescription, take 1 week's worth of pills and put them away in a safe place. You should keep a separate, properly labeled bottle for this purpose. The remainder should be kept in the original bottle. Use this as your primary supply, until it runs out. Once it's gone, then you know that you have 1 week's worth of medicine, and it is time to come  in for a prescription refill. If you do this correctly, it is unlikely that you will ever run out of medicine.  To make sure that the above recommendation works, it is very important that you make sure your medication refill appointments are scheduled at least 1 week before you run out of medicine. To do this in an effective manner, make sure that you do not leave the office without scheduling your next medication management appointment. Always ask the nursing staff to show you in your prescription , when your medication will be running out. Then arrange for the receptionist to get you a return appointment, at least 7 days before you run out of medicine. Do not wait until you have 1 or 2 pills left, to come in. This is very poor planning and does not take into consideration that we may need to cancel appointments due to bad weather, sickness, or emergencies affecting our staff.  DO NOT ACCEPT A "Partial Fill": If for any reason your pharmacy does not have enough pills/tablets to completely fill or refill your prescription, do not allow for a "partial fill". The law allows the pharmacy to complete that prescription within 72 hours, without requiring a new prescription. If they do not fill the rest of your prescription within those 72 hours, you will need a separate prescription to fill the remaining amount, which we will NOT provide. If the reason for the partial fill is your insurance, you will need to talk to the pharmacist about payment alternatives for the remaining tablets, but again, DO NOT ACCEPT A PARTIAL FILL, unless you can trust your pharmacist to obtain the remainder of the pills within 72 hours.  Prescription refills and/or changes in medication(s):     Prescription refills, and/or changes in dose or medication, will be conducted only during scheduled medication management appointments. (Applies to both, written and electronic prescriptions.)  No refills on procedure days. No medication will be  changed or started on procedure days. No changes, adjustments, and/or refills will be conducted on a procedure day. Doing so will interfere with the diagnostic portion of the procedure.  No phone refills. No medications will be "called into the pharmacy".  No Fax refills.  No weekend refills.  No Holliday refills.  No after hours refills.  Remember:  Business hours are:  Monday to Thursday 8:00 AM to 4:00 PM Provider's Schedule: Milinda Pointer, MD - Appointments are:  Medication management: Monday and Wednesday 8:00 AM to 4:00 PM Procedure day: Tuesday and Thursday 7:30 AM to 4:00 PM Gillis Santa, MD - Appointments are:  Medication management: Tuesday and Thursday 8:00 AM to 4:00 PM Procedure day: Monday and Wednesday 7:30 AM to 4:00 PM (Last update: 05/21/2020) ____________________________________________________________________________________________   ____________________________________________________________________________________________  CBD (cannabidiol) WARNING  Applicable to: All individuals currently taking or considering taking CBD (cannabidiol) and, more important, all patients taking opioid analgesic controlled substances (pain medication). (Example: oxycodone; oxymorphone; hydrocodone; hydromorphone; morphine; methadone; tramadol; tapentadol; fentanyl; buprenorphine; butorphanol; dextromethorphan; meperidine; codeine; etc.)  Legal status: CBD remains a Schedule I drug prohibited for any use. CBD is illegal with one exception. In the [place], CBD has a limited Transport planner (FDA) approval for the treatment of two specific types of epilepsy disorders. Only one CBD product has been approved by the FDA for this purpose: "Epidiolex". FDA is aware that some companies are marketing products containing cannabis and cannabis-derived compounds in ways that violate the [employer], Drug and Cosmetic Act [facility] Act) and that may put the health and safety  of consumers at risk. The FDA, a Federal agency, has not enforced the CBD status since 2018.   Legality: Some manufacturers ship CBD products nationally, which is illegal. Often such products are sold online and are therefore available throughout the country. CBD is openly sold in head shops and health food stores in some states where such sales have not been explicitly legalized. Selling unapproved products with unsubstantiated therapeutic claims is not only a violation of the law, but also can put patients at risk, as these products have not been proven to be safe or effective. Federal illegality makes it difficult to conduct research on CBD.  Reference: "FDA Regulation of Cannabis and Cannabis-Derived Products, Including Cannabidiol (CBD)" - [employer]  Warning: CBD is not FDA approved and has not undergo the same manufacturing controls as prescription drugs.  This means that the purity and safety of available CBD may be questionable. Most of the time, despite manufacturer's claims, it is contaminated with THC (delta-9-tetrahydrocannabinol - the chemical in marijuana responsible for the "HIGH").  When this is the case, the [facility] contaminant will trigger a positive urine drug screen (UDS) test for Marijuana (carboxy-THC). Because a positive UDS for any illicit substance is a violation of our medication agreement, your opioid analgesics (pain medicine) may be permanently discontinued.  MORE ABOUT CBD  General Information: CBD  is a derivative of the Marijuana (cannabis sativa) plant discovered in 25. It is one of the 113 identified substances found in Marijuana. It accounts for up to 40% of the plant's extract. As of 2018, preliminary clinical studies on CBD included research for the treatment of anxiety, movement disorders, and pain. CBD is available and consumed in multiple forms,  including  inhalation of smoke or vapor, as an aerosol spray, and by mouth. It may be supplied as an oil containing CBD, capsules, dried cannabis, or as a liquid solution. CBD is thought not to be as psychoactive as THC (delta-9-tetrahydrocannabinol - the chemical in marijuana responsible for the "HIGH"). Studies suggest that CBD may interact with different biological target receptors in the body, including cannabinoid and other neurotransmitter receptors. As of 2018 the mechanism of action for its biological effects has not been determined.  Side-effects  Adverse reactions: Dry mouth, diarrhea, decreased appetite, fatigue, drowsiness, malaise, weakness, sleep disturbances, and others.  Drug interactions: CBC may interact with other medications such as blood-thinners. (Last update: 06/07/2020) ____________________________________________________________________________________________   ____________________________________________________________________________________________  Medication Rules  Purpose: To inform patients, and their family members, of our rules and regulations.  Applies to: All patients receiving prescriptions (written or electronic).  Pharmacy of record: Pharmacy where electronic prescriptions will be sent. If written prescriptions are taken to a different pharmacy, please inform the nursing staff. The pharmacy listed in the electronic medical record should be the one where you would like electronic prescriptions to be sent.  Electronic prescriptions: In compliance with the [place] Strengthen Opioid Misuse Prevention (STOP) Act of 2017 (Session Law 2017-74/H243), effective November 01, 2018, all controlled substances must be electronically prescribed. Calling prescriptions to the pharmacy will cease to exist.  Prescription refills: Only during scheduled appointments. Applies to all prescriptions.  NOTE: The following applies primarily to controlled substances (Opioid*  Pain Medications).   Type of encounter (visit): For patients receiving controlled substances, face-to-face visits are required. (Not an option or up to the patient.)  Patient's responsibilities: 1. Pain Pills: Bring all pain pills to every appointment (except for procedure appointments). 2. Pill Bottles: Bring pills in original pharmacy bottle. Always bring the newest bottle. Bring bottle, even if empty. 3. Medication refills: You are responsible for knowing and keeping track of what medications you take and those you need refilled. The day before your appointment: write a list of all prescriptions that need to be refilled. The day of the appointment: give the list to the admitting nurse. Prescriptions will be written only during appointments. No prescriptions will be written on procedure days. If you forget a medication: it will not be "Called in", "Faxed", or "electronically sent". You will need to get another appointment to get these prescribed. No early refills. Do not call asking to have your prescription filled early. 4. Prescription Accuracy: You are responsible for carefully inspecting your prescriptions before leaving our office. Have the discharge nurse carefully go over each prescription with you, before taking them home. Make sure that your name is accurately spelled, that your address is correct. Check the name and dose of your medication to make sure it is accurate. Check the number of pills, and the written instructions to make sure they are clear and accurate. Make sure that you are given enough medication to last until your next medication refill appointment. 5. Taking Medication: Take medication as prescribed. When it comes to controlled substances, taking less pills or less frequently than prescribed is permitted and encouraged. Never take more pills than instructed. Never take medication more frequently than prescribed.  6. Inform other Doctors: Always inform, all of your  healthcare providers, of all the medications you take. 7. Pain Medication from other Providers: You are not allowed to accept any additional pain medication from any other Doctor or Healthcare provider. There are two exceptions to this rule. (see below) In   the event that you require additional pain medication, you are responsible for notifying us, as stated below. 8. Cough Medicine: Often these contain an opioid, such as codeine or hydrocodone. Never accept or take cough medicine containing these opioids if you are already taking an opioid* medication. The combination may cause respiratory failure and death. 9. Medication Agreement: You are responsible for carefully reading and following our Medication Agreement. This must be signed before receiving any prescriptions from our practice. Safely store a copy of your signed Agreement. Violations to the Agreement will result in no further prescriptions. (Additional copies of our Medication Agreement are available upon request.) 10. Laws, Rules, & Regulations: All patients are expected to follow all Federal and [employer], [employer], Rules, [employer]. Ignorance of the Laws does not constitute a valid excuse.  11. Illegal drugs and Controlled Substances: The use of illegal substances (including, but not limited to marijuana and its derivatives) and/or the illegal use of any controlled substances is strictly prohibited. Violation of this rule may result in the immediate and permanent discontinuation of any and all prescriptions being written by our practice. The use of any illegal substances is prohibited. 12. Adopted CDC guidelines & recommendations: Target dosing levels will be at or below 60 MME/day. Use of benzodiazepines** is not recommended.  Exceptions: There are only two exceptions to the rule of not receiving pain medications from other Healthcare Providers. 1. Exception #1 (Emergencies): In the event of an emergency (i.e.: accident requiring emergency  care), you are allowed to receive additional pain medication. However, you are responsible for: As soon as you are able, call our office [phone], at any time of the day or night, and leave a message stating your name, the date and nature of the emergency, and the name and dose of the medication prescribed. In the event that your call is answered by a member of our staff, make sure to document and save the date, time, and the name of the person that took your information.  2. Exception #2 (Planned Surgery): In the event that you are scheduled by another doctor or dentist to have any type of surgery or procedure, you are allowed (for a period no longer than 30 days), to receive additional pain medication, for the acute post-op pain. However, in this case, you are responsible for picking up a copy of our "Post-op Pain Management for Surgeons" handout, and giving it to your surgeon or dentist. This document is available at our office, and does not require an appointment to obtain it. Simply go to our office during business hours (Monday-Thursday from 8:00 AM to 4:00 PM) (Friday 8:00 AM to 12:00 Noon) or if you have a scheduled appointment with [place], prior to your surgery, and ask for it by name. In addition, you are responsible for: calling our office [phone], at any time of the day or night, and leaving a message stating your name, name of your surgeon, type of surgery, and date of procedure or surgery. Failure to comply with your responsibilities may result in termination of therapy involving the controlled substances.  *Opioid medications include: morphine, codeine, oxycodone, oxymorphone, hydrocodone, hydromorphone, meperidine, tramadol, tapentadol, buprenorphine, fentanyl, methadone. **Benzodiazepine medications include: diazepam (Valium), alprazolam (Xanax), clonazepam (Klonopine), lorazepam (Ativan), clorazepate (Tranxene), chlordiazepoxide (Librium), estazolam (Prosom), oxazepam (Serax),  temazepam (Restoril), triazolam (Halcion) (Last updated: 09/29/2020) ____________________________________________________________________________________________   ____________________________________________________________________________________________  Preparing for Procedure with Sedation  Procedure appointments are limited to planned procedures: . No Prescription Refills. . No disability issues  will be discussed. . No medication changes will be discussed.  Instructions: . Oral Intake: Do not eat or drink anything for at least 8 hours prior to your procedure. (Exception: Blood Pressure Medication. See below.) . Transportation: Unless otherwise stated by your physician, you may drive yourself after the procedure. . Blood Pressure Medicine: Do not forget to take your blood pressure medicine with a sip of water the morning of the procedure. If your Diastolic (lower reading)is above 100 mmHg, elective cases will be cancelled/rescheduled. . Blood thinners: These will need to be stopped for procedures. Notify our staff if you are taking any blood thinners. Depending on which one you take, there will be specific instructions on how and when to stop it. . Diabetics on insulin: Notify the staff so that you can be scheduled 1st case in the morning. If your diabetes requires high dose insulin, take only  of your normal insulin dose the morning of the procedure and notify the staff that you have done so. . Preventing infections: Shower with an antibacterial soap the morning of your procedure. . Build-up your immune system: Take 1000 mg of Vitamin C with every meal (3 times a day) the day prior to your procedure. Marland Kitchen Antibiotics: Inform the staff if you have a condition or reason that requires you to take antibiotics before dental procedures. . Pregnancy: If you are pregnant, call and cancel the procedure. . Sickness: If you have a cold, fever, or any active infections, call and cancel the  procedure. . Arrival: You must be in the facility at least 30 minutes prior to your scheduled procedure. . Children: Do not bring children with you. . Dress appropriately: Bring dark clothing that you would not mind if they get stained. . Valuables: Do not bring any jewelry or valuables.  Reasons to call and reschedule or cancel your procedure: (Following these recommendations will minimize the risk of a serious complication.) . Surgeries: Avoid having procedures within 2 weeks of any surgery. (Avoid for 2 weeks before or after any surgery). . Flu Shots: Avoid having procedures within 2 weeks of a flu shots or . (Avoid for 2 weeks before or after immunizations). . Barium: Avoid having a procedure within 7-10 days after having had a radiological study involving the use of radiological contrast. (Myelograms, Barium swallow or enema study). . Heart attacks: Avoid any elective procedures or surgeries for the initial 6 months after a "Myocardial Infarction" (Heart Attack). . Blood thinners: It is imperative that you stop these medications before procedures. Let us know if you if you take any blood thinner.  . Infection: Avoid procedures during or within two weeks of an infection (including chest colds or gastrointestinal problems). Symptoms associated with infections include: Localized redness, fever, chills, night sweats or profuse sweating, burning sensation when voiding, cough, congestion, stuffiness, runny nose, sore throat, diarrhea, nausea, vomiting, cold or Flu symptoms, recent or current infections. It is specially important if the infection is over the area that we intend to treat. Marland Kitchen Heart and lung problems: Symptoms that may suggest an active cardiopulmonary problem include: cough, chest pain, breathing difficulties or shortness of breath, dizziness, ankle swelling, uncontrolled high or unusually low blood pressure, and/or palpitations. If you are experiencing any of these symptoms, cancel your  procedure and contact your primary care physician for an evaluation.  Remember:  Regular Business hours are:  Monday to Thursday 8:00 AM to 4:00 PM  Provider's Schedule: Milinda Pointer, MD:  Procedure days: Tuesday and Thursday 7:30  AM to 4:00 PM  Gillis Santa, MD:  Procedure days: Monday and Wednesday 7:30 AM to 4:00 PM ____________________________________________________________________________________________

## 2021-02-08 ENCOUNTER — Encounter: Payer: Self-pay | Admitting: Emergency Medicine

## 2021-02-08 ENCOUNTER — Emergency Department: Payer: 59

## 2021-02-08 ENCOUNTER — Other Ambulatory Visit: Payer: Self-pay

## 2021-02-08 ENCOUNTER — Emergency Department
Admission: EM | Admit: 2021-02-08 | Discharge: 2021-02-08 | Disposition: A | Discharge status: Home / Self Care | Payer: 59 | Attending: Emergency Medicine | Admitting: Emergency Medicine

## 2021-02-08 DIAGNOSIS — K8070 Calculus of gallbladder and bile duct without cholecystitis without obstruction: Secondary | ICD-10-CM | POA: Diagnosis not present

## 2021-02-08 DIAGNOSIS — K429 Umbilical hernia without obstruction or gangrene: Secondary | ICD-10-CM | POA: Diagnosis not present

## 2021-02-08 DIAGNOSIS — R1013 Epigastric pain: Secondary | ICD-10-CM | POA: Diagnosis present

## 2021-02-08 DIAGNOSIS — I10 Essential (primary) hypertension: Secondary | ICD-10-CM | POA: Diagnosis not present

## 2021-02-08 DIAGNOSIS — K2900 Acute gastritis without bleeding: Secondary | ICD-10-CM

## 2021-02-08 DIAGNOSIS — Z8711 Personal history of peptic ulcer disease: Secondary | ICD-10-CM | POA: Diagnosis not present

## 2021-02-08 DIAGNOSIS — K4090 Unilateral inguinal hernia, without obstruction or gangrene, not specified as recurrent: Secondary | ICD-10-CM | POA: Diagnosis not present

## 2021-02-08 LAB — CBC
HCT: 42.5 % (ref 39.0–52.0)
Hemoglobin: 15.1 g/dL (ref 13.0–17.0)
MCH: 30.6 pg (ref 26.0–34.0)
MCHC: 35.5 g/dL (ref 30.0–36.0)
MCV: 86 fL (ref 80.0–100.0)
Platelets: 340 10*3/uL (ref 150–400)
RBC: 4.94 MIL/uL (ref 4.22–5.81)
RDW: 12.7 % (ref 11.5–15.5)
WBC: 8.9 10*3/uL (ref 4.0–10.5)
nRBC: 0 % (ref 0.0–0.2)

## 2021-02-08 LAB — COMPREHENSIVE METABOLIC PANEL
ALT: 60 U/L — ABNORMAL HIGH (ref 0–44)
AST: 59 U/L — ABNORMAL HIGH (ref 15–41)
Albumin: 4.8 g/dL (ref 3.5–5.0)
Alkaline Phosphatase: 51 U/L (ref 38–126)
Anion gap: 8 (ref 5–15)
BUN: 13 mg/dL (ref 6–20)
CO2: 24 mmol/L (ref 22–32)
Calcium: 9.7 mg/dL (ref 8.9–10.3)
Chloride: 106 mmol/L (ref 98–111)
Creatinine, Ser: 1.11 mg/dL (ref 0.61–1.24)
GFR, Estimated: >60 mL/min (ref >60)
Glucose, Bld: 126 mg/dL — ABNORMAL HIGH (ref 70–99)
Potassium: 3.7 mmol/L (ref 3.5–5.1)
Sodium: 138 mmol/L (ref 135–145)
Total Bilirubin: 0.7 mg/dL (ref 0.3–1.2)
Total Protein: 7.4 g/dL (ref 6.5–8.1)

## 2021-02-08 LAB — URINALYSIS, COMPLETE (UACMP) WITH MICROSCOPIC
Bacteria, UA: NONE SEEN
Bilirubin Urine: NEGATIVE
Glucose, UA: NEGATIVE mg/dL
Hgb urine dipstick: NEGATIVE
Ketones, ur: NEGATIVE mg/dL
Leukocytes,Ua: NEGATIVE
Nitrite: NEGATIVE
Protein, ur: NEGATIVE mg/dL
Specific Gravity, Urine: 1.017 (ref 1.005–1.030)
Squamous Epithelial / HPF: NONE SEEN (ref 0–5)
WBC, UA: NONE SEEN WBC/hpf (ref 0–5)
pH: 7 (ref 5.0–8.0)

## 2021-02-08 LAB — LIPASE, BLOOD: Lipase: 28 U/L (ref 11–51)

## 2021-02-08 MED ORDER — ALUM & MAG HYDROXIDE-SIMETH 200-200-20 MG/5ML PO SUSP
30.0000 mL | Freq: Once | ORAL | Status: AC
  Administered 2021-02-08: 30 mL via ORAL
  Filled 2021-02-08: qty 30

## 2021-02-08 MED ORDER — PANTOPRAZOLE SODIUM 40 MG PO TBEC: 40.0000 mg | DELAYED_RELEASE_TABLET | Freq: Every day | ORAL | 6 refills | Status: DC

## 2021-02-08 MED ORDER — LIDOCAINE VISCOUS HCL 2 % MT SOLN
15.0000 mL | Freq: Once | OROMUCOSAL | Status: AC
  Administered 2021-02-08: 15 mL via ORAL
  Filled 2021-02-08: qty 15

## 2021-02-08 MED ORDER — PANTOPRAZOLE SODIUM 40 MG PO TBEC
40.0000 mg | DELAYED_RELEASE_TABLET | Freq: Once | ORAL | Status: AC
  Administered 2021-02-08: 40 mg via ORAL
  Filled 2021-02-08: qty 1

## 2021-02-08 MED ORDER — IOHEXOL 300 MG/ML  SOLN
100.0000 mL | Freq: Once | INTRAMUSCULAR | Status: AC | PRN
  Administered 2021-02-08: 100 mL via INTRAVENOUS

## 2021-02-08 MED ORDER — LIDOCAINE VISCOUS HCL 2 % MT SOLN: 15.0000 mL | Freq: Two times a day (BID) | OROMUCOSAL | 2 refills | Status: DC | PRN

## 2021-02-08 NOTE — ED Triage Notes (Signed)
Pt to ED via POV c/o abdominal pain for 1 month that has continued to get worse. Pt states that he has been having weakness and feeling like he was going to faint as well. Pt states that he has hx/o stomach ulcers and had 1 removed in 2010. Pt is in NAD.

## 2021-02-08 NOTE — ED Provider Notes (Signed)
[facility] Emergency Department Provider Note  ____________________________________________  Time seen: Approximately 6:16 PM  I have reviewed the triage vital signs and the nursing notes.   HISTORY  Chief Complaint Abdominal Pain    HPI Jose Washington is a 55 y.o. male who presents the emergency department complaining of epigastric abdominal pain.  Patient has a history of a duodenal ulcer with perforation and hemorrhage.  Patient states that he has been well managed on Carafate, omeprazole and watching his diet for the past 10 years.  Patient states that he developed some increasing epigastric pain roughly a month ago.  Symptoms are worsening over the last 2 to 3 days so he presents for evaluation.  Patient denied any emesis, diarrhea or constipation.  Patient denies any dysuria, polyuria or hematuria.  No chest pain or shortness of breath.  Patient states that he has been taking his meds as prescribed.  Further medical history as described below.         Past Medical History:  Diagnosis Date  . Allergy   . Anxiety   . Chronic duodenal ulcer with hemorrhage 2012  . Chronic neck pain   . Depression   . Hyperlipidemia   . Hypertension   . Microscopic hematuria     Patient Active Problem List   Diagnosis Date Noted  . Aortic atherosclerosis ([place]) 01/20/2021  . History of bleeding peptic ulcer 12/24/2020  . Right inguinal hernia 12/20/2020  . Chronic hip pain (Right) 12/09/2020  . Enthesopathy of hip region (Right) 12/09/2020  . Osteoarthritis of hip (Right) 12/09/2020  . History of illicit drug use 75/64/3329  . History of marijuana use 11/26/2020  . Abnormal result on screening urine test (03/27/20 & 08/20/20) 11/26/2020  . Chronic hip pain (Bilateral) 11/26/2020  . GERD (gastroesophageal reflux disease) 06/20/2020  . Myofascial pain syndrome of thoracic spine (serratus muscle) (Right) 05/14/2020  . Chronic flank pain (Right) 05/14/2020  .  Pharmacologic therapy 03/26/2020  . Spondylosis without myelopathy or radiculopathy, cervical region 01/10/2020  . Cervicalgia (Bilateral) (L>R) 12/05/2019  . Anxiety   . Chronic musculoskeletal pain 05/16/2019  . Abnormal MRI, lumbar spine (09/07/2017) 05/16/2019  . Inflammatory spondylopathy of lumbosacral region ([place]) 03/07/2019  . Chronic lower extremity pain (Right) 03/07/2019  . Spondylosis without myelopathy or radiculopathy, lumbosacral region 12/28/2018  . DDD (degenerative disc disease), lumbosacral 07/25/2018  . Lumbar facet hypertrophy (Multilevel) (Bilateral) 07/25/2018  . Chronic right shoulder pain 11/30/2017  . Suprascapular neuropathy, right 11/30/2017  . Chronic knee pain (Left) 08/25/2017  . Chronic lower extremity pain (2ry area of Pain) (Left) 08/25/2017  . Grade 1 Retrolisthesis of L3 over L4 08/25/2017  . Lumbar facet arthropathy (Bilateral) 08/25/2017  . Lumbar facet osteoarthritis 08/25/2017  . Lumbar facet syndrome (Bilateral) (R>L) 08/25/2017  . Lumbar spondylosis 08/25/2017  . Major depression in remission ([place]) 08/22/2017  . Cervical facet syndrome (Bilateral) (L>R) 03/23/2017  . Cervical spondylosis 03/23/2017  . Musculoskeletal neck pain (trapezius) (Left) 03/23/2017  . Vitamin D insufficiency 02/08/2017  . Cervical DDD (C4-5, C5-6, C6-7 and C7-T1) 01/27/2017  . Cervical foraminal stenosis (Bilateral: C5-6 & C6-7, Left: C4-5 & C7-T1) 01/27/2017  . Cervical radiculitis (Bilateral) (L>R) 01/27/2017  . Muscle spasticity 01/04/2017  . Chronic pain syndrome 11/03/2016  . Chronic low back pain (1ry area of Pain) (Bilateral) (L>R) w/o sciatica  11/03/2016  . Chronic upper back pain (4th area of Pain) (Bilateral) (L>R) 11/03/2016  . Chronic shoulder pain (Bilateral) (L>R) 11/03/2016  . Osteoarthritis  of AC (acromioclavicular) joint (Right) 11/03/2016  . Chronic sacroiliac joint pain (Bilateral) (L>R) 11/03/2016  . Long term current use of opiate analgesic  11/03/2016  . Long term prescription opiate use 11/03/2016  . Opiate use (5 MME/Day) 11/03/2016  . Chronic neck pain (3ry area of Pain) (Bilateral) (L>R) 04/09/2015  . Hyperlipidemia 04/09/2015  . Benign essential hypertension 04/09/2015    Past Surgical History:  Procedure Laterality Date  . bLEEDING ULCER  2012  . eye muscle repair  1972 and 1975    Prior to Admission medications   Medication Sig Start Date End Date Taking? Authorizing Provider  GI Cocktail (alum & mag hydroxide-simethicone/lidocaine)oral mixture Take 15 mLs by mouth 2 (two) times daily as needed. 02/08/21  Yes Tayden Nichelson, Charline Bills, PA-C  pantoprazole (PROTONIX) 40 MG tablet Take 1 tablet (40 mg total) by mouth daily. 02/08/21 02/08/22 Yes Lubertha Leite, Charline Bills, PA-C  acetaminophen (TYLENOL) 500 MG tablet Take 1 tablet (500 mg total) by mouth every 6 (six) hours as needed. 10/16/19   Laban Emperor, PA-C  atenolol (TENORMIN) 50 MG tablet Take 1 tablet (50 mg total) by mouth daily. 12/24/20   Cannady, Henrine Screws T, NP  atorvastatin (LIPITOR) 40 MG tablet Take 1 tablet (40 mg total) by mouth daily. 12/24/20   Cannady, Henrine Screws T, NP  baclofen (LIORESAL) 10 MG tablet Take 1 tablet (10 mg total) by mouth at bedtime. 12/24/20   Cannady, Henrine Screws T, NP  busPIRone (BUSPAR) 30 MG tablet Take 1 tablet (30 mg total) by mouth 2 (two) times daily. 12/24/20   Marnee Guarneri T, NP  Calcium Carbonate (CALCIUM 600 PO) Take 600 mg by mouth daily.    [provider]  Cholecalciferol (VITAMIN D3) 1000 units CAPS Take 1 capsule by mouth daily.    [provider]  desloratadine-pseudoephedrine (CLARINEX-D 12-HOUR) 2.5-120 MG 12 hr tablet Take 1 tablet by mouth as needed.    [provider]  DiphenhydrAMINE HCl (DIPHEDRYL ALLERGY PO) Take 25 mg by mouth daily.    [provider]  DULoxetine (CYMBALTA) 30 MG capsule Take 1 capsule (30 mg total) by mouth 2 (two) times daily. 12/24/20   Cannady, Henrine Screws T, NP  ELDERBERRY PO  Take 1,000 mg by mouth 3 (three) times a week.    [provider]  fenofibrate 160 MG tablet Take 1 tablet (160 mg total) by mouth daily. 12/24/20   Cannady, Henrine Screws T, NP  FLOWFLEX COVID-19 AG HOME TEST KIT FOLLOW INSTRUCTIONS INCLUDED WITH THE PACKAGE. 12/18/20   [provider]  fluticasone (FLONASE) 50 MCG/ACT nasal spray Place 2 sprays into both nostrils 2 (two) times daily.    [provider]  Glucosamine-Chondroit-Vit C-Mn (GLUCOSAMINE 1500 COMPLEX PO) Take by mouth 2 (two) times daily.    [provider]  HYDROcodone-acetaminophen (NORCO/VICODIN) 5-325 MG tablet Take 1 tablet by mouth daily as needed for severe pain. Must last 30 days. 03/06/21 04/05/21  Milinda Pointer, MD  HYDROcodone-acetaminophen (NORCO/VICODIN) 5-325 MG tablet Take 1 tablet by mouth daily as needed for severe pain. Must last 30 days. 04/05/21 05/05/21  Milinda Pointer, MD  hydrOXYzine (ATARAX/VISTARIL) 25 MG tablet Take 1 tablet (25 mg total) by mouth 3 (three) times daily as needed. 12/24/20   Cannady, Henrine Screws T, NP  ibuprofen (ADVIL,MOTRIN) 200 MG tablet Take 200 mg by mouth as needed (take 5 times a week).     [provider]  levalbuterol (XOPENEX HFA) 45 MCG/ACT inhaler Inhale 1 puff into the lungs every 6 (six) hours  as needed for wheezing. 12/24/20   Marnee Guarneri T, NP  magnesium oxide (MAG-OX) 400 MG tablet Take 400 mg by mouth daily.    [provider]  Melatonin 5 MG TABS Take by mouth at bedtime.     [provider]  Methylsulfonylmethane (MSM PO) Take by mouth daily.     [provider]  Multiple Vitamin (MULTIVITAMIN) tablet Take 1 tablet by mouth daily.    [provider]  omeprazole (PRILOSEC) 20 MG capsule Take 1 capsule (20 mg total) by mouth daily. 12/24/20   Cannady, Henrine Screws T, NP  ondansetron (ZOFRAN) 8 MG tablet Take 1 tablet (8 mg total) by mouth 2 (two) times daily. 12/24/20   Marnee Guarneri T, NP  Potassium 99 MG TABS Take 1  tablet by mouth.    [provider]  simethicone (MYLICON) 80 MG chewable tablet Chew 80 mg by mouth every 6 (six) hours as needed for flatulence.    [provider]  sucralfate (CARAFATE) 1 g tablet Take 1 tablet (1 g total) by mouth 2 (two) times daily. 12/24/20   Cannady, Henrine Screws T, NP  vitamin B-12 (CYANOCOBALAMIN) 1000 MCG tablet Take 1,000 mcg by mouth daily.    [provider]  vitamin E 180 MG (400 UNITS) capsule Take 400 Units by mouth daily.    [provider]    Allergies Patient has no known allergies.  Family History  Problem Relation Age of Onset  . Diabetes Mother   . Cancer Mother        breast- in remission  . Mental illness Father   . Depression Father   . Dementia Father        VASCULAR  . Allergies Brother   . Cancer Maternal Grandfather        colon  . Stroke Paternal Grandfather   . Dementia Paternal Grandfather        vascular    Social History Social History   Tobacco Use  . Smoking status: Never Smoker  . Smokeless tobacco: Never Used  Vaping Use  . Vaping Use: Never used  Substance Use Topics  . Alcohol use: No    Alcohol/week: 0.0 standard drinks  . Drug use: No     Review of Systems  Constitutional: No fever/chills Eyes: No visual changes. No discharge ENT: No upper respiratory complaints. Cardiovascular: no chest pain. Respiratory: no cough. No SOB. Gastrointestinal: Positive for epigastric abdominal pain.  No nausea, no vomiting.  No diarrhea.  No constipation. Genitourinary: Negative for dysuria. No hematuria Musculoskeletal: Negative for musculoskeletal pain. Skin: Negative for rash, abrasions, lacerations, ecchymosis. Neurological: Negative for headaches, focal weakness or numbness.  10 System ROS otherwise negative.  ____________________________________________   PHYSICAL EXAM:  VITAL SIGNS: ED Triage Vitals  Enc Vitals Group     BP 02/08/21 1735 (!) 140/111     Pulse Rate 02/08/21  1735 (!) 119     Resp 02/08/21 1735 18     Temp 02/08/21 1735 99.5 F (37.5 C)     Temp Source 02/08/21 1735 Oral     SpO2 02/08/21 1735 99 %     Weight 02/08/21 1736 184 lb (83.5 kg)     Height 02/08/21 1736 '5\' 7"'  (1.702 m)     Head Circumference --      Peak Flow --      Pain Score 02/08/21 1740 4     Pain Loc --      Pain Edu? --  Excl. in [place]? --      Constitutional: Alert and oriented. Well appearing and in no acute distress. Eyes: Conjunctivae are normal. PERRL. EOMI. Head: Atraumatic. ENT:      Ears:       Nose: No congestion/rhinnorhea.      Mouth/Throat: Mucous membranes are moist.  Neck: No stridor.    Cardiovascular: Normal rate, regular rhythm. Normal S1 and S2.  Good peripheral circulation. Respiratory: Normal respiratory effort without tachypnea or retractions. Lungs CTAB. Good air entry to the bases with no decreased or absent breath sounds. Gastrointestinal: No external abdominal wall findings.  Bowel sounds 4 quadrants.  Soft to palpation all quadrants.  Patient is mostly nontender with only some mild tenderness in the epigastric region itself.  No right upper or left upper quadrant tenderness.  No lower quadrant tenderness.  No guarding or rigidity. No palpable masses. No distention. No CVA tenderness. Musculoskeletal: Full range of motion to all extremities. No gross deformities appreciated. Neurologic:  Normal speech and language. No gross focal neurologic deficits are appreciated.  Skin:  Skin is warm, dry and intact. No rash noted. Psychiatric: Mood and affect are normal. Speech and behavior are normal. Patient exhibits appropriate insight and judgement.   ____________________________________________   LABS (all labs ordered are listed, but only abnormal results are displayed)  Labs Reviewed  COMPREHENSIVE METABOLIC PANEL - Abnormal; Notable for the following components:      Result Value   Glucose, Bld 126 (*)    AST 59 (*)    ALT 60 (*)    All  other components within normal limits  URINALYSIS, COMPLETE (UACMP) WITH MICROSCOPIC - Abnormal; Notable for the following components:   Color, Urine YELLOW (*)    APPearance CLEAR (*)    All other components within normal limits  LIPASE, BLOOD  CBC   ____________________________________________  EKG   ____________________________________________  RADIOLOGY I personally viewed and evaluated these images as part of my medical decision making, as well as reviewing the written report by the radiologist.  ED Provider Interpretation: No acute findings on CT scan.  Fatty liver, cholelithiasis without evidence of cholecystitis.  Moderate stool ball burden but no obstruction.  No evidence of gastric perforation/free fluid in the abdomen.  CT ABDOMEN PELVIS W CONTRAST  Result Date: 02/08/2021 CLINICAL DATA:  55 year old male with epigastric pain. History of peptic ulcer. EXAM: CT ABDOMEN AND PELVIS WITH CONTRAST TECHNIQUE: Multidetector CT imaging of the abdomen and pelvis was performed using the standard protocol following bolus administration of intravenous contrast. CONTRAST:  169m OMNIPAQUE IOHEXOL 300 MG/ML  SOLN COMPARISON:  CT abdomen pelvis dated 08/14/2019. FINDINGS: Lower chest: The visualized lung bases are clear. No intra-abdominal free air or free fluid. Hepatobiliary: Fatty liver. No intrahepatic biliary dilatation. Multiple stomal stones noted in the gallbladder. No pericholecystic fluid or evidence of acute cholecystitis by CT. Pancreas: Unremarkable. No pancreatic ductal dilatation or surrounding inflammatory changes. Spleen: Normal in size without focal abnormality. Adrenals/Urinary Tract: The adrenal glands unremarkable. There is no hydronephrosis on either side. There is symmetric enhancement and excretion of contrast by both kidneys. The visualized ureters and urinary bladder appear unremarkable. Stomach/Bowel: There is moderate stool throughout the colon. There is no bowel  obstruction or active inflammation. The appendix is normal. Vascular/Lymphatic: Mild atherosclerotic calcification of the abdominal aorta. The IVC is unremarkable. No portal venous gas. There is no adenopathy. Reproductive: The prostate and seminal vesicles are grossly unremarkable. No pelvic mass. Other: Small fat containing right inguinal and  umbilical hernias. No bowel herniation or fluid collection. Musculoskeletal: Lower lumbar facet arthropathy. No acute osseous pathology. IMPRESSION: 1. No acute intra-abdominal or pelvic pathology. 2. Cholelithiasis. 3. Fatty liver. 4. Moderate colonic stool burden. No bowel obstruction. Normal appendix. 5. Aortic Atherosclerosis (ICD10-I70.0). Electronically Signed   By: Anner Crete M.D.   On: 02/08/2021 20:16    ____________________________________________    PROCEDURES  Procedure(s) performed:    Procedures    Medications  iohexol (OMNIPAQUE) 300 MG/ML solution 100 mL (100 mLs Intravenous Contrast Given 02/08/21 1952)  alum & mag hydroxide-simeth (MAALOX/MYLANTA) 200-200-20 MG/5ML suspension 30 mL (30 mLs Oral Given 02/08/21 2119)    And  lidocaine (XYLOCAINE) 2 % viscous mouth solution 15 mL (15 mLs Oral Given 02/08/21 2119)  pantoprazole (PROTONIX) EC tablet 40 mg (40 mg Oral Given 02/08/21 2119)     ____________________________________________   INITIAL IMPRESSION / ASSESSMENT AND PLAN / ED COURSE  Pertinent labs & imaging results that were available during my care of the patient were reviewed by me and considered in my medical decision making (see chart for details).  Review of the [place] CSRS was performed in accordance of the [place] prior to dispensing any controlled drugs.           Patient's diagnosis is consistent with gastritis.  Patient presented to the emergency department complaining of epigastric pain.  He has a history of gastric ulcer which 10 years ago had perforation with hemorrhaging into the abdomen.  Patient has been  well managed on Prilosec, Carafate for the last decade.  He started to develop some epigastric pain roughly a month ago.  No emesis, no diarrhea or constipation.  He is seen no blood.  Patient was mildly tender in the epigastric region but given his history and patient was evaluated with labs and imaging.  This is reassuring with no evidence of acute findings on imaging.  Patient remained stable throughout his stay in the emergency department.  We will try adjusting some of patient's medications to include a GI cocktail, switching his Prilosec to Protonix and having him follow-up with GI for a another endoscopy.  Patient is agreeable with this plan.  Return precautions discussed at length with the patient.  Strict return precautions are provided.  Patient is given ED precautions to return to the ED for any worsening or new symptoms.     ____________________________________________  FINAL CLINICAL IMPRESSION(S) / ED DIAGNOSES  Final diagnoses:  Acute gastritis without hemorrhage, unspecified gastritis type      NEW MEDICATIONS STARTED DURING THIS VISIT:  ED Discharge Orders         Ordered    pantoprazole (PROTONIX) 40 MG tablet  Daily        02/08/21 2108    GI Cocktail (alum & mag hydroxide-simethicone/lidocaine)oral mixture  2 times daily PRN        02/08/21 2108              This chart was dictated using voice recognition software/Dragon. Despite best efforts to proofread, errors can occur which can change the meaning. Any change was purely unintentional.    Darletta Moll, PA-C 02/08/21 2218    Lavonia Drafts, MD 02/08/21 [phone]

## 2021-02-08 NOTE — ED Notes (Addendum)
Report received from [facility]. Pt resting on stretcher in NAD. Provided warm blanket per request. Awaiting CT scan

## 2021-02-08 NOTE — ED Notes (Signed)
Pt back from CT at this time 

## 2021-03-11 ENCOUNTER — Other Ambulatory Visit: Payer: Self-pay

## 2021-03-11 ENCOUNTER — Encounter: Payer: Self-pay | Admitting: Gastroenterology

## 2021-03-11 ENCOUNTER — Ambulatory Visit (INDEPENDENT_AMBULATORY_CARE_PROVIDER_SITE_OTHER): Payer: 59 | Admitting: Gastroenterology

## 2021-03-11 VITALS — BP 92/53 | HR 59 | Temp 97.4°F | Ht 67.0 in | Wt 188.4 lb

## 2021-03-11 DIAGNOSIS — K219 Gastro-esophageal reflux disease without esophagitis: Secondary | ICD-10-CM | POA: Diagnosis not present

## 2021-03-11 NOTE — Progress Notes (Signed)
Gastroenterology Consultation  Referring Provider:     Venita Lick, NP Primary Care Physician:  Jon Billings, NP Primary Gastroenterologist:  Dr. Allen Norris     Reason for Consultation:     Abdominal pain        HPI:   DEANTE BLOUGH is a 55 y.o. y/o male referred for consultation & management of abdominal pain by Dr. Jon Billings, NP.  Patient comes in today with a history of being told he had ulcers in the past.  The patient has been treated with omeprazole and reports that he has been doing well until his visit to the ER.  He also reports that he has a history of gastritis.  The patient was switched to Protonix in the ER.  He had also reported that his past peptic ulcer disease resulted in perforation of duodenum.  When he was seen in the emergency department in April of this year had reported that his symptoms were worse over the 2 to 3 days prior to that visit but has been present for a month to a lesser extent.  There is no report of any black stools or bloody stools.  He also denies any unexplained weight loss fevers chills nausea or vomiting.  He states that after trying the Protonix into him in the ER he has decided that the omeprazole was working better for him.  Past Medical History:  Diagnosis Date  . Allergy   . Anxiety   . Chronic duodenal ulcer with hemorrhage 2012  . Chronic neck pain   . Depression   . Hyperlipidemia   . Hypertension   . Microscopic hematuria     Past Surgical History:  Procedure Laterality Date  . bLEEDING ULCER  2012  . eye muscle repair  1972 and 1975    Prior to Admission medications   Medication Sig Start Date End Date Taking? Authorizing Provider  acetaminophen (TYLENOL) 500 MG tablet Take 1 tablet (500 mg total) by mouth every 6 (six) hours as needed. 10/16/19  Yes Laban Emperor, PA-C  atenolol (TENORMIN) 50 MG tablet Take 1 tablet (50 mg total) by mouth daily. 12/24/20  Yes Cannady, Jolene T, NP  atorvastatin (LIPITOR) 40 MG  tablet Take 1 tablet (40 mg total) by mouth daily. 12/24/20  Yes Cannady, Jolene T, NP  baclofen (LIORESAL) 10 MG tablet Take 1 tablet (10 mg total) by mouth at bedtime. 12/24/20  Yes Cannady, Jolene T, NP  busPIRone (BUSPAR) 30 MG tablet Take 1 tablet (30 mg total) by mouth 2 (two) times daily. 12/24/20  Yes Cannady, Jolene T, NP  Calcium Carbonate (CALCIUM 600 PO) Take 600 mg by mouth daily.   Yes [provider]  Cholecalciferol (VITAMIN D3) 1000 units CAPS Take 1 capsule by mouth daily.   Yes [provider]  desloratadine-pseudoephedrine (CLARINEX-D 12-HOUR) 2.5-120 MG 12 hr tablet Take 1 tablet by mouth as needed.   Yes [provider]  DiphenhydrAMINE HCl (DIPHEDRYL ALLERGY PO) Take 25 mg by mouth daily.   Yes [provider]  DULoxetine (CYMBALTA) 30 MG capsule Take 1 capsule (30 mg total) by mouth 2 (two) times daily. 12/24/20  Yes Cannady, Jolene T, NP  ELDERBERRY PO Take 1,000 mg by mouth 3 (three) times a week.   Yes [provider]  fenofibrate 160 MG tablet Take 1 tablet (160 mg total) by mouth daily. 12/24/20  Yes Cannady, Barbaraann Faster, NP  FLOWFLEX COVID-19 AG HOME TEST KIT FOLLOW INSTRUCTIONS INCLUDED WITH  THE PACKAGE. 12/18/20  Yes [provider]  fluticasone (FLONASE) 50 MCG/ACT nasal spray Place 2 sprays into both nostrils 2 (two) times daily.   Yes [provider]  GI Cocktail (alum & mag hydroxide-simethicone/lidocaine)oral mixture Take 15 mLs by mouth 2 (two) times daily as needed. 02/08/21  Yes Cuthriell, Charline Bills, PA-C  Glucosamine-Chondroit-Vit C-Mn (GLUCOSAMINE 1500 COMPLEX PO) Take by mouth 2 (two) times daily.   Yes [provider]  HYDROcodone-acetaminophen (NORCO/VICODIN) 5-325 MG tablet Take 1 tablet by mouth daily as needed for severe pain. Must last 30 days. 03/06/21 04/05/21 Yes Milinda Pointer, MD  HYDROcodone-acetaminophen (NORCO/VICODIN) 5-325 MG tablet Take 1 tablet by mouth daily as needed for severe  pain. Must last 30 days. 04/05/21 05/05/21 Yes Milinda Pointer, MD  hydrOXYzine (ATARAX/VISTARIL) 25 MG tablet Take 1 tablet (25 mg total) by mouth 3 (three) times daily as needed. 12/24/20  Yes Cannady, Jolene T, NP  ibuprofen (ADVIL,MOTRIN) 200 MG tablet Take 200 mg by mouth as needed (take 5 times a week).    Yes [provider]  levalbuterol (XOPENEX HFA) 45 MCG/ACT inhaler Inhale 1 puff into the lungs every 6 (six) hours as needed for wheezing. 12/24/20  Yes Cannady, Jolene T, NP  magnesium oxide (MAG-OX) 400 MG tablet Take 400 mg by mouth daily.   Yes [provider]  Melatonin 5 MG TABS Take by mouth at bedtime.    Yes [provider]  Methylsulfonylmethane (MSM PO) Take by mouth daily.    Yes [provider]  Multiple Vitamin (MULTIVITAMIN) tablet Take 1 tablet by mouth daily.   Yes [provider]  omeprazole (PRILOSEC) 20 MG capsule Take 1 capsule (20 mg total) by mouth daily. 12/24/20  Yes Cannady, Jolene T, NP  ondansetron (ZOFRAN) 8 MG tablet Take 1 tablet (8 mg total) by mouth 2 (two) times daily. 12/24/20  Yes Cannady, Jolene T, NP  pantoprazole (PROTONIX) 40 MG tablet Take 1 tablet (40 mg total) by mouth daily. 02/08/21 02/08/22 Yes Cuthriell, Charline Bills, PA-C  Potassium 99 MG TABS Take 1 tablet by mouth.   Yes [provider]  simethicone (MYLICON) 80 MG chewable tablet Chew 80 mg by mouth every 6 (six) hours as needed for flatulence.   Yes [provider]  sucralfate (CARAFATE) 1 g tablet Take 1 tablet (1 g total) by mouth 2 (two) times daily. 12/24/20  Yes Cannady, Jolene T, NP  vitamin B-12 (CYANOCOBALAMIN) 1000 MCG tablet Take 1,000 mcg by mouth daily.   Yes [provider]  vitamin E 180 MG (400 UNITS) capsule Take 400 Units by mouth daily.   Yes [provider]    Family History  Problem Relation Age of Onset  . Diabetes Mother   . Cancer Mother        breast- in remission  . Mental illness Father    . Depression Father   . Dementia Father        VASCULAR  . Allergies Brother   . Cancer Maternal Grandfather        colon  . Stroke Paternal Grandfather   . Dementia Paternal Grandfather        vascular     Social History   Tobacco Use  . Smoking status: Never Smoker  . Smokeless tobacco: Never Used  Vaping Use  . Vaping Use: Never used  Substance Use Topics  . Alcohol use: No    Alcohol/week: 0.0 standard drinks  . Drug use: No  Allergies as of 03/11/2021  . (No Known Allergies)    Review of Systems:    All systems reviewed and negative except where noted in HPI.   Physical Exam:  BP (!) 92/53   Pulse (!) 59   Temp (!) 97.4 F (36.3 C) (Temporal)   Ht _0  (1.702 m)   Wt 188 lb 6.4 oz (85.5 kg)   BMI 29.51 kg/m  No LMP for male patient. General:   Alert,  Well-developed, well-nourished, pleasant and cooperative in NAD Head:  Normocephalic and atraumatic. Eyes:  Sclera clear, no icterus.   Conjunctiva pink. Ears:  Normal auditory acuity. Neck:  Supple; no masses or thyromegaly. Lungs:  Respirations even and unlabored.  Clear throughout to auscultation.   No wheezes, crackles, or rhonchi. No acute distress. Heart:  Regular rate and rhythm; no murmurs, clicks, rubs, or gallops. Abdomen:  Normal bowel sounds.  No bruits.  Soft, non-tender and non-distended without masses, hepatosplenomegaly or hernias noted.  No guarding or rebound tenderness.  Negative Carnett sign.   Rectal:  Deferred.  Pulses:  Normal pulses noted. Extremities:  No clubbing or edema.  No cyanosis. Neurologic:  Alert and oriented x3;  grossly normal neurologically. Skin:  Intact without significant lesions or rashes.  No jaundice. Lymph Nodes:  No significant cervical adenopathy. Psych:  Alert and cooperative. Normal mood and affect.  Imaging Studies: No results found.  Assessment and Plan:   JAVARIE CRISP is a 55 y.o. y/o male who comes in today with a history of peptic ulcer  disease and a reported perforation of the duodenum.  The patient is now doing well on his PPI but was in the ER due to the abdominal pain.  The patient also reports that he is in need of a screening colonoscopy.  The patient will be set up for a upper endoscopy to rule out any gastritis or peptic ulcer disease in addition to a screening colonoscopy.  The patient has been explained the plan and agrees with it.    Lucilla Lame, MD. Marval Regal    Note: This dictation was prepared with Dragon dictation along with smaller phrase technology. Any transcriptional errors that result from this process are unintentional.

## 2021-03-16 ENCOUNTER — Other Ambulatory Visit: Payer: Self-pay

## 2021-03-16 DIAGNOSIS — K219 Gastro-esophageal reflux disease without esophagitis: Secondary | ICD-10-CM

## 2021-03-16 DIAGNOSIS — Z1211 Encounter for screening for malignant neoplasm of colon: Secondary | ICD-10-CM

## 2021-03-16 MED ORDER — LIDOCAINE VISCOUS HCL 2 % MT SOLN: 15.0000 mL | Freq: Two times a day (BID) | OROMUCOSAL | 2 refills | Status: DC | PRN

## 2021-03-20 ENCOUNTER — Other Ambulatory Visit: Payer: Self-pay

## 2021-03-20 MED ORDER — SUTAB 1479-225-188 MG PO TABS: 376.0000 mg | ORAL_TABLET | ORAL | 0 refills | Status: DC

## 2021-03-23 ENCOUNTER — Encounter: Payer: Self-pay | Admitting: Gastroenterology

## 2021-03-24 ENCOUNTER — Encounter: Payer: Self-pay | Admitting: Gastroenterology

## 2021-03-24 ENCOUNTER — Ambulatory Visit: Payer: 59 | Admitting: Anesthesiology

## 2021-03-24 ENCOUNTER — Encounter
Admission: RE | Disposition: A | Discharge status: Home / Self Care | Payer: Self-pay | Source: Home / Self Care | Attending: Gastroenterology

## 2021-03-24 ENCOUNTER — Ambulatory Visit
Admission: RE | Admit: 2021-03-24 | Discharge: 2021-03-24 | Disposition: A | Discharge status: Home / Self Care | Payer: 59 | Attending: Gastroenterology | Admitting: Gastroenterology

## 2021-03-24 DIAGNOSIS — D125 Benign neoplasm of sigmoid colon: Secondary | ICD-10-CM | POA: Diagnosis not present

## 2021-03-24 DIAGNOSIS — K2970 Gastritis, unspecified, without bleeding: Secondary | ICD-10-CM | POA: Diagnosis not present

## 2021-03-24 DIAGNOSIS — K635 Polyp of colon: Secondary | ICD-10-CM

## 2021-03-24 DIAGNOSIS — D123 Benign neoplasm of transverse colon: Secondary | ICD-10-CM | POA: Diagnosis not present

## 2021-03-24 DIAGNOSIS — Z1211 Encounter for screening for malignant neoplasm of colon: Secondary | ICD-10-CM

## 2021-03-24 DIAGNOSIS — K269 Duodenal ulcer, unspecified as acute or chronic, without hemorrhage or perforation: Secondary | ICD-10-CM | POA: Diagnosis not present

## 2021-03-24 DIAGNOSIS — Z800 Family history of malignant neoplasm of digestive organs: Secondary | ICD-10-CM | POA: Diagnosis not present

## 2021-03-24 DIAGNOSIS — E785 Hyperlipidemia, unspecified: Secondary | ICD-10-CM | POA: Diagnosis not present

## 2021-03-24 DIAGNOSIS — I10 Essential (primary) hypertension: Secondary | ICD-10-CM | POA: Diagnosis not present

## 2021-03-24 DIAGNOSIS — R1013 Epigastric pain: Secondary | ICD-10-CM | POA: Diagnosis not present

## 2021-03-24 DIAGNOSIS — K449 Diaphragmatic hernia without obstruction or gangrene: Secondary | ICD-10-CM | POA: Insufficient documentation

## 2021-03-24 DIAGNOSIS — K648 Other hemorrhoids: Secondary | ICD-10-CM | POA: Insufficient documentation

## 2021-03-24 DIAGNOSIS — Z79899 Other long term (current) drug therapy: Secondary | ICD-10-CM | POA: Insufficient documentation

## 2021-03-24 DIAGNOSIS — K219 Gastro-esophageal reflux disease without esophagitis: Secondary | ICD-10-CM

## 2021-03-24 HISTORY — PX: COLONOSCOPY WITH PROPOFOL: SHX5780

## 2021-03-24 HISTORY — DX: Gastro-esophageal reflux disease without esophagitis: K21.9

## 2021-03-24 HISTORY — PX: ESOPHAGOGASTRODUODENOSCOPY (EGD) WITH PROPOFOL: SHX5813

## 2021-03-24 SURGERY — COLONOSCOPY WITH PROPOFOL
Anesthesia: General

## 2021-03-24 MED ORDER — GLYCOPYRROLATE 0.2 MG/ML IJ SOLN
INTRAMUSCULAR | Status: AC
  Filled 2021-03-24: qty 1

## 2021-03-24 MED ORDER — MIDAZOLAM HCL 2 MG/2ML IJ SOLN
INTRAMUSCULAR | Status: DC | PRN
  Administered 2021-03-24: 2 mg via INTRAVENOUS

## 2021-03-24 MED ORDER — SODIUM CHLORIDE 0.9 % IV SOLN
INTRAVENOUS | Status: DC
Start: 2021-03-24 — End: 2021-03-24

## 2021-03-24 MED ORDER — PROPOFOL 10 MG/ML IV BOLUS
INTRAVENOUS | Status: DC | PRN
  Administered 2021-03-24: 100 mg via INTRAVENOUS

## 2021-03-24 MED ORDER — PROPOFOL 500 MG/50ML IV EMUL
INTRAVENOUS | Status: DC | PRN
  Administered 2021-03-24: 120 ug/kg/min via INTRAVENOUS

## 2021-03-24 MED ORDER — PROPOFOL 500 MG/50ML IV EMUL
INTRAVENOUS | Status: AC
  Filled 2021-03-24: qty 50

## 2021-03-24 MED ORDER — MIDAZOLAM HCL 2 MG/2ML IJ SOLN
INTRAMUSCULAR | Status: AC
  Filled 2021-03-24: qty 2

## 2021-03-24 MED ORDER — OMEPRAZOLE 40 MG PO CPDR: 40.0000 mg | DELAYED_RELEASE_CAPSULE | Freq: Every day | ORAL | 11 refills | Status: DC

## 2021-03-24 NOTE — Anesthesia Preprocedure Evaluation (Addendum)
Anesthesia Evaluation  Patient identified by MRN, date of birth, ID band Patient awake    Reviewed: Allergy & Precautions, NPO status , Patient's Chart, lab work & pertinent test results, reviewed documented beta blocker date and time   Airway Mallampati: II  TM Distance: >3 FB Neck ROM: Full    Dental no notable dental hx.    Pulmonary neg pulmonary ROS,    Pulmonary exam normal        Cardiovascular hypertension, Pt. on home beta blockers and Pt. on medications negative cardio ROS Normal cardiovascular exam     Neuro/Psych PSYCHIATRIC DISORDERS Anxiety Depression  Neuromuscular disease    GI/Hepatic PUD, Bowel prep,GERD  Medicated,  Endo/Other  negative endocrine ROS  Renal/GU negative Renal ROS  negative genitourinary   Musculoskeletal  (+) Arthritis , Osteoarthritis,    Abdominal   Peds negative pediatric ROS (+)  Hematology negative hematology ROS (+)   Anesthesia Other Findings . Allergy  . Anxiety  . Chronic duodenal ulcer with hemorrhage 2012 . Chronic neck pain  . Depression  . Hyperlipidemia  . Hypertension  . Microscopic hematuria     Reproductive/Obstetrics negative OB ROS                           Anesthesia Physical Anesthesia Plan  ASA: II  Anesthesia Plan: General   Post-op Pain Management:    Induction: Intravenous  PONV Risk Score and Plan: 2 and Propofol infusion and TIVA  Airway Management Planned: Natural Airway and Nasal Cannula  Additional Equipment:   Intra-op Plan:   Post-operative Plan:   Informed Consent: I have reviewed the patients History and Physical, chart, labs and discussed the procedure including the risks, benefits and alternatives for the proposed anesthesia with the patient or authorized representative who has indicated his/her understanding and acceptance.     Dental advisory given  Plan Discussed with: CRNA,  Anesthesiologist and Surgeon  Anesthesia Plan Comments:         Anesthesia Quick Evaluation

## 2021-03-24 NOTE — Transfer of Care (Signed)
Immediate Anesthesia Transfer of Care Note  Patient: Jose Washington  Procedure(s) Performed: COLONOSCOPY WITH PROPOFOL (N/A ) ESOPHAGOGASTRODUODENOSCOPY (EGD) WITH PROPOFOL (N/A )  Patient Location: PACU and Endoscopy Unit  Anesthesia Type:General  Level of Consciousness: drowsy and patient cooperative  Airway & Oxygen Therapy: Patient Spontanous Breathing  Post-op Assessment: Report given to RN and Post -op Vital signs reviewed and stable  Post vital signs: Reviewed and stable  Last Vitals:  Vitals Value Taken Time  BP 97/66 03/24/21 1009  Temp 36.2 C 03/24/21 1008  Pulse 65 03/24/21 1011  Resp 20 03/24/21 1011  SpO2 95 % 03/24/21 1011  Vitals shown include unvalidated device data.  Last Pain:  Vitals:   03/24/21 1008  TempSrc: Temporal  PainSc: Asleep         Complications: No complications documented.

## 2021-03-24 NOTE — Op Note (Signed)
[facility] Gastroenterology Patient Name: Jose Washington Procedure Date: 03/24/2021 9:26 AM MRN: [id number] Account #: [id number] Date of Birth: May 05, 1966 Admit Type: Outpatient Age: 55 Room: [facility] ENDO ROOM 4 Gender: Male Note Status: Finalized Procedure:             Colonoscopy Indications:           Screening for colorectal malignant neoplasm Providers:             Lucilla Lame MD, MD Referring MD:          Jon Billings (Referring MD) Medicines:             Propofol per Anesthesia Complications:         No immediate complications. Procedure:             Pre-Anesthesia Assessment:                        - Prior to the procedure, a History and Physical was                         performed, and patient medications and allergies were                         reviewed. The patient's tolerance of previous                         anesthesia was also reviewed. The risks and benefits                         of the procedure and the sedation options and risks                         were discussed with the patient. All questions were                         answered, and informed consent was obtained. Prior                         Anticoagulants: The patient has taken no previous                         anticoagulant or antiplatelet agents. ASA Grade                         Assessment: II - A patient with mild systemic disease.                         After reviewing the risks and benefits, the patient                         was deemed in satisfactory condition to undergo the                         procedure.                        After obtaining informed consent, the colonoscope was  passed under direct vision. Throughout the procedure,                         the patient's blood pressure, pulse, and oxygen                         saturations were monitored continuously. The                         Colonoscope was introduced through the  anus and                         advanced to the the cecum, identified by appendiceal                         orifice and ileocecal valve. The colonoscopy was                         performed without difficulty. The patient tolerated                         the procedure well. The quality of the bowel                         preparation was adequate to identify polyps. Findings:      The perianal and digital rectal examinations were normal.      A 5 mm polyp was found in the distal sigmoid colon. The polyp was       sessile. The polyp was removed with a cold snare. Resection and       retrieval were complete.      Two sessile polyps were found in the transverse colon. The polyps were 3       to 5 mm in size. These polyps were removed with a cold snare. Resection       and retrieval were complete.      Non-bleeding internal hemorrhoids were found during retroflexion. The       hemorrhoids were Grade I (internal hemorrhoids that do not prolapse). Impression:            - One 5 mm polyp in the distal sigmoid colon, removed                         with a cold snare. Resected and retrieved.                        - Two 3 to 5 mm polyps in the transverse colon,                         removed with a cold snare. Resected and retrieved.                        - Non-bleeding internal hemorrhoids. Recommendation:        - Discharge patient to home.                        - Resume previous diet.                        -  Continue present medications.                        - Await pathology results.                        - Repeat colonoscopy in 7 years for surveillance if                         adenomatous and 10 years if hyperplastic Procedure Code(s):     --- Professional ---                        [phone], Colonoscopy, flexible; with removal of                         tumor(s), polyp(s), or other lesion(s) by snare                         technique Diagnosis Code(s):     --- Professional ---                         Z12.11, Encounter for screening for malignant neoplasm                         of colon                        K63.5, Polyp of colon CPT copyright 2019 [employer] Association. All rights reserved. The codes documented in this report are preliminary and upon coder review may  be revised to meet current compliance requirements. Lucilla Lame MD, MD 03/24/2021 10:07:53 AM This report has been signed electronically. Number of Addenda: 0 Note Initiated On: 03/24/2021 9:26 AM Scope Withdrawal Time: 0 hours 10 minutes 38 seconds  Total Procedure Duration: 0 hours 13 minutes 53 seconds  Estimated Blood Loss:  Estimated blood loss: none.      [facility]

## 2021-03-24 NOTE — H&P (Signed)
Lucilla Lame, MD [facility] [address], [place], [place] [address] Phone:[phone] Fax : [phone]  Primary Care Physician:  Jon Billings, NP Primary Gastroenterologist:  Dr. Allen Norris  Pre-Procedure History & Physical: HPI:  Jose Washington is a 55 y.o. male is here for an endoscopy and colonoscopy.   Past Medical History:  Diagnosis Date  . Allergy   . Anxiety   . Chronic duodenal ulcer with hemorrhage 2012  . Chronic neck pain   . Depression   . GERD (gastroesophageal reflux disease)   . Hyperlipidemia   . Hypertension   . Microscopic hematuria     Past Surgical History:  Procedure Laterality Date  . bLEEDING ULCER  2012  . eye muscle repair  1972 and 1975  . FLEXIBLE SIGMOIDOSCOPY      Prior to Admission medications   Medication Sig Start Date End Date Taking? Authorizing Provider  acetaminophen (TYLENOL) 500 MG tablet Take 1 tablet (500 mg total) by mouth every 6 (six) hours as needed. 10/16/19  Yes Laban Emperor, PA-C  atorvastatin (LIPITOR) 40 MG tablet Take 1 tablet (40 mg total) by mouth daily. 12/24/20  Yes Cannady, Jolene T, NP  baclofen (LIORESAL) 10 MG tablet Take 1 tablet (10 mg total) by mouth at bedtime. 12/24/20  Yes Cannady, Jolene T, NP  busPIRone (BUSPAR) 30 MG tablet Take 1 tablet (30 mg total) by mouth 2 (two) times daily. 12/24/20  Yes Cannady, Jolene T, NP  DULoxetine (CYMBALTA) 30 MG capsule Take 1 capsule (30 mg total) by mouth 2 (two) times daily. 12/24/20  Yes Cannady, Jolene T, NP  fenofibrate 160 MG tablet Take 1 tablet (160 mg total) by mouth daily. 12/24/20  Yes Cannady, Jolene T, NP  GI Cocktail (alum & mag hydroxide-simethicone/lidocaine)oral mixture Take 15 mLs by mouth 2 (two) times daily as needed. 03/16/21  Yes Lucilla Lame, MD  HYDROcodone-acetaminophen (NORCO/VICODIN) 5-325 MG tablet Take 1 tablet by mouth daily as needed for severe pain. Must last 30 days. 03/06/21 04/05/21 Yes Milinda Pointer, MD  hydrOXYzine  (ATARAX/VISTARIL) 25 MG tablet Take 1 tablet (25 mg total) by mouth 3 (three) times daily as needed. 12/24/20  Yes Cannady, Henrine Screws T, NP  levalbuterol (XOPENEX HFA) 45 MCG/ACT inhaler Inhale 1 puff into the lungs every 6 (six) hours as needed for wheezing. 12/24/20  Yes Cannady, Jolene T, NP  omeprazole (PRILOSEC) 20 MG capsule Take 1 capsule (20 mg total) by mouth daily. 12/24/20  Yes Cannady, Jolene T, NP  sucralfate (CARAFATE) 1 g tablet Take 1 tablet (1 g total) by mouth 2 (two) times daily. 12/24/20  Yes Cannady, Jolene T, NP  atenolol (TENORMIN) 50 MG tablet Take 1 tablet (50 mg total) by mouth daily. 12/24/20   Marnee Guarneri T, NP  Calcium Carbonate (CALCIUM 600 PO) Take 600 mg by mouth daily.    [provider]  Cholecalciferol (VITAMIN D3) 1000 units CAPS Take 1 capsule by mouth daily.    [provider]  desloratadine-pseudoephedrine (CLARINEX-D 12-HOUR) 2.5-120 MG 12 hr tablet Take 1 tablet by mouth as needed.    [provider]  DiphenhydrAMINE HCl (DIPHEDRYL ALLERGY PO) Take 25 mg by mouth daily.    [provider]  ELDERBERRY PO Take 1,000 mg by mouth 3 (three) times a week.    [provider]  FLOWFLEX COVID-19 AG HOME TEST KIT FOLLOW INSTRUCTIONS INCLUDED WITH THE PACKAGE. 12/18/20   [provider]  fluticasone (FLONASE) 50 MCG/ACT nasal spray Place 2 sprays into  both nostrils 2 (two) times daily.    [provider]  Glucosamine-Chondroit-Vit C-Mn (GLUCOSAMINE 1500 COMPLEX PO) Take by mouth 2 (two) times daily.    [provider]  HYDROcodone-acetaminophen (NORCO/VICODIN) 5-325 MG tablet Take 1 tablet by mouth daily as needed for severe pain. Must last 30 days. 04/05/21 05/05/21  Milinda Pointer, MD  ibuprofen (ADVIL,MOTRIN) 200 MG tablet Take 200 mg by mouth as needed (take 5 times a week).     [provider]  magnesium oxide (MAG-OX) 400 MG tablet Take 400 mg by mouth daily.    [provider]   Melatonin 5 MG TABS Take by mouth at bedtime.     [provider]  Methylsulfonylmethane (MSM PO) Take by mouth daily.     [provider]  Multiple Vitamin (MULTIVITAMIN) tablet Take 1 tablet by mouth daily.    [provider]  ondansetron (ZOFRAN) 8 MG tablet Take 1 tablet (8 mg total) by mouth 2 (two) times daily. 12/24/20   Cannady, Henrine Screws T, NP  pantoprazole (PROTONIX) 40 MG tablet Take 1 tablet (40 mg total) by mouth daily. 02/08/21 02/08/22  Cuthriell, Charline Bills, PA-C  Potassium 99 MG TABS Take 1 tablet by mouth.    [provider]  simethicone (MYLICON) 80 MG chewable tablet Chew 80 mg by mouth every 6 (six) hours as needed for flatulence.    [provider]  Sodium Sulfate-Mag Sulfate-KCl (SUTAB) [phone] MG TABS Take 376 mg by mouth as directed. 03/20/21   Lucilla Lame, MD  vitamin B-12 (CYANOCOBALAMIN) 1000 MCG tablet Take 1,000 mcg by mouth daily.    [provider]  vitamin E 180 MG (400 UNITS) capsule Take 400 Units by mouth daily.    [provider]    Allergies as of 03/16/2021  . (No Known Allergies)    Family History  Problem Relation Age of Onset  . Diabetes Mother   . Cancer Mother        breast- in remission  . Mental illness Father   . Depression Father   . Dementia Father        VASCULAR  . Allergies Brother   . Cancer Maternal Grandfather        colon  . Stroke Paternal Grandfather   . Dementia Paternal Grandfather        vascular    Social History   Socioeconomic History  . Marital status: Single    Spouse name: Not on file  . Number of children: Not on file  . Years of education: Not on file  . Highest education level: Not on file  Occupational History  . Not on file  Tobacco Use  . Smoking status: Never Smoker  . Smokeless tobacco: Never Used  Vaping Use  . Vaping Use: Never used  Substance and Sexual Activity  . Alcohol use: No    Alcohol/week: 0.0 standard drinks  . Drug  use: No  . Sexual activity: Never    Partners: Female  Other Topics Concern  . Not on file  Social History Narrative  . Not on file   Social Determinants of Health   Financial Resource Strain: Not on file  Food Insecurity: Not on file  Transportation Needs: Not on file  Physical Activity: Not on file  Stress: Not on file  Social Connections: Not on file  Intimate Partner Violence: Not on file    Review of Systems: See HPI, otherwise negative ROS  Physical Exam: BP (!) 136/104  Pulse 75   Temp (!) 97.4 F (36.3 C) (Skin)   Resp 18   Ht '5\' 7"'  (1.702 m)   Wt 83.5 kg   SpO2 99%   BMI 28.82 kg/m  General:   Alert,  pleasant and cooperative in NAD Head:  Normocephalic and atraumatic. Neck:  Supple; no masses or thyromegaly. Lungs:  Clear throughout to auscultation.    Heart:  Regular rate and rhythm. Abdomen:  Soft, nontender and nondistended. Normal bowel sounds, without guarding, and without rebound.   Neurologic:  Alert and  oriented x4;  grossly normal neurologically.  Impression/Plan: Jose Washington is here for an endoscopy and colonoscopy to be performed for colonoscopy and esophagogastroduodenoscopy  Risks, benefits, limitations, and alternatives regarding  endoscopy and colonoscopy have been reviewed with the patient.  Questions have been answered.  All parties agreeable.   Lucilla Lame, MD  03/24/2021, 9:35 AM

## 2021-03-24 NOTE — Anesthesia Postprocedure Evaluation (Signed)
Anesthesia Post Note  Patient: Jose Washington  Procedure(s) Performed: COLONOSCOPY WITH PROPOFOL (N/A ) ESOPHAGOGASTRODUODENOSCOPY (EGD) WITH PROPOFOL (N/A )  Patient location during evaluation: Phase II Anesthesia Type: General Level of consciousness: awake and alert, awake and oriented Pain management: pain level controlled Vital Signs Assessment: post-procedure vital signs reviewed and stable Respiratory status: spontaneous breathing, nonlabored ventilation and respiratory function stable Cardiovascular status: blood pressure returned to baseline and stable Postop Assessment: no apparent nausea or vomiting Anesthetic complications: no   No complications documented.   Last Vitals:  Vitals:   03/24/21 1018 03/24/21 1028  BP: 114/82 121/81  Pulse: 62 61  Resp: 16 19  Temp:    SpO2: 96% 97%    Last Pain:  Vitals:   03/24/21 1028  TempSrc:   PainSc: 0-No pain                 Phill Mutter

## 2021-03-24 NOTE — Op Note (Signed)
[facility] Gastroenterology Patient Name: Jose Washington Procedure Date: 03/24/2021 9:26 AM MRN: [id number] Account #: [id number] Date of Birth: 06-Aug-1966 Admit Type: Outpatient Age: 55 Room: [facility] ENDO ROOM 4 Gender: Male Note Status: Finalized Procedure:             Upper GI endoscopy Indications:           Epigastric abdominal pain Providers:             Lucilla Lame MD, MD Referring MD:          Jon Billings (Referring MD) Medicines:             Propofol per Anesthesia Complications:         No immediate complications. Procedure:             Pre-Anesthesia Assessment:                        - Prior to the procedure, a History and Physical was                         performed, and patient medications and allergies were                         reviewed. The patient's tolerance of previous                         anesthesia was also reviewed. The risks and benefits                         of the procedure and the sedation options and risks                         were discussed with the patient. All questions were                         answered, and informed consent was obtained. Prior                         Anticoagulants: The patient has taken no previous                         anticoagulant or antiplatelet agents. ASA Grade                         Assessment: II - A patient with mild systemic disease.                         After reviewing the risks and benefits, the patient                         was deemed in satisfactory condition to undergo the                         procedure.                        After obtaining informed consent, the endoscope was  passed under direct vision. Throughout the procedure,                         the patient's blood pressure, pulse, and oxygen                         saturations were monitored continuously. The Endoscope                         was introduced through the mouth, and  advanced to the                         second part of duodenum. The upper GI endoscopy was                         accomplished without difficulty. The patient tolerated                         the procedure well. Findings:      A small hiatal hernia was present.      Moderate inflammation characterized by erythema was found in the gastric       antrum. Biopsies were taken with a cold forceps for histology.      One non-bleeding cratered duodenal ulcer with no stigmata of bleeding       was found in the second portion of the duodenum. Impression:            - Small hiatal hernia.                        - Gastritis. Biopsied.                        - Non-bleeding duodenal ulcer with no stigmata of                         bleeding. Recommendation:        - Discharge patient to home.                        - Resume previous diet.                        - Continue present medications.                        - Await pathology results.                        - Use a proton pump inhibitor PO daily. Procedure Code(s):     --- Professional ---                        [phone], Esophagogastroduodenoscopy, flexible,                         transoral; with biopsy, single or multiple Diagnosis Code(s):     --- Professional ---                        R10.13, Epigastric pain  K26.9, Duodenal ulcer, unspecified as acute or                         chronic, without hemorrhage or perforation                        K29.70, Gastritis, unspecified, without bleeding CPT copyright 2019 American Medical Association. All rights reserved. The codes documented in this report are preliminary and upon coder review may  be revised to meet current compliance requirements. Lucilla Lame MD, MD 03/24/2021 9:49:21 AM This report has been signed electronically. Number of Addenda: 0 Note Initiated On: 03/24/2021 9:26 AM Estimated Blood Loss:  Estimated blood loss: none.      [facility]

## 2021-03-25 ENCOUNTER — Encounter: Payer: Self-pay | Admitting: Gastroenterology

## 2021-03-26 LAB — SURGICAL PATHOLOGY

## 2021-03-31 ENCOUNTER — Encounter: Payer: Self-pay | Admitting: Gastroenterology

## 2021-04-28 NOTE — Progress Notes (Signed)
PROVIDER NOTE: Information contained herein reflects review and annotations entered in association with encounter. Interpretation of such information and data should be left to medically-trained personnel. Information provided to patient can be located elsewhere in the medical record under "Patient Instructions". Document created using STT-dictation technology, any transcriptional errors that may result from process are unintentional.    Patient: Jose Washington  Service Category: E/M  Provider: Gaspar Cola, MD  DOB: 12/09/1965  DOS: 04/29/2021  Specialty: Interventional Pain Management  MRN: [id number]  Setting: Ambulatory outpatient  PCP: Jon Billings, NP  Type: Established Patient    Referring Provider: Jon Billings, NP  Location: Office  Delivery: Face-to-face     HPI  Jose Washington, a 55 y.o. year old male, is here today because of his Chronic pain syndrome [G89.4]. Jose Washington primary complain today is Back Pain (Lower right) and Neck Pain (right) Last encounter: My last encounter with him was on 02/02/2021. Pertinent problems: Jose Washington has Chronic neck pain (3ry area of Pain) (Bilateral) (L>R); Chronic pain syndrome; Chronic low back pain (1ry area of Pain) (Bilateral) (L>R) w/o sciatica ; Chronic upper back pain (4th area of Pain) (Bilateral) (L>R); Chronic shoulder pain (Bilateral) (L>R); Osteoarthritis of AC (acromioclavicular) joint (Right); Chronic sacroiliac joint pain (Bilateral) (L>R); Muscle spasticity; Cervical DDD (C4-5, C5-6, C6-7 and C7-T1); Cervical foraminal stenosis (Bilateral: C5-6 & C6-7, Left: C4-5 & C7-T1); Cervical radiculitis (Bilateral) (L>R); Cervical facet syndrome (Bilateral) (L>R); Cervical spondylosis; Musculoskeletal neck pain (trapezius) (Left); Chronic knee pain (Left); Chronic lower extremity pain (2ry area of Pain) (Left); Grade 1 Retrolisthesis of L3 over L4; Lumbar facet arthropathy (Bilateral); Lumbar facet osteoarthritis; Lumbar facet  syndrome (Bilateral) (R>L); Lumbar spondylosis; Chronic right shoulder pain; Suprascapular neuropathy, right; DDD (degenerative disc disease), lumbosacral; Lumbar facet hypertrophy (Multilevel) (Bilateral); Spondylosis without myelopathy or radiculopathy, lumbosacral region; Inflammatory spondylopathy of lumbosacral region [facility]); Chronic lower extremity pain (Right); Chronic musculoskeletal pain; Abnormal MRI, lumbar spine (09/07/2017); Cervicalgia (Bilateral) (L>R); Spondylosis without myelopathy or radiculopathy, cervical region; Myofascial pain syndrome of thoracic spine (serratus muscle) (Right); Chronic flank pain (Right); Chronic hip pain (Bilateral); Chronic hip pain (Right); Enthesopathy of hip region (Right); and Osteoarthritis of hip (Right) on their pertinent problem list. Pain Assessment: Severity of Chronic pain is reported as a 2 /10. Location: Back Lower, Right/denies. Onset: More than a month ago. Quality: Stabbing, Dull, Aching. Timing: Intermittent. Modifying factor(s): chiropractor, stretching, tylenol, hydrocodone. Vitals:  height is '5\' 7"'  (1.702 m) and weight is 184 lb (83.5 kg). His temperature is 96.9 F (36.1 C) (abnormal). His blood pressure is 120/76 and his pulse is 67. His respiration is 16 and oxygen saturation is 99%.   Reason for encounter: medication management.   The patient indicates doing well with the current medication regimen. No adverse reactions or side effects reported to the medications.   RTCB: 08/03/2021 Nonopioids transferred 08/20/2020: Baclofen  Pharmacotherapy Assessment  Analgesic: Hydrocodone/APAP 5/325, 1 tab PO QD (5 mg/day of hydrocodone) MME/day: 5 mg/day.   Monitoring: [place] PMP: PDMP reviewed during this encounter.       Pharmacotherapy: No side-effects or adverse reactions reported. Compliance: No problems identified. Effectiveness: Clinically acceptable.  Dewayne Shorter, RN  04/29/2021  1:51 PM  Sign when Signing Visit Nursing Pain Medication  Assessment:  Safety precautions to be maintained throughout the outpatient stay will include: orient to surroundings, keep bed in low position, maintain call bell within reach at all times, provide assistance with transfer out of bed and ambulation.  Medication Inspection Compliance: Pill count conducted under aseptic conditions, in front of the patient. Neither the pills nor the bottle was removed from the patient's sight at any time. Once count was completed pills were immediately returned to the patient in their original bottle.  Medication: Hydrocodone/APAP Pill/Patch Count:  8 of 30 pills remain Pill/Patch Appearance: Markings consistent with prescribed medication Bottle Appearance: Standard pharmacy container. Clearly labeled. Filled Date: 06 / 05 / 2022 Last Medication intake:  Today    UDS:  Summary  Date Value Ref Range Status  11/26/2020 Note  Final    Comment:    ==================================================================== ToxASSURE Select 13 (MW) ==================================================================== Test                             Result       Flag       Units  Drug Present and Declared for Prescription Verification   Hydrocodone                    150          EXPECTED   ng/mg creat   Dihydrocodeine                 88           EXPECTED   ng/mg creat   Norhydrocodone                 244          EXPECTED   ng/mg creat    Sources of hydrocodone include scheduled prescription medications.    Dihydrocodeine and norhydrocodone are expected metabolites of    hydrocodone. Dihydrocodeine is also available as a scheduled    prescription medication.  ==================================================================== Test                      Result    Flag   Units      Ref Range   Creatinine              425              mg/dL      >=20 ==================================================================== Declared Medications:  The flagging and  interpretation on this report are based on the  following declared medications.  Unexpected results may arise from  inaccuracies in the declared medications.   **Note: The testing scope of this panel includes these medications:   Hydrocodone   **Note: The testing scope of this panel does not include the  following reported medications:   Acetaminophen  Albuterol  Atenolol  Atorvastatin  Baclofen  Buspirone  Calcium  Chondroitin  Cyanocobalamin  Desloratadine (Clarinex-D)  Diphenhydramine  Duloxetine  Fenofibrate  Fluticasone  Glucosamine  Hydroxyzine  Ibuprofen  Magnesium (Mag-Ox)  Melatonin  Methylsulfonylmethane  Multivitamin  Omeprazole  Ondansetron  Potassium  Pseudoephedrine (Clarinex-D)  Simethicone  Sucralfate  Supplement  Vitamin D3  Vitamin E ==================================================================== For clinical consultation, please call [phone]. ====================================================================      ROS  Constitutional: Denies any fever or chills Gastrointestinal: No reported hemesis, hematochezia, vomiting, or acute GI distress Musculoskeletal: Denies any acute onset joint swelling, redness, loss of ROM, or weakness Neurological: No reported episodes of acute onset apraxia, aphasia, dysarthria, agnosia, amnesia, paralysis, loss of coordination, or loss of consciousness  Medication Review  COVID-19 At Home Antigen Test, Calcium Carbonate, DULoxetine, Elderberry, GI Cocktail (alum & mag hydroxide-simethicone/lidocaine)oral mixture, Glucosamine-Chondroit-Vit C-Mn, HYDROcodone-acetaminophen, Methylsulfonylmethane,  Potassium, Vitamin D3, acetaminophen, atenolol, atorvastatin, baclofen, busPIRone, desloratadine-pseudoephedrine, diphenhydrAMINE HCl, docusate sodium, fenofibrate, fluticasone, hydrOXYzine, ibuprofen, levalbuterol, magnesium oxide, melatonin, multivitamin, omeprazole, ondansetron, simethicone, sucralfate,  vitamin B-12, and vitamin E  History Review  Allergy: Jose Washington has No Known Allergies. Drug: Jose Washington  reports no history of drug use. Alcohol:  reports no history of alcohol use. Tobacco:  reports that he has never smoked. He has never used smokeless tobacco. Social: Jose Washington  reports that he has never smoked. He has never used smokeless tobacco. He reports that he does not drink alcohol and does not use drugs. Medical:  has a past medical history of Allergy, Anxiety, Chronic duodenal ulcer with hemorrhage (2012), Chronic neck pain, Depression, GERD (gastroesophageal reflux disease), Hyperlipidemia, Hypertension, and Microscopic hematuria. Surgical: Jose Washington  has a past surgical history that includes eye muscle repair (1972 and 1975); bLEEDING ULCER (2012); Flexible sigmoidoscopy; Colonoscopy with propofol (N/A, 03/24/2021); and Esophagogastroduodenoscopy (egd) with propofol (N/A, 03/24/2021). Family: family history includes Allergies in his brother; Cancer in his maternal grandfather and mother; Dementia in his father and paternal grandfather; Depression in his father; Diabetes in his mother; Mental illness in his father; Stroke in his paternal grandfather.  Laboratory Chemistry Profile   Renal Lab Results  Component Value Date   BUN 13 02/08/2021   CREATININE 1.11 02/08/2021   BCR 11 12/24/2020   GFRAA 84 12/24/2020   GFRNONAA >60 02/08/2021    Hepatic Lab Results  Component Value Date   AST 59 (H) 02/08/2021   ALT 60 (H) 02/08/2021   ALBUMIN 4.8 02/08/2021   ALKPHOS 51 02/08/2021   LIPASE 28 02/08/2021    Electrolytes Lab Results  Component Value Date   NA 138 02/08/2021   K 3.7 02/08/2021   CL 106 02/08/2021   CALCIUM 9.7 02/08/2021   MG 2.0 11/12/2016    Bone Lab Results  Component Value Date   VD25OH 37.9 12/24/2020   25OHVITD1 23 (L) 11/12/2016   25OHVITD2 <1.0 11/12/2016   25OHVITD3 23 11/12/2016    Inflammation (CRP: Acute Phase) (ESR: Chronic  Phase) Lab Results  Component Value Date   CRP 1.4 (H) 11/12/2016   ESRSEDRATE 3 11/12/2016          Note: Above Lab results reviewed.  Recent Imaging Review  CT ABDOMEN PELVIS W CONTRAST CLINICAL DATA:  55 year old male with epigastric pain. History of peptic ulcer.  EXAM: CT ABDOMEN AND PELVIS WITH CONTRAST  TECHNIQUE: Multidetector CT imaging of the abdomen and pelvis was performed using the standard protocol following bolus administration of intravenous contrast.  CONTRAST:  161m OMNIPAQUE IOHEXOL 300 MG/ML  SOLN  COMPARISON:  CT abdomen pelvis dated 08/14/2019.  FINDINGS: Lower chest: The visualized lung bases are clear.  No intra-abdominal free air or free fluid.  Hepatobiliary: Fatty liver. No intrahepatic biliary dilatation. Multiple stomal stones noted in the gallbladder. No pericholecystic fluid or evidence of acute cholecystitis by CT.  Pancreas: Unremarkable. No pancreatic ductal dilatation or surrounding inflammatory changes.  Spleen: Normal in size without focal abnormality.  Adrenals/Urinary Tract: The adrenal glands unremarkable. There is no hydronephrosis on either side. There is symmetric enhancement and excretion of contrast by both kidneys. The visualized ureters and urinary bladder appear unremarkable.  Stomach/Bowel: There is moderate stool throughout the colon. There is no bowel obstruction or active inflammation. The appendix is normal.  Vascular/Lymphatic: Mild atherosclerotic calcification of the abdominal aorta. The IVC is unremarkable. No portal venous gas. There is no adenopathy.  Reproductive: The  prostate and seminal vesicles are grossly unremarkable. No pelvic mass.  Other: Small fat containing right inguinal and umbilical hernias. No bowel herniation or fluid collection.  Musculoskeletal: Lower lumbar facet arthropathy. No acute osseous pathology.  IMPRESSION: 1. No acute intra-abdominal or pelvic pathology. 2.  Cholelithiasis. 3. Fatty liver. 4. Moderate colonic stool burden. No bowel obstruction. Normal appendix. 5. Aortic Atherosclerosis (ICD10-I70.0).  Electronically Signed   By: Anner Crete M.D.   On: 02/08/2021 20:16 Note: Reviewed        Physical Exam  General appearance: Well nourished, well developed, and well hydrated. In no apparent acute distress Mental status: Alert, oriented x 3 (person, place, & time)       Respiratory: No evidence of acute respiratory distress Eyes: PERLA Vitals: BP 120/76   Pulse 67   Temp (!) 96.9 F (36.1 C)   Resp 16   Ht '5\' 7"'  (1.702 m)   Wt 184 lb (83.5 kg)   SpO2 99%   BMI 28.82 kg/m  BMI: Estimated body mass index is 28.82 kg/m as calculated from the following:   Height as of this encounter: '5\' 7"'  (1.702 m).   Weight as of this encounter: 184 lb (83.5 kg). Ideal: Ideal body weight: 66.1 kg (145 lb 11.6 oz) Adjusted ideal body weight: 73 kg (161 lb 0.6 oz)  Assessment   Status Diagnosis  Controlled Controlled Controlled 1. Chronic pain syndrome   2. Chronic low back pain (1ry area of Pain) (Bilateral) (L>R) w/o sciatica    3. Chronic lower extremity pain (2ry area of Pain) (Left)   4. Chronic neck pain (3ry area of Pain) (Bilateral) (L>R)   5. Pharmacologic therapy   6. Chronic use of opiate for therapeutic purpose   7. Encounter for medication management   8. Opiate use (5 MME/Day)      Updated Problems: No problems updated.  Plan of Care  Problem-specific:  No problem-specific Assessment & Plan notes found for this encounter.  Jose Washington has a current medication list which includes the following long-term medication(s): atenolol, atorvastatin, baclofen, calcium carbonate, diphenhydramine hcl, duloxetine, fenofibrate, [START ON 05/05/2021] hydrocodone-acetaminophen, [START ON 06/04/2021] hydrocodone-acetaminophen, [START ON 07/04/2021] hydrocodone-acetaminophen, levalbuterol, omeprazole, potassium, simethicone, and  sucralfate.  Pharmacotherapy (Medications Ordered): Meds ordered this encounter  Medications   HYDROcodone-acetaminophen (NORCO/VICODIN) 5-325 MG tablet    Sig: Take 1 tablet by mouth daily as needed for severe pain. Must last 30 days.    Dispense:  30 tablet    Refill:  0    Not a duplicate. Do NOT delete! Dispense 1 day early if closed on fill date. Warn not to take CNS-depressants 8 hours before or after taking opioid. Do not send refill request. Renewal requires appointment.   HYDROcodone-acetaminophen (NORCO/VICODIN) 5-325 MG tablet    Sig: Take 1 tablet by mouth daily as needed for severe pain. Must last 30 days.    Dispense:  30 tablet    Refill:  0    Not a duplicate. Do NOT delete! Dispense 1 day early if closed on fill date. Warn not to take CNS-depressants 8 hours before or after taking opioid. Do not send refill request. Renewal requires appointment.   HYDROcodone-acetaminophen (NORCO/VICODIN) 5-325 MG tablet    Sig: Take 1 tablet by mouth daily as needed for severe pain. Must last 30 days.    Dispense:  30 tablet    Refill:  0    Not a duplicate. Do NOT delete! Dispense 1 day early if  closed on fill date. Warn not to take CNS-depressants 8 hours before or after taking opioid. Do not send refill request. Renewal requires appointment.    Orders:  Orders Placed This Encounter  Procedures   ToxASSURE Select 13 (MW), Urine    Volume: 30 ml(s). Minimum 3 ml of urine is needed. Document temperature of fresh sample. Indications: Long term (current) use of opiate analgesic (N00.370)    Order Specific Question:   Release to patient    Answer:   Immediate    Follow-up plan:   Return in about 3 months (around 08/03/2021) for evaluation day (F2F) (MM).     Interventional Therapies  Risk  Complexity Considerations:   WNL   Planned  Pending:   Abnormal UDS (03/27/2020) & (08/20/20) (+) undisclosed carboxy-THC    Under consideration:   Possible left lumbar facet RFA   Diagnostic left cervical facet block  Possible left cervical facet RFA  Diagnostic bilateral sacroiliac joint block  Possible bilateral sacroiliac joint RFA  Diagnostic trigger point injections    Completed:   Diagnostic/therapeutic right IA hip injection x1 (12/09/2020)  Palliative right lumbar facet RFA x1 (11/22/2019) Palliative right lumbar facet MBB x2  Diagnostic/therapeutic left lumbar facet MBB x1  Diagnostic/therapeutic left L4-5 LESI x2  Palliative left CESI x3  Diagnostic right cervical facet MBB x1 (03/13/2020) (100/100/100) Diagnostic left cervical facet MBB x1 (01/10/2020) (100/100/100)   Therapeutic  Palliative (PRN) options:   Palliative right lumbar facet RFA #2  Palliative right lumbar facet block #3  Diagnostic/therapeutic left lumbar facet block #2  Diagnostic/therapeutic left L4-5 LESI #3  Palliative left CESI #4  Diagnostic right-sided cervical facet block #2  Diagnostic left-sided cervical facet block #2     Recent Visits Date Type Provider Dept  02/02/21 Office Visit Milinda Pointer, MD [facility]  Showing recent visits within past 90 days and meeting all other requirements Today's Visits Date Type Provider Dept  04/29/21 Office Visit Milinda Pointer, MD [facility]  Showing today's visits and meeting all other requirements Future Appointments No visits were found meeting these conditions. Showing future appointments within next 90 days and meeting all other requirements I discussed the assessment and treatment plan with the patient. The patient was provided an opportunity to ask questions and all were answered. The patient agreed with the plan and demonstrated an understanding of the instructions.  Patient advised to call back or seek an in-person evaluation if the symptoms or condition worsens.  Duration of encounter: 30 minutes.  Note by: Gaspar Cola, MD Date: 04/29/2021; Time: 2:11 PM

## 2021-04-29 ENCOUNTER — Other Ambulatory Visit: Payer: Self-pay

## 2021-04-29 ENCOUNTER — Ambulatory Visit: Payer: No Typology Code available for payment source | Attending: Pain Medicine | Admitting: Pain Medicine

## 2021-04-29 ENCOUNTER — Encounter: Payer: Self-pay | Admitting: Pain Medicine

## 2021-04-29 VITALS — BP 120/76 | HR 67 | Temp 96.9°F | Resp 16 | Ht 67.0 in | Wt 184.0 lb

## 2021-04-29 DIAGNOSIS — Z79891 Long term (current) use of opiate analgesic: Secondary | ICD-10-CM | POA: Insufficient documentation

## 2021-04-29 DIAGNOSIS — G8929 Other chronic pain: Secondary | ICD-10-CM | POA: Diagnosis present

## 2021-04-29 DIAGNOSIS — M545 Low back pain, unspecified: Secondary | ICD-10-CM

## 2021-04-29 DIAGNOSIS — M542 Cervicalgia: Secondary | ICD-10-CM | POA: Diagnosis present

## 2021-04-29 DIAGNOSIS — M79605 Pain in left leg: Secondary | ICD-10-CM | POA: Insufficient documentation

## 2021-04-29 DIAGNOSIS — F1190 Opioid use, unspecified, uncomplicated: Secondary | ICD-10-CM

## 2021-04-29 DIAGNOSIS — G894 Chronic pain syndrome: Secondary | ICD-10-CM | POA: Diagnosis present

## 2021-04-29 DIAGNOSIS — Z79899 Other long term (current) drug therapy: Secondary | ICD-10-CM | POA: Insufficient documentation

## 2021-04-29 MED ORDER — HYDROCODONE-ACETAMINOPHEN 5-325 MG PO TABS: 1.0000 | ORAL_TABLET | Freq: Every day | ORAL | 0 refills | Status: DC | PRN

## 2021-04-29 NOTE — Progress Notes (Signed)
Nursing Pain Medication Assessment:  Safety precautions to be maintained throughout the outpatient stay will include: orient to surroundings, keep bed in low position, maintain call bell within reach at all times, provide assistance with transfer out of bed and ambulation.  Medication Inspection Compliance: Pill count conducted under aseptic conditions, in front of the patient. Neither the pills nor the bottle was removed from the patient's sight at any time. Once count was completed pills were immediately returned to the patient in their original bottle.  Medication: Hydrocodone/APAP Pill/Patch Count:  8 of 30 pills remain Pill/Patch Appearance: Markings consistent with prescribed medication Bottle Appearance: Standard pharmacy container. Clearly labeled. Filled Date: 06 / 05 / 2022 Last Medication intake:  Today

## 2021-04-29 NOTE — Patient Instructions (Signed)
____________________________________________________________________________________________  Medication Rules  Purpose: To inform patients, and their family members, of our rules and regulations.  Applies to: All patients receiving prescriptions (written or electronic).  Pharmacy of record: Pharmacy where electronic prescriptions will be sent. If written prescriptions are taken to a different pharmacy, please inform the nursing staff. The pharmacy listed in the electronic medical record should be the one where you would like electronic prescriptions to be sent.  Electronic prescriptions: In compliance with the [place] Strengthen Opioid Misuse Prevention (STOP) Act of 2017 (Session Law 2017-74/H243), effective November 01, 2018, all controlled substances must be electronically prescribed. Calling prescriptions to the pharmacy will cease to exist.  Prescription refills: Only during scheduled appointments. Applies to all prescriptions.  NOTE: The following applies primarily to controlled substances (Opioid* Pain Medications).   Type of encounter (visit): For patients receiving controlled substances, face-to-face visits are required. (Not an option or up to the patient.)  Patient's responsibilities: Pain Pills: Bring all pain pills to every appointment (except for procedure appointments). Pill Bottles: Bring pills in original pharmacy bottle. Always bring the newest bottle. Bring bottle, even if empty. Medication refills: You are responsible for knowing and keeping track of what medications you take and those you need refilled. The day before your appointment: write a list of all prescriptions that need to be refilled. The day of the appointment: give the list to the admitting nurse. Prescriptions will be written only during appointments. No prescriptions will be written on procedure days. If you forget a medication: it will not be "Called in", "Faxed", or "electronically sent". You will  need to get another appointment to get these prescribed. No early refills. Do not call asking to have your prescription filled early. Prescription Accuracy: You are responsible for carefully inspecting your prescriptions before leaving our office. Have the discharge nurse carefully go over each prescription with you, before taking them home. Make sure that your name is accurately spelled, that your address is correct. Check the name and dose of your medication to make sure it is accurate. Check the number of pills, and the written instructions to make sure they are clear and accurate. Make sure that you are given enough medication to last until your next medication refill appointment. Taking Medication: Take medication as prescribed. When it comes to controlled substances, taking less pills or less frequently than prescribed is permitted and encouraged. Never take more pills than instructed. Never take medication more frequently than prescribed.  Inform other Doctors: Always inform, all of your healthcare providers, of all the medications you take. Pain Medication from other Providers: You are not allowed to accept any additional pain medication from any other Doctor or Healthcare provider. There are two exceptions to this rule. (see below) In the event that you require additional pain medication, you are responsible for notifying us, as stated below. Cough Medicine: Often these contain an opioid, such as codeine or hydrocodone. Never accept or take cough medicine containing these opioids if you are already taking an opioid* medication. The combination may cause respiratory failure and death. Medication Agreement: You are responsible for carefully reading and following our Medication Agreement. This must be signed before receiving any prescriptions from our practice. Safely store a copy of your signed Agreement. Violations to the Agreement will result in no further prescriptions. (Additional copies of our  Medication Agreement are available upon request.) Laws, Rules, & Regulations: All patients are expected to follow all Federal and State Laws, Statutes, Rules, & Regulations. Ignorance of   the Laws does not constitute a valid excuse.  Illegal drugs and Controlled Substances: The use of illegal substances (including, but not limited to marijuana and its derivatives) and/or the illegal use of any controlled substances is strictly prohibited. Violation of this rule may result in the immediate and permanent discontinuation of any and all prescriptions being written by our practice. The use of any illegal substances is prohibited. Adopted CDC guidelines & recommendations: Target dosing levels will be at or below 60 MME/day. Use of benzodiazepines** is not recommended.  Exceptions: There are only two exceptions to the rule of not receiving pain medications from other Healthcare Providers. Exception #1 (Emergencies): In the event of an emergency (i.e.: accident requiring emergency care), you are allowed to receive additional pain medication. However, you are responsible for: As soon as you are able, call our office [phone], at any time of the day or night, and leave a message stating your name, the date and nature of the emergency, and the name and dose of the medication prescribed. In the event that your call is answered by a member of our staff, make sure to document and save the date, time, and the name of the person that took your information.  Exception #2 (Planned Surgery): In the event that you are scheduled by another doctor or dentist to have any type of surgery or procedure, you are allowed (for a period no longer than 30 days), to receive additional pain medication, for the acute post-op pain. However, in this case, you are responsible for picking up a copy of our "Post-op Pain Management for Surgeons" handout, and giving it to your surgeon or dentist. This document is available at our office, and  does not require an appointment to obtain it. Simply go to our office during business hours (Monday-Thursday from 8:00 AM to 4:00 PM) (Friday 8:00 AM to 12:00 Noon) or if you have a scheduled appointment with us, prior to your surgery, and ask for it by name. In addition, you are responsible for: calling our office [phone], at any time of the day or night, and leaving a message stating your name, name of your surgeon, type of surgery, and date of procedure or surgery. Failure to comply with your responsibilities may result in termination of therapy involving the controlled substances.  *Opioid medications include: morphine, codeine, oxycodone, oxymorphone, hydrocodone, hydromorphone, meperidine, tramadol, tapentadol, buprenorphine, fentanyl, methadone. **Benzodiazepine medications include: diazepam (Valium), alprazolam (Xanax), clonazepam (Klonopine), lorazepam (Ativan), clorazepate (Tranxene), chlordiazepoxide (Librium), estazolam (Prosom), oxazepam (Serax), temazepam (Restoril), triazolam (Halcion) (Last updated: 09/29/2020) ____________________________________________________________________________________________  ____________________________________________________________________________________________  Medication Recommendations and Reminders  Applies to: All patients receiving prescriptions (written and/or electronic).  Medication Rules & Regulations: These rules and regulations exist for your safety and that of others. They are not flexible and neither are we. Dismissing or ignoring them will be considered "non-compliance" with medication therapy, resulting in complete and irreversible termination of such therapy. (See document titled "Medication Rules" for more details.) In all conscience, because of safety reasons, we cannot continue providing a therapy where the patient does not follow instructions.  Pharmacy of record:  Definition: This is the pharmacy where your electronic  prescriptions will be sent.  We do not endorse any particular pharmacy, however, we have experienced problems with [facility] not securing enough medication supply for the community. We do not restrict you in your choice of pharmacy. However, once we write for your prescriptions, we will NOT be re-sending more prescriptions to fix restricted supply problems   created by your pharmacy, or your insurance.  The pharmacy listed in the electronic medical record should be the one where you want electronic prescriptions to be sent. If you choose to change pharmacy, simply notify our nursing staff.  Recommendations: Keep all of your pain medications in a safe place, under lock and key, even if you live alone. We will NOT replace lost, stolen, or damaged medication. After you fill your prescription, take 1 week's worth of pills and put them away in a safe place. You should keep a separate, properly labeled bottle for this purpose. The remainder should be kept in the original bottle. Use this as your primary supply, until it runs out. Once it's gone, then you know that you have 1 week's worth of medicine, and it is time to come in for a prescription refill. If you do this correctly, it is unlikely that you will ever run out of medicine. To make sure that the above recommendation works, it is very important that you make sure your medication refill appointments are scheduled at least 1 week before you run out of medicine. To do this in an effective manner, make sure that you do not leave the office without scheduling your next medication management appointment. Always ask the nursing staff to show you in your prescription , when your medication will be running out. Then arrange for the receptionist to get you a return appointment, at least 7 days before you run out of medicine. Do not wait until you have 1 or 2 pills left, to come in. This is very poor planning and does not take into consideration that we may need to  cancel appointments due to bad weather, sickness, or emergencies affecting our staff. DO NOT ACCEPT A "Partial Fill": If for any reason your pharmacy does not have enough pills/tablets to completely fill or refill your prescription, do not allow for a "partial fill". The law allows the pharmacy to complete that prescription within 72 hours, without requiring a new prescription. If they do not fill the rest of your prescription within those 72 hours, you will need a separate prescription to fill the remaining amount, which we will NOT provide. If the reason for the partial fill is your insurance, you will need to talk to the pharmacist about payment alternatives for the remaining tablets, but again, DO NOT ACCEPT A PARTIAL FILL, unless you can trust your pharmacist to obtain the remainder of the pills within 72 hours.  Prescription refills and/or changes in medication(s):  Prescription refills, and/or changes in dose or medication, will be conducted only during scheduled medication management appointments. (Applies to both, written and electronic prescriptions.) No refills on procedure days. No medication will be changed or started on procedure days. No changes, adjustments, and/or refills will be conducted on a procedure day. Doing so will interfere with the diagnostic portion of the procedure. No phone refills. No medications will be "called into the pharmacy". No Fax refills. No weekend refills. No Holliday refills. No after hours refills.  Remember:  Business hours are:  Monday to Thursday 8:00 AM to 4:00 PM Provider's Schedule: Dianely Krehbiel, MD - Appointments are:  Medication management: Monday and Wednesday 8:00 AM to 4:00 PM Procedure day: Tuesday and Thursday 7:30 AM to 4:00 PM Bilal Lateef, MD - Appointments are:  Medication management: Tuesday and Thursday 8:00 AM to 4:00 PM Procedure day: Monday and Wednesday 7:30 AM to 4:00 PM (Last update:  05/21/2020) ____________________________________________________________________________________________  ____________________________________________________________________________________________  CBD (cannabidiol) WARNING    Applicable to: All individuals currently taking or considering taking CBD (cannabidiol) and, more important, all patients taking opioid analgesic controlled substances (pain medication). (Example: oxycodone; oxymorphone; hydrocodone; hydromorphone; morphine; methadone; tramadol; tapentadol; fentanyl; buprenorphine; butorphanol; dextromethorphan; meperidine; codeine; etc.)  Legal status: CBD remains a Schedule I drug prohibited for any use. CBD is illegal with one exception. In the [place], CBD has a limited Food and Drug Administration (FDA) approval for the treatment of two specific types of epilepsy disorders. Only one CBD product has been approved by the FDA for this purpose: "Epidiolex". FDA is aware that some companies are marketing products containing cannabis and cannabis-derived compounds in ways that violate the Federal Food, Drug and Cosmetic Act (FD&C Act) and that may put the health and safety of consumers at risk. The FDA, a Federal agency, has not enforced the CBD status since 2018.   Legality: Some manufacturers ship CBD products nationally, which is illegal. Often such products are sold online and are therefore available throughout the country. CBD is openly sold in head shops and health food stores in some states where such sales have not been explicitly legalized. Selling unapproved products with unsubstantiated therapeutic claims is not only a violation of the law, but also can put patients at risk, as these products have not been proven to be safe or effective. Federal illegality makes it difficult to conduct research on CBD.  Reference: "FDA Regulation of Cannabis and Cannabis-Derived Products, Including Cannabidiol (CBD)" -  https://www.fda.gov/news-events/public-health-focus/fda-regulation-cannabis-and-cannabis-derived-products-including-cannabidiol-cbd  Warning: CBD is not FDA approved and has not undergo the same manufacturing controls as prescription drugs.  This means that the purity and safety of available CBD may be questionable. Most of the time, despite manufacturer's claims, it is contaminated with THC (delta-9-tetrahydrocannabinol - the chemical in marijuana responsible for the "HIGH").  When this is the case, the THC contaminant will trigger a positive urine drug screen (UDS) test for Marijuana (carboxy-THC). Because a positive UDS for any illicit substance is a violation of our medication agreement, your opioid analgesics (pain medicine) may be permanently discontinued.  MORE ABOUT CBD  General Information: CBD  is a derivative of the Marijuana (cannabis sativa) plant discovered in 1940. It is one of the 113 identified substances found in Marijuana. It accounts for up to 40% of the plant's extract. As of 2018, preliminary clinical studies on CBD included research for the treatment of anxiety, movement disorders, and pain. CBD is available and consumed in multiple forms, including inhalation of smoke or vapor, as an aerosol spray, and by mouth. It may be supplied as an oil containing CBD, capsules, dried cannabis, or as a liquid solution. CBD is thought not to be as psychoactive as THC (delta-9-tetrahydrocannabinol - the chemical in marijuana responsible for the "HIGH"). Studies suggest that CBD may interact with different biological target receptors in the body, including cannabinoid and other neurotransmitter receptors. As of 2018 the mechanism of action for its biological effects has not been determined.  Side-effects  Adverse reactions: Dry mouth, diarrhea, decreased appetite, fatigue, drowsiness, malaise, weakness, sleep disturbances, and others.  Drug interactions: CBC may interact with other medications  such as blood-thinners. (Last update: 06/07/2020) ____________________________________________________________________________________________  ____________________________________________________________________________________________  Drug Holidays (Slow)  What is a "Drug Holiday"? Drug Holiday: is the name given to the period of time during which a patient stops taking a medication(s) for the purpose of eliminating tolerance to the drug.  Benefits Improved effectiveness of opioids. Decreased opioid dose needed to achieve benefits. Improved pain with lesser dose.    What is tolerance? Tolerance: is the progressive decreased in effectiveness of a drug due to its repetitive use. With repetitive use, the body gets use to the medication and as a consequence, it loses its effectiveness. This is a common problem seen with opioid pain medications. As a result, a larger dose of the drug is needed to achieve the same effect that used to be obtained with a smaller dose.  How long should a "Drug Holiday" last? You should stay off of the pain medicine for at least 14 consecutive days. (2 weeks)  Should I stop the medicine "cold turkey"? No. You should always coordinate with your Pain Specialist so that he/she can provide you with the correct medication dose to make the transition as smoothly as possible.  How do I stop the medicine? Slowly. You will be instructed to decrease the daily amount of pills that you take by one (1) pill every seven (7) days. This is called a "slow downward taper" of your dose. For example: if you normally take four (4) pills per day, you will be asked to drop this dose to three (3) pills per day for seven (7) days, then to two (2) pills per day for seven (7) days, then to one (1) per day for seven (7) days, and at the end of those last seven (7) days, this is when the "Drug Holiday" would start.   Will I have withdrawals? By doing a "slow downward taper" like this one, it  is unlikely that you will experience any significant withdrawal symptoms. Typically, what triggers withdrawals is the sudden stop of a high dose opioid therapy. Withdrawals can usually be avoided by slowly decreasing the dose over a prolonged period of time. If you do not follow these instructions and decide to stop your medication abruptly, withdrawals may be possible.  What are withdrawals? Withdrawals: refers to the wide range of symptoms that occur after stopping or dramatically reducing opiate drugs after heavy and prolonged use. Withdrawal symptoms do not occur to patients that use low dose opioids, or those who take the medication sporadically. Contrary to benzodiazepine (example: Valium, Xanax, etc.) or alcohol withdrawals ("Delirium Tremens"), opioid withdrawals are not lethal. Withdrawals are the physical manifestation of the body getting rid of the excess receptors.  Expected Symptoms Early symptoms of withdrawal may include: Agitation Anxiety Muscle aches Increased tearing Insomnia Runny nose Sweating Yawning  Late symptoms of withdrawal may include: Abdominal cramping Diarrhea Dilated pupils Goose bumps Nausea Vomiting  Will I experience withdrawals? Due to the slow nature of the taper, it is very unlikely that you will experience any.  What is a slow taper? Taper: refers to the gradual decrease in dose.  (Last update: 05/21/2020) ____________________________________________________________________________________________    

## 2021-05-07 LAB — TOXASSURE SELECT 13 (MW), URINE

## 2021-06-23 ENCOUNTER — Encounter: Payer: Self-pay | Admitting: Nurse Practitioner

## 2021-06-23 ENCOUNTER — Ambulatory Visit (INDEPENDENT_AMBULATORY_CARE_PROVIDER_SITE_OTHER): Payer: No Typology Code available for payment source | Admitting: Nurse Practitioner

## 2021-06-23 ENCOUNTER — Other Ambulatory Visit: Payer: Self-pay

## 2021-06-23 VITALS — BP 106/68 | HR 52 | Temp 98.5°F | Ht 67.0 in | Wt 182.0 lb

## 2021-06-23 DIAGNOSIS — F325 Major depressive disorder, single episode, in full remission: Secondary | ICD-10-CM | POA: Diagnosis not present

## 2021-06-23 DIAGNOSIS — I10 Essential (primary) hypertension: Secondary | ICD-10-CM

## 2021-06-23 DIAGNOSIS — I700 Atherosclerosis of aorta: Secondary | ICD-10-CM

## 2021-06-23 DIAGNOSIS — R7309 Other abnormal glucose: Secondary | ICD-10-CM

## 2021-06-23 DIAGNOSIS — E559 Vitamin D deficiency, unspecified: Secondary | ICD-10-CM

## 2021-06-23 MED ORDER — ONDANSETRON HCL 8 MG PO TABS: 8.0000 mg | ORAL_TABLET | Freq: Two times a day (BID) | ORAL | 5 refills | Status: DC

## 2021-06-23 NOTE — Assessment & Plan Note (Signed)
Chronic.  Controlled.  Continue with current medication regimen of ASA and atorvastatin.  Labs ordered today.  Return to clinic in 6 months for reevaluation.  Call sooner if concerns arise.

## 2021-06-23 NOTE — Assessment & Plan Note (Signed)
Chronic. Well controlled on Duloxetine.  Continue with current medication regimen.  Follow up in 6 months.  Call sooner if concerns arise.

## 2021-06-23 NOTE — Assessment & Plan Note (Signed)
Chronic.  Controlled.  Continue with current medication regimen of Atenolol '50mg'$  daily.  Labs ordered today.  Return to clinic in 6 months for reevaluation.  Call sooner if concerns arise.

## 2021-06-23 NOTE — Progress Notes (Signed)
BP 106/68   Pulse (!) 52   Temp 98.5 F (36.9 C)   Ht _0  (1.702 m)   Wt 182 lb (82.6 kg)   SpO2 98%   BMI 28.51 kg/m    Subjective:    Patient ID: Jose Washington, male    DOB: Jul 16, 1966, 55 y.o.   MRN: [id number]  HPI: Jose Washington is a 55 y.o. male  Chief Complaint  Patient presents with   Hypertension   Hyperlipidemia   Gastroesophageal Reflux   Anxiety   Medication Refill    Zofran   HYPERTENSION / HYPERLIPIDEMIA Satisfied with current treatment? no Duration of hypertension: years BP monitoring frequency: not checking BP range:  BP medication side effects: no Past BP meds: atenolol Duration of hyperlipidemia: years Cholesterol medication side effects: no Cholesterol supplements: none Past cholesterol medications: atorvastain (lipitor) Medication compliance: excellent compliance Aspirin: no Recent stressors: no Recurrent headaches: no Visual changes: no Palpitations: no Dyspnea: no Chest pain: no Lower extremity edema: no Dizzy/lightheaded: no  DEPRESSION Patient states his mood is well controlled.  Denies concerns at visit today.  States the cymbalta and hydroxyzine are working well for him.  Denies SI. Continues with pain management every 3 months.  Relevant past medical, surgical, family and social history reviewed and updated as indicated. Interim medical history since our last visit reviewed. Allergies and medications reviewed and updated.  Review of Systems  Eyes:  Negative for visual disturbance.  Respiratory:  Negative for chest tightness and shortness of breath.   Cardiovascular:  Negative for chest pain, palpitations and leg swelling.  Neurological:  Negative for dizziness, light-headedness and headaches.  Psychiatric/Behavioral:  Negative for dysphoric mood and suicidal ideas. The patient is not nervous/anxious.    Per HPI unless specifically indicated above     Objective:    BP 106/68   Pulse (!) 52   Temp 98.5 F (36.9  C)   Ht _1  (1.702 m)   Wt 182 lb (82.6 kg)   SpO2 98%   BMI 28.51 kg/m   Wt Readings from Last 3 Encounters:  06/23/21 182 lb (82.6 kg)  04/29/21 184 lb (83.5 kg)  03/24/21 184 lb (83.5 kg)    Physical Exam Vitals and nursing note reviewed.  Constitutional:      General: He is not in acute distress.    Appearance: Normal appearance. He is not ill-appearing, toxic-appearing or diaphoretic.  HENT:     Head: Normocephalic.     Right Ear: External ear normal.     Left Ear: External ear normal.     Nose: Nose normal. No congestion or rhinorrhea.     Mouth/Throat:     Mouth: Mucous membranes are moist.  Eyes:     General:        Right eye: No discharge.        Left eye: No discharge.     Extraocular Movements: Extraocular movements intact.     Conjunctiva/sclera: Conjunctivae normal.     Pupils: Pupils are equal, round, and reactive to light.  Cardiovascular:     Rate and Rhythm: Normal rate and regular rhythm.     Heart sounds: No murmur heard. Pulmonary:     Effort: Pulmonary effort is normal. No respiratory distress.     Breath sounds: Normal breath sounds. No wheezing, rhonchi or rales.  Abdominal:     General: Abdomen is flat. Bowel sounds are normal.  Musculoskeletal:     Cervical back: Normal range of  motion and neck supple.  Skin:    General: Skin is warm and dry.     Capillary Refill: Capillary refill takes less than 2 seconds.  Neurological:     General: No focal deficit present.     Mental Status: He is alert and oriented to person, place, and time.  Psychiatric:        Mood and Affect: Mood normal.        Behavior: Behavior normal.        Thought Content: Thought content normal.        Judgment: Judgment normal.    Results for orders placed or performed in visit on 04/29/21  ToxASSURE Select 25 (MW), Urine  Result Value Ref Range   Summary Note       Assessment & Plan:   Problem List Items Addressed This Visit       Cardiovascular and  Mediastinum   Benign essential hypertension    Chronic.  Controlled.  Continue with current medication regimen of Atenolol 70m daily.  Labs ordered today.  Return to clinic in 6 months for reevaluation.  Call sooner if concerns arise.        Relevant Orders   Comp Met (CMET)   Aortic atherosclerosis [place]    Chronic.  Controlled.  Continue with current medication regimen of ASA and atorvastatin.  Labs ordered today.  Return to clinic in 6 months for reevaluation.  Call sooner if concerns arise.        Relevant Orders   Lipid Profile     Other   Vitamin D insufficiency    Labs ordered day.  Will make recommendations based on lab results.       Relevant Orders   Vitamin D (25 hydroxy)   Major depression in remission (HCC)    Chronic. Well controlled on Duloxetine.  Continue with current medication regimen.  Follow up in 6 months.  Call sooner if concerns arise.       Other Visit Diagnoses     Elevated glucose       Elevated glucose numnbers in the past. Will check A1c and make recommendations based on lab results.    Relevant Orders   HgB A1c        Follow up plan: Return in about 6 months (around 12/24/2021) for Physical and Fasting labs.

## 2021-06-23 NOTE — Assessment & Plan Note (Signed)
Labs ordered day.  Will make recommendations based on lab results.

## 2021-06-24 LAB — HEMOGLOBIN A1C
Est. average glucose Bld gHb Est-mCnc: 123 mg/dL
Hgb A1c MFr Bld: 5.9 % — ABNORMAL HIGH (ref 4.8–5.6)

## 2021-06-24 LAB — COMPREHENSIVE METABOLIC PANEL
ALT: 43 IU/L (ref 0–44)
AST: 36 IU/L (ref 0–40)
Albumin/Globulin Ratio: 2.1 (ref 1.2–2.2)
Albumin: 4.6 g/dL (ref 3.8–4.9)
Alkaline Phosphatase: 55 IU/L (ref 44–121)
BUN/Creatinine Ratio: 15 (ref 9–20)
BUN: 18 mg/dL (ref 6–24)
Bilirubin Total: 0.4 mg/dL (ref 0.0–1.2)
CO2: 20 mmol/L (ref 20–29)
Calcium: 9.6 mg/dL (ref 8.7–10.2)
Chloride: 103 mmol/L (ref 96–106)
Creatinine, Ser: 1.21 mg/dL (ref 0.76–1.27)
Globulin, Total: 2.2 g/dL (ref 1.5–4.5)
Glucose: 89 mg/dL (ref 65–99)
Potassium: 4.3 mmol/L (ref 3.5–5.2)
Sodium: 141 mmol/L (ref 134–144)
Total Protein: 6.8 g/dL (ref 6.0–8.5)
eGFR: 71 mL/min/{1.73_m2} (ref >59)

## 2021-06-24 LAB — VITAMIN D 25 HYDROXY (VIT D DEFICIENCY, FRACTURES): Vit D, 25-Hydroxy: 47.5 ng/mL (ref 30.0–100.0)

## 2021-06-24 LAB — LIPID PANEL
Chol/HDL Ratio: 4 ratio (ref 0.0–5.0)
Cholesterol, Total: 153 mg/dL (ref 100–199)
HDL: 38 mg/dL — ABNORMAL LOW (ref >39)
LDL Chol Calc (NIH): 90 mg/dL (ref 0–99)
Triglycerides: 143 mg/dL (ref 0–149)
VLDL Cholesterol Cal: 25 mg/dL (ref 5–40)

## 2021-06-24 NOTE — Progress Notes (Signed)
Hi Jose Washington.  It was a pleasure meeting you yesterday.  Your lab work looks great.  A1c and cholesterol remain well controlled. I will see you at our next visit.  Please let me know if you have any questions.

## 2021-07-28 NOTE — Progress Notes (Signed)
PROVIDER NOTE: Information contained herein reflects review and annotations entered in association with encounter. Interpretation of such information and data should be left to medically-trained personnel. Information provided to patient can be located elsewhere in the medical record under "Patient Instructions". Document created using STT-dictation technology, any transcriptional errors that may result from process are unintentional.    Patient: Jose Washington  Service Category: E/M  Provider: Gaspar Cola, MD  DOB: 1966-08-10  DOS: 07/29/2021  Specialty: Interventional Pain Management  MRN: [id number]  Setting: Ambulatory outpatient  PCP: Jon Billings, NP  Type: Established Patient    Referring Provider: Jon Billings, NP  Location: Office  Delivery: Face-to-face     HPI  Mr. MITCH ARQUETTE, a 55 y.o. year old male, is here today because of his No primary diagnosis found.. Mr. Haen primary complain today is Neck Pain and Back Pain (lower) Last encounter: My last encounter with him was on 04/29/2021. Pertinent problems: Mr. Panning has Chronic pain syndrome; Chronic shoulder pain (Bilateral) (L>R); Osteoarthritis of AC (acromioclavicular) joint (Right); Chronic sacroiliac joint pain (Bilateral) (L>R); Muscle spasticity; Cervical DDD (C4-5, C5-6, C6-7 and C7-T1); Cervical foraminal stenosis (Bilateral: C5-6 & C6-7, Left: C4-5 & C7-T1); Cervical radiculitis (Bilateral) (L>R); Cervical facet syndrome (Bilateral) (L>R); Cervical spondylosis; Musculoskeletal neck pain (trapezius) (Left); Chronic knee pain (Left); Grade 1 Retrolisthesis of L3 over L4; Lumbar facet arthropathy (Bilateral); Lumbar facet osteoarthritis; Lumbar facet syndrome (Bilateral) (R>L); Lumbar spondylosis; Chronic right shoulder pain; Suprascapular neuropathy, right; DDD (degenerative disc disease), lumbosacral; Lumbar facet hypertrophy (Multilevel) (Bilateral); Spondylosis without myelopathy or radiculopathy,  lumbosacral region; Inflammatory spondylopathy of lumbosacral region [facility]); Chronic lower extremity pain (Right); Chronic musculoskeletal pain; Cervicalgia (Bilateral) (L>R); Spondylosis without myelopathy or radiculopathy, cervical region; Myofascial pain syndrome of thoracic spine (serratus muscle) (Right); Chronic flank pain (Right); Chronic hip pain (Bilateral); Chronic hip pain (Right); Enthesopathy of hip region (Right); and Osteoarthritis of hip (Right) on their pertinent problem list. Pain Assessment: Severity of Chronic pain is reported as a 2 /10. Location: Back Lower, Right, Left (right side worse)/denies. Onset: More than a month ago. Quality: Constant, Sharp, Stabbing, Aching, Dull (neck dull achy on right side). Timing: Constant. Modifying factor(s): medication, streching, chiropator, resting. Vitals:  height is '5\' 7"'  (1.702 m) and weight is 183 lb (83 kg). His temporal temperature is 97.2 F (36.2 C) (abnormal). His blood pressure is 121/75 and his pulse is 68. His respiration is 16 and oxygen saturation is 100%.   Reason for encounter: medication management.   The patient indicates doing well with the current medication regimen. No adverse reactions or side effects reported to the medications.   RTCB: 11/01/2021 Nonopioids transferred 08/20/2020: Baclofen  Pharmacotherapy Assessment  Analgesic: Hydrocodone/APAP 5/325, 1 tab PO QD (5 mg/day of hydrocodone) MME/day: 5 mg/day.   Monitoring: [place] PMP: PDMP not reviewed this encounter.       Pharmacotherapy: No side-effects or adverse reactions reported. Compliance: No problems identified. Effectiveness: Clinically acceptable.  Ignatius Specking, RN  07/29/54/2022 10:51 AM  Sign when Signing Visit Nursing Pain Medication Assessment:  Safety precautions to be maintained throughout the outpatient stay will include: orient to surroundings, keep bed in low position, maintain call bell within reach at all times, provide assistance with transfer  out of bed and ambulation.  Medication Inspection Compliance: Pill count conducted under aseptic conditions, in front of the patient. Neither the pills nor the bottle was removed from the patient's sight at any time. Once count was completed pills were immediately  returned to the patient in their original bottle.  Medication: Hydrocodone/APAP Pill/Patch Count:  3 of 30 pills remain Pill/Patch Appearance: Markings consistent with prescribed medication Bottle Appearance: Standard pharmacy container. Clearly labeled. Filled Date: 52 / 4 / 2022 Last Medication intake:  Yesterday     UDS:  Summary  Date Value Ref Range Status  04/29/2021 Note  Final    Comment:    ==================================================================== [employer] [facility] 13 (MW) ==================================================================== Test                             Result       Flag       Units  Drug Present and Declared for Prescription Verification   Hydrocodone                    499          EXPECTED   ng/mg creat   Dihydrocodeine                 152          EXPECTED   ng/mg creat   Norhydrocodone                 433          EXPECTED   ng/mg creat    Sources of hydrocodone include scheduled prescription medications.    Dihydrocodeine and norhydrocodone are expected metabolites of    hydrocodone. Dihydrocodeine is also available as a scheduled    prescription medication.  ==================================================================== Test                      Result    Flag   Units      Ref Range   Creatinine              328              mg/dL      >=20 ==================================================================== Declared Medications:  The flagging and interpretation on this report are based on the  following declared medications.  Unexpected results may arise from  inaccuracies in the declared medications.   **Note: The testing scope of this panel includes these  medications:   Hydrocodone (Norco)   **Note: The testing scope of this panel does not include the  following reported medications:   Acetaminophen (Tylenol)  Acetaminophen (Norco)  Aluminum hydroxide  Atenolol  Atorvastatin  Baclofen  Buspirone (Buspar)  Cholecalciferol  Chondroitin  Cyanocobalamin  Desloratadine  Diphenhydramine  Docusate  Duloxetine (Cymbalta)  Fenofibrate  Fluticasone (Flonase)  Glucosamine  Hydroxyzine  Ibuprofen  Levalbuterol  Lidocaine  Magnesium  Melatonin  Methylsulfonylmethane  Multivitamin  Omeprazole  Ondansetron  Potassium  Pseudoephedrine  Simethicone  Sucralfate (Carafate)  Supplement  Vitamin C  Vitamin E ==================================================================== For clinical consultation, please call [phone]. ====================================================================      ROS  Constitutional: Denies any fever or chills Gastrointestinal: No reported hemesis, hematochezia, vomiting, or acute GI distress Musculoskeletal: Denies any acute onset joint swelling, redness, loss of ROM, or weakness Neurological: No reported episodes of acute onset apraxia, aphasia, dysarthria, agnosia, amnesia, paralysis, loss of coordination, or loss of consciousness  Medication Review  COVID-19 At Home Antigen Test, Calcium Carbonate, DULoxetine, Elderberry, Glucosamine-Chondroit-Vit C-Mn, HYDROcodone-acetaminophen, Methylsulfonylmethane, Potassium, Vitamin D3, acetaminophen, atenolol, atorvastatin, baclofen, busPIRone, desloratadine-pseudoephedrine, diphenhydrAMINE HCl, docusate sodium, fenofibrate, fluticasone, hydrOXYzine, ibuprofen, levalbuterol, magnesium oxide, melatonin, multivitamin, omeprazole, ondansetron, simethicone, sucralfate, vitamin B-12, and vitamin  E  History Review  Allergy: Mr. Elsayed has No Known Allergies. Drug: Mr. Madariaga  reports no history of drug use. Alcohol:  reports no history of alcohol  use. Tobacco:  reports that he has never smoked. He has never used smokeless tobacco. Social: Mr. Popp  reports that he has never smoked. He has never used smokeless tobacco. He reports that he does not drink alcohol and does not use drugs. Medical:  has a past medical history of Allergy, Anxiety, Chronic duodenal ulcer with hemorrhage (2012), Chronic neck pain, Depression, GERD (gastroesophageal reflux disease), Hyperlipidemia, Hypertension, and Microscopic hematuria. Surgical: Mr. Ambs  has a past surgical history that includes eye muscle repair (1972 and 1975); bLEEDING ULCER (2012); Flexible sigmoidoscopy; Colonoscopy with propofol (N/A, 03/24/2021); and Esophagogastroduodenoscopy (egd) with propofol (N/A, 03/24/2021). Family: family history includes Allergies in his brother; Cancer in his maternal grandfather and mother; Dementia in his father and paternal grandfather; Depression in his father; Diabetes in his mother; Mental illness in his father; Stroke in his paternal grandfather.  Laboratory Chemistry Profile   Renal Lab Results  Component Value Date   BUN 18 06/23/2021   CREATININE 1.21 06/23/2021   BCR 15 06/23/2021   GFRAA 84 12/24/2020   GFRNONAA >60 02/08/2021    Hepatic Lab Results  Component Value Date   AST 36 06/23/2021   ALT 43 06/23/2021   ALBUMIN 4.6 06/23/2021   ALKPHOS 55 06/23/2021   LIPASE 28 02/08/2021    Electrolytes Lab Results  Component Value Date   NA 141 06/23/2021   K 4.3 06/23/2021   CL 103 06/23/2021   CALCIUM 9.6 06/23/2021   MG 2.0 11/12/2016    Bone Lab Results  Component Value Date   VD25OH 47.5 06/23/2021   25OHVITD1 23 (L) 11/12/2016   25OHVITD2 <1.0 11/12/2016   25OHVITD3 23 11/12/2016    Inflammation (CRP: Acute Phase) (ESR: Chronic Phase) Lab Results  Component Value Date   CRP 1.4 (H) 11/12/2016   ESRSEDRATE 3 11/12/2016         Note: Above Lab results reviewed.  Recent Imaging Review  CT ABDOMEN PELVIS W  CONTRAST CLINICAL DATA:  55 year old male with epigastric pain. History of peptic ulcer.  EXAM: CT ABDOMEN AND PELVIS WITH CONTRAST  TECHNIQUE: Multidetector CT imaging of the abdomen and pelvis was performed using the standard protocol following bolus administration of intravenous contrast.  CONTRAST:  144m OMNIPAQUE IOHEXOL 300 MG/ML  SOLN  COMPARISON:  CT abdomen pelvis dated 08/14/2019.  FINDINGS: Lower chest: The visualized lung bases are clear.  No intra-abdominal free air or free fluid.  Hepatobiliary: Fatty liver. No intrahepatic biliary dilatation. Multiple stomal stones noted in the gallbladder. No pericholecystic fluid or evidence of acute cholecystitis by CT.  Pancreas: Unremarkable. No pancreatic ductal dilatation or surrounding inflammatory changes.  Spleen: Normal in size without focal abnormality.  Adrenals/Urinary Tract: The adrenal glands unremarkable. There is no hydronephrosis on either side. There is symmetric enhancement and excretion of contrast by both kidneys. The visualized ureters and urinary bladder appear unremarkable.  Stomach/Bowel: There is moderate stool throughout the colon. There is no bowel obstruction or active inflammation. The appendix is normal.  Vascular/Lymphatic: Mild atherosclerotic calcification of the abdominal aorta. The IVC is unremarkable. No portal venous gas. There is no adenopathy.  Reproductive: The prostate and seminal vesicles are grossly unremarkable. No pelvic mass.  Other: Small fat containing right inguinal and umbilical hernias. No bowel herniation or fluid collection.  Musculoskeletal: Lower lumbar facet arthropathy. No  acute osseous pathology.  IMPRESSION: 1. No acute intra-abdominal or pelvic pathology. 2. Cholelithiasis. 3. Fatty liver. 4. Moderate colonic stool burden. No bowel obstruction. Normal appendix. 5. Aortic Atherosclerosis (ICD10-I70.0).  Electronically Signed   By: Anner Crete M.D.   On: 02/08/2021 20:16 Note: Reviewed        Physical Exam  General appearance: Well nourished, well developed, and well hydrated. In no apparent acute distress Mental status: Alert, oriented x 3 (person, place, & time)       Respiratory: No evidence of acute respiratory distress Eyes: PERLA Vitals: BP 121/75 (BP Location: Right Arm, Patient Position: Sitting, Cuff Size: Large)   Pulse 68   Temp (!) 97.2 F (36.2 C) (Temporal)   Resp 16   Ht '5\' 7"'  (1.702 m)   Wt 183 lb (83 kg)   SpO2 100%   BMI 28.66 kg/m  BMI: Estimated body mass index is 28.66 kg/m as calculated from the following:   Height as of this encounter: '5\' 7"'  (1.702 m).   Weight as of this encounter: 183 lb (83 kg). Ideal: Ideal body weight: 66.1 kg (145 lb 11.6 oz) Adjusted ideal body weight: 72.9 kg (160 lb 10.1 oz)  Assessment   Status Diagnosis  Controlled Controlled Controlled 1. Chronic pain syndrome   2. Chronic low back pain (1ry area of Pain) (Bilateral) (L>R) w/o sciatica    3. Chronic lower extremity pain (2ry area of Pain) (Left)   4. Chronic neck pain (3ry area of Pain) (Bilateral) (L>R)   5. Pharmacologic therapy   6. Chronic use of opiate for therapeutic purpose   7. Encounter for medication management   8. Opiate use (5 MME/Day)      Updated Problems: Problem  Abnormal result on screening urine test (03/27/20 & 08/20/20)   Abnormal UDS (03/27/2020) & (08/20/20) (+) undisclosed carboxy-THC   Abnormal MRI, lumbar spine (09/07/2017)   Lumbar MRI FINDINGS: Vertebrae: Mild reactive marrow edema about the right L4-5 facet due to facet degeneration  Disc levels: L3-4: Mild diffuse disc bulge with disc desiccation and intervertebral disc space narrowing. Disc bulging eccentric to the left, contacting and potentially irritating the exiting left L3 nerve root as it courses of the left neural foramen (series 3, image 13). Mild facet ligamentum flavum hypertrophy. No significant canal or  subarticular stenosis. Mild bilateral L3 foraminal narrowing. L4-5: Mild diffuse disc bulge. Moderate right facet arthrosis with associated reactive edema (series 4, image 3). More mild left-sided facet arthrosis. Ligamentum flavum hypertrophy. No significant canal or subarticular stenosis. Mild to moderate bilateral L4 foraminal narrowing. L5-S1: Normal interspace. Mild lateral facet arthrosis. No canal or neural foraminal stenosis.   IMPRESSION: 1. Left eccentric disc bulge at L3-4, contacting and potentially irritating the exiting left L3 nerve root. 2. Disc bulge with facet arthropathy at L4-5 with resultant mild to moderate bilateral L4 foraminal stenosis, slightly worse on the right. 3. Moderate facet arthropathy with associated reactive edema about the right L4-5 facet. Finding could serve as a source for lower back pain.  Electronically Signed   By: Jeannine Boga M.D.   On: 09/07/2017 14:33   Chronic lower extremity pain (2ry area of Pain) (Left)  Chronic low back pain (1ry area of Pain) (Bilateral) (L>R) w/o sciatica   Chronic upper back pain (4th area of Pain) (Bilateral) (L>R)   Formatting of this note might be different from the original. Last Assessment & Plan:  Per pain clinic   Chronic neck pain (3ry area of  Pain) (Bilateral) (L>R)   Pt seems to be doing well with present medications.  Uses Hydrocodone sparingly.  Sees a chiropractor for regular treatments.     History of Illicit Drug Use  History of Marijuana Use  Pharmacologic Therapy  Long Term Current Use of Opiate Analgesic  Long Term Prescription Opiate Use  Opiate use (5 MME/Day)  Abdominal Pain, Epigastric  Aortic Atherosclerosis (Hcc)   Noted on imaging 08/14/19   Anxiety  Vitamin D Insufficiency   Formatting of this note might be different from the original. Last Assessment & Plan:  Per pain clinic     Plan of Care  Problem-specific:  No problem-specific Assessment & Plan notes found for this  encounter.  Mr. CLEVESTER HELZER has a current medication list which includes the following long-term medication(s): atenolol, atorvastatin, baclofen, calcium carbonate, diphenhydramine hcl, duloxetine, fenofibrate, [START ON 08/03/2021] hydrocodone-acetaminophen, [START ON 09/02/2021] hydrocodone-acetaminophen, [START ON 10/02/2021] hydrocodone-acetaminophen, levalbuterol, omeprazole, potassium, simethicone, and sucralfate.  Pharmacotherapy (Medications Ordered): Meds ordered this encounter  Medications   HYDROcodone-acetaminophen (NORCO/VICODIN) 5-325 MG tablet    Sig: Take 1 tablet by mouth daily as needed for severe pain. Must last 30 days.    Dispense:  30 tablet    Refill:  0    Not a duplicate. Do NOT delete! Dispense 1 day early if closed on fill date. Warn: no CNS-depressants within 8 hrs of opioid. Do not send refill request. Renewal requires appointment. No partial fills allowed   HYDROcodone-acetaminophen (NORCO/VICODIN) 5-325 MG tablet    Sig: Take 1 tablet by mouth daily as needed for severe pain. Must last 30 days.    Dispense:  30 tablet    Refill:  0    Not a duplicate. Do NOT delete! Dispense 1 day early if closed on fill date. Warn: no CNS-depressants within 8 hrs of opioid. Do not send refill request. Renewal requires appointment. No partial fills allowed   HYDROcodone-acetaminophen (NORCO/VICODIN) 5-325 MG tablet    Sig: Take 1 tablet by mouth daily as needed for severe pain. Must last 30 days.    Dispense:  30 tablet    Refill:  0    Not a duplicate. Do NOT delete! Dispense 1 day early if closed on fill date. Warn: no CNS-depressants within 8 hrs of opioid. Do not send refill request. Renewal requires appointment. No partial fills allowed    Orders:  No orders of the defined types were placed in this encounter.  Follow-up plan:   Return in about 3 months (around 11/01/2021) for Eval-day (M,W), (F2F), (MM).     Interventional Therapies  Risk  Complexity  Considerations:   WNL   Planned  Pending:   Abnormal UDS (03/27/2020) & (08/20/20) (+) undisclosed carboxy-THC    Under consideration:   Possible left lumbar facet RFA  Diagnostic left cervical facet block  Possible left cervical facet RFA  Diagnostic bilateral sacroiliac joint block  Possible bilateral sacroiliac joint RFA  Diagnostic trigger point injections    Completed:   Diagnostic/therapeutic right IA hip injection x1 (12/09/2020)  Palliative right lumbar facet RFA x1 (11/22/2019) Palliative right lumbar facet MBB x2  Diagnostic/therapeutic left lumbar facet MBB x1  Diagnostic/therapeutic left L4-5 LESI x2  Palliative left CESI x3  Diagnostic right cervical facet MBB x1 (03/13/2020) (100/100/100) Diagnostic left cervical facet MBB x1 (01/10/2020) (100/100/100)   Therapeutic  Palliative (PRN) options:   Palliative right lumbar facet RFA #2  Palliative right lumbar facet block #3  Diagnostic/therapeutic left lumbar facet  block #2  Diagnostic/therapeutic left L4-5 LESI #3  Palliative left CESI #4  Diagnostic right-sided cervical facet block #2  Diagnostic left-sided cervical facet block #2     Recent Visits No visits were found meeting these conditions. Showing recent visits within past 90 days and meeting all other requirements Today's Visits Date Type Provider Dept  07/29/21 Office Visit Milinda Pointer, MD [facility]  Showing today's visits and meeting all other requirements Future Appointments No visits were found meeting these conditions. Showing future appointments within next 90 days and meeting all other requirements I discussed the assessment and treatment plan with the patient. The patient was provided an opportunity to ask questions and all were answered. The patient agreed with the plan and demonstrated an understanding of the instructions.  Patient advised to call back or seek an in-person evaluation if the symptoms or condition  worsens.  Duration of encounter: 30 minutes.  Note by: Gaspar Cola, MD Date: 07/29/2021; Time: 12:31 PM

## 2021-07-29 ENCOUNTER — Other Ambulatory Visit: Payer: Self-pay

## 2021-07-29 ENCOUNTER — Ambulatory Visit: Payer: No Typology Code available for payment source | Attending: Pain Medicine | Admitting: Pain Medicine

## 2021-07-29 ENCOUNTER — Encounter: Payer: Self-pay | Admitting: Pain Medicine

## 2021-07-29 DIAGNOSIS — Z79899 Other long term (current) drug therapy: Secondary | ICD-10-CM | POA: Insufficient documentation

## 2021-07-29 DIAGNOSIS — G894 Chronic pain syndrome: Secondary | ICD-10-CM | POA: Insufficient documentation

## 2021-07-29 DIAGNOSIS — F1190 Opioid use, unspecified, uncomplicated: Secondary | ICD-10-CM | POA: Insufficient documentation

## 2021-07-29 DIAGNOSIS — M79605 Pain in left leg: Secondary | ICD-10-CM | POA: Insufficient documentation

## 2021-07-29 DIAGNOSIS — Z79891 Long term (current) use of opiate analgesic: Secondary | ICD-10-CM | POA: Diagnosis present

## 2021-07-29 DIAGNOSIS — G8929 Other chronic pain: Secondary | ICD-10-CM | POA: Insufficient documentation

## 2021-07-29 DIAGNOSIS — M542 Cervicalgia: Secondary | ICD-10-CM | POA: Insufficient documentation

## 2021-07-29 DIAGNOSIS — M545 Low back pain, unspecified: Secondary | ICD-10-CM | POA: Insufficient documentation

## 2021-07-29 MED ORDER — HYDROCODONE-ACETAMINOPHEN 5-325 MG PO TABS: 1.0000 | ORAL_TABLET | Freq: Every day | ORAL | 0 refills | Status: DC | PRN

## 2021-07-29 NOTE — Patient Instructions (Signed)
____________________________________________________________________________________________  Medication Rules  Purpose: To inform patients, and their family members, of our rules and regulations.  Applies to: All patients receiving prescriptions (written or electronic).  Pharmacy of record: Pharmacy where electronic prescriptions will be sent. If written prescriptions are taken to a different pharmacy, please inform the nursing staff. The pharmacy listed in the electronic medical record should be the one where you would like electronic prescriptions to be sent.  Electronic prescriptions: In compliance with the [place] Strengthen Opioid Misuse Prevention (STOP) Act of 2017 (Session Law 2017-74/H243), effective November 01, 2018, all controlled substances must be electronically prescribed. Calling prescriptions to the pharmacy will cease to exist.  Prescription refills: Only during scheduled appointments. Applies to all prescriptions.  NOTE: The following applies primarily to controlled substances (Opioid* Pain Medications).   Type of encounter (visit): For patients receiving controlled substances, face-to-face visits are required. (Not an option or up to the patient.)  Patient's responsibilities: Pain Pills: Bring all pain pills to every appointment (except for procedure appointments). Pill Bottles: Bring pills in original pharmacy bottle. Always bring the newest bottle. Bring bottle, even if empty. Medication refills: You are responsible for knowing and keeping track of what medications you take and those you need refilled. The day before your appointment: write a list of all prescriptions that need to be refilled. The day of the appointment: give the list to the admitting nurse. Prescriptions will be written only during appointments. No prescriptions will be written on procedure days. If you forget a medication: it will not be "Called in", "Faxed", or "electronically sent". You will  need to get another appointment to get these prescribed. No early refills. Do not call asking to have your prescription filled early. Prescription Accuracy: You are responsible for carefully inspecting your prescriptions before leaving our office. Have the discharge nurse carefully go over each prescription with you, before taking them home. Make sure that your name is accurately spelled, that your address is correct. Check the name and dose of your medication to make sure it is accurate. Check the number of pills, and the written instructions to make sure they are clear and accurate. Make sure that you are given enough medication to last until your next medication refill appointment. Taking Medication: Take medication as prescribed. When it comes to controlled substances, taking less pills or less frequently than prescribed is permitted and encouraged. Never take more pills than instructed. Never take medication more frequently than prescribed.  Inform other Doctors: Always inform, all of your healthcare providers, of all the medications you take. Pain Medication from other Providers: You are not allowed to accept any additional pain medication from any other Doctor or Healthcare provider. There are two exceptions to this rule. (see below) In the event that you require additional pain medication, you are responsible for notifying us, as stated below. Cough Medicine: Often these contain an opioid, such as codeine or hydrocodone. Never accept or take cough medicine containing these opioids if you are already taking an opioid* medication. The combination may cause respiratory failure and death. Medication Agreement: You are responsible for carefully reading and following our Medication Agreement. This must be signed before receiving any prescriptions from our practice. Safely store a copy of your signed Agreement. Violations to the Agreement will result in no further prescriptions. (Additional copies of our  Medication Agreement are available upon request.) Laws, Rules, & Regulations: All patients are expected to follow all Federal and State Laws, Statutes, Rules, & Regulations. Ignorance of   the Laws does not constitute a valid excuse.  Illegal drugs and Controlled Substances: The use of illegal substances (including, but not limited to marijuana and its derivatives) and/or the illegal use of any controlled substances is strictly prohibited. Violation of this rule may result in the immediate and permanent discontinuation of any and all prescriptions being written by our practice. The use of any illegal substances is prohibited. Adopted CDC guidelines & recommendations: Target dosing levels will be at or below 60 MME/day. Use of benzodiazepines** is not recommended.  Exceptions: There are only two exceptions to the rule of not receiving pain medications from other Healthcare Providers. Exception #1 (Emergencies): In the event of an emergency (i.e.: accident requiring emergency care), you are allowed to receive additional pain medication. However, you are responsible for: As soon as you are able, call our office [phone], at any time of the day or night, and leave a message stating your name, the date and nature of the emergency, and the name and dose of the medication prescribed. In the event that your call is answered by a member of our staff, make sure to document and save the date, time, and the name of the person that took your information.  Exception #2 (Planned Surgery): In the event that you are scheduled by another doctor or dentist to have any type of surgery or procedure, you are allowed (for a period no longer than 30 days), to receive additional pain medication, for the acute post-op pain. However, in this case, you are responsible for picking up a copy of our "Post-op Pain Management for Surgeons" handout, and giving it to your surgeon or dentist. This document is available at our office, and  does not require an appointment to obtain it. Simply go to our office during business hours (Monday-Thursday from 8:00 AM to 4:00 PM) (Friday 8:00 AM to 12:00 Noon) or if you have a scheduled appointment with us, prior to your surgery, and ask for it by name. In addition, you are responsible for: calling our office [phone], at any time of the day or night, and leaving a message stating your name, name of your surgeon, type of surgery, and date of procedure or surgery. Failure to comply with your responsibilities may result in termination of therapy involving the controlled substances. Medication Agreement Violation. Following the above rules, including your responsibilities will help you in avoiding a Medication Agreement Violation ("Breaking your Pain Medication Contract").  *Opioid medications include: morphine, codeine, oxycodone, oxymorphone, hydrocodone, hydromorphone, meperidine, tramadol, tapentadol, buprenorphine, fentanyl, methadone. **Benzodiazepine medications include: diazepam (Valium), alprazolam (Xanax), clonazepam (Klonopine), lorazepam (Ativan), clorazepate (Tranxene), chlordiazepoxide (Librium), estazolam (Prosom), oxazepam (Serax), temazepam (Restoril), triazolam (Halcion) (Last updated: 07/29/2021) ____________________________________________________________________________________________  ____________________________________________________________________________________________  Medication Recommendations and Reminders  Applies to: All patients receiving prescriptions (written and/or electronic).  Medication Rules & Regulations: These rules and regulations exist for your safety and that of others. They are not flexible and neither are we. Dismissing or ignoring them will be considered "non-compliance" with medication therapy, resulting in complete and irreversible termination of such therapy. (See document titled "Medication Rules" for more details.) In all conscience,  because of safety reasons, we cannot continue providing a therapy where the patient does not follow instructions.  Pharmacy of record:  Definition: This is the pharmacy where your electronic prescriptions will be sent.  We do not endorse any particular pharmacy, however, we have experienced problems with [facility] not securing enough medication supply for the community. We do not restrict you   in your choice of pharmacy. However, once we write for your prescriptions, we will NOT be re-sending more prescriptions to fix restricted supply problems created by your pharmacy, or your insurance.  The pharmacy listed in the electronic medical record should be the one where you want electronic prescriptions to be sent. If you choose to change pharmacy, simply notify our nursing staff.  Recommendations: Keep all of your pain medications in a safe place, under lock and key, even if you live alone. We will NOT replace lost, stolen, or damaged medication. After you fill your prescription, take 1 week's worth of pills and put them away in a safe place. You should keep a separate, properly labeled bottle for this purpose. The remainder should be kept in the original bottle. Use this as your primary supply, until it runs out. Once it's gone, then you know that you have 1 week's worth of medicine, and it is time to come in for a prescription refill. If you do this correctly, it is unlikely that you will ever run out of medicine. To make sure that the above recommendation works, it is very important that you make sure your medication refill appointments are scheduled at least 1 week before you run out of medicine. To do this in an effective manner, make sure that you do not leave the office without scheduling your next medication management appointment. Always ask the nursing staff to show you in your prescription , when your medication will be running out. Then arrange for the receptionist to get you a return appointment,  at least 7 days before you run out of medicine. Do not wait until you have 1 or 2 pills left, to come in. This is very poor planning and does not take into consideration that we may need to cancel appointments due to bad weather, sickness, or emergencies affecting our staff. DO NOT ACCEPT A "Partial Fill": If for any reason your pharmacy does not have enough pills/tablets to completely fill or refill your prescription, do not allow for a "partial fill". The law allows the pharmacy to complete that prescription within 72 hours, without requiring a new prescription. If they do not fill the rest of your prescription within those 72 hours, you will need a separate prescription to fill the remaining amount, which we will NOT provide. If the reason for the partial fill is your insurance, you will need to talk to the pharmacist about payment alternatives for the remaining tablets, but again, DO NOT ACCEPT A PARTIAL FILL, unless you can trust your pharmacist to obtain the remainder of the pills within 72 hours.  Prescription refills and/or changes in medication(s):  Prescription refills, and/or changes in dose or medication, will be conducted only during scheduled medication management appointments. (Applies to both, written and electronic prescriptions.) No refills on procedure days. No medication will be changed or started on procedure days. No changes, adjustments, and/or refills will be conducted on a procedure day. Doing so will interfere with the diagnostic portion of the procedure. No phone refills. No medications will be "called into the pharmacy". No Fax refills. No weekend refills. No Holliday refills. No after hours refills.  Remember:  Business hours are:  Monday to Thursday 8:00 AM to 4:00 PM Provider's Schedule: Demir Titsworth, MD - Appointments are:  Medication management: Monday and Wednesday 8:00 AM to 4:00 PM Procedure day: Tuesday and Thursday 7:30 AM to 4:00 PM Bilal Lateef, MD -  Appointments are:  Medication management: Tuesday and Thursday 8:00   AM to 4:00 PM Procedure day: Monday and Wednesday 7:30 AM to 4:00 PM (Last update: 05/21/2020) ____________________________________________________________________________________________  ____________________________________________________________________________________________  CBD (cannabidiol) WARNING  Applicable to: All individuals currently taking or considering taking CBD (cannabidiol) and, more important, all patients taking opioid analgesic controlled substances (pain medication). (Example: oxycodone; oxymorphone; hydrocodone; hydromorphone; morphine; methadone; tramadol; tapentadol; fentanyl; buprenorphine; butorphanol; dextromethorphan; meperidine; codeine; etc.)  Legal status: CBD remains a Schedule I drug prohibited for any use. CBD is illegal with one exception. In the [place], CBD has a limited Transport planner (FDA) approval for the treatment of two specific types of epilepsy disorders. Only one CBD product has been approved by the FDA for this purpose: "Epidiolex". FDA is aware that some companies are marketing products containing cannabis and cannabis-derived compounds in ways that violate the [employer], Drug and Cosmetic Act [facility] Act) and that may put the health and safety of consumers at risk. The FDA, a Federal agency, has not enforced the CBD status since 2018.   Legality: Some manufacturers ship CBD products nationally, which is illegal. Often such products are sold online and are therefore available throughout the country. CBD is openly sold in head shops and health food stores in some states where such sales have not been explicitly legalized. Selling unapproved products with unsubstantiated therapeutic claims is not only a violation of the law, but also can put patients at risk, as these products have not been proven to be safe or effective. Federal illegality makes it difficult to  conduct research on CBD.  Reference: "FDA Regulation of Cannabis and Cannabis-Derived Products, Including Cannabidiol (CBD)" - [employer]  Warning: CBD is not FDA approved and has not undergo the same manufacturing controls as prescription drugs.  This means that the purity and safety of available CBD may be questionable. Most of the time, despite manufacturer's claims, it is contaminated with THC (delta-9-tetrahydrocannabinol - the chemical in marijuana responsible for the "HIGH").  When this is the case, the [facility] contaminant will trigger a positive urine drug screen (UDS) test for Marijuana (carboxy-THC). Because a positive UDS for any illicit substance is a violation of our medication agreement, your opioid analgesics (pain medicine) may be permanently discontinued.  MORE ABOUT CBD  General Information: CBD  is a derivative of the Marijuana (cannabis sativa) plant discovered in 76. It is one of the 113 identified substances found in Marijuana. It accounts for up to 40% of the plant's extract. As of 2018, preliminary clinical studies on CBD included research for the treatment of anxiety, movement disorders, and pain. CBD is available and consumed in multiple forms, including inhalation of smoke or vapor, as an aerosol spray, and by mouth. It may be supplied as an oil containing CBD, capsules, dried cannabis, or as a liquid solution. CBD is thought not to be as psychoactive as THC (delta-9-tetrahydrocannabinol - the chemical in marijuana responsible for the "HIGH"). Studies suggest that CBD may interact with different biological target receptors in the body, including cannabinoid and other neurotransmitter receptors. As of 2018 the mechanism of action for its biological effects has not been determined.  Side-effects  Adverse reactions: Dry mouth, diarrhea, decreased appetite, fatigue,  drowsiness, malaise, weakness, sleep disturbances, and others.  Drug interactions: CBC may interact with other medications such as blood-thinners. (Last update: 06/07/2020) ____________________________________________________________________________________________  ____________________________________________________________________________________________  Drug Holidays (Slow)  What is a "Drug Holiday"? Drug Holiday: is the name given to the period of time during which a patient stops taking a medication(s) for the  purpose of eliminating tolerance to the drug.  Benefits Improved effectiveness of opioids. Decreased opioid dose needed to achieve benefits. Improved pain with lesser dose.  What is tolerance? Tolerance: is the progressive decreased in effectiveness of a drug due to its repetitive use. With repetitive use, the body gets use to the medication and as a consequence, it loses its effectiveness. This is a common problem seen with opioid pain medications. As a result, a larger dose of the drug is needed to achieve the same effect that used to be obtained with a smaller dose.  How long should a "Drug Holiday" last? You should stay off of the pain medicine for at least 14 consecutive days. (2 weeks)  Should I stop the medicine "cold [place]"? No. You should always coordinate with your Pain Specialist so that he/she can provide you with the correct medication dose to make the transition as smoothly as possible.  How do I stop the medicine? Slowly. You will be instructed to decrease the daily amount of pills that you take by one (1) pill every seven (7) days. This is called a "slow downward taper" of your dose. For example: if you normally take four (4) pills per day, you will be asked to drop this dose to three (3) pills per day for seven (7) days, then to two (2) pills per day for seven (7) days, then to one (1) per day for seven (7) days, and at the end of those last seven (7) days,  this is when the "Drug Holiday" would start.   Will I have withdrawals? By doing a "slow downward taper" like this one, it is unlikely that you will experience any significant withdrawal symptoms. Typically, what triggers withdrawals is the sudden stop of a high dose opioid therapy. Withdrawals can usually be avoided by slowly decreasing the dose over a prolonged period of time. If you do not follow these instructions and decide to stop your medication abruptly, withdrawals may be possible.  What are withdrawals? Withdrawals: refers to the wide range of symptoms that occur after stopping or dramatically reducing opiate drugs after heavy and prolonged use. Withdrawal symptoms do not occur to patients that use low dose opioids, or those who take the medication sporadically. Contrary to benzodiazepine (example: Valium, Xanax, etc.) or alcohol withdrawals ("Delirium Tremens"), opioid withdrawals are not lethal. Withdrawals are the physical manifestation of the body getting rid of the excess receptors.  Expected Symptoms Early symptoms of withdrawal may include: Agitation Anxiety Muscle aches Increased tearing Insomnia Runny nose Sweating Yawning  Late symptoms of withdrawal may include: Abdominal cramping Diarrhea Dilated pupils Goose bumps Nausea Vomiting  Will I experience withdrawals? Due to the slow nature of the taper, it is very unlikely that you will experience any.  What is a slow taper? Taper: refers to the gradual decrease in dose.  (Last update: 05/21/2020) ____________________________________________________________________________________________

## 2021-07-29 NOTE — Progress Notes (Signed)
Nursing Pain Medication Assessment:  Safety precautions to be maintained throughout the outpatient stay will include: orient to surroundings, keep bed in low position, maintain call bell within reach at all times, provide assistance with transfer out of bed and ambulation.  Medication Inspection Compliance: Pill count conducted under aseptic conditions, in front of the patient. Neither the pills nor the bottle was removed from the patient's sight at any time. Once count was completed pills were immediately returned to the patient in their original bottle.  Medication: Hydrocodone/APAP Pill/Patch Count:  3 of 30 pills remain Pill/Patch Appearance: Markings consistent with prescribed medication Bottle Appearance: Standard pharmacy container. Clearly labeled. Filled Date: 58 / 4 / 2022 Last Medication intake:  Yesterday

## 2021-10-02 ENCOUNTER — Other Ambulatory Visit: Payer: Self-pay

## 2021-10-02 ENCOUNTER — Ambulatory Visit: Payer: No Typology Code available for payment source | Admitting: Nurse Practitioner

## 2021-10-02 ENCOUNTER — Encounter: Payer: Self-pay | Admitting: Nurse Practitioner

## 2021-10-02 VITALS — BP 133/84 | HR 89 | Temp 98.6°F | Wt 183.2 lb

## 2021-10-02 DIAGNOSIS — J069 Acute upper respiratory infection, unspecified: Secondary | ICD-10-CM | POA: Diagnosis not present

## 2021-10-02 DIAGNOSIS — J029 Acute pharyngitis, unspecified: Secondary | ICD-10-CM | POA: Diagnosis not present

## 2021-10-02 NOTE — Progress Notes (Signed)
Acute Office Visit  Subjective:    Patient ID: Jose Washington, male    DOB: [birth year], 55 y.o.   MRN: [id number]  Chief Complaint  Patient presents with   Cough    Patient states he is having cough, and ST x 4 days.    Fever    Patient states he has been having fever highest 99.5    HPI Patient is in today for cough, sore throat, and low grade fever for 4 days.  UPPER RESPIRATORY TRACT INFECTION  Worst symptom: fatigue Fever: yes Cough: yes Shortness of breath: no Wheezing: no Chest pain: no Chest tightness: no Chest congestion: no Nasal congestion: yes Runny nose: yes Post nasal drip: yes Sneezing: no Sore throat: yes Swollen glands: no Sinus pressure: no Headache: yes Face pain: no Toothache: no Ear pain: no  Ear pressure: no  Eyes red/itching:no Eye drainage/crusting: no  Vomiting: no Rash: no Fatigue: yes Sick contacts: yes possibly at work Strep contacts: no  Context: worse Recurrent sinusitis: no Relief with OTC cold/cough medications: yes  Treatments attempted: netti pot, flonase, tylenol    Past Medical History:  Diagnosis Date   Allergy    Anxiety    Chronic duodenal ulcer with hemorrhage 2012   Chronic neck pain    Depression    GERD (gastroesophageal reflux disease)    Hyperlipidemia    Hypertension    Microscopic hematuria     Past Surgical History:  Procedure Laterality Date   bLEEDING ULCER  2012   COLONOSCOPY WITH PROPOFOL N/A 03/24/2021   Procedure: COLONOSCOPY WITH PROPOFOL;  Surgeon: Lucilla Lame, MD;  Location: [facility] ENDOSCOPY;  Service: Endoscopy;  Laterality: N/A;   ESOPHAGOGASTRODUODENOSCOPY (EGD) WITH PROPOFOL N/A 03/24/2021   Procedure: ESOPHAGOGASTRODUODENOSCOPY (EGD) WITH PROPOFOL;  Surgeon: Lucilla Lame, MD;  Location: [facility] ENDOSCOPY;  Service: Endoscopy;  Laterality: N/A;   eye muscle repair  1972 and 1975   FLEXIBLE SIGMOIDOSCOPY      Family History  Problem Relation Age of Onset   Diabetes Mother    Cancer  Mother        breast- in remission   Mental illness Father    Depression Father    Dementia Father        VASCULAR   Allergies Brother    Cancer Maternal Grandfather        colon   Stroke Paternal Grandfather    Dementia Paternal Grandfather        vascular    Social History   Socioeconomic History   Marital status: Single    Spouse name: Not on file   Number of children: Not on file   Years of education: Not on file   Highest education level: Not on file  Occupational History   Not on file  Tobacco Use   Smoking status: Never   Smokeless tobacco: Never  Vaping Use   Vaping Use: Never used  Substance and Sexual Activity   Alcohol use: No    Alcohol/week: 0.0 standard drinks   Drug use: No   Sexual activity: Never    Partners: Female  Other Topics Concern   Not on file  Social History Narrative   Not on file   Social Determinants of Health   Financial Resource Strain: Not on file  Food Insecurity: Not on file  Transportation Needs: Not on file  Physical Activity: Not on file  Stress: Not on file  Social Connections: Not on file  Intimate Partner Violence: Not on file  Outpatient Medications Prior to Visit  Medication Sig Dispense Refill   acetaminophen (TYLENOL) 500 MG tablet Take 1 tablet (500 mg total) by mouth every 6 (six) hours as needed. 30 tablet 0   atenolol (TENORMIN) 50 MG tablet Take 1 tablet (50 mg total) by mouth daily. 90 tablet 4   atorvastatin (LIPITOR) 40 MG tablet Take 1 tablet (40 mg total) by mouth daily. 90 tablet 4   baclofen (LIORESAL) 10 MG tablet Take 1 tablet (10 mg total) by mouth at bedtime. 90 tablet 4   busPIRone (BUSPAR) 30 MG tablet Take 1 tablet (30 mg total) by mouth 2 (two) times daily. 180 tablet 4   Calcium Carbonate (CALCIUM 600 PO) Take 600 mg by mouth daily.     Cholecalciferol (VITAMIN D3) 1000 units CAPS Take 1 capsule by mouth daily.     DiphenhydrAMINE HCl (DIPHEDRYL ALLERGY PO) Take 25 mg by mouth daily.      docusate sodium (COLACE) 100 MG capsule Take 100 mg by mouth daily.     DULoxetine (CYMBALTA) 30 MG capsule Take 1 capsule (30 mg total) by mouth 2 (two) times daily. 180 capsule 4   ELDERBERRY PO Take 1,000 mg by mouth 3 (three) times a week.     fenofibrate 160 MG tablet Take 1 tablet (160 mg total) by mouth daily. 90 tablet 4   fluticasone (FLONASE) 50 MCG/ACT nasal spray Place 2 sprays into both nostrils 2 (two) times daily.     Glucosamine-Chondroit-Vit C-Mn (GLUCOSAMINE 1500 COMPLEX PO) Take by mouth 2 (two) times daily.     HYDROcodone-acetaminophen (NORCO/VICODIN) 5-325 MG tablet Take 1 tablet by mouth daily as needed for severe pain. Must last 30 days. 30 tablet 0   HYDROcodone-acetaminophen (NORCO/VICODIN) 5-325 MG tablet Take 1 tablet by mouth daily as needed for severe pain. Must last 30 days. 30 tablet 0   hydrOXYzine (ATARAX/VISTARIL) 25 MG tablet Take 1 tablet (25 mg total) by mouth 3 (three) times daily as needed. 270 tablet 2   ibuprofen (ADVIL,MOTRIN) 200 MG tablet Take 200 mg by mouth as needed (take 5 times a week).      levalbuterol (XOPENEX HFA) 45 MCG/ACT inhaler Inhale 1 puff into the lungs every 6 (six) hours as needed for wheezing. 1 each 12   magnesium oxide (MAG-OX) 400 MG tablet Take 400 mg by mouth daily.     Melatonin 5 MG TABS Take by mouth at bedtime.      Methylsulfonylmethane (MSM PO) Take by mouth daily.      Multiple Vitamin (MULTIVITAMIN) tablet Take 1 tablet by mouth daily.     omeprazole (PRILOSEC) 40 MG capsule Take 1 capsule (40 mg total) by mouth daily before breakfast. 30 capsule 11   ondansetron (ZOFRAN) 8 MG tablet Take 1 tablet (8 mg total) by mouth 2 (two) times daily. 60 tablet 5   Potassium 99 MG TABS Take 1 tablet by mouth.     simethicone (MYLICON) 80 MG chewable tablet Chew 80 mg by mouth every 6 (six) hours as needed for flatulence.     sucralfate (CARAFATE) 1 g tablet Take 1 tablet (1 g total) by mouth 2 (two) times daily. 180 tablet 4    vitamin B-12 (CYANOCOBALAMIN) 1000 MCG tablet Take 1,000 mcg by mouth daily.     vitamin E 180 MG (400 UNITS) capsule Take 400 Units by mouth daily.     desloratadine-pseudoephedrine (CLARINEX-D 12-HOUR) 2.5-120 MG 12 hr tablet Take 1 tablet by mouth as needed. (Patient  not taking: Reported on 10/02/2021)     HYDROcodone-acetaminophen (NORCO/VICODIN) 5-325 MG tablet Take 1 tablet by mouth daily as needed for severe pain. Must last 30 days. 30 tablet 0   FLOWFLEX COVID-19 AG HOME TEST KIT FOLLOW INSTRUCTIONS INCLUDED WITH THE PACKAGE.     No facility-administered medications prior to visit.    No Known Allergies  Review of Systems  Constitutional:  Positive for fatigue and fever.  HENT:  Positive for congestion, postnasal drip, rhinorrhea and sore throat. Negative for ear pain, sinus pressure and sneezing.   Eyes: Negative.   Respiratory:  Positive for cough. Negative for shortness of breath.   Cardiovascular: Negative.   Gastrointestinal:  Positive for nausea. Negative for abdominal pain, constipation and diarrhea.  Endocrine: Negative.   Genitourinary: Negative.   Musculoskeletal:  Positive for myalgias.  Skin: Negative.   Neurological: Negative.   Psychiatric/Behavioral: Negative.        Objective:    Physical Exam Vitals and nursing note reviewed.  Constitutional:      Appearance: Normal appearance.  HENT:     Head: Normocephalic.     Right Ear: Tympanic membrane, ear canal and external ear normal.     Left Ear: Tympanic membrane, ear canal and external ear normal.  Eyes:     Conjunctiva/sclera: Conjunctivae normal.  Cardiovascular:     Rate and Rhythm: Normal rate and regular rhythm.     Pulses: Normal pulses.     Heart sounds: Normal heart sounds.  Pulmonary:     Effort: Pulmonary effort is normal.     Breath sounds: Normal breath sounds.  Abdominal:     Palpations: Abdomen is soft.     Tenderness: There is no abdominal tenderness.  Musculoskeletal:     Cervical  back: Normal range of motion.  Skin:    General: Skin is warm.  Neurological:     General: No focal deficit present.     Mental Status: He is alert and oriented to person, place, and time.  Psychiatric:        Mood and Affect: Mood normal.        Behavior: Behavior normal.        Thought Content: Thought content normal.        Judgment: Judgment normal.    BP 133/84 (BP Location: Right Arm, Patient Position: Sitting)   Pulse 89   Temp 98.6 F (37 C)   Wt 183 lb 3.2 oz (83.1 kg)   SpO2 99%   BMI 28.69 kg/m  Wt Readings from Last 3 Encounters:  10/02/21 183 lb 3.2 oz (83.1 kg)  07/29/21 183 lb (83 kg)  06/23/21 182 lb (82.6 kg)    Health Maintenance Due  Topic Date Due   Pneumococcal Vaccine 35-40 Years old (1 - PCV) Never done   Zoster Vaccines- Shingrix (2 of 2) 04/18/2017    There are no preventive care reminders to display for this patient.   Lab Results  Component Value Date   TSH 3.180 12/24/2020   Lab Results  Component Value Date   WBC 8.9 02/08/2021   HGB 15.1 02/08/2021   HCT 42.5 02/08/2021   MCV 86.0 02/08/2021   PLT 340 02/08/2021   Lab Results  Component Value Date   NA 141 06/23/2021   K 4.3 06/23/2021   CO2 20 06/23/2021   GLUCOSE 89 06/23/2021   BUN 18 06/23/2021   CREATININE 1.21 06/23/2021   BILITOT 0.4 06/23/2021   ALKPHOS 55 06/23/2021  AST 36 06/23/2021   ALT 43 06/23/2021   PROT 6.8 06/23/2021   ALBUMIN 4.6 06/23/2021   CALCIUM 9.6 06/23/2021   ANIONGAP 8 02/08/2021   EGFR 71 06/23/2021   Lab Results  Component Value Date   CHOL 153 06/23/2021   Lab Results  Component Value Date   HDL 38 (L) 06/23/2021   Lab Results  Component Value Date   LDLCALC 90 06/23/2021   Lab Results  Component Value Date   TRIG 143 06/23/2021   Lab Results  Component Value Date   CHOLHDL 4.0 06/23/2021   Lab Results  Component Value Date   HGBA1C 5.9 (H) 06/23/2021       Assessment & Plan:   Problem List Items Addressed This  Visit   None Visit Diagnoses     Upper respiratory tract infection, unspecified type    -  Primary   Flu negative, covid-19 test pending. Continue flonase, netti pot. Work note given to be out today and tomorrow. F/U if not improving.    Relevant Orders   Veritor Flu A/B Waived   Novel Coronavirus, NAA ([facility])   Sore throat       Rapid strep negative, will send for throat culture. Can take ibuprofen or tylenol as needed for pain and gargle with warm salt water.   Relevant Orders   Rapid Strep Screen ([facility] ONLY)        No orders of the defined types were placed in this encounter.    Charyl Dancer, NP

## 2021-10-04 LAB — NOVEL CORONAVIRUS, NAA: SARS-CoV-2, NAA: NOT DETECTED

## 2021-10-05 LAB — CULTURE, GROUP A STREP: Strep A Culture: NEGATIVE

## 2021-10-05 LAB — VERITOR FLU A/B WAIVED
Influenza A: NEGATIVE
Influenza B: NEGATIVE

## 2021-10-05 LAB — RAPID STREP SCREEN (MED CTR MEBANE ONLY): Strep Gp A Ag, IA W/Reflex: NEGATIVE

## 2021-10-21 ENCOUNTER — Telehealth: Payer: Self-pay

## 2021-10-21 NOTE — Telephone Encounter (Signed)
This patient wants to come in for an RFA. He said him and Dr. Dossie Arbour talked about it but there is no order. I will have to get authorization if Dr. Dossie Arbour puts an order in.

## 2021-10-27 NOTE — Progress Notes (Signed)
PROVIDER NOTE: Information contained herein reflects review and annotations entered in association with encounter. Interpretation of such information and data should be left to medically-trained personnel. Information provided to patient can be located elsewhere in the medical record under "Patient Instructions". Document created using STT-dictation technology, any transcriptional errors that may result from process are unintentional.    Patient: Jose Washington  Service Category: E/M  Provider: Gaspar Cola, MD  DOB: Mar 09, 1966  DOS: 10/28/2021  Specialty: Interventional Pain Management  MRN: [id number]  Setting: Ambulatory outpatient  PCP: Jon Billings, NP  Type: Established Patient    Referring Provider: Jon Billings, NP  Location: Office  Delivery: Face-to-face     HPI  Jose Washington, a 55 y.o. year old male, is here today because of his Chronic pain syndrome [G89.4]. Jose Washington primary complain today is Back Pain (lower) and Cervical Cancer (right) Last encounter: My last encounter with him was on 07/29/2021. Pertinent problems: Jose Washington has Chronic pain syndrome; Chronic shoulder pain (Bilateral) (L>R); Osteoarthritis of AC (acromioclavicular) joint (Right); Chronic sacroiliac joint pain (Bilateral) (L>R); Muscle spasticity; Cervical DDD (C4-5, C5-6, C6-7 and C7-T1); Cervical foraminal stenosis (Bilateral: C5-6 & C6-7, Left: C4-5 & C7-T1); Cervical radiculitis (Bilateral) (L>R); Cervical facet syndrome (Bilateral) (L>R); Cervical spondylosis; Musculoskeletal neck pain (trapezius) (Left); Chronic knee pain (Left); Grade 1 Retrolisthesis of L3 over L4; Lumbar facet arthropathy (Bilateral); Lumbar facet osteoarthritis; Lumbar facet syndrome (Bilateral) (R>L); Lumbar spondylosis; Chronic right shoulder pain; Suprascapular neuropathy, right; DDD (degenerative disc disease), lumbosacral; Lumbar facet hypertrophy (Multilevel) (Bilateral); Spondylosis without myelopathy or  radiculopathy, lumbosacral region; Inflammatory spondylopathy of lumbosacral region [facility]); Chronic lower extremity pain (Right); Chronic musculoskeletal pain; Cervicalgia (Bilateral) (L>R); Spondylosis without myelopathy or radiculopathy, cervical region; Myofascial pain syndrome of thoracic spine (serratus muscle) (Right); Chronic flank pain (Right); Chronic hip pain (Bilateral); Chronic hip pain (Right); Enthesopathy of hip region (Right); and Osteoarthritis of hip (Right) on their pertinent problem list. Pain Assessment: Severity of Chronic pain is reported as a 2 /10. Location: Back Lower, Right, Left/right side the worse, radiates buttocks bilateral and right hip. Onset: More than a month ago. Quality: Aching, Sharp, Nagging, Discomfort. Timing: Intermittent. Modifying factor(s): streching, tylenol, meds, rest. Vitals:  height is '5\' 7"'  (1.702 m) and weight is 181 lb (82.1 kg). His temperature is 97.3 F (36.3 C) (abnormal). His blood pressure is 100/64 and his pulse is 62. His respiration is 16.   Reason for encounter: medication management.   The patient indicates doing well with the current medication regimen. No adverse reactions or side effects reported to the medications.   The patient indicates that his right lower back pain has been returning and he is interested in repeating the radiofrequency ablation.  This would be his second.  The first 1 was done on 11/22/2019 and they provided him with 100% relief of the pain that has lasted until recently.  Clearly the procedure was extremely effective in controlling his low back pain and he would like to have that repeated again since he attained relief of his right sided low back pain for almost 2 years.  Today's physical exam was positive for exact reproduction of his right-sided low back pain upon hyperextension on rotation as well as Kemp maneuver on the right side for right-sided lumbar facet arthralgia.  Available imaging including an MRI of the  lumbar spine shows multilevel, bilateral lumbar facet arthropathy.  At the time of that MRI they could also observe reactive edema around the right  L4-5 facet joint.  A series of diagnostic lumbar facet blocks confirmed the diagnosis and radiofrequency ablation was performed, obtaining close to 2 years of pain relief.  Based on the above, today we will be scheduling the patient to return for a right-sided lumbar facet radiofrequency ablation under fluoroscopic guidance and IV sedation.  Today the patient also indicated still having pain on the right side of his neck.  On 03/13/2020 we performed a diagnostic right-sided cervical facet block without steroids which provided him with 100% relief of the pain for the duration of the local anesthetic.  In fact, this relief lasted past the duration of that local anesthetic and he did enjoy good relief of the pain for a while.  Unfortunately, his pain has returned and he is interested in doing the second diagnostic injection and if again turns out to be as effective as the first 1, he would like to consider radiofrequency ablation of the cervical facets.  He indicated that he would like to have this done after he has had his radiofrequency ablation of the lower lumbar facets.  RTCB: 01/30/2022 Nonopioids transferred 08/20/2020: Baclofen  Pharmacotherapy Assessment  Analgesic: Hydrocodone/APAP 5/325, 1 tab PO QD (5 mg/day of hydrocodone) MME/day: 5 mg/day.   Monitoring: [place] PMP: PDMP reviewed during this encounter.       Pharmacotherapy: No side-effects or adverse reactions reported. Compliance: No problems identified. Effectiveness: Clinically acceptable.  Ignatius Specking, RN  10/28/2021 11:52 AM  Sign when Signing Visit Nursing Pain Medication Assessment:  Safety precautions to be maintained throughout the outpatient stay will include: orient to surroundings, keep bed in low position, maintain call bell within reach at all times, provide assistance with  transfer out of bed and ambulation.  Medication Inspection Compliance: Pill count conducted under aseptic conditions, in front of the patient. Neither the pills nor the bottle was removed from the patient's sight at any time. Once count was completed pills were immediately returned to the patient in their original bottle.  Medication: Hydrocodone/APAP Pill/Patch Count:  13 of 30 pills remain Pill/Patch Appearance: Markings consistent with prescribed medication Bottle Appearance: Standard pharmacy container. Clearly labeled. Filled Date: 52 / 11 / 2022 Last Medication intake:  Yesterday    UDS:  Summary  Date Value Ref Range Status  04/29/2021 Note  Final    Comment:    ==================================================================== [employer] 13 (MW) ==================================================================== Test                             Result       Flag       Units  Drug Present and Declared for Prescription Verification   Hydrocodone                    499          EXPECTED   ng/mg creat   Dihydrocodeine                 152          EXPECTED   ng/mg creat   Norhydrocodone                 433          EXPECTED   ng/mg creat    Sources of hydrocodone include scheduled prescription medications.    Dihydrocodeine and norhydrocodone are expected metabolites of    hydrocodone. Dihydrocodeine is also available as a scheduled  prescription medication.  ==================================================================== Test                      Result    Flag   Units      Ref Range   Creatinine              328              mg/dL      >=20 ==================================================================== Declared Medications:  The flagging and interpretation on this report are based on the  following declared medications.  Unexpected results may arise from  inaccuracies in the declared medications.   **Note: The testing scope of this panel includes these  medications:   Hydrocodone (Norco)   **Note: The testing scope of this panel does not include the  following reported medications:   Acetaminophen (Tylenol)  Acetaminophen (Norco)  Aluminum hydroxide  Atenolol  Atorvastatin  Baclofen  Buspirone (Buspar)  Cholecalciferol  Chondroitin  Cyanocobalamin  Desloratadine  Diphenhydramine  Docusate  Duloxetine (Cymbalta)  Fenofibrate  Fluticasone (Flonase)  Glucosamine  Hydroxyzine  Ibuprofen  Levalbuterol  Lidocaine  Magnesium  Melatonin  Methylsulfonylmethane  Multivitamin  Omeprazole  Ondansetron  Potassium  Pseudoephedrine  Simethicone  Sucralfate (Carafate)  Supplement  Vitamin C  Vitamin E ==================================================================== For clinical consultation, please call [phone]. ====================================================================      ROS  Constitutional: Denies any fever or chills Gastrointestinal: No reported hemesis, hematochezia, vomiting, or acute GI distress Musculoskeletal: Denies any acute onset joint swelling, redness, loss of ROM, or weakness Neurological: No reported episodes of acute onset apraxia, aphasia, dysarthria, agnosia, amnesia, paralysis, loss of coordination, or loss of consciousness  Medication Review  Calcium Carbonate, DULoxetine, Elderberry, Glucosamine-Chondroit-Vit C-Mn, HYDROcodone-acetaminophen, Methylsulfonylmethane, Potassium, Vitamin D3, acetaminophen, atenolol, atorvastatin, baclofen, busPIRone, desloratadine-pseudoephedrine, diphenhydrAMINE HCl, docusate sodium, fenofibrate, fluticasone, hydrOXYzine, ibuprofen, levalbuterol, magnesium oxide, melatonin, multivitamin, omeprazole, ondansetron, simethicone, sucralfate, vitamin B-12, and vitamin E  History Review  Allergy: Jose Washington has No Known Allergies. Drug: Jose Washington  reports no history of drug use. Alcohol:  reports no history of alcohol use. Tobacco:  reports that he has  never smoked. He has never used smokeless tobacco. Social: Jose Washington  reports that he has never smoked. He has never used smokeless tobacco. He reports that he does not drink alcohol and does not use drugs. Medical:  has a past medical history of Allergy, Anxiety, Chronic duodenal ulcer with hemorrhage (2012), Chronic neck pain, Depression, GERD (gastroesophageal reflux disease), Hyperlipidemia, Hypertension, and Microscopic hematuria. Surgical: Jose Washington  has a past surgical history that includes eye muscle repair (1972 and 1975); bLEEDING ULCER (2012); Flexible sigmoidoscopy; Colonoscopy with propofol (N/A, 03/24/2021); and Esophagogastroduodenoscopy (egd) with propofol (N/A, 03/24/2021). Family: family history includes Allergies in his brother; Cancer in his maternal grandfather and mother; Dementia in his father and paternal grandfather; Depression in his father; Diabetes in his mother; Mental illness in his father; Stroke in his paternal grandfather.  Laboratory Chemistry Profile   Renal Lab Results  Component Value Date   BUN 18 06/23/2021   CREATININE 1.21 06/23/2021   BCR 15 06/23/2021   GFRAA 84 12/24/2020   GFRNONAA >60 02/08/2021    Hepatic Lab Results  Component Value Date   AST 36 06/23/2021   ALT 43 06/23/2021   ALBUMIN 4.6 06/23/2021   ALKPHOS 55 06/23/2021   LIPASE 28 02/08/2021    Electrolytes Lab Results  Component Value Date   NA 141 06/23/2021   K 4.3 06/23/2021  CL 103 06/23/2021   CALCIUM 9.6 06/23/2021   MG 2.0 11/12/2016    Bone Lab Results  Component Value Date   VD25OH 47.5 06/23/2021   25OHVITD1 23 (L) 11/12/2016   25OHVITD2 <1.0 11/12/2016   25OHVITD3 23 11/12/2016    Inflammation (CRP: Acute Phase) (ESR: Chronic Phase) Lab Results  Component Value Date   CRP 1.4 (H) 11/12/2016   ESRSEDRATE 3 11/12/2016         Note: Above Lab results reviewed.  Recent Imaging Review  CT ABDOMEN PELVIS W CONTRAST CLINICAL DATA:  55 year old male with  epigastric pain. History of peptic ulcer.  EXAM: CT ABDOMEN AND PELVIS WITH CONTRAST  TECHNIQUE: Multidetector CT imaging of the abdomen and pelvis was performed using the standard protocol following bolus administration of intravenous contrast.  CONTRAST:  131m OMNIPAQUE IOHEXOL 300 MG/ML  SOLN  COMPARISON:  CT abdomen pelvis dated 08/14/2019.  FINDINGS: Lower chest: The visualized lung bases are clear.  No intra-abdominal free air or free fluid.  Hepatobiliary: Fatty liver. No intrahepatic biliary dilatation. Multiple stomal stones noted in the gallbladder. No pericholecystic fluid or evidence of acute cholecystitis by CT.  Pancreas: Unremarkable. No pancreatic ductal dilatation or surrounding inflammatory changes.  Spleen: Normal in size without focal abnormality.  Adrenals/Urinary Tract: The adrenal glands unremarkable. There is no hydronephrosis on either side. There is symmetric enhancement and excretion of contrast by both kidneys. The visualized ureters and urinary bladder appear unremarkable.  Stomach/Bowel: There is moderate stool throughout the colon. There is no bowel obstruction or active inflammation. The appendix is normal.  Vascular/Lymphatic: Mild atherosclerotic calcification of the abdominal aorta. The IVC is unremarkable. No portal venous gas. There is no adenopathy.  Reproductive: The prostate and seminal vesicles are grossly unremarkable. No pelvic mass.  Other: Small fat containing right inguinal and umbilical hernias. No bowel herniation or fluid collection.  Musculoskeletal: Lower lumbar facet arthropathy. No acute osseous pathology.  IMPRESSION: 1. No acute intra-abdominal or pelvic pathology. 2. Cholelithiasis. 3. Fatty liver. 4. Moderate colonic stool burden. No bowel obstruction. Normal appendix. 5. Aortic Atherosclerosis (ICD10-I70.0).  Electronically Signed   By: AAnner CreteM.D.   On: 02/08/2021 20:16 Note: Reviewed         Physical Exam  General appearance: Well nourished, well developed, and well hydrated. In no apparent acute distress Mental status: Alert, oriented x 3 (person, place, & time)       Respiratory: No evidence of acute respiratory distress Eyes: PERLA Vitals: BP 100/64    Pulse 62    Temp (!) 97.3 F (36.3 C)    Resp 16    Ht '5\' 7"'  (1.702 m)    Wt 181 lb (82.1 kg)    BMI 28.35 kg/m  BMI: Estimated body mass index is 28.35 kg/m as calculated from the following:   Height as of this encounter: '5\' 7"'  (1.702 m).   Weight as of this encounter: 181 lb (82.1 kg). Ideal: Ideal body weight: 66.1 kg (145 lb 11.6 oz) Adjusted ideal body weight: 72.5 kg (159 lb 13.4 oz)  Assessment   Status Diagnosis  Controlled Controlled Controlled 1. Chronic pain syndrome   2. Chronic low back pain (1ry area of Pain) (Bilateral) (L>R) w/o sciatica    3. Lumbar facet syndrome (Bilateral) (R>L)   4. Lumbar facet hypertrophy (Multilevel) (Bilateral)   5. Chronic lower extremity pain (2ry area of Pain) (Left)   6. Chronic neck pain (3ry area of Pain) (Bilateral) (L>R)   7. Cervical  facet syndrome (Bilateral) (L>R)   8. Pharmacologic therapy   9. Opiate use (5 MME/Day)   10. Chronic use of opiate for therapeutic purpose   11. Encounter for medication management      Updated Problems: No problems updated.  Plan of Care  Problem-specific:  No problem-specific Assessment & Plan notes found for this encounter.  Jose Washington has a current medication list which includes the following long-term medication(s): atenolol, atorvastatin, baclofen, calcium carbonate, diphenhydramine hcl, duloxetine, fenofibrate, [START ON 11/01/2021] hydrocodone-acetaminophen, [START ON 12/01/2021] hydrocodone-acetaminophen, [START ON 12/31/2021] hydrocodone-acetaminophen, levalbuterol, omeprazole, potassium, simethicone, and sucralfate.  Pharmacotherapy (Medications Ordered): Meds ordered this encounter  Medications    HYDROcodone-acetaminophen (NORCO/VICODIN) 5-325 MG tablet    Sig: Take 1 tablet by mouth daily as needed for severe pain. Must last 30 days.    Dispense:  30 tablet    Refill:  0    DO NOT: delete (not duplicate); no partial-fill (will deny script to complete), no refill request (F/U required). DISPENSE: 1 day early if closed on fill date. WARN: No CNS-depressants within 8 hrs of med.   HYDROcodone-acetaminophen (NORCO/VICODIN) 5-325 MG tablet    Sig: Take 1 tablet by mouth daily as needed for severe pain. Must last 30 days.    Dispense:  30 tablet    Refill:  0    DO NOT: delete (not duplicate); no partial-fill (will deny script to complete), no refill request (F/U required). DISPENSE: 1 day early if closed on fill date. WARN: No CNS-depressants within 8 hrs of med.   HYDROcodone-acetaminophen (NORCO/VICODIN) 5-325 MG tablet    Sig: Take 1 tablet by mouth daily as needed for severe pain. Must last 30 days.    Dispense:  30 tablet    Refill:  0    DO NOT: delete (not duplicate); no partial-fill (will deny script to complete), no refill request (F/U required). DISPENSE: 1 day early if closed on fill date. WARN: No CNS-depressants within 8 hrs of med.   Orders:  Orders Placed This Encounter  Procedures   Radiofrequency,Lumbar    Standing Status:   Future    Standing Expiration Date:   01/26/2022    Scheduling Instructions:     Side(s): Right-sided     Level: L3-4, L4-5, & L5-S1 Facets (L2, L3, L4, L5, & S1 Medial Branch Nerves)     Sedation: With Sedation.     Scheduling Timeframe: As soon as pre-approved    Order Specific Question:   Where will this procedure be performed?    Answer:   [facility]   Follow-up plan:   Return for (ECT) procedure: (R) L-FCT RFA #2, (Sed-anx).     Interventional Therapies  Risk   Complexity Considerations:   Estimated body mass index is 28.35 kg/m as calculated from the following:   Height as of this encounter: '5\' 7"'  (1.702 m).   Weight as of  this encounter: 181 lb (82.1 kg). Abnormal UDS (03/27/2020) & (08/20/20) (+) undisclosed carboxy-THC    Planned   Pending:   1.  Therapeutic right-sided lumbar facet RFA #2  2.  Diagnostic right-sided cervical facet block (w/o steroids) #2    Under consideration:   1.  Therapeutic right-sided lumbar facet RFA #2  2.  Diagnostic right-sided cervical facet block (w/o steroids) #2  3.  Possible therapeutic right-sided cervical facet RFA #1    Completed:   Diagnostic/therapeutic right IA hip inj. x1 (12/09/2020) (100/100/95/90-95)  Therapeutic right lumbar facet RFA x1 (11/22/2019) (  100/100/100/90-100)  Palliative right lumbar facet MBB x2 (06/07/2019) (100/100/100 x20 days/>75 x3 months)  Diagnostic/therapeutic left lumbar facet MBB x1 (06/07/2019) (100/100/100 x20 days/>75 x3 months)  Diagnostic/therapeutic left L4-5 LESI x2 (07/25/2018) (n/a) Palliative left CESI x3 (02/16/2018) (100/100/100/100)  Diagnostic right cervical facet MBB x1 (03/13/2020) (100/100/100) Diagnostic left cervical facet MBB x1 (01/10/2020) (100/100/100)   Therapeutic   Palliative (PRN) options:   Palliative right lumbar facet RFA #2  Palliative right lumbar facet MBB #3  Diagnostic/therapeutic left lumbar facet MBB #2  Diagnostic/therapeutic left L4-5 LESI #3  Palliative left CESI #4  Diagnostic right-sided cervical facet MBB #2  Diagnostic left-sided cervical facet MBB #2     Recent Visits No visits were found meeting these conditions. Showing recent visits within past 90 days and meeting all other requirements Today's Visits Date Type Provider Dept  10/28/21 Office Visit Milinda Pointer, MD [facility]  Showing today's visits and meeting all other requirements Future Appointments Date Type Provider Dept  01/25/22 Appointment Milinda Pointer, MD [facility]  Showing future appointments within next 90 days and meeting all other requirements  I discussed the assessment and treatment  plan with the patient. The patient was provided an opportunity to ask questions and all were answered. The patient agreed with the plan and demonstrated an understanding of the instructions.  Patient advised to call back or seek an in-person evaluation if the symptoms or condition worsens.  Duration of encounter: 30 minutes.  Note by: Gaspar Cola, MD Date: 10/28/2021; Time: 12:33 PM

## 2021-10-28 ENCOUNTER — Encounter: Payer: Self-pay | Admitting: Pain Medicine

## 2021-10-28 ENCOUNTER — Ambulatory Visit: Payer: No Typology Code available for payment source | Attending: Pain Medicine | Admitting: Pain Medicine

## 2021-10-28 ENCOUNTER — Other Ambulatory Visit: Payer: Self-pay

## 2021-10-28 VITALS — BP 100/64 | HR 62 | Temp 97.3°F | Resp 16 | Ht 67.0 in | Wt 181.0 lb

## 2021-10-28 DIAGNOSIS — F1190 Opioid use, unspecified, uncomplicated: Secondary | ICD-10-CM | POA: Diagnosis present

## 2021-10-28 DIAGNOSIS — Z79891 Long term (current) use of opiate analgesic: Secondary | ICD-10-CM | POA: Insufficient documentation

## 2021-10-28 DIAGNOSIS — M545 Low back pain, unspecified: Secondary | ICD-10-CM | POA: Insufficient documentation

## 2021-10-28 DIAGNOSIS — M47812 Spondylosis without myelopathy or radiculopathy, cervical region: Secondary | ICD-10-CM | POA: Insufficient documentation

## 2021-10-28 DIAGNOSIS — Z79899 Other long term (current) drug therapy: Secondary | ICD-10-CM | POA: Diagnosis present

## 2021-10-28 DIAGNOSIS — M47816 Spondylosis without myelopathy or radiculopathy, lumbar region: Secondary | ICD-10-CM | POA: Insufficient documentation

## 2021-10-28 DIAGNOSIS — G894 Chronic pain syndrome: Secondary | ICD-10-CM | POA: Diagnosis present

## 2021-10-28 DIAGNOSIS — G8929 Other chronic pain: Secondary | ICD-10-CM | POA: Insufficient documentation

## 2021-10-28 DIAGNOSIS — M79605 Pain in left leg: Secondary | ICD-10-CM | POA: Diagnosis present

## 2021-10-28 DIAGNOSIS — M542 Cervicalgia: Secondary | ICD-10-CM | POA: Diagnosis present

## 2021-10-28 MED ORDER — HYDROCODONE-ACETAMINOPHEN 5-325 MG PO TABS: 1.0000 | ORAL_TABLET | Freq: Every day | ORAL | 0 refills | Status: DC | PRN

## 2021-10-28 NOTE — Progress Notes (Signed)
Nursing Pain Medication Assessment:  Safety precautions to be maintained throughout the outpatient stay will include: orient to surroundings, keep bed in low position, maintain call bell within reach at all times, provide assistance with transfer out of bed and ambulation.  Medication Inspection Compliance: Pill count conducted under aseptic conditions, in front of the patient. Neither the pills nor the bottle was removed from the patient's sight at any time. Once count was completed pills were immediately returned to the patient in their original bottle.  Medication: Hydrocodone/APAP Pill/Patch Count:  13 of 30 pills remain Pill/Patch Appearance: Markings consistent with prescribed medication Bottle Appearance: Standard pharmacy container. Clearly labeled. Filled Date: 71 / 11 / 2022 Last Medication intake:  Yesterday

## 2021-10-28 NOTE — Patient Instructions (Addendum)
______________________________________________________________________ ° °Preparing for Procedure with Sedation ° °NOTICE: Due to recent regulatory changes, starting on June 01, 2021, procedures requiring intravenous (IV) sedation will no longer be performed at the [facility].  These types of procedures are required to be performed at [facility] ambulatory surgery facility.  We are very sorry for the inconvenience. ° °Procedure appointments are limited to planned procedures: °No Prescription Refills. °No disability issues will be discussed. °No medication changes will be discussed. ° °Instructions: °Oral Intake: Do not eat or drink anything for at least 8 hours prior to your procedure. (Exception: Blood Pressure Medication. See below.) °Transportation: A driver is required. You may not drive yourself after the procedure. °Blood Pressure Medicine: Do not forget to take your blood pressure medicine with a sip of water the morning of the procedure. If your Diastolic (lower reading) is above 100 mmHg, elective cases will be cancelled/rescheduled. °Blood thinners: These will need to be stopped for procedures. Notify our staff if you are taking any blood thinners. Depending on which one you take, there will be specific instructions on how and when to stop it. °Diabetics on insulin: Notify the staff so that you can be scheduled 1st case in the morning. If your diabetes requires high dose insulin, take only ½ of your normal insulin dose the morning of the procedure and notify the staff that you have done so. °Preventing infections: Shower with an antibacterial soap the morning of your procedure. °Build-up your immune system: Take 1000 mg of Vitamin C with every meal (3 times a day) the day prior to your procedure. °Antibiotics: Inform the staff if you have a condition or reason that requires you to take antibiotics before dental procedures. °Pregnancy: If you are pregnant, call and cancel the procedure. °Sickness: If  you have a cold, fever, or any active infections, call and cancel the procedure. °Arrival: You must be in the facility at least 30 minutes prior to your scheduled procedure. °Children: Do not bring children with you. °Dress appropriately: Bring dark clothing that you would not mind if they get stained. °Valuables: Do not bring any jewelry or valuables. ° °Reasons to call and reschedule or cancel your procedure: (Following these recommendations will minimize the risk of a serious complication.) °Surgeries: Avoid having procedures within 2 weeks of any surgery. (Avoid for 2 weeks before or after any surgery). °Flu Shots: Avoid having procedures within 2 weeks of a flu shots. (Avoid for 2 weeks before or after immunizations). °Barium: Avoid having a procedure within 7-10 days after having had a radiological study involving the use of radiological contrast. (Myelograms, Barium swallow or enema study). °Heart attacks: Avoid any elective procedures or surgeries for the initial 6 months after a "Myocardial Infarction" (Heart Attack). °Blood thinners: It is imperative that you stop these medications before procedures. Let us know if you if you take any blood thinner.  °Infection: Avoid procedures during or within two weeks of an infection (including chest colds or gastrointestinal problems). Symptoms associated with infections include: Localized redness, fever, chills, night sweats or profuse sweating, burning sensation when voiding, cough, congestion, stuffiness, runny nose, sore throat, diarrhea, nausea, vomiting, cold or Flu symptoms, recent or current infections. It is specially important if the infection is over the area that we intend to treat. °Heart and lung problems: Symptoms that may suggest an active cardiopulmonary problem include: cough, chest pain, breathing difficulties or shortness of breath, dizziness, ankle swelling, uncontrolled high or unusually low blood pressure, and/or palpitations. If you are    experiencing any of these symptoms, cancel your procedure and contact your primary care physician for an evaluation. ° °Remember:  °Regular Business hours are:  °Monday to Thursday 8:00 AM to 4:00 PM ° °Provider's Schedule: °Francisco Naveira, MD:  °Procedure days: Tuesday and Thursday 7:30 AM to 4:00 PM ° °Bilal Lateef, MD:  °Procedure days: Monday and Wednesday 7:30 AM to 4:00 PM °______________________________________________________________________ ° ____________________________________________________________________________________________ ° °General Risks and Possible Complications ° °Patient Responsibilities: It is important that you read this as it is part of your informed consent. It is our duty to inform you of the risks and possible complications associated with treatments offered to you. It is your responsibility as a patient to read this and to ask questions about anything that is not clear or that you believe was not covered in this document. ° °Patient’s Rights: You have the right to refuse treatment. You also have the right to change your mind, even after initially having agreed to have the treatment done. However, under this last option, if you wait until the last second to change your mind, you may be charged for the materials used up to that point. ° °Introduction: Medicine is not an exact science. Everything in Medicine, including the lack of treatment(s), carries the potential for danger, harm, or loss (which is by definition: Risk). In Medicine, a complication is a secondary problem, condition, or disease that can aggravate an already existing one. All treatments carry the risk of possible complications. The fact that a side effects or complications occurs, does not imply that the treatment was conducted incorrectly. It must be clearly understood that these can happen even when everything is done following the highest safety standards. ° °No treatment: You can choose not to proceed with the  proposed treatment alternative. The “PRO(s)” would include: avoiding the risk of complications associated with the therapy. The “CON(s)” would include: not getting any of the treatment benefits. These benefits fall under one of three categories: diagnostic; therapeutic; and/or palliative. Diagnostic benefits include: getting information which can ultimately lead to improvement of the disease or symptom(s). Therapeutic benefits are those associated with the successful treatment of the disease. Finally, palliative benefits are those related to the decrease of the primary symptoms, without necessarily curing the condition (example: decreasing the pain from a flare-up of a chronic condition, such as incurable terminal cancer). ° °General Risks and Complications: These are associated to most interventional treatments. They can occur alone, or in combination. They fall under one of the following six (6) categories: no benefit or worsening of symptoms; bleeding; infection; nerve damage; allergic reactions; and/or death. °No benefits or worsening of symptoms: In Medicine there are no guarantees, only probabilities. No healthcare provider can ever guarantee that a medical treatment will work, they can only state the probability that it may. Furthermore, there is always the possibility that the condition may worsen, either directly, or indirectly, as a consequence of the treatment. °Bleeding: This is more common if the patient is taking a blood thinner, either prescription or over the counter (example: Goody Powders, Fish oil, Aspirin, Garlic, etc.), or if suffering a condition associated with impaired coagulation (example: Hemophilia, cirrhosis of the liver, low platelet counts, etc.). However, even if you do not have one on these, it can still happen. If you have any of these conditions, or take one of these drugs, make sure to notify your treating physician. °Infection: This is more common in patients with a compromised  immune system, either due to disease (example:   diabetes, cancer, human immunodeficiency virus [HIV], etc.), or due to medications or treatments (example: therapies used to treat cancer and rheumatological diseases). However, even if you do not have one on these, it can still happen. If you have any of these conditions, or take one of these drugs, make sure to notify your treating physician. Nerve Damage: This is more common when the treatment is an invasive one, but it can also happen with the use of medications, such as those used in the treatment of cancer. The damage can occur to small secondary nerves, or to large primary ones, such as those in the spinal cord and brain. This damage may be temporary or permanent and it may lead to impairments that can range from temporary numbness to permanent paralysis and/or brain death. Allergic Reactions: Any time a substance or material comes in contact with our body, there is the possibility of an allergic reaction. These can range from a mild skin rash (contact dermatitis) to a severe systemic reaction (anaphylactic reaction), which can result in death. Death: In general, any medical intervention can result in death, most of the time due to an unforeseen complication. ____________________________________________________________________________________________ Radiofrequency Ablation Radiofrequency ablation is a procedure that is performed to relieve pain. The procedure is often used for back, neck, or arm pain. Radiofrequency ablation involves the use of a machine that creates radio waves to make heat. During the procedure, the heat is applied to the nerve that carries the pain signal. The heat damages the nerve and interferes with the pain signal. Pain relief usually starts about 2 weeks after the procedure and lasts for 6 months to 1 year. Tell a health care provider about: Any allergies you have. All medicines you are taking, including vitamins, herbs, eye  drops, creams, and over-the-counter medicines. Any problems you or family members have had with anesthetic medicines. Any bleeding problems you have. Any surgeries you have had. Any medical conditions you have. Whether you are pregnant or may be pregnant. What are the risks? Generally, this is a safe procedure. However, problems may occur, including: Pain or soreness at the injection site. Allergic reaction to medicines given during the procedure. Bleeding. Infection at the injection site. Damage to nerves or blood vessels. What happens before the procedure? When to stop eating and drinking Follow instructions from your health care provider about what you may eat and drink before your procedure. These may include: 8 hours before the procedure Stop eating most foods. Do not eat meat, fried foods, or fatty foods. Eat only light foods, such as toast or crackers. All liquids are okay except energy drinks and alcohol. 6 hours before the procedure Stop eating. Drink only clear liquids, such as water, clear fruit juice, black coffee, plain tea, and sports drinks. Do not drink energy drinks or alcohol. 2 hours before the procedure Stop drinking all liquids. You may be allowed to take medicine with small sips of water. If you do not follow your health care provider's instructions, your procedure may be delayed or canceled. Medicines Ask your health care provider about: Changing or stopping your regular medicines. This is especially important if you are taking diabetes medicines or blood thinners. Taking medicines such as aspirin and ibuprofen. These medicines can thin your blood. Do not take these medicines unless your health care provider tells you to take them. Taking over-the-counter medicines, vitamins, herbs, and supplements. General instructions Ask your health care provider what steps will be taken to help prevent infection. These steps may include:  Removing hair at the procedure  site. Washing skin with a germ-killing soap. Taking antibiotic medicine. If you will be going home right after the procedure, plan to have a responsible adult: Take you home from the hospital or clinic. You will not be allowed to drive. Care for you for the time you are told. What happens during the procedure?  You will be awake during the procedure. You will need to be able to talk with the health care provider during the procedure. An IV will be inserted into one of your veins. You will be given one or more of the following: A medicine to help you relax (sedative). A medicine to numb the area (local anesthetic). Your health care provider will insert a radiofrequency needle into the area to be treated. This is done with the help of fluoroscopy. A wire that carries the radio waves (electrode) will be put through the radiofrequency needle. An electrical pulse will be sent through the electrode to verify the correct nerve that is causing your pain. You will feel a tingling sensation, and you may have muscle twitching. The tissue around the needle tip will be heated by an electric current that comes from the radiofrequency machine. This will numb the nerves. The needle will be removed. A bandage (dressing) will be put on the insertion area. The procedure may vary among health care providers and hospitals. What happens after the procedure? Your blood pressure, heart rate, breathing rate, and blood oxygen level will be monitored until you leave the hospital or clinic. Return to your normal activities as told by your health care provider. Ask your health care provider what activities are safe for you. If you were given a sedative during the procedure, it can affect you for several hours. Do not drive or operate machinery until your health care provider says that it is safe. Summary Radiofrequency ablation is a procedure that is performed to relieve pain. The procedure is often used for back, neck,  or arm pain. Radiofrequency ablation involves the use of a machine that creates radio waves to make heat. Plan to have a responsible adult take you home from the hospital or clinic. Do not drive or operate machinery until your health care provider says that it is safe. Return to your normal activities as told by your health care provider. Ask your health care provider what activities are safe for you. This information is not intended to replace advice given to you by your health care provider. Make sure you discuss any questions you have with your health care provider. Document Revised: 04/07/2021 Document Reviewed: 04/07/2021 [employer] Patient Education  [place].

## 2021-11-17 ENCOUNTER — Ambulatory Visit: Payer: No Typology Code available for payment source | Admitting: Pain Medicine

## 2021-11-25 NOTE — Progress Notes (Signed)
PROVIDER NOTE: Interpretation of information contained herein should be left to medically-trained personnel. Specific patient instructions are provided elsewhere under "Patient Instructions" section of medical record. This document was created in part using STT-dictation technology, any transcriptional errors that may result from this process are unintentional.  Patient: Jose Washington Type: Established DOB: 08-28-1966 MRN: [id number] PCP: Jon Billings, NP  Service: Procedure DOS: 11/26/2021 Setting: Ambulatory Location: Ambulatory outpatient facility Delivery: Face-to-face Provider: Gaspar Cola, MD Specialty: Interventional Pain Management Specialty designation: 09 Location: Outpatient facility Ref. Prov.: Milinda Pointer, MD    Primary Reason for Visit: Interventional Pain Management Treatment. CC: Back Pain (lower)  Procedure:           Type: Lumbar Facet, Medial Branch Radiofrequency Ablation (RFA) #2  Laterality: Right  Level: L2, L3, L4, L5, & S1 Medial Branch Level(s). These levels will denervate the L3-4, L4-5 and L5-S1 lumbar facet joints.  Imaging: Fluoroscopic guidance Anesthesia: Local anesthesia (1-2% Lidocaine) Anxiolysis: IV Versed         Sedation: Moderate conscious sedation. DOS: 11/26/2021  Performed by: Gaspar Cola, MD  Purpose: Therapeutic/Palliative Indications: Low back pain severe enough to impact quality of life or function. Indications: 1. Lumbar facet syndrome (Bilateral) (R>L)   2. Spondylosis without myelopathy or radiculopathy, lumbosacral region   3. Lumbar facet hypertrophy (Multilevel) (Bilateral)   4. Lumbar facet arthropathy (Bilateral)   5. Grade 1 Retrolisthesis of L3 over L4   6. DDD (degenerative disc disease), lumbosacral   7. Chronic low back pain (1ry area of Pain) (Bilateral) (L>R) w/o sciatica     Mr. Sigmund has been dealing with the above chronic pain for longer than three months and has either failed to  respond, was unable to tolerate, or simply did not get enough benefit from other more conservative therapies including, but not limited to: 1. Over-the-counter medications 2. Anti-inflammatory medications 3. Muscle relaxants 4. Membrane stabilizers 5. Opioids 6. Physical therapy and/or chiropractic manipulation 7. Modalities (Heat, ice, etc.) 8. Invasive techniques such as nerve blocks. Mr. Muscat has attained more than 50% relief of the pain from a series of diagnostic injections conducted in separate occasions.  Pain Score: Pre-procedure: 2 /10 Post-procedure: 0-No pain/10     Position / Prep / Materials:  Position: Prone  Prep solution: DuraPrep (Iodine Povacrylex [0.7% available iodine] and Isopropyl Alcohol, 74% w/w) Prep Area: Entire Lumbosacral Region (Lower back from mid-thoracic region to end of tailbone and from flank to flank.) Materials:  Tray: RFA (Radiofrequency) tray Needle(s):  Type: RFA (Teflon-coated radiofrequency ablation needles) Gauge (G): 22  Length: Regular (10cm) Qty: 5  Pre-op H&P Assessment:  Mr. Pine is a 56 y.o. (year old), male patient, seen today for interventional treatment. He  has a past surgical history that includes eye muscle repair (1972 and 1975); bLEEDING ULCER (2012); Flexible sigmoidoscopy; Colonoscopy with propofol (N/A, 03/24/2021); and Esophagogastroduodenoscopy (egd) with propofol (N/A, 03/24/2021). Mr. Harig has a current medication list which includes the following prescription(s): acetaminophen, atenolol, atorvastatin, baclofen, buspirone, calcium carbonate, vitamin d3, desloratadine-pseudoephedrine, diphenhydramine hcl, docusate sodium, duloxetine, elderberry, fenofibrate, fluticasone, glucosamine-chondroit-vit c-mn, hydrocodone-acetaminophen, [START ON 12/01/2021] hydrocodone-acetaminophen, [START ON 12/31/2021] hydrocodone-acetaminophen, hydroxyzine, ibuprofen, levalbuterol, magnesium oxide, melatonin, methylsulfonylmethane,  multivitamin, omeprazole, ondansetron, oxycodone-acetaminophen, [START ON 12/03/2021] oxycodone-acetaminophen, potassium, simethicone, sucralfate, vitamin b-12, and vitamin e, and the following Facility-Administered Medications: pentafluoroprop-tetrafluoroeth. His primarily concern today is the Back Pain (lower)  Initial Vital Signs:  Pulse/HCG Rate: (!) 55ECG Heart Rate: 60 Temp: 97.9 F (36.6 C) Resp: 20 BP: 117/74  SpO2: 100 %  BMI: Estimated body mass index is 28.35 kg/m as calculated from the following:   Height as of this encounter: 5\' 7"  (1.702 m).   Weight as of this encounter: 181 lb (82.1 kg).  Risk Assessment: Allergies: Reviewed. He has No Known Allergies.  Allergy Precautions: None required Coagulopathies: Reviewed. None identified.  Blood-thinner therapy: None at this time Active Infection(s): Reviewed. None identified. Mr. Alcocer is afebrile  Site Confirmation: Mr. Navis was asked to confirm the procedure and laterality before marking the site Procedure checklist: Completed Consent: Before the procedure and under the influence of no sedative(s), amnesic(s), or anxiolytics, the patient was informed of the treatment options, risks and possible complications. To fulfill our ethical and legal obligations, as recommended by the American Medical Association's Code of Ethics, I have informed the patient of my clinical impression; the nature and purpose of the treatment or procedure; the risks, benefits, and possible complications of the intervention; the alternatives, including doing nothing; the risk(s) and benefit(s) of the alternative treatment(s) or procedure(s); and the risk(s) and benefit(s) of doing nothing. The patient was provided information about the general risks and possible complications associated with the procedure. These may include, but are not limited to: failure to achieve desired goals, infection, bleeding, organ or nerve damage, allergic reactions, paralysis,  and death. In addition, the patient was informed of those risks and complications associated to Spine-related procedures, such as failure to decrease pain; infection (i.e.: Meningitis, epidural or intraspinal abscess); bleeding (i.e.: epidural hematoma, subarachnoid hemorrhage, or any other type of intraspinal or peri-dural bleeding); organ or nerve damage (i.e.: Any type of peripheral nerve, nerve root, or spinal cord injury) with subsequent damage to sensory, motor, and/or autonomic systems, resulting in permanent pain, numbness, and/or weakness of one or several areas of the body; allergic reactions; (i.e.: anaphylactic reaction); and/or death. Furthermore, the patient was informed of those risks and complications associated with the medications. These include, but are not limited to: allergic reactions (i.e.: anaphylactic or anaphylactoid reaction(s)); adrenal axis suppression; blood sugar elevation that in diabetics may result in ketoacidosis or comma; water retention that in patients with history of congestive heart failure may result in shortness of breath, pulmonary edema, and decompensation with resultant heart failure; weight gain; swelling or edema; medication-induced neural toxicity; particulate matter embolism and blood vessel occlusion with resultant organ, and/or nervous system infarction; and/or aseptic necrosis of one or more joints. Finally, the patient was informed that Medicine is not an exact science; therefore, there is also the possibility of unforeseen or unpredictable risks and/or possible complications that may result in a catastrophic outcome. The patient indicated having understood very clearly. We have given the patient no guarantees and we have made no promises. Enough time was given to the patient to ask questions, all of which were answered to the patient's satisfaction. Mr. Piet has indicated that he wanted to continue with the procedure. Attestation: I, the ordering provider,  attest that I have discussed with the patient the benefits, risks, side-effects, alternatives, likelihood of achieving goals, and potential problems during recovery for the procedure that I have provided informed consent. Date   Time: 11/26/2021  8:40 AM  Pre-Procedure Preparation:  Monitoring: As per clinic protocol. Respiration, ETCO2, SpO2, BP, heart rate and rhythm monitor placed and checked for adequate function Safety Precautions: Patient was assessed for positional comfort and pressure points before starting the procedure. Time-out: I initiated and conducted the "Time-out" before starting the procedure, as per protocol. The patient  was asked to participate by confirming the accuracy of the "Time Out" information. Verification of the correct person, site, and procedure were performed and confirmed by me, the nursing staff, and the patient. "Time-out" conducted as per Joint Commission's Universal Protocol (UP.01.01.01). Time: 0924  Description of Procedure:          Laterality: Right Levels:  L2, L3, L4, L5, & S1 Medial Branch Level(s), at the L3-4 and L5-S1 lumbar facet joints. Safety Precautions: Aspiration looking for blood return was conducted prior to all injections. At no point did we inject any substances, as a needle was being advanced. Before injecting, the patient was told to immediately notify me if he was experiencing any new onset of "ringing in the ears, or metallic taste in the mouth". No attempts were made at seeking any paresthesias. Safe injection practices and needle disposal techniques used. Medications properly checked for expiration dates. SDV (single dose vial) medications used. After the completion of the procedure, all disposable equipment used was discarded in the proper designated medical waste containers. Local Anesthesia: Protocol guidelines were followed. The patient was positioned over the fluoroscopy table. The area was prepped in the usual manner. The time-out was  completed. The target area was identified using fluoroscopy. A 12-in long, straight, sterile hemostat was used with fluoroscopic guidance to locate the targets for each level blocked. Once located, the skin was marked with an approved surgical skin marker. Once all sites were marked, the skin (epidermis, dermis, and hypodermis), as well as deeper tissues (fat, connective tissue and muscle) were infiltrated with a small amount of a short-acting local anesthetic, loaded on a 10cc syringe with a 25G, 1.5-in  Needle. An appropriate amount of time was allowed for local anesthetics to take effect before proceeding to the next step. Technical description of process:  Radiofrequency Ablation (RFA) L2 Medial Branch Nerve RFA: The target area for the L2 medial branch is at the junction of the postero-lateral aspect of the superior articular process and the superior, posterior, and medial edge of the transverse process of L3. Under fluoroscopic guidance, a Radiofrequency needle was inserted until contact was made with os over the superior postero-lateral aspect of the pedicular shadow (target area). Sensory and motor testing was conducted to properly adjust the position of the needle. Once satisfactory placement of the needle was achieved, the numbing solution was slowly injected after negative aspiration for blood. 2.0 mL of the nerve block solution was injected without difficulty or complication. After waiting for at least 3 minutes, the ablation was performed. Once completed, the needle was removed intact. L3 Medial Branch Nerve RFA: The target area for the L3 medial branch is at the junction of the postero-lateral aspect of the superior articular process and the superior, posterior, and medial edge of the transverse process of L4. Under fluoroscopic guidance, a Radiofrequency needle was inserted until contact was made with os over the superior postero-lateral aspect of the pedicular shadow (target area). Sensory and  motor testing was conducted to properly adjust the position of the needle. Once satisfactory placement of the needle was achieved, the numbing solution was slowly injected after negative aspiration for blood. 2.0 mL of the nerve block solution was injected without difficulty or complication. After waiting for at least 3 minutes, the ablation was performed. Once completed, the needle was removed intact. L4 Medial Branch Nerve RFA: The target area for the L4 medial branch is at the junction of the postero-lateral aspect of the superior articular process and the  superior, posterior, and medial edge of the transverse process of L5. Under fluoroscopic guidance, a Radiofrequency needle was inserted until contact was made with os over the superior postero-lateral aspect of the pedicular shadow (target area). Sensory and motor testing was conducted to properly adjust the position of the needle. Once satisfactory placement of the needle was achieved, the numbing solution was slowly injected after negative aspiration for blood. 2.0 mL of the nerve block solution was injected without difficulty or complication. After waiting for at least 3 minutes, the ablation was performed. Once completed, the needle was removed intact. L5 Medial Branch Nerve RFA: The target area for the L5 medial branch is at the junction of the postero-lateral aspect of the superior articular process of S1 and the superior, posterior, and medial edge of the sacral ala. Under fluoroscopic guidance, a Radiofrequency needle was inserted until contact was made with os over the superior postero-lateral aspect of the pedicular shadow (target area). Sensory and motor testing was conducted to properly adjust the position of the needle. Once satisfactory placement of the needle was achieved, the numbing solution was slowly injected after negative aspiration for blood. 2.0 mL of the nerve block solution was injected without difficulty or complication. After  waiting for at least 3 minutes, the ablation was performed. Once completed, the needle was removed intact. S1 Medial Branch Nerve RFA: The target area for the S1 medial branch is located inferior to the junction of the S1 superior articular process and the L5 inferior articular process, posterior, inferior, and lateral to the 6 o'clock position of the L5-S1 facet joint, just superior to the S1 posterior foramen. Under fluoroscopic guidance, the Radiofrequency needle was advanced until contact was made with os over the Target area. Sensory and motor testing was conducted to properly adjust the position of the needle. Once satisfactory placement of the needle was achieved, the numbing solution was slowly injected after negative aspiration for blood. 2.0 mL of the nerve block solution was injected without difficulty or complication. After waiting for at least 3 minutes, the ablation was performed. Once completed, the needle was removed intact. Radiofrequency lesioning (ablation):  Radiofrequency Generator: Medtronic AccurianTM AG 1000 RF Generator Sensory Stimulation Parameters: 50 Hz was used to locate & identify the nerve, making sure that the needle was positioned such that there was no sensory stimulation below 0.3 V or above 0.7 V. Motor Stimulation Parameters: 2 Hz was used to evaluate the motor component. Care was taken not to lesion any nerves that demonstrated motor stimulation of the lower extremities at an output of less than 2.5 times that of the sensory threshold, or a maximum of 2.0 V. Lesioning Technique Parameters: Standard Radiofrequency settings. (Not bipolar or pulsed.) Temperature Settings: 80 degrees C Lesioning time: 60 seconds Intra-operative Compliance: Compliant  Once the entire procedure was completed, the treated area was cleaned, making sure to leave some of the prepping solution back to take advantage of its long term bactericidal properties.    Illustration of the posterior  view of the lumbar spine and the posterior neural structures. Laminae of L2 through S1 are labeled. DPRL5, dorsal primary ramus of L5; DPRS1, dorsal primary ramus of S1; DPR3, dorsal primary ramus of L3; FJ, facet (zygapophyseal) joint L3-L4; I, inferior articular process of L4; LB1, lateral branch of dorsal primary ramus of L1; IAB, inferior articular branches from L3 medial branch (supplies L4-L5 facet joint); IBP, intermediate branch plexus; MB3, medial branch of dorsal primary ramus of L3; NR3, third lumbar  nerve root; S, superior articular process of L5; SAB, superior articular branches from L4 (supplies L4-5 facet joint also); TP3, transverse process of L3.  Vitals:   11/26/21 0955 11/26/21 1007 11/26/21 1017 11/26/21 1027  BP: (!) 150/103 118/76 104/75 110/80  Pulse: 65     Resp: 16     Temp:      SpO2: 97% 97% 97% 98%  Weight:      Height:       Start Time: 0924 hrs. End Time: 0958 hrs.  Imaging Guidance (Spinal):          Type of Imaging Technique: Fluoroscopy Guidance (Spinal) Indication(s): Assistance in needle guidance and placement for procedures requiring needle placement in or near specific anatomical locations not easily accessible without such assistance. Exposure Time: Please see nurses notes. Contrast: None used. Fluoroscopic Guidance: I was personally present during the use of fluoroscopy. "Tunnel Vision Technique" used to obtain the best possible view of the target area. Parallax error corrected before commencing the procedure. "Direction-depth-direction" technique used to introduce the needle under continuous pulsed fluoroscopy. Once target was reached, antero-posterior, oblique, and lateral fluoroscopic projection used confirm needle placement in all planes. Images permanently stored in EMR. Interpretation: No contrast injected. I personally interpreted the imaging intraoperatively. Adequate needle placement confirmed in multiple planes. Permanent images saved into the  patient's record.  Antibiotic Prophylaxis:   Anti-infectives (From admission, onward)    None      Indication(s): None identified  Post-operative Assessment:  Post-procedure Vital Signs:  Pulse/HCG Rate: 65 (nsr)60 Temp: 97.9 F (36.6 C) Resp: 16 BP: 110/80 SpO2: 98 %  EBL: None  Complications: No immediate post-treatment complications observed by team, or reported by patient.  Note: The patient tolerated the entire procedure well. A repeat set of vitals were taken after the procedure and the patient was kept under observation following institutional policy, for this type of procedure. Post-procedural neurological assessment was performed, showing return to baseline, prior to discharge. The patient was provided with post-procedure discharge instructions, including a section on how to identify potential problems. Should any problems arise concerning this procedure, the patient was given instructions to immediately contact us, at any time, without hesitation. In any case, we plan to contact the patient by telephone for a follow-up status report regarding this interventional procedure.  Comments:  No additional relevant information.  Plan of Care  Orders:  Orders Placed This Encounter  Procedures   Radiofrequency,Lumbar    Scheduling Instructions:     Side(s): Right-sided     Level: L3-4, L4-5, & L5-S1 Facets (L2, L3, L4, L5, & S1 Medial Branch Nerves)     Sedation: Patient's choice.     Timeframe: Today    Order Specific Question:   Where will this procedure be performed?    Answer:   [facility]   DG PAIN [facility] C-ARM 1-60 MIN NO REPORT    Intraoperative interpretation by procedural physician at [place].    Standing Status:   Standing    Number of Occurrences:   1    Order Specific Question:   Reason for exam:    Answer:   Assistance in needle guidance and placement for procedures requiring needle placement in or near specific anatomical locations  not easily accessible without such assistance.   Informed Consent Details: Physician/Practitioner Attestation; Transcribe to consent form and obtain patient signature    Nursing Order: Transcribe to consent form and obtain patient signature. Note: Always confirm laterality of pain  with Mr. Merida, before procedure.    Order Specific Question:   Physician/Practitioner attestation of informed consent for procedure/surgical case    Answer:   I, the physician/practitioner, attest that I have discussed with the patient the benefits, risks, side effects, alternatives, likelihood of achieving goals and potential problems during recovery for the procedure that I have provided informed consent.    Order Specific Question:   Procedure    Answer:   Lumbar Facet Radiofrequency Ablation    Order Specific Question:   Physician/Practitioner performing the procedure    Answer:   Wilburt Messina A. Dossie Arbour, MD    Order Specific Question:   Indication/Reason    Answer:   Low Back Pain, with our without leg pain, due to Facet Joint Arthralgia (Joint Pain) known as Lumbar Facet Syndrome, secondary to Lumbar, and/or Lumbosacral Spondylosis (Arthritis of the Spine), without myelopathy or radiculopathy (Nerve Damage).   Provide equipment / supplies at bedside    "Radiofrequency Tray"; Large hemostat (x1); Small hemostat (x1); Towels (x8); 4x4 sterile sponge pack (x1) Needle type: Teflon-coated Radiofrequency Needle (Disposable   single use) Size: Regular Quantity: 5    Standing Status:   Standing    Number of Occurrences:   1    Order Specific Question:   Specify    Answer:   Radiofrequency Tray   Chronic Opioid Analgesic:  Hydrocodone/APAP 5/325, 1 tab PO QD (5 mg/day of hydrocodone) MME/day: 5 mg/day.   Medications ordered for procedure: Meds ordered this encounter  Medications   lidocaine (XYLOCAINE) 2 % (with pres) injection 400 mg   pentafluoroprop-tetrafluoroeth (GEBAUERS) aerosol   lactated ringers infusion  1,000 mL   midazolam (VERSED) 5 MG/5ML injection 0.5-2 mg    Make sure Flumazenil is available in the pyxis when using this medication. If oversedation occurs, administer 0.2 mg IV over 15 sec. If after 45 sec no response, administer 0.2 mg again over 1 min; may repeat at 1 min intervals; not to exceed 4 doses (1 mg)   ropivacaine (PF) 2 mg/mL (0.2%) (NAROPIN) injection 9 mL   triamcinolone acetonide (KENALOG-40) injection 40 mg   oxyCODONE-acetaminophen (PERCOCET) 5-325 MG tablet    Sig: Take 1 tablet by mouth every 6 (six) hours as needed for up to 7 days for severe pain. Must last 7 days.    Dispense:  28 tablet    Refill:  0    For acute post-operative pain. Not to be refilled. Most last 7 days.   oxyCODONE-acetaminophen (PERCOCET) 5-325 MG tablet    Sig: Take 1 tablet by mouth every 6 (six) hours as needed for up to 7 days for severe pain. Must last 7 days.    Dispense:  28 tablet    Refill:  0    For acute post-operative pain. Not to be refilled. Must last 7 days.   fentaNYL (SUBLIMAZE) injection 25-50 mcg    Make sure Narcan is available in the pyxis when using this medication. In the event of respiratory depression (RR< 8/min): Titrate NARCAN (naloxone) in increments of 0.1 to 0.2 mg IV at 2-3 minute intervals, until desired degree of reversal.   Medications administered: We administered lidocaine, lactated ringers, midazolam, ropivacaine (PF) 2 mg/mL (0.2%), triamcinolone acetonide, and fentaNYL.  See the medical record for exact dosing, route, and time of administration.  Follow-up plan:   Return in about 6 weeks (around 01/07/2022) for Proc-day (T,Th), (VV), (PPE).       Interventional Therapies  Risk   Complexity Considerations:  Estimated body mass index is 28.35 kg/m as calculated from the following:   Height as of this encounter: 5\' 7"  (1.702 m).   Weight as of this encounter: 181 lb (82.1 kg). Abnormal UDS (03/27/2020) & (08/20/20) (+) undisclosed carboxy-THC     Planned   Pending:   1.  Therapeutic right-sided lumbar facet RFA #2  2.  Diagnostic right-sided cervical facet block (w/o steroids) #2    Under consideration:   1.  Therapeutic right-sided lumbar facet RFA #2  2.  Diagnostic right-sided cervical facet block (w/o steroids) #2  3.  Possible therapeutic right-sided cervical facet RFA #1    Completed:   Diagnostic/therapeutic right IA hip inj. x1 (12/09/2020) (100/100/95/90-95)  Therapeutic right lumbar facet RFA x1 (11/22/2019) (100/100/100/90-100)  Palliative right lumbar facet MBB x2 (06/07/2019) (100/100/100 x20 days/>75 x3 months)  Diagnostic/therapeutic left lumbar facet MBB x1 (06/07/2019) (100/100/100 x20 days/>75 x3 months)  Diagnostic/therapeutic left L4-5 LESI x2 (07/25/2018) (n/a) Palliative left CESI x3 (02/16/2018) (100/100/100/100)  Diagnostic right cervical facet MBB x1 (03/13/2020) (100/100/100) Diagnostic left cervical facet MBB x1 (01/10/2020) (100/100/100)   Therapeutic   Palliative (PRN) options:   Palliative right lumbar facet RFA #2  Palliative right lumbar facet MBB #3  Diagnostic/therapeutic left lumbar facet MBB #2  Diagnostic/therapeutic left L4-5 LESI #3  Palliative left CESI #4  Diagnostic right-sided cervical facet MBB #2  Diagnostic left-sided cervical facet MBB #2     Recent Visits Date Type Provider Dept  10/28/21 Office Visit Milinda Pointer, MD [facility]  Showing recent visits within past 90 days and meeting all other requirements Today's Visits Date Type Provider Dept  11/26/21 Procedure visit Milinda Pointer, MD [facility]  Showing today's visits and meeting all other requirements Future Appointments Date Type Provider Dept  01/12/22 Appointment Milinda Pointer, MD [facility]  01/25/22 Appointment Milinda Pointer, MD [facility]  Showing future appointments within next 90 days and meeting all other requirements  Disposition: Discharge home   Discharge (Date   Time): 11/26/2021; 1035 hrs.   Primary Care Physician: Jon Billings, NP Location: [facility] Note by: Gaspar Cola, MD Date: 11/26/2021; Time: 11:15 AM  Disclaimer:  Medicine is not an Chief Strategy Officer. The only guarantee in medicine is that nothing is guaranteed. It is important to note that the decision to proceed with this intervention was based on the information collected from the patient. The Data and conclusions were drawn from the patient's questionnaire, the interview, and the physical examination. Because the information was provided in large part by the patient, it cannot be guaranteed that it has not been purposely or unconsciously manipulated. Every effort has been made to obtain as much relevant data as possible for this evaluation. It is important to note that the conclusions that lead to this procedure are derived in large part from the available data. Always take into account that the treatment will also be dependent on availability of resources and existing treatment guidelines, considered by other Pain Management Practitioners as being common knowledge and practice, at the time of the intervention. For Medico-Legal purposes, it is also important to point out that variation in procedural techniques and pharmacological choices are the acceptable norm. The indications, contraindications, technique, and results of the above procedure should only be interpreted and judged by a Board-Certified Interventional Pain Specialist with extensive familiarity and expertise in the same exact procedure and technique.

## 2021-11-26 ENCOUNTER — Other Ambulatory Visit: Payer: Self-pay

## 2021-11-26 ENCOUNTER — Ambulatory Visit
Admission: RE | Admit: 2021-11-26 | Discharge: 2021-11-26 | Disposition: A | Discharge status: Home / Self Care | Payer: No Typology Code available for payment source | Source: Ambulatory Visit | Attending: Pain Medicine | Admitting: Pain Medicine

## 2021-11-26 ENCOUNTER — Encounter: Payer: Self-pay | Admitting: Pain Medicine

## 2021-11-26 ENCOUNTER — Ambulatory Visit (HOSPITAL_BASED_OUTPATIENT_CLINIC_OR_DEPARTMENT_OTHER): Payer: No Typology Code available for payment source | Admitting: Pain Medicine

## 2021-11-26 VITALS — BP 110/80 | HR 65 | Temp 97.9°F | Resp 16 | Ht 67.0 in | Wt 181.0 lb

## 2021-11-26 DIAGNOSIS — M4310 Spondylolisthesis, site unspecified: Secondary | ICD-10-CM | POA: Diagnosis present

## 2021-11-26 DIAGNOSIS — G8918 Other acute postprocedural pain: Secondary | ICD-10-CM

## 2021-11-26 DIAGNOSIS — M47816 Spondylosis without myelopathy or radiculopathy, lumbar region: Secondary | ICD-10-CM | POA: Diagnosis not present

## 2021-11-26 DIAGNOSIS — M545 Low back pain, unspecified: Secondary | ICD-10-CM

## 2021-11-26 DIAGNOSIS — M5137 Other intervertebral disc degeneration, lumbosacral region: Secondary | ICD-10-CM | POA: Insufficient documentation

## 2021-11-26 DIAGNOSIS — M47817 Spondylosis without myelopathy or radiculopathy, lumbosacral region: Secondary | ICD-10-CM | POA: Diagnosis present

## 2021-11-26 DIAGNOSIS — G8929 Other chronic pain: Secondary | ICD-10-CM | POA: Diagnosis present

## 2021-11-26 MED ORDER — ONDANSETRON HCL 4 MG/2ML IJ SOLN
INTRAMUSCULAR | Status: AC
  Filled 2021-11-26: qty 2

## 2021-11-26 MED ORDER — IOHEXOL 180 MG/ML  SOLN
INTRAMUSCULAR | Status: AC
  Filled 2021-11-26: qty 10

## 2021-11-26 MED ORDER — LIDOCAINE HCL (PF) 2 % IJ SOLN
INTRAMUSCULAR | Status: AC
  Filled 2021-11-26: qty 5

## 2021-11-26 MED ORDER — SODIUM CHLORIDE (PF) 0.9 % IJ SOLN
INTRAMUSCULAR | Status: AC
  Filled 2021-11-26: qty 10

## 2021-11-26 MED ORDER — MIDAZOLAM HCL 5 MG/5ML IJ SOLN
0.5000 mg | Freq: Once | INTRAMUSCULAR | Status: AC
  Administered 2021-11-26 (×2): 1 mg via INTRAVENOUS

## 2021-11-26 MED ORDER — METHYLPREDNISOLONE ACETATE 40 MG/ML IJ SUSP
INTRAMUSCULAR | Status: AC
  Filled 2021-11-26: qty 1

## 2021-11-26 MED ORDER — TRIAMCINOLONE ACETONIDE 40 MG/ML IJ SUSP
INTRAMUSCULAR | Status: AC
  Filled 2021-11-26: qty 2

## 2021-11-26 MED ORDER — LACTATED RINGERS IV SOLN
1000.0000 mL | Freq: Once | INTRAVENOUS | Status: AC
  Administered 2021-11-26: 1000 mL via INTRAVENOUS

## 2021-11-26 MED ORDER — SODIUM CHLORIDE (PF) 0.9 % IJ SOLN
INTRAMUSCULAR | Status: AC
  Filled 2021-11-26: qty 20

## 2021-11-26 MED ORDER — FENTANYL CITRATE (PF) 100 MCG/2ML IJ SOLN
25.0000 ug | INTRAMUSCULAR | Status: AC | PRN
  Administered 2021-11-26: 25 ug via INTRAVENOUS
  Administered 2021-11-26: 50 ug via INTRAVENOUS

## 2021-11-26 MED ORDER — ROPIVACAINE HCL 2 MG/ML IJ SOLN
9.0000 mL | Freq: Once | INTRAMUSCULAR | Status: AC
  Administered 2021-11-26: 9 mL via PERINEURAL

## 2021-11-26 MED ORDER — DEXAMETHASONE SODIUM PHOSPHATE 10 MG/ML IJ SOLN
INTRAMUSCULAR | Status: AC
  Filled 2021-11-26: qty 1

## 2021-11-26 MED ORDER — ROPIVACAINE HCL 2 MG/ML IJ SOLN
INTRAMUSCULAR | Status: AC
  Filled 2021-11-26: qty 20

## 2021-11-26 MED ORDER — TRIAMCINOLONE ACETONIDE 40 MG/ML IJ SUSP
40.0000 mg | Freq: Once | INTRAMUSCULAR | Status: AC
  Administered 2021-11-26: 40 mg

## 2021-11-26 MED ORDER — OXYCODONE-ACETAMINOPHEN 5-325 MG PO TABS: 1.0000 | ORAL_TABLET | Freq: Four times a day (QID) | ORAL | 0 refills | Status: AC | PRN

## 2021-11-26 MED ORDER — PENTAFLUOROPROP-TETRAFLUOROETH EX AERO
INHALATION_SPRAY | Freq: Once | CUTANEOUS | Status: DC
  Filled 2021-11-26: qty 116

## 2021-11-26 MED ORDER — MIDAZOLAM HCL 5 MG/5ML IJ SOLN
INTRAMUSCULAR | Status: AC
  Filled 2021-11-26: qty 5

## 2021-11-26 MED ORDER — LIDOCAINE HCL 2 % IJ SOLN
20.0000 mL | Freq: Once | INTRAMUSCULAR | Status: AC
  Administered 2021-11-26: 200 mg

## 2021-11-26 MED ORDER — FENTANYL CITRATE (PF) 100 MCG/2ML IJ SOLN
INTRAMUSCULAR | Status: AC
  Filled 2021-11-26: qty 2

## 2021-11-26 NOTE — Patient Instructions (Signed)
____________________________________________________________________________________________ ? ?Virtual Visits  ? ?What is a "Virtual Visit"? ?It is a healthcare communication encounter (medical visit) that takes place on real time (NOT TEXT or E-MAIL) over the telephone or computer device (desktop, laptop, tablet, smart phone, etc.). It allows for more location flexibility between the patient and the healthcare provider. ? ?Who decides when these types of visits will be used? ?The physician. ? ?Who is eligible for these types of visits? ?Only those patients that can be reliably reached over the telephone. ? ?What do you mean by reliably? ?We do not have time to call everyone multiple times, therefore those that tend to screen calls and then call back later are not suitable candidates for this system. We understand how people are reluctant to pickup on "unknown" calls, therefore, we suggest adding our telephone numbers to your list of "CONTACT(s)". This way, you should be able to readily identify our calls when you receive one. All of our numbers are available below.  ? ?Who is not eligible? ?This option is not available for medication management encounters, specially for controlled substances. Patients on pain medications that fall under the category of controlled substances have to come in for "Face-to-Face" encounters. This is required for mandatory monitoring of these substances. You may be asked to provide a sample for an unannounced urine drug screening test (UDS), and we will need to count your pain pills. Not bringing your pills to be counted may result in no refill. Obviously, neither one of these can be done over the phone. ? ?When will this type of visits be used? ?You can request a virtual visit whenever you are physically unable to attend a regular appointment. The decision will be made by the physician (or healthcare provider) on a case by case basis.  ? ?At what time will I be called? ?This is an  excellent question. The providers will try to call you whenever they have time available. Do not expect to be called at any specific time. The secretaries will assign you a time for your virtual visit appointment, but this is done simply to keep a list of those patients that need to be called, but not for the purpose of keeping a time schedule. Be advised that the call may come in anytime during the day, between the hours of 8:00 AM and 8::00 PM, depending on provider availability. We do understand that the system is not perfect. If you are unable to be available that day on a moments notice, then request an "in-person" appointment rather than a "virtual visit". ? ?Can I request my medication visits to be "Virtual"? ?Yes you may request it, but the decision is entirely up to the healthcare provider. Control substances require specific monitoring that requires Face-to-Face encounters. The number of encounters  and the extent of the monitoring is determined on a case by case basis. ? ?Add a new contact to your smart phone and label it "PAIN CLINIC" ?Under this contact add the following numbers: ?Main: [phone] (Official Contact Number) ?Nurses: [phone] (These are outgoing only calling systems. Do not call this number.) ?Dr. Corley Kohls: [phone] or [phone] (Outgoing calls only. Do not call this number.) ? ?____________________________________________________________________________________________ ? ___________________________________________________________________________________________ ? ?Post-Radiofrequency (RF) Discharge Instructions ? ?You have just completed a Radiofrequency Neurotomy.  The following instructions will provide you with information and guidelines for self-care upon discharge.  If at any time you have questions or concerns please call your physician. ?DO NOT DRIVE YOURSELF!! ? ?Instructions: ?Apply   ice: Fill a plastic sandwich bag with crushed ice. Cover it with a small  towel and apply to injection site. Apply for 15 minutes then remove x 15 minutes. Repeat sequence on day of procedure, until you go to bed. The purpose is to minimize swelling and discomfort after procedure. ?Apply heat: Apply heat to procedure site starting the day following the procedure. The purpose is to treat any soreness and discomfort from the procedure. ?Food intake: No eating limitations, unless stipulated above.  Nevertheless, if you have had sedation, you may experience some nausea.  In this case, it may be wise to wait at least two hours prior to resuming regular diet. ?Physical activities: Keep activities to a minimum for the first 8 hours after the procedure. For the first 24 hours after the procedure, do not drive a motor vehicle,  Operate heavy machinery, power tools, or handle any weapons.  Consider walking with the use of an assistive device or accompanied by an adult for the first 24 hours.  Do not drink alcoholic beverages including beer.  Do not make any important decisions or sign any legal documents. Go home and rest today.  Resume activities tomorrow, as tolerated.  Use caution in moving about as you may experience mild leg weakness.  Use caution in cooking, use of household electrical appliances and climbing steps. ?Driving: If you have received any sedation, you are not allowed to drive for 24 hours after your procedure. ?Blood thinner: Restart your blood thinner 6 hours after your procedure. (Only for those taking blood thinners) ?Insulin: As soon as you can eat, you may resume your normal dosing schedule. (Only for those taking insulin) ?Medications: May resume pre-procedure medications.  Do not take any drugs, other than what has been prescribed to you. ?Infection prevention: Keep procedure site clean and dry. ?Post-procedure Pain Diary: Extremely important that this be done correctly and accurately. Recorded information will be used to determine the next step in treatment. ?Pain  evaluated is that of treated area only. Do not include pain from an untreated area. ?Complete every hour, on the hour, for the initial 8 hours. Set an alarm to help you do this part accurately. ?Do not go to sleep and have it completed later. It will not be accurate. ?Follow-up appointment: Keep your follow-up appointment after the procedure. Usually 2-6 weeks after radiofrequency. Bring you pain diary. The information collected will be essential for your long-term care.  ? ?Expect: ?From numbing medicine (AKA: Local Anesthetics): Numbness or decrease in pain. ?Onset: Full effect within 15 minutes of injected. ?Duration: It will depend on the type of local anesthetic used. On the average, 1 to 8 hours.  ?From steroids (when added): Decrease in swelling or inflammation. Once inflammation is improved, relief of the pain will follow. ?Onset of benefits: Depends on the amount of swelling present. The more swelling, the longer it will take for the benefits to be seen. In some cases, up to 10 days. ?Duration: Steroids will stay in the system x 2 weeks. Duration of benefits will depend on multiple posibilities including persistent irritating factors. ?From procedure: Some discomfort is to be expected once the numbing medicine wears off. In the case of radiofrequency procedures, this may last as long as 6 weeks. Additional post-procedure pain medication is provided for this. Discomfort is minimized if ice and heat are applied as instructed. ? ?Call if: ?You experience numbness and weakness that gets worse with time, as opposed to wearing off. ?He experience any unusual   bleeding, difficulty breathing, or loss of the ability to control your bowel and bladder. (This applies to Spinal procedures only) ?You experience any redness, swelling, heat, red streaks, elevated temperature, fever, or any other signs of a possible infection. ? ?Emergency Numbers: ?Durning business hours (Monday - Thursday, 8:00 AM - 4:00 PM) (Friday, 9:00  AM - 12:00 Noon): [phone] ?After hours: [phone] ?____________________________________________________________________________________________ ?  ?

## 2021-11-26 NOTE — Progress Notes (Signed)
Safety precautions to be maintained throughout the outpatient stay will include: orient to surroundings, keep bed in low position, maintain call bell within reach at all times, provide assistance with transfer out of bed and ambulation.  

## 2021-11-27 ENCOUNTER — Telehealth: Payer: Self-pay

## 2021-11-27 NOTE — Telephone Encounter (Signed)
Post procedure phone call. Patient states he is doing well.  

## 2021-12-25 ENCOUNTER — Other Ambulatory Visit: Payer: Self-pay

## 2021-12-25 ENCOUNTER — Ambulatory Visit (INDEPENDENT_AMBULATORY_CARE_PROVIDER_SITE_OTHER): Payer: No Typology Code available for payment source | Admitting: Nurse Practitioner

## 2021-12-25 ENCOUNTER — Encounter: Payer: Self-pay | Admitting: Nurse Practitioner

## 2021-12-25 ENCOUNTER — Other Ambulatory Visit: Payer: Self-pay | Admitting: Nurse Practitioner

## 2021-12-25 VITALS — BP 112/72 | HR 57 | Temp 98.8°F | Ht 66.5 in | Wt 179.8 lb

## 2021-12-25 DIAGNOSIS — Z0000 Encounter for general adult medical examination without abnormal findings: Secondary | ICD-10-CM | POA: Diagnosis not present

## 2021-12-25 DIAGNOSIS — I700 Atherosclerosis of aorta: Secondary | ICD-10-CM

## 2021-12-25 DIAGNOSIS — I10 Essential (primary) hypertension: Secondary | ICD-10-CM | POA: Diagnosis not present

## 2021-12-25 DIAGNOSIS — F325 Major depressive disorder, single episode, in full remission: Secondary | ICD-10-CM | POA: Diagnosis not present

## 2021-12-25 DIAGNOSIS — E785 Hyperlipidemia, unspecified: Secondary | ICD-10-CM

## 2021-12-25 DIAGNOSIS — M7918 Myalgia, other site: Secondary | ICD-10-CM

## 2021-12-25 DIAGNOSIS — G8929 Other chronic pain: Secondary | ICD-10-CM

## 2021-12-25 DIAGNOSIS — E559 Vitamin D deficiency, unspecified: Secondary | ICD-10-CM

## 2021-12-25 DIAGNOSIS — M62838 Other muscle spasm: Secondary | ICD-10-CM

## 2021-12-25 DIAGNOSIS — F419 Anxiety disorder, unspecified: Secondary | ICD-10-CM

## 2021-12-25 LAB — MICROSCOPIC EXAMINATION: WBC, UA: NONE SEEN /hpf (ref 0–5)

## 2021-12-25 LAB — URINALYSIS, ROUTINE W REFLEX MICROSCOPIC
Bilirubin, UA: NEGATIVE
Glucose, UA: NEGATIVE
Leukocytes,UA: NEGATIVE
Nitrite, UA: NEGATIVE
RBC, UA: NEGATIVE
Specific Gravity, UA: >1.03 — ABNORMAL HIGH (ref 1.005–1.030)
Urobilinogen, Ur: 0.2 mg/dL (ref 0.2–1.0)
pH, UA: 5.5 (ref 5.0–7.5)

## 2021-12-25 MED ORDER — DULOXETINE HCL 30 MG PO CPEP: 30.0000 mg | ORAL_CAPSULE | Freq: Two times a day (BID) | ORAL | 1 refills | Status: DC

## 2021-12-25 MED ORDER — BUSPIRONE HCL 30 MG PO TABS: 30.0000 mg | ORAL_TABLET | Freq: Two times a day (BID) | ORAL | 1 refills | Status: DC

## 2021-12-25 MED ORDER — ATENOLOL 50 MG PO TABS: 50.0000 mg | ORAL_TABLET | Freq: Every day | ORAL | 1 refills | Status: DC

## 2021-12-25 MED ORDER — BACLOFEN 10 MG PO TABS: 10.0000 mg | ORAL_TABLET | Freq: Every day | ORAL | 1 refills | Status: DC

## 2021-12-25 MED ORDER — FENOFIBRATE 160 MG PO TABS: 160.0000 mg | ORAL_TABLET | Freq: Every day | ORAL | 1 refills | Status: DC

## 2021-12-25 MED ORDER — ONDANSETRON HCL 8 MG PO TABS: 8.0000 mg | ORAL_TABLET | Freq: Two times a day (BID) | ORAL | 1 refills | Status: DC

## 2021-12-25 MED ORDER — SUCRALFATE 1 G PO TABS: 1.0000 g | ORAL_TABLET | Freq: Two times a day (BID) | ORAL | 1 refills | Status: DC

## 2021-12-25 MED ORDER — ATORVASTATIN CALCIUM 40 MG PO TABS: 40.0000 mg | ORAL_TABLET | Freq: Every day | ORAL | 1 refills | Status: DC

## 2021-12-25 NOTE — Assessment & Plan Note (Signed)
Chronic. Well controlled on Duloxetine 60mg daily.  Refill sent today.  Continue with current medication regimen.  Follow up in 6 months.  Call sooner if concerns arise.  

## 2021-12-25 NOTE — Assessment & Plan Note (Signed)
Chronic, ongoing.  Continue current medication regimen of Atorvastatin daily and adjust as needed.  Lipid panel today.  Refills sent today.

## 2021-12-25 NOTE — Assessment & Plan Note (Signed)
Chronic.  Controlled.  Noted on imaging in 08/14/2019.  Continue with current medication regimen of ASA and atorvastatin.  Labs ordered today.  Return to clinic in 6 months for reevaluation.  Call sooner if concerns arise.   

## 2021-12-25 NOTE — Progress Notes (Signed)
BP 112/72    Pulse (!) 57    Temp 98.8 F (37.1 C) (Oral)    Ht 5' 6.5" (1.689 m)    Wt 179 lb 12.8 oz (81.6 kg)    SpO2 98%    BMI 28.59 kg/m    Subjective:    Patient ID: Jose Washington, male    DOB: [birth year], 56 y.o.   MRN: [id number]  HPI: Jose Washington is a 56 y.o. male presenting on 12/25/2021 for comprehensive medical examination. Current medical complaints include: prescription refills  He currently lives with: Interim Problems from his last visit: no  HYPERTENSION / HYPERLIPIDEMIA Satisfied with current treatment? yes Duration of hypertension: years BP monitoring frequency: daily BP range: 125/85 BP medication side effects: no Past BP meds: atenolol Duration of hyperlipidemia: years Cholesterol medication side effects: no Cholesterol supplements: none Past cholesterol medications: atorvastain (lipitor) Medication compliance: excellent compliance Aspirin: no Recent stressors: no Recurrent headaches: no Visual changes: no Palpitations: no Dyspnea: no Chest pain: no Lower extremity edema: no Dizzy/lightheaded: no  DEPRESSION/ANXIETY Patient feels like the Duloxetine is still working well for his mood.  Denies concerns at visit today.   Depression Screen done today and results listed below:  Depression screen [facility] 2/9 12/25/2021 10/02/2021 06/23/2021 04/29/2021 12/30/2020  Decreased Interest 0 1 0 0 0  Down, Depressed, Hopeless 0 1 0 0 0  PHQ - 2 Score 0 2 0 0 0  Altered sleeping 0 1 0 - -  Tired, decreased energy 0 2 0 - -  Change in appetite 0 0 0 - -  Feeling bad or failure about yourself  0 0 0 - -  Trouble concentrating 0 1 0 - -  Moving slowly or fidgety/restless 0 1 0 - -  Suicidal thoughts 0 0 0 - -  PHQ-9 Score 0 7 0 - -  Difficult doing work/chores Not difficult at all - Not difficult at all - -  Some recent data might be hidden    The patient does not have a history of falls. I did complete a risk assessment for falls. A plan of care for  falls was documented.   Past Medical History:  Past Medical History:  Diagnosis Date   Allergy    Anxiety    Chronic duodenal ulcer with hemorrhage 2012   Chronic neck pain    Depression    GERD (gastroesophageal reflux disease)    Hyperlipidemia    Hypertension    Microscopic hematuria     Surgical History:  Past Surgical History:  Procedure Laterality Date   bLEEDING ULCER  2012   COLONOSCOPY WITH PROPOFOL N/A 03/24/2021   Procedure: COLONOSCOPY WITH PROPOFOL;  Surgeon: Lucilla Lame, MD;  Location: [facility] ENDOSCOPY;  Service: Endoscopy;  Laterality: N/A;   ESOPHAGOGASTRODUODENOSCOPY (EGD) WITH PROPOFOL N/A 03/24/2021   Procedure: ESOPHAGOGASTRODUODENOSCOPY (EGD) WITH PROPOFOL;  Surgeon: Lucilla Lame, MD;  Location: [facility] ENDOSCOPY;  Service: Endoscopy;  Laterality: N/A;   eye muscle repair  1972 and 1975   FLEXIBLE SIGMOIDOSCOPY      Medications:  Current Outpatient Medications on File Prior to Visit  Medication Sig   acetaminophen (TYLENOL) 500 MG tablet Take 1 tablet (500 mg total) by mouth every 6 (six) hours as needed.   Calcium Carbonate (CALCIUM 600 PO) Take 600 mg by mouth daily.   Cholecalciferol (VITAMIN D3) 1000 units CAPS Take 1 capsule by mouth daily.   desloratadine-pseudoephedrine (CLARINEX-D 12-HOUR) 2.5-120 MG 12 hr tablet Take 1 tablet by mouth  as needed.   DiphenhydrAMINE HCl (DIPHEDRYL ALLERGY PO) Take 25 mg by mouth daily.   docusate sodium (COLACE) 100 MG capsule Take 100 mg by mouth daily.   ELDERBERRY PO Take 1,000 mg by mouth 3 (three) times a week.   fluticasone (FLONASE) 50 MCG/ACT nasal spray Place 2 sprays into both nostrils 2 (two) times daily.   Glucosamine-Chondroit-Vit C-Mn (GLUCOSAMINE 1500 COMPLEX PO) Take by mouth 2 (two) times daily.   HYDROcodone-acetaminophen (NORCO/VICODIN) 5-325 MG tablet Take 1 tablet by mouth daily as needed for severe pain. Must last 30 days.   [START ON 12/31/2021] HYDROcodone-acetaminophen (NORCO/VICODIN) 5-325 MG  tablet Take 1 tablet by mouth daily as needed for severe pain. Must last 30 days.   ibuprofen (ADVIL,MOTRIN) 200 MG tablet Take 200 mg by mouth as needed (take 5 times a week).    levalbuterol (XOPENEX HFA) 45 MCG/ACT inhaler Inhale 1 puff into the lungs every 6 (six) hours as needed for wheezing.   magnesium oxide (MAG-OX) 400 MG tablet Take 400 mg by mouth daily.   Melatonin 5 MG TABS Take by mouth at bedtime.    Methylsulfonylmethane (MSM PO) Take by mouth daily.    Multiple Vitamin (MULTIVITAMIN) tablet Take 1 tablet by mouth daily.   omeprazole (PRILOSEC) 40 MG capsule Take 1 capsule (40 mg total) by mouth daily before breakfast.   Potassium 99 MG TABS Take 1 tablet by mouth.   simethicone (MYLICON) 80 MG chewable tablet Chew 80 mg by mouth every 6 (six) hours as needed for flatulence.   vitamin B-12 (CYANOCOBALAMIN) 1000 MCG tablet Take 1,000 mcg by mouth daily.   vitamin E 180 MG (400 UNITS) capsule Take 400 Units by mouth daily.   HYDROcodone-acetaminophen (NORCO/VICODIN) 5-325 MG tablet Take 1 tablet by mouth daily as needed for severe pain. Must last 30 days.   No current facility-administered medications on file prior to visit.    Allergies:  No Known Allergies  Social History:  Social History   Socioeconomic History   Marital status: Single    Spouse name: Not on file   Number of children: Not on file   Years of education: Not on file   Highest education level: Not on file  Occupational History   Not on file  Tobacco Use   Smoking status: Never   Smokeless tobacco: Never  Vaping Use   Vaping Use: Never used  Substance and Sexual Activity   Alcohol use: No    Alcohol/week: 0.0 standard drinks   Drug use: No   Sexual activity: Never    Partners: Female  Other Topics Concern   Not on file  Social History Narrative   Not on file   Social Determinants of Health   Financial Resource Strain: Not on file  Food Insecurity: Not on file  Transportation Needs: Not  on file  Physical Activity: Not on file  Stress: Not on file  Social Connections: Not on file  Intimate Partner Violence: Not on file   Social History   Tobacco Use  Smoking Status Never  Smokeless Tobacco Never   Social History   Substance and Sexual Activity  Alcohol Use No   Alcohol/week: 0.0 standard drinks    Family History:  Family History  Problem Relation Age of Onset   Diabetes Mother    Cancer Mother        breast- in remission   Mental illness Father    Depression Father    Dementia Father  VASCULAR   Allergies Brother    Cancer Maternal Grandfather        colon   Stroke Paternal Grandfather    Dementia Paternal Grandfather        vascular    Past medical history, surgical history, medications, allergies, family history and social history reviewed with patient today and changes made to appropriate areas of the chart.   Review of Systems  Eyes:  Negative for blurred vision and double vision.  Respiratory:  Negative for shortness of breath.   Cardiovascular:  Negative for chest pain, palpitations and leg swelling.  Gastrointestinal:  Positive for abdominal pain.  Musculoskeletal:  Positive for myalgias.  Neurological:  Negative for dizziness and headaches.  Psychiatric/Behavioral:  Negative for depression. The patient is not nervous/anxious.   All other ROS negative except what is listed above and in the HPI.      Objective:    BP 112/72    Pulse (!) 57    Temp 98.8 F (37.1 C) (Oral)    Ht 5' 6.5" (1.689 m)    Wt 179 lb 12.8 oz (81.6 kg)    SpO2 98%    BMI 28.59 kg/m   Wt Readings from Last 3 Encounters:  12/25/21 179 lb 12.8 oz (81.6 kg)  11/26/21 181 lb (82.1 kg)  10/28/21 181 lb (82.1 kg)    Physical Exam Vitals and nursing note reviewed.  Constitutional:      General: He is not in acute distress.    Appearance: Normal appearance. He is normal weight. He is not ill-appearing, toxic-appearing or diaphoretic.  HENT:     Head:  Normocephalic.     Right Ear: Tympanic membrane, ear canal and external ear normal.     Left Ear: Tympanic membrane, ear canal and external ear normal.     Nose: Nose normal. No congestion or rhinorrhea.     Mouth/Throat:     Mouth: Mucous membranes are moist.  Eyes:     General:        Right eye: No discharge.        Left eye: No discharge.     Extraocular Movements: Extraocular movements intact.     Conjunctiva/sclera: Conjunctivae normal.     Pupils: Pupils are equal, round, and reactive to light.  Cardiovascular:     Rate and Rhythm: Normal rate and regular rhythm.     Heart sounds: No murmur heard. Pulmonary:     Effort: Pulmonary effort is normal. No respiratory distress.     Breath sounds: Normal breath sounds. No wheezing, rhonchi or rales.  Abdominal:     General: Abdomen is flat. Bowel sounds are normal. There is no distension.     Palpations: Abdomen is soft.     Tenderness: There is no abdominal tenderness. There is no guarding.  Musculoskeletal:     Cervical back: Normal range of motion and neck supple.  Skin:    General: Skin is warm and dry.     Capillary Refill: Capillary refill takes less than 2 seconds.  Neurological:     General: No focal deficit present.     Mental Status: He is alert and oriented to person, place, and time.     Cranial Nerves: No cranial nerve deficit.     Motor: No weakness.     Deep Tendon Reflexes: Reflexes normal.  Psychiatric:        Mood and Affect: Mood normal.        Behavior: Behavior normal.  Thought Content: Thought content normal.        Judgment: Judgment normal.    Results for orders placed or performed in visit on 10/02/21  Novel Coronavirus, NAA ([facility])   Specimen: Nasopharyngeal(NP) swabs in vial transport medium  Result Value Ref Range   SARS-CoV-2, NAA Not Detected Not Detected  Rapid Strep Screen ([facility] ONLY)   Specimen: Other   Other  Result Value Ref Range   Strep Gp A Ag, IA W/Reflex  Negative Negative  Culture, Group A Strep   Other  Result Value Ref Range   Strep A Culture Negative   Veritor Flu A/B Waived  Result Value Ref Range   Influenza A Negative Negative   Influenza B Negative Negative      Assessment & Plan:   Problem List Items Addressed This Visit       Cardiovascular and Mediastinum   Benign essential hypertension    Chronic.  Controlled.  Continue with current medication regimen of Atenolol 50mg  daily.  Labs ordered today.  Continue to check blood pressures at home.  Follow up if blood pressures are greater than 140/90s.  Return to clinic in 6 months for reevaluation.  Call sooner if concerns arise.        Relevant Medications   atenolol (TENORMIN) 50 MG tablet   atorvastatin (LIPITOR) 40 MG tablet   fenofibrate 160 MG tablet   Aortic atherosclerosis (HCC)    Chronic.  Controlled.  Noted on imaging in 08/14/2019.  Continue with current medication regimen of ASA and atorvastatin.  Labs ordered today.  Return to clinic in 6 months for reevaluation.  Call sooner if concerns arise.        Relevant Medications   atenolol (TENORMIN) 50 MG tablet   atorvastatin (LIPITOR) 40 MG tablet   fenofibrate 160 MG tablet   Other Relevant Orders   Lipid panel     Other   Muscle spasticity (Chronic)    Ongoing. Followed by pain management.  He receives baclofen from our office. Refills sent today.       Relevant Medications   baclofen (LIORESAL) 10 MG tablet   Chronic musculoskeletal pain (Chronic)    Continue collaboration with chronic pain management. Refills sent of Baclofen at visit today. Follow up in 6 months.  Call sooner if concerns arise.      Relevant Medications   baclofen (LIORESAL) 10 MG tablet   DULoxetine (CYMBALTA) 30 MG capsule   Hyperlipidemia    Chronic, ongoing.  Continue current medication regimen of Atorvastatin daily and adjust as needed.  Lipid panel today.  Refills sent today.        Relevant Medications   atenolol  (TENORMIN) 50 MG tablet   atorvastatin (LIPITOR) 40 MG tablet   fenofibrate 160 MG tablet   Other Relevant Orders   Lipid panel   Vitamin D insufficiency    Labs ordered today. Will make recommendations based on lab results.       Relevant Orders   Vitamin D (25 hydroxy)   Major depression in remission (HCC)    Chronic. Well controlled on Duloxetine 60mg  daily.  Refill sent today.  Continue with current medication regimen.  Follow up in 6 months.  Call sooner if concerns arise.       Relevant Medications   busPIRone (BUSPAR) 30 MG tablet   DULoxetine (CYMBALTA) 30 MG capsule   Anxiety    Chronic. Well controlled on Duloxetine 60mg  daily.  Refill sent today.  Continue  with current medication regimen.  Follow up in 6 months.  Call sooner if concerns arise.       Relevant Medications   busPIRone (BUSPAR) 30 MG tablet   DULoxetine (CYMBALTA) 30 MG capsule   Other Visit Diagnoses     Annual physical exam    -  Primary   Health maintenance reviewed during visit today. Labs ordered. Up to date on vaccines.  Up to date on colonoscopy.   Relevant Orders   Vitamin D (25 hydroxy)   TSH   PSA   Lipid panel   CBC with Differential/Platelet   Comprehensive metabolic panel   Urinalysis, Routine w reflex microscopic        Discussed aspirin prophylaxis for myocardial infarction prevention and decision was it was not indicated  LABORATORY TESTING:  Health maintenance labs ordered today as discussed above.   The natural history of prostate cancer and ongoing controversy regarding screening and potential treatment outcomes of prostate cancer has been discussed with the patient. The meaning of a false positive PSA and a false negative PSA has been discussed. He indicates understanding of the limitations of this screening test and wishes to proceed with screening PSA testing.   IMMUNIZATIONS:   - Tdap: Tetanus vaccination status reviewed: last tetanus booster within 10 years. -  Influenza: Up to date - Pneumovax: Not applicable - Prevnar: Not applicable - COVID: Up to date - HPV: Not applicable - Shingrix vaccine: Up to date  SCREENING: - Colonoscopy: Up to date  Discussed with patient purpose of the colonoscopy is to detect colon cancer at curable precancerous or early stages   - AAA Screening: Not applicable  -Hearing Test: Not applicable  -Spirometry: Not applicable   PATIENT COUNSELING:    Sexuality: Discussed sexually transmitted diseases, partner selection, use of condoms, avoidance of unintended pregnancy  and contraceptive alternatives.   Advised to avoid cigarette smoking.  I discussed with the patient that most people either abstain from alcohol or drink within safe limits (<=14/week and <=4 drinks/occasion for males, <=7/weeks and <= 3 drinks/occasion for females) and that the risk for alcohol disorders and other health effects rises proportionally with the number of drinks per week and how often a drinker exceeds daily limits.  Discussed cessation/primary prevention of drug use and availability of treatment for abuse.   Diet: Encouraged to adjust caloric intake to maintain  or achieve ideal body weight, to reduce intake of dietary saturated fat and total fat, to limit sodium intake by avoiding high sodium foods and not adding table salt, and to maintain adequate dietary potassium and calcium preferably from fresh fruits, vegetables, and low-fat dairy products.    stressed the importance of regular exercise  Injury prevention: Discussed safety belts, safety helmets, smoke detector, smoking near bedding or upholstery.   Dental health: Discussed importance of regular tooth brushing, flossing, and dental visits.   Follow up plan: NEXT PREVENTATIVE PHYSICAL DUE IN 1 YEAR. Return in about 6 months (around 06/24/2022) for HTN, HLD, DM2 FU.

## 2021-12-25 NOTE — Assessment & Plan Note (Signed)
Continue collaboration with chronic pain management. Refills sent of Baclofen at visit today. Follow up in 6 months.  Call sooner if concerns arise.

## 2021-12-25 NOTE — Assessment & Plan Note (Signed)
Labs ordered today.  Will make recommendations based on lab results. ?

## 2021-12-25 NOTE — Assessment & Plan Note (Signed)
Ongoing. Followed by pain management.  He receives baclofen from our office. Refills sent today.

## 2021-12-25 NOTE — Telephone Encounter (Signed)
Requested medication (s) are due for refill today: Yes  Requested medication (s) are on the active medication list: Yes  Last refill:  12/24/20  Future visit scheduled: Yes  Notes to clinic:  Prescription expired.    Requested Prescriptions  Pending Prescriptions Disp Refills   hydrOXYzine (ATARAX) 25 MG tablet [Pharmacy Med Name: HYDROXYZINE HCL 25 MG TABLET] 270 tablet 2    Sig: TAKE 1 TABLET BY MOUTH THREE TIMES A DAY AS NEEDED     Ear, Nose, and Throat:  Antihistamines 2 Passed - 12/25/2021  1:46 AM      Passed - Cr in normal range and within 360 days    Creatinine, Ser  Date Value Ref Range Status  06/23/2021 1.21 0.76 - 1.27 mg/dL Final          Passed - Valid encounter within last 12 months    Recent Outpatient Visits           2 months ago Upper respiratory tract infection, unspecified type   [facility] Mc[place], Lauren A, NP   6 months ago Aortic atherosclerosis ([place])   [facility] Jon Billings, NP   1 year ago Benign essential hypertension   [place], [place] T, NP   1 year ago Benign essential hypertension   [facility] [place] American, [place]   2 years ago Essential hypertension, benign   [place], [name], [place]       Future Appointments             Today Jon Billings, NP [facility] [place]

## 2021-12-25 NOTE — Assessment & Plan Note (Signed)
Chronic.  Controlled.  Continue with current medication regimen of Atenolol 50mg daily.  Labs ordered today.  Continue to check blood pressures at home.  Follow up if blood pressures are greater than 140/90s.  Return to clinic in 6 months for reevaluation.  Call sooner if concerns arise.   

## 2021-12-26 LAB — CBC WITH DIFFERENTIAL/PLATELET
Basophils Absolute: 0.1 10*3/uL (ref 0.0–0.2)
Basos: 1 %
EOS (ABSOLUTE): 0.2 10*3/uL (ref 0.0–0.4)
Eos: 3 %
Hematocrit: 43.4 % (ref 37.5–51.0)
Hemoglobin: 14.9 g/dL (ref 13.0–17.7)
Immature Grans (Abs): 0 10*3/uL (ref 0.0–0.1)
Immature Granulocytes: 0 %
Lymphocytes Absolute: 2 10*3/uL (ref 0.7–3.1)
Lymphs: 34 %
MCH: 30.8 pg (ref 26.6–33.0)
MCHC: 34.3 g/dL (ref 31.5–35.7)
MCV: 90 fL (ref 79–97)
Monocytes Absolute: 0.4 10*3/uL (ref 0.1–0.9)
Monocytes: 7 %
Neutrophils Absolute: 3.2 10*3/uL (ref 1.4–7.0)
Neutrophils: 55 %
Platelets: 336 10*3/uL (ref 150–450)
RBC: 4.84 x10E6/uL (ref 4.14–5.80)
RDW: 13.3 % (ref 11.6–15.4)
WBC: 6 10*3/uL (ref 3.4–10.8)

## 2021-12-26 LAB — COMPREHENSIVE METABOLIC PANEL
ALT: 35 IU/L (ref 0–44)
AST: 24 IU/L (ref 0–40)
Albumin/Globulin Ratio: 1.7 (ref 1.2–2.2)
Albumin: 4.2 g/dL (ref 3.8–4.9)
Alkaline Phosphatase: 75 IU/L (ref 44–121)
BUN/Creatinine Ratio: 11 (ref 9–20)
BUN: 10 mg/dL (ref 6–24)
Bilirubin Total: 0.3 mg/dL (ref 0.0–1.2)
CO2: 21 mmol/L (ref 20–29)
Calcium: 9.4 mg/dL (ref 8.7–10.2)
Chloride: 104 mmol/L (ref 96–106)
Creatinine, Ser: 0.93 mg/dL (ref 0.76–1.27)
Globulin, Total: 2.5 g/dL (ref 1.5–4.5)
Glucose: 94 mg/dL (ref 70–99)
Potassium: 4.1 mmol/L (ref 3.5–5.2)
Sodium: 139 mmol/L (ref 134–144)
Total Protein: 6.7 g/dL (ref 6.0–8.5)
eGFR: 97 mL/min/{1.73_m2} (ref >59)

## 2021-12-26 LAB — LIPID PANEL
Chol/HDL Ratio: 4.1 ratio (ref 0.0–5.0)
Cholesterol, Total: 147 mg/dL (ref 100–199)
HDL: 36 mg/dL — ABNORMAL LOW (ref >39)
LDL Chol Calc (NIH): 76 mg/dL (ref 0–99)
Triglycerides: 210 mg/dL — ABNORMAL HIGH (ref 0–149)
VLDL Cholesterol Cal: 35 mg/dL (ref 5–40)

## 2021-12-26 LAB — TSH: TSH: 1.88 u[IU]/mL (ref 0.450–4.500)

## 2021-12-26 LAB — PSA: Prostate Specific Ag, Serum: 0.8 ng/mL (ref 0.0–4.0)

## 2021-12-26 LAB — VITAMIN D 25 HYDROXY (VIT D DEFICIENCY, FRACTURES): Vit D, 25-Hydroxy: 35.3 ng/mL (ref 30.0–100.0)

## 2021-12-28 NOTE — Progress Notes (Signed)
HI Jose Washington. It was good to see you last week.  Overall your lab work looks good.  Your Triglycerides did go up some.  I recommend decreasing processed food and refined sugar intake.  No other concerns at this time. Continue with medication regimen. Follow up as discussed.

## 2022-01-12 ENCOUNTER — Telehealth: Payer: No Typology Code available for payment source | Admitting: Pain Medicine

## 2022-01-24 NOTE — Progress Notes (Signed)
PROVIDER NOTE: Information contained herein reflects review and annotations entered in association with encounter. Interpretation of such information and data should be left to medically-trained personnel. Information provided to patient can be located elsewhere in the medical record under "Patient Instructions". Document created using STT-dictation technology, any transcriptional errors that may result from process are unintentional.  ?  ?Patient: Jose Washington  Service Category: E/M  Provider: Gaspar Cola, MD  ?DOB: 1966/01/06  DOS: 01/25/2022  Specialty: Interventional Pain Management  ?MRN: [id number]  Setting: Ambulatory outpatient  PCP: Jon Billings, NP  ?Type: Established Patient    Referring Provider: Jon Billings, NP  ?Location: Office  Delivery: Face-to-face    ? ?HPI  ?[name], a 56 y.o. year old male, is here today because of his Chronic pain syndrome [G89.4]. [name] primary complain today is Back Pain (low) and Neck Pain (right) ?Last encounter: My last encounter with him was on 11/26/2021. ?Pertinent problems: [name] has Chronic neck pain (3ry area of Pain) (Bilateral) (L>R); Chronic pain syndrome; Chronic low back pain (1ry area of Pain) (Bilateral) (L>R) w/o sciatica ; Chronic upper back pain (4th area of Pain) (Bilateral) (L>R); Chronic shoulder pain (Bilateral) (L>R); Osteoarthritis of AC (acromioclavicular) joint (Right); Chronic sacroiliac joint pain (Bilateral) (L>R); Muscle spasticity; Cervical DDD (C4-5, C5-6, C6-7 and C7-T1); Cervical foraminal stenosis (Bilateral: C5-6 & C6-7, Left: C4-5 & C7-T1); Cervical radiculitis (Bilateral) (L>R); Cervical facet syndrome (Bilateral) (L>R); Cervical spondylosis; Musculoskeletal neck pain (trapezius) (Left); Chronic knee pain (Left); Chronic lower extremity pain (2ry area of Pain) (Left); Grade 1 Retrolisthesis of L3 over L4; Lumbar facet arthropathy (Bilateral); Lumbar facet osteoarthritis; Lumbar facet  syndrome (Bilateral) (R>L); Lumbar spondylosis; Chronic right shoulder pain; Suprascapular neuropathy, right; DDD (degenerative disc disease), lumbosacral; Lumbar facet hypertrophy (Multilevel) (Bilateral); Spondylosis without myelopathy or radiculopathy, lumbosacral region; Inflammatory spondylopathy of lumbosacral region [facility]); Chronic lower extremity pain (Right); Chronic musculoskeletal pain; Abnormal MRI, lumbar spine (09/07/2017); Cervicalgia (Bilateral) (L>R); Spondylosis without myelopathy or radiculopathy, cervical region; Myofascial pain syndrome of thoracic spine (serratus muscle) (Right); Chronic flank pain (Right); Abnormal result on screening urine test (03/27/20 & 08/20/20); Chronic hip pain (Bilateral); Chronic hip pain (Right); Enthesopathy of hip region (Right); and Osteoarthritis of hip (Right) on their pertinent problem list. ?Pain Assessment: Severity of Chronic pain is reported as a 1 /10. Location: Back Lower/denies. Onset: More than a month ago. Quality: Dull, Aching, Sharp. Timing: Intermittent. Modifying factor(s): procedure, rest. meds. ?Vitals:  height is '5\' 7"'$  (1.702 m) and weight is 182 lb (82.6 kg). His temperature is 96.8 ?F (36 ?C) (abnormal). His blood pressure is 120/86 and his pulse is 74. His respiration is 18 and oxygen saturation is 99%.  ? ?Reason for encounter: both, medication management and post-procedure evaluation and assessment.   The patient indicates doing well with the current medication regimen. No adverse reactions or side effects reported to the medications.  The patient refers having attained an ongoing 75% relief of his right-sided low back pain with his second radiofrequency ablation. ? ?RTCB: 04/25/2022 ?Nonopioids transferred 08/20/2020: Baclofen ? ?Post-procedure evaluation  ?  Type: Lumbar Facet, Medial Branch Radiofrequency Ablation (RFA) #2  ?Laterality: Right  ?Level: L2, L3, L4, L5, & S1 Medial Branch Level(s). These levels will denervate the L3-4, L4-5  and L5-S1 lumbar facet joints.  ?Imaging: Fluoroscopic guidance ?Anesthesia: Local anesthesia (1-2% Lidocaine) ?Anxiolysis: IV Versed         ?Sedation: Moderate conscious sedation. ?DOS: 11/26/2021  ?Performed by: Gaspar Cola,  MD ? ?Purpose: Therapeutic/Palliative ?Indications: Low back pain severe enough to impact quality of life or function. ? ?Pain Score: ?Pre-procedure: 2 /10 ?Post-procedure: 0-No pain/10 ? ?   ?Effectiveness:  ?Initial hour after procedure: 100 %. ?Subsequent 4-6 hours post-procedure: 100 %. ?Analgesia past initial 6 hours: 75 % (ongoing). ?Ongoing improvement:  ?Analgesic: The patient refers having an ongoing 75% relief of his pain. ?Function: [name] reports improvement in function ?ROM: [name] reports improvement in ROM ? ?Pharmacotherapy Assessment  ?Analgesic: Hydrocodone/APAP 5/325, 1 tab PO QD (5 mg/day of hydrocodone) ?MME/day: 5 mg/day.  ? ?Monitoring: ?[name]: PDMP reviewed during this encounter.       ?Pharmacotherapy: No side-effects or adverse reactions reported. ?Compliance: No problems identified. ?Effectiveness: Clinically acceptable. ? ?Dewayne Shorter, RN  01/25/2022 11:09 AM  Sign when Signing Visit ?Nursing Pain Medication Assessment:  ?Safety precautions to be maintained throughout the outpatient stay will include: orient to surroundings, keep bed in low position, maintain call bell within reach at all times, provide assistance with transfer out of bed and ambulation.  ?Medication Inspection Compliance: Pill count conducted under aseptic conditions, in front of the patient. Neither the pills nor the bottle was removed from the patient's sight at any time. Once count was completed pills were immediately returned to the patient in their original bottle. ? ?Medication: Hydrocodone/APAP ?Pill/Patch Count:  0 of 30 pills remain ?Pill/Patch Appearance: Markings consistent with prescribed medication ?Bottle Appearance: Standard pharmacy container. Clearly  labeled. ?Filled Date: 01 / 12 / 2023 ?Last Medication intake:  12-30-2021 ?   UDS:  ?Summary  ?Date Value Ref Range Status  ?04/29/2021 Note  Final  ?  Comment:  ?  ==================================================================== ?[employer] Select 13 (MW) ?==================================================================== ?Test                             Result       Flag       Units ? ?Drug Present and Declared for Prescription Verification ?  Hydrocodone                    499          EXPECTED   ng/mg creat ?  Dihydrocodeine                 152          EXPECTED   ng/mg creat ?  Norhydrocodone                 433          EXPECTED   ng/mg creat ?   Sources of hydrocodone include scheduled prescription medications. ?   Dihydrocodeine and norhydrocodone are expected metabolites of ?   hydrocodone. Dihydrocodeine is also available as a scheduled ?   prescription medication. ? ?==================================================================== ?Test                      Result    Flag   Units      Ref Range ?  Creatinine              328              mg/dL      >=20 ?==================================================================== ?Declared Medications: ? The flagging and interpretation on this report are based on the ? following declared medications.  Unexpected results may arise from ? inaccuracies in the declared medications. ? ? **Note:  The testing scope of this panel includes these medications: ? ? Hydrocodone (Norco) ? ? **Note: The testing scope of this panel does not include the ? following reported medications: ? ? Acetaminophen (Tylenol) ? Acetaminophen (Norco) ? Aluminum hydroxide ? Atenolol ? Atorvastatin ? Baclofen ? Buspirone (Buspar) ? Cholecalciferol ? Chondroitin ? Cyanocobalamin ? Desloratadine ? Diphenhydramine ? Docusate ? Duloxetine (Cymbalta) ? Fenofibrate ? Fluticasone (Flonase) ? Glucosamine ? Hydroxyzine ? Ibuprofen ? Levalbuterol ? Lidocaine ? Magnesium ? Melatonin ?  Methylsulfonylmethane ? Multivitamin ? Omeprazole ? Ondansetron ? Potassium ? Pseudoephedrine ? Simethicone ? Sucralfate (Carafate) ? Supplement ? Vitamin C ? Vitamin E ?=============================================================

## 2022-01-25 ENCOUNTER — Telehealth: Payer: Self-pay

## 2022-01-25 ENCOUNTER — Encounter: Payer: Self-pay | Admitting: Pain Medicine

## 2022-01-25 ENCOUNTER — Ambulatory Visit: Payer: No Typology Code available for payment source | Attending: Pain Medicine | Admitting: Pain Medicine

## 2022-01-25 ENCOUNTER — Other Ambulatory Visit: Payer: Self-pay

## 2022-01-25 VITALS — BP 120/86 | HR 74 | Temp 96.8°F | Resp 18 | Ht 67.0 in | Wt 182.0 lb

## 2022-01-25 DIAGNOSIS — M25511 Pain in right shoulder: Secondary | ICD-10-CM | POA: Insufficient documentation

## 2022-01-25 DIAGNOSIS — M47816 Spondylosis without myelopathy or radiculopathy, lumbar region: Secondary | ICD-10-CM | POA: Diagnosis not present

## 2022-01-25 DIAGNOSIS — M542 Cervicalgia: Secondary | ICD-10-CM | POA: Diagnosis present

## 2022-01-25 DIAGNOSIS — M545 Low back pain, unspecified: Secondary | ICD-10-CM | POA: Diagnosis not present

## 2022-01-25 DIAGNOSIS — F1190 Opioid use, unspecified, uncomplicated: Secondary | ICD-10-CM

## 2022-01-25 DIAGNOSIS — M79605 Pain in left leg: Secondary | ICD-10-CM | POA: Diagnosis not present

## 2022-01-25 DIAGNOSIS — M549 Dorsalgia, unspecified: Secondary | ICD-10-CM | POA: Insufficient documentation

## 2022-01-25 DIAGNOSIS — M25512 Pain in left shoulder: Secondary | ICD-10-CM | POA: Diagnosis present

## 2022-01-25 DIAGNOSIS — G8929 Other chronic pain: Secondary | ICD-10-CM

## 2022-01-25 DIAGNOSIS — G894 Chronic pain syndrome: Secondary | ICD-10-CM

## 2022-01-25 DIAGNOSIS — Z79899 Other long term (current) drug therapy: Secondary | ICD-10-CM

## 2022-01-25 DIAGNOSIS — Z79891 Long term (current) use of opiate analgesic: Secondary | ICD-10-CM

## 2022-01-25 MED ORDER — HYDROCODONE-ACETAMINOPHEN 5-325 MG PO TABS: 1.0000 | ORAL_TABLET | Freq: Every day | ORAL | 0 refills | Status: DC | PRN

## 2022-01-25 NOTE — Progress Notes (Signed)
Nursing Pain Medication Assessment:  ?Safety precautions to be maintained throughout the outpatient stay will include: orient to surroundings, keep bed in low position, maintain call bell within reach at all times, provide assistance with transfer out of bed and ambulation.  ?Medication Inspection Compliance: Pill count conducted under aseptic conditions, in front of the patient. Neither the pills nor the bottle was removed from the patient's sight at any time. Once count was completed pills were immediately returned to the patient in their original bottle. ? ?Medication: Hydrocodone/APAP ?Pill/Patch Count:  0 of 30 pills remain ?Pill/Patch Appearance: Markings consistent with prescribed medication ?Bottle Appearance: Standard pharmacy container. Clearly labeled. ?Filled Date: 01 / 12 / 2023 ?Last Medication intake:  12-30-2021 ?

## 2022-01-25 NOTE — Patient Instructions (Signed)
____________________________________________________________________________________________ ? ?Medication Rules ? ?Purpose: To inform patients, and their family members, of our rules and regulations. ? ?Applies to: All patients receiving prescriptions (written or electronic). ? ?Pharmacy of record: Pharmacy where electronic prescriptions will be sent. If written prescriptions are taken to a different pharmacy, please inform the nursing staff. The pharmacy listed in the electronic medical record should be the one where you would like electronic prescriptions to be sent. ? ?Electronic prescriptions: In compliance with the  Strengthen Opioid Misuse Prevention (STOP) Act of 2017 (Session Law 2017-74/H243), effective November 01, 2018, all controlled substances must be electronically prescribed. Calling prescriptions to the pharmacy will cease to exist. ? ?Prescription refills: Only during scheduled appointments. Applies to all prescriptions. ? ?NOTE: The following applies primarily to controlled substances (Opioid* Pain Medications).  ? ?Type of encounter (visit): For patients receiving controlled substances, face-to-face visits are required. (Not an option or up to the patient.) ? ?Patient's responsibilities: ?Pain Pills: Bring all pain pills to every appointment (except for procedure appointments). ?Pill Bottles: Bring pills in original pharmacy bottle. Always bring the newest bottle. Bring bottle, even if empty. ?Medication refills: You are responsible for knowing and keeping track of what medications you take and those you need refilled. ?The day before your appointment: write a list of all prescriptions that need to be refilled. ?The day of the appointment: give the list to the admitting nurse. Prescriptions will be written only during appointments. No prescriptions will be written on procedure days. ?If you forget a medication: it will not be "Called in", "Faxed", or "electronically sent". You will  need to get another appointment to get these prescribed. ?No early refills. Do not call asking to have your prescription filled early. ?Prescription Accuracy: You are responsible for carefully inspecting your prescriptions before leaving our office. Have the discharge nurse carefully go over each prescription with you, before taking them home. Make sure that your name is accurately spelled, that your address is correct. Check the name and dose of your medication to make sure it is accurate. Check the number of pills, and the written instructions to make sure they are clear and accurate. Make sure that you are given enough medication to last until your next medication refill appointment. ?Taking Medication: Take medication as prescribed. When it comes to controlled substances, taking less pills or less frequently than prescribed is permitted and encouraged. ?Never take more pills than instructed. ?Never take medication more frequently than prescribed.  ?Inform other Doctors: Always inform, all of your healthcare providers, of all the medications you take. ?Pain Medication from other Providers: You are not allowed to accept any additional pain medication from any other Doctor or Healthcare provider. There are two exceptions to this rule. (see below) In the event that you require additional pain medication, you are responsible for notifying us, as stated below. ?Cough Medicine: Often these contain an opioid, such as codeine or hydrocodone. Never accept or take cough medicine containing these opioids if you are already taking an opioid* medication. The combination may cause respiratory failure and death. ?Medication Agreement: You are responsible for carefully reading and following our Medication Agreement. This must be signed before receiving any prescriptions from our practice. Safely store a copy of your signed Agreement. Violations to the Agreement will result in no further prescriptions. (Additional copies of our  Medication Agreement are available upon request.) ?Laws, Rules, & Regulations: All patients are expected to follow all Federal and State Laws, Statutes, Rules, & Regulations. Ignorance of   the Laws does not constitute a valid excuse.  ?Illegal drugs and Controlled Substances: The use of illegal substances (including, but not limited to marijuana and its derivatives) and/or the illegal use of any controlled substances is strictly prohibited. Violation of this rule may result in the immediate and permanent discontinuation of any and all prescriptions being written by our practice. The use of any illegal substances is prohibited. ?Adopted CDC guidelines & recommendations: Target dosing levels will be at or below 60 MME/day. Use of benzodiazepines** is not recommended. ? ?Exceptions: There are only two exceptions to the rule of not receiving pain medications from other Healthcare Providers. ?Exception #1 (Emergencies): In the event of an emergency (i.e.: accident requiring emergency care), you are allowed to receive additional pain medication. However, you are responsible for: As soon as you are able, call our office [phone], at any time of the day or night, and leave a message stating your name, the date and nature of the emergency, and the name and dose of the medication prescribed. In the event that your call is answered by a member of our staff, make sure to document and save the date, time, and the name of the person that took your information.  ?Exception #2 (Planned Surgery): In the event that you are scheduled by another doctor or dentist to have any type of surgery or procedure, you are allowed (for a period no longer than 30 days), to receive additional pain medication, for the acute post-op pain. However, in this case, you are responsible for picking up a copy of our "Post-op Pain Management for Surgeons" handout, and giving it to your surgeon or dentist. This document is available at our office, and  does not require an appointment to obtain it. Simply go to our office during business hours (Monday-Thursday from 8:00 AM to 4:00 PM) (Friday 8:00 AM to 12:00 Noon) or if you have a scheduled appointment with us, prior to your surgery, and ask for it by name. In addition, you are responsible for: calling our office [phone], at any time of the day or night, and leaving a message stating your name, name of your surgeon, type of surgery, and date of procedure or surgery. Failure to comply with your responsibilities may result in termination of therapy involving the controlled substances. ?Medication Agreement Violation. Following the above rules, including your responsibilities will help you in avoiding a Medication Agreement Violation (?Breaking your Pain Medication Contract?). ? ?*Opioid medications include: morphine, codeine, oxycodone, oxymorphone, hydrocodone, hydromorphone, meperidine, tramadol, tapentadol, buprenorphine, fentanyl, methadone. ?**Benzodiazepine medications include: diazepam (Valium), alprazolam (Xanax), clonazepam (Klonopine), lorazepam (Ativan), clorazepate (Tranxene), chlordiazepoxide (Librium), estazolam (Prosom), oxazepam (Serax), temazepam (Restoril), triazolam (Halcion) ?(Last updated: 07/29/2021) ?____________________________________________________________________________________________ ? ____________________________________________________________________________________________ ? ?Medication Recommendations and Reminders ? ?Applies to: All patients receiving prescriptions (written and/or electronic). ? ?Medication Rules & Regulations: These rules and regulations exist for your safety and that of others. They are not flexible and neither are we. Dismissing or ignoring them will be considered "non-compliance" with medication therapy, resulting in complete and irreversible termination of such therapy. (See document titled "Medication Rules" for more details.) In all conscience,  because of safety reasons, we cannot continue providing a therapy where the patient does not follow instructions. ? ?Pharmacy of record:  ?Definition: This is the pharmacy where your electronic prescriptions w

## 2022-01-25 NOTE — Telephone Encounter (Signed)
Spoke with pharmacy, [facility] and cancelled the script for Hydrocodone dated to be filled on 12-31-2021.  ?

## 2022-03-04 ENCOUNTER — Encounter: Payer: Self-pay | Admitting: Pain Medicine

## 2022-03-06 ENCOUNTER — Other Ambulatory Visit: Payer: Self-pay | Admitting: Nurse Practitioner

## 2022-03-07 NOTE — Progress Notes (Signed)
Patient: Jose Washington  Service Category: E/M  Provider: Gaspar Cola, MD  ?DOB: Feb 25, 1966  DOS: 03/08/2022  Location: Office  ?MRN: [id number]  Setting: Ambulatory outpatient  Referring Provider: Jon Billings, NP  ?Type: Established Patient  Specialty: Interventional Pain Management  PCP: Jose Billings, NP  ?Location: Remote location  Delivery: TeleHealth    ? ?Virtual Encounter - Pain Management ?PROVIDER NOTE: Information contained herein reflects review and annotations entered in association with encounter. Interpretation of such information and data should be left to medically-trained personnel. Information provided to patient can be located elsewhere in the medical record under "Patient Instructions". Document created using STT-dictation technology, any transcriptional errors that may result from process are unintentional.  ?  ?Contact & Pharmacy ?Preferred: [phone] ?Home: [phone] (home) ?Mobile: [phone] (mobile) ?E-mail: [email or web address]  ?[facility] [id number], [place] ?[place] ?[place] [address] ?Phone: [phone] Fax: [phone] ? ?[id number]?Phone: [phone]: [phone]?  ?Pre-screening  ?Jose Washington "in-person" vs "virtual" encounter. He indicated preferring virtual for this encounter.  ? ?Reason ?COVID-19*  Social distancing based on CDC and AMA recommendations.  ? ?I contacted Jose Roeson 03/08/2022 via telephone.      I clearly identified myself as FGaspar Cola MD. I verified that I was speaking with the correct person using two identifiers (Name: Jose Washington. ? ?Consent ?I sought verbal advanced consent from Jose Washington virtual visit interactions. I informed Jose Washington possible security and privacy concerns, risks, and limitations associated  with providing "not-in-person" medical evaluation and management services. I also informed Jose Washington the availability of "in-person" appointments. Finally, I informed him that there would be a charge for the virtual visit and that he could be  personally, fully or partially, financially responsible for it. Jose Washington understanding and agreed to proceed.  ? ?Historic Elements   ?Jose Washington a 56y.o. year old, male patient evaluated today after our last contact on 01/25/2022. Jose Washington Washington a past medical history of Allergy, Anxiety, Chronic duodenal ulcer with hemorrhage (2012), Chronic neck pain, Depression, GERD (gastroesophageal reflux disease), Hyperlipidemia, Hypertension, and Microscopic hematuria. He also  Washington a past surgical history that includes eye muscle repair (1972 and 1975); bLEEDING ULCER (2012); Flexible sigmoidoscopy; Colonoscopy with propofol (N/A, 03/24/2021); and Esophagogastroduodenoscopy (egd) with propofol (N/A, 03/24/2021). Jose Washington a current medication list which includes the following prescription(s): acetaminophen, atenolol, atorvastatin, baclofen, buspirone, calcium carbonate, vitamin d3, desloratadine-pseudoephedrine, diphenhydramine hcl, docusate sodium, duloxetine, elderberry, fenofibrate, fluticasone, glucosamine-chondroit-vit c-mn, hydrocodone-acetaminophen, [START ON 03/26/2022] hydrocodone-acetaminophen, hydroxyzine, ibuprofen, levalbuterol, magnesium oxide, melatonin, methylsulfonylmethane, multivitamin, omeprazole, ondansetron, potassium, simethicone, sucralfate, vitamin b-12, vitamin e, and hydrocodone-acetaminophen. He  reports that he Washington never smoked. He Washington never used smokeless tobacco. He reports that he does not drink alcohol and does not use drugs. Jose Washington.  ? ?HPI  ?Today, he is being contacted for new problems.  The patient indicates having some pain in the area of the right shoulder blade which he believes is  a trigger point.  He is interested in coming in for a trigger point injection of that area.  I have told him that I will do my best thing trying to bring him in this week, but if by any chance I  do not have any available appointments I have asked him if he would mind for Dr. Holley Raring to do a trigger point for him.  He indicated that he [place] no problems with that. ? ?RTCB: 04/25/2022 ?Nonopioids transferred 08/20/2020: Baclofen ? ?Pharmacotherapy Assessment  ? ?Opioid Analgesic: Hydrocodone/APAP 5/325, 1 tab PO QD (5 mg/day of hydrocodone) ?MME/day: 5 mg/day.  ? ?Monitoring: ?[place] PMP: PDMP reviewed during this encounter.       ?Pharmacotherapy: No side-effects or adverse reactions reported. ?Compliance: No problems identified. ?Effectiveness: Clinically acceptable. ?Plan: Refer to "POC". UDS:  ?Summary  ?Date Value Ref Range Status  ?04/29/2021 Note  Final  ?  Comment:  ?  ==================================================================== ?[employer] Select 13 (MW) ?==================================================================== ?Test                             Result       Flag       Units ? ?Drug Present and Declared for Prescription Verification ?  Hydrocodone                    499          EXPECTED   ng/mg creat ?  Dihydrocodeine                 152          EXPECTED   ng/mg creat ?  Norhydrocodone                 433          EXPECTED   ng/mg creat ?   Sources of hydrocodone include scheduled prescription medications. ?   Dihydrocodeine and norhydrocodone are expected metabolites of ?   hydrocodone. Dihydrocodeine is also available as a scheduled ?   prescription medication. ? ?==================================================================== ?Test                      Result    Flag   Units      Ref Range ?  Creatinine              328              mg/dL      >=20 ?==================================================================== ?Declared Medications: ? The flagging and interpretation on this report  are based on the ? following declared medications.  Unexpected results may arise from ? inaccuracies in the declared medications. ? ? **Note: The testing scope of this panel includes these medications: ? ? Hydrocodone (Norco) ? ? **Note: The testing scope of this panel does not include the ? following reported medications: ? ? Acetaminophen (Tylenol) ? Acetaminophen (Norco) ? Aluminum hydroxide ? Atenolol ? Atorvastatin ? Baclofen ? Buspirone (Buspar) ? Cholecalciferol ? Chondroitin ? Cyanocobalamin ? Desloratadine ? Diphenhydramine ? Docusate ? Duloxetine (Cymbalta) ? Fenofibrate ? Fluticasone (Flonase) ? Glucosamine ? Hydroxyzine ? Ibuprofen ? Levalbuterol ? Lidocaine ? Magnesium ? Melatonin ? Methylsulfonylmethane ? Multivitamin ? Omeprazole ? Ondansetron ? Potassium ? Pseudoephedrine ? Simethicone ? Sucralfate (Carafate) ? Supplement ? Vitamin C ? Vitamin E ?==================================================================== ?For clinical consultation, please call [phone]. ?==================================================================== ?  ?  ? ?Laboratory Chemistry Profile  ? ?Renal ?Lab Results  ?Component Value Date  ? BUN 10 12/25/2021  ? CREATININE 0.93 12/25/2021  ? BCR 11 12/25/2021  ? GFRAA 84 12/24/2020  ? GFRNONAA >60 02/08/2021  ?  Hepatic ?Lab Results  ?Component Value Date  ? AST 24 12/25/2021  ?  ALT 35 12/25/2021  ? ALBUMIN 4.2 12/25/2021  ? ALKPHOS 75 12/25/2021  ? LIPASE 28 02/08/2021  ?  ?Electrolytes ?Lab Results  ?Component Value Date  ? NA 139 12/25/2021  ? K 4.1 12/25/2021  ? CL 104 12/25/2021  ? CALCIUM 9.4 12/25/2021  ? MG 2.0 11/12/2016  ?  Bone ?Lab Results  ?Component Value Date  ? VD25OH 35.3 12/25/2021  ? 25OHVITD1 23 (L) 11/12/2016  ? 70HEKBTC4 <1.0 11/12/2016  ? 25OHVITD3 23 11/12/2016  ?  ?Inflammation (CRP: Acute Phase) (ESR: Chronic Phase) ?Lab Results  ?Component Value Date  ? CRP 1.4 (H) 11/12/2016  ? ESRSEDRATE 3 11/12/2016  ?    ?  ? ?Note: Above Lab results  reviewed. ? ?Imaging  ?[place] C-ARM 1-60 MIN NO REPORT ?Fluoro was used, but no Radiologist interpretation will be provided.  ?Please refer to "NOTES" tab for provider progress note. ? ?Assessment

## 2022-03-08 ENCOUNTER — Ambulatory Visit: Payer: No Typology Code available for payment source | Attending: Pain Medicine | Admitting: Pain Medicine

## 2022-03-08 ENCOUNTER — Other Ambulatory Visit: Payer: Self-pay | Admitting: Nurse Practitioner

## 2022-03-08 DIAGNOSIS — M7918 Myalgia, other site: Secondary | ICD-10-CM | POA: Diagnosis not present

## 2022-03-08 DIAGNOSIS — M25511 Pain in right shoulder: Secondary | ICD-10-CM | POA: Diagnosis not present

## 2022-03-08 DIAGNOSIS — G8929 Other chronic pain: Secondary | ICD-10-CM

## 2022-03-08 NOTE — Telephone Encounter (Signed)
Requested Prescriptions  ?Pending Prescriptions Disp Refills  ?? levalbuterol (XOPENEX HFA) 45 MCG/ACT inhaler [Pharmacy Med Name: LEVALBUTEROL TAR HFA 45MCG INH] 15 each 12  ?  Sig: INHALE 1 PUFF INTO THE LUNGS EVERY 6 HOURS AS NEEDED FOR WHEEZING.  ?  ? Pulmonology:  Beta Agonists 2 Passed - 03/06/2022 12:06 AM  ?  ?  Passed - Last BP in normal range  ?  BP Readings from Last 1 Encounters:  ?01/25/22 120/86  ?   ?  ?  Passed - Last Heart Rate in normal range  ?  Pulse Readings from Last 1 Encounters:  ?01/25/22 74  ?   ?  ?  Passed - Valid encounter within last 12 months  ?  Recent Outpatient Visits   ?      ? 2 months ago Annual physical exam  ? [place], NP  ? 5 months ago Upper respiratory tract infection, unspecified type  ? [place], Lauren A, NP  ? 8 months ago Aortic atherosclerosis ([place])  ? [place], NP  ? 1 year ago Benign essential hypertension  ? [place], Henrine Screws T, NP  ? 1 year ago Benign essential hypertension  ? [place], [place], [place]  ?  ?  ?Future Appointments   ?        ? In 3 months Jon Billings, NP [facility]  ?  ? ?  ?  ?  ? ? ?

## 2022-03-09 NOTE — Telephone Encounter (Signed)
Requested medication (s) are due for refill today: no ? ?Requested medication (s) are on the active medication list: yes ? ?Last refill:  03/08/22 ? ?Future visit scheduled: yes ? ?Notes to clinic:  Unable to refill per protocol, last refill by provider 03/08/22. Pharmacy request alternative Rx,PA is required. ? ? ?  ?Requested Prescriptions  ?Pending Prescriptions Disp Refills  ? levalbuterol (XOPENEX HFA) 45 MCG/ACT inhaler [Pharmacy Med Name: LEVALBUTEROL TAR HFA 45MCG INH] 15 each 12  ?  Sig: INHALE 1 PUFF INTO THE LUNGS EVERY 6 HOURS AS NEEDED FOR WHEEZING.  ?  ? Pulmonology:  Beta Agonists 2 Passed - 03/08/2022 10:12 AM  ?  ?  Passed - Last BP in normal range  ?  BP Readings from Last 1 Encounters:  ?01/25/22 120/86  ?  ?  ?  ?  Passed - Last Heart Rate in normal range  ?  Pulse Readings from Last 1 Encounters:  ?01/25/22 74  ?  ?  ?  ?  Passed - Valid encounter within last 12 months  ?  Recent Outpatient Visits   ? ?      ? 2 months ago Annual physical exam  ? [place], NP  ? 5 months ago Upper respiratory tract infection, unspecified type  ? [place], Lauren A, NP  ? 8 months ago Aortic atherosclerosis ([place])  ? [place], NP  ? 1 year ago Benign essential hypertension  ? [place], Henrine Screws T, NP  ? 1 year ago Benign essential hypertension  ? [place], [place], [place]  ? ?  ?  ?Future Appointments   ? ?        ? In 3 months Jon Billings, NP [facility]  ? ?  ? ? ?  ?  ?  ? ? ?

## 2022-03-11 ENCOUNTER — Ambulatory Visit: Payer: No Typology Code available for payment source | Attending: Pain Medicine | Admitting: Pain Medicine

## 2022-03-11 ENCOUNTER — Encounter: Payer: Self-pay | Admitting: Pain Medicine

## 2022-03-11 VITALS — BP 111/86 | HR 59 | Temp 97.2°F | Resp 16 | Ht 67.0 in | Wt 180.0 lb

## 2022-03-11 DIAGNOSIS — M7918 Myalgia, other site: Secondary | ICD-10-CM | POA: Diagnosis present

## 2022-03-11 DIAGNOSIS — M25511 Pain in right shoulder: Secondary | ICD-10-CM | POA: Insufficient documentation

## 2022-03-11 DIAGNOSIS — M542 Cervicalgia: Secondary | ICD-10-CM | POA: Insufficient documentation

## 2022-03-11 DIAGNOSIS — M549 Dorsalgia, unspecified: Secondary | ICD-10-CM | POA: Diagnosis present

## 2022-03-11 DIAGNOSIS — G8929 Other chronic pain: Secondary | ICD-10-CM | POA: Insufficient documentation

## 2022-03-11 MED ORDER — ROPIVACAINE HCL 2 MG/ML IJ SOLN
INTRAMUSCULAR | Status: AC
  Filled 2022-03-11: qty 20

## 2022-03-11 MED ORDER — TRIAMCINOLONE ACETONIDE 40 MG/ML IJ SUSP
40.0000 mg | Freq: Once | INTRAMUSCULAR | Status: AC
  Administered 2022-03-11: 40 mg

## 2022-03-11 MED ORDER — ROPIVACAINE HCL 2 MG/ML IJ SOLN
4.0000 mL | Freq: Once | INTRAMUSCULAR | Status: AC
  Administered 2022-03-11: 20 mL

## 2022-03-11 MED ORDER — PENTAFLUOROPROP-TETRAFLUOROETH EX AERO
INHALATION_SPRAY | Freq: Once | CUTANEOUS | Status: AC
  Administered 2022-03-11: 30 via TOPICAL

## 2022-03-11 MED ORDER — TRIAMCINOLONE ACETONIDE 40 MG/ML IJ SUSP
INTRAMUSCULAR | Status: AC
  Filled 2022-03-11: qty 1

## 2022-03-11 MED ORDER — LIDOCAINE HCL 2 % IJ SOLN
5.0000 mL | Freq: Once | INTRAMUSCULAR | Status: AC
  Administered 2022-03-11: 400 mg

## 2022-03-11 MED ORDER — LIDOCAINE HCL 2 % IJ SOLN
INTRAMUSCULAR | Status: AC
  Filled 2022-03-11: qty 20

## 2022-03-11 NOTE — Progress Notes (Signed)
PROVIDER NOTE: Interpretation of information contained herein should be left to medically-trained personnel. Specific patient instructions are provided elsewhere under "Patient Instructions" section of medical record. This document was created in part using STT-dictation technology, any transcriptional errors that may result from this process are unintentional.  ?Patient: Jose Washington ?Type: Established ?DOB: 1966/05/19 ?MRN: [id number] ?PCP: Jon Billings, NP  Service: Procedure ?DOS: 03/11/2022 ?Setting: Ambulatory ?Location: Ambulatory outpatient facility ?Delivery: Face-to-face Provider: Gaspar Cola, MD ?Specialty: Interventional Pain Management ?Specialty designation: 09 ?Location: Outpatient facility ?Ref. Prov.: Jon Billings, NP   ? ?Primary Reason for Visit: Interventional Pain Management Treatment. ?CC: Shoulder Pain (right) ? ?  ?Procedure:          Anesthesia, Analgesia, Anxiolysis:  ?Type: Right trapezius and erector [place] muscle Trigger Point Injection (1-2 muscle groups) #1  ?CPT: 20552 ?Primary Purpose: Therapeutic ?Region: Posterior Cervicothoracic ?Level: Cervico-thoracic ?Target Area: Lateral trapezius muscle Trigger Point ?Approach: Percutaneous, ipsilateral approach. ?Laterality: Right        Type: Local Anesthesia ?Local Anesthetic: Lidocaine 1-2% ?Sedation: None  ?Indication(s):  Analgesia ?Route: Infiltration ([place]/IM) ?IV Access: N/A ? ? ?Position: Sitting  ? ?1. Trigger point of shoulder region (Right)   ?2. Musculoskeletal neck pain (trapezius)   ?3. Chronic shoulder pain (Right)   ?4. Chronic upper back pain (4th area of Pain) (Bilateral)   ?5. Chronic musculoskeletal pain   ?6. Myofascial pain syndrome of thoracic spine (serratus muscle) (Right)   ? ?NAS-11 Pain score:  ? Pre-procedure: 4 /10  ? Post-procedure: 0-No pain/10  ? ?  ?Pre-op H&P Assessment:  ?Jose Washington is a 56 y.o. (year old), male patient, seen today for interventional treatment. He  has a past surgical  history that includes eye muscle repair (1972 and 1975); bLEEDING ULCER (2012); Flexible sigmoidoscopy; Colonoscopy with propofol (N/A, 03/24/2021); and Esophagogastroduodenoscopy (egd) with propofol (N/A, 03/24/2021). Jose Washington has a current medication list which includes the following prescription(s): acetaminophen, atenolol, atorvastatin, baclofen, buspirone, calcium carbonate, vitamin d3, desloratadine-pseudoephedrine, diphenhydramine hcl, docusate sodium, duloxetine, elderberry, fenofibrate, fluticasone, glucosamine-chondroit-vit c-mn, hydrocodone-acetaminophen, [START ON 03/26/2022] hydrocodone-acetaminophen, hydroxyzine, ibuprofen, levalbuterol, magnesium oxide, melatonin, methylsulfonylmethane, multivitamin, omeprazole, ondansetron, potassium, simethicone, sucralfate, vitamin b-12, vitamin e, and hydrocodone-acetaminophen. His primarily concern today is the Shoulder Pain (right) ? ?Initial Vital Signs:  ?Pulse/HCG Rate: (!) 59  ?Temp: (!) 97.2 ?F (36.2 ?C) ?Resp: 16 ?BP: 111/86 ?SpO2: 100 % ? ?BMI: Estimated body mass index is 28.19 kg/m? as calculated from the following: ?  Height as of this encounter: '5\' 7"'$  (1.702 m). ?  Weight as of this encounter: 180 lb (81.6 kg). ? ?Risk Assessment: ?Allergies: Reviewed. He has No Known Allergies.  ?Allergy Precautions: None required ?Coagulopathies: Reviewed. None identified.  ?Blood-thinner therapy: None at this time ?Active Infection(s): Reviewed. None identified. Jose Washington is afebrile ? ?Site Confirmation: Jose Washington was asked to confirm the procedure and laterality before marking the site ?Procedure checklist: Completed ?Consent: Before the procedure and under the influence of no sedative(s), amnesic(s), or anxiolytics, the patient was informed of the treatment options, risks and possible complications. To fulfill our ethical and legal obligations, as recommended by the American Medical Association's Code of Ethics, I have informed the patient of my clinical  impression; the nature and purpose of the treatment or procedure; the risks, benefits, and possible complications of the intervention; the alternatives, including doing nothing; the risk(s) and benefit(s) of the alternative treatment(s) or procedure(s); and the risk(s) and benefit(s) of doing nothing. ?The patient was provided information about the general  risks and possible complications associated with the procedure. These may include, but are not limited to: failure to achieve desired goals, infection, bleeding, organ or nerve damage, allergic reactions, paralysis, and death. ?In addition, the patient was informed of those risks and complications associated to the procedure, such as failure to decrease pain; infection; bleeding; organ or nerve damage with subsequent damage to sensory, motor, and/or autonomic systems, resulting in permanent pain, numbness, and/or weakness of one or several areas of the body; allergic reactions; (i.e.: anaphylactic reaction); and/or death. ?Furthermore, the patient was informed of those risks and complications associated with the medications. These include, but are not limited to: allergic reactions (i.e.: anaphylactic or anaphylactoid reaction(s)); adrenal axis suppression; blood sugar elevation that in diabetics may result in ketoacidosis or comma; water retention that in patients with history of congestive heart failure may result in shortness of breath, pulmonary edema, and decompensation with resultant heart failure; weight gain; swelling or edema; medication-induced neural toxicity; particulate matter embolism and blood vessel occlusion with resultant organ, and/or nervous system infarction; and/or aseptic necrosis of one or more joints. ?Finally, the patient was informed that Medicine is not an exact science; therefore, there is also the possibility of unforeseen or unpredictable risks and/or possible complications that may result in a catastrophic outcome. The patient  indicated having understood very clearly. We have given the patient no guarantees and we have made no promises. Enough time was given to the patient to ask questions, all of which were answered to the patient's satisfaction. Mr. Ihnen has indicated that he wanted to continue with the procedure. ?Attestation: I, the ordering provider, attest that I have discussed with the patient the benefits, risks, side-effects, alternatives, likelihood of achieving goals, and potential problems during recovery for the procedure that I have provided informed consent. ?Date  Time: 03/11/2022  8:55 AM ? ?Pre-Procedure Preparation:  ?Monitoring: As per clinic protocol. Respiration, ETCO2, SpO2, BP, heart rate and rhythm monitor placed and checked for adequate function ?Safety Precautions: Patient was assessed for positional comfort and pressure points before starting the procedure. ?Time-out: I initiated and conducted the "Time-out" before starting the procedure, as per protocol. The patient was asked to participate by confirming the accuracy of the "Time Out" information. Verification of the correct person, site, and procedure were performed and confirmed by me, the nursing staff, and the patient. "Time-out" conducted as per Joint Commission's Universal Protocol (UP.01.01.01). ?Time: 0912 ? ?Description of Procedure:          ?Area Prepped: Entire Posterior Cervicothoracic Region ?DuraPrep (Iodine Povacrylex [0.7% available iodine] and Isopropyl Alcohol, 74% w/w) ?Safety Precautions: Aspiration looking for blood return was conducted prior to all injections. At no point did we inject any substances, as a needle was being advanced. No attempts were made at seeking any paresthesias. Safe injection practices and needle disposal techniques used. Medications properly checked for expiration dates. SDV (single dose vial) medications used. ?Description of the Procedure: Protocol guidelines were followed. The patient was placed in position  over the fluoroscopy table. The target area was identified and the area prepped in the usual manner. Skin & deeper tissues infiltrated with local anesthetic. Appropriate amount of time allowed to pass for local

## 2022-03-11 NOTE — Patient Instructions (Signed)

## 2022-03-12 ENCOUNTER — Telehealth: Payer: Self-pay | Admitting: *Deleted

## 2022-03-12 NOTE — Telephone Encounter (Signed)
Post procedure call;  patient reports that he is doing well.  

## 2022-03-29 NOTE — Progress Notes (Unsigned)
Patient: Jose Washington  Service Category: E/M  Provider: Gaspar Cola, MD  DOB: 04/11/1966  DOS: 04/01/2022  Location: Office  MRN: [id number]  Setting: Ambulatory outpatient  Referring Provider: Jon Billings, NP  Type: Established Patient  Specialty: Interventional Pain Management  PCP: Jon Billings, NP  Location: Remote location  Delivery: TeleHealth     Virtual Encounter - Pain Management PROVIDER NOTE: Information contained herein reflects review and annotations entered in association with encounter. Interpretation of such information and data should be left to medically-trained personnel. Information provided to patient can be located elsewhere in the medical record under "Patient Instructions". Document created using STT-dictation technology, any transcriptional errors that may result from process are unintentional.    Contact & Pharmacy Preferred: [phone] Home: [phone] (home) Mobile: [phone] (mobile) E-mail: [email or web address] [id number], [place] [address] Phone: [phone] Fax: [phone]  [id number] [address] [place]: [phone]: [phone]  Pre-screening  Mr. SDinooffered "in-person" vs "virtual" encounter. He indicated preferring virtual for this encounter.   Reason COVID-19*  Social distancing based on CDC and AMA recommendations.   I contacted CLaural Roeson 04/01/2022 via telephone.      I clearly identified myself as FGaspar Cola MD. I verified that I was speaking with the correct person using two identifiers (Name: CBRON SNELLINGS and date of birth: [birth year].  Consent I sought verbal advanced consent from [name] virtual visit interactions. I informed Mr. SViveretteof possible security and privacy concerns, risks, and limitations associated  with providing "not-in-person" medical evaluation and management services. I also informed Mr. SHellenof the availability of "in-person" appointments. Finally, I informed him that there would be a charge for the virtual visit and that he could be  personally, fully or partially, financially responsible for it. Mr. SRubertexpressed understanding and agreed to proceed.   Historic Elements   Mr. CVALDIS BEVILLis a 56y.o. year old, male patient evaluated today after our last contact on 03/11/2022. Mr. SFonda has a past medical history of Allergy, Anxiety, Chronic duodenal ulcer with hemorrhage (2012), Chronic neck pain, Depression, GERD (gastroesophageal reflux disease), Hyperlipidemia, Hypertension, and Microscopic hematuria. He also  has a past surgical history that includes eye muscle repair (1972 and 1975); bLEEDING ULCER (2012); Flexible sigmoidoscopy; Colonoscopy with propofol (N/A, 03/24/2021); and Esophagogastroduodenoscopy (egd) with propofol (N/A, 03/24/2021). Mr. SHennonhas a current medication list which includes the following prescription(s): acetaminophen, atenolol, atorvastatin, baclofen, buspirone, calcium carbonate, vitamin d3, desloratadine-pseudoephedrine, diphenhydramine hcl, docusate sodium, duloxetine, elderberry, fenofibrate, fluticasone, glucosamine-chondroit-vit c-mn, hydrocodone-acetaminophen, hydroxyzine, ibuprofen, levalbuterol, magnesium oxide, melatonin, methylsulfonylmethane, multivitamin, omeprazole, ondansetron, potassium, simethicone, sucralfate, vitamin b-12, and vitamin e. He  reports that he has never smoked. He has never used smokeless tobacco. He reports that he does not drink alcohol and does not use drugs. Mr. SCobbinshas No Known Allergies.   HPI  Today, he is being contacted for a post-procedure assessment.  The patient refers having attained an ongoing 100% relief of the trapezius muscle pain.  However, he also pointed out that his right hip pain is beginning to  come back.  The last time that this was treated was in February 2022 and it provided him with 95-100% relief of the hip pain that lasted until recently when it has began to come back.  I asked him if he wanted to have the procedure scheduled, but he refers that he is still having some benefit and that he would like to wait but he would mind having a PRN order for a right intra-articular hip joint injection.  We will go ahead and take care of that for him today.  Post-procedure evaluation    Procedure:          Anesthesia, Analgesia, Anxiolysis:  Type: Right trapezius and erector Espina muscle Trigger Point Injection (1-2 muscle groups) #1  CPT: 20552 Primary Purpose: Therapeutic Region: Posterior Cervicothoracic Level: Cervico-thoracic Target Area: Lateral trapezius muscle Trigger Point Approach: Percutaneous, ipsilateral approach. Laterality: Right        Type: Local Anesthesia Local Anesthetic: Lidocaine 1-2% Sedation: None  Indication(s):  Analgesia Route: Infiltration (Epworth/IM) IV Access: N/A   Position: Sitting   1. Trigger point of shoulder region (Right)   2. Musculoskeletal neck pain (trapezius)   3. Chronic shoulder pain (Right)   4. Chronic upper back pain (4th area of Pain) (Bilateral)   5. Chronic musculoskeletal pain   6. Myofascial pain syndrome of thoracic spine (serratus muscle) (Right)    NAS-11 Pain score:   Pre-procedure: 4 /10   Post-procedure: 0-No pain/10     Effectiveness:  Initial hour after procedure: 100 %. Subsequent 4-6 hours post-procedure: 100 %. Analgesia past initial 6 hours: 100 %. Ongoing improvement:  Analgesic: The patient indicates currently having an ongoing 100% relief of his trapezius trigger point pain. Function: Mr. Loveall reports improvement in function ROM: Mr. Meyerhoff reports improvement in ROM  Pharmacotherapy Assessment   Opioid Analgesic: Hydrocodone/APAP 5/325, 1 tab PO QD (5 mg/day of hydrocodone) MME/day: 5 mg/day.    Monitoring: [place] PMP: PDMP reviewed during this encounter.       Pharmacotherapy: No side-effects or adverse reactions reported. Compliance: No problems identified. Effectiveness: Clinically acceptable. Plan: Refer to "POC". UDS:  Summary  Date Value Ref Range Status  04/29/2021 Note  Final    Comment:    ==================================================================== [employer] Select 13 (MW) ==================================================================== Test                             Result       Flag       Units  Drug Present and Declared for Prescription Verification   Hydrocodone                    499          EXPECTED   ng/mg creat   Dihydrocodeine                 152          EXPECTED   ng/mg creat   Norhydrocodone                 433          EXPECTED   ng/mg creat    Sources of hydrocodone include scheduled prescription medications.    Dihydrocodeine and norhydrocodone are expected metabolites of    hydrocodone. Dihydrocodeine is also available as a scheduled    prescription medication.  ==================================================================== Test                      Result    Flag   Units      Ref Range   Creatinine  328              mg/dL      >=20 ==================================================================== Declared Medications:  The flagging and interpretation on this report are based on the  following declared medications.  Unexpected results may arise from  inaccuracies in the declared medications.   **Note: The testing scope of this panel includes these medications:   Hydrocodone (Norco)   **Note: The testing scope of this panel does not include the  following reported medications:   Acetaminophen (Tylenol)  Acetaminophen (Norco)  Aluminum hydroxide  Atenolol  Atorvastatin  Baclofen  Buspirone (Buspar)  Cholecalciferol  Chondroitin  Cyanocobalamin  Desloratadine  Diphenhydramine  Docusate  Duloxetine  (Cymbalta)  Fenofibrate  Fluticasone (Flonase)  Glucosamine  Hydroxyzine  Ibuprofen  Levalbuterol  Lidocaine  Magnesium  Melatonin  Methylsulfonylmethane  Multivitamin  Omeprazole  Ondansetron  Potassium  Pseudoephedrine  Simethicone  Sucralfate (Carafate)  Supplement  Vitamin C  Vitamin E ==================================================================== For clinical consultation, please call [phone]. ====================================================================      Laboratory Chemistry Profile   Renal Lab Results  Component Value Date   BUN 10 12/25/2021   CREATININE 0.93 12/25/2021   BCR 11 12/25/2021   GFRAA 84 12/24/2020   GFRNONAA >60 02/08/2021    Hepatic Lab Results  Component Value Date   AST 24 12/25/2021   ALT 35 12/25/2021   ALBUMIN 4.2 12/25/2021   ALKPHOS 75 12/25/2021   LIPASE 28 02/08/2021    Electrolytes Lab Results  Component Value Date   NA 139 12/25/2021   K 4.1 12/25/2021   CL 104 12/25/2021   CALCIUM 9.4 12/25/2021   MG 2.0 11/12/2016    Bone Lab Results  Component Value Date   VD25OH 35.3 12/25/2021   25OHVITD1 23 (L) 11/12/2016   25OHVITD2 <1.0 11/12/2016   25OHVITD3 23 11/12/2016    Inflammation (CRP: Acute Phase) (ESR: Chronic Phase) Lab Results  Component Value Date   CRP 1.4 (H) 11/12/2016   ESRSEDRATE 3 11/12/2016         Note: Above Lab results reviewed.  Imaging  DG PAIN CLINIC C-ARM 1-60 MIN NO REPORT Fluoro was used, but no Radiologist interpretation will be provided.  Please refer to "NOTES" tab for provider progress note.  Assessment  The primary encounter diagnosis was Musculoskeletal neck pain (trapezius). Diagnoses of Trigger point of shoulder region (Right), Myofascial pain syndrome of thoracic spine (serratus muscle) (Right), Muscle spasticity, Enthesopathy of hip region (Right), Chronic hip pain (Right), and Osteoarthritis of hip (Right) were also pertinent to this visit.  Plan of  Care  Problem-specific:  No problem-specific Assessment & Plan notes found for this encounter.  Mr. YANCY KNOBLE has a current medication list which includes the following long-term medication(s): atenolol, atorvastatin, baclofen, calcium carbonate, diphenhydramine hcl, duloxetine, fenofibrate, hydrocodone-acetaminophen, levalbuterol, omeprazole, potassium, simethicone, and sucralfate.  Pharmacotherapy (Medications Ordered): No orders of the defined types were placed in this encounter.  Orders:  Orders Placed This Encounter  Procedures   HIP INJECTION    Standing Status:   Standing    Number of Occurrences:   1    Standing Expiration Date:   07/02/2022    Scheduling Instructions:     Side: Right-sided     Sedation: With Sedation.     Timeframe: PRN.  The patient indicated that he will call when he needs it.   Follow-up plan:   Return for scheduled encounter.     Interventional Therapies  Risk  Complexity Considerations:  Estimated body mass index is 28.35 kg/m as calculated from the following:   Height as of this encounter: 5' 7" (1.702 m).   Weight as of this encounter: 181 lb (82.1 kg). Abnormal UDS (03/27/2020) & (08/20/20) (+) undisclosed carboxy-THC    Planned  Pending:   Diagnostic/therapeutic right trapezius, and erector spinae muscle TPI    Under consideration:   Therapeutic right IA hip joint injection #2  Therapeutic right lumbar facet RFA #2  Diagnostic right cervical facet block (w/o steroids) #2  Possible therapeutic right cervical facet RFA #1    Completed:   Diagnostic/therapeutic right IA hip inj. x1 (12/09/2020) (100/100/95/90-95)  Therapeutic right lumbar facet RFA x2 (11/26/2021) (100/100/75/75)  Palliative right lumbar facet MBB x2 (06/07/2019) (100/100/100 x20 days/>75 x3 months)  Diagnostic/therapeutic left lumbar facet MBB x1 (06/07/2019) (100/100/100 x20 days/>75 x3 months)  Diagnostic/therapeutic left L4-5 LESI x2 (07/25/2018) (n/a) Palliative  left CESI x3 (02/16/2018) (100/100/100/100)  Diagnostic right cervical facet MBB x1 (03/13/2020) (100/100/100) Diagnostic left cervical facet MBB x1 (01/10/2020) (100/100/100)   Therapeutic  Palliative (PRN) options:   Therapeutic lumbar facet RFA   Therapeutic/palliative lumbar facet MBB   Therapeutic L4-5 LESI   Therapeutic CESI   Therapeutic right IA hip joint injection #2  Diagnostic cervical facet MBB #2     Recent Visits Date Type Provider Dept  03/11/22 Procedure visit Milinda Pointer, MD [facility]  03/08/22 Office Visit Milinda Pointer, MD [facility]  01/25/22 Office Visit Milinda Pointer, MD [facility]  Showing recent visits within past 90 days and meeting all other requirements Today's Visits Date Type Provider Dept  04/01/22 Office Visit Milinda Pointer, MD [facility]  Showing today's visits and meeting all other requirements Future Appointments Date Type Provider Dept  04/19/22 Appointment Milinda Pointer, MD [facility]  Showing future appointments within next 90 days and meeting all other requirements  I discussed the assessment and treatment plan with the patient. The patient was provided an opportunity to ask questions and all were answered. The patient agreed with the plan and demonstrated an understanding of the instructions.  Patient advised to call back or seek an in-person evaluation if the symptoms or condition worsens.  Duration of encounter: 15 minutes.  Note by: Gaspar Cola, MD Date: 04/01/2022; Time: 4:20 PM

## 2022-04-01 ENCOUNTER — Ambulatory Visit: Payer: No Typology Code available for payment source | Attending: Pain Medicine | Admitting: Pain Medicine

## 2022-04-01 DIAGNOSIS — M25511 Pain in right shoulder: Secondary | ICD-10-CM | POA: Diagnosis not present

## 2022-04-01 DIAGNOSIS — G8929 Other chronic pain: Secondary | ICD-10-CM

## 2022-04-01 DIAGNOSIS — M62838 Other muscle spasm: Secondary | ICD-10-CM | POA: Diagnosis not present

## 2022-04-01 DIAGNOSIS — M76891 Other specified enthesopathies of right lower limb, excluding foot: Secondary | ICD-10-CM

## 2022-04-01 DIAGNOSIS — M1611 Unilateral primary osteoarthritis, right hip: Secondary | ICD-10-CM

## 2022-04-01 DIAGNOSIS — M542 Cervicalgia: Secondary | ICD-10-CM

## 2022-04-01 DIAGNOSIS — M25551 Pain in right hip: Secondary | ICD-10-CM

## 2022-04-01 DIAGNOSIS — M7918 Myalgia, other site: Secondary | ICD-10-CM | POA: Diagnosis not present

## 2022-04-01 NOTE — Patient Instructions (Signed)
______________________________________________________________________  Preparing for Procedure with Sedation  NOTICE: Due to recent regulatory changes, starting on June 01, 2021, procedures requiring intravenous (IV) sedation will no longer be performed at the [facility].  These types of procedures are required to be performed at [facility] ambulatory surgery facility.  We are very sorry for the inconvenience.  Procedure appointments are limited to planned procedures: No Prescription Refills. No disability issues will be discussed. No medication changes will be discussed.  Instructions: Oral Intake: Do not eat or drink anything for at least 8 hours prior to your procedure. (Exception: Blood Pressure Medication. See below.) Transportation: A driver is required. You may not drive yourself after the procedure. Blood Pressure Medicine: Do not forget to take your blood pressure medicine with a sip of water the morning of the procedure. If your Diastolic (lower reading) is above 100 mmHg, elective cases will be cancelled/rescheduled. Blood thinners: These will need to be stopped for procedures. Notify our staff if you are taking any blood thinners. Depending on which one you take, there will be specific instructions on how and when to stop it. Diabetics on insulin: Notify the staff so that you can be scheduled 1st case in the morning. If your diabetes requires high dose insulin, take only  of your normal insulin dose the morning of the procedure and notify the staff that you have done so. Preventing infections: Shower with an antibacterial soap the morning of your procedure. Build-up your immune system: Take 1000 mg of Vitamin C with every meal (3 times a day) the day prior to your procedure. Antibiotics: Inform the staff if you have a condition or reason that requires you to take antibiotics before dental procedures. Pregnancy: If you are pregnant, call and cancel the procedure. Sickness: If  you have a cold, fever, or any active infections, call and cancel the procedure. Arrival: You must be in the facility at least 30 minutes prior to your scheduled procedure. Children: Do not bring children with you. Dress appropriately: There is always the possibility that your clothing may get soiled. Valuables: Do not bring any jewelry or valuables.  Reasons to call and reschedule or cancel your procedure: (Following these recommendations will minimize the risk of a serious complication.) Surgeries: Avoid having procedures within 2 weeks of any surgery. (Avoid for 2 weeks before or after any surgery). Flu Shots: Avoid having procedures within 2 weeks of a flu shots. (Avoid for 2 weeks before or after immunizations). Barium: Avoid having a procedure within 7-10 days after having had a radiological study involving the use of radiological contrast. (Myelograms, Barium swallow or enema study). Heart attacks: Avoid any elective procedures or surgeries for the initial 6 months after a "Myocardial Infarction" (Heart Attack). Blood thinners: It is imperative that you stop these medications before procedures. Let us know if you if you take any blood thinner.  Infection: Avoid procedures during or within two weeks of an infection (including chest colds or gastrointestinal problems). Symptoms associated with infections include: Localized redness, fever, chills, night sweats or profuse sweating, burning sensation when voiding, cough, congestion, stuffiness, runny nose, sore throat, diarrhea, nausea, vomiting, cold or Flu symptoms, recent or current infections. It is specially important if the infection is over the area that we intend to treat. Heart and lung problems: Symptoms that may suggest an active cardiopulmonary problem include: cough, chest pain, breathing difficulties or shortness of breath, dizziness, ankle swelling, uncontrolled high or unusually low blood pressure, and/or palpitations. If you are    experiencing any of these symptoms, cancel your procedure and contact your primary care physician for an evaluation.  Remember:  Regular Business hours are:  Monday to Thursday 8:00 AM to 4:00 PM  Provider's Schedule: Althea Backs, MD:  Procedure days: Tuesday and Thursday 7:30 AM to 4:00 PM  Bilal Lateef, MD:  Procedure days: Monday and Wednesday 7:30 AM to 4:00 PM ______________________________________________________________________  ____________________________________________________________________________________________  General Risks and Possible Complications  Patient Responsibilities: It is important that you read this as it is part of your informed consent. It is our duty to inform you of the risks and possible complications associated with treatments offered to you. It is your responsibility as a patient to read this and to ask questions about anything that is not clear or that you believe was not covered in this document.  Patient's Rights: You have the right to refuse treatment. You also have the right to change your mind, even after initially having agreed to have the treatment done. However, under this last option, if you wait until the last second to change your mind, you may be charged for the materials used up to that point.  Introduction: Medicine is not an exact science. Everything in Medicine, including the lack of treatment(s), carries the potential for danger, harm, or loss (which is by definition: Risk). In Medicine, a complication is a secondary problem, condition, or disease that can aggravate an already existing one. All treatments carry the risk of possible complications. The fact that a side effects or complications occurs, does not imply that the treatment was conducted incorrectly. It must be clearly understood that these can happen even when everything is done following the highest safety standards.  No treatment: You can choose not to proceed with the  proposed treatment alternative. The "PRO(s)" would include: avoiding the risk of complications associated with the therapy. The "CON(s)" would include: not getting any of the treatment benefits. These benefits fall under one of three categories: diagnostic; therapeutic; and/or palliative. Diagnostic benefits include: getting information which can ultimately lead to improvement of the disease or symptom(s). Therapeutic benefits are those associated with the successful treatment of the disease. Finally, palliative benefits are those related to the decrease of the primary symptoms, without necessarily curing the condition (example: decreasing the pain from a flare-up of a chronic condition, such as incurable terminal cancer).  General Risks and Complications: These are associated to most interventional treatments. They can occur alone, or in combination. They fall under one of the following six (6) categories: no benefit or worsening of symptoms; bleeding; infection; nerve damage; allergic reactions; and/or death. No benefits or worsening of symptoms: In Medicine there are no guarantees, only probabilities. No healthcare provider can ever guarantee that a medical treatment will work, they can only state the probability that it may. Furthermore, there is always the possibility that the condition may worsen, either directly, or indirectly, as a consequence of the treatment. Bleeding: This is more common if the patient is taking a blood thinner, either prescription or over the counter (example: Goody Powders, Fish oil, Aspirin, Garlic, etc.), or if suffering a condition associated with impaired coagulation (example: Hemophilia, cirrhosis of the liver, low platelet counts, etc.). However, even if you do not have one on these, it can still happen. If you have any of these conditions, or take one of these drugs, make sure to notify your treating physician. Infection: This is more common in patients with a compromised  immune system, either due to disease (example:   diabetes, cancer, human immunodeficiency virus [HIV], etc.), or due to medications or treatments (example: therapies used to treat cancer and rheumatological diseases). However, even if you do not have one on these, it can still happen. If you have any of these conditions, or take one of these drugs, make sure to notify your treating physician. Nerve Damage: This is more common when the treatment is an invasive one, but it can also happen with the use of medications, such as those used in the treatment of cancer. The damage can occur to small secondary nerves, or to large primary ones, such as those in the spinal cord and brain. This damage may be temporary or permanent and it may lead to impairments that can range from temporary numbness to permanent paralysis and/or brain death. Allergic Reactions: Any time a substance or material comes in contact with our body, there is the possibility of an allergic reaction. These can range from a mild skin rash (contact dermatitis) to a severe systemic reaction (anaphylactic reaction), which can result in death. Death: In general, any medical intervention can result in death, most of the time due to an unforeseen complication. ____________________________________________________________________________________________  

## 2022-04-18 NOTE — Progress Notes (Unsigned)
PROVIDER NOTE: Information contained herein reflects review and annotations entered in association with encounter. Interpretation of such information and data should be left to medically-trained personnel. Information provided to patient can be located elsewhere in the medical record under "Patient Instructions". Document created using STT-dictation technology, any transcriptional errors that may result from process are unintentional.    Patient: Jose Washington  Service Category: E/M  Provider: Gaspar Cola, MD  DOB: 08-30-1966  DOS: 04/19/2022  Specialty: Interventional Pain Management  MRN: [id number]  Setting: Ambulatory outpatient  PCP: Jon Billings, NP  Type: Established Patient    Referring Provider: Jon Billings, NP  Location: Office  Delivery: Face-to-face     HPI  [name], a 56 y.o. year old male, is here today because of his Chronic pain syndrome [G89.4]. Jose Washington primary complain today is No chief complaint on file. Last encounter: My last encounter with him was on 04/01/2022. Pertinent problems: Jose Washington has Chronic neck pain (3ry area of Pain) (Bilateral) (L>R); Chronic pain syndrome; Chronic low back pain (1ry area of Pain) (Bilateral) (L>R) w/o sciatica ; Chronic upper back pain (4th area of Pain) (Bilateral); Chronic shoulder pain (Bilateral) (L>R); Osteoarthritis of AC (acromioclavicular) joint (Right); Chronic sacroiliac joint pain (Bilateral) (L>R); Muscle spasticity; Cervical DDD (C4-5, C5-6, C6-7 and C7-T1); Cervical foraminal stenosis (Bilateral: C5-6 & C6-7, Left: C4-5 & C7-T1); Cervical radiculitis (Bilateral) (L>R); Cervical facet syndrome (Bilateral) (L>R); Cervical spondylosis; Musculoskeletal neck pain (trapezius); Chronic knee pain (Left); Chronic lower extremity pain (2ry area of Pain) (Left); Grade 1 Retrolisthesis of L3 over L4; Lumbar facet arthropathy (Bilateral); Lumbar facet osteoarthritis; Lumbar facet syndrome (Bilateral) (R>L);  Lumbar spondylosis; Chronic shoulder pain (Right); Suprascapular neuropathy (Right); DDD (degenerative disc disease), lumbosacral; Lumbar facet hypertrophy (Multilevel) (Bilateral); Spondylosis without myelopathy or radiculopathy, lumbosacral region; Inflammatory spondylopathy of lumbosacral region [facility]); Chronic lower extremity pain (Right); Chronic musculoskeletal pain; Abnormal MRI, lumbar spine (09/07/2017); Cervicalgia (Bilateral) (L>R); Spondylosis without myelopathy or radiculopathy, cervical region; Myofascial pain syndrome of thoracic spine (serratus muscle) (Right); Chronic flank pain (Right); Abnormal result on screening urine test (03/27/20 & 08/20/20); Chronic hip pain (Bilateral); Chronic hip pain (Right); Enthesopathy of hip region (Right); Osteoarthritis of hip (Right); and Trigger point of shoulder region (Right) on their pertinent problem list. Pain Assessment: Severity of   is reported as a  /10. Location:    / . Onset:  . Quality:  . Timing:  . Modifying factor(s):  Marland Kitchen Vitals:  vitals were not taken for this visit.   Reason for encounter: medication management. ***   Nonopioids transferred 08/20/2020: Baclofen  Pharmacotherapy Assessment  Analgesic: Hydrocodone/APAP 5/325, 1 tab PO QD (5 mg/day of hydrocodone) MME/day: 5 mg/day.   Monitoring: [place] PMP: PDMP reviewed during this encounter.       Pharmacotherapy: No side-effects or adverse reactions reported. Compliance: No problems identified. Effectiveness: Clinically acceptable.  No notes on file  UDS:  Summary  Date Value Ref Range Status  04/29/2021 Note  Final    Comment:    ==================================================================== [employer] 13 (MW) ==================================================================== Test                             Result       Flag       Units  Drug Present and Declared for Prescription Verification   Hydrocodone  499          EXPECTED   ng/mg  creat   Dihydrocodeine                 152          EXPECTED   ng/mg creat   Norhydrocodone                 433          EXPECTED   ng/mg creat    Sources of hydrocodone include scheduled prescription medications.    Dihydrocodeine and norhydrocodone are expected metabolites of    hydrocodone. Dihydrocodeine is also available as a scheduled    prescription medication.  ==================================================================== Test                      Result    Flag   Units      Ref Range   Creatinine              328              mg/dL      >=20 ==================================================================== Declared Medications:  The flagging and interpretation on this report are based on the  following declared medications.  Unexpected results may arise from  inaccuracies in the declared medications.   **Note: The testing scope of this panel includes these medications:   Hydrocodone (Norco)   **Note: The testing scope of this panel does not include the  following reported medications:   Acetaminophen (Tylenol)  Acetaminophen (Norco)  Aluminum hydroxide  Atenolol  Atorvastatin  Baclofen  Buspirone (Buspar)  Cholecalciferol  Chondroitin  Cyanocobalamin  Desloratadine  Diphenhydramine  Docusate  Duloxetine (Cymbalta)  Fenofibrate  Fluticasone (Flonase)  Glucosamine  Hydroxyzine  Ibuprofen  Levalbuterol  Lidocaine  Magnesium  Melatonin  Methylsulfonylmethane  Multivitamin  Omeprazole  Ondansetron  Potassium  Pseudoephedrine  Simethicone  Sucralfate (Carafate)  Supplement  Vitamin C  Vitamin E ==================================================================== For clinical consultation, please call [phone]. ====================================================================      ROS  Constitutional: Denies any fever or chills Gastrointestinal: No reported hemesis, hematochezia, vomiting, or acute GI distress Musculoskeletal:  Denies any acute onset joint swelling, redness, loss of ROM, or weakness Neurological: No reported episodes of acute onset apraxia, aphasia, dysarthria, agnosia, amnesia, paralysis, loss of coordination, or loss of consciousness  Medication Review  Calcium Carbonate, DULoxetine, Elderberry, Glucosamine-Chondroit-Vit C-Mn, HYDROcodone-acetaminophen, Methylsulfonylmethane, Potassium, Vitamin D3, acetaminophen, atenolol, atorvastatin, baclofen, busPIRone, desloratadine-pseudoephedrine, diphenhydrAMINE HCl, docusate sodium, fenofibrate, fluticasone, hydrOXYzine, ibuprofen, levalbuterol, magnesium oxide, melatonin, multivitamin, omeprazole, ondansetron, simethicone, sucralfate, vitamin B-12, and vitamin E  History Review  Allergy: Mr. Rathert has No Known Allergies. Drug: Mr. Conde  reports no history of drug use. Alcohol:  reports no history of alcohol use. Tobacco:  reports that he has never smoked. He has never used smokeless tobacco. Social: Mr. Eidson  reports that he has never smoked. He has never used smokeless tobacco. He reports that he does not drink alcohol and does not use drugs. Medical:  has a past medical history of Allergy, Anxiety, Chronic duodenal ulcer with hemorrhage (2012), Chronic neck pain, Depression, GERD (gastroesophageal reflux disease), Hyperlipidemia, Hypertension, and Microscopic hematuria. Surgical: Mr. Edler  has a past surgical history that includes eye muscle repair (1972 and 1975); bLEEDING ULCER (2012); Flexible sigmoidoscopy; Colonoscopy with propofol (N/A, 03/24/2021); and Esophagogastroduodenoscopy (egd) with propofol (N/A, 03/24/2021). Family: family history includes Allergies in his brother; Cancer in his maternal grandfather and mother; Dementia in his  father and paternal grandfather; Depression in his father; Diabetes in his mother; Mental illness in his father; Stroke in his paternal grandfather.  Laboratory Chemistry Profile   Renal Lab Results   Component Value Date   BUN 10 12/25/2021   CREATININE 0.93 12/25/2021   BCR 11 12/25/2021   GFRAA 84 12/24/2020   GFRNONAA >60 02/08/2021    Hepatic Lab Results  Component Value Date   AST 24 12/25/2021   ALT 35 12/25/2021   ALBUMIN 4.2 12/25/2021   ALKPHOS 75 12/25/2021   LIPASE 28 02/08/2021    Electrolytes Lab Results  Component Value Date   NA 139 12/25/2021   K 4.1 12/25/2021   CL 104 12/25/2021   CALCIUM 9.4 12/25/2021   MG 2.0 11/12/2016    Bone Lab Results  Component Value Date   VD25OH 35.3 12/25/2021   25OHVITD1 23 (L) 11/12/2016   25OHVITD2 <1.0 11/12/2016   25OHVITD3 23 11/12/2016    Inflammation (CRP: Acute Phase) (ESR: Chronic Phase) Lab Results  Component Value Date   CRP 1.4 (H) 11/12/2016   ESRSEDRATE 3 11/12/2016         Note: Above Lab results reviewed.  Recent Imaging Review  DG PAIN CLINIC C-ARM 1-60 MIN NO REPORT Fluoro was used, but no Radiologist interpretation will be provided.  Please refer to "NOTES" tab for provider progress note. Note: Reviewed        Physical Exam  General appearance: Well nourished, well developed, and well hydrated. In no apparent acute distress Mental status: Alert, oriented x 3 (person, place, & time)       Respiratory: No evidence of acute respiratory distress Eyes: PERLA Vitals: There were no vitals taken for this visit. BMI: Estimated body mass index is 28.19 kg/m as calculated from the following:   Height as of 03/11/22: '5\' 7"'  (1.702 m).   Weight as of 03/11/22: 180 lb (81.6 kg). Ideal: Patient weight not recorded  Assessment   Diagnosis Status  1. Chronic pain syndrome   2. Chronic low back pain (1ry area of Pain) (Bilateral) (L>R) w/o sciatica    3. Chronic lower extremity pain (2ry area of Pain) (Left)   4. Chronic neck pain (3ry area of Pain) (Bilateral) (L>R)   5. Opiate use (5 MME/Day)   6. Lumbar facet syndrome (Bilateral) (R>L)   7. Chronic upper back pain (4th area of Pain) (Bilateral)  (L>R)   8. Chronic shoulder pain (Bilateral) (L>R)   9. Pharmacologic therapy   10. Chronic use of opiate for therapeutic purpose   11. Encounter for chronic pain management   12. Encounter for medication management    Controlled Controlled Controlled   Updated Problems: No problems updated.  Plan of Care  Problem-specific:  No problem-specific Assessment & Plan notes found for this encounter.  Mr. MANVEER GOMES has a current medication list which includes the following long-term medication(s): atenolol, atorvastatin, baclofen, calcium carbonate, diphenhydramine hcl, duloxetine, fenofibrate, hydrocodone-acetaminophen, levalbuterol, omeprazole, potassium, simethicone, and sucralfate.  Pharmacotherapy (Medications Ordered): No orders of the defined types were placed in this encounter.  Orders:  No orders of the defined types were placed in this encounter.  Follow-up plan:   No follow-ups on file.     Interventional Therapies  Risk  Complexity Considerations:   Estimated body mass index is 28.35 kg/m as calculated from the following:   Height as of this encounter: '5\' 7"'  (1.702 m).   Weight as of this encounter: 181 lb (82.1 kg). Abnormal UDS (  03/27/2020) & (08/20/20) (+) undisclosed carboxy-THC    Planned  Pending:   Diagnostic/therapeutic right trapezius, and erector spinae muscle TPI    Under consideration:   Therapeutic right IA hip joint injection #2  Therapeutic right lumbar facet RFA #2  Diagnostic right cervical facet block (w/o steroids) #2  Possible therapeutic right cervical facet RFA #1    Completed:   Diagnostic/therapeutic right IA hip inj. x1 (12/09/2020) (100/100/95/90-95)  Therapeutic right lumbar facet RFA x2 (11/26/2021) (100/100/75/75)  Palliative right lumbar facet MBB x2 (06/07/2019) (100/100/100 x20 days/>75 x3 months)  Diagnostic/therapeutic left lumbar facet MBB x1 (06/07/2019) (100/100/100 x20 days/>75 x3 months)  Diagnostic/therapeutic left  L4-5 LESI x2 (07/25/2018) (n/a) Palliative left CESI x3 (02/16/2018) (100/100/100/100)  Diagnostic right cervical facet MBB x1 (03/13/2020) (100/100/100) Diagnostic left cervical facet MBB x1 (01/10/2020) (100/100/100)   Therapeutic  Palliative (PRN) options:   Therapeutic lumbar facet RFA   Therapeutic/palliative lumbar facet MBB   Therapeutic L4-5 LESI   Therapeutic CESI   Therapeutic right IA hip joint injection #2  Diagnostic cervical facet MBB #2     Recent Visits Date Type Provider Dept  04/01/22 Office Visit Milinda Pointer, MD [facility]  03/11/22 Procedure visit Milinda Pointer, MD [facility]  03/08/22 Office Visit Milinda Pointer, MD [facility]  01/25/22 Office Visit Milinda Pointer, MD [facility]  Showing recent visits within past 90 days and meeting all other requirements Future Appointments Date Type Provider Dept  04/19/22 Appointment Milinda Pointer, MD [facility]  Showing future appointments within next 90 days and meeting all other requirements  I discussed the assessment and treatment plan with the patient. The patient was provided an opportunity to ask questions and all were answered. The patient agreed with the plan and demonstrated an understanding of the instructions.  Patient advised to call back or seek an in-person evaluation if the symptoms or condition worsens.  Duration of encounter: *** minutes.  Note by: Gaspar Cola, MD Date: 04/19/2022; Time: 11:12 AM

## 2022-04-19 ENCOUNTER — Ambulatory Visit: Payer: No Typology Code available for payment source | Attending: Pain Medicine | Admitting: Pain Medicine

## 2022-04-19 ENCOUNTER — Encounter: Payer: Self-pay | Admitting: Pain Medicine

## 2022-04-19 ENCOUNTER — Other Ambulatory Visit: Payer: Self-pay

## 2022-04-19 VITALS — BP 150/98 | HR 103 | Temp 97.6°F | Resp 16 | Ht 67.0 in | Wt 175.0 lb

## 2022-04-19 DIAGNOSIS — Z79899 Other long term (current) drug therapy: Secondary | ICD-10-CM | POA: Insufficient documentation

## 2022-04-19 DIAGNOSIS — M79605 Pain in left leg: Secondary | ICD-10-CM | POA: Diagnosis present

## 2022-04-19 DIAGNOSIS — F1190 Opioid use, unspecified, uncomplicated: Secondary | ICD-10-CM | POA: Diagnosis present

## 2022-04-19 DIAGNOSIS — Z79891 Long term (current) use of opiate analgesic: Secondary | ICD-10-CM | POA: Insufficient documentation

## 2022-04-19 DIAGNOSIS — M25511 Pain in right shoulder: Secondary | ICD-10-CM | POA: Insufficient documentation

## 2022-04-19 DIAGNOSIS — M25512 Pain in left shoulder: Secondary | ICD-10-CM | POA: Diagnosis present

## 2022-04-19 DIAGNOSIS — M545 Low back pain, unspecified: Secondary | ICD-10-CM | POA: Diagnosis present

## 2022-04-19 DIAGNOSIS — G894 Chronic pain syndrome: Secondary | ICD-10-CM | POA: Insufficient documentation

## 2022-04-19 DIAGNOSIS — M47816 Spondylosis without myelopathy or radiculopathy, lumbar region: Secondary | ICD-10-CM | POA: Diagnosis present

## 2022-04-19 DIAGNOSIS — M549 Dorsalgia, unspecified: Secondary | ICD-10-CM | POA: Diagnosis present

## 2022-04-19 DIAGNOSIS — G8929 Other chronic pain: Secondary | ICD-10-CM | POA: Diagnosis present

## 2022-04-19 DIAGNOSIS — M542 Cervicalgia: Secondary | ICD-10-CM | POA: Diagnosis present

## 2022-04-19 MED ORDER — HYDROCODONE-ACETAMINOPHEN 5-325 MG PO TABS: 1.0000 | ORAL_TABLET | Freq: Every day | ORAL | 0 refills | Status: DC | PRN

## 2022-04-19 NOTE — Progress Notes (Signed)
Nursing Pain Medication Assessment:  Safety precautions to be maintained throughout the outpatient stay will include: orient to surroundings, keep bed in low position, maintain call bell within reach at all times, provide assistance with transfer out of bed and ambulation.  Medication Inspection Compliance: Pill count conducted under aseptic conditions, in front of the patient. Neither the pills nor the bottle was removed from the patient's sight at any time. Once count was completed pills were immediately returned to the patient in their original bottle.  Medication: Hydrocodone/APAP Pill/Patch Count:  5 of 30 pills remain Pill/Patch Appearance: Markings consistent with prescribed medication Bottle Appearance: Standard pharmacy container. Clearly labeled. Filled Date: 05 / 26 / 2023 Last Medication intake:  Yesterday

## 2022-04-19 NOTE — Patient Instructions (Signed)
____________________________________________________________________________________________  Medication Rules  Purpose: To inform patients, and their family members, of our rules and regulations.  Applies to: All patients receiving prescriptions (written or electronic).  Pharmacy of record: Pharmacy where electronic prescriptions will be sent. If written prescriptions are taken to a different pharmacy, please inform the nursing staff. The pharmacy listed in the electronic medical record should be the one where you would like electronic prescriptions to be sent.  Electronic prescriptions: In compliance with the [place] Strengthen Opioid Misuse Prevention (STOP) Act of 2017 (Session Law 2017-74/H243), effective November 01, 2018, all controlled substances must be electronically prescribed. Calling prescriptions to the pharmacy will cease to exist.  Prescription refills: Only during scheduled appointments. Applies to all prescriptions.  NOTE: The following applies primarily to controlled substances (Opioid* Pain Medications).   Type of encounter (visit): For patients receiving controlled substances, face-to-face visits are required. (Not an option or up to the patient.)  Patient's responsibilities: Pain Pills: Bring all pain pills to every appointment (except for procedure appointments). Pill Bottles: Bring pills in original pharmacy bottle. Always bring the newest bottle. Bring bottle, even if empty. Medication refills: You are responsible for knowing and keeping track of what medications you take and those you need refilled. The day before your appointment: write a list of all prescriptions that need to be refilled. The day of the appointment: give the list to the admitting nurse. Prescriptions will be written only during appointments. No prescriptions will be written on procedure days. If you forget a medication: it will not be "Called in", "Faxed", or "electronically sent". You will  need to get another appointment to get these prescribed. No early refills. Do not call asking to have your prescription filled early. Prescription Accuracy: You are responsible for carefully inspecting your prescriptions before leaving our office. Have the discharge nurse carefully go over each prescription with you, before taking them home. Make sure that your name is accurately spelled, that your address is correct. Check the name and dose of your medication to make sure it is accurate. Check the number of pills, and the written instructions to make sure they are clear and accurate. Make sure that you are given enough medication to last until your next medication refill appointment. Taking Medication: Take medication as prescribed. When it comes to controlled substances, taking less pills or less frequently than prescribed is permitted and encouraged. Never take more pills than instructed. Never take medication more frequently than prescribed.  Inform other Doctors: Always inform, all of your healthcare providers, of all the medications you take. Pain Medication from other Providers: You are not allowed to accept any additional pain medication from any other Doctor or Healthcare provider. There are two exceptions to this rule. (see below) In the event that you require additional pain medication, you are responsible for notifying us, as stated below. Cough Medicine: Often these contain an opioid, such as codeine or hydrocodone. Never accept or take cough medicine containing these opioids if you are already taking an opioid* medication. The combination may cause respiratory failure and death. Medication Agreement: You are responsible for carefully reading and following our Medication Agreement. This must be signed before receiving any prescriptions from our practice. Safely store a copy of your signed Agreement. Violations to the Agreement will result in no further prescriptions. (Additional copies of our  Medication Agreement are available upon request.) Laws, Rules, & Regulations: All patients are expected to follow all Federal and State Laws, Statutes, Rules, & Regulations. Ignorance of   the Laws does not constitute a valid excuse.  Illegal drugs and Controlled Substances: The use of illegal substances (including, but not limited to marijuana and its derivatives) and/or the illegal use of any controlled substances is strictly prohibited. Violation of this rule may result in the immediate and permanent discontinuation of any and all prescriptions being written by our practice. The use of any illegal substances is prohibited. Adopted CDC guidelines & recommendations: Target dosing levels will be at or below 60 MME/day. Use of benzodiazepines** is not recommended.  Exceptions: There are only two exceptions to the rule of not receiving pain medications from other Healthcare Providers. Exception #1 (Emergencies): In the event of an emergency (i.e.: accident requiring emergency care), you are allowed to receive additional pain medication. However, you are responsible for: As soon as you are able, call our office [phone], at any time of the day or night, and leave a message stating your name, the date and nature of the emergency, and the name and dose of the medication prescribed. In the event that your call is answered by a member of our staff, make sure to document and save the date, time, and the name of the person that took your information.  Exception #2 (Planned Surgery): In the event that you are scheduled by another doctor or dentist to have any type of surgery or procedure, you are allowed (for a period no longer than 30 days), to receive additional pain medication, for the acute post-op pain. However, in this case, you are responsible for picking up a copy of our "Post-op Pain Management for Surgeons" handout, and giving it to your surgeon or dentist. This document is available at our office, and  does not require an appointment to obtain it. Simply go to our office during business hours (Monday-Thursday from 8:00 AM to 4:00 PM) (Friday 8:00 AM to 12:00 Noon) or if you have a scheduled appointment with us, prior to your surgery, and ask for it by name. In addition, you are responsible for: calling our office [phone], at any time of the day or night, and leaving a message stating your name, name of your surgeon, type of surgery, and date of procedure or surgery. Failure to comply with your responsibilities may result in termination of therapy involving the controlled substances. Medication Agreement Violation. Following the above rules, including your responsibilities will help you in avoiding a Medication Agreement Violation ("Breaking your Pain Medication Contract").  *Opioid medications include: morphine, codeine, oxycodone, oxymorphone, hydrocodone, hydromorphone, meperidine, tramadol, tapentadol, buprenorphine, fentanyl, methadone. **Benzodiazepine medications include: diazepam (Valium), alprazolam (Xanax), clonazepam (Klonopine), lorazepam (Ativan), clorazepate (Tranxene), chlordiazepoxide (Librium), estazolam (Prosom), oxazepam (Serax), temazepam (Restoril), triazolam (Halcion) (Last updated: 07/29/2021) ____________________________________________________________________________________________  ____________________________________________________________________________________________  Medication Recommendations and Reminders  Applies to: All patients receiving prescriptions (written and/or electronic).  Medication Rules & Regulations: These rules and regulations exist for your safety and that of others. They are not flexible and neither are we. Dismissing or ignoring them will be considered "non-compliance" with medication therapy, resulting in complete and irreversible termination of such therapy. (See document titled "Medication Rules" for more details.) In all conscience,  because of safety reasons, we cannot continue providing a therapy where the patient does not follow instructions.  Pharmacy of record:  Definition: This is the pharmacy where your electronic prescriptions will be sent.  We do not endorse any particular pharmacy, however, we have experienced problems with [facility] not securing enough medication supply for the community. We do not restrict you   in your choice of pharmacy. However, once we write for your prescriptions, we will NOT be re-sending more prescriptions to fix restricted supply problems created by your pharmacy, or your insurance.  The pharmacy listed in the electronic medical record should be the one where you want electronic prescriptions to be sent. If you choose to change pharmacy, simply notify our nursing staff.  Recommendations: Keep all of your pain medications in a safe place, under lock and key, even if you live alone. We will NOT replace lost, stolen, or damaged medication. After you fill your prescription, take 1 week's worth of pills and put them away in a safe place. You should keep a separate, properly labeled bottle for this purpose. The remainder should be kept in the original bottle. Use this as your primary supply, until it runs out. Once it's gone, then you know that you have 1 week's worth of medicine, and it is time to come in for a prescription refill. If you do this correctly, it is unlikely that you will ever run out of medicine. To make sure that the above recommendation works, it is very important that you make sure your medication refill appointments are scheduled at least 1 week before you run out of medicine. To do this in an effective manner, make sure that you do not leave the office without scheduling your next medication management appointment. Always ask the nursing staff to show you in your prescription , when your medication will be running out. Then arrange for the receptionist to get you a return appointment,  at least 7 days before you run out of medicine. Do not wait until you have 1 or 2 pills left, to come in. This is very poor planning and does not take into consideration that we may need to cancel appointments due to bad weather, sickness, or emergencies affecting our staff. DO NOT ACCEPT A "Partial Fill": If for any reason your pharmacy does not have enough pills/tablets to completely fill or refill your prescription, do not allow for a "partial fill". The law allows the pharmacy to complete that prescription within 72 hours, without requiring a new prescription. If they do not fill the rest of your prescription within those 72 hours, you will need a separate prescription to fill the remaining amount, which we will NOT provide. If the reason for the partial fill is your insurance, you will need to talk to the pharmacist about payment alternatives for the remaining tablets, but again, DO NOT ACCEPT A PARTIAL FILL, unless you can trust your pharmacist to obtain the remainder of the pills within 72 hours.  Prescription refills and/or changes in medication(s):  Prescription refills, and/or changes in dose or medication, will be conducted only during scheduled medication management appointments. (Applies to both, written and electronic prescriptions.) No refills on procedure days. No medication will be changed or started on procedure days. No changes, adjustments, and/or refills will be conducted on a procedure day. Doing so will interfere with the diagnostic portion of the procedure. No phone refills. No medications will be "called into the pharmacy". No Fax refills. No weekend refills. No Holliday refills. No after hours refills.  Remember:  Business hours are:  Monday to Thursday 8:00 AM to 4:00 PM Provider's Schedule: Lilas Diefendorf, MD - Appointments are:  Medication management: Monday and Wednesday 8:00 AM to 4:00 PM Procedure day: Tuesday and Thursday 7:30 AM to 4:00 PM Bilal Lateef, MD -  Appointments are:  Medication management: Tuesday and Thursday 8:00   AM to 4:00 PM Procedure day: Monday and Wednesday 7:30 AM to 4:00 PM (Last update: 05/21/2020) ____________________________________________________________________________________________  ____________________________________________________________________________________________  Pharmacy Shortages of Pain Medication   Introduction Shockingly as it may seem, .  "No U.[place] Supreme Court decision has ever interpreted the Constitution as guaranteeing a right to health care for all Americans." - https://huff.com/  "With respect to human rights, the [place] has no formally codified right to health, nor does it participate in a human rights treaty that specifies a right to health." - Scott J. Schweikart, JD, MBE  Situation By now, most of our patients have had the experience of being told by their pharmacist that they do not have enough medication to cover their prescription. If you have not had this experience, just know that you soon will.  Problem There appears to be a shortage of these medications, either at the national level or locally. This is happening with all pharmacies. When there is not enough medication, patients are offered a partial fill and they are told that they will try to get the rest of the medicine for them at a later time. If they do not have enough for even a partial fill, the pharmacists are telling the patients to call us (the prescribing physicians) to request that we send another prescription to another pharmacy to get the medicine.   This reordering of a controlled substance creates documentation problems where additional paperwork needs to be created to explain why two prescriptions for the same period of time and the same medicine are being prescribed to the same patient. It also creates situations where the last appointment note  does not accurately reflect when and what prescriptions were given to a patient. This leads to prescribing errors down the line, in subsequent follow-up visits.   [employer] of Pharmacy ([employer]) Research revealed that Surveyor, quantity .1806 (21 NCAC 46.1806) authorizes pharmacists to the transfer of prescriptions among pharmacies, and it sets forth procedural and recordkeeping requirements for doing so. However, this requires the pharmacist to complete the previously mentioned procedural paperwork to accomplish the transfer. As it turns out, it is much easier for them to have the prescribing physicians do the work.   Possible solutions 1. Have the [facility] Assembly add a provision to the "STOP ACT" (the law that mandates how controlled substances are prescribed) where there is an exception to the electronic prescribing rule that states that in the event there are shortages of medications the physicians are allowed to use written prescriptions as opposed to electronic ones. This would allow patients to take their prescriptions to a different pharmacy that may have enough medication available to fill the prescription. The problem is that currently there is a law that does not allow for written prescriptions, with the exception of instances where the electronic medical record is down due to technical issues.  2. Have [place] Congress ease the pressure on pharmaceutical companies, allowing them to produce enough quantities of the medication to adequately supply the population. 3. Have pharmacies keep enough stocks of these medications to cover their client base.  4. Have the [facility] Assembly add a provision to the "STOP ACT" where they ease the regulations surrounding the transfer of controlled substances between pharmacies, so as to simplify the transfer of supplies. As an alternative, develop a system to allow patients to obtain the remainder of their prescription at another  one of their pharmacies or at an associate pharmacy.   How  this shortage will affect you.  The one thing that is abundantly clear is that this is a pharmacy supply problem  and not a prescriber problem. The job of the prescriber is to evaluate and monitor the patients for the appropriate indications to the use of these medicines, the monitoring of their use and the prescribing of the appropriate dose and regimen. It is not the job of the prescriber to provide or dispense the actual medication. By law, this is the job of the pharmacies and pharmacists. It is certainly not the job of the prescriber to solve the supply problems.   Due to the above problems we are no longer taking patients to write for their pain medication. We will continue to evaluate for appropriate indications and we may provide recommendations regarding medication, dose, and schedule, as well as monitoring recommendations, however, we will not be taking over the actual prescribing of these substances. On those patients where we are treating their chronic pain with interventional therapies, exceptions will be considered on a case by case basis. At this time, we will try to continue providing this supplemental service to those patients we have been managing in the past. However, as of August 1st, 2023, we no longer will be sending additional prescriptions to other pharmacies for the purpose of solving their supply problems. Once we send a prescription to a pharmacy, we will not be resending it again to another pharmacy to cover for their shortages.   What to do. Write as many letters as you can. Recruit the help of family members in writing these letters. Below are some of the places where you can write to make your voice heard. Let them know what the problem is and push them to look for solutions.   Search internet for: "[employer] find your legislators" NoseSwap.is  Search internet for: "[employer] commissioner complaints" Starlas.fi  Search internet for: "[place] complaints" https://www.hernandez-brewer.com/.htm  Search internet for: "[facility] pharmacy complaints" Email [facility] Customer Relations woondaal.com.jsp?callType=store  Search internet for: Programme researcher, broadcasting/film/video customer service complaints" https://www.[facility].com/topic/marketing/contactus/contactus_customerservice.jsp  ____________________________________________________________________________________________  ____________________________________________________________________________________________  CBD (cannabidiol) & Delta-8 (Delta-8 tetrahydrocannabinol) WARNING  Intro: Cannabidiol (CBD) and tetrahydrocannabinol (THC), are two natural compounds found in plants of the Cannabis genus. They can both be extracted from hemp or cannabis. Hemp and cannabis come from the Cannabis sativa plant. Both compounds interact with your body's endocannabinoid system, but they have very different effects. CBD does not produce the high sensation associated with cannabis. Delta-8 tetrahydrocannabinol, also known as delta-8 THC, is a psychoactive substance found in the Cannabis sativa plant, of which marijuana and hemp are two varieties. THC is responsible for the high associated with the illicit use of marijuana.  Applicable to: All individuals currently taking or considering taking CBD (cannabidiol) and, more important, all patients taking opioid analgesic controlled substances (pain medication). (Example: oxycodone; oxymorphone; hydrocodone; hydromorphone; morphine; methadone; tramadol; tapentadol; fentanyl; buprenorphine; butorphanol; dextromethorphan; meperidine; codeine; etc.)  Legal status: CBD remains a Schedule I drug prohibited for any use. CBD is illegal with one exception. In the [place], CBD has a  limited Transport planner (FDA) approval for the treatment of two specific types of epilepsy disorders. Only one CBD product has been approved by the FDA for this purpose: "Epidiolex". FDA is aware that some companies are marketing products containing cannabis and cannabis-derived compounds in ways that violate the [employer], Drug and Cosmetic Act [facility] Act) and that may put the health and  safety of consumers at risk. The FDA, a Federal agency, has not enforced the CBD status since 2018. UPDATE: (12/18/2021) The Drug Enforcement Agency ([place]) issued a letter stating that "delta" cannabinoids, including Delta-8-THCO and Delta-9-THCO, synthetically derived from hemp do not qualify as hemp and will be viewed as Schedule I drugs. (Schedule I drugs, substances, or chemicals are defined as drugs with no currently accepted medical use and a high potential for abuse. Some examples of Schedule I drugs are: heroin, lysergic acid diethylamide (LSD), marijuana (cannabis), 3,4-methylenedioxymethamphetamine (ecstasy), methaqualone, and peyote.) (https://jennings.com/)  Legality: Some manufacturers ship CBD products nationally, which is illegal. Often such products are sold online and are therefore available throughout the country. CBD is openly sold in head shops and health food stores in some states where such sales have not been explicitly legalized. Selling unapproved products with unsubstantiated therapeutic claims is not only a violation of the law, but also can put patients at risk, as these products have not been proven to be safe or effective. Federal illegality makes it difficult to conduct research on CBD.  Reference: "FDA Regulation of Cannabis and Cannabis-Derived Products, Including Cannabidiol (CBD)" - SeekArtists.com.pt  Warning: CBD is not FDA approved and has not undergo the same  manufacturing controls as prescription drugs.  This means that the purity and safety of available CBD may be questionable. Most of the time, despite manufacturer's claims, it is contaminated with THC (delta-9-tetrahydrocannabinol - the chemical in marijuana responsible for the "HIGH").  When this is the case, the [facility]: Lambert Rhonda W contaminant will trigger a positive urine drug screen (UDS) test for Marijuana (carboxy-THC). Because a positive UDS for any illicit substance is a violation of our medication agreement, your opioid analgesics (pain medicine) may be permanently discontinued. The FDA recently put out a warning about 5 things that everyone should be aware of regarding Delta-8 THC: Delta-8 THC products have not been evaluated or approved by the FDA for safe use and may be marketed in ways that put the public health at risk. The FDA has received adverse event reports involving delta-8 THC-containing products. Delta-8 THC has psychoactive and intoxicating effects. Delta-8 THC manufacturing often involve use of potentially harmful chemicals to create the concentrations of delta-8 THC claimed in the marketplace. The final delta-8 THC product may have potentially harmful by-products (contaminants) due to the chemicals used in the process. Manufacturing of delta-8 THC products may occur in uncontrolled or unsanitary settings, which may lead to the presence of unsafe contaminants or other potentially harmful substances. Delta-8 THC products should be kept out of the reach of children and pets.  MORE ABOUT CBD  General Information: CBD was discovered in 5 and it is a derivative of the cannabis sativa genus plants (Marijuana and Hemp). It is one of the 113 identified substances found in Marijuana. It accounts for up to 40% of the plant's extract. As of 2018, preliminary clinical studies on CBD included research for the treatment of anxiety, movement disorders, and pain. CBD is available and consumed in multiple forms,  including inhalation of smoke or vapor, as an aerosol spray, and by mouth. It may be supplied as an oil containing CBD, capsules, dried cannabis, or as a liquid solution. CBD is thought not to be as psychoactive as THC (delta-9-tetrahydrocannabinol - the chemical in marijuana responsible for the "HIGH"). Studies suggest that CBD may interact with different biological target receptors in the body, including cannabinoid and other neurotransmitter receptors. As of 2018 the mechanism of action for its biological effects  has not been determined.  Side-effects  Adverse reactions: Dry mouth, diarrhea, decreased appetite, fatigue, drowsiness, malaise, weakness, sleep disturbances, and others.  Drug interactions: CBC may interact with other medications such as blood-thinners. Because CBD causes drowsiness on its own, it also increases the drowsiness caused by other medications, including antihistamines (such as Benadryl), benzodiazepines (Xanax, Ativan, Valium), antipsychotics, antidepressants and opioids, as well as alcohol and supplements such as kava, melatonin and St. John's Wort. Be cautious with the following combinations:   Brivaracetam (Briviact) Brivaracetam is changed and broken down by the body. CBD might decrease how quickly the body breaks down brivaracetam. This might increase levels of brivaracetam in the body.  Caffeine Caffeine is changed and broken down by the body. CBD might decrease how quickly the body breaks down caffeine. This might increase levels of caffeine in the body.  Carbamazepine (Tegretol) Carbamazepine is changed and broken down by the body. CBD might decrease how quickly the body breaks down carbamazepine. This might increase levels of carbamazepine in the body and increase its side effects.  Citalopram (Celexa) Citalopram is changed and broken down by the body. CBD might decrease how quickly the body breaks down citalopram. This might increase levels of citalopram in the  body and increase its side effects.  Clobazam (Onfi) Clobazam is changed and broken down by the liver. CBD might decrease how quickly the liver breaks down clobazam. This might increase the effects and side effects of clobazam.  Eslicarbazepine (Aptiom) Eslicarbazepine is changed and broken down by the body. CBD might decrease how quickly the body breaks down eslicarbazepine. This might increase levels of eslicarbazepine in the body by a small amount.  Everolimus (Zostress) Everolimus is changed and broken down by the body. CBD might decrease how quickly the body breaks down everolimus. This might increase levels of everolimus in the body.  Lithium Taking higher doses of CBD might increase levels of lithium. This can increase the risk of lithium toxicity.  Medications changed by the liver (Cytochrome P450 1A1 (CYP1A1) substrates) Some medications are changed and broken down by the liver. CBD might change how quickly the liver breaks down these medications. This could change the effects and side effects of these medications.  Medications changed by the liver (Cytochrome P450 1A2 (CYP1A2) substrates) Some medications are changed and broken down by the liver. CBD might change how quickly the liver breaks down these medications. This could change the effects and side effects of these medications.  Medications changed by the liver (Cytochrome P450 1B1 (CYP1B1) substrates) Some medications are changed and broken down by the liver. CBD might change how quickly the liver breaks down these medications. This could change the effects and side effects of these medications.  Medications changed by the liver (Cytochrome P450 2A6 (CYP2A6) substrates) Some medications are changed and broken down by the liver. CBD might change how quickly the liver breaks down these medications. This could change the effects and side effects of these medications.  Medications changed by the liver (Cytochrome P450 2B6  (CYP2B6) substrates) Some medications are changed and broken down by the liver. CBD might change how quickly the liver breaks down these medications. This could change the effects and side effects of these medications.  Medications changed by the liver (Cytochrome P450 2C19 (CYP2C19) substrates) Some medications are changed and broken down by the liver. CBD might change how quickly the liver breaks down these medications. This could change the effects and side effects of these medications.  Medications changed by the  liver (Cytochrome P450 2C8 (CYP2C8) substrates) Some medications are changed and broken down by the liver. CBD might change how quickly the liver breaks down these medications. This could change the effects and side effects of these medications.  Medications changed by the liver (Cytochrome P450 2C9 (CYP2C9) substrates) Some medications are changed and broken down by the liver. CBD might change how quickly the liver breaks down these medications. This could change the effects and side effects of these medications.  Medications changed by the liver (Cytochrome P450 2D6 (CYP2D6) substrates) Some medications are changed and broken down by the liver. CBD might change how quickly the liver breaks down these medications. This could change the effects and side effects of these medications.  Medications changed by the liver (Cytochrome P450 2E1 (CYP2E1) substrates) Some medications are changed and broken down by the liver. CBD might change how quickly the liver breaks down these medications. This could change the effects and side effects of these medications.  Medications changed by the liver (Cytochrome P450 3A4 (CYP3A4) substrates) Some medications are changed and broken down by the liver. CBD might change how quickly the liver breaks down these medications. This could change the effects and side effects of these medications.  Medications changed by the liver (Glucuronidated drugs) Some  medications are changed and broken down by the liver. CBD might change how quickly the liver breaks down these medications. This could change the effects and side effects of these medications.  Medications that decrease the breakdown of other medications by the liver (Cytochrome P450 2C19 (CYP2C19) inhibitors) CBD is changed and broken down by the liver. Some drugs decrease how quickly the liver changes and breaks down CBD. This could change the effects and side effects of CBD.  Medications that decrease the breakdown of other medications in the liver (Cytochrome P450 3A4 (CYP3A4) inhibitors) CBD is changed and broken down by the liver. Some drugs decrease how quickly the liver changes and breaks down CBD. This could change the effects and side effects of CBD.  Medications that increase breakdown of other medications by the liver (Cytochrome P450 3A4 (CYP3A4) inducers) CBD is changed and broken down by the liver. Some drugs increase how quickly the liver changes and breaks down CBD. This could change the effects and side effects of CBD.  Medications that increase the breakdown of other medications by the liver (Cytochrome P450 2C19 (CYP2C19) inducers) CBD is changed and broken down by the liver. Some drugs increase how quickly the liver changes and breaks down CBD. This could change the effects and side effects of CBD.  Methadone (Dolophine) Methadone is broken down by the liver. CBD might decrease how quickly the liver breaks down methadone. Taking cannabidiol along with methadone might increase the effects and side effects of methadone.  Rufinamide (Banzel) Rufinamide is changed and broken down by the body. CBD might decrease how quickly the body breaks down rufinamide. This might increase levels of rufinamide in the body by a small amount.  Sedative medications (CNS depressants) CBD might cause sleepiness and slowed breathing. Some medications, called sedatives, can also cause sleepiness and  slowed breathing. Taking CBD with sedative medications might cause breathing problems and/or too much sleepiness.  Sirolimus (Rapamune) Sirolimus is changed and broken down by the body. CBD might decrease how quickly the body breaks down sirolimus. This might increase levels of sirolimus in the body.  Stiripentol (Diacomit) Stiripentol is changed and broken down by the body. CBD might decrease how quickly the body breaks down  stiripentol. This might increase levels of stiripentol in the body and increase its side effects.  Tacrolimus (Prograf) Tacrolimus is changed and broken down by the body. CBD might decrease how quickly the body breaks down tacrolimus. This might increase levels of tacrolimus in the body.  Tamoxifen (Soltamox) Tamoxifen is changed and broken down by the body. CBD might affect how quickly the body breaks down tamoxifen. This might affect levels of tamoxifen in the body.  Topiramate (Topamax) Topiramate is changed and broken down by the body. CBD might decrease how quickly the body breaks down topiramate. This might increase levels of topiramate in the body by a small amount.  Valproate Valproic acid can cause liver injury. Taking cannabidiol with valproic acid might increase the chance of liver injury. CBD and/or valproic acid might need to be stopped, or the dose might need to be reduced.  Warfarin (Coumadin) CBD might increase levels of warfarin, which can increase the risk for bleeding. CBD and/or warfarin might need to be stopped, or the dose might need to be reduced.  Zonisamide Zonisamide is changed and broken down by the body. CBD might decrease how quickly the body breaks down zonisamide. This might increase levels of zonisamide in the body by a small amount. (Last update: 12/30/2021) ____________________________________________________________________________________________   ____________________________________________________________________________________________  Drug Holidays (Slow)  What is a "Drug Holiday"? Drug Holiday: is the name given to the period of time during which a patient stops taking a medication(s) for the purpose of eliminating tolerance to the drug.  Benefits Improved effectiveness of opioids. Decreased opioid dose needed to achieve benefits. Improved pain with lesser dose.  What is tolerance? Tolerance: is the progressive decreased in effectiveness of a drug due to its repetitive use. With repetitive use, the body gets use to the medication and as a consequence, it loses its effectiveness. This is a common problem seen with opioid pain medications. As a result, a larger dose of the drug is needed to achieve the same effect that used to be obtained with a smaller dose.  How long should a "Drug Holiday" last? You should stay off of the pain medicine for at least 14 consecutive days. (2 weeks)  Should I stop the medicine "cold turkey"? No. You should always coordinate with your Pain Specialist so that he/she can provide you with the correct medication dose to make the transition as smoothly as possible.  How do I stop the medicine? Slowly. You will be instructed to decrease the daily amount of pills that you take by one (1) pill every seven (7) days. This is called a "slow downward taper" of your dose. For example: if you normally take four (4) pills per day, you will be asked to drop this dose to three (3) pills per day for seven (7) days, then to two (2) pills per day for seven (7) days, then to one (1) per day for seven (7) days, and at the end of those last seven (7) days, this is when the "Drug Holiday" would start.   Will I have withdrawals? By doing a "slow downward taper" like this one, it is unlikely that you will experience any significant withdrawal symptoms. Typically, what triggers withdrawals is the sudden stop of a high dose  opioid therapy. Withdrawals can usually be avoided by slowly decreasing the dose over a prolonged period of time. If you do not follow these instructions and decide to stop your medication abruptly, withdrawals may be possible.  What are withdrawals? Withdrawals: refers to   the wide range of symptoms that occur after stopping or dramatically reducing opiate drugs after heavy and prolonged use. Withdrawal symptoms do not occur to patients that use low dose opioids, or those who take the medication sporadically. Contrary to benzodiazepine (example: Valium, Xanax, etc.) or alcohol withdrawals ("Delirium Tremens"), opioid withdrawals are not lethal. Withdrawals are the physical manifestation of the body getting rid of the excess receptors.  Expected Symptoms Early symptoms of withdrawal may include: Agitation Anxiety Muscle aches Increased tearing Insomnia Runny nose Sweating Yawning  Late symptoms of withdrawal may include: Abdominal cramping Diarrhea Dilated pupils Goose bumps Nausea Vomiting  Will I experience withdrawals? Due to the slow nature of the taper, it is very unlikely that you will experience any.  What is a slow taper? Taper: refers to the gradual decrease in dose.  (Last update: 05/21/2020) ____________________________________________________________________________________________    

## 2022-04-22 LAB — TOXASSURE SELECT 13 (MW), URINE

## 2022-04-23 ENCOUNTER — Other Ambulatory Visit: Payer: Self-pay | Admitting: Gastroenterology

## 2022-06-07 ENCOUNTER — Other Ambulatory Visit: Payer: Self-pay | Admitting: Nurse Practitioner

## 2022-06-07 DIAGNOSIS — M62838 Other muscle spasm: Secondary | ICD-10-CM

## 2022-06-07 DIAGNOSIS — G8929 Other chronic pain: Secondary | ICD-10-CM

## 2022-06-08 NOTE — Telephone Encounter (Signed)
Requested Prescriptions  Pending Prescriptions Disp Refills  . DULoxetine (CYMBALTA) 30 MG capsule [Pharmacy Med Name: DULOXETINE HCL DR 30 MG CAP] 180 capsule 0    Sig: TAKE 1 CAPSULE BY MOUTH 2 TIMES DAILY.     Psychiatry: Antidepressants - SNRI - duloxetine Failed - 06/07/2022  2:16 PM      Failed - Last BP in normal range    BP Readings from Last 1 Encounters:  04/19/22 (!) 150/98         Passed - Cr in normal range and within 360 days    Creatinine, Ser  Date Value Ref Range Status  12/25/2021 0.93 0.76 - 1.27 mg/dL Final         Passed - eGFR is 30 or above and within 360 days    GFR calc Af Amer  Date Value Ref Range Status  12/24/2020 84 >59 mL/min/1.73 Final    Comment:    **In accordance with recommendations from the NKF-ASN Task force,**   [facility] is in the process of updating its eGFR calculation to the   2021 CKD-EPI creatinine equation that estimates kidney function   without a race variable.    GFR, Estimated  Date Value Ref Range Status  02/08/2021 >60 >60 mL/min Final    Comment:    (NOTE) Calculated using the CKD-EPI Creatinine Equation (2021)    eGFR  Date Value Ref Range Status  12/25/2021 97 >59 mL/min/1.73 Final         Passed - Completed PHQ-2 or PHQ-9 in the last 360 days      Passed - Valid encounter within last 6 months    Recent Outpatient Visits          5 months ago Annual physical exam   [facility] Jon Billings, NP   8 months ago Upper respiratory tract infection, unspecified type   [facility], Lauren A, NP   11 months ago Aortic atherosclerosis ([place])   [facility] Jon Billings, NP   1 year ago Benign essential hypertension   [place], [place] T, NP   1 year ago Benign essential hypertension   [place], [name], [place]      Future Appointments            In 2 weeks Jon Billings, NP [place], [facility]            . baclofen (LIORESAL) 10 MG tablet [Pharmacy Med Name: BACLOFEN 10 MG TABLET] 90 tablet 0    Sig: TAKE 1 TABLET BY MOUTH EVERYDAY AT BEDTIME     Analgesics:  Muscle Relaxants - baclofen Passed - 06/07/2022  2:16 PM      Passed - Cr in normal range and within 180 days    Creatinine, Ser  Date Value Ref Range Status  12/25/2021 0.93 0.76 - 1.27 mg/dL Final         Passed - eGFR is 30 or above and within 180 days    GFR calc Af Amer  Date Value Ref Range Status  12/24/2020 84 >59 mL/min/1.73 Final    Comment:    **In accordance with recommendations from the NKF-ASN Task force,**   [facility] is in the process of updating its eGFR calculation to the   2021 CKD-EPI creatinine equation that estimates kidney function   without a race variable.    GFR, Estimated  Date Value Ref Range Status  02/08/2021 >60 >60 mL/min Final  Comment:    (NOTE) Calculated using the CKD-EPI Creatinine Equation (2021)    eGFR  Date Value Ref Range Status  12/25/2021 97 >59 mL/min/1.73 Final         Passed - Valid encounter within last 6 months    Recent Outpatient Visits          5 months ago Annual physical exam   [place], NP   8 months ago Upper respiratory tract infection, unspecified type   [facility], Lauren A, NP   11 months ago Aortic atherosclerosis ([place])   [facility] Jon Billings, NP   1 year ago Benign essential hypertension   [place], Henrine Screws T, NP   1 year ago Benign essential hypertension   [facility] [place] [facility], [place]      Future Appointments            In 2 weeks Jon Billings, NP [facility] [place], [place]

## 2022-06-12 ENCOUNTER — Other Ambulatory Visit: Payer: Self-pay | Admitting: Nurse Practitioner

## 2022-06-14 NOTE — Telephone Encounter (Signed)
Requested medication (s) are due for refill today: yes  Requested medication (s) are on the active medication list: yes  Last refill:  12/25/21 #180 with 1 RF  Future visit scheduled: 06/24/22, last seen 12/25/21  Notes to clinic:  This medication can not be delegated, please assess.        Requested Prescriptions  Pending Prescriptions Disp Refills   ondansetron (ZOFRAN) 8 MG tablet [Pharmacy Med Name: ONDANSETRON HCL 8 MG TABLET] 180 tablet 1    Sig: Take 1 tablet (8 mg total) by mouth 2 (two) times daily.     Not Delegated - Gastroenterology: Antiemetics - ondansetron Failed - 06/12/2022  7:20 PM      Failed - This refill cannot be delegated      Passed - AST in normal range and within 360 days    AST  Date Value Ref Range Status  12/25/2021 24 0 - 40 IU/L Final         Passed - ALT in normal range and within 360 days    ALT  Date Value Ref Range Status  12/25/2021 35 0 - 44 IU/L Final         Passed - Valid encounter within last 6 months    Recent Outpatient Visits           5 months ago Annual physical exam   [place], NP   8 months ago Upper respiratory tract infection, unspecified type   [facility], Lauren A, NP   11 months ago Aortic atherosclerosis ([place])   [facility] Jon Billings, NP   1 year ago Benign essential hypertension   [place], [place] T, NP   1 year ago Benign essential hypertension   [place], [name], [place]       Future Appointments             In 1 week Jon Billings, NP [facility], [place]

## 2022-06-23 NOTE — Progress Notes (Unsigned)
There were no vitals taken for this visit.   Subjective:    Patient ID: Jose Washington, male    DOB: [birth year], 56 y.o.   MRN: [id number]  HPI: Jose Washington is a 56 y.o. male  No chief complaint on file.  HYPERTENSION / HYPERLIPIDEMIA Satisfied with current treatment? no Duration of hypertension: years BP monitoring frequency: not checking BP range:  BP medication side effects: no Past BP meds: atenolol Duration of hyperlipidemia: years Cholesterol medication side effects: no Cholesterol supplements: none Past cholesterol medications: atorvastain (lipitor) Medication compliance: excellent compliance Aspirin: no Recent stressors: no Recurrent headaches: no Visual changes: no Palpitations: no Dyspnea: no Chest pain: no Lower extremity edema: no Dizzy/lightheaded: no  DEPRESSION Patient states his mood is well controlled.  Denies concerns at visit today.  States the cymbalta and hydroxyzine are working well for him.  Denies SI. Continues with pain management every 3 months.  Relevant past medical, surgical, family and social history reviewed and updated as indicated. Interim medical history since our last visit reviewed. Allergies and medications reviewed and updated.  Review of Systems  Eyes:  Negative for visual disturbance.  Respiratory:  Negative for chest tightness and shortness of breath.   Cardiovascular:  Negative for chest pain, palpitations and leg swelling.  Neurological:  Negative for dizziness, light-headedness and headaches.  Psychiatric/Behavioral:  Negative for dysphoric mood and suicidal ideas. The patient is not nervous/anxious.     Per HPI unless specifically indicated above     Objective:    There were no vitals taken for this visit.  Wt Readings from Last 3 Encounters:  04/19/22 175 lb (79.4 kg)  03/11/22 180 lb (81.6 kg)  01/25/22 182 lb (82.6 kg)    Physical Exam Vitals and nursing note reviewed.  Constitutional:       General: He is not in acute distress.    Appearance: Normal appearance. He is not ill-appearing, toxic-appearing or diaphoretic.  HENT:     Head: Normocephalic.     Right Ear: External ear normal.     Left Ear: External ear normal.     Nose: Nose normal. No congestion or rhinorrhea.     Mouth/Throat:     Mouth: Mucous membranes are moist.  Eyes:     General:        Right eye: No discharge.        Left eye: No discharge.     Extraocular Movements: Extraocular movements intact.     Conjunctiva/sclera: Conjunctivae normal.     Pupils: Pupils are equal, round, and reactive to light.  Cardiovascular:     Rate and Rhythm: Normal rate and regular rhythm.     Heart sounds: No murmur heard. Pulmonary:     Effort: Pulmonary effort is normal. No respiratory distress.     Breath sounds: Normal breath sounds. No wheezing, rhonchi or rales.  Abdominal:     General: Abdomen is flat. Bowel sounds are normal.  Musculoskeletal:     Cervical back: Normal range of motion and neck supple.  Skin:    General: Skin is warm and dry.     Capillary Refill: Capillary refill takes less than 2 seconds.  Neurological:     General: No focal deficit present.     Mental Status: He is alert and oriented to person, place, and time.  Psychiatric:        Mood and Affect: Mood normal.        Behavior: Behavior normal.  Thought Content: Thought content normal.        Judgment: Judgment normal.     Results for orders placed or performed in visit on 04/19/22  ToxASSURE Select 13 (MW), Urine  Result Value Ref Range   Summary Note       Assessment & Plan:   Problem List Items Addressed This Visit       Cardiovascular and Mediastinum   Benign essential hypertension - Primary   Aortic atherosclerosis ([place])     Other   Major depression in remission ([place])   Anxiety     Follow up plan: No follow-ups on file.

## 2022-06-24 ENCOUNTER — Encounter: Payer: Self-pay | Admitting: Nurse Practitioner

## 2022-06-24 ENCOUNTER — Ambulatory Visit: Payer: No Typology Code available for payment source | Admitting: Nurse Practitioner

## 2022-06-24 VITALS — BP 126/84 | HR 58 | Temp 99.1°F | Wt 176.2 lb

## 2022-06-24 DIAGNOSIS — F419 Anxiety disorder, unspecified: Secondary | ICD-10-CM | POA: Diagnosis not present

## 2022-06-24 DIAGNOSIS — I10 Essential (primary) hypertension: Secondary | ICD-10-CM

## 2022-06-24 DIAGNOSIS — F325 Major depressive disorder, single episode, in full remission: Secondary | ICD-10-CM

## 2022-06-24 DIAGNOSIS — I700 Atherosclerosis of aorta: Secondary | ICD-10-CM

## 2022-06-24 DIAGNOSIS — K219 Gastro-esophageal reflux disease without esophagitis: Secondary | ICD-10-CM

## 2022-06-24 DIAGNOSIS — E785 Hyperlipidemia, unspecified: Secondary | ICD-10-CM

## 2022-06-24 MED ORDER — ATORVASTATIN CALCIUM 40 MG PO TABS: 40.0000 mg | ORAL_TABLET | Freq: Every day | ORAL | 1 refills | Status: DC

## 2022-06-24 MED ORDER — ATENOLOL 50 MG PO TABS
50.0000 mg | ORAL_TABLET | Freq: Every day | ORAL | 1 refills | Status: DC
Start: 2022-06-24 — End: 2022-12-22

## 2022-06-24 MED ORDER — HYDROXYZINE HCL 25 MG PO TABS
25.0000 mg | ORAL_TABLET | Freq: Three times a day (TID) | ORAL | 1 refills | Status: DC | PRN
Start: 2022-06-24 — End: 2023-02-22

## 2022-06-24 MED ORDER — DULOXETINE HCL 30 MG PO CPEP
30.0000 mg | ORAL_CAPSULE | Freq: Two times a day (BID) | ORAL | 1 refills | Status: DC
Start: 2022-06-24 — End: 2023-02-03

## 2022-06-24 MED ORDER — FENOFIBRATE 160 MG PO TABS
160.0000 mg | ORAL_TABLET | Freq: Every day | ORAL | 1 refills | Status: DC
Start: 2022-06-24 — End: 2022-12-22

## 2022-06-24 MED ORDER — OMEPRAZOLE 20 MG PO CPDR
20.0000 mg | DELAYED_RELEASE_CAPSULE | Freq: Every day | ORAL | 1 refills | Status: DC
Start: 2022-06-24 — End: 2022-12-22

## 2022-06-24 MED ORDER — BUSPIRONE HCL 30 MG PO TABS
30.0000 mg | ORAL_TABLET | Freq: Two times a day (BID) | ORAL | 1 refills | Status: DC
Start: 2022-06-24 — End: 2023-02-03

## 2022-06-24 MED ORDER — SUCRALFATE 1 G PO TABS: 1.0000 g | ORAL_TABLET | Freq: Every day | ORAL | 1 refills | Status: DC

## 2022-06-24 NOTE — Assessment & Plan Note (Signed)
Chronic.  Controlled.  Continue with current medication regimen of Atenolol '50mg'$  daily.  Labs ordered today.  Continue to check blood pressures at home.  Follow up if blood pressures are greater than 140/90s.  Return to clinic in 6 months for reevaluation.  Call sooner if concerns arise.

## 2022-06-24 NOTE — Assessment & Plan Note (Signed)
Chronic. Well controlled on Duloxetine '60mg'$  daily.  Refill sent today.  Continue with current medication regimen.  Follow up in 6 months.  Call sooner if concerns arise.

## 2022-06-24 NOTE — Assessment & Plan Note (Signed)
Chronic. Well controlled on Duloxetine '60mg'$ , Buspar and Hydroxyzinedaily.  Refill sent today.  Continue with current medication regimen.  Follow up in 6 months.  Call sooner if concerns arise.

## 2022-06-24 NOTE — Assessment & Plan Note (Signed)
Chronic.  Controlled.  Noted on imaging in 08/14/2019.  Continue with current medication regimen of ASA and atorvastatin.  Labs ordered today.  Return to clinic in 6 months for reevaluation.  Call sooner if concerns arise.

## 2022-06-24 NOTE — Assessment & Plan Note (Signed)
Chronic.  Controlled.  Continue with current medication regimen.  Would like to decrease dose to Omeprazole '20mg'$  daily.  Labs ordered today.  Return to clinic in 6 months for reevaluation.  Call sooner if concerns arise.

## 2022-06-24 NOTE — Assessment & Plan Note (Signed)
Chronic, ongoing.  Continue current medication regimen of Atorvastatin and Fenofibrate daily and adjust as needed.  Lipid panel today.  Refills sent today.

## 2022-06-25 LAB — COMPREHENSIVE METABOLIC PANEL
ALT: 23 IU/L (ref 0–44)
AST: 25 IU/L (ref 0–40)
Albumin/Globulin Ratio: 2 (ref 1.2–2.2)
Albumin: 4.7 g/dL (ref 3.8–4.9)
Alkaline Phosphatase: 58 IU/L (ref 44–121)
BUN/Creatinine Ratio: 11 (ref 9–20)
BUN: 12 mg/dL (ref 6–24)
Bilirubin Total: 0.5 mg/dL (ref 0.0–1.2)
CO2: 22 mmol/L (ref 20–29)
Calcium: 9.8 mg/dL (ref 8.7–10.2)
Chloride: 103 mmol/L (ref 96–106)
Creatinine, Ser: 1.05 mg/dL (ref 0.76–1.27)
Globulin, Total: 2.3 g/dL (ref 1.5–4.5)
Glucose: 94 mg/dL (ref 70–99)
Potassium: 4.3 mmol/L (ref 3.5–5.2)
Sodium: 141 mmol/L (ref 134–144)
Total Protein: 7 g/dL (ref 6.0–8.5)
eGFR: 83 mL/min/{1.73_m2} (ref >59)

## 2022-06-25 LAB — LIPID PANEL
Chol/HDL Ratio: 3.6 ratio (ref 0.0–5.0)
Cholesterol, Total: 147 mg/dL (ref 100–199)
HDL: 41 mg/dL (ref >39)
LDL Chol Calc (NIH): 86 mg/dL (ref 0–99)
Triglycerides: 106 mg/dL (ref 0–149)
VLDL Cholesterol Cal: 20 mg/dL (ref 5–40)

## 2022-06-25 NOTE — Progress Notes (Signed)
Hi Jose Washington. It was nice to see you yesterday.  Your lab work looks good.  No concerns at this time. Continue with your current medication regimen.  Follow up as discussed.  Please let me know if you have any questions.

## 2022-06-27 ENCOUNTER — Other Ambulatory Visit: Payer: Self-pay | Admitting: Nurse Practitioner

## 2022-06-28 NOTE — Telephone Encounter (Signed)
Carafate 1 Gm refused because received at [facility] on 06/24/2022 at 10:13 AM #90, 1 refill    Duplicate request.

## 2022-07-15 NOTE — Progress Notes (Unsigned)
PROVIDER NOTE: Information contained herein reflects review and annotations entered in association with encounter. Interpretation of such information and data should be left to medically-trained personnel. Information provided to patient can be located elsewhere in the medical record under "Patient Instructions". Document created using STT-dictation technology, any transcriptional errors that may result from process are unintentional.    Patient: Jose Washington  Service Category: E/M  Provider: Gaspar Cola, MD  DOB: [birth year]  DOS: 07/19/2022  Referring Provider: Jon Billings, NP  MRN: [id number]  Specialty: Interventional Pain Management  PCP: Jon Billings, NP  Type: Established Patient  Setting: Ambulatory outpatient    Location: Office  Delivery: Face-to-face     HPI  [name], a 56 y.o. year old male, is here today because of his Chronic pain syndrome [G89.4]. [name] primary complain today is No chief complaint on file. Last encounter: My last encounter with him was on 04/19/2022. Pertinent problems: [name] has Chronic neck pain (3ry area of Pain) (Bilateral) (L>R); Chronic pain syndrome; Chronic low back pain (1ry area of Pain) (Bilateral) (L>R) w/o sciatica ; Chronic upper back pain (4th area of Pain) (Bilateral); Chronic shoulder pain (Bilateral) (L>R); Osteoarthritis of AC (acromioclavicular) joint (Right); Chronic sacroiliac joint pain (Bilateral) (L>R); Muscle spasticity; Cervical DDD (C4-5, C5-6, C6-7 and C7-T1); Cervical foraminal stenosis (Bilateral: C5-6 & C6-7, Left: C4-5 & C7-T1); Cervical radiculitis (Bilateral) (L>R); Cervical facet syndrome (Bilateral) (L>R); Cervical spondylosis; Musculoskeletal neck pain (trapezius); Chronic knee pain (Left); Chronic lower extremity pain (2ry area of Pain) (Left); Grade 1 Retrolisthesis of L3 over L4; Lumbar facet arthropathy (Bilateral); Lumbar facet osteoarthritis; Lumbar facet syndrome (Bilateral) (R>L);  Lumbar spondylosis; Chronic shoulder pain (Right); Suprascapular neuropathy (Right); DDD (degenerative disc disease), lumbosacral; Lumbar facet hypertrophy (Multilevel) (Bilateral); Spondylosis without myelopathy or radiculopathy, lumbosacral region; Inflammatory spondylopathy of lumbosacral region [facility]); Chronic lower extremity pain (Right); Chronic musculoskeletal pain; Abnormal MRI, lumbar spine (09/07/2017); Cervicalgia (Bilateral) (L>R); Spondylosis without myelopathy or radiculopathy, cervical region; Myofascial pain syndrome of thoracic spine (serratus muscle) (Right); Chronic flank pain (Right); Abnormal result on screening urine test (03/27/20 & 08/20/20); Chronic hip pain (Bilateral); Chronic hip pain (Right); Enthesopathy of hip region (Right); Osteoarthritis of hip (Right); and Trigger point of shoulder region (Right) on their pertinent problem list. Pain Assessment: Severity of   is reported as a  /10. Location:    / . Onset:  . Quality:  . Timing:  . Modifying factor(s):  Marland Kitchen Vitals:  vitals were not taken for this visit.   Reason for encounter: medication management. ***  RTCB: 10/22/2022 Nonopioids transferred 08/20/2020: Baclofen  Pharmacotherapy Assessment  Analgesic: Hydrocodone/APAP 5/325, 1 tab PO QD (5 mg/day of hydrocodone) MME/day: 5 mg/day.   Monitoring: [place] PMP: PDMP reviewed during this encounter.       Pharmacotherapy: No side-effects or adverse reactions reported. Compliance: No problems identified. Effectiveness: Clinically acceptable.  No notes on file  No results found for: "CBDTHCR" No results found for: "D8THCCBX" No results found for: "D9THCCBX"  UDS:  Summary  Date Value Ref Range Status  04/19/2022 Note  Final    Comment:    ==================================================================== [employer] [facility] (MW) ==================================================================== Test                             Result       Flag        Units  Drug Present and Declared for Prescription Verification   Hydrocodone  520          EXPECTED   ng/mg creat   Dihydrocodeine                 94           EXPECTED   ng/mg creat   Norhydrocodone                 307          EXPECTED   ng/mg creat    Sources of hydrocodone include scheduled prescription medications.    Dihydrocodeine and norhydrocodone are expected metabolites of    hydrocodone. Dihydrocodeine is also available as a scheduled    prescription medication.  ==================================================================== Test                      Result    Flag   Units      Ref Range   Creatinine              301              mg/dL      >=20 ==================================================================== Declared Medications:  The flagging and interpretation on this report are based on the  following declared medications.  Unexpected results may arise from  inaccuracies in the declared medications.   **Note: The testing scope of this panel includes these medications:   Hydrocodone (Norco)   **Note: The testing scope of this panel does not include the  following reported medications:   Acetaminophen (Tylenol)  Acetaminophen (Norco)  Atenolol (Tenormin)  Atorvastatin (Lipitor)  Baclofen (Lioresal)  Buspirone (Buspar)  Calcium  Cyanocobalamin  Desloratadine (Clarinex)  Diphenhydramine  Docusate (Colace)  Duloxetine (Cymbalta)  Fenofibrate  Fluticasone (Flonase)  Glucosamine  Hydroxyzine (Atarax)  Ibuprofen (Advil)  Levalbuterol (Xopenex)  Magnesium (Mag-Ox)  Melatonin  Methylsulfonylmethane  Multivitamin  Omeprazole (Prilosec)  Ondansetron (Zofran)  Potassium  Simethicone  Sucralfate (Carafate)  Supplement  Vitamin C  Vitamin D3  Vitamin E ==================================================================== For clinical consultation, please call [phone]. ====================================================================       ROS  Constitutional: Denies any fever or chills Gastrointestinal: No reported hemesis, hematochezia, vomiting, or acute GI distress Musculoskeletal: Denies any acute onset joint swelling, redness, loss of ROM, or weakness Neurological: No reported episodes of acute onset apraxia, aphasia, dysarthria, agnosia, amnesia, paralysis, loss of coordination, or loss of consciousness  Medication Review  Apoaequorin, Calcium Carbonate, DULoxetine, Elderberry, Glucosamine-Chondroit-Vit C-Mn, HYDROcodone-acetaminophen, Methylsulfonylmethane, Potassium, Vitamin D3, acetaminophen, atenolol, atorvastatin, baclofen, busPIRone, cyanocobalamin, desloratadine-pseudoephedrine, diphenhydrAMINE HCl, docusate sodium, fenofibrate, fluticasone, hydrOXYzine, ibuprofen, levalbuterol, magnesium oxide, melatonin, multivitamin, omeprazole, ondansetron, simethicone, sucralfate, and vitamin E  History Review  Allergy: Jose Washington has No Known Allergies. Drug: Jose Washington  reports no history of drug use. Alcohol:  reports no history of alcohol use. Tobacco:  reports that he has never smoked. He has never used smokeless tobacco. Social: Jose Washington  reports that he has never smoked. He has never used smokeless tobacco. He reports that he does not drink alcohol and does not use drugs. Medical:  has a past medical history of Allergy, Anxiety, Chronic duodenal ulcer with hemorrhage (2012), Chronic neck pain, Depression, GERD (gastroesophageal reflux disease), Hyperlipidemia, Hypertension, and Microscopic hematuria. Surgical: Jose Washington  has a past surgical history that includes eye muscle repair (1972 and 1975); bLEEDING ULCER (2012); Flexible sigmoidoscopy; Colonoscopy with propofol (N/A, 03/24/2021); and Esophagogastroduodenoscopy (egd) with propofol (N/A, 03/24/2021). Family: family history includes Allergies in his brother; Cancer in his  maternal grandfather and mother; Dementia in his father and paternal grandfather; Depression in his father; Diabetes in his mother; Mental illness in his father; Stroke in his paternal grandfather.  Laboratory Chemistry Profile   Renal Lab Results  Component Value Date   BUN 12 06/24/2022   CREATININE 1.05 06/24/2022   BCR 11 06/24/2022   GFRAA 84 12/24/2020   GFRNONAA >60 02/08/2021    Hepatic Lab Results  Component Value Date   AST 25 06/24/2022   ALT 23 06/24/2022   ALBUMIN 4.7 06/24/2022   ALKPHOS 58 06/24/2022   LIPASE 28 02/08/2021    Electrolytes Lab Results  Component Value Date   NA 141 06/24/2022   K 4.3 06/24/2022   CL 103 06/24/2022   CALCIUM 9.8 06/24/2022   MG 2.0 11/12/2016    Bone Lab Results  Component Value Date   VD25OH 35.3 12/25/2021   25OHVITD1 23 (L) 11/12/2016   25OHVITD2 <1.0 11/12/2016   25OHVITD3 23 11/12/2016    Inflammation (CRP: Acute Phase) (ESR: Chronic Phase) Lab Results  Component Value Date   CRP 1.4 (H) 11/12/2016   ESRSEDRATE 3 11/12/2016         Note: Above Lab results reviewed.  Recent Imaging Review  DG PAIN CLINIC C-ARM 1-60 MIN NO REPORT Fluoro was used, but no Radiologist interpretation will be provided.  Please refer to "NOTES" tab for provider progress note. Note: Reviewed        Physical Exam  General appearance: Well nourished, well developed, and well hydrated. In no apparent acute distress Mental status: Alert, oriented x 3 (person, place, & time)       Respiratory: No evidence of acute respiratory distress Eyes: PERLA Vitals: There were no vitals taken for this visit. BMI: Estimated body mass index is 27.6 kg/m as calculated from the following:   Height as of 04/19/22: '5\' 7"'  (1.702 m).   Weight as of 06/24/22: 176 lb 3.2 oz (79.9 kg). Ideal: Patient weight not recorded  Assessment   Diagnosis Status  1. Chronic pain syndrome   2. Chronic low back pain (1ry area of Pain) (Bilateral) (L>R) w/o sciatica     3. Chronic lower extremity pain (2ry area of Pain) (Left)   4. Chronic neck pain (3ry area of Pain) (Bilateral) (L>R)   5. Chronic upper back pain (4th area of Pain) (Bilateral) (L>R)   6. Lumbar facet syndrome (Bilateral) (R>L)   7. Chronic shoulder pain (Bilateral) (L>R)   8. Pharmacologic therapy   9. Chronic use of opiate for therapeutic purpose   10. Opiate use (5 MME/Day)   11. Encounter for medication management   12. Encounter for chronic pain management    Controlled Controlled Controlled   Updated Problems: No problems updated.  Plan of Care  Problem-specific:  No problem-specific Assessment & Plan notes found for this encounter.  Jose Washington has a current medication list which includes the following long-term medication(s): atenolol, atorvastatin, baclofen, calcium carbonate, diphenhydramine hcl, duloxetine, fenofibrate, hydrocodone-acetaminophen, levalbuterol, omeprazole, potassium, simethicone, and sucralfate.  Pharmacotherapy (Medications Ordered): No orders of the defined types were placed in this encounter.  Orders:  No orders of the defined types were placed in this encounter.  Follow-up plan:   No follow-ups on file.     Interventional Therapies  Risk  Complexity Considerations:   Estimated body mass index is 28.35 kg/m as calculated from the following:   Height as of this encounter: '5\' 7"'  (1.702 m).   Weight as of  this encounter: 181 lb (82.1 kg). Abnormal UDS (03/27/2020) & (08/20/20) (+) undisclosed carboxy-THC    Planned  Pending:   Diagnostic/therapeutic right trapezius, and erector spinae muscle TPI    Under consideration:   Therapeutic right IA hip joint injection #2  Therapeutic right lumbar facet RFA #2  Diagnostic right cervical facet block (w/o steroids) #2  Possible therapeutic right cervical facet RFA #1    Completed:   Diagnostic/therapeutic right IA hip inj. x1 (12/09/2020) (100/100/95/90-95)  Therapeutic right lumbar  facet RFA x2 (11/26/2021) (100/100/75/75)  Palliative right lumbar facet MBB x2 (06/07/2019) (100/100/100 x20 days/>75 x3 months)  Diagnostic/therapeutic left lumbar facet MBB x1 (06/07/2019) (100/100/100 x20 days/>75 x3 months)  Diagnostic/therapeutic left L4-5 LESI x2 (07/25/2018) (n/a) Palliative left CESI x3 (02/16/2018) (100/100/100/100)  Diagnostic right cervical facet MBB x1 (03/13/2020) (100/100/100) Diagnostic left cervical facet MBB x1 (01/10/2020) (100/100/100)   Therapeutic  Palliative (PRN) options:   Therapeutic lumbar facet RFA   Therapeutic/palliative lumbar facet MBB   Therapeutic L4-5 LESI   Therapeutic CESI   Therapeutic right IA hip joint injection #2  Diagnostic cervical facet MBB #2      Recent Visits Date Type Provider Dept  04/19/22 Office Visit Milinda Pointer, MD [facility]  Showing recent visits within past 90 days and meeting all other requirements Future Appointments Date Type Provider Dept  07/19/22 Appointment Milinda Pointer, MD [facility]  Showing future appointments within next 90 days and meeting all other requirements  I discussed the assessment and treatment plan with the patient. The patient was provided an opportunity to ask questions and all were answered. The patient agreed with the plan and demonstrated an understanding of the instructions.  Patient advised to call back or seek an in-person evaluation if the symptoms or condition worsens.  Duration of encounter: *** minutes.  Total time on encounter, as per AMA guidelines included both the face-to-face and non-face-to-face time personally spent by the physician and/or other qualified health care professional(s) on the day of the encounter (includes time in activities that require the physician or other qualified health care professional and does not include time in activities normally performed by clinical staff). Physician's time may include the following activities  when performed: preparing to see the patient (eg, review of tests, pre-charting review of records) obtaining and/or reviewing separately obtained history performing a medically appropriate examination and/or evaluation counseling and educating the patient/family/caregiver ordering medications, tests, or procedures referring and communicating with other health care professionals (when not separately reported) documenting clinical information in the electronic or other health record independently interpreting results (not separately reported) and communicating results to the patient/ family/caregiver care coordination (not separately reported)  Note by: Gaspar Cola, MD Date: 07/19/2022; Time: 1:06 PM

## 2022-07-19 ENCOUNTER — Encounter: Payer: Self-pay | Admitting: Pain Medicine

## 2022-07-19 ENCOUNTER — Ambulatory Visit: Payer: No Typology Code available for payment source | Attending: Pain Medicine | Admitting: Pain Medicine

## 2022-07-19 VITALS — BP 129/79 | HR 62 | Temp 97.1°F | Ht 69.0 in | Wt 170.0 lb

## 2022-07-19 DIAGNOSIS — M542 Cervicalgia: Secondary | ICD-10-CM | POA: Diagnosis not present

## 2022-07-19 DIAGNOSIS — M25511 Pain in right shoulder: Secondary | ICD-10-CM | POA: Diagnosis present

## 2022-07-19 DIAGNOSIS — M79605 Pain in left leg: Secondary | ICD-10-CM | POA: Diagnosis not present

## 2022-07-19 DIAGNOSIS — Z79899 Other long term (current) drug therapy: Secondary | ICD-10-CM | POA: Insufficient documentation

## 2022-07-19 DIAGNOSIS — M47816 Spondylosis without myelopathy or radiculopathy, lumbar region: Secondary | ICD-10-CM | POA: Insufficient documentation

## 2022-07-19 DIAGNOSIS — M545 Low back pain, unspecified: Secondary | ICD-10-CM | POA: Diagnosis not present

## 2022-07-19 DIAGNOSIS — M47812 Spondylosis without myelopathy or radiculopathy, cervical region: Secondary | ICD-10-CM | POA: Diagnosis present

## 2022-07-19 DIAGNOSIS — Z79891 Long term (current) use of opiate analgesic: Secondary | ICD-10-CM | POA: Diagnosis present

## 2022-07-19 DIAGNOSIS — M5030 Other cervical disc degeneration, unspecified cervical region: Secondary | ICD-10-CM | POA: Insufficient documentation

## 2022-07-19 DIAGNOSIS — M25512 Pain in left shoulder: Secondary | ICD-10-CM | POA: Insufficient documentation

## 2022-07-19 DIAGNOSIS — G8929 Other chronic pain: Secondary | ICD-10-CM | POA: Insufficient documentation

## 2022-07-19 DIAGNOSIS — G894 Chronic pain syndrome: Secondary | ICD-10-CM | POA: Insufficient documentation

## 2022-07-19 DIAGNOSIS — F1190 Opioid use, unspecified, uncomplicated: Secondary | ICD-10-CM | POA: Insufficient documentation

## 2022-07-19 DIAGNOSIS — M7918 Myalgia, other site: Secondary | ICD-10-CM | POA: Insufficient documentation

## 2022-07-19 DIAGNOSIS — M549 Dorsalgia, unspecified: Secondary | ICD-10-CM | POA: Diagnosis present

## 2022-07-19 MED ORDER — PENTAFLUOROPROP-TETRAFLUOROETH EX AERO: INHALATION_SPRAY | Freq: Once | CUTANEOUS | Status: DC

## 2022-07-19 MED ORDER — HYDROCODONE-ACETAMINOPHEN 5-325 MG PO TABS: 1.0000 | ORAL_TABLET | Freq: Every day | ORAL | 0 refills | Status: DC | PRN

## 2022-07-19 MED ORDER — LIDOCAINE HCL 2 % IJ SOLN
20.0000 mL | Freq: Once | INTRAMUSCULAR | Status: DC
  Filled 2022-07-19: qty 20

## 2022-07-19 MED ORDER — TRIAMCINOLONE ACETONIDE 40 MG/ML IJ SUSP
40.0000 mg | Freq: Once | INTRAMUSCULAR | Status: DC
  Filled 2022-07-19: qty 1

## 2022-07-19 MED ORDER — ROPIVACAINE HCL 2 MG/ML IJ SOLN
9.0000 mL | Freq: Once | INTRAMUSCULAR | Status: DC
  Filled 2022-07-19: qty 20

## 2022-07-19 NOTE — Patient Instructions (Signed)
____________________________________________________________________________________________  Virtual Visits   What is a "Virtual Visit"? It is a healthcare communication encounter (medical visit) that takes place on real time (NOT TEXT or E-MAIL) over the telephone or computer device (desktop, laptop, tablet, smart phone, etc.). It allows for more location flexibility between the patient and the healthcare provider.  Who decides when these types of visits will be used? The physician.  Who is eligible for these types of visits? Only those patients that can be reliably reached over the telephone.  What do you mean by reliably? We do not have time to call everyone multiple times, therefore those that tend to screen calls and then call back later are not suitable candidates for this system. We understand how people are reluctant to pickup on "unknown" calls, therefore, we suggest adding our telephone numbers to your list of "CONTACT(s)". This way, you should be able to readily identify our calls when you receive one. All of our numbers are available below.   Who is not eligible? This option is not available for medication management encounters, specially for controlled substances. Patients on pain medications that fall under the category of controlled substances have to come in for "Face-to-Face" encounters. This is required for mandatory monitoring of these substances. You may be asked to provide a sample for an unannounced urine drug screening test (UDS), and we will need to count your pain pills. Not bringing your pills to be counted may result in no refill. Obviously, neither one of these can be done over the phone.  When will this type of visits be used? You can request a virtual visit whenever you are physically unable to attend a regular appointment. The decision will be made by the physician (or healthcare provider) on a case by case basis.   At what time will I be called? This is an  excellent question. The providers will try to call you whenever they have time available. Do not expect to be called at any specific time. The secretaries will assign you a time for your virtual visit appointment, but this is done simply to keep a list of those patients that need to be called, but not for the purpose of keeping a time schedule. Be advised that the call may come in anytime during the day, between the hours of 8:00 AM and 8::00 PM, depending on provider availability. We do understand that the system is not perfect. If you are unable to be available that day on a moments notice, then request an "in-person" appointment rather than a "virtual visit".  Can I request my medication visits to be "Virtual"? Yes you may request it, but the decision is entirely up to the healthcare provider. Control substances require specific monitoring that requires Face-to-Face encounters. The number of encounters  and the extent of the monitoring is determined on a case by case basis.  Add a new contact to your smart phone and label it "PAIN CLINIC" Under this contact add the following numbers: Main: [phone] (Official Contact Number) Nurses: [phone] (These are outgoing only calling systems. Do not call this number.) Dr. Karmyn Lowman: [phone] or [phone] (Outgoing calls only. Do not call this number.)  ____________________________________________________________________________________________    ____________________________________________________________________________________________  Post-Procedure Discharge Instructions  Instructions: Apply ice:  Purpose: This will minimize any swelling and discomfort after procedure.  When: Day of procedure, as soon as you get home. How: Fill a plastic sandwich bag with crushed ice. Cover it with a small towel and apply   to injection site. How long: (15 min on, 15 min off) Apply for 15 minutes then remove x 15 minutes.  Repeat sequence on  day of procedure, until you go to bed. Apply heat:  Purpose: To treat any soreness and discomfort from the procedure. When: Starting the next day after the procedure. How: Apply heat to procedure site starting the day following the procedure. How long: May continue to repeat daily, until discomfort goes away. Food intake: Start with clear liquids (like water) and advance to regular food, as tolerated.  Physical activities: Keep activities to a minimum for the first 8 hours after the procedure. After that, then as tolerated. Driving: If you have received any sedation, be responsible and do not drive. You are not allowed to drive for 24 hours after having sedation. Blood thinner: (Applies only to those taking blood thinners) You may restart your blood thinner 6 hours after your procedure. Insulin: (Applies only to Diabetic patients taking insulin) As soon as you can eat, you may resume your normal dosing schedule. Infection prevention: Keep procedure site clean and dry. Shower daily and clean area with soap and water. Post-procedure Pain Diary: Extremely important that this be done correctly and accurately. Recorded information will be used to determine the next step in treatment. For the purpose of accuracy, follow these rules: Evaluate only the area treated. Do not report or include pain from an untreated area. For the purpose of this evaluation, ignore all other areas of pain, except for the treated area. After your procedure, avoid taking a long nap and attempting to complete the pain diary after you wake up. Instead, set your alarm clock to go off every hour, on the hour, for the initial 8 hours after the procedure. Document the duration of the numbing medicine, and the relief you are getting from it. Do not go to sleep and attempt to complete it later. It will not be accurate. If you received sedation, it is likely that you were given a medication that may cause amnesia. Because of this,  completing the diary at a later time may cause the information to be inaccurate. This information is needed to plan your care. Follow-up appointment: Keep your post-procedure follow-up evaluation appointment after the procedure (usually 2 weeks for most procedures, 6 weeks for radiofrequencies). DO NOT FORGET to bring you pain diary with you.   Expect: (What should I expect to see with my procedure?) From numbing medicine (AKA: Local Anesthetics): Numbness or decrease in pain. You may also experience some weakness, which if present, could last for the duration of the local anesthetic. Onset: Full effect within 15 minutes of injected. Duration: It will depend on the type of local anesthetic used. On the average, 1 to 8 hours.  From steroids (Applies only if steroids were used): Decrease in swelling or inflammation. Once inflammation is improved, relief of the pain will follow. Onset of benefits: Depends on the amount of swelling present. The more swelling, the longer it will take for the benefits to be seen. In some cases, up to 10 days. Duration: Steroids will stay in the system x 2 weeks. Duration of benefits will depend on multiple posibilities including persistent irritating factors. Side-effects: If present, they may typically last 2 weeks (the duration of the steroids). Frequent: Cramps (if they occur, drink Gatorade and take over-the-counter Magnesium 450-500 mg once to twice a day); water retention with temporary weight gain; increases in blood sugar; decreased immune system response; increased appetite. Occasional: Facial flushing (  red, warm cheeks); mood swings; menstrual changes. Uncommon: Long-term decrease or suppression of natural hormones; bone thinning. (These are more common with higher doses or more frequent use. This is why we prefer that our patients avoid having any injection therapies in other practices.)  Very Rare: Severe mood changes; psychosis; aseptic necrosis. From  procedure: Some discomfort is to be expected once the numbing medicine wears off. This should be minimal if ice and heat are applied as instructed.  Call if: (When should I call?) You experience numbness and weakness that gets worse with time, as opposed to wearing off. New onset bowel or bladder incontinence. (Applies only to procedures done in the spine)  Emergency Numbers: Durning business hours (Monday - Thursday, 8:00 AM - 4:00 PM) (Friday, 9:00 AM - 12:00 Noon): [phone] After hours: [phone] NOTE: If you are having a problem and are unable connect with, or to talk to a provider, then go to your nearest urgent care or emergency department. If the problem is serious and urgent, please call 911. ____________________________________________________________________________________________    

## 2022-07-19 NOTE — Progress Notes (Signed)
PROVIDER NOTE: Interpretation of information contained herein should be left to medically-trained personnel. Specific patient instructions are provided elsewhere under "Patient Instructions" section of medical record. This document was created in part using STT-dictation technology, any transcriptional errors that may result from this process are unintentional.  Patient: Jose Washington Type: Established DOB: 1966-08-23 MRN: [id number] PCP: Jon Billings, NP  Service: Procedure DOS: 07/19/2022 Setting: Ambulatory Location: Ambulatory outpatient facility Delivery: Face-to-face Provider: Gaspar Cola, MD Specialty: Interventional Pain Management Specialty designation: 09 Location: Outpatient facility Ref. Prov.: Jon Billings, NP    Primary Reason for Visit: Interventional Pain Management Treatment. CC: Neck Pain (Right side)    Procedure:          Anesthesia, Analgesia, Anxiolysis:  Type: Right upper shoulder Trigger Point Injection (1-2 muscle groups) #2  CPT: 20552 Primary Purpose: Therapeutic Region: Posterior  shoulder and upper back region Level: Cervico-thoracic Target Area: Trapezius, levator scapula, supraspinatus, longissimus thoracis Trigger Point Approach: Percutaneous, ipsilateral approach. Laterality: Right-Sided        Type: Local Anesthesia Local Anesthetic: Lidocaine 1-2% Sedation: None  Indication(s):  Analgesia Route: Infiltration ([place]/IM) IV Access: N/A   Position: Sitting   1. Trigger point of shoulder region (Right)   2. Myofascial pain syndrome of thoracic spine (serratus muscle) (Right)   3. Musculoskeletal neck pain (trapezius)   4. Chronic upper back pain (4th area of Pain) (Bilateral) (L>R)   5. Chronic shoulder pain (Bilateral) (L>R)    NAS-11 Pain score:   Pre-procedure: 4 /10   Post-procedure: 4 /10     Pre-op H&P Assessment:  Jose Washington is a 56 y.o. (year old), male patient, seen today for interventional treatment. He  has a  past surgical history that includes eye muscle repair (1972 and 1975); bLEEDING ULCER (2012); Flexible sigmoidoscopy; Colonoscopy with propofol (N/A, 03/24/2021); and Esophagogastroduodenoscopy (egd) with propofol (N/A, 03/24/2021). Jose Washington has a current medication list which includes the following prescription(s): acetaminophen, prevagen extra strength, atenolol, atorvastatin, baclofen, buspirone, calcium carbonate, vitamin d3, desloratadine-pseudoephedrine, diphenhydramine hcl, docusate sodium, duloxetine, elderberry, fenofibrate, fluticasone, glucosamine-chondroit-vit c-mn, [START ON 07/24/2022] hydrocodone-acetaminophen, [START ON 08/23/2022] hydrocodone-acetaminophen, [START ON 09/22/2022] hydrocodone-acetaminophen, hydroxyzine, ibuprofen, levalbuterol, magnesium oxide, melatonin, methylsulfonylmethane, multivitamin, omeprazole, ondansetron, potassium, simethicone, sucralfate, cyanocobalamin, and vitamin e, and the following Facility-Administered Medications: lidocaine, pentafluoroprop-tetrafluoroeth, ropivacaine (pf) 2 mg/ml (0.2%), and triamcinolone acetonide. His primarily concern today is the Neck Pain (Right side)  Initial Vital Signs:  Pulse/HCG Rate: 62  Temp: (!) 97.1 F (36.2 C) Resp:   BP: 129/79 SpO2: 100 %  BMI: Estimated body mass index is 25.1 kg/m as calculated from the following:   Height as of this encounter: '5\' 9"'$  (1.753 m).   Weight as of this encounter: 170 lb (77.1 kg).  Risk Assessment: Allergies: Reviewed. He has No Known Allergies.  Allergy Precautions: None required Coagulopathies: Reviewed. None identified.  Blood-thinner therapy: None at this time Active Infection(s): Reviewed. None identified. Jose Washington is afebrile  Site Confirmation: Jose Washington was asked to confirm the procedure and laterality before marking the site Procedure checklist: Completed Consent: Before the procedure and under the influence of no sedative(s), amnesic(s), or anxiolytics, the  patient was informed of the treatment options, risks and possible complications. To fulfill our ethical and legal obligations, as recommended by the American Medical Association's Code of Ethics, I have informed the patient of my clinical impression; the nature and purpose of the treatment or procedure; the risks, benefits, and possible complications of the intervention; the alternatives, including doing nothing;  the risk(s) and benefit(s) of the alternative treatment(s) or procedure(s); and the risk(s) and benefit(s) of doing nothing. The patient was provided information about the general risks and possible complications associated with the procedure. These may include, but are not limited to: failure to achieve desired goals, infection, bleeding, organ or nerve damage, allergic reactions, paralysis, and death. In addition, the patient was informed of those risks and complications associated to the procedure, such as failure to decrease pain; infection; bleeding; organ or nerve damage with subsequent damage to sensory, motor, and/or autonomic systems, resulting in permanent pain, numbness, and/or weakness of one or several areas of the body; allergic reactions; (i.e.: anaphylactic reaction); and/or death. Furthermore, the patient was informed of those risks and complications associated with the medications. These include, but are not limited to: allergic reactions (i.e.: anaphylactic or anaphylactoid reaction(s)); adrenal axis suppression; blood sugar elevation that in diabetics may result in ketoacidosis or comma; water retention that in patients with history of congestive heart failure may result in shortness of breath, pulmonary edema, and decompensation with resultant heart failure; weight gain; swelling or edema; medication-induced neural toxicity; particulate matter embolism and blood vessel occlusion with resultant organ, and/or nervous system infarction; and/or aseptic necrosis of one or more  joints. Finally, the patient was informed that Medicine is not an exact science; therefore, there is also the possibility of unforeseen or unpredictable risks and/or possible complications that may result in a catastrophic outcome. The patient indicated having understood very clearly. We have given the patient no guarantees and we have made no promises. Enough time was given to the patient to ask questions, all of which were answered to the patient's satisfaction. Mr. Eskelson has indicated that he wanted to continue with the procedure. Attestation: I, the ordering provider, attest that I have discussed with the patient the benefits, risks, side-effects, alternatives, likelihood of achieving goals, and potential problems during recovery for the procedure that I have provided informed consent. Date  Time: 07/19/2022 11:56 AM  Pre-Procedure Preparation:  Monitoring: As per clinic protocol. Respiration, ETCO2, SpO2, BP, heart rate and rhythm monitor placed and checked for adequate function Safety Precautions: Patient was assessed for positional comfort and pressure points before starting the procedure. Time-out: I initiated and conducted the "Time-out" before starting the procedure, as per protocol. The patient was asked to participate by confirming the accuracy of the "Time Out" information. Verification of the correct person, site, and procedure were performed and confirmed by me, the nursing staff, and the patient. "Time-out" conducted as per Joint Commission's Universal Protocol (UP.01.01.01). Time:    Description of Procedure:          Area Prepped: Entire             Region DuraPrep (Iodine Povacrylex [0.7% available iodine] and Isopropyl Alcohol, 74% w/w) Safety Precautions: Aspiration looking for blood return was conducted prior to all injections. At no point did we inject any substances, as a needle was being advanced. No attempts were made at seeking any paresthesias. Safe injection practices and  needle disposal techniques used. Medications properly checked for expiration dates. SDV (single dose vial) medications used. Description of the Procedure: Protocol guidelines were followed. The patient was placed in position over the fluoroscopy table. The target area was identified and the area prepped in the usual manner. Skin & deeper tissues infiltrated with local anesthetic. Appropriate amount of time allowed to pass for local anesthetics to take effect. The procedure needles were then advanced to the target area. Proper  needle placement secured. Negative aspiration confirmed. Solution injected in intermittent fashion, asking for systemic symptoms every 0.5cc of injectate. The needles were then removed and the area cleansed, making sure to leave some of the prepping solution back to take advantage of its long term bactericidal properties.  Vitals:   07/19/22 1153  BP: 129/79  Pulse: 62  Temp: (!) 97.1 F (36.2 C)  SpO2: 100%  Weight: 170 lb (77.1 kg)  Height: '5\' 9"'$  (1.753 m)    Start Time:   hrs. End Time:   hrs. Materials:  Needle(s) Type: Epidural needle Gauge: 20G Length: 3.5-in Medication(s): Please see orders for medications and dosing details.  Imaging Guidance:          Type of Imaging Technique: None used Indication(s): N/A Exposure Time: No patient exposure Contrast: None used. Fluoroscopic Guidance: N/A Ultrasound Guidance: N/A Interpretation: N/A  Antibiotic Prophylaxis:   Anti-infectives (From admission, onward)    None      Indication(s): None identified  Post-operative Assessment:  Post-procedure Vital Signs:  Pulse/HCG Rate: 62  Temp:  (!) 97.1 F (36.2 C) Resp:   BP: 129/79 SpO2: 100 %  EBL: None  Complications: No immediate post-treatment complications observed by team, or reported by patient.  Note: The patient tolerated the entire procedure well. A repeat set of vitals were taken after the procedure and the patient was kept under  observation following institutional policy, for this type of procedure. Post-procedural neurological assessment was performed, showing return to baseline, prior to discharge. The patient was provided with post-procedure discharge instructions, including a section on how to identify potential problems. Should any problems arise concerning this procedure, the patient was given instructions to immediately contact us, at any time, without hesitation. In any case, we plan to contact the patient by telephone for a follow-up status report regarding this interventional procedure.  Comments:  No additional relevant information.  Plan of Care  Orders:  Orders Placed This Encounter  Procedures   TRIGGER POINT INJECTION    Scheduling Instructions:     Area: Upper Back     Side: Right     Sedation: No Sedation.     Timeframe: Today    Order Specific Question:   Where will this procedure be performed?    Answer:   [facility]   Informed Consent Details: Physician/Practitioner Attestation; Transcribe to consent form and obtain patient signature    Provider Attestation: I, Empire Dossie Arbour, MD, (Pain Management Specialist), the physician/practitioner, attest that I have discussed with the patient the benefits, risks, side effects, alternatives, likelihood of achieving goals and potential problems during recovery for the procedure that I have provided informed consent.    Scheduling Instructions:     Note: Always confirm laterality of pain with Mr. Kemppainen, before procedure.     Transcribe to consent form and obtain patient signature.    Order Specific Question:   Physician/Practitioner attestation of informed consent for procedure/surgical case    Answer:   I, the physician/practitioner, attest that I have discussed with the patient the benefits, risks, side effects, alternatives, likelihood of achieving goals and potential problems during recovery for the procedure that I have provided informed  consent.    Order Specific Question:   Procedure    Answer:   Myoneural Block (Trigger Point injection)    Order Specific Question:   Physician/Practitioner performing the procedure    Answer:   Kessa Fairbairn A. Dossie Arbour MD    Order Specific Question:   Indication/Reason  Answer:   Musculoskeletal pain/myofascial pain secondary to trigger point   Provide equipment / supplies at bedside    "Block Tray" (Disposable  single use) Needle type: SpinalRegular Amount/quantity: 1 Size: Short(1.5-inch) Gauge: 25G    Standing Status:   Standing    Number of Occurrences:   1    Order Specific Question:   Specify    Answer:   Block Tray   Chronic Opioid Analgesic:  Hydrocodone/APAP 5/325, 1 tab PO QD (5 mg/day of hydrocodone) MME/day: 5 mg/day.   Medications ordered for procedure: Meds ordered this encounter  Medications   HYDROcodone-acetaminophen (NORCO/VICODIN) 5-325 MG tablet    Sig: Take 1 tablet by mouth daily as needed for severe pain. Must last 30 days.    Dispense:  30 tablet    Refill:  0    DO NOT: delete (not duplicate); no partial-fill (will deny script to complete), no refill request (F/U required). DISPENSE: 1 day early if closed on fill date. WARN: No CNS-depressants within 8 hrs of med.   HYDROcodone-acetaminophen (NORCO/VICODIN) 5-325 MG tablet    Sig: Take 1 tablet by mouth daily as needed for severe pain. Must last 30 days.    Dispense:  30 tablet    Refill:  0    DO NOT: delete (not duplicate); no partial-fill (will deny script to complete), no refill request (F/U required). DISPENSE: 1 day early if closed on fill date. WARN: No CNS-depressants within 8 hrs of med.   HYDROcodone-acetaminophen (NORCO/VICODIN) 5-325 MG tablet    Sig: Take 1 tablet by mouth daily as needed for severe pain. Must last 30 days.    Dispense:  30 tablet    Refill:  0    DO NOT: delete (not duplicate); no partial-fill (will deny script to complete), no refill request (F/U required). DISPENSE: 1 day  early if closed on fill date. WARN: No CNS-depressants within 8 hrs of med.   lidocaine (XYLOCAINE) 2 % (with pres) injection 400 mg   pentafluoroprop-tetrafluoroeth (GEBAUERS) aerosol   ropivacaine (PF) 2 mg/mL (0.2%) (NAROPIN) injection 9 mL   triamcinolone acetonide (KENALOG-40) injection 40 mg   Medications administered: Sallee Lange. Poupard had no medications administered during this visit.  See the medical record for exact dosing, route, and time of administration.  Follow-up plan:   Return in 2 weeks (on 08/02/2022) for Proc-day (T,Th), (VV), (PPE).       Interventional Therapies  Risk  Complexity Considerations:   Estimated body mass index is 28.35 kg/m as calculated from the following:   Height as of this encounter: '5\' 7"'$  (1.702 m).   Weight as of this encounter: 181 lb (82.1 kg). Abnormal UDS (03/27/2020) & (08/20/20) (+) undisclosed carboxy-THC    Planned  Pending:   Diagnostic/therapeutic right trapezius, and erector spinae muscle TPI    Under consideration:   Therapeutic right IA hip joint injection #2  Therapeutic right lumbar facet RFA #2  Diagnostic right cervical facet block (w/o steroids) #2  Possible therapeutic right cervical facet RFA #1    Completed:   Diagnostic/therapeutic right IA hip inj. x1 (12/09/2020) (100/100/95/90-95)  Therapeutic right lumbar facet RFA x2 (11/26/2021) (100/100/75/75)  Palliative right lumbar facet MBB x2 (06/07/2019) (100/100/100 x20 days/>75 x3 months)  Diagnostic/therapeutic left lumbar facet MBB x1 (06/07/2019) (100/100/100 x20 days/>75 x3 months)  Diagnostic/therapeutic left L4-5 LESI x2 (07/25/2018) (n/a) Palliative left CESI x3 (02/16/2018) (100/100/100/100)  Diagnostic right cervical facet MBB x1 (03/13/2020) (100/100/100) Diagnostic left cervical facet MBB x1 (01/10/2020) (100/100/100)  Therapeutic  Palliative (PRN) options:   Therapeutic lumbar facet RFA   Therapeutic/palliative lumbar facet MBB   Therapeutic L4-5 LESI    Therapeutic CESI   Therapeutic right IA hip joint injection #2  Diagnostic cervical facet MBB #2      Recent Visits No visits were found meeting these conditions. Showing recent visits within past 90 days and meeting all other requirements Today's Visits Date Type Provider Dept  07/19/22 Office Visit Milinda Pointer, MD [facility]  Showing today's visits and meeting all other requirements Future Appointments No visits were found meeting these conditions. Showing future appointments within next 90 days and meeting all other requirements  Disposition: Discharge home  Discharge (Date  Time): 07/19/2022;   hrs.   Primary Care Physician: Jon Billings, NP Location: [facility] Note by: Gaspar Cola, MD Date: 07/19/2022; Time: 12:22 PM  Disclaimer:  Medicine is not an Chief Strategy Officer. The only guarantee in medicine is that nothing is guaranteed. It is important to note that the decision to proceed with this intervention was based on the information collected from the patient. The Data and conclusions were drawn from the patient's questionnaire, the interview, and the physical examination. Because the information was provided in large part by the patient, it cannot be guaranteed that it has not been purposely or unconsciously manipulated. Every effort has been made to obtain as much relevant data as possible for this evaluation. It is important to note that the conclusions that lead to this procedure are derived in large part from the available data. Always take into account that the treatment will also be dependent on availability of resources and existing treatment guidelines, considered by other Pain Management Practitioners as being common knowledge and practice, at the time of the intervention. For Medico-Legal purposes, it is also important to point out that variation in procedural techniques and pharmacological choices are the acceptable  norm. The indications, contraindications, technique, and results of the above procedure should only be interpreted and judged by a Board-Certified Interventional Pain Specialist with extensive familiarity and expertise in the same exact procedure and technique.

## 2022-07-19 NOTE — Progress Notes (Signed)
Nursing Pain Medication Assessment:  Safety precautions to be maintained throughout the outpatient stay will include: orient to surroundings, keep bed in low position, maintain call bell within reach at all times, provide assistance with transfer out of bed and ambulation.  Medication Inspection Compliance: Pill count conducted under aseptic conditions, in front of the patient. Neither the pills nor the bottle was removed from the patient's sight at any time. Once count was completed pills were immediately returned to the patient in their original bottle.  Medication: Hydrocodone/APAP Pill/Patch Count: 23 out of 30 Pill/Patch Appearance: Markings consistent with prescribed medication Bottle Appearance: Standard pharmacy container. Clearly labeled. Filled Date: 45 / 49 / 2023 Last Medication intake:  TodaySafety precautions to be maintained throughout the outpatient stay will include: orient to surroundings, keep bed in low position, maintain call bell within reach at all times, provide assistance with transfer out of bed and ambulation.

## 2022-08-03 ENCOUNTER — Other Ambulatory Visit: Payer: Self-pay | Admitting: Gastroenterology

## 2022-08-03 ENCOUNTER — Ambulatory Visit: Payer: No Typology Code available for payment source | Attending: Pain Medicine | Admitting: Pain Medicine

## 2022-08-03 DIAGNOSIS — M542 Cervicalgia: Secondary | ICD-10-CM

## 2022-08-03 DIAGNOSIS — M25511 Pain in right shoulder: Secondary | ICD-10-CM | POA: Diagnosis not present

## 2022-08-03 DIAGNOSIS — M47812 Spondylosis without myelopathy or radiculopathy, cervical region: Secondary | ICD-10-CM

## 2022-08-03 DIAGNOSIS — M5030 Other cervical disc degeneration, unspecified cervical region: Secondary | ICD-10-CM

## 2022-08-03 DIAGNOSIS — G8929 Other chronic pain: Secondary | ICD-10-CM

## 2022-08-03 NOTE — Patient Instructions (Signed)
______________________________________________________________________  Preparing for Procedure with Sedation  NOTICE: Due to recent regulatory changes, starting on June 01, 2021, procedures requiring intravenous (IV) sedation will no longer be performed at the [facility].  These types of procedures are required to be performed at [facility] ambulatory surgery facility.  We are very sorry for the inconvenience.  Procedure appointments are limited to planned procedures: No Prescription Refills. No disability issues will be discussed. No medication changes will be discussed.  Instructions: Oral Intake: Do not eat or drink anything for at least 8 hours prior to your procedure. (Exception: Blood Pressure Medication. See below.) Transportation: A driver is required. You may not drive yourself after the procedure. Blood Pressure Medicine: Do not forget to take your blood pressure medicine with a sip of water the morning of the procedure. If your Diastolic (lower reading) is above 100 mmHg, elective cases will be cancelled/rescheduled. Blood thinners: These will need to be stopped for procedures. Notify our staff if you are taking any blood thinners. Depending on which one you take, there will be specific instructions on how and when to stop it. Diabetics on insulin: Notify the staff so that you can be scheduled 1st case in the morning. If your diabetes requires high dose insulin, take only  of your normal insulin dose the morning of the procedure and notify the staff that you have done so. Preventing infections: Shower with an antibacterial soap the morning of your procedure. Build-up your immune system: Take 1000 mg of Vitamin C with every meal (3 times a day) the day prior to your procedure. Antibiotics: Inform the staff if you have a condition or reason that requires you to take antibiotics before dental procedures. Pregnancy: If you are pregnant, call and cancel the procedure. Sickness: If  you have a cold, fever, or any active infections, call and cancel the procedure. Arrival: You must be in the facility at least 30 minutes prior to your scheduled procedure. Children: Do not bring children with you. Dress appropriately: There is always the possibility that your clothing may get soiled. Valuables: Do not bring any jewelry or valuables.  Reasons to call and reschedule or cancel your procedure: (Following these recommendations will minimize the risk of a serious complication.) Surgeries: Avoid having procedures within 2 weeks of any surgery. (Avoid for 2 weeks before or after any surgery). Flu Shots: Avoid having procedures within 2 weeks of a flu shots. (Avoid for 2 weeks before or after immunizations). Barium: Avoid having a procedure within 7-10 days after having had a radiological study involving the use of radiological contrast. (Myelograms, Barium swallow or enema study). Heart attacks: Avoid any elective procedures or surgeries for the initial 6 months after a "Myocardial Infarction" (Heart Attack). Blood thinners: It is imperative that you stop these medications before procedures. Let us know if you if you take any blood thinner.  Infection: Avoid procedures during or within two weeks of an infection (including chest colds or gastrointestinal problems). Symptoms associated with infections include: Localized redness, fever, chills, night sweats or profuse sweating, burning sensation when voiding, cough, congestion, stuffiness, runny nose, sore throat, diarrhea, nausea, vomiting, cold or Flu symptoms, recent or current infections. It is specially important if the infection is over the area that we intend to treat. Heart and lung problems: Symptoms that may suggest an active cardiopulmonary problem include: cough, chest pain, breathing difficulties or shortness of breath, dizziness, ankle swelling, uncontrolled high or unusually low blood pressure, and/or palpitations. If you are    experiencing any of these symptoms, cancel your procedure and contact your primary care physician for an evaluation.  Remember:  Regular Business hours are:  Monday to Thursday 8:00 AM to 4:00 PM  Provider's Schedule: Kol Consuegra, MD:  Procedure days: Tuesday and Thursday 7:30 AM to 4:00 PM  Bilal Lateef, MD:  Procedure days: Monday and Wednesday 7:30 AM to 4:00 PM ______________________________________________________________________  ____________________________________________________________________________________________  General Risks and Possible Complications  Patient Responsibilities: It is important that you read this as it is part of your informed consent. It is our duty to inform you of the risks and possible complications associated with treatments offered to you. It is your responsibility as a patient to read this and to ask questions about anything that is not clear or that you believe was not covered in this document.  Patient's Rights: You have the right to refuse treatment. You also have the right to change your mind, even after initially having agreed to have the treatment done. However, under this last option, if you wait until the last second to change your mind, you may be charged for the materials used up to that point.  Introduction: Medicine is not an exact science. Everything in Medicine, including the lack of treatment(s), carries the potential for danger, harm, or loss (which is by definition: Risk). In Medicine, a complication is a secondary problem, condition, or disease that can aggravate an already existing one. All treatments carry the risk of possible complications. The fact that a side effects or complications occurs, does not imply that the treatment was conducted incorrectly. It must be clearly understood that these can happen even when everything is done following the highest safety standards.  No treatment: You can choose not to proceed with the  proposed treatment alternative. The "PRO(s)" would include: avoiding the risk of complications associated with the therapy. The "CON(s)" would include: not getting any of the treatment benefits. These benefits fall under one of three categories: diagnostic; therapeutic; and/or palliative. Diagnostic benefits include: getting information which can ultimately lead to improvement of the disease or symptom(s). Therapeutic benefits are those associated with the successful treatment of the disease. Finally, palliative benefits are those related to the decrease of the primary symptoms, without necessarily curing the condition (example: decreasing the pain from a flare-up of a chronic condition, such as incurable terminal cancer).  General Risks and Complications: These are associated to most interventional treatments. They can occur alone, or in combination. They fall under one of the following six (6) categories: no benefit or worsening of symptoms; bleeding; infection; nerve damage; allergic reactions; and/or death. No benefits or worsening of symptoms: In Medicine there are no guarantees, only probabilities. No healthcare provider can ever guarantee that a medical treatment will work, they can only state the probability that it may. Furthermore, there is always the possibility that the condition may worsen, either directly, or indirectly, as a consequence of the treatment. Bleeding: This is more common if the patient is taking a blood thinner, either prescription or over the counter (example: Goody Powders, Fish oil, Aspirin, Garlic, etc.), or if suffering a condition associated with impaired coagulation (example: Hemophilia, cirrhosis of the liver, low platelet counts, etc.). However, even if you do not have one on these, it can still happen. If you have any of these conditions, or take one of these drugs, make sure to notify your treating physician. Infection: This is more common in patients with a compromised  immune system, either due to disease (example:   diabetes, cancer, human immunodeficiency virus [HIV], etc.), or due to medications or treatments (example: therapies used to treat cancer and rheumatological diseases). However, even if you do not have one on these, it can still happen. If you have any of these conditions, or take one of these drugs, make sure to notify your treating physician. Nerve Damage: This is more common when the treatment is an invasive one, but it can also happen with the use of medications, such as those used in the treatment of cancer. The damage can occur to small secondary nerves, or to large primary ones, such as those in the spinal cord and brain. This damage may be temporary or permanent and it may lead to impairments that can range from temporary numbness to permanent paralysis and/or brain death. Allergic Reactions: Any time a substance or material comes in contact with our body, there is the possibility of an allergic reaction. These can range from a mild skin rash (contact dermatitis) to a severe systemic reaction (anaphylactic reaction), which can result in death. Death: In general, any medical intervention can result in death, most of the time due to an unforeseen complication. ____________________________________________________________________________________________  

## 2022-08-03 NOTE — Progress Notes (Signed)
Patient: Jose Washington  Service Category: E/M  Provider: Gaspar Cola, MD  DOB: December 25, 1965  DOS: 08/03/2022  Location: Office  MRN: [id number]  Setting: Ambulatory outpatient  Referring Provider: Jon Billings, NP  Type: Established Patient  Specialty: Interventional Pain Management  PCP: Jon Billings, NP  Location: Remote location  Delivery: TeleHealth     Virtual Encounter - Pain Management PROVIDER NOTE: Information contained herein reflects review and annotations entered in association with encounter. Interpretation of such information and data should be left to medically-trained personnel. Information provided to patient can be located elsewhere in the medical record under "Patient Instructions". Document created using STT-dictation technology, any transcriptional errors that may result from process are unintentional.    Contact & Pharmacy Preferred: [phone] Home: [phone] (home) Mobile: [phone] (mobile) E-mail: [email or web address]  [facility] [id number], [place] [address] Phone: [phone] Fax: [phone]  [id number] [place] [id number]: [phone]: [phone]  Pre-screening  Mr. SPoweoffered "in-person" vs "virtual" encounter. He indicated preferring virtual for this encounter.   Reason COVID-19*  Social distancing based on CDC and AMA recommendations.   I contacted Jose Roeson 08/03/2022 via telephone.      I clearly identified myself as FGaspar Cola MD. I verified that I was speaking with the correct person using two identifiers (Name: Jose Washington and date of birth: 56Mar 15, 1967.  Consent I sought verbal advanced consent from Jose Roesfor virtual visit interactions. I informed Mr. SFandrichof possible security and privacy concerns, risks, and limitations  associated with providing "not-in-person" medical evaluation and management services. I also informed Jose Washington the availability of "in-person" appointments. Finally, I informed him that there would be a charge for the virtual visit and that he could be  personally, fully or partially, financially responsible for it. Jose Washington understanding and agreed to proceed.   Historic Elements   Mr. CKAYA KLAUSINGis a 56y.o. year old, male patient evaluated today after our last contact on 07/19/2022. Jose Washington has a past medical history of Allergy, Anxiety, Chronic duodenal ulcer with hemorrhage (2012), Chronic neck pain, Depression, GERD (gastroesophageal reflux disease), Hyperlipidemia, Hypertension, and Microscopic hematuria. He also  has a past surgical history that includes eye muscle repair (1972 and 1975); bLEEDING ULCER (2012); Flexible sigmoidoscopy; Colonoscopy with propofol (N/A, 03/24/2021); and Esophagogastroduodenoscopy (egd) with propofol (N/A, 03/24/2021). Jose Washington a current medication list which includes the following prescription(s): acetaminophen, prevagen extra strength, atenolol, atorvastatin, baclofen, buspirone, calcium carbonate, vitamin d3, desloratadine-pseudoephedrine, diphenhydramine hcl, docusate sodium, duloxetine, elderberry, fenofibrate, fluticasone, glucosamine-chondroit-vit c-mn, hydrocodone-acetaminophen, [START ON 08/23/2022] hydrocodone-acetaminophen, [START ON 09/22/2022] hydrocodone-acetaminophen, hydroxyzine, ibuprofen, levalbuterol, magnesium oxide, melatonin, methylsulfonylmethane, multivitamin, omeprazole, ondansetron, potassium, simethicone, sucralfate, cyanocobalamin, and vitamin e. He  reports that he has never smoked. He has never used smokeless tobacco. He reports that he does not drink alcohol and does not use drugs. Mr. SCuppleshas No Known Allergies.   HPI  Today, he is being contacted for a post-procedure assessment.  According to the  patient he attained a 100% relief of the pain for the duration of the local anesthetic followed by an ongoing 100% relief of the pain in the area of the trapezius and levator scapular muscle.  He is extremely happy with the results of the trigger point, but he refers that he  still having the right-sided neck pain which we know is coming from the facet joints.  We have previously done 1 diagnostic cervical facet block with normal local anesthetic which did provide him with 100% relief of the pain, which obviously did not last since we were not allowed to put steroids, according to his insurance company.  Today he has requested that we schedule him for the second diagnostic block with the idea of doing a radiofrequency ablation should he again get similar results to those that he obtained during the first diagnostic cervical medial branch block.  Post-procedure evaluation    Procedure:          Anesthesia, Analgesia, Anxiolysis:  Type: Right upper shoulder Trigger Point Injection (1-2 muscle groups) #2  CPT: 20552 Primary Purpose: Therapeutic Region: Posterior  shoulder and upper back region Level: Cervico-thoracic Target Area: Trapezius, levator scapula, supraspinatus, longissimus thoracis Trigger Point Approach: Percutaneous, ipsilateral approach. Laterality: Right-Sided        Type: Local Anesthesia Local Anesthetic: Lidocaine 1-2% Sedation: None  Indication(s):  Analgesia Route: Infiltration ([place]/IM) IV Access: N/A   Position: Sitting   1. Trigger point of shoulder region (Right)   2. Myofascial pain syndrome of thoracic spine (serratus muscle) (Right)   3. Musculoskeletal neck pain (trapezius)   4. Chronic upper back pain (4th area of Pain) (Bilateral) (L>R)   5. Chronic shoulder pain (Bilateral) (L>R)    NAS-11 Pain score:   Pre-procedure: 4 /10   Post-procedure: 4 /10     Effectiveness:  Initial hour after procedure: 100 %. Subsequent 4-6 hours post-procedure: 100 %. Analgesia  past initial 6 hours: 100 % (ongoing). Ongoing improvement:  Analgesic: The patient indicates having an ongoing 100% relief of the pain that he was experiencing in the right shoulder area. Function: Jose Washington reports improvement in function ROM: Jose Washington reports improvement in ROM  Pharmacotherapy Assessment   Opioid Analgesic: Hydrocodone/APAP 5/325, 1 tab PO QD (5 mg/day of hydrocodone) MME/day: 5 mg/day.   Monitoring: [place] PMP: PDMP reviewed during this encounter.       Pharmacotherapy: No side-effects or adverse reactions reported. Compliance: No problems identified. Effectiveness: Clinically acceptable. Plan: Refer to "POC". UDS:  Summary  Date Value Ref Range Status  04/19/2022 Note  Final    Comment:    ==================================================================== [employer] Select 13 (MW) ==================================================================== Test                             Result       Flag       Units  Drug Present and Declared for Prescription Verification   Hydrocodone                    520          EXPECTED   ng/mg creat   Dihydrocodeine                 94           EXPECTED   ng/mg creat   Norhydrocodone                 307          EXPECTED   ng/mg creat    Sources of hydrocodone include scheduled prescription medications.    Dihydrocodeine and norhydrocodone are expected metabolites of    hydrocodone. Dihydrocodeine is also available as a scheduled    prescription medication.  ==================================================================== Test  Result    Flag   Units      Ref Range   Creatinine              301              mg/dL      >=20 ==================================================================== Declared Medications:  The flagging and interpretation on this report are based on the  following declared medications.  Unexpected results may arise from  inaccuracies in the declared medications.    **Note: The testing scope of this panel includes these medications:   Hydrocodone (Norco)   **Note: The testing scope of this panel does not include the  following reported medications:   Acetaminophen (Tylenol)  Acetaminophen (Norco)  Atenolol (Tenormin)  Atorvastatin (Lipitor)  Baclofen (Lioresal)  Buspirone (Buspar)  Calcium  Cyanocobalamin  Desloratadine (Clarinex)  Diphenhydramine  Docusate (Colace)  Duloxetine (Cymbalta)  Fenofibrate  Fluticasone (Flonase)  Glucosamine  Hydroxyzine (Atarax)  Ibuprofen (Advil)  Levalbuterol (Xopenex)  Magnesium (Mag-Ox)  Melatonin  Methylsulfonylmethane  Multivitamin  Omeprazole (Prilosec)  Ondansetron (Zofran)  Potassium  Simethicone  Sucralfate (Carafate)  Supplement  Vitamin C  Vitamin D3  Vitamin E ==================================================================== For clinical consultation, please call [phone]. ====================================================================    No results found for: "CBDTHCR", "D8THCCBX", "D9THCCBX"   Laboratory Chemistry Profile   Renal Lab Results  Component Value Date   BUN 12 06/24/2022   CREATININE 1.05 06/24/2022   BCR 11 06/24/2022   GFRAA 84 12/24/2020   GFRNONAA >60 02/08/2021    Hepatic Lab Results  Component Value Date   AST 25 06/24/2022   ALT 23 06/24/2022   ALBUMIN 4.7 06/24/2022   ALKPHOS 58 06/24/2022   LIPASE 28 02/08/2021    Electrolytes Lab Results  Component Value Date   NA 141 06/24/2022   K 4.3 06/24/2022   CL 103 06/24/2022   CALCIUM 9.8 06/24/2022   MG 2.0 11/12/2016    Bone Lab Results  Component Value Date   VD25OH 35.3 12/25/2021   25OHVITD1 23 (L) 11/12/2016   25OHVITD2 <1.0 11/12/2016   25OHVITD3 23 11/12/2016    Inflammation (CRP: Acute Phase) (ESR: Chronic Phase) Lab Results  Component Value Date   CRP 1.4 (H) 11/12/2016   ESRSEDRATE 3 11/12/2016         Note: Above Lab results reviewed.  Imaging  DG PAIN  CLINIC C-ARM 1-60 MIN NO REPORT Fluoro was used, but no Radiologist interpretation will be provided.  Please refer to "NOTES" tab for provider progress note.  Assessment  The primary encounter diagnosis was Cervical facet syndrome (Bilateral) (L>R). Diagnoses of Cervicalgia (Bilateral), Spondylosis without myelopathy or radiculopathy, cervical region, Cervical DDD (C4-5, C5-6, C6-7 and C7-T1), Chronic neck pain (3ry area of Pain) (Bilateral), and Trigger point of shoulder region (Right) were also pertinent to this visit.  Plan of Care  Problem-specific:  No problem-specific Assessment & Plan notes found for this encounter.  Jose Washington has a current medication list which includes the following long-term medication(s): atenolol, atorvastatin, baclofen, calcium carbonate, diphenhydramine hcl, duloxetine, fenofibrate, hydrocodone-acetaminophen, [START ON 08/23/2022] hydrocodone-acetaminophen, [START ON 09/22/2022] hydrocodone-acetaminophen, levalbuterol, omeprazole, potassium, simethicone, and sucralfate.  Pharmacotherapy (Medications Ordered): No orders of the defined types were placed in this encounter.  Orders:  Orders Placed This Encounter  Procedures   CERVICAL FACET (MEDIAL BRANCH NERVE BLOCK)     Standing Status:   Future    Standing Expiration Date:   11/03/2022    Scheduling Instructions:     Procedure:  Diagnostic cervical facet medial branch block (without steroids) #2     Side: Right-sided     Level: C3-4, C4-5, C5-6 Facet joints (C3, C4, C5, C6, & C7 Medial Branch Nerves)     Sedation: Patient's choice.     Timeframe: As soon as schedule allows    Order Specific Question:   Where will this procedure be performed?    Answer:   [facility]   Follow-up plan:   Return for procedure (ECT): (R) C-FCT Blk #2 (NO STEROIDS).     Interventional Therapies  Risk  Complexity Considerations:   Estimated body mass index is 28.35 kg/m as calculated from the  following:   Height as of this encounter: '5\' 7"'  (1.702 m).   Weight as of this encounter: 181 lb (82.1 kg). Abnormal UDS (03/27/2020) & (08/20/20) (+) undisclosed carboxy-THC    Planned  Pending:   Diagnostic right cervical facet MBB (without steroids) #2    Under consideration:   Therapeutic right IA hip joint injection #2  Therapeutic right lumbar facet RFA #2  Diagnostic right cervical facet block (w/o steroids) #2  Possible therapeutic right cervical facet RFA #1    Completed:   Diagnostic/therapeutic right IA hip inj. x1 (12/09/2020) (100/100/95/90-95)  Therapeutic right lumbar facet RFA x2 (11/26/2021) (100/100/75/75)  Palliative right lumbar facet MBB x2 (06/07/2019) (100/100/100 x20 days/>75 x3 months)  Diagnostic/therapeutic left lumbar facet MBB x1 (06/07/2019) (100/100/100 x20 days/>75 x3 months)  Diagnostic/therapeutic left L4-5 LESI x2 (07/25/2018) (n/a)  Palliative left CESI x3 (02/16/2018) (100/100/100/100)  Diagnostic right cervical facet MBB x1 (03/13/2020) (100/100/100)  Diagnostic left cervical facet MBB x1 (01/10/2020) (100/100/100)  Diagnostic/therapeutic right shoulder region TPI/MNB x2 (07/19/2022) (100/100/100/100)    Therapeutic  Palliative (PRN) options:   Therapeutic lumbar facet RFA   Therapeutic/palliative lumbar facet MBB   Therapeutic L4-5 LESI   Therapeutic CESI   Therapeutic right IA hip joint injection #2  Diagnostic cervical facet MBB #2     Recent Visits Date Type Provider Dept  07/19/22 Office Visit Milinda Pointer, MD [facility]  Showing recent visits within past 90 days and meeting all other requirements Today's Visits Date Type Provider Dept  08/03/22 Office Visit Milinda Pointer, MD [facility]  Showing today's visits and meeting all other requirements Future Appointments Date Type Provider Dept  10/13/22 Appointment Milinda Pointer, MD [facility]  Showing future appointments within next 90 days  and meeting all other requirements  I discussed the assessment and treatment plan with the patient. The patient was provided an opportunity to ask questions and all were answered. The patient agreed with the plan and demonstrated an understanding of the instructions.  Patient advised to call back or seek an in-person evaluation if the symptoms or condition worsens.  Duration of encounter: 12 minutes.  Note by: Gaspar Cola, MD Date: 08/03/2022; Time: 10:30 AM

## 2022-08-04 ENCOUNTER — Telehealth: Payer: Self-pay

## 2022-08-04 NOTE — Telephone Encounter (Signed)
I received an notification from [facility] inquiring about the anesthetic use for his requested cervical facet block. Can you address this in this message and I will upload the message to [facility].

## 2022-08-14 NOTE — Progress Notes (Unsigned)
PROVIDER NOTE: Interpretation of information contained herein should be left to medically-trained personnel. Specific patient instructions are provided elsewhere under "Patient Instructions" section of medical record. This document was created in part using STT-dictation technology, any transcriptional errors that may result from this process are unintentional.  Patient: Jose Washington Type: Established DOB: 07-19-1966 MRN: [id number] PCP: Jon Billings, NP  Service: Procedure DOS: 08/19/2022 Setting: Ambulatory Location: Ambulatory outpatient facility Delivery: Face-to-face Provider: Gaspar Cola, MD Specialty: Interventional Pain Management Specialty designation: 09 Location: Outpatient facility Ref. Prov.: Milinda Pointer, MD     Procedure:           Type: Cervical Facet Medial Branch Block(s) #2 (NO STEROIDS) Laterality: Right  Level: C3, C4, C5, C6, & C7 Medial Branch Level(s). Injecting these levels blocks the C3-4, C4-5, C5-6, and C6-7 cervical facet joints.  Imaging: Fluoroscopic guidance Anesthesia: Local anesthesia (1-2% Lidocaine) Anxiolysis: None Sedation: No Sedation                       DOS: 08/19/2022  Performed by: Gaspar Cola, MD  Purpose: Diagnostic/Therapeutic Indications: Cervicalgia (cervical spine axial pain) severe enough to impact quality of life or function. 1. Cervical facet syndrome (Bilateral) (L>R)   2. Cervicalgia (Bilateral)   3. Cervical spondylosis   4. Spondylosis without myelopathy or radiculopathy, cervical region    NAS-11 Pain score:   Pre-procedure: 3 /10   Post-procedure: 0-No pain/10     Position / Prep / Materials:  Position: Prone. Head in cradle. C-spine slightly flexed. Prep solution: DuraPrep (Iodine Povacrylex [0.7% available iodine] and Isopropyl Alcohol, 74% w/w) Prep Area: Posterior Cervico-thoracic Region. From occipital ridge to tip of scapula, and from shoulder to shoulder. Entire posterior and  lateral neck surface. Materials:  Tray: Block Needle(s):  Type: Spinal  Gauge (G): 22"  Length: 3.5-in  Qty: 5  Pre-op H&P Assessment:  [name] is a 56 y.o. (year old), male patient, seen today for interventional treatment. He  has a past surgical history that includes eye muscle repair (1972 and 1975); bLEEDING ULCER (2012); Flexible sigmoidoscopy; Colonoscopy with propofol (N/A, 03/24/2021); and Esophagogastroduodenoscopy (egd) with propofol (N/A, 03/24/2021). [name] has a current medication list which includes the following prescription(s): acetaminophen, prevagen extra strength, atenolol, atorvastatin, baclofen, buspirone, calcium carbonate, vitamin d3, desloratadine-pseudoephedrine, diphenhydramine hcl, docusate sodium, duloxetine, elderberry, fenofibrate, fluticasone, glucosamine-chondroit-vit c-mn, hydrocodone-acetaminophen, [START ON 08/23/2022] hydrocodone-acetaminophen, [START ON 09/22/2022] hydrocodone-acetaminophen, hydroxyzine, ibuprofen, levalbuterol, magnesium oxide, melatonin, methylsulfonylmethane, multivitamin, omeprazole, ondansetron, potassium, simethicone, sucralfate, cyanocobalamin, and vitamin e, and the following Facility-Administered Medications: fentanyl, lactated ringers, midazolam, and pentafluoroprop-tetrafluoroeth. His primarily concern today is the Neck Pain (Right side )  Initial Vital Signs:  Pulse/HCG Rate: (!) 54ECG Heart Rate: (!) 57 Temp: (!) 97.1 F (36.2 C) Resp: 16 BP: 109/82 SpO2: 99 %  BMI: Estimated body mass index is 26.63 kg/m as calculated from the following:   Height as of this encounter: '5\' 7"'$  (1.702 m).   Weight as of this encounter: 170 lb (77.1 kg).  Risk Assessment: Allergies: Reviewed. He has No Known Allergies.  Allergy Precautions: None required Coagulopathies: Reviewed. None identified.  Blood-thinner therapy: None at this time Active Infection(s): Reviewed. None identified. [name] is afebrile  Site Confirmation: Mr.  Montesinos was asked to confirm the procedure and laterality before marking the site Procedure checklist: Completed Consent: Before the procedure and under the influence of no sedative(s), amnesic(s), or anxiolytics, the patient was informed of the treatment options, risks and possible  complications. To fulfill our ethical and legal obligations, as recommended by the American Medical Association's Code of Ethics, I have informed the patient of my clinical impression; the nature and purpose of the treatment or procedure; the risks, benefits, and possible complications of the intervention; the alternatives, including doing nothing; the risk(s) and benefit(s) of the alternative treatment(s) or procedure(s); and the risk(s) and benefit(s) of doing nothing. The patient was provided information about the general risks and possible complications associated with the procedure. These may include, but are not limited to: failure to achieve desired goals, infection, bleeding, organ or nerve damage, allergic reactions, paralysis, and death. In addition, the patient was informed of those risks and complications associated to Spine-related procedures, such as failure to decrease pain; infection (i.e.: Meningitis, epidural or intraspinal abscess); bleeding (i.e.: epidural hematoma, subarachnoid hemorrhage, or any other type of intraspinal or peri-dural bleeding); organ or nerve damage (i.e.: Any type of peripheral nerve, nerve root, or spinal cord injury) with subsequent damage to sensory, motor, and/or autonomic systems, resulting in permanent pain, numbness, and/or weakness of one or several areas of the body; allergic reactions; (i.e.: anaphylactic reaction); and/or death. Furthermore, the patient was informed of those risks and complications associated with the medications. These include, but are not limited to: allergic reactions (i.e.: anaphylactic or anaphylactoid reaction(s)); adrenal axis suppression; blood sugar  elevation that in diabetics may result in ketoacidosis or comma; water retention that in patients with history of congestive heart failure may result in shortness of breath, pulmonary edema, and decompensation with resultant heart failure; weight gain; swelling or edema; medication-induced neural toxicity; particulate matter embolism and blood vessel occlusion with resultant organ, and/or nervous system infarction; and/or aseptic necrosis of one or more joints. Finally, the patient was informed that Medicine is not an exact science; therefore, there is also the possibility of unforeseen or unpredictable risks and/or possible complications that may result in a catastrophic outcome. The patient indicated having understood very clearly. We have given the patient no guarantees and we have made no promises. Enough time was given to the patient to ask questions, all of which were answered to the patient's satisfaction. Mr. Iovino has indicated that he wanted to continue with the procedure. Attestation: I, the ordering provider, attest that I have discussed with the patient the benefits, risks, side-effects, alternatives, likelihood of achieving goals, and potential problems during recovery for the procedure that I have provided informed consent. Date  Time: 08/19/2022  9:10 AM  Pre-Procedure Preparation:  Monitoring: As per clinic protocol. Respiration, ETCO2, SpO2, BP, heart rate and rhythm monitor placed and checked for adequate function Safety Precautions: Patient was assessed for positional comfort and pressure points before starting the procedure. Time-out: I initiated and conducted the "Time-out" before starting the procedure, as per protocol. The patient was asked to participate by confirming the accuracy of the "Time Out" information. Verification of the correct person, site, and procedure were performed and confirmed by me, the nursing staff, and the patient. "Time-out" conducted as per Joint  Commission's Universal Protocol (UP.01.01.01). Time: 0928  Description/Narrative of Procedure:          Laterality: Right Targeted Levels:  C3, C4, C5, C6, & C7 Medial Branch Level(s).  Rationale (medical necessity): procedure needed and proper for the diagnosis and/or treatment of the patient's medical symptoms and needs. Procedural Technique Safety Precautions: Aspiration looking for blood return was conducted prior to all injections. At no point did we inject any substances, as a needle was being advanced.  No attempts were made at seeking any paresthesias. Safe injection practices and needle disposal techniques used. Medications properly checked for expiration dates. SDV (single dose vial) medications used. Description of the Procedure: Protocol guidelines were followed. The patient was assisted into a comfortable position. The target area was identified and the area prepped in the usual manner. Skin & deeper tissues infiltrated with local anesthetic. Appropriate amount of time allowed to pass for local anesthetics to take effect. The procedure needles were then advanced to the target area. Proper needle placement secured. Negative aspiration confirmed. Solution injected in intermittent fashion, asking for systemic symptoms every 0.5cc of injectate. The needles were then removed and the area cleansed, making sure to leave some of the prepping solution back to take advantage of its long term bactericidal properties.  Technical description of process:  C3 Medial Branch Nerve Block (MBB): The target area for the C3 dorsal medial articular branch is the lateral concave waist of the articular pillar of C3. Under fluoroscopic guidance, a Quincke needle was inserted until contact was made with os over the postero-lateral aspect of the articular pillar of C3 (target area). After negative aspiration for blood, 0.5 mL of the nerve block solution was injected without difficulty or complication. The needle was  removed intact. C4 Medial Branch Nerve Block (MBB): The target area for the C4 dorsal medial articular branch is the lateral concave waist of the articular pillar of C4. Under fluoroscopic guidance, a Quincke needle was inserted until contact was made with os over the postero-lateral aspect of the articular pillar of C4 (target area). After negative aspiration for blood, 0.5 mL of the nerve block solution was injected without difficulty or complication. The needle was removed intact. C5 Medial Branch Nerve Block (MBB): The target area for the C5 dorsal medial articular branch is the lateral concave waist of the articular pillar of C5. Under fluoroscopic guidance, a Quincke needle was inserted until contact was made with os over the postero-lateral aspect of the articular pillar of C5 (target area). After negative aspiration for blood, 0.5 mL of the nerve block solution was injected without difficulty or complication. The needle was removed intact. C6 Medial Branch Nerve Block (MBB): The target area for the C6 dorsal medial articular branch is the lateral concave waist of the articular pillar of C6. Under fluoroscopic guidance, a Quincke needle was inserted until contact was made with os over the postero-lateral aspect of the articular pillar of C6 (target area). After negative aspiration for blood, 0.5 mL of the nerve block solution was injected without difficulty or complication. The needle was removed intact. C7 Medial Branch Nerve Block (MBB): The target for the C7 dorsal medial articular branch lies on the superior-medial tip of the C7 transverse process. Under fluoroscopic guidance, a Quincke needle was inserted until contact was made with os over the postero-lateral aspect of the articular pillar of C7 (target area). After negative aspiration for blood, 0.5 mL of the nerve block solution was injected without difficulty or complication. The needle was removed intact.  Once the entire procedure was  completed, the treated area was cleaned, making sure to leave some of the prepping solution back to take advantage of its long term bactericidal properties.  Anatomy Reference Guide:       Vitals:   08/19/22 0923 08/19/22 0928 08/19/22 0932 08/19/22 0938  BP: 119/79 123/89 (!) 139/91 (!) 126/96  Pulse:      Resp: 16 19 (!) 22 18  Temp:  TempSrc:      SpO2: 98% 97% 97% 98%  Weight:      Height:         Start Time: 0928 hrs. End Time: 0936 hrs.  Imaging Guidance (Spinal):          Type of Imaging Technique: Fluoroscopy Guidance (Spinal) Indication(s): Assistance in needle guidance and placement for procedures requiring needle placement in or near specific anatomical locations not easily accessible without such assistance. Exposure Time: Please see nurses notes. Contrast: None used. Fluoroscopic Guidance: I was personally present during the use of fluoroscopy. "Tunnel Vision Technique" used to obtain the best possible view of the target area. Parallax error corrected before commencing the procedure. "Direction-depth-direction" technique used to introduce the needle under continuous pulsed fluoroscopy. Once target was reached, antero-posterior, oblique, and lateral fluoroscopic projection used confirm needle placement in all planes. Images permanently stored in EMR. Interpretation: No contrast injected. I personally interpreted the imaging intraoperatively. Adequate needle placement confirmed in multiple planes. Permanent images saved into the patient's record.  Post-operative Assessment:  Post-procedure Vital Signs:  Pulse/HCG Rate: (!) 5460 Temp: (!) 97.1 F (36.2 C) Resp: 18 BP: (!) 126/96 SpO2: 98 %  EBL: None  Complications: No immediate post-treatment complications observed by team, or reported by patient.  Note: The patient tolerated the entire procedure well. A repeat set of vitals were taken after the procedure and the patient was kept under observation following  institutional policy, for this type of procedure. Post-procedural neurological assessment was performed, showing return to baseline, prior to discharge. The patient was provided with post-procedure discharge instructions, including a section on how to identify potential problems. Should any problems arise concerning this procedure, the patient was given instructions to immediately contact us, at any time, without hesitation. In any case, we plan to contact the patient by telephone for a follow-up status report regarding this interventional procedure.  Comments:  No additional relevant information.  Plan of Care  Orders:  Orders Placed This Encounter  Procedures   CERVICAL FACET (MEDIAL BRANCH NERVE BLOCK)     Scheduling Instructions:     Side: Right-sided     Level: C3-4, C4-5, C5-6 Facet joints (C3, C4, C5, C6, & C7 Medial Branch Nerves)     Sedation: Patient's choice.     Timeframe: Today    Order Specific Question:   Where will this procedure be performed?    Answer:   [facility]   DG [facility] C-ARM 1-60 MIN NO REPORT    Intraoperative interpretation by procedural physician at [place].    Standing Status:   Standing    Number of Occurrences:   1    Order Specific Question:   Reason for exam:    Answer:   Assistance in needle guidance and placement for procedures requiring needle placement in or near specific anatomical locations not easily accessible without such assistance.   Informed Consent Details: Physician/Practitioner Attestation; Transcribe to consent form and obtain patient signature    Note: Always confirm laterality of pain with Mr. Downie, before procedure. Transcribe to consent form and obtain patient signature.    Order Specific Question:   Physician/Practitioner attestation of informed consent for procedure/surgical case    Answer:   I, the physician/practitioner, attest that I have discussed with the patient the benefits, risks, side effects,  alternatives, likelihood of achieving goals and potential problems during recovery for the procedure that I have provided informed consent.    Order Specific Question:  Procedure    Answer:   Right Cervical facet block under fluoroscopic guidance.    Order Specific Question:   Physician/Practitioner performing the procedure    Answer:   Willamina Grieshop A. Dossie Arbour, MD    Order Specific Question:   Indication/Reason    Answer:   Chronic neck pain secondary to cervical facet syndrome   Provide equipment / supplies at bedside    "Block Tray" (Disposable  single use) Needle type: SpinalSpinal Amount/quantity: 5 Size: Regular (3.5-inch) Gauge: 22G    Standing Status:   Standing    Number of Occurrences:   1    Order Specific Question:   Specify    Answer:   Block Tray   Chronic Opioid Analgesic:  Hydrocodone/APAP 5/325, 1 tab PO QD (5 mg/day of hydrocodone) MME/day: 5 mg/day.   Medications ordered for procedure: Meds ordered this encounter  Medications   lidocaine (XYLOCAINE) 2 % (with pres) injection 400 mg   pentafluoroprop-tetrafluoroeth (GEBAUERS) aerosol   lactated ringers infusion   midazolam (VERSED) 5 MG/5ML injection 0.5-2 mg    Make sure Flumazenil is available in the pyxis when using this medication. If oversedation occurs, administer 0.2 mg IV over 15 sec. If after 45 sec no response, administer 0.2 mg again over 1 min; may repeat at 1 min intervals; not to exceed 4 doses (1 mg)   fentaNYL (SUBLIMAZE) injection 25-50 mcg    Make sure Narcan is available in the pyxis when using this medication. In the event of respiratory depression (RR< 8/min): Titrate NARCAN (naloxone) in increments of 0.1 to 0.2 mg IV at 2-3 minute intervals, until desired degree of reversal.   ropivacaine (PF) 2 mg/mL (0.2%) (NAROPIN) injection 9 mL   Medications administered: We administered lidocaine and ropivacaine (PF) 2 mg/mL (0.2%).  See the medical record for exact dosing, route, and time of  administration.  Follow-up plan:   Return in about 2 weeks (around 09/02/2022) for Proc-day (T,Th), (VV), (PPE).       Interventional Therapies  Risk  Complexity Considerations:   Estimated body mass index is 28.35 kg/m as calculated from the following:   Height as of this encounter: '5\' 7"'$  (1.702 m).   Weight as of this encounter: 181 lb (82.1 kg). Abnormal UDS (03/27/2020) & (08/20/20) (+) undisclosed carboxy-THC    Planned  Pending:   Diagnostic right cervical facet MBB (without steroids) #2    Under consideration:   Therapeutic right IA hip joint injection #2  Therapeutic right lumbar facet RFA #2  Diagnostic right cervical facet block (w/o steroids) #2  Possible therapeutic right cervical facet RFA #1    Completed:   Diagnostic/therapeutic right IA hip inj. x1 (12/09/2020) (100/100/95/90-95)  Therapeutic right lumbar facet RFA x2 (11/26/2021) (100/100/75/75)  Palliative right lumbar facet MBB x2 (06/07/2019) (100/100/100 x20 days/>75 x3 months)  Diagnostic/therapeutic left lumbar facet MBB x1 (06/07/2019) (100/100/100 x20 days/>75 x3 months)  Diagnostic/therapeutic left L4-5 LESI x2 (07/25/2018) (n/a)  Palliative left CESI x3 (02/16/2018) (100/100/100/100)  Diagnostic right cervical facet MBB x1 (03/13/2020) (100/100/100)  Diagnostic left cervical facet MBB x1 (01/10/2020) (100/100/100)  Diagnostic/therapeutic right shoulder region TPI/MNB x2 (07/19/2022) (100/100/100/100)    Therapeutic  Palliative (PRN) options:   Therapeutic lumbar facet RFA   Therapeutic/palliative lumbar facet MBB   Therapeutic L4-5 LESI   Therapeutic CESI   Therapeutic right IA hip joint injection #2  Diagnostic cervical facet MBB #2      Recent Visits Date Type Provider Dept  08/03/22 Office Visit Milinda Pointer,  MD [facility]  07/19/22 Office Visit Milinda Pointer, MD [facility]  Showing recent visits within past 90 days and meeting all other requirements Today's  Visits Date Type Provider Dept  08/19/22 Procedure visit Milinda Pointer, MD [facility]  Showing today's visits and meeting all other requirements Future Appointments Date Type Provider Dept  08/31/22 Appointment Milinda Pointer, [facility]  10/13/22 Appointment Milinda Pointer, MD [facility]  Showing future appointments within next 90 days and meeting all other requirements  Disposition: Discharge home  Discharge (Date  Time): 08/19/2022;   hrs.   Primary Care Physician: Jon Billings, NP Location: [facility] Note by: Gaspar Cola, MD Date: 08/19/2022; Time: 10:13 AM  Disclaimer:  Medicine is not an Chief Strategy Officer. The only guarantee in medicine is that nothing is guaranteed. It is important to note that the decision to proceed with this intervention was based on the information collected from the patient. The Data and conclusions were drawn from the patient's questionnaire, the interview, and the physical examination. Because the information was provided in large part by the patient, it cannot be guaranteed that it has not been purposely or unconsciously manipulated. Every effort has been made to obtain as much relevant data as possible for this evaluation. It is important to note that the conclusions that lead to this procedure are derived in large part from the available data. Always take into account that the treatment will also be dependent on availability of resources and existing treatment guidelines, considered by other Pain Management Practitioners as being common knowledge and practice, at the time of the intervention. For Medico-Legal purposes, it is also important to point out that variation in procedural techniques and pharmacological choices are the acceptable norm. The indications, contraindications, technique, and results of the above procedure should only be interpreted and judged by a  Board-Certified Interventional Pain Specialist with extensive familiarity and expertise in the same exact procedure and technique.

## 2022-08-19 ENCOUNTER — Ambulatory Visit
Admission: RE | Admit: 2022-08-19 | Discharge: 2022-08-19 | Disposition: A | Discharge status: Home / Self Care | Payer: No Typology Code available for payment source | Source: Ambulatory Visit | Attending: Pain Medicine | Admitting: Pain Medicine

## 2022-08-19 ENCOUNTER — Encounter: Payer: Self-pay | Admitting: Pain Medicine

## 2022-08-19 ENCOUNTER — Ambulatory Visit: Payer: No Typology Code available for payment source | Attending: Pain Medicine | Admitting: Pain Medicine

## 2022-08-19 VITALS — BP 124/76 | HR 63 | Temp 97.1°F | Resp 18 | Ht 67.0 in | Wt 170.0 lb

## 2022-08-19 DIAGNOSIS — G8929 Other chronic pain: Secondary | ICD-10-CM | POA: Diagnosis present

## 2022-08-19 DIAGNOSIS — M5030 Other cervical disc degeneration, unspecified cervical region: Secondary | ICD-10-CM | POA: Diagnosis present

## 2022-08-19 DIAGNOSIS — M542 Cervicalgia: Secondary | ICD-10-CM | POA: Insufficient documentation

## 2022-08-19 DIAGNOSIS — M47812 Spondylosis without myelopathy or radiculopathy, cervical region: Secondary | ICD-10-CM | POA: Insufficient documentation

## 2022-08-19 MED ORDER — LIDOCAINE HCL 2 % IJ SOLN
INTRAMUSCULAR | Status: AC
  Filled 2022-08-19: qty 20

## 2022-08-19 MED ORDER — ROPIVACAINE HCL 2 MG/ML IJ SOLN
9.0000 mL | Freq: Once | INTRAMUSCULAR | Status: AC
  Administered 2022-08-19: 9 mL via PERINEURAL

## 2022-08-19 MED ORDER — PENTAFLUOROPROP-TETRAFLUOROETH EX AERO: INHALATION_SPRAY | Freq: Once | CUTANEOUS | Status: DC

## 2022-08-19 MED ORDER — FENTANYL CITRATE (PF) 100 MCG/2ML IJ SOLN: 25.0000 ug | INTRAMUSCULAR | Status: DC | PRN

## 2022-08-19 MED ORDER — MIDAZOLAM HCL 5 MG/5ML IJ SOLN: 0.5000 mg | Freq: Once | INTRAMUSCULAR | Status: DC

## 2022-08-19 MED ORDER — LIDOCAINE HCL 2 % IJ SOLN
20.0000 mL | Freq: Once | INTRAMUSCULAR | Status: AC
  Administered 2022-08-19: 400 mg

## 2022-08-19 MED ORDER — LACTATED RINGERS IV SOLN: Freq: Once | INTRAVENOUS | Status: DC

## 2022-08-19 MED ORDER — ROPIVACAINE HCL 2 MG/ML IJ SOLN
INTRAMUSCULAR | Status: AC
  Filled 2022-08-19: qty 20

## 2022-08-19 NOTE — Patient Instructions (Signed)
____________________________________________________________________________________________  Virtual Visits   What is a "Virtual Visit"? It is a healthcare communication encounter (medical visit) that takes place on real time (NOT TEXT or E-MAIL) over the telephone or computer device (desktop, laptop, tablet, smart phone, etc.). It allows for more location flexibility between the patient and the healthcare provider.  Who decides when these types of visits will be used? The physician.  Who is eligible for these types of visits? Only those patients that can be reliably reached over the telephone.  What do you mean by reliably? We do not have time to call everyone multiple times, therefore those that tend to screen calls and then call back later are not suitable candidates for this system. We understand how people are reluctant to pickup on "unknown" calls, therefore, we suggest adding our telephone numbers to your list of "CONTACT(s)". This way, you should be able to readily identify our calls when you receive one. All of our numbers are available below.   Who is not eligible? This option is not available for medication management encounters, specially for controlled substances. Patients on pain medications that fall under the category of controlled substances have to come in for "Face-to-Face" encounters. This is required for mandatory monitoring of these substances. You may be asked to provide a sample for an unannounced urine drug screening test (UDS), and we will need to count your pain pills. Not bringing your pills to be counted may result in no refill. Obviously, neither one of these can be done over the phone.  When will this type of visits be used? You can request a virtual visit whenever you are physically unable to attend a regular appointment. The decision will be made by the physician (or healthcare provider) on a case by case basis.   At what time will I be called? This is an  excellent question. The providers will try to call you whenever they have time available. Do not expect to be called at any specific time. The secretaries will assign you a time for your virtual visit appointment, but this is done simply to keep a list of those patients that need to be called, but not for the purpose of keeping a time schedule. Be advised that the call may come in anytime during the day, between the hours of 8:00 AM and 8::00 PM, depending on provider availability. We do understand that the system is not perfect. If you are unable to be available that day on a moments notice, then request an "in-person" appointment rather than a "virtual visit".  Can I request my medication visits to be "Virtual"? Yes you may request it, but the decision is entirely up to the healthcare provider. Control substances require specific monitoring that requires Face-to-Face encounters. The number of encounters  and the extent of the monitoring is determined on a case by case basis.  Add a new contact to your smart phone and label it "PAIN CLINIC" Under this contact add the following numbers: Main: [phone] (Official Contact Number) Nurses: [phone] (These are outgoing only calling systems. Do not call this number.) Dr. Haani Bakula: [phone] or [phone] (Outgoing calls only. Do not call this number.)  ____________________________________________________________________________________________    ____________________________________________________________________________________________  Post-Procedure Discharge Instructions  Instructions: Apply ice:  Purpose: This will minimize any swelling and discomfort after procedure.  When: Day of procedure, as soon as you get home. How: Fill a plastic sandwich bag with crushed ice. Cover it with a small towel and apply   to injection site. How long: (15 min on, 15 min off) Apply for 15 minutes then remove x 15 minutes.  Repeat sequence on  day of procedure, until you go to bed. Apply heat:  Purpose: To treat any soreness and discomfort from the procedure. When: Starting the next day after the procedure. How: Apply heat to procedure site starting the day following the procedure. How long: May continue to repeat daily, until discomfort goes away. Food intake: Start with clear liquids (like water) and advance to regular food, as tolerated.  Physical activities: Keep activities to a minimum for the first 8 hours after the procedure. After that, then as tolerated. Driving: If you have received any sedation, be responsible and do not drive. You are not allowed to drive for 24 hours after having sedation. Blood thinner: (Applies only to those taking blood thinners) You may restart your blood thinner 6 hours after your procedure. Insulin: (Applies only to Diabetic patients taking insulin) As soon as you can eat, you may resume your normal dosing schedule. Infection prevention: Keep procedure site clean and dry. Shower daily and clean area with soap and water. Post-procedure Pain Diary: Extremely important that this be done correctly and accurately. Recorded information will be used to determine the next step in treatment. For the purpose of accuracy, follow these rules: Evaluate only the area treated. Do not report or include pain from an untreated area. For the purpose of this evaluation, ignore all other areas of pain, except for the treated area. After your procedure, avoid taking a long nap and attempting to complete the pain diary after you wake up. Instead, set your alarm clock to go off every hour, on the hour, for the initial 8 hours after the procedure. Document the duration of the numbing medicine, and the relief you are getting from it. Do not go to sleep and attempt to complete it later. It will not be accurate. If you received sedation, it is likely that you were given a medication that may cause amnesia. Because of this,  completing the diary at a later time may cause the information to be inaccurate. This information is needed to plan your care. Follow-up appointment: Keep your post-procedure follow-up evaluation appointment after the procedure (usually 2 weeks for most procedures, 6 weeks for radiofrequencies). DO NOT FORGET to bring you pain diary with you.   Expect: (What should I expect to see with my procedure?) From numbing medicine (AKA: Local Anesthetics): Numbness or decrease in pain. You may also experience some weakness, which if present, could last for the duration of the local anesthetic. Onset: Full effect within 15 minutes of injected. Duration: It will depend on the type of local anesthetic used. On the average, 1 to 8 hours.  From steroids (Applies only if steroids were used): Decrease in swelling or inflammation. Once inflammation is improved, relief of the pain will follow. Onset of benefits: Depends on the amount of swelling present. The more swelling, the longer it will take for the benefits to be seen. In some cases, up to 10 days. Duration: Steroids will stay in the system x 2 weeks. Duration of benefits will depend on multiple posibilities including persistent irritating factors. Side-effects: If present, they may typically last 2 weeks (the duration of the steroids). Frequent: Cramps (if they occur, drink Gatorade and take over-the-counter Magnesium 450-500 mg once to twice a day); water retention with temporary weight gain; increases in blood sugar; decreased immune system response; increased appetite. Occasional: Facial flushing (  red, warm cheeks); mood swings; menstrual changes. Uncommon: Long-term decrease or suppression of natural hormones; bone thinning. (These are more common with higher doses or more frequent use. This is why we prefer that our patients avoid having any injection therapies in other practices.)  Very Rare: Severe mood changes; psychosis; aseptic necrosis. From  procedure: Some discomfort is to be expected once the numbing medicine wears off. This should be minimal if ice and heat are applied as instructed.  Call if: (When should I call?) You experience numbness and weakness that gets worse with time, as opposed to wearing off. New onset bowel or bladder incontinence. (Applies only to procedures done in the spine)  Emergency Numbers: Durning business hours (Monday - Thursday, 8:00 AM - 4:00 PM) (Friday, 9:00 AM - 12:00 Noon): [phone] After hours: [phone] NOTE: If you are having a problem and are unable connect with, or to talk to a provider, then go to your nearest urgent care or emergency department. If the problem is serious and urgent, please call 911. ____________________________________________________________________________________________    

## 2022-08-19 NOTE — Progress Notes (Signed)
Safety precautions to be maintained throughout the outpatient stay will include: orient to surroundings, keep bed in low position, maintain call bell within reach at all times, provide assistance with transfer out of bed and ambulation.  

## 2022-08-20 ENCOUNTER — Telehealth: Payer: Self-pay

## 2022-08-20 NOTE — Telephone Encounter (Signed)
Post procedure phone call.  Patient states he is doing great.  

## 2022-08-26 ENCOUNTER — Other Ambulatory Visit: Payer: Self-pay | Admitting: Nurse Practitioner

## 2022-08-26 DIAGNOSIS — M62838 Other muscle spasm: Secondary | ICD-10-CM

## 2022-08-26 DIAGNOSIS — G8929 Other chronic pain: Secondary | ICD-10-CM

## 2022-08-26 NOTE — Telephone Encounter (Signed)
Requested Prescriptions  Pending Prescriptions Disp Refills  . baclofen (LIORESAL) 10 MG tablet [Pharmacy Med Name: BACLOFEN 10 MG TABLET] 90 tablet 0    Sig: TAKE 1 TABLET BY MOUTH EVERYDAY AT BEDTIME     Analgesics:  Muscle Relaxants - baclofen Passed - 08/26/2022  6:29 AM      Passed - Cr in normal range and within 180 days    Creatinine, Ser  Date Value Ref Range Status  06/24/2022 1.05 0.76 - 1.27 mg/dL Final         Passed - eGFR is 30 or above and within 180 days    GFR calc Af Amer  Date Value Ref Range Status  12/24/2020 84 >59 mL/min/1.73 Final    Comment:    **In accordance with recommendations from the NKF-ASN Task force,**   [facility] is in the process of updating its eGFR calculation to the   2021 CKD-EPI creatinine equation that estimates kidney function   without a race variable.    GFR, Estimated  Date Value Ref Range Status  02/08/2021 >60 >60 mL/min Final    Comment:    (NOTE) Calculated using the CKD-EPI Creatinine Equation (2021)    eGFR  Date Value Ref Range Status  06/24/2022 83 >59 mL/min/1.73 Final         Passed - Valid encounter within last 6 months    Recent Outpatient Visits          2 months ago Aortic atherosclerosis ([place])   [facility] Jon Billings, NP   8 months ago Annual physical exam   [place], NP   10 months ago Upper respiratory tract infection, unspecified type   [facility] [place], Lauren A, NP   1 year ago Aortic atherosclerosis ([place])   [facility] Jon Billings, NP   1 year ago Benign essential hypertension   [place], Jolene T, NP      Future Appointments            In 4 months Jon Billings, NP [facility] [place]

## 2022-08-30 NOTE — Progress Notes (Unsigned)
Patient: Jose Washington  Service Category: E/M  Provider: Gaspar Cola, MD  DOB: September 12, 1966  DOS: 08/31/2022  Location: Office  MRN: [id number]  Setting: Ambulatory outpatient  Referring Provider: Jon Billings, NP  Type: Established Patient  Specialty: Interventional Pain Management  PCP: Jon Billings, NP  Location: Remote location  Delivery: TeleHealth     Virtual Encounter - Pain Management PROVIDER NOTE: Information contained herein reflects review and annotations entered in association with encounter. Interpretation of such information and data should be left to medically-trained personnel. Information provided to patient can be located elsewhere in the medical record under "Patient Instructions". Document created using STT-dictation technology, any transcriptional errors that may result from process are unintentional.    Contact & Pharmacy Preferred: [phone] Home: [phone] (home) Mobile: [phone] (mobile) E-mail: [email or web address] [id number], [place] [address] Phone: [phone] Fax: [phone]  [id number] [id number]: [phone]: [phone]  Pre-screening  Mr. SDupleroffered "in-person" vs "virtual" encounter. He indicated preferring virtual for this encounter.   Reason COVID-19*  Social distancing based on CDC and AMA recommendations.   I contacted CLaural Roeson 08/31/2022 via telephone.      I clearly identified myself as FGaspar Cola MD. I verified that I was speaking with the correct person using two identifiers (Name: CARDIS FULLWOOD and date of birth: 705/13/1967.  Consent I sought verbal advanced consent from [name] virtual visit interactions. I informed Mr. SGeimerof possible security and privacy concerns, risks, and limitations  associated with providing "not-in-person" medical evaluation and management services. I also informed Mr. SDegraziaof the availability of "in-person" appointments. Finally, I informed him that there would be a charge for the virtual visit and that he could be  personally, fully or partially, financially responsible for it. Mr. SIbarraexpressed understanding and agreed to proceed.   Historic Elements   Mr. CBAYDEN GILis a 56y.o. year old, male patient evaluated today after our last contact on 08/19/2022. Mr. SBair has a past medical history of Allergy, Anxiety, Chronic duodenal ulcer with hemorrhage (2012), Chronic neck pain, Depression, GERD (gastroesophageal reflux disease), Hyperlipidemia, Hypertension, and Microscopic hematuria. He also  has a past surgical history that includes eye muscle repair (1972 and 1975); bLEEDING ULCER (2012); Flexible sigmoidoscopy; Colonoscopy with propofol (N/A, 03/24/2021); and Esophagogastroduodenoscopy (egd) with propofol (N/A, 03/24/2021). Mr. SFraleyhas a current medication list which includes the following prescription(s): acetaminophen, prevagen extra strength, atenolol, atorvastatin, baclofen, buspirone, calcium carbonate, vitamin d3, desloratadine-pseudoephedrine, diphenhydramine hcl, docusate sodium, duloxetine, elderberry, fenofibrate, fluticasone, glucosamine-chondroit-vit c-mn, hydrocodone-acetaminophen, [START ON 09/22/2022] hydrocodone-acetaminophen, hydroxyzine, ibuprofen, levalbuterol, magnesium oxide, melatonin, methylsulfonylmethane, multivitamin, omeprazole, ondansetron, potassium, simethicone, sucralfate, cyanocobalamin, vitamin e, and hydrocodone-acetaminophen. He  reports that he has never smoked. He has never used smokeless tobacco. He reports that he does not drink alcohol and does not use drugs. Mr. SGuamanhas No Known Allergies.   HPI  Today, he is being contacted for a post-procedure assessment.  Post-procedure evaluation   Type:  Cervical Facet Medial Branch Block(s) #2 (NO STEROIDS) Laterality: Right  Level: C3, C4, C5, C6, & C7 Medial Branch Level(s). Injecting these levels blocks the C3-4, C4-5, C5-6, and C6-7 cervical facet joints.  Imaging: Fluoroscopic guidance Anesthesia: Local anesthesia (1-2% Lidocaine) Anxiolysis: None Sedation: No Sedation  DOS: 08/19/2022  Performed by: Gaspar Cola, MD  Purpose: Diagnostic/Therapeutic Indications: Cervicalgia (cervical spine axial pain) severe enough to impact quality of life or function. 1. Cervical facet syndrome (Bilateral) (L>R)   2. Cervicalgia (Bilateral)   3. Cervical spondylosis   4. Spondylosis without myelopathy or radiculopathy, cervical region    NAS-11 Pain score:   Pre-procedure: 3 /10   Post-procedure: 0-No pain/10      Effectiveness:  Initial hour after procedure: 100 %. Subsequent 4-6 hours post-procedure: 100 %. Analgesia past initial 6 hours: 100 % (ongoing). Ongoing improvement:  Analgesic: Surprisingly, the patient is still having an ongoing 95 to 100% relief of the pain even though we did not use any steroids.  He is interested in proceeding with the radiofrequency ablation. Function: Mr. Pohle reports improvement in function ROM: Mr. Trentman reports improvement in ROM  Statement of Medical Necessity:  Mr. Fanfan to experienced debilitating chronic nerve-associated pain from the Cervical Facet Syndrome (Spondylosis without myelopathy or radiculopathy, cervical region [M47.812]).  Duration: This pain has persisted for longer than three months.  Non-surgical care: The patient has either failed to respond, or was unable to tolerate, or simply did not get enough benefit from other more conservative therapies including, but not limited to: 1. Over-the-counter oral analgesic medications (i.e.: ibuprofen, naproxen, etc.) 2. Anti-inflammatory medications 3. Muscle relaxants 4. Membrane stabilizers 5.  Opioids 6. Physical therapy (PT), chiropractic manipulation, and/or home exercise program (HEP). 7. Modalities (Heat, ice, etc.)  Invasive therapies: Nerve blocks have failed to provide any significant long-term benefit.  Surgical care: Not indicated.  Physical exam: Has been consistent with Cervical Facet Syndrome.  Diagnostic imaging: Cervical Facet Arthropathy.                Diagnostic interventional therapies: Mr. Perkey has attained greater than 50% reduction in pain from at least two (2) diagnostic medial branch blocks conducted in separate occasions.   For the above listed reason, I believe, as the examining and treating physician, that it is medically necessary to proceed with Non-Pulsed Radiofrequency Ablation for the purpose of attempting to prolong the duration of the benefits seen with the diagnostic injections.  At this point, after having personally evaluated this patient and being the treating attending physician, it is my professional opinion, as a board-certified pain management specialist, that it is medically necessary for this patient to undergo the treatments that I am ordering.  Any attempt at denying these treatments, by a health care insurance entity, represents a case of "practicing medicine without a license", which is currently illegal in the state of [place].  Furthermore, if this company attempts to use a Mudlogger" to deny such treatment without having performed a face-to-face evaluation, represents an act of gross negligence and malpractice.   Pharmacotherapy Assessment   Opioid Analgesic: Hydrocodone/APAP 5/325, 1 tab PO QD (5 mg/day of hydrocodone) MME/day: 5 mg/day.   Monitoring: [place] PMP: PDMP reviewed during this encounter.       Pharmacotherapy: No side-effects or adverse reactions reported. Compliance: No problems identified. Effectiveness: Clinically acceptable. Plan: Refer to "POC". UDS:  Summary  Date Value Ref Range Status   04/19/2022 Note  Final    Comment:    ==================================================================== [employer] Select 13 (MW) ==================================================================== Test                             Result       Flag  Units  Drug Present and Declared for Prescription Verification   Hydrocodone                    520          EXPECTED   ng/mg creat   Dihydrocodeine                 94           EXPECTED   ng/mg creat   Norhydrocodone                 307          EXPECTED   ng/mg creat    Sources of hydrocodone include scheduled prescription medications.    Dihydrocodeine and norhydrocodone are expected metabolites of    hydrocodone. Dihydrocodeine is also available as a scheduled    prescription medication.  ==================================================================== Test                      Result    Flag   Units      Ref Range   Creatinine              301              mg/dL      >=20 ==================================================================== Declared Medications:  The flagging and interpretation on this report are based on the  following declared medications.  Unexpected results may arise from  inaccuracies in the declared medications.   **Note: The testing scope of this panel includes these medications:   Hydrocodone (Norco)   **Note: The testing scope of this panel does not include the  following reported medications:   Acetaminophen (Tylenol)  Acetaminophen (Norco)  Atenolol (Tenormin)  Atorvastatin (Lipitor)  Baclofen (Lioresal)  Buspirone (Buspar)  Calcium  Cyanocobalamin  Desloratadine (Clarinex)  Diphenhydramine  Docusate (Colace)  Duloxetine (Cymbalta)  Fenofibrate  Fluticasone (Flonase)  Glucosamine  Hydroxyzine (Atarax)  Ibuprofen (Advil)  Levalbuterol (Xopenex)  Magnesium (Mag-Ox)  Melatonin  Methylsulfonylmethane  Multivitamin  Omeprazole (Prilosec)  Ondansetron (Zofran)  Potassium   Simethicone  Sucralfate (Carafate)  Supplement  Vitamin C  Vitamin D3  Vitamin E ==================================================================== For clinical consultation, please call [phone]. ====================================================================    No results found for: "CBDTHCR", "D8THCCBX", "D9THCCBX"   Laboratory Chemistry Profile   Renal Lab Results  Component Value Date   BUN 12 06/24/2022   CREATININE 1.05 06/24/2022   BCR 11 06/24/2022   GFRAA 84 12/24/2020   GFRNONAA >60 02/08/2021    Hepatic Lab Results  Component Value Date   AST 25 06/24/2022   ALT 23 06/24/2022   ALBUMIN 4.7 06/24/2022   ALKPHOS 58 06/24/2022   LIPASE 28 02/08/2021    Electrolytes Lab Results  Component Value Date   NA 141 06/24/2022   K 4.3 06/24/2022   CL 103 06/24/2022   CALCIUM 9.8 06/24/2022   MG 2.0 11/12/2016    Bone Lab Results  Component Value Date   VD25OH 35.3 12/25/2021   25OHVITD1 23 (L) 11/12/2016   25OHVITD2 <1.0 11/12/2016   25OHVITD3 23 11/12/2016    Inflammation (CRP: Acute Phase) (ESR: Chronic Phase) Lab Results  Component Value Date   CRP 1.4 (H) 11/12/2016   ESRSEDRATE 3 11/12/2016         Note: Above Lab results reviewed.  Imaging  DG PAIN CLINIC C-ARM 1-60 MIN NO REPORT Fluoro was used, but no Radiologist interpretation will be provided.  Please refer to "NOTES"  tab for provider progress note.  Assessment  The primary encounter diagnosis was Cervical facet syndrome (Bilateral) (L>R). Diagnoses of Cervicalgia (Bilateral), Cervical spondylosis, Spondylosis without myelopathy or radiculopathy, cervical region, Cervical DDD (C4-5, C5-6, C6-7 and C7-T1), and Chronic neck pain (3ry area of Pain) (Bilateral) were also pertinent to this visit.  Plan of Care  Problem-specific:  No problem-specific Assessment & Plan notes found for this encounter.  Mr. ARMAN LOY has a current medication list which includes the following  long-term medication(s): atenolol, atorvastatin, baclofen, calcium carbonate, diphenhydramine hcl, duloxetine, fenofibrate, hydrocodone-acetaminophen, [START ON 09/22/2022] hydrocodone-acetaminophen, levalbuterol, omeprazole, potassium, simethicone, sucralfate, and hydrocodone-acetaminophen.  Pharmacotherapy (Medications Ordered): No orders of the defined types were placed in this encounter.  Orders:  Orders Placed This Encounter  Procedures   Radiofrequency,Cervical    Standing Status:   Future    Standing Expiration Date:   12/30/2022    Scheduling Instructions:     Side(s): Right-sided     Level(s): C3, C4, C5, C6, & C7 Medial Branch Nerve(s)     Sedation: Patient's choice.     Scheduling Timeframe: As soon as pre-approved    Order Specific Question:   Where will this procedure be performed?    Answer:   [facility]   Follow-up plan:   Return for (11mn), procedure (ECT): (R) C-FCT RFA #1.     Interventional Therapies  Risk  Complexity Considerations:   Estimated body mass index is 28.35 kg/m as calculated from the following:   Height as of this encounter: _0  (1.702 m).   Weight as of this encounter: 181 lb (82.1 kg). Abnormal UDS (03/27/2020) & (08/20/20) (+) undisclosed carboxy-THC    Planned  Pending:   Therapeutic right cervical facet RFA #1    Under consideration:   Therapeutic right IA hip joint injection #2  Therapeutic right lumbar facet RFA #2  Therapeutic right cervical facet RFA #1    Completed:   Diagnostic/therapeutic right IA hip inj. x1 (12/09/2020) (100/100/95/90-95)  Therapeutic right lumbar facet RFA x2 (11/26/2021) (100/100/75/75)  Palliative right lumbar facet MBB x2 (06/07/2019) (100/100/100 x20 days/>75 x3 months)  Diagnostic/therapeutic left lumbar facet MBB x1 (06/07/2019) (100/100/100 x20 days/>75 x3 months)  Diagnostic/therapeutic left L4-5 LESI x2 (07/25/2018) (n/a)  Palliative left CESI x3 (02/16/2018) (100/100/100/100)  Diagnostic  right cervical facet MBB x2 (08/19/2022) (100/100/100/100) (local anesthetic only diagnostic injections)  Diagnostic left cervical facet MBB x1 (01/10/2020) (100/100/100)  Diagnostic/therapeutic right shoulder region TPI/MNB x2 (07/19/2022) (100/100/100/100)    Therapeutic  Palliative (PRN) options:   Therapeutic lumbar facet RFA   Therapeutic/palliative lumbar facet MBB   Therapeutic L4-5 LESI   Therapeutic CESI   Therapeutic right IA hip joint injection #2  Diagnostic cervical facet MBB #2     Recent Visits Date Type Provider Dept  08/19/22 Procedure visit NMilinda Pointer MD [facility]  08/03/22 Office Visit NMilinda Pointer MD [facility]  07/19/22 Office Visit NMilinda Pointer MD [facility]  Showing recent visits within past 90 days and meeting all other requirements Today's Visits Date Type Provider Dept  08/31/22 Office Visit NMilinda Pointer MD [facility]  Showing today's visits and meeting all other requirements Future Appointments Date Type Provider Dept  10/13/22 Appointment NMilinda Pointer MD [facility]  Showing future appointments within next 90 days and meeting all other requirements  I discussed the assessment and treatment plan with the patient. The patient was provided an opportunity to ask questions and all were answered.  The patient agreed with the plan and demonstrated an understanding of the instructions.  Patient advised to call back or seek an in-person evaluation if the symptoms or condition worsens.  Duration of encounter: 14 minutes.  Note by: Gaspar Cola, MD Date: 08/31/2022; Time: 11:40 AM

## 2022-08-31 ENCOUNTER — Ambulatory Visit: Payer: No Typology Code available for payment source | Attending: Pain Medicine | Admitting: Pain Medicine

## 2022-08-31 DIAGNOSIS — M47812 Spondylosis without myelopathy or radiculopathy, cervical region: Secondary | ICD-10-CM

## 2022-08-31 DIAGNOSIS — G8929 Other chronic pain: Secondary | ICD-10-CM

## 2022-08-31 DIAGNOSIS — M5030 Other cervical disc degeneration, unspecified cervical region: Secondary | ICD-10-CM

## 2022-08-31 DIAGNOSIS — M542 Cervicalgia: Secondary | ICD-10-CM | POA: Diagnosis not present

## 2022-08-31 NOTE — Patient Instructions (Signed)
  ____________________________________________________________________________________________  Patient Information update  To: All of our patients.  Re: Name change.  It has been made official that our current name, "[facility]"   will soon be changed to " [facility]".   The purpose of this change is to eliminate any confusion created by the concept of our practice being a "Medication Management Pain Clinic". In the past this has led to the misconception that we treat pain primarily by the use of prescription medications.  Nothing can be farther from the truth.   Understanding PAIN MANAGEMENT: To further understand what our practice does, you first have to understand that "Pain Management" is a subspecialty that requires additional training once a physician has completed their specialty training, which can be in either Anesthesia, Neurology, Psychiatry, or Physical Medicine and Rehabilitation (PMR). Each one of these contributes to the final approach taken by each physician to the management of their patient's pain. To be a "Pain Management Specialist" you must have first completed one of the specialty trainings below.  Anesthesiologists - trained in clinical pharmacology and interventional techniques such as nerve blockade and regional as well as central neuroanatomy. They are trained to block pain before, during, and after surgical interventions.  Neurologists - trained in the diagnosis and pharmacological treatment of complex neurological conditions, such as Multiple Sclerosis, Parkinson's, spinal cord injuries, and other systemic conditions that may be associated with symptoms that may include but are not limited to pain. They tend to rely primarily on the treatment of chronic pain using prescription medications.  Psychiatrist - trained in conditions affecting the psychosocial  wellbeing of patients including but not limited to depression, anxiety, schizophrenia, personality disorders, addiction, and other substance use disorders that may be associated with chronic pain. They tend to rely primarily on the treatment of chronic pain using prescription medications.   Physical Medicine and Rehabilitation (PMR) physicians, also known as physiatrists - trained to treat a wide variety of medical conditions affecting the brain, spinal cord, nerves, bones, joints, ligaments, muscles, and tendons. Their training is primarily aimed at treating patients that have suffered injuries that have caused severe physical impairment. Their training is primarily aimed at the physical therapy and rehabilitation of those patients. They may also work alongside orthopedic surgeons or neurosurgeons using their expertise in assisting surgical patients to recover after their surgeries.  INTERVENTIONAL PAIN MANAGEMENT is sub-subspecialty of Pain Management.  Our physicians are Board-certified in Anesthesia, Pain Management, and Interventional Pain Management.  This meaning that not only have they been trained and Board-certified in their specialty of Anesthesia, and subspecialty of Pain Management, but they have also received further training in the sub-subspecialty of Interventional Pain Management, in order to become Board-certified as INTERVENTIONAL PAIN MANAGEMENT SPECIALIST.    Mission: Our goal is to use our skills in  INTERVENTIONAL PAIN MANAGEMENT as alternatives to the chronic use of prescription opioid medications for the treatment of pain. To make this more clear, we have changed our name to reflect what we do and offer. We will continue to offer medication management assessment and recommendations, but we will not be taking over any patient's medication management.  ____________________________________________________________________________________________     

## 2022-10-13 ENCOUNTER — Encounter: Payer: Self-pay | Admitting: Pain Medicine

## 2022-10-13 ENCOUNTER — Ambulatory Visit: Payer: No Typology Code available for payment source | Attending: Pain Medicine | Admitting: Pain Medicine

## 2022-10-13 VITALS — BP 117/72 | HR 58 | Temp 97.3°F | Ht 67.0 in | Wt 172.0 lb

## 2022-10-13 DIAGNOSIS — M79605 Pain in left leg: Secondary | ICD-10-CM | POA: Diagnosis not present

## 2022-10-13 DIAGNOSIS — G8929 Other chronic pain: Secondary | ICD-10-CM | POA: Diagnosis present

## 2022-10-13 DIAGNOSIS — F1190 Opioid use, unspecified, uncomplicated: Secondary | ICD-10-CM | POA: Diagnosis present

## 2022-10-13 DIAGNOSIS — M542 Cervicalgia: Secondary | ICD-10-CM | POA: Insufficient documentation

## 2022-10-13 DIAGNOSIS — M549 Dorsalgia, unspecified: Secondary | ICD-10-CM | POA: Insufficient documentation

## 2022-10-13 DIAGNOSIS — Z79899 Other long term (current) drug therapy: Secondary | ICD-10-CM | POA: Insufficient documentation

## 2022-10-13 DIAGNOSIS — M545 Low back pain, unspecified: Secondary | ICD-10-CM | POA: Insufficient documentation

## 2022-10-13 DIAGNOSIS — M47816 Spondylosis without myelopathy or radiculopathy, lumbar region: Secondary | ICD-10-CM | POA: Diagnosis present

## 2022-10-13 DIAGNOSIS — M25511 Pain in right shoulder: Secondary | ICD-10-CM | POA: Diagnosis present

## 2022-10-13 DIAGNOSIS — G894 Chronic pain syndrome: Secondary | ICD-10-CM | POA: Insufficient documentation

## 2022-10-13 DIAGNOSIS — M25512 Pain in left shoulder: Secondary | ICD-10-CM | POA: Insufficient documentation

## 2022-10-13 DIAGNOSIS — Z79891 Long term (current) use of opiate analgesic: Secondary | ICD-10-CM | POA: Insufficient documentation

## 2022-10-13 MED ORDER — HYDROCODONE-ACETAMINOPHEN 5-325 MG PO TABS: 1.0000 | ORAL_TABLET | Freq: Every day | ORAL | 0 refills | Status: DC | PRN

## 2022-10-13 MED ORDER — NALOXONE HCL 4 MG/0.1ML NA LIQD: 1.0000 | NASAL | 0 refills | Status: AC | PRN

## 2022-10-13 NOTE — Progress Notes (Signed)
Nursing Pain Medication Assessment:  Safety precautions to be maintained throughout the outpatient stay will include: orient to surroundings, keep bed in low position, maintain call bell within reach at all times, provide assistance with transfer out of bed and ambulation.  Medication Inspection Compliance: Pill count conducted under aseptic conditions, in front of the patient. Neither the pills nor the bottle was removed from the patient's sight at any time. Once count was completed pills were immediately returned to the patient in their original bottle.  Medication: Hydrocodone/APAP Pill/Patch Count:  27 of 30 pills remain Pill/Patch Appearance: Markings consistent with prescribed medication Bottle Appearance: Standard pharmacy container. Clearly labeled. Filled Date: 67 / 10 / 2023 Last Medication intake:  TodaySafety precautions to be maintained throughout the outpatient stay will include: orient to surroundings, keep bed in low position, maintain call bell within reach at all times, provide assistance with transfer out of bed and ambulation.

## 2022-10-13 NOTE — Patient Instructions (Signed)
____________________________________________________________________________________________  Patient Information update  To: All of our patients.  Re: Name change.  It has been made official that our current name, "[facility]"   will soon be changed to "[facility]".   The purpose of this change is to eliminate any confusion created by the concept of our practice being a "Medication Management Pain Clinic". In the past this has led to the misconception that we treat pain primarily by the use of prescription medications.  Nothing can be farther from the truth.   Understanding PAIN MANAGEMENT: To further understand what our practice does, you first have to understand that "Pain Management" is a subspecialty that requires additional training once a physician has completed their specialty training, which can be in either Anesthesia, Neurology, Psychiatry, or Physical Medicine and Rehabilitation (PMR). Each one of these contributes to the final approach taken by each physician to the management of their patient's pain. To be a "Pain Management Specialist" you must have first completed one of the specialty trainings below.  Anesthesiologists - trained in clinical pharmacology and interventional techniques such as nerve blockade and regional as well as central neuroanatomy. They are trained to block pain before, during, and after surgical interventions.  Neurologists - trained in the diagnosis and pharmacological treatment of complex neurological conditions, such as Multiple Sclerosis, Parkinson's, spinal cord injuries, and other systemic conditions that may be associated with symptoms that may include but are not limited to pain. They tend to rely primarily on the treatment of chronic pain using prescription medications.  Psychiatrist - trained in conditions affecting the psychosocial  wellbeing of patients including but not limited to depression, anxiety, schizophrenia, personality disorders, addiction, and other substance use disorders that may be associated with chronic pain. They tend to rely primarily on the treatment of chronic pain using prescription medications.   Physical Medicine and Rehabilitation (PMR) physicians, also known as physiatrists - trained to treat a wide variety of medical conditions affecting the brain, spinal cord, nerves, bones, joints, ligaments, muscles, and tendons. Their training is primarily aimed at treating patients that have suffered injuries that have caused severe physical impairment. Their training is primarily aimed at the physical therapy and rehabilitation of those patients. They may also work alongside orthopedic surgeons or neurosurgeons using their expertise in assisting surgical patients to recover after their surgeries.  INTERVENTIONAL PAIN MANAGEMENT is sub-subspecialty of Pain Management.  Our physicians are Board-certified in Anesthesia, Pain Management, and Interventional Pain Management.  This meaning that not only have they been trained and Board-certified in their specialty of Anesthesia, and subspecialty of Pain Management, but they have also received further training in the sub-subspecialty of Interventional Pain Management, in order to become Board-certified as INTERVENTIONAL PAIN MANAGEMENT SPECIALIST.    Mission: Our goal is to use our skills in  INTERVENTIONAL PAIN MANAGEMENT as alternatives to the chronic use of prescription opioid medications for the treatment of pain. To make this more clear, we have changed our name to reflect what we do and offer. We will continue to offer medication management assessment and recommendations, but we will not be taking over any patient's medication management.  ____________________________________________________________________________________________      ____________________________________________________________________________________________  National Pain Medication Shortage  The [place] is experiencing worsening drug shortages. These have had a negative widespread effect on patient care and treatment. Not expected to improve any time soon. Predicted to last past 2029.   Drug shortage list (generic   names) Oxycodone IR Oxycodone/APAP Oxymorphone IR Hydromorphone Hydrocodone/APAP Morphine  Where is the problem?  Manufacturing and supply level.  Will this shortage affect you?  Only if you take any of the above pain medications.  How? You may be unable to fill your prescription.  Your pharmacist may offer a "partial fill" of your prescription. (Warning: Do not accept partial fills.) Prescriptions partially filled cannot be transferred to another pharmacy. Read our Medication Rules and Regulation. Depending on how much medicine you are dependent on, you may experience withdrawals when unable to get the medication.  Recommendations: Consider ending your dependence on opioid pain medications. Ask your pain specialist to assist you with the process. Consider switching to a medication currently not in shortage, such as Buprenorphine. Talk to your pain specialist about this option. Consider decreasing your pain medication requirements by managing tolerance thru "Drug Holidays". This may help minimize withdrawals, should you run out of medicine. Control your pain thru the use of non-pharmacological interventional therapies.   Your prescriber: Prescribers cannot be blamed for shortages. Medication manufacturing and supply issues cannot be fixed by the prescriber.   NOTE: The prescriber is not responsible for supplying the medication, or solving supply issues. Work with your pharmacist to solve it. The patient is responsible for the decision to take or continue taking the medication and for identifying and securing a legal supply source. By  law, supplying the medication is the job and responsibility of the pharmacy. The prescriber is responsible for the evaluation, monitoring, and prescribing of these medications.   Prescribers will NOT: Re-issue prescriptions that have been partially filled. Re-issue prescriptions already sent to a pharmacy.  Re-send prescriptions to a different pharmacy because yours did not have your medication. Ask pharmacist to order more medicine or transfer the prescription to another pharmacy. (Read below.)  New 2023 regulation: "July 02, 2022 Revised Regulation Allows DEA-Registered Pharmacies to Transfer Electronic Prescriptions at a Patient's Request DEA Headquarters Division - Public Information Office Patients now have the ability to request their electronic prescription be transferred to another pharmacy without having to go back to their practitioner to initiate the request. This revised regulation went into effect on Monday, June 28, 2022.     At a patient's request, a DEA-registered retail pharmacy can now transfer an electronic prescription for a controlled substance (schedules II-V) to another DEA-registered retail pharmacy. Prior to this change, patients would have to go through their practitioner to cancel their prescription and have it re-issued to a different pharmacy. The process was taxing and time consuming for both patients and practitioners.    The Drug Enforcement Administration (DEA) published its intent to revise the process for transferring electronic prescriptions on September 19, 2020.  The final rule was published in the federal register on May 27, 2022 and went into effect 30 days later.  Under the final rule, a prescription can only be transferred once between pharmacies, and only if allowed under existing state or other applicable law. The prescription must remain in its electronic form; may not be altered in any way; and the transfer must be communicated directly between  two licensed pharmacists. It's important to note, any authorized refills transfer with the original prescription, which means the entire prescription will be filled at the same pharmacy".  Reference: https://www.dea.gov/stories/2023/2023-07/2022-09-01/revised-regulation-allows-dea-registered-pharmacies-transfer (DEA website announcement)  https://www.govinfo.gov/content/pkg/FR-2022-05-27/pdf/2023-15847.pdf (Federal Register  Department of Justice)   Federal Register / Vol. 88, No. 143 / Thursday, May 27, 2022 / Rules and Regulations DEPARTMENT OF JUSTICE  Drug Enforcement   Administration  21 CFR Part 1306  [Docket No. [id number]]  RIN 1117-AB64 Transfer of Electronic Prescriptions for Schedules II-V Controlled Substances Between Pharmacies for Initial Filling  ____________________________________________________________________________________________     ____________________________________________________________________________________________  Drug Holidays  What is a "Drug Holiday"? Drug Holiday: is the name given to the process of slowly tapering down and temporarily stopping the pain medication for the purpose of decreasing or eliminating tolerance to the drug.  Benefits Improved effectiveness Decreased required effective dose Improved pain control End dependence on high dose therapy Decrease cost of therapy Uncovering "opioid-induced hyperalgesia". (OIH)  What is "opioid hyperalgesia"? It is a paradoxical increase in pain caused by exposure to opioids. Stopping the opioid pain medication, contrary to the expected, it actually decreases or completely eliminates the pain. Ref.: "A comprehensive review of opioid-induced hyperalgesia". Marion Lee, et.al. Pain Physician. 2011 Mar-Apr;14(2):145-61.  What is tolerance? Tolerance: the progressive loss of effectiveness of a pain medicine due to repetitive use. A common problem of opioid pain medications.  How long should a "Drug  Holiday" last? Effectiveness depends on the patient staying off all opioid pain medicines for a minimum of 14 consecutive days. (2 weeks)  How about just taking less of the medicine? Does not work. Will not accomplish goal of eliminating the excess receptors.  How about switching to a different pain medicine? (AKA. "Opioid rotation") Does not work. Creates the illusion of effectiveness by taking advantage of inaccurate equivalent dose calculations between different opioids. -This "technique" was promoted by studies funded by pharmaceutical companies, such as [employer], creators of "OxyContin".  Can I stop the medicine "cold turkey"? Depends. You should always coordinate with your Pain Specialist to make the transition as smoothly as possible. Avoid stopping the medicine abruptly without consulting. We recommend a "slow taper".  What is a slow taper? Taper: refers to the gradual decrease in dose.   How do I stop/taper the dose? Slowly. Decrease the daily amount of pills that you take by one (1) pill every seven (7) days. This is called a "slow downward taper". Example: if you normally take four (4) pills per day, drop it to three (3) pills per day for seven (7) days, then to two (2) pills per day for seven (7) days, then to one (1) per day for seven (7) days, and then stop the medicine. The 14 day "Drug Holiday" starts on the first day without medicine.   Will I experience withdrawals? Unlikely with a slow taper.  What triggers withdrawals? Withdrawals are triggered by the sudden/abrupt stop of high dose opioids. Withdrawals can be avoided by slowly decreasing the dose over a prolonged period of time.  What are withdrawals? Symptoms associated with sudden/abrupt reduction/stopping of high-dose, long-term use of pain medication. Withdrawal are seldom seen on low dose therapy, or patients rarely taking opioid medication.  Early Withdrawal Symptoms may include: Agitation Anxiety Muscle  aches Increased tearing Insomnia Runny nose Sweating Yawning  Late symptoms may include: Abdominal cramping Diarrhea Dilated pupils Goose bumps Nausea Vomiting  (Last update: 10/10/2022) ____________________________________________________________________________________________    _______________________________________________________________________  Medication Rules  Purpose: To inform patients, and their family members, of our medication rules and regulations.  Applies to: All patients receiving prescriptions from our practice (written or electronic).  Pharmacy of record: This is the pharmacy where your electronic prescriptions will be sent. Make sure we have the correct one.  Electronic prescriptions: In compliance with the [place] Strengthen Opioid Misuse Prevention (STOP) Act of 2017 (Session Law 2017-74/H243), effective November 01, 2018, all controlled substances must be electronically prescribed. Written prescriptions, faxing, or calling prescriptions to a pharmacy will no longer be done.  Prescription refills: These will be provided only during in-person appointments. No medications will be renewed without a "face-to-face" evaluation with your provider. Applies to all prescriptions.  NOTE: The following applies primarily to controlled substances (Opioid* Pain Medications).   Type of encounter (visit): For patients receiving controlled substances, face-to-face visits are required. (Not an option and not up to the patient.)  Patient's responsibilities: Pain Pills: Bring all pain pills to every appointment (except for procedure appointments). Pill Bottles: Bring pills in original pharmacy bottle. Bring bottle, even if empty. Always bring the bottle of the most recent fill.  Medication refills: You are responsible for knowing and keeping track of what medications you are taking and when is it that you will need a refill. The day before your appointment: write a  list of all prescriptions that need to be refilled. The day of the appointment: give the list to the admitting nurse. Prescriptions will be written only during appointments. No prescriptions will be written on procedure days. If you forget a medication: it will not be "Called in", "Faxed", or "electronically sent". You will need to get another appointment to get these prescribed. No early refills. Do not call asking to have your prescription filled early. Partial  or short prescriptions: Occasionally your pharmacy may not have enough pills to fill your prescription.  NEVER ACCEPT a partial fill or a prescription that is short of the total amount of pills that you were prescribed.  With controlled substances the law allows 72 hours for the pharmacy to complete the prescription.  If the prescription is not completed within 72 hours, the pharmacist will require a new prescription to be written. This means that you will be short on your medicine and we WILL NOT send another prescription to complete your original prescription.  Instead, request the pharmacy to send a carrier to a nearby branch to get enough medication to provide you with your full prescription. Prescription Accuracy: You are responsible for carefully inspecting your prescriptions before leaving our office. Have the discharge nurse carefully go over each prescription with you, before taking them home. Make sure that your name is accurately spelled, that your address is correct. Check the name and dose of your medication to make sure it is accurate. Check the number of pills, and the written instructions to make sure they are clear and accurate. Make sure that you are given enough medication to last until your next medication refill appointment. Taking Medication: Take medication as prescribed. When it comes to controlled substances, taking less pills or less frequently than prescribed is permitted and encouraged. Never take more pills than  instructed. Never take the medication more frequently than prescribed.  Inform other Doctors: Always inform, all of your healthcare providers, of all the medications you take. Pain Medication from other Providers: You are not allowed to accept any additional pain medication from any other Doctor or Healthcare provider. There are two exceptions to this rule. (see below) In the event that you require additional pain medication, you are responsible for notifying us, as stated below. Cough Medicine: Often these contain an opioid, such as codeine or hydrocodone. Never accept or take cough medicine containing these opioids if you are already taking an opioid* medication. The combination may cause respiratory failure and death. Medication Agreement: You are responsible for carefully reading and following our Medication Agreement. This   must be signed before receiving any prescriptions from our practice. Safely store a copy of your signed Agreement. Violations to the Agreement will result in no further prescriptions. (Additional copies of our Medication Agreement are available upon request.) Laws, Rules, & Regulations: All patients are expected to follow all Federal and State Laws, Statutes, Rules, & Regulations. Ignorance of the Laws does not constitute a valid excuse.  Illegal drugs and Controlled Substances: The use of illegal substances (including, but not limited to marijuana and its derivatives) and/or the illegal use of any controlled substances is strictly prohibited. Violation of this rule may result in the immediate and permanent discontinuation of any and all prescriptions being written by our practice. The use of any illegal substances is prohibited. Adopted CDC guidelines & recommendations: Target dosing levels will be at or below 60 MME/day. Use of benzodiazepines** is not recommended.  Exceptions: There are only two exceptions to the rule of not receiving pain medications from other Healthcare  Providers. Exception #1 (Emergencies): In the event of an emergency (i.e.: accident requiring emergency care), you are allowed to receive additional pain medication. However, you are responsible for: As soon as you are able, call our office [phone], at any time of the day or night, and leave a message stating your name, the date and nature of the emergency, and the name and dose of the medication prescribed. In the event that your call is answered by a member of our staff, make sure to document and save the date, time, and the name of the person that took your information.  Exception #2 (Planned Surgery): In the event that you are scheduled by another doctor or dentist to have any type of surgery or procedure, you are allowed (for a period no longer than 30 days), to receive additional pain medication, for the acute post-op pain. However, in this case, you are responsible for picking up a copy of our "Post-op Pain Management for Surgeons" handout, and giving it to your surgeon or dentist. This document is available at our office, and does not require an appointment to obtain it. Simply go to our office during business hours (Monday-Thursday from 8:00 AM to 4:00 PM) (Friday 8:00 AM to 12:00 Noon) or if you have a scheduled appointment with us, prior to your surgery, and ask for it by name. In addition, you are responsible for: calling our office [phone], at any time of the day or night, and leaving a message stating your name, name of your surgeon, type of surgery, and date of procedure or surgery. Failure to comply with your responsibilities may result in termination of therapy involving the controlled substances. Medication Agreement Violation. Following the above rules, including your responsibilities will help you in avoiding a Medication Agreement Violation ("Breaking your Pain Medication Contract").  Consequences:  Not following the above rules may result in permanent discontinuation of  medication prescription therapy.  *Opioid medications include: morphine, codeine, oxycodone, oxymorphone, hydrocodone, hydromorphone, meperidine, tramadol, tapentadol, buprenorphine, fentanyl, methadone. **Benzodiazepine medications include: diazepam (Valium), alprazolam (Xanax), clonazepam (Klonopine), lorazepam (Ativan), clorazepate (Tranxene), chlordiazepoxide (Librium), estazolam (Prosom), oxazepam (Serax), temazepam (Restoril), triazolam (Halcion) (Last updated: 08/24/2022) ______________________________________________________________________    ______________________________________________________________________  Medication Recommendations and Reminders  Applies to: All patients receiving prescriptions (written and/or electronic).  Medication Rules & Regulations: You are responsible for reading, knowing, and following our "Medication Rules" document. These exist for your safety and that of others. They are not flexible and neither are we. Dismissing or ignoring them is an   act of "non-compliance" that may result in complete and irreversible termination of such medication therapy. For safety reasons, "non-compliance" will not be tolerated. As with the [place] fundamental legal principle of "ignorance of the law is no defense", we will accept no excuses for not having read and knowing the content of documents provided to you by our practice.  Pharmacy of record:  Definition: This is the pharmacy where your electronic prescriptions will be sent.  We do not endorse any particular pharmacy. It is up to you and your insurance to decide what pharmacy to use.  We do not restrict you in your choice of pharmacy. However, once we write for your prescriptions, we will NOT be re-sending more prescriptions to fix restricted supply problems created by your pharmacy, or your insurance.  The pharmacy listed in the electronic medical record should be the one where you want electronic prescriptions to be  sent. If you choose to change pharmacy, simply notify our nursing staff. Changes will be made only during your regular appointments and not over the phone.  Recommendations: Keep all of your pain medications in a safe place, under lock and key, even if you live alone. We will NOT replace lost, stolen, or damaged medication. We do not accept "Police Reports" as proof of medications having been stolen. After you fill your prescription, take 1 week's worth of pills and put them away in a safe place. You should keep a separate, properly labeled bottle for this purpose. The remainder should be kept in the original bottle. Use this as your primary supply, until it runs out. Once it's gone, then you know that you have 1 week's worth of medicine, and it is time to come in for a prescription refill. If you do this correctly, it is unlikely that you will ever run out of medicine. To make sure that the above recommendation works, it is very important that you make sure your medication refill appointments are scheduled at least 1 week before you run out of medicine. To do this in an effective manner, make sure that you do not leave the office without scheduling your next medication management appointment. Always ask the nursing staff to show you in your prescription , when your medication will be running out. Then arrange for the receptionist to get you a return appointment, at least 7 days before you run out of medicine. Do not wait until you have 1 or 2 pills left, to come in. This is very poor planning and does not take into consideration that we may need to cancel appointments due to bad weather, sickness, or emergencies affecting our staff. DO NOT ACCEPT A "Partial Fill": If for any reason your pharmacy does not have enough pills/tablets to completely fill or refill your prescription, do not allow for a "partial fill". The law allows the pharmacy to complete that prescription within 72 hours, without requiring a new  prescription. If they do not fill the rest of your prescription within those 72 hours, you will need a separate prescription to fill the remaining amount, which we will NOT provide. If the reason for the partial fill is your insurance, you will need to talk to the pharmacist about payment alternatives for the remaining tablets, but again, DO NOT ACCEPT A PARTIAL FILL, unless you can trust your pharmacist to obtain the remainder of the pills within 72 hours.  Prescription refills and/or changes in medication(s):  Prescription refills, and/or changes in dose or medication, will be conducted only   during scheduled medication management appointments. (Applies to both, written and electronic prescriptions.) No refills on procedure days. No medication will be changed or started on procedure days. No changes, adjustments, and/or refills will be conducted on a procedure day. Doing so will interfere with the diagnostic portion of the procedure. No phone refills. No medications will be "called into the pharmacy". No Fax refills. No weekend refills. No Holliday refills. No after hours refills.  Remember:  Business hours are:  Monday to Thursday 8:00 AM to 4:00 PM Provider's Schedule: Martyn Timme, MD - Appointments are:  Medication management: Monday and Wednesday 8:00 AM to 4:00 PM Procedure day: Tuesday and Thursday 7:30 AM to 4:00 PM Bilal Lateef, MD - Appointments are:  Medication management: Tuesday and Thursday 8:00 AM to 4:00 PM Procedure day: Monday and Wednesday 7:30 AM to 4:00 PM (Last update: 08/24/2022) ______________________________________________________________________    ____________________________________________________________________________________________  WARNING: CBD (cannabidiol) & Delta (Delta-8 tetrahydrocannabinol) products.   Applicable to:  All individuals currently taking or considering taking CBD (cannabidiol) and, more important, all patients taking opioid  analgesic controlled substances (pain medication). (Example: oxycodone; oxymorphone; hydrocodone; hydromorphone; morphine; methadone; tramadol; tapentadol; fentanyl; buprenorphine; butorphanol; dextromethorphan; meperidine; codeine; etc.)  Introduction:  Recently there has been a drive towards the use of "natural" products for the treatment of different conditions, including pain anxiety and sleep disorders. Marijuana and hemp are two varieties of the cannabis genus plants. Marijuana and its derivatives are illegal, while hemp and its derivatives are not. Cannabidiol (CBD) and tetrahydrocannabinol (THC), are two natural compounds found in plants of the Cannabis genus. They can both be extracted from hemp or marijuana. Both compounds interact with your body's endocannabinoid system in very different ways. CBD is associated with pain relief (analgesia) while THC is associated with the psychoactive effects ("the high") obtained from the use of marijuana products. There are two main types of THC: Delta-9, which comes from the marijuana plant and it is illegal, and Delta-8, which comes from the hemp plant, and it is legal. (Both, Delta-9-THC and Delta-8-THC are psychoactive and give you "the high".)   Legality:  Marijuana and its derivatives: illegal Hemp and its derivatives: Legal (State dependent) UPDATE: (12/18/2021) The Drug Enforcement Agency (DEA) issued a letter stating that "delta" cannabinoids, including Delta-8-THCO and Delta-9-THCO, synthetically derived from hemp do not qualify as hemp and will be viewed as Schedule I drugs. (Schedule I drugs, substances, or chemicals are defined as drugs with no currently accepted medical use and a high potential for abuse. Some examples of Schedule I drugs are: heroin, lysergic acid diethylamide (LSD), marijuana (cannabis), 3,4-methylenedioxymethamphetamine (ecstasy), methaqualone, and peyote.) (https://www.dea.gov)  Legal status of CBD in [place]:  "Conditionally  Legal"  Reference: "FDA Regulation of Cannabis and Cannabis-Derived Products, Including Cannabidiol (CBD)" - https://www.fda.gov/news-events/public-health-focus/fda-regulation-cannabis-and-cannabis-derived-products-including-cannabidiol-cbd  Warning:  CBD is not FDA approved and has not undergo the same manufacturing controls as prescription drugs.  This means that the purity and safety of available CBD may be questionable. Most of the time, despite manufacturer's claims, it is contaminated with THC (delta-9-tetrahydrocannabinol - the chemical in marijuana responsible for the "HIGH").  When this is the case, the THC contaminant will trigger a positive urine drug screen (UDS) test for Marijuana (carboxy-THC).   The FDA recently put out a warning about 5 things that everyone should be aware of regarding Delta-8 THC: Delta-8 THC products have not been evaluated or approved by the FDA for safe use and may be marketed in ways that put the public health at   risk. The FDA has received adverse event reports involving delta-8 THC-containing products. Delta-8 THC has psychoactive and intoxicating effects. Delta-8 THC manufacturing often involve use of potentially harmful chemicals to create the concentrations of delta-8 THC claimed in the marketplace. The final delta-8 THC product may have potentially harmful by-products (contaminants) due to the chemicals used in the process. Manufacturing of delta-8 THC products may occur in uncontrolled or unsanitary settings, which may lead to the presence of unsafe contaminants or other potentially harmful substances. Delta-8 THC products should be kept out of the reach of children and pets.  NOTE: Because a positive UDS for any illicit substance is a violation of our medication agreement, your opioid analgesics (pain medicine) may be permanently discontinued.  MORE ABOUT CBD  General Information: CBD was discovered in 1940 and it is a derivative of the cannabis sativa  genus plants (Marijuana and Hemp). It is one of the 113 identified substances found in Marijuana. It accounts for up to 40% of the plant's extract. As of 2018, preliminary clinical studies on CBD included research for the treatment of anxiety, movement disorders, and pain. CBD is available and consumed in multiple forms, including inhalation of smoke or vapor, as an aerosol spray, and by mouth. It may be supplied as an oil containing CBD, capsules, dried cannabis, or as a liquid solution. CBD is thought not to be as psychoactive as THC (delta-9-tetrahydrocannabinol - the chemical in marijuana responsible for the "HIGH"). Studies suggest that CBD may interact with different biological target receptors in the body, including cannabinoid and other neurotransmitter receptors. As of 2018 the mechanism of action for its biological effects has not been determined.  Side-effects  Adverse reactions: Dry mouth, diarrhea, decreased appetite, fatigue, drowsiness, malaise, weakness, sleep disturbances, and others.  Drug interactions:  CBD may interact with medications such as blood-thinners. CBD causes drowsiness on its own and it will increase drowsiness caused by other medications, including antihistamines (such as Benadryl), benzodiazepines (Xanax, Ativan, Valium), antipsychotics, antidepressants, opioids, alcohol and supplements such as kava, melatonin and St. John's Wort.  Other drug interactions: Brivaracetam (Briviact); Caffeine; Carbamazepine (Tegretol); Citalopram (Celexa); Clobazam (Onfi); Eslicarbazepine (Aptiom); Everolimus (Zostress); Lithium; Methadone (Dolophine); Rufinamide (Banzel); Sedative medications (CNS depressants); Sirolimus (Rapamune); Stiripentol (Diacomit); Tacrolimus (Prograf); Tamoxifen ; Soltamox); Topiramate (Topamax); Valproate; Warfarin (Coumadin); Zonisamide. (Last update: 10/11/2022) ____________________________________________________________________________________________    ____________________________________________________________________________________________  Naloxone Nasal Spray  Why am I receiving this medication? [place] STOP ACT requires that all patients taking high dose opioids or at risk of opioids respiratory depression, be prescribed an opioid reversal agent, such as Naloxone (AKA: Narcan).  What is this medication? NALOXONE (nal OX one) treats opioid overdose, which causes slow or shallow breathing, severe drowsiness, or trouble staying awake. Call emergency services after using this medication. You may need additional treatment. Naloxone works by reversing the effects of opioids. It belongs to a group of medications called opioid blockers.  COMMON BRAND NAME(S): Kloxxado, Narcan  What should I tell my care team before I take this medication? They need to know if you have any of these conditions: Heart disease Substance use disorder An unusual or allergic reaction to naloxone, other medications, foods, dyes, or preservatives Pregnant or trying to get pregnant Breast-feeding  When to use this medication? This medication is to be used for the treatment of respiratory depression (less than 8 breaths per minute) secondary to opioid overdose.   How to use this medication? This medication is for use in the nose. Lay the person on their   back. Support their neck with your hand and allow the head to tilt back before giving the medication. The nasal spray should be given into 1 nostril. After giving the medication, move the person onto their side. Do not remove or test the nasal spray until ready to use. Get emergency medical help right away after giving the first dose of this medication, even if the person wakes up. You should be familiar with how to recognize the signs and symptoms of a narcotic overdose. If more doses are needed, give the additional dose in the other nostril. Talk to your care team about the use of this medication in children.  While this medication may be prescribed for children as young as newborns for selected conditions, precautions do apply.  Naloxone Overdosage: If you think you have taken too much of this medicine contact a poison control center or emergency room at once.  NOTE: This medicine is only for you. Do not share this medicine with others.  What if I miss a dose? This does not apply.  What may interact with this medication? This is only used during an emergency. No interactions are expected during emergency use. This list may not describe all possible interactions. Give your health care provider a list of all the medicines, herbs, non-prescription drugs, or dietary supplements you use. Also tell them if you smoke, drink alcohol, or use illegal drugs. Some items may interact with your medicine.  What should I watch for while using this medication? Keep this medication ready for use in the case of an opioid overdose. Make sure that you have the phone number of your care team and local hospital ready. You may need to have additional doses of this medication. Each nasal spray contains a single dose. Some emergencies may require additional doses. After use, bring the treated person to the nearest hospital or call 911. Make sure the treating care team knows that the person has received a dose of this medication. You will receive additional instructions on what to do during and after use of this medication before an emergency occurs.  What side effects may I notice from receiving this medication? Side effects that you should report to your care team as soon as possible: Allergic reactions--skin rash, itching, hives, swelling of the face, lips, tongue, or throat Side effects that usually do not require medical attention (report these to your care team if they continue or are bothersome): Constipation Dryness or irritation inside the nose Headache Increase in blood pressure Muscle spasms Stuffy  nose Toothache This list may not describe all possible side effects. Call your doctor for medical advice about side effects. You may report side effects to FDA at 1-800-FDA-1088.  Where should I keep my medication? Because this is an emergency medication, you should keep it with you at all times.  Keep out of the reach of children and pets. Store between 20 and 25 degrees C (68 and 77 degrees F). Do not freeze. Throw away any unused medication after the expiration date. Keep in original box until ready to use.  NOTE: This sheet is a summary. It may not cover all possible information. If you have questions about this medicine, talk to your doctor, pharmacist, or health care provider.   2023 Elsevier/Gold Standard (2021-06-26 00:00:00)  ____________________________________________________________________________________________   

## 2022-10-13 NOTE — Progress Notes (Signed)
PROVIDER NOTE: Information contained herein reflects review and annotations entered in association with encounter. Interpretation of such information and data should be left to medically-trained personnel. Information provided to patient can be located elsewhere in the medical record under "Patient Instructions". Document created using STT-dictation technology, any transcriptional errors that may result from process are unintentional.    Patient: Jose Washington  Service Category: E/M  Provider: Gaspar Cola, MD  DOB: [birth year]  DOS: 10/13/2022  Referring Provider: Jon Billings, NP  MRN: [id number]  Specialty: Interventional Pain Management  PCP: Jon Billings, NP  Type: Established Patient  Setting: Ambulatory outpatient    Location: Office  Delivery: Face-to-face     HPI  Mr. KAILASH HINZE, a 56 y.o. year old male, is here today because of his Chronic pain syndrome [G89.4]. Mr. Lysaght primary complain today is Neck Pain (Right side worse) Last encounter: My last encounter with him was on 08/31/2022. Pertinent problems: Mr. Headley has Chronic neck pain (3ry area of Pain) (Bilateral); Chronic pain syndrome; Chronic low back pain (1ry area of Pain) (Bilateral) (L>R) w/o sciatica ; Chronic upper back pain (4th area of Pain) (Bilateral); Chronic shoulder pain (Bilateral) (L>R); Osteoarthritis of AC (acromioclavicular) joint (Right); Chronic sacroiliac joint pain (Bilateral) (L>R); Muscle spasticity; Cervical DDD (C4-5, C5-6, C6-7 and C7-T1); Cervical foraminal stenosis (Bilateral: C5-6 & C6-7, Left: C4-5 & C7-T1); Cervical radiculitis (Bilateral) (L>R); Cervical facet syndrome (Bilateral) (L>R); Cervical spondylosis; Musculoskeletal neck pain (trapezius); Chronic knee pain (Left); Chronic lower extremity pain (2ry area of Pain) (Left); Grade 1 Retrolisthesis of L3 over L4; Lumbar facet arthropathy (Bilateral); Lumbar facet osteoarthritis; Lumbar facet syndrome (Bilateral) (R>L);  Lumbar spondylosis; Chronic shoulder pain (Right); Suprascapular neuropathy (Right); DDD (degenerative disc disease), lumbosacral; Lumbar facet hypertrophy (Multilevel) (Bilateral); Spondylosis without myelopathy or radiculopathy, lumbosacral region; Inflammatory spondylopathy of lumbosacral region [facility]); Chronic lower extremity pain (Right); Chronic musculoskeletal pain; Abnormal MRI, lumbar spine (09/07/2017); Cervicalgia (Bilateral); Spondylosis without myelopathy or radiculopathy, cervical region; Myofascial pain syndrome of thoracic spine (serratus muscle) (Right); Chronic flank pain (Right); Abnormal result on screening urine test (03/27/20 & 08/20/20); Chronic hip pain (Bilateral); Chronic hip pain (Right); Enthesopathy of hip region (Right); Osteoarthritis of hip (Right); and Trigger point of shoulder region (Right) on their pertinent problem list. Pain Assessment: Severity of Chronic pain is reported as a 3 /10. Location: Neck Right/denies. Onset: More than a month ago. Quality: Aching, Constant, Dull. Timing: Constant. Modifying factor(s): rest, stretching, meds, massage. Vitals:  height is _0  (1.702 m) and weight is 172 lb (78 kg). His temporal temperature is 97.3 F (36.3 C) (abnormal). His blood pressure is 117/72 and his pulse is 58 (abnormal). His oxygen saturation is 100%.  BMI: Estimated body mass index is 26.94 kg/m as calculated from the following:   Height as of this encounter: _1  (1.702 m).   Weight as of this encounter: 172 lb (78 kg).  Reason for encounter: medication management.  The patient indicates doing well with the current medication regimen. No adverse reactions or side effects reported to the medications.   RTCB: 02/07/2023  Nonopioids transferred 08/20/2020: Baclofen  Pharmacotherapy Assessment  Analgesic: Hydrocodone/APAP 5/325, 1 tab PO QD (5 mg/day of hydrocodone) MME/day: 5 mg/day.   Monitoring: [place] PMP: PDMP reviewed during this encounter.        Pharmacotherapy: No side-effects or adverse reactions reported. Compliance: No problems identified. Effectiveness: Clinically acceptable.  Arlice Colt, RN  10/13/2022 10:36 AM  Sign when Signing Visit Nursing Pain Medication  Assessment:  Safety precautions to be maintained throughout the outpatient stay will include: orient to surroundings, keep bed in low position, maintain call bell within reach at all times, provide assistance with transfer out of bed and ambulation.  Medication Inspection Compliance: Pill count conducted under aseptic conditions, in front of the patient. Neither the pills nor the bottle was removed from the patient's sight at any time. Once count was completed pills were immediately returned to the patient in their original bottle.  Medication: Hydrocodone/APAP Pill/Patch Count:  27 of 30 pills remain Pill/Patch Appearance: Markings consistent with prescribed medication Bottle Appearance: Standard pharmacy container. Clearly labeled. Filled Date: 55 / 10 / 2023 Last Medication intake:  TodaySafety precautions to be maintained throughout the outpatient stay will include: orient to surroundings, keep bed in low position, maintain call bell within reach at all times, provide assistance with transfer out of bed and ambulation.     No results found for: "CBDTHCR" No results found for: "D8THCCBX" No results found for: "D9THCCBX"  UDS:  Summary  Date Value Ref Range Status  04/19/2022 Note  Final    Comment:    ==================================================================== [employer] [facility] 13 (MW) ==================================================================== Test                             Result       Flag       Units  Drug Present and Declared for Prescription Verification   Hydrocodone                    520          EXPECTED   ng/mg creat   Dihydrocodeine                 94           EXPECTED   ng/mg creat   Norhydrocodone                 307           EXPECTED   ng/mg creat    Sources of hydrocodone include scheduled prescription medications.    Dihydrocodeine and norhydrocodone are expected metabolites of    hydrocodone. Dihydrocodeine is also available as a scheduled    prescription medication.  ==================================================================== Test                      Result    Flag   Units      Ref Range   Creatinine              301              mg/dL      >=20 ==================================================================== Declared Medications:  The flagging and interpretation on this report are based on the  following declared medications.  Unexpected results may arise from  inaccuracies in the declared medications.   **Note: The testing scope of this panel includes these medications:   Hydrocodone (Norco)   **Note: The testing scope of this panel does not include the  following reported medications:   Acetaminophen (Tylenol)  Acetaminophen (Norco)  Atenolol (Tenormin)  Atorvastatin (Lipitor)  Baclofen (Lioresal)  Buspirone (Buspar)  Calcium  Cyanocobalamin  Desloratadine (Clarinex)  Diphenhydramine  Docusate (Colace)  Duloxetine (Cymbalta)  Fenofibrate  Fluticasone (Flonase)  Glucosamine  Hydroxyzine (Atarax)  Ibuprofen (Advil)  Levalbuterol (Xopenex)  Magnesium (Mag-Ox)  Melatonin  Methylsulfonylmethane  Multivitamin  Omeprazole (Prilosec)  Ondansetron (Zofran)  Potassium  Simethicone  Sucralfate (Carafate)  Supplement  Vitamin C  Vitamin D3  Vitamin E ==================================================================== For clinical consultation, please call [phone]. ====================================================================       ROS  Constitutional: Denies any fever or chills Gastrointestinal: No reported hemesis, hematochezia, vomiting, or acute GI distress Musculoskeletal: Denies any acute onset joint swelling, redness, loss of ROM, or  weakness Neurological: No reported episodes of acute onset apraxia, aphasia, dysarthria, agnosia, amnesia, paralysis, loss of coordination, or loss of consciousness  Medication Review  Apoaequorin, Calcium Carbonate, DULoxetine, Elderberry, Glucosamine-Chondroit-Vit C-Mn, HYDROcodone-acetaminophen, Methylsulfonylmethane, Potassium, Vitamin D3, acetaminophen, atenolol, atorvastatin, baclofen, busPIRone, cyanocobalamin, desloratadine-pseudoephedrine, diphenhydrAMINE HCl, docusate sodium, fenofibrate, fluticasone, hydrOXYzine, ibuprofen, levalbuterol, magnesium oxide, melatonin, multivitamin, naloxone, omeprazole, ondansetron, simethicone, sucralfate, and vitamin E  History Review  Allergy: Mr. Mcfadden has No Known Allergies. Drug: Mr. Muto  reports no history of drug use. Alcohol:  reports no history of alcohol use. Tobacco:  reports that he has never smoked. He has never used smokeless tobacco. Social: Mr. Stracener  reports that he has never smoked. He has never used smokeless tobacco. He reports that he does not drink alcohol and does not use drugs. Medical:  has a past medical history of Allergy, Anxiety, Chronic duodenal ulcer with hemorrhage (2012), Chronic neck pain, Depression, GERD (gastroesophageal reflux disease), Hyperlipidemia, Hypertension, and Microscopic hematuria. Surgical: Mr. Chhim  has a past surgical history that includes eye muscle repair (1972 and 1975); bLEEDING ULCER (2012); Flexible sigmoidoscopy; Colonoscopy with propofol (N/A, 03/24/2021); and Esophagogastroduodenoscopy (egd) with propofol (N/A, 03/24/2021). Family: family history includes Allergies in his brother; Cancer in his maternal grandfather and mother; Dementia in his father and paternal grandfather; Depression in his father; Diabetes in his mother; Mental illness in his father; Stroke in his paternal grandfather.  Laboratory Chemistry Profile   Renal Lab Results  Component Value Date   BUN 12 06/24/2022    CREATININE 1.05 06/24/2022   BCR 11 06/24/2022   GFRAA 84 12/24/2020   GFRNONAA >60 02/08/2021    Hepatic Lab Results  Component Value Date   AST 25 06/24/2022   ALT 23 06/24/2022   ALBUMIN 4.7 06/24/2022   ALKPHOS 58 06/24/2022   LIPASE 28 02/08/2021    Electrolytes Lab Results  Component Value Date   NA 141 06/24/2022   K 4.3 06/24/2022   CL 103 06/24/2022   CALCIUM 9.8 06/24/2022   MG 2.0 11/12/2016    Bone Lab Results  Component Value Date   VD25OH 35.3 12/25/2021   25OHVITD1 23 (L) 11/12/2016   25OHVITD2 <1.0 11/12/2016   25OHVITD3 23 11/12/2016    Inflammation (CRP: Acute Phase) (ESR: Chronic Phase) Lab Results  Component Value Date   CRP 1.4 (H) 11/12/2016   ESRSEDRATE 3 11/12/2016         Note: Above Lab results reviewed.  Recent Imaging Review  DG PAIN CLINIC C-ARM 1-60 MIN NO REPORT Fluoro was used, but no Radiologist interpretation will be provided.  Please refer to "NOTES" tab for provider progress note. Note: Reviewed        Physical Exam  General appearance: Well nourished, well developed, and well hydrated. In no apparent acute distress Mental status: Alert, oriented x 3 (person, place, & time)       Respiratory: No evidence of acute respiratory distress Eyes: PERLA Vitals: BP 117/72 (BP Location: Right Arm, Patient Position: Sitting, Cuff Size: Normal)   Pulse (!) 58   Temp (!) 97.3 F (36.3 C) (Temporal)   Ht  _0  (1.702 m)   Wt 172 lb (78 kg)   SpO2 100%   BMI 26.94 kg/m  BMI: Estimated body mass index is 26.94 kg/m as calculated from the following:   Height as of this encounter: _1  (1.702 m).   Weight as of this encounter: 172 lb (78 kg). Ideal: Ideal body weight: 66.1 kg (145 lb 11.6 oz) Adjusted ideal body weight: 70.9 kg (156 lb 3.8 oz)  Assessment   Diagnosis Status  1. Chronic pain syndrome   2. Chronic low back pain (1ry area of Pain) (Bilateral) (L>R) w/o sciatica    3. Chronic lower extremity pain (2ry area of Pain)  (Left)   4. Chronic neck pain (3ry area of Pain) (Bilateral) (L>R)   5. Chronic upper back pain (4th area of Pain) (Bilateral) (L>R)   6. Lumbar facet syndrome (Bilateral) (R>L)   7. Chronic shoulder pain (Bilateral) (L>R)   8. Pharmacologic therapy   9. Chronic use of opiate for therapeutic purpose   10. Opiate use (5 MME/Day)   11. Encounter for medication management   12. Encounter for chronic pain management    Controlled Controlled Controlled   Updated Problems: No problems updated.  Plan of Care  Problem-specific:  No problem-specific Assessment & Plan notes found for this encounter.  Mr. SHAYDEN BOBIER has a current medication list which includes the following long-term medication(s): atenolol, atorvastatin, baclofen, calcium carbonate, diphenhydramine hcl, duloxetine, fenofibrate, [START ON 11/09/2022] hydrocodone-acetaminophen, [START ON 12/09/2022] hydrocodone-acetaminophen, [START ON 01/08/2023] hydrocodone-acetaminophen, levalbuterol, omeprazole, potassium, simethicone, and sucralfate.  Pharmacotherapy (Medications Ordered): Meds ordered this encounter  Medications   HYDROcodone-acetaminophen (NORCO/VICODIN) 5-325 MG tablet    Sig: Take 1 tablet by mouth daily as needed for severe pain. Must last 30 days.    Dispense:  30 tablet    Refill:  0    DO NOT: delete (not duplicate); no partial-fill (will deny script to complete), no refill request (F/U required). DISPENSE: 1 day early if closed on fill date. WARN: No CNS-depressants within 8 hrs of med.   HYDROcodone-acetaminophen (NORCO/VICODIN) 5-325 MG tablet    Sig: Take 1 tablet by mouth daily as needed for severe pain. Must last 30 days.    Dispense:  30 tablet    Refill:  0    DO NOT: delete (not duplicate); no partial-fill (will deny script to complete), no refill request (F/U required). DISPENSE: 1 day early if closed on fill date. WARN: No CNS-depressants within 8 hrs of med.   HYDROcodone-acetaminophen  (NORCO/VICODIN) 5-325 MG tablet    Sig: Take 1 tablet by mouth daily as needed for severe pain. Must last 30 days.    Dispense:  30 tablet    Refill:  0    DO NOT: delete (not duplicate); no partial-fill (will deny script to complete), no refill request (F/U required). DISPENSE: 1 day early if closed on fill date. WARN: No CNS-depressants within 8 hrs of med.   naloxone (NARCAN) nasal spray 4 mg/0.1 mL    Sig: Place 1 spray into the nose as needed for up to 365 doses (for opioid-induced respiratory depresssion). In case of emergency (overdose), spray once into each nostril. If no response within 3 minutes, repeat application and call [phone].    Dispense:  1 each    Refill:  0    Instruct patient in proper use of device.   Orders:  No orders of the defined types were placed in this encounter.  Follow-up plan:  Return in about 4 months (around 02/07/2023) for Eval-day (M,W), (F2F), (MM).     Interventional Therapies  Risk  Complexity Considerations:   Estimated body mass index is 28.35 kg/m as calculated from the following:   Height as of this encounter: _0  (1.702 m).   Weight as of this encounter: 181 lb (82.1 kg). Abnormal UDS (03/27/2020) & (08/20/20) (+) undisclosed carboxy-THC    Planned  Pending:   Therapeutic right cervical facet RFA #1    Under consideration:   Therapeutic right IA hip joint injection #2  Therapeutic right lumbar facet RFA #2  Therapeutic right cervical facet RFA #1    Completed:   Diagnostic/therapeutic right IA hip inj. x1 (12/09/2020) (100/100/95/90-95)  Therapeutic right lumbar facet RFA x2 (11/26/2021) (100/100/75/75)  Palliative right lumbar facet MBB x2 (06/07/2019) (100/100/100 x20 days/>75 x3 months)  Diagnostic/therapeutic left lumbar facet MBB x1 (06/07/2019) (100/100/100 x20 days/>75 x3 months)  Diagnostic/therapeutic left L4-5 LESI x2 (07/25/2018) (n/a)  Palliative left CESI x3 (02/16/2018) (100/100/100/100)  Diagnostic right cervical facet MBB  x2 (08/19/2022) (100/100/100/100) (local anesthetic only diagnostic injections)  Diagnostic left cervical facet MBB x1 (01/10/2020) (100/100/100)  Diagnostic/therapeutic right shoulder region TPI/MNB x2 (07/19/2022) (100/100/100/100)    Therapeutic  Palliative (PRN) options:   Therapeutic lumbar facet RFA   Therapeutic/palliative lumbar facet MBB   Therapeutic L4-5 LESI   Therapeutic CESI   Therapeutic right IA hip joint injection #2  Diagnostic cervical facet MBB #2      Recent Visits Date Type Provider Dept  08/31/22 Office Visit Milinda Pointer, MD [facility]  08/19/22 Procedure visit Milinda Pointer, MD [facility]  08/03/22 Office Visit Milinda Pointer, MD [facility]  07/19/22 Office Visit Milinda Pointer, MD [facility]  Showing recent visits within past 90 days and meeting all other requirements Today's Visits Date Type Provider Dept  10/13/22 Office Visit Milinda Pointer, MD [facility]  Showing today's visits and meeting all other requirements Future Appointments Date Type Provider Dept  11/11/22 Appointment Milinda Pointer, MD [facility]  Showing future appointments within next 90 days and meeting all other requirements  I discussed the assessment and treatment plan with the patient. The patient was provided an opportunity to ask questions and all were answered. The patient agreed with the plan and demonstrated an understanding of the instructions.  Patient advised to call back or seek an in-person evaluation if the symptoms or condition worsens.  Duration of encounter: 30 minutes.  Total time on encounter, as per AMA guidelines included both the face-to-face and non-face-to-face time personally spent by the physician and/or other qualified health care professional(s) on the day of the encounter (includes time in activities that require the physician or other qualified health care professional  and does not include time in activities normally performed by clinical staff). Physician's time may include the following activities when performed: preparing to see the patient (eg, review of tests, pre-charting review of records) obtaining and/or reviewing separately obtained history performing a medically appropriate examination and/or evaluation counseling and educating the patient/family/caregiver ordering medications, tests, or procedures referring and communicating with other health care professionals (when not separately reported) documenting clinical information in the electronic or other health record independently interpreting results (not separately reported) and communicating results to the patient/ family/caregiver care coordination (not separately reported)  Note by: Gaspar Cola, MD Date: 10/13/2022; Time: 2:30 PM

## 2022-11-11 ENCOUNTER — Ambulatory Visit: Payer: No Typology Code available for payment source | Admitting: Pain Medicine

## 2022-11-18 ENCOUNTER — Other Ambulatory Visit: Payer: Self-pay | Admitting: Nurse Practitioner

## 2022-11-18 DIAGNOSIS — M62838 Other muscle spasm: Secondary | ICD-10-CM

## 2022-11-18 DIAGNOSIS — G8929 Other chronic pain: Secondary | ICD-10-CM

## 2022-11-18 NOTE — Telephone Encounter (Signed)
Requested Prescriptions  Pending Prescriptions Disp Refills   baclofen (LIORESAL) 10 MG tablet [Pharmacy Med Name: BACLOFEN 10 MG TABLET] 90 tablet 0    Sig: TAKE 1 TABLET BY MOUTH EVERYDAY AT BEDTIME     Analgesics:  Muscle Relaxants - baclofen Passed - 11/18/2022 12:22 PM      Passed - Cr in normal range and within 180 days    Creatinine, Ser  Date Value Ref Range Status  06/24/2022 1.05 0.76 - 1.27 mg/dL Final         Passed - eGFR is 30 or above and within 180 days    GFR calc Af Amer  Date Value Ref Range Status  12/24/2020 84 >59 mL/min/1.73 Final    Comment:    **In accordance with recommendations from the NKF-ASN Task force,**   [facility] is in the process of updating its eGFR calculation to the   2021 CKD-EPI creatinine equation that estimates kidney function   without a race variable.    GFR, Estimated  Date Value Ref Range Status  02/08/2021 >60 >60 mL/min Final    Comment:    (NOTE) Calculated using the CKD-EPI Creatinine Equation (2021)    eGFR  Date Value Ref Range Status  06/24/2022 83 >59 mL/min/1.73 Final         Passed - Valid encounter within last 6 months    Recent Outpatient Visits           4 months ago Aortic atherosclerosis ([place])   [facility] Jon Billings, NP   10 months ago Annual physical exam   [facility] Jon Billings, NP   1 year ago Upper respiratory tract infection, unspecified type   [place], Lauren A, NP   1 year ago Aortic atherosclerosis ([place])   [facility] Jon Billings, NP   1 year ago Benign essential hypertension   [place], Jolene T, NP       Future Appointments             In 1 month Jon Billings, NP [facility], [place]

## 2022-11-29 ENCOUNTER — Other Ambulatory Visit: Payer: Self-pay | Admitting: Nurse Practitioner

## 2022-11-30 NOTE — Telephone Encounter (Signed)
Please find out if patient needs this medication or if he still has some.  If he does need it can you ask him why he is taking it twice daily?

## 2022-11-30 NOTE — Telephone Encounter (Signed)
Requested medication (s) are due for refill today: yes  Requested medication (s) are on the active medication list: yes  Last refill:  06/14/22  Future visit scheduled: yes  Notes to clinic:  Unable to refill per protocol, cannot delegate.      Requested Prescriptions  Pending Prescriptions Disp Refills   ondansetron (ZOFRAN) 8 MG tablet [Pharmacy Med Name: ONDANSETRON HCL 8 MG TABLET] 180 tablet 1    Sig: TAKE 1 TABLET (8 MG TOTAL) BY MOUTH 2 (TWO) TIMES DAILY.     Not Delegated - Gastroenterology: Antiemetics - ondansetron Failed - 11/29/2022 12:09 PM      Failed - This refill cannot be delegated      Passed - AST in normal range and within 360 days    AST  Date Value Ref Range Status  06/24/2022 25 0 - 40 IU/L Final         Passed - ALT in normal range and within 360 days    ALT  Date Value Ref Range Status  06/24/2022 23 0 - 44 IU/L Final         Passed - Valid encounter within last 6 months    Recent Outpatient Visits           5 months ago Aortic atherosclerosis ([place])   [place] Jon Billings, NP   11 months ago Annual physical exam   [place] Jon Billings, NP   1 year ago Upper respiratory tract infection, unspecified type   [place], Lauren A, NP   1 year ago Aortic atherosclerosis [facility])   [place] Jon Billings, NP   1 year ago Benign essential hypertension   [place] Venita Lick, NP       Future Appointments             In 2 months Jon Billings, NP [place], [place]

## 2022-12-01 NOTE — Telephone Encounter (Signed)
Called and LVM asking for patient to please return my call.  

## 2022-12-02 NOTE — Telephone Encounter (Signed)
Called and spoke to patient. He states he is taking the Zofran twice daily because of his gastritis. He states that he gets flare ups and taking the zofran helps keep it under control.

## 2022-12-21 ENCOUNTER — Other Ambulatory Visit: Payer: Self-pay | Admitting: Nurse Practitioner

## 2022-12-22 NOTE — Telephone Encounter (Signed)
Requested Prescriptions  Pending Prescriptions Disp Refills   sucralfate (CARAFATE) 1 g tablet [Pharmacy Med Name: SUCRALFATE 1 GM TABLET] 90 tablet 0    Sig: TAKE 1 TABLET BY MOUTH DAILY.     Gastroenterology: Antiacids Passed - 12/21/2022  3:00 AM      Passed - Valid encounter within last 12 months    Recent Outpatient Visits           6 months ago Aortic atherosclerosis ([place])   [place] Jon Billings, NP   12 months ago Annual physical exam   [place] Jon Billings, NP   1 year ago Upper respiratory tract infection, unspecified type   [place] McElwee, Lauren A, NP   1 year ago Aortic atherosclerosis ([place])   [place] Jon Billings, NP   1 year ago Benign essential hypertension   [place], Henrine Screws T, NP       Future Appointments             In 2 months Jon Billings, NP [place], [facility]             fenofibrate 160 MG tablet [Pharmacy Med Name: FENOFIBRATE 160 MG TABLET] 90 tablet     Sig: TAKE 1 TABLET BY MOUTH EVERY DAY     Cardiovascular:  Antilipid - Fibric Acid Derivatives Failed - 12/21/2022  3:00 AM      Failed - HGB in normal range and within 360 days    Hemoglobin  Date Value Ref Range Status  12/25/2021 14.9 13.0 - 17.7 g/dL Final         Failed - HCT in normal range and within 360 days    Hematocrit  Date Value Ref Range Status  12/25/2021 43.4 37.5 - 51.0 % Final         Failed - PLT in normal range and within 360 days    Platelets  Date Value Ref Range Status  12/25/2021 336 150 - 450 x10E3/uL Final         Failed - WBC in normal range and within 360 days    WBC  Date Value Ref Range Status  12/25/2021 6.0 3.4 - 10.8 x10E3/uL Final  02/08/2021 8.9 4.0 - 10.5 K/uL Final         Failed - Lipid Panel in normal range within the last 12 months    Cholesterol,  Total  Date Value Ref Range Status  06/24/2022 147 100 - 199 mg/dL Final   Cholesterol Piccolo, Waived  Date Value Ref Range Status  07/30/2015 169 <200 mg/dL Final    Comment:                            Desirable                <200                         Borderline High      200- 239                         High                     >239    LDL Chol Calc (NIH)  Date Value Ref Range Status  06/24/2022 86 0 - 99 mg/dL Final   HDL  Date Value Ref Range Status  06/24/2022 41 >39 mg/dL Final   Triglycerides  Date Value Ref Range Status  06/24/2022 106 0 - 149 mg/dL Final   Triglycerides Piccolo,Waived  Date Value Ref Range Status  07/30/2015 288 (H) <150 mg/dL Final    Comment:                            Normal                   <150                         Borderline High     150 - 199                         High                200 - 499                         Very High                >499          Passed - ALT in normal range and within 360 days    ALT  Date Value Ref Range Status  06/24/2022 23 0 - 44 IU/L Final         Passed - AST in normal range and within 360 days    AST  Date Value Ref Range Status  06/24/2022 25 0 - 40 IU/L Final         Passed - Cr in normal range and within 360 days    Creatinine, Ser  Date Value Ref Range Status  06/24/2022 1.05 0.76 - 1.27 mg/dL Final         Passed - eGFR is 30 or above and within 360 days    GFR calc Af Amer  Date Value Ref Range Status  12/24/2020 84 >59 mL/min/1.73 Final    Comment:    **In accordance with recommendations from the NKF-ASN Task force,**   [facility] is in the process of updating its eGFR calculation to the   2021 CKD-EPI creatinine equation that estimates kidney function   without a race variable.    GFR, Estimated  Date Value Ref Range Status  02/08/2021 >60 >60 mL/min Final    Comment:    (NOTE) Calculated using the CKD-EPI Creatinine Equation (2021)    eGFR  Date Value Ref  Range Status  06/24/2022 83 >59 mL/min/1.73 Final         Passed - Valid encounter within last 12 months    Recent Outpatient Visits           6 months ago Aortic atherosclerosis [facility])   [place] Jon Billings, NP   12 months ago Annual physical exam   [place] Jon Billings, NP   1 year ago Upper respiratory tract infection, unspecified type   [place], NP   1 year ago Aortic atherosclerosis [facility])   [place] Jon Billings, NP   1 year ago Benign essential hypertension   [place], Barbaraann Faster, NP       Future  Appointments             In 2 months Jon Billings, NP [place], [facility]             omeprazole (PRILOSEC) 20 MG capsule [Pharmacy Med Name: OMEPRAZOLE DR 20 MG CAPSULE] 90 capsule 0    Sig: TAKE 1 CAPSULE (20 MG TOTAL) BY MOUTH DAILY. SCHEDULE OFFICE VISIT     Gastroenterology: Proton Pump Inhibitors Passed - 12/21/2022  3:00 AM      Passed - Valid encounter within last 12 months    Recent Outpatient Visits           6 months ago Aortic atherosclerosis ([place])   [place] Jon Billings, NP   12 months ago Annual physical exam   [place] Jon Billings, NP   1 year ago Upper respiratory tract infection, unspecified type   [place] McElwee, Scheryl Darter, NP   1 year ago Aortic atherosclerosis [facility])   [place] Jon Billings, NP   1 year ago Benign essential hypertension   [place], Henrine Screws T, NP       Future Appointments             In 2 months Jon Billings, NP [place], [facility]             atenolol (TENORMIN) 50 MG tablet [Pharmacy Med Name: ATENOLOL 50 MG TABLET] 90 tablet 0     Sig: TAKE 1 TABLET BY MOUTH EVERY DAY     Cardiovascular: Beta Blockers 2 Passed - 12/21/2022  3:00 AM      Passed - Cr in normal range and within 360 days    Creatinine, Ser  Date Value Ref Range Status  06/24/2022 1.05 0.76 - 1.27 mg/dL Final         Passed - Last BP in normal range    BP Readings from Last 1 Encounters:  10/13/22 117/72         Passed - Last Heart Rate in normal range    Pulse Readings from Last 1 Encounters:  10/13/22 (!) 18         Passed - Valid encounter within last 6 months    Recent Outpatient Visits           6 months ago Aortic atherosclerosis ([place])   [place] Jon Billings, NP   12 months ago Annual physical exam   [place] Jon Billings, NP   1 year ago Upper respiratory tract infection, unspecified type   [place] McElwee, Lauren A, NP   1 year ago Aortic atherosclerosis [facility])   [place] Jon Billings, NP   1 year ago Benign essential hypertension   [place] Venita Lick, NP       Future Appointments             In 2 months Jon Billings, NP [place], [facility]

## 2022-12-22 NOTE — Telephone Encounter (Signed)
Requested medication (s) are due for refill today: yes  Requested medication (s) are on the active medication list: yes  Last refill:  06/24/22 #90 1 RF  Future visit scheduled: yes  Notes to clinic:  overdue lab work   Requested Prescriptions  Pending Prescriptions Disp Refills   fenofibrate 160 MG tablet [Pharmacy Med Name: FENOFIBRATE 160 MG TABLET] 90 tablet     Sig: TAKE 1 TABLET BY MOUTH EVERY DAY     Cardiovascular:  Antilipid - Fibric Acid Derivatives Failed - 12/21/2022  3:00 AM      Failed - HGB in normal range and within 360 days    Hemoglobin  Date Value Ref Range Status  12/25/2021 14.9 13.0 - 17.7 g/dL Final         Failed - HCT in normal range and within 360 days    Hematocrit  Date Value Ref Range Status  12/25/2021 43.4 37.5 - 51.0 % Final         Failed - PLT in normal range and within 360 days    Platelets  Date Value Ref Range Status  12/25/2021 336 150 - 450 x10E3/uL Final         Failed - WBC in normal range and within 360 days    WBC  Date Value Ref Range Status  12/25/2021 6.0 3.4 - 10.8 x10E3/uL Final  02/08/2021 8.9 4.0 - 10.5 K/uL Final         Failed - Lipid Panel in normal range within the last 12 months    Cholesterol, Total  Date Value Ref Range Status  06/24/2022 147 100 - 199 mg/dL Final   Cholesterol Piccolo, Waived  Date Value Ref Range Status  07/30/2015 169 <200 mg/dL Final    Comment:                            Desirable                <200                         Borderline High      200- 239                         High                     >239    LDL Chol Calc (NIH)  Date Value Ref Range Status  06/24/2022 86 0 - 99 mg/dL Final   HDL  Date Value Ref Range Status  06/24/2022 41 >39 mg/dL Final   Triglycerides  Date Value Ref Range Status  06/24/2022 106 0 - 149 mg/dL Final   Triglycerides Piccolo,Waived  Date Value Ref Range Status  07/30/2015 288 (H) <150 mg/dL Final    Comment:                             Normal                   <150                         Borderline High     150 - 199  High                200 - 499                         Very High                >499          Passed - ALT in normal range and within 360 days    ALT  Date Value Ref Range Status  06/24/2022 23 0 - 44 IU/L Final         Passed - AST in normal range and within 360 days    AST  Date Value Ref Range Status  06/24/2022 25 0 - 40 IU/L Final         Passed - Cr in normal range and within 360 days    Creatinine, Ser  Date Value Ref Range Status  06/24/2022 1.05 0.76 - 1.27 mg/dL Final         Passed - eGFR is 30 or above and within 360 days    GFR calc Af Amer  Date Value Ref Range Status  12/24/2020 84 >59 mL/min/1.73 Final    Comment:    **In accordance with recommendations from the NKF-ASN Task force,**   [facility] is in the process of updating its eGFR calculation to the   2021 CKD-EPI creatinine equation that estimates kidney function   without a race variable.    GFR, Estimated  Date Value Ref Range Status  02/08/2021 >60 >60 mL/min Final    Comment:    (NOTE) Calculated using the CKD-EPI Creatinine Equation (2021)    eGFR  Date Value Ref Range Status  06/24/2022 83 >59 mL/min/1.73 Final         Passed - Valid encounter within last 12 months    Recent Outpatient Visits           6 months ago Aortic atherosclerosis ([place])   [place] Jon Billings, NP   12 months ago Annual physical exam   [place] Jon Billings, NP   1 year ago Upper respiratory tract infection, unspecified type   [place] McElwee, Scheryl Darter, NP   1 year ago Aortic atherosclerosis [facility]   [place], Santiago Glad, NP   1 year ago Benign essential hypertension   [place], Henrine Screws T, NP       Future Appointments             In 2  months Jon Billings, NP [place], [facility]             atenolol (TENORMIN) 50 MG tablet [Pharmacy Med Name: ATENOLOL 50 MG TABLET] 90 tablet 0    Sig: TAKE 1 TABLET BY MOUTH EVERY DAY     Cardiovascular: Beta Blockers 2 Passed - 12/21/2022  3:00 AM      Passed - Cr in normal range and within 360 days    Creatinine, Ser  Date Value Ref Range Status  06/24/2022 1.05 0.76 - 1.27 mg/dL Final         Passed - Last BP in normal range    BP Readings from Last 1 Encounters:  10/13/22 117/72         Passed - Last Heart Rate in normal range    Pulse Readings from Last 1 Encounters:  10/13/22 (!) 55         Passed - Valid encounter within last 6 months    Recent Outpatient Visits           6 months ago Aortic atherosclerosis ([place])   [place] Jon Billings, NP   12 months ago Annual physical exam   [place] Jon Billings, NP   1 year ago Upper respiratory tract infection, unspecified type   [place] McElwee, Lauren A, NP   1 year ago Aortic atherosclerosis [facility])   [place] Jon Billings, NP   1 year ago Benign essential hypertension   [place] Venita Lick, NP       Future Appointments             In 2 months Jon Billings, NP [place], [facility]            Signed Prescriptions Disp Refills   sucralfate (CARAFATE) 1 g tablet 90 tablet 0    Sig: TAKE 1 TABLET BY MOUTH DAILY.     Gastroenterology: Antiacids Passed - 12/21/2022  3:00 AM      Passed - Valid encounter within last 12 months    Recent Outpatient Visits           6 months ago Aortic atherosclerosis [facility])   [place] Jon Billings, NP   12 months ago Annual physical exam   [place] Jon Billings, NP   1 year ago Upper respiratory tract  infection, unspecified type   [place] McElwee, Lauren A, NP   1 year ago Aortic atherosclerosis [facility])   [place] Jon Billings, NP   1 year ago Benign essential hypertension   [place] Venita Lick, NP       Future Appointments             In 2 months Jon Billings, NP [place], [facility]             omeprazole (PRILOSEC) 20 MG capsule 90 capsule 0    Sig: TAKE 1 CAPSULE (20 MG TOTAL) BY MOUTH DAILY. SCHEDULE OFFICE VISIT     Gastroenterology: Proton Pump Inhibitors Passed - 12/21/2022  3:00 AM      Passed - Valid encounter within last 12 months    Recent Outpatient Visits           6 months ago Aortic atherosclerosis [facility])   [place] Jon Billings, NP   12 months ago Annual physical exam   [place] Jon Billings, NP   1 year ago Upper respiratory tract infection, unspecified type   [place], Scheryl Darter, NP   1 year ago Aortic atherosclerosis [facility])   [place] Jon Billings, NP   1 year ago Benign essential hypertension   [place] Venita Lick, NP       Future Appointments             In 2 months Jon Billings, NP [place], [place]

## 2022-12-27 ENCOUNTER — Encounter: Payer: No Typology Code available for payment source | Admitting: Nurse Practitioner

## 2023-01-01 ENCOUNTER — Other Ambulatory Visit: Payer: Self-pay | Admitting: Nurse Practitioner

## 2023-01-03 NOTE — Telephone Encounter (Signed)
Requested Prescriptions  Pending Prescriptions Disp Refills   atorvastatin (LIPITOR) 40 MG tablet [Pharmacy Med Name: ATORVASTATIN 40 MG TABLET] 90 tablet 1    Sig: TAKE 1 TABLET BY MOUTH EVERY DAY     Cardiovascular:  Antilipid - Statins Failed - 01/01/2023  8:49 AM      Failed - Lipid Panel in normal range within the last 12 months    Cholesterol, Total  Date Value Ref Range Status  06/24/2022 147 100 - 199 mg/dL Final   Cholesterol Piccolo, Waived  Date Value Ref Range Status  07/30/2015 169 <200 mg/dL Final    Comment:                            Desirable                <200                         Borderline High      200- 239                         High                     >239    LDL Chol Calc (NIH)  Date Value Ref Range Status  06/24/2022 86 0 - 99 mg/dL Final   HDL  Date Value Ref Range Status  06/24/2022 41 >39 mg/dL Final   Triglycerides  Date Value Ref Range Status  06/24/2022 106 0 - 149 mg/dL Final   Triglycerides Piccolo,Waived  Date Value Ref Range Status  07/30/2015 288 (H) <150 mg/dL Final    Comment:                            Normal                   <150                         Borderline High     150 - 199                         High                200 - 499                         Very High                >499          Passed - Patient is not pregnant      Passed - Valid encounter within last 12 months    Recent Outpatient Visits           6 months ago Aortic atherosclerosis ([place])   [place] Jon Billings, NP   1 year ago Annual physical exam   [place] Jon Billings, NP   1 year ago Upper respiratory tract infection, unspecified type   [place] McElwee, Scheryl Darter, NP   1 year ago Aortic atherosclerosis [facility])   [place], NP   2 years ago Benign essential hypertension   [place]  [facility] Venita Lick, NP       Future Appointments             In 1 month Jon Billings, NP [place]

## 2023-01-26 ENCOUNTER — Ambulatory Visit: Payer: No Typology Code available for payment source | Admitting: Podiatry

## 2023-01-31 NOTE — Progress Notes (Unsigned)
PROVIDER NOTE: Information contained herein reflects review and annotations entered in association with encounter. Interpretation of such information and data should be left to medically-trained personnel. Information provided to patient can be located elsewhere in the medical record under "Patient Instructions". Document created using STT-dictation technology, any transcriptional errors that may result from process are unintentional.    Patient: Jose Washington  Service Category: E/M  Provider: Gaspar Cola, MD  DOB: June 11, 1966  DOS: 02/02/2023  Referring Provider: Jon Billings, NP  MRN: [id number]  Specialty: Interventional Pain Management  PCP: Jon Billings, NP  Type: Established Patient  Setting: Ambulatory outpatient    Location: Office  Delivery: Face-to-face     HPI  Jose Washington, a 57 y.o. year old male, is here today because of his No primary diagnosis found.. Jose Washington primary complain today is No chief complaint on file.  Pertinent problems: Jose Washington has Chronic neck pain (3ry area of Pain) (Bilateral); Chronic pain syndrome; Chronic low back pain (1ry area of Pain) (Bilateral) (L>R) w/o sciatica ; Chronic upper back pain (4th area of Pain) (Bilateral); Chronic shoulder pain (Bilateral) (L>R); Osteoarthritis of AC (acromioclavicular) joint (Right); Chronic sacroiliac joint pain (Bilateral) (L>R); Muscle spasticity; Cervical DDD (C4-5, C5-6, C6-7 and C7-T1); Cervical foraminal stenosis (Bilateral: C5-6 & C6-7, Left: C4-5 & C7-T1); Cervical radiculitis (Bilateral) (L>R); Cervical facet syndrome (Bilateral) (L>R); Cervical spondylosis; Musculoskeletal neck pain (trapezius); Chronic knee pain (Left); Chronic lower extremity pain (2ry area of Pain) (Left); Grade 1 Retrolisthesis of L3 over L4; Lumbar facet arthropathy (Bilateral); Lumbar facet osteoarthritis; Lumbar facet syndrome (Bilateral) (R>L); Lumbar spondylosis; Chronic shoulder pain (Right); Suprascapular  neuropathy (Right); DDD (degenerative disc disease), lumbosacral; Lumbar facet hypertrophy (Multilevel) (Bilateral); Spondylosis without myelopathy or radiculopathy, lumbosacral region; Inflammatory spondylopathy of lumbosacral region; Chronic lower extremity pain (Right); Chronic musculoskeletal pain; Abnormal MRI, lumbar spine (09/07/2017); Cervicalgia (Bilateral); Spondylosis without myelopathy or radiculopathy, cervical region; Myofascial pain syndrome of thoracic spine (serratus muscle) (Right); Chronic flank pain (Right); Abnormal result on screening urine test (03/27/20 & 08/20/20); Chronic hip pain (Bilateral); Chronic hip pain (Right); Enthesopathy of hip region (Right); Osteoarthritis of hip (Right); and Trigger point of shoulder region (Right) on their pertinent problem list. Pain Assessment: Severity of   is reported as a  /10. Location:    / . Onset:  . Quality:  . Timing:  . Modifying factor(s):  Marland Kitchen Vitals:  vitals were not taken for this visit.  BMI: Estimated body mass index is 26.94 kg/m as calculated from the following:   Height as of 10/13/22: 5\' 7"  (1.702 m).   Weight as of 10/13/22: 172 lb (78 kg). Last encounter: 10/13/2022. Last procedure: 08/19/2022.  Reason for encounter:  *** . ***  Pharmacotherapy Assessment  Analgesic: Hydrocodone/APAP 5/325, 1 tab PO QD (5 mg/day of hydrocodone) MME/day: 5 mg/day.   Monitoring: [place] PMP: PDMP reviewed during this encounter.       Pharmacotherapy: No side-effects or adverse reactions reported. Compliance: No problems identified. Effectiveness: Clinically acceptable.  No notes on file  No results found for: "CBDTHCR" No results found for: "D8THCCBX" No results found for: "D9THCCBX"  UDS:  Summary  Date Value Ref Range Status  04/19/2022 Note  Final    Comment:    ==================================================================== [facility]  (MW) ==================================================================== Test                             Result  Flag       Units  Drug Present and Declared for Prescription Verification   Hydrocodone                    520          EXPECTED   ng/mg creat   Dihydrocodeine                 94           EXPECTED   ng/mg creat   Norhydrocodone                 307          EXPECTED   ng/mg creat    Sources of hydrocodone include scheduled prescription medications.    Dihydrocodeine and norhydrocodone are expected metabolites of    hydrocodone. Dihydrocodeine is also available as a scheduled    prescription medication.  ==================================================================== Test                      Result    Flag   Units      Ref Range   Creatinine              301              mg/dL      >=20 ==================================================================== Declared Medications:  The flagging and interpretation on this report are based on the  following declared medications.  Unexpected results may arise from  inaccuracies in the declared medications.   **Note: The testing scope of this panel includes these medications:   Hydrocodone (Norco)   **Note: The testing scope of this panel does not include the  following reported medications:   Acetaminophen (Tylenol)  Acetaminophen (Norco)  Atenolol (Tenormin)  Atorvastatin (Lipitor)  Baclofen (Lioresal)  Buspirone (Buspar)  Calcium  Cyanocobalamin  Desloratadine (Clarinex)  Diphenhydramine  Docusate (Colace)  Duloxetine (Cymbalta)  Fenofibrate  Fluticasone (Flonase)  Glucosamine  Hydroxyzine (Atarax)  Ibuprofen (Advil)  Levalbuterol (Xopenex)  Magnesium (Mag-Ox)  Melatonin  Methylsulfonylmethane  Multivitamin  Omeprazole (Prilosec)  Ondansetron (Zofran)  Potassium  Simethicone  Sucralfate (Carafate)  Supplement  Vitamin C  Vitamin D3  Vitamin  E ==================================================================== For clinical consultation, please call [phone]. ====================================================================       ROS  Constitutional: Denies any fever or chills Gastrointestinal: No reported hemesis, hematochezia, vomiting, or acute GI distress Musculoskeletal: Denies any acute onset joint swelling, redness, loss of ROM, or weakness Neurological: No reported episodes of acute onset apraxia, aphasia, dysarthria, agnosia, amnesia, paralysis, loss of coordination, or loss of consciousness  Medication Review  Apoaequorin, Calcium Carbonate, Cholecalciferol, DULoxetine, Elderberry, Glucosamine-Chondroit-Vit C-Mn, HYDROcodone-acetaminophen, Methylsulfonylmethane, Potassium, acetaminophen, atenolol, atorvastatin, baclofen, busPIRone, cyanocobalamin, desloratadine-pseudoephedrine, diphenhydrAMINE HCl, docusate sodium, fenofibrate, fluticasone, hydrOXYzine, ibuprofen, levalbuterol, magnesium oxide, melatonin, multivitamin, naloxone, omeprazole, ondansetron, simethicone, sucralfate, and vitamin E  History Review  Allergy: Jose Washington has No Known Allergies. Drug: Jose Washington  reports no history of drug use. Alcohol:  reports no history of alcohol use. Tobacco:  reports that he has never smoked. He has never used smokeless tobacco. Social: Jose Washington  reports that he has never smoked. He has never used smokeless tobacco. He reports that he does not drink alcohol and does not use drugs. Medical:  has a past medical history of Allergy, Anxiety, Chronic duodenal ulcer with hemorrhage (2012), Chronic neck pain, Depression, GERD (gastroesophageal reflux disease), Hyperlipidemia, Hypertension, and Microscopic hematuria. Surgical: Jose Washington  has a past surgical  history that includes eye muscle repair (1972 and 1975); bLEEDING ULCER (2012); Flexible sigmoidoscopy; Colonoscopy with propofol (N/A, 03/24/2021); and  Esophagogastroduodenoscopy (egd) with propofol (N/A, 03/24/2021). Family: family history includes Allergies in his brother; Cancer in his maternal grandfather and mother; Dementia in his father and paternal grandfather; Depression in his father; Diabetes in his mother; Mental illness in his father; Stroke in his paternal grandfather.  Laboratory Chemistry Profile   Renal Lab Results  Component Value Date   BUN 12 06/24/2022   CREATININE 1.05 06/24/2022   BCR 11 06/24/2022   GFRAA 84 12/24/2020   GFRNONAA >60 02/08/2021    Hepatic Lab Results  Component Value Date   AST 25 06/24/2022   ALT 23 06/24/2022   ALBUMIN 4.7 06/24/2022   ALKPHOS 58 06/24/2022   LIPASE 28 02/08/2021    Electrolytes Lab Results  Component Value Date   NA 141 06/24/2022   K 4.3 06/24/2022   CL 103 06/24/2022   CALCIUM 9.8 06/24/2022   MG 2.0 11/12/2016    Bone Lab Results  Component Value Date   VD25OH 35.3 12/25/2021   25OHVITD1 23 (L) 11/12/2016   25OHVITD2 <1.0 11/12/2016   25OHVITD3 23 11/12/2016    Inflammation (CRP: Acute Phase) (ESR: Chronic Phase) Lab Results  Component Value Date   CRP 1.4 (H) 11/12/2016   ESRSEDRATE 3 11/12/2016         Note: Above Lab results reviewed.  Recent Imaging Review  DG PAIN CLINIC C-ARM 1-60 MIN NO REPORT Fluoro was used, but no Radiologist interpretation will be provided.  Please refer to "NOTES" tab for provider progress note. Note: Reviewed        Physical Exam  General appearance: Well nourished, well developed, and well hydrated. In no apparent acute distress Mental status: Alert, oriented x 3 (person, place, & time)       Respiratory: No evidence of acute respiratory distress Eyes: PERLA Vitals: There were no vitals taken for this visit. BMI: Estimated body mass index is 26.94 kg/m as calculated from the following:   Height as of 10/13/22: 5\' 7"  (1.702 m).   Weight as of 10/13/22: 172 lb (78 kg). Ideal: Patient weight not  recorded  Assessment   Diagnosis Status  No diagnosis found. Controlled Controlled Controlled   Updated Problems: No problems updated.  Plan of Care  Problem-specific:  No problem-specific Assessment & Plan notes found for this encounter.  Jose Washington has a current medication list which includes the following long-term medication(s): atenolol, fenofibrate, atorvastatin, baclofen, calcium carbonate, diphenhydramine hcl, duloxetine, hydrocodone-acetaminophen, hydrocodone-acetaminophen, hydrocodone-acetaminophen, levalbuterol, omeprazole, potassium, simethicone, and sucralfate.  Pharmacotherapy (Medications Ordered): No orders of the defined types were placed in this encounter.  Orders:  No orders of the defined types were placed in this encounter.  Follow-up plan:   No follow-ups on file.      Interventional Therapies  Risk  Complexity Considerations:   Estimated body mass index is 28.35 kg/m as calculated from the following:   Height as of this encounter: 5\' 7"  (1.702 m).   Weight as of this encounter: 181 lb (82.1 kg). Abnormal UDS (03/27/2020) & (08/20/20) (+) undisclosed carboxy-THC    Planned  Pending:   Therapeutic right cervical facet RFA #1    Under consideration:   Therapeutic right IA hip joint injection #2  Therapeutic right lumbar facet RFA #2  Therapeutic right cervical facet RFA #1    Completed:   Diagnostic/therapeutic right IA hip inj. x1 (12/09/2020) (100/100/95/90-95)  Therapeutic right lumbar facet RFA x2 (11/26/2021) (100/100/75/75)  Palliative right lumbar facet MBB x2 (06/07/2019) (100/100/100 x20 days/>75 x3 months)  Diagnostic/therapeutic left lumbar facet MBB x1 (06/07/2019) (100/100/100 x20 days/>75 x3 months)  Diagnostic/therapeutic left L4-5 LESI x2 (07/25/2018) (n/a)  Palliative left CESI x3 (02/16/2018) (100/100/100/100)  Diagnostic right cervical facet MBB x2 (08/19/2022) (100/100/100/100) (local anesthetic only diagnostic  injections)  Diagnostic left cervical facet MBB x1 (01/10/2020) (100/100/100)  Diagnostic/therapeutic right shoulder region TPI/MNB x2 (07/19/2022) (100/100/100/100)    Therapeutic  Palliative (PRN) options:   Therapeutic lumbar facet RFA   Therapeutic/palliative lumbar facet MBB   Therapeutic L4-5 LESI   Therapeutic CESI   Therapeutic right IA hip joint injection #2  Diagnostic cervical facet MBB #2        Recent Visits No visits were found meeting these conditions. Showing recent visits within past 90 days and meeting all other requirements Future Appointments Date Type Provider Dept  02/02/23 Appointment Milinda Pointer, MD [facility]  Showing future appointments within next 90 days and meeting all other requirements  I discussed the assessment and treatment plan with the patient. The patient was provided an opportunity to ask questions and all were answered. The patient agreed with the plan and demonstrated an understanding of the instructions.  Patient advised to call back or seek an in-person evaluation if the symptoms or condition worsens.  Duration of encounter: *** minutes.  Total time on encounter, as per AMA guidelines included both the face-to-face and non-face-to-face time personally spent by the physician and/or other qualified health care professional(s) on the day of the encounter (includes time in activities that require the physician or other qualified health care professional and does not include time in activities normally performed by clinical staff). Physician's time may include the following activities when performed: Preparing to see the patient (e.g., pre-charting review of records, searching for previously ordered imaging, lab work, and nerve conduction tests) Review of prior analgesic pharmacotherapies. Reviewing PMP Interpreting ordered tests (e.g., lab work, imaging, nerve conduction tests) Performing post-procedure evaluations, including  interpretation of diagnostic procedures Obtaining and/or reviewing separately obtained history Performing a medically appropriate examination and/or evaluation Counseling and educating the patient/family/caregiver Ordering medications, tests, or procedures Referring and communicating with other health care professionals (when not separately reported) Documenting clinical information in the electronic or other health record Independently interpreting results (not separately reported) and communicating results to the patient/ family/caregiver Care coordination (not separately reported)  Note by: Gaspar Cola, MD Date: 02/02/2023; Time: 12:43 PM

## 2023-02-01 NOTE — Patient Instructions (Incomplete)
____________________________________________________________________________________________  Opioid Pain Medication Update  To: All patients taking opioid pain medications. (I.e.: hydrocodone, hydromorphone, oxycodone, oxymorphone, morphine, codeine, methadone, tapentadol, tramadol, buprenorphine, fentanyl, etc.)  Re: Updated review of side effects and adverse reactions of opioid analgesics, as well as new information about long term effects of this class of medications.  Direct risks of long-term opioid therapy are not limited to opioid addiction and overdose. Potential medical risks include serious fractures, breathing problems during sleep, hyperalgesia, immunosuppression, chronic constipation, bowel obstruction, myocardial infarction, and tooth decay secondary to xerostomia.  Unpredictable adverse effects that can occur even if you take your medication correctly: Cognitive impairment, respiratory depression, and death. Most people think that if they take their medication "correctly", and "as instructed", that they will be safe. Nothing could be farther from the truth. In reality, a significant amount of recorded deaths associated with the use of opioids has occurred in individuals that had taken the medication for a long time, and were taking their medication correctly. The following are examples of how this can happen: Patient taking his/her medication for a long time, as instructed, without any side effects, is given a certain antibiotic or another unrelated medication, which in turn triggers a "Drug-to-drug interaction" leading to disorientation, cognitive impairment, impaired reflexes, respiratory depression or an untoward event leading to serious bodily harm or injury, including death.  Patient taking his/her medication for a long time, as instructed, without any side effects, develops an acute impairment of liver and/or kidney function. This will lead to a rapid inability of the body to  breakdown and eliminate their pain medication, which will result in effects similar to an "overdose", but with the same medicine and dose that they had always taken. This again may lead to disorientation, cognitive impairment, impaired reflexes, respiratory depression or an untoward event leading to serious bodily harm or injury, including death.  A similar problem will occur with patients as they grow older and their liver and kidney function begins to decrease as part of the aging process.  Background information: Historically, the original case for using long-term opioid therapy to treat chronic noncancer pain was based on safety assumptions that subsequent experience has called into question. In 1996, the American Pain Society and the American Academy of Pain Medicine issued a consensus statement supporting long-term opioid therapy. This statement acknowledged the dangers of opioid prescribing but concluded that the risk for addiction was low; respiratory depression induced by opioids was short-lived, occurred mainly in opioid-naive patients, and was antagonized by pain; tolerance was not a common problem; and efforts to control diversion should not constrain opioid prescribing. This has now proven to be wrong. Experience regarding the risks for opioid addiction, misuse, and overdose in community practice has failed to support these assumptions.  According to the Centers for Disease Control and Prevention, fatal overdoses involving opioid analgesics have increased sharply over the past decade. Currently, more than 96,700 people die from drug overdoses every year. Opioids are a factor in 7 out of every 10 overdose deaths. Deaths from drug overdose have surpassed motor vehicle accidents as the leading cause of death for individuals between the ages of 35 and 54.  Clinical data suggest that neuroendocrine dysfunction may be very common in both men and women, potentially causing hypogonadism, erectile  dysfunction, infertility, decreased libido, osteoporosis, and depression. Recent studies linked higher opioid dose to increased opioid-related mortality. Controlled observational studies reported that long-term opioid therapy may be associated with increased risk for cardiovascular events. Subsequent meta-analysis concluded   that the safety of long-term opioid therapy in elderly patients has not been proven.   Side Effects and adverse reactions: Common side effects: Drowsiness (sedation). Dizziness. Nausea and vomiting. Constipation. Physical dependence -- Dependence often manifests with withdrawal symptoms when opioids are discontinued or decreased. Tolerance -- As you take repeated doses of opioids, you require increased medication to experience the same effect of pain relief. Respiratory depression -- This can occur in healthy people, especially with higher doses. However, people with COPD, asthma or other lung conditions may be even more susceptible to fatal respiratory impairment.  Uncommon side effects: An increased sensitivity to feeling pain and extreme response to pain (hyperalgesia). Chronic use of opioids can lead to this. Delayed gastric emptying (the process by which the contents of your stomach are moved into your small intestine). Muscle rigidity. Immune system and hormonal dysfunction. Quick, involuntary muscle jerks (myoclonus). Arrhythmia. Itchy skin (pruritus). Dry mouth (xerostomia).  Long-term side effects: Chronic constipation. Sleep-disordered breathing (SDB). Increased risk of bone fractures. Hypothalamic-pituitary-adrenal dysregulation. Increased risk of overdose.  RISKS: Fractures and Falls:  Opioids increase the risk and incidence of falls. This is of particular importance in elderly patients.  Endocrine System:  Long-term administration is associated with endocrine abnormalities (endocrinopathies). (Also known as Opioid-induced Endocrinopathy) Influences  on both the hypothalamic-pituitary-adrenal axis?and the hypothalamic-pituitary-gonadal axis have been demonstrated with consequent hypogonadism and adrenal insufficiency in both sexes. Hypogonadism and decreased levels of dehydroepiandrosterone sulfate have been reported in men and women. Endocrine effects include: Amenorrhoea in women (abnormal absence of menstruation) Reduced libido in both sexes Decreased sexual function Erectile dysfunction in men Hypogonadisms (decreased testicular function with shrinkage of testicles) Infertility Depression and fatigue Loss of muscle mass Anxiety Depression Immune suppression Hyperalgesia Weight gain Anemia Osteoporosis Patients (particularly women of childbearing age) should avoid opioids. There is insufficient evidence to recommend routine monitoring of asymptomatic patients taking opioids in the long-term for hormonal deficiencies.  Immune System: Human studies have demonstrated that opioids have an immunomodulating effect. These effects are mediated via opioid receptors both on immune effector cells and in the central nervous system. Opioids have been demonstrated to have adverse effects on antimicrobial response and anti-tumour surveillance. Buprenorphine has been demonstrated to have no impact on immune function.  Opioid Induced Hyperalgesia: Human studies have demonstrated that prolonged use of opioids can lead to a state of abnormal pain sensitivity, sometimes called opioid induced hyperalgesia (OIH). Opioid induced hyperalgesia is not usually seen in the absence of tolerance to opioid analgesia. Clinically, hyperalgesia may be diagnosed if the patient on long-term opioid therapy presents with increased pain. This might be qualitatively and anatomically distinct from pain related to disease progression or to breakthrough pain resulting from development of opioid tolerance. Pain associated with hyperalgesia tends to be more diffuse than the  pre-existing pain and less defined in quality. Management of opioid induced hyperalgesia requires opioid dose reduction.  Cancer: Chronic opioid therapy has been associated with an increased risk of cancer among noncancer patients with chronic pain. This association was more evident in chronic strong opioid users. Chronic opioid consumption causes significant pathological changes in the small intestine and colon. Epidemiological studies have found that there is a link between opium dependence and initiation of gastrointestinal cancers. Cancer is the second leading cause of death after cardiovascular disease. Chronic use of opioids can cause multiple conditions such as GERD, immunosuppression and renal damage as well as carcinogenic effects, which are associated with the incidence of cancers.   Mortality: Long-term opioid use   has been associated with increased mortality among patients with chronic non-cancer pain (CNCP).  Prescription of long-acting opioids for chronic noncancer pain was associated with a significantly increased risk of all-cause mortality, including deaths from causes other than overdose.  Reference: Von Korff M, Kolodny A, Deyo RA, Chou R. Long-term opioid therapy reconsidered. Ann Intern Med. 2011 Sep 6;155(5):325-8. doi: 10.7326/[id number]. PMID: 21893626; PMCID: PMC3280085. Bedson J, Chen Y, Ashworth J, Hayward RA, Dunn KM, Jordan KP. Risk of adverse events in patients prescribed long-term opioids: A cohort study in the [place] Clinical Practice Research Datalink. Eur J Pain. 2019 May;23(5):908-922. doi: 10.1002/ejp.1357. Epub 2019 Jan 31. PMID: 30620116. Colameco S, Coren JS, Ciervo CA. Continuous opioid treatment for chronic noncancer pain: a time for moderation in prescribing. Postgrad Med. 2009 Jul;121(4):61-6. doi: 10.3810/pgm.2009.07.2032. PMID: 19641271. Chou R, Turner JA, Devine EB, Hansen RN, Sullivan SD, Blazina I, Dana T, Bougatsos C, Deyo RA. The  effectiveness and risks of long-term opioid therapy for chronic pain: a systematic review for a National Institutes of Health Pathways to Prevention Workshop. Ann Intern Med. 2015 Feb 17;162(4):276-86. doi: 10.7326/M14-2559. PMID: 25581257. Warner M, Chen LH, Makuc DM. NCHS Data Brief No. 22. [place]: Centers for Disease Control and Prevention; 2009. Sep, Increase in Fatal Poisonings Involving Opioid Analgesics in the [place], 1999-2006. Song IA, Choi HR, Oh TK. Long-term opioid use and mortality in patients with chronic non-cancer pain: Ten-year follow-up study in [place] from 2010 through 2019. EClinicalMedicine. 2022 Jul 18;51:101558. doi: 10.1016/j.eclinm.2022.101558. PMID: 35875817; PMCID: PMC9304910. Huser, W., Schubert, T., Vogelmann, T. et al. All-cause mortality in patients with long-term opioid therapy compared with non-opioid analgesics for chronic non-cancer pain: a database study. BMC Med 18, 162 (2020). https://doi.org/10.1186/[email or web address] Rashidian H, Zendehdel K, Kamangar F, Malekzadeh R, Haghdoost AA. An Ecological Study of the Association between Opiate Use and Incidence of Cancers. Addict Health. 2016 Fall;8(4):252-260. PMID: 28819556; PMCID: PMC5554805.  Our Goal: Our goal is to control your pain with means other than the use of opioid pain medications.  Our Recommendation: Talk to your physician about coming off of these medications. We can assist you with the tapering down and stopping these medicines. Based on the new information, even if you cannot completely stop the medication, a decrease in the dose may be associated with a lesser risk. Ask for other means of controlling the pain. Decrease or eliminate those factors that significantly contribute to your pain such as smoking, obesity, and a diet heavily tilted towards "inflammatory" nutrients.  Last Updated: 12/29/2022    ____________________________________________________________________________________________     ____________________________________________________________________________________________  Patient Information update  To: All of our patients.  Re: Name change.  It has been made official that our current name, "[facility]"   will soon be changed to "[facility]".   The purpose of this change is to eliminate any confusion created by the concept of our practice being a "Medication Management Pain Clinic". In the past this has led to the misconception that we treat pain primarily by the use of prescription medications.  Nothing can be farther from the truth.   Understanding PAIN MANAGEMENT: To further understand what our practice does, you first have to understand that "Pain Management" is a subspecialty that requires additional training once a physician has completed their specialty training, which can be in either Anesthesia, Neurology, Psychiatry, or Physical Medicine and Rehabilitation (PMR). Each one of these contributes to the final approach taken by each physician to   the management of their patient's pain. To be a "Pain Management Specialist" you must have first completed one of the specialty trainings below.  Anesthesiologists - trained in clinical pharmacology and interventional techniques such as nerve blockade and regional as well as central neuroanatomy. They are trained to block pain before, during, and after surgical interventions.  Neurologists - trained in the diagnosis and pharmacological treatment of complex neurological conditions, such as Multiple Sclerosis, Parkinson's, spinal cord injuries, and other systemic conditions that may be associated with symptoms that may include but are not limited to pain. They tend to rely primarily on the treatment of chronic pain  using prescription medications.  Psychiatrist - trained in conditions affecting the psychosocial wellbeing of patients including but not limited to depression, anxiety, schizophrenia, personality disorders, addiction, and other substance use disorders that may be associated with chronic pain. They tend to rely primarily on the treatment of chronic pain using prescription medications.   Physical Medicine and Rehabilitation (PMR) physicians, also known as physiatrists - trained to treat a wide variety of medical conditions affecting the brain, spinal cord, nerves, bones, joints, ligaments, muscles, and tendons. Their training is primarily aimed at treating patients that have suffered injuries that have caused severe physical impairment. Their training is primarily aimed at the physical therapy and rehabilitation of those patients. They may also work alongside orthopedic surgeons or neurosurgeons using their expertise in assisting surgical patients to recover after their surgeries.  INTERVENTIONAL PAIN MANAGEMENT is sub-subspecialty of Pain Management.  Our physicians are Board-certified in Anesthesia, Pain Management, and Interventional Pain Management.  This meaning that not only have they been trained and Board-certified in their specialty of Anesthesia, and subspecialty of Pain Management, but they have also received further training in the sub-subspecialty of Interventional Pain Management, in order to become Board-certified as INTERVENTIONAL PAIN MANAGEMENT SPECIALIST.    Mission: Our goal is to use our skills in  INTERVENTIONAL PAIN MANAGEMENT as alternatives to the chronic use of prescription opioid medications for the treatment of pain. To make this more clear, we have changed our name to reflect what we do and offer. We will continue to offer medication management assessment and recommendations, but we will not be taking over any patient's medication  management.  ____________________________________________________________________________________________     ____________________________________________________________________________________________  National Pain Medication Shortage  The [place] is experiencing worsening drug shortages. These have had a negative widespread effect on patient care and treatment. Not expected to improve any time soon. Predicted to last past 2029.   Drug shortage list (generic names) Oxycodone IR Oxycodone/APAP Oxymorphone IR Hydromorphone Hydrocodone/APAP Morphine  Where is the problem?  Manufacturing and supply level.  Will this shortage affect you?  Only if you take any of the above pain medications.  How? You may be unable to fill your prescription.  Your pharmacist may offer a "partial fill" of your prescription. (Warning: Do not accept partial fills.) Prescriptions partially filled cannot be transferred to another pharmacy. Read our Medication Rules and Regulation. Depending on how much medicine you are dependent on, you may experience withdrawals when unable to get the medication.  Recommendations: Consider ending your dependence on opioid pain medications. Ask your pain specialist to assist you with the process. Consider switching to a medication currently not in shortage, such as Buprenorphine. Talk to your pain specialist about this option. Consider decreasing your pain medication requirements by managing tolerance thru "Drug Holidays". This may help minimize withdrawals, should you run out of medicine. Control your pain thru   the use of non-pharmacological interventional therapies.   Your prescriber: Prescribers cannot be blamed for shortages. Medication manufacturing and supply issues cannot be fixed by the prescriber.   NOTE: The prescriber is not responsible for supplying the medication, or solving supply issues. Work with your pharmacist to solve it. The patient is responsible for  the decision to take or continue taking the medication and for identifying and securing a legal supply source. By law, supplying the medication is the job and responsibility of the pharmacy. The prescriber is responsible for the evaluation, monitoring, and prescribing of these medications.   Prescribers will NOT: Re-issue prescriptions that have been partially filled. Re-issue prescriptions already sent to a pharmacy.  Re-send prescriptions to a different pharmacy because yours did not have your medication. Ask pharmacist to order more medicine or transfer the prescription to another pharmacy. (Read below.)  New 2023 regulation: "July 02, 2022 Revised Regulation Allows DEA-Registered Pharmacies to Transfer Electronic Prescriptions at a Patient's Request DEA Headquarters Division - Public Information Office Patients now have the ability to request their electronic prescription be transferred to another pharmacy without having to go back to their practitioner to initiate the request. This revised regulation went into effect on Monday, June 28, 2022.     At a patient's request, a DEA-registered retail pharmacy can now transfer an electronic prescription for a controlled substance (schedules II-V) to another DEA-registered retail pharmacy. Prior to this change, patients would have to go through their practitioner to cancel their prescription and have it re-issued to a different pharmacy. The process was taxing and time consuming for both patients and practitioners.    The Drug Enforcement Administration (DEA) published its intent to revise the process for transferring electronic prescriptions on September 19, 2020.  The final rule was published in the federal register on May 27, 2022 and went into effect 30 days later.  Under the final rule, a prescription can only be transferred once between pharmacies, and only if allowed under existing state or other applicable law. The prescription must  remain in its electronic form; may not be altered in any way; and the transfer must be communicated directly between two licensed pharmacists. It's important to note, any authorized refills transfer with the original prescription, which means the entire prescription will be filled at the same pharmacy".  Reference: https://www.dea.gov/stories/2023/2023-07/2022-09-01/revised-regulation-allows-dea-registered-pharmacies-transfer (DEA website announcement)  https://www.govinfo.gov/content/pkg/FR-2022-05-27/pdf/2023-15847.pdf (Federal Register  Department of Justice)   Federal Register / Vol. 88, No. 143 / Thursday, May 27, 2022 / Rules and Regulations DEPARTMENT OF JUSTICE  Drug Enforcement Administration  21 CFR Part 1306  [Docket No. [id number]]  RIN [id number]-AB64 Transfer of Electronic Prescriptions for Schedules II-V Controlled Substances Between Pharmacies for Initial Filling  ____________________________________________________________________________________________     ____________________________________________________________________________________________  Transfer of Pain Medication between Pharmacies  Re: 2023 DEA Clarification on existing regulation  Published on DEA Website: July 02, 2022  Title: Revised Regulation Allows DEA-Registered Pharmacies to Transfer Electronic Prescriptions at a Patient's Request DEA Headquarters Division - Public Information Office  "Patients now have the ability to request their electronic prescription be transferred to another pharmacy without having to go back to their practitioner to initiate the request. This revised regulation went into effect on Monday, June 28, 2022.     At a patient's request, a DEA-registered retail pharmacy can now transfer an electronic prescription for a controlled substance (schedules II-V) to another DEA-registered retail pharmacy. Prior to this change, patients would have to go through their practitioner to  cancel their prescription   and have it re-issued to a different pharmacy. The process was taxing and time consuming for both patients and practitioners.    The Drug Enforcement Administration (DEA) published its intent to revise the process for transferring electronic prescriptions on September 19, 2020.  The final rule was published in the federal register on May 27, 2022 and went into effect 30 days later.  Under the final rule, a prescription can only be transferred once between pharmacies, and only if allowed under existing state or other applicable law. The prescription must remain in its electronic form; may not be altered in any way; and the transfer must be communicated directly between two licensed pharmacists. It's important to note, any authorized refills transfer with the original prescription, which means the entire prescription will be filled at the same pharmacy."    REFERENCES: 1. DEA website announcement https://www.dea.gov/stories/2023/2023-07/2022-09-01/revised-regulation-allows-dea-registered-pharmacies-transfer  2. Department of Justice website  https://www.govinfo.gov/content/pkg/FR-2022-05-27/pdf/2023-15847.pdf  3. DEPARTMENT OF JUSTICE Drug Enforcement Administration 21 CFR Part 1306 [Docket No. [id number]] RIN [id number] "Transfer of Electronic Prescriptions for Schedules II-V Controlled Substances Between Pharmacies for Initial Filling"  ____________________________________________________________________________________________     _______________________________________________________________________  Medication Rules  Purpose: To inform patients, and their family members, of our medication rules and regulations.  Applies to: All patients receiving prescriptions from our practice (written or electronic).  Pharmacy of record: This is the pharmacy where your electronic prescriptions will be sent. Make sure we have the correct one.  Electronic prescriptions: In  compliance with the [place] Strengthen Opioid Misuse Prevention (STOP) Act of 2017 (Session Law 2017-74/H243), effective November 01, 2018, all controlled substances must be electronically prescribed. Written prescriptions, faxing, or calling prescriptions to a pharmacy will no longer be done.  Prescription refills: These will be provided only during in-person appointments. No medications will be renewed without a "face-to-face" evaluation with your provider. Applies to all prescriptions.  NOTE: The following applies primarily to controlled substances (Opioid* Pain Medications).   Type of encounter (visit): For patients receiving controlled substances, face-to-face visits are required. (Not an option and not up to the patient.)  Patient's responsibilities: Pain Pills: Bring all pain pills to every appointment (except for procedure appointments). Pill Bottles: Bring pills in original pharmacy bottle. Bring bottle, even if empty. Always bring the bottle of the most recent fill.  Medication refills: You are responsible for knowing and keeping track of what medications you are taking and when is it that you will need a refill. The day before your appointment: write a list of all prescriptions that need to be refilled. The day of the appointment: give the list to the admitting nurse. Prescriptions will be written only during appointments. No prescriptions will be written on procedure days. If you forget a medication: it will not be "Called in", "Faxed", or "electronically sent". You will need to get another appointment to get these prescribed. No early refills. Do not call asking to have your prescription filled early. Partial  or short prescriptions: Occasionally your pharmacy may not have enough pills to fill your prescription.  NEVER ACCEPT a partial fill or a prescription that is short of the total amount of pills that you were prescribed.  With controlled substances the law allows 72 hours for  the pharmacy to complete the prescription.  If the prescription is not completed within 72 hours, the pharmacist will require a new prescription to be written. This means that you will be short on your medicine and we WILL NOT send another prescription to complete your original   prescription.  Instead, request the pharmacy to send a carrier to a nearby branch to get enough medication to provide you with your full prescription. Prescription Accuracy: You are responsible for carefully inspecting your prescriptions before leaving our office. Have the discharge nurse carefully go over each prescription with you, before taking them home. Make sure that your name is accurately spelled, that your address is correct. Check the name and dose of your medication to make sure it is accurate. Check the number of pills, and the written instructions to make sure they are clear and accurate. Make sure that you are given enough medication to last until your next medication refill appointment. Taking Medication: Take medication as prescribed. When it comes to controlled substances, taking less pills or less frequently than prescribed is permitted and encouraged. Never take more pills than instructed. Never take the medication more frequently than prescribed.  Inform other Doctors: Always inform, all of your healthcare providers, of all the medications you take. Pain Medication from other Providers: You are not allowed to accept any additional pain medication from any other Doctor or Healthcare provider. There are two exceptions to this rule. (see below) In the event that you require additional pain medication, you are responsible for notifying us, as stated below. Cough Medicine: Often these contain an opioid, such as codeine or hydrocodone. Never accept or take cough medicine containing these opioids if you are already taking an opioid* medication. The combination may cause respiratory failure and death. Medication Agreement:  You are responsible for carefully reading and following our Medication Agreement. This must be signed before receiving any prescriptions from our practice. Safely store a copy of your signed Agreement. Violations to the Agreement will result in no further prescriptions. (Additional copies of our Medication Agreement are available upon request.) Laws, Rules, & Regulations: All patients are expected to follow all Federal and State Laws, Statutes, Rules, & Regulations. Ignorance of the Laws does not constitute a valid excuse.  Illegal drugs and Controlled Substances: The use of illegal substances (including, but not limited to marijuana and its derivatives) and/or the illegal use of any controlled substances is strictly prohibited. Violation of this rule may result in the immediate and permanent discontinuation of any and all prescriptions being written by our practice. The use of any illegal substances is prohibited. Adopted CDC guidelines & recommendations: Target dosing levels will be at or below 60 MME/day. Use of benzodiazepines** is not recommended.  Exceptions: There are only two exceptions to the rule of not receiving pain medications from other Healthcare Providers. Exception #1 (Emergencies): In the event of an emergency (i.e.: accident requiring emergency care), you are allowed to receive additional pain medication. However, you are responsible for: As soon as you are able, call our office [phone], at any time of the day or night, and leave a message stating your name, the date and nature of the emergency, and the name and dose of the medication prescribed. In the event that your call is answered by a member of our staff, make sure to document and save the date, time, and the name of the person that took your information.  Exception #2 (Planned Surgery): In the event that you are scheduled by another doctor or dentist to have any type of surgery or procedure, you are allowed (for a period no  longer than 30 days), to receive additional pain medication, for the acute post-op pain. However, in this case, you are responsible for picking up a copy of   our "Post-op Pain Management for Surgeons" handout, and giving it to your surgeon or dentist. This document is available at our office, and does not require an appointment to obtain it. Simply go to our office during business hours (Monday-Thursday from 8:00 AM to 4:00 PM) (Friday 8:00 AM to 12:00 Noon) or if you have a scheduled appointment with us, prior to your surgery, and ask for it by name. In addition, you are responsible for: calling our office [phone], at any time of the day or night, and leaving a message stating your name, name of your surgeon, type of surgery, and date of procedure or surgery. Failure to comply with your responsibilities may result in termination of therapy involving the controlled substances. Medication Agreement Violation. Following the above rules, including your responsibilities will help you in avoiding a Medication Agreement Violation ("Breaking your Pain Medication Contract").  Consequences:  Not following the above rules may result in permanent discontinuation of medication prescription therapy.  *Opioid medications include: morphine, codeine, oxycodone, oxymorphone, hydrocodone, hydromorphone, meperidine, tramadol, tapentadol, buprenorphine, fentanyl, methadone. **Benzodiazepine medications include: diazepam (Valium), alprazolam (Xanax), clonazepam (Klonopine), lorazepam (Ativan), clorazepate (Tranxene), chlordiazepoxide (Librium), estazolam (Prosom), oxazepam (Serax), temazepam (Restoril), triazolam (Halcion) (Last updated: 08/24/2022) ______________________________________________________________________    ______________________________________________________________________  Medication Recommendations and Reminders  Applies to: All patients receiving prescriptions (written and/or  electronic).  Medication Rules & Regulations: You are responsible for reading, knowing, and following our "Medication Rules" document. These exist for your safety and that of others. They are not flexible and neither are we. Dismissing or ignoring them is an act of "non-compliance" that may result in complete and irreversible termination of such medication therapy. For safety reasons, "non-compliance" will not be tolerated. As with the [place] fundamental legal principle of "ignorance of the law is no defense", we will accept no excuses for not having read and knowing the content of documents provided to you by our practice.  Pharmacy of record:  Definition: This is the pharmacy where your electronic prescriptions will be sent.  We do not endorse any particular pharmacy. It is up to you and your insurance to decide what pharmacy to use.  We do not restrict you in your choice of pharmacy. However, once we write for your prescriptions, we will NOT be re-sending more prescriptions to fix restricted supply problems created by your pharmacy, or your insurance.  The pharmacy listed in the electronic medical record should be the one where you want electronic prescriptions to be sent. If you choose to change pharmacy, simply notify our nursing staff. Changes will be made only during your regular appointments and not over the phone.  Recommendations: Keep all of your pain medications in a safe place, under lock and key, even if you live alone. We will NOT replace lost, stolen, or damaged medication. We do not accept "Police Reports" as proof of medications having been stolen. After you fill your prescription, take 1 week's worth of pills and put them away in a safe place. You should keep a separate, properly labeled bottle for this purpose. The remainder should be kept in the original bottle. Use this as your primary supply, until it runs out. Once it's gone, then you know that you have 1 week's worth of medicine,  and it is time to come in for a prescription refill. If you do this correctly, it is unlikely that you will ever run out of medicine. To make sure that the above recommendation works, it is very important that you make   sure your medication refill appointments are scheduled at least 1 week before you run out of medicine. To do this in an effective manner, make sure that you do not leave the office without scheduling your next medication management appointment. Always ask the nursing staff to show you in your prescription , when your medication will be running out. Then arrange for the receptionist to get you a return appointment, at least 7 days before you run out of medicine. Do not wait until you have 1 or 2 pills left, to come in. This is very poor planning and does not take into consideration that we may need to cancel appointments due to bad weather, sickness, or emergencies affecting our staff. DO NOT ACCEPT A "Partial Fill": If for any reason your pharmacy does not have enough pills/tablets to completely fill or refill your prescription, do not allow for a "partial fill". The law allows the pharmacy to complete that prescription within 72 hours, without requiring a new prescription. If they do not fill the rest of your prescription within those 72 hours, you will need a separate prescription to fill the remaining amount, which we will NOT provide. If the reason for the partial fill is your insurance, you will need to talk to the pharmacist about payment alternatives for the remaining tablets, but again, DO NOT ACCEPT A PARTIAL FILL, unless you can trust your pharmacist to obtain the remainder of the pills within 72 hours.  Prescription refills and/or changes in medication(s):  Prescription refills, and/or changes in dose or medication, will be conducted only during scheduled medication management appointments. (Applies to both, written and electronic prescriptions.) No refills on procedure days. No  medication will be changed or started on procedure days. No changes, adjustments, and/or refills will be conducted on a procedure day. Doing so will interfere with the diagnostic portion of the procedure. No phone refills. No medications will be "called into the pharmacy". No Fax refills. No weekend refills. No Holliday refills. No after hours refills.  Remember:  Business hours are:  Monday to Thursday 8:00 AM to 4:00 PM Provider's Schedule: Bader Stubblefield, MD - Appointments are:  Medication management: Monday and Wednesday 8:00 AM to 4:00 PM Procedure day: Tuesday and Thursday 7:30 AM to 4:00 PM Bilal Lateef, MD - Appointments are:  Medication management: Tuesday and Thursday 8:00 AM to 4:00 PM Procedure day: Monday and Wednesday 7:30 AM to 4:00 PM (Last update: 08/24/2022) ______________________________________________________________________    ____________________________________________________________________________________________  Drug Holidays  What is a "Drug Holiday"? Drug Holiday: is the name given to the process of slowly tapering down and temporarily stopping the pain medication for the purpose of decreasing or eliminating tolerance to the drug.  Benefits Improved effectiveness Decreased required effective dose Improved pain control End dependence on high dose therapy Decrease cost of therapy Uncovering "opioid-induced hyperalgesia". (OIH)  What is "opioid hyperalgesia"? It is a paradoxical increase in pain caused by exposure to opioids. Stopping the opioid pain medication, contrary to the expected, it actually decreases or completely eliminates the pain. Ref.: "A comprehensive review of opioid-induced hyperalgesia". Marion Lee, et.al. Pain Physician. 2011 Mar-Apr;14(2):145-61.  What is tolerance? Tolerance: the progressive loss of effectiveness of a pain medicine due to repetitive use. A common problem of opioid pain medications.  How long should a "Drug  Holiday" last? Effectiveness depends on the patient staying off all opioid pain medicines for a minimum of 14 consecutive days. (2 weeks)  How about just taking less of the medicine? Does not   work. Will not accomplish goal of eliminating the excess receptors.  How about switching to a different pain medicine? (AKA. "Opioid rotation") Does not work. Creates the illusion of effectiveness by taking advantage of inaccurate equivalent dose calculations between different opioids. -This "technique" was promoted by studies funded by pharmaceutical companies, such as [employer], creators of "OxyContin".  Can I stop the medicine "cold turkey"? We do not recommend it. You should always coordinate with your prescribing physician to make the transition as smoothly as possible. Avoid stopping the medicine abruptly without consulting. We recommend a "slow taper".  What is a slow taper? Taper: refers to the gradual decrease in dose.   How do I stop/taper the dose? Slowly. Decrease the daily amount of pills that you take by one (1) pill every seven (7) days. This is called a "slow downward taper". Example: if you normally take four (4) pills per day, drop it to three (3) pills per day for seven (7) days, then to two (2) pills per day for seven (7) days, then to one (1) per day for seven (7) days, and then stop the medicine. The 14 day "Drug Holiday" starts on the first day without medicine.   Will I experience withdrawals? Unlikely with a slow taper.  What triggers withdrawals? Withdrawals are triggered by the sudden/abrupt stop of high dose opioids. Withdrawals can be avoided by slowly decreasing the dose over a prolonged period of time.  What are withdrawals? Symptoms associated with sudden/abrupt reduction/stopping of high-dose, long-term use of pain medication. Withdrawal are seldom seen on low dose therapy, or patients rarely taking opioid medication.  Early Withdrawal Symptoms may  include: Agitation Anxiety Muscle aches Increased tearing Insomnia Runny nose Sweating Yawning  Late symptoms may include: Abdominal cramping Diarrhea Dilated pupils Goose bumps Nausea Vomiting  When could I see withdrawals? Onset: 8-24 hours after last use for most opioids. 12-48 hours for long-acting opioids (i.e.: methadone)  How long could they last? Duration: 4-10 days for most opioids. 14-21 days for long-acting opioids (i.e.: methadone)  What will happen after I complete my "Drug Holiday"? The need and indications for the opioid analgesic will be reviewed before restarting the medication. Dose requirements will likely decrease and the dose will need to be adjusted accordingly.   (Last update: 01/19/2023) ____________________________________________________________________________________________    ____________________________________________________________________________________________  WARNING: CBD (cannabidiol) & Delta (Delta-8 tetrahydrocannabinol) products.   Applicable to:  All individuals currently taking or considering taking CBD (cannabidiol) and, more important, all patients taking opioid analgesic controlled substances (pain medication). (Example: oxycodone; oxymorphone; hydrocodone; hydromorphone; morphine; methadone; tramadol; tapentadol; fentanyl; buprenorphine; butorphanol; dextromethorphan; meperidine; codeine; etc.)  Introduction:  Recently there has been a drive towards the use of "natural" products for the treatment of different conditions, including pain anxiety and sleep disorders. Marijuana and hemp are two varieties of the cannabis genus plants. Marijuana and its derivatives are illegal, while hemp and its derivatives are not. Cannabidiol (CBD) and tetrahydrocannabinol (THC), are two natural compounds found in plants of the Cannabis genus. They can both be extracted from hemp or marijuana. Both compounds interact with your body's endocannabinoid  system in very different ways. CBD is associated with pain relief (analgesia) while THC is associated with the psychoactive effects ("the high") obtained from the use of marijuana products. There are two main types of THC: Delta-9, which comes from the marijuana plant and it is illegal, and Delta-8, which comes from the hemp plant, and it is legal. (Both, Delta-9-THC and Delta-8-THC are psychoactive and   give you "the high".)   Legality:  Marijuana and its derivatives: illegal Hemp and its derivatives: Legal (State dependent) UPDATE: (12/18/2021) The Drug Enforcement Agency (DEA) issued a letter stating that "delta" cannabinoids, including Delta-8-THCO and Delta-9-THCO, synthetically derived from hemp do not qualify as hemp and will be viewed as Schedule I drugs. (Schedule I drugs, substances, or chemicals are defined as drugs with no currently accepted medical use and a high potential for abuse. Some examples of Schedule I drugs are: heroin, lysergic acid diethylamide (LSD), marijuana (cannabis), 3,4-methylenedioxymethamphetamine (ecstasy), methaqualone, and peyote.) (https://www.dea.gov)  Legal status of CBD in [place]:  "Conditionally Legal"  Reference: "FDA Regulation of Cannabis and Cannabis-Derived Products, Including Cannabidiol (CBD)" - https://www.fda.gov/news-events/public-health-focus/fda-regulation-cannabis-and-cannabis-derived-products-including-cannabidiol-cbd  Warning:  CBD is not FDA approved and has not undergo the same manufacturing controls as prescription drugs.  This means that the purity and safety of available CBD may be questionable. Most of the time, despite manufacturer's claims, it is contaminated with THC (delta-9-tetrahydrocannabinol - the chemical in marijuana responsible for the "HIGH").  When this is the case, the THC contaminant will trigger a positive urine drug screen (UDS) test for Marijuana (carboxy-THC).   The FDA recently put out a warning about 5 things that everyone  should be aware of regarding Delta-8 THC: Delta-8 THC products have not been evaluated or approved by the FDA for safe use and may be marketed in ways that put the public health at risk. The FDA has received adverse event reports involving delta-8 THC-containing products. Delta-8 THC has psychoactive and intoxicating effects. Delta-8 THC manufacturing often involve use of potentially harmful chemicals to create the concentrations of delta-8 THC claimed in the marketplace. The final delta-8 THC product may have potentially harmful by-products (contaminants) due to the chemicals used in the process. Manufacturing of delta-8 THC products may occur in uncontrolled or unsanitary settings, which may lead to the presence of unsafe contaminants or other potentially harmful substances. Delta-8 THC products should be kept out of the reach of children and pets.  NOTE: Because a positive UDS for any illicit substance is a violation of our medication agreement, your opioid analgesics (pain medicine) may be permanently discontinued.  MORE ABOUT CBD  General Information: CBD was discovered in 1940 and it is a derivative of the cannabis sativa genus plants (Marijuana and Hemp). It is one of the 113 identified substances found in Marijuana. It accounts for up to 40% of the plant's extract. As of 2018, preliminary clinical studies on CBD included research for the treatment of anxiety, movement disorders, and pain. CBD is available and consumed in multiple forms, including inhalation of smoke or vapor, as an aerosol spray, and by mouth. It may be supplied as an oil containing CBD, capsules, dried cannabis, or as a liquid solution. CBD is thought not to be as psychoactive as THC (delta-9-tetrahydrocannabinol - the chemical in marijuana responsible for the "HIGH"). Studies suggest that CBD may interact with different biological target receptors in the body, including cannabinoid and other neurotransmitter receptors. As of  2018 the mechanism of action for its biological effects has not been determined.  Side-effects  Adverse reactions: Dry mouth, diarrhea, decreased appetite, fatigue, drowsiness, malaise, weakness, sleep disturbances, and others.  Drug interactions:  CBD may interact with medications such as blood-thinners. CBD causes drowsiness on its own and it will increase drowsiness caused by other medications, including antihistamines (such as Benadryl), benzodiazepines (Xanax, Ativan, Valium), antipsychotics, antidepressants, opioids, alcohol and supplements such as kava, melatonin and St. John's Wort.    Other drug interactions: Brivaracetam (Briviact); Caffeine; Carbamazepine (Tegretol); Citalopram (Celexa); Clobazam (Onfi); Eslicarbazepine (Aptiom); Everolimus (Zostress); Lithium; Methadone (Dolophine); Rufinamide (Banzel); Sedative medications (CNS depressants); Sirolimus (Rapamune); Stiripentol (Diacomit); Tacrolimus (Prograf); Tamoxifen ; Soltamox); Topiramate (Topamax); Valproate; Warfarin (Coumadin); Zonisamide. (Last update: 10/11/2022) ____________________________________________________________________________________________   ____________________________________________________________________________________________  Naloxone Nasal Spray  Why am I receiving this medication? [place] STOP ACT requires that all patients taking high dose opioids or at risk of opioids respiratory depression, be prescribed an opioid reversal agent, such as Naloxone (AKA: Narcan).  What is this medication? NALOXONE (nal OX one) treats opioid overdose, which causes slow or shallow breathing, severe drowsiness, or trouble staying awake. Call emergency services after using this medication. You may need additional treatment. Naloxone works by reversing the effects of opioids. It belongs to a group of medications called opioid blockers.  COMMON BRAND NAME(S): Kloxxado, Narcan  What should I tell my care team before  I take this medication? They need to know if you have any of these conditions: Heart disease Substance use disorder An unusual or allergic reaction to naloxone, other medications, foods, dyes, or preservatives Pregnant or trying to get pregnant Breast-feeding  When to use this medication? This medication is to be used for the treatment of respiratory depression (less than 8 breaths per minute) secondary to opioid overdose.   How to use this medication? This medication is for use in the nose. Lay the person on their back. Support their neck with your hand and allow the head to tilt back before giving the medication. The nasal spray should be given into 1 nostril. After giving the medication, move the person onto their side. Do not remove or test the nasal spray until ready to use. Get emergency medical help right away after giving the first dose of this medication, even if the person wakes up. You should be familiar with how to recognize the signs and symptoms of a narcotic overdose. If more doses are needed, give the additional dose in the other nostril. Talk to your care team about the use of this medication in children. While this medication may be prescribed for children as young as newborns for selected conditions, precautions do apply.  Naloxone Overdosage: If you think you have taken too much of this medicine contact a poison control center or emergency room at once.  NOTE: This medicine is only for you. Do not share this medicine with others.  What if I miss a dose? This does not apply.  What may interact with this medication? This is only used during an emergency. No interactions are expected during emergency use. This list may not describe all possible interactions. Give your health care provider a list of all the medicines, herbs, non-prescription drugs, or dietary supplements you use. Also tell them if you smoke, drink alcohol, or use illegal drugs. Some items may interact with  your medicine.  What should I watch for while using this medication? Keep this medication ready for use in the case of an opioid overdose. Make sure that you have the phone number of your care team and local hospital ready. You may need to have additional doses of this medication. Each nasal spray contains a single dose. Some emergencies may require additional doses. After use, bring the treated person to the nearest hospital or call 911. Make sure the treating care team knows that the person has received a dose of this medication. You will receive additional instructions on what to do during and after use of this   medication before an emergency occurs.  What side effects may I notice from receiving this medication? Side effects that you should report to your care team as soon as possible: Allergic reactions--skin rash, itching, hives, swelling of the face, lips, tongue, or throat Side effects that usually do not require medical attention (report these to your care team if they continue or are bothersome): Constipation Dryness or irritation inside the nose Headache Increase in blood pressure Muscle spasms Stuffy nose Toothache This list may not describe all possible side effects. Call your doctor for medical advice about side effects. You may report side effects to FDA at 1-800-FDA-1088.  Where should I keep my medication? Because this is an emergency medication, you should keep it with you at all times.  Keep out of the reach of children and pets. Store between 20 and 25 degrees C (68 and 77 degrees F). Do not freeze. Throw away any unused medication after the expiration date. Keep in original box until ready to use.  NOTE: This sheet is a summary. It may not cover all possible information. If you have questions about this medicine, talk to your doctor, pharmacist, or health care provider.   2023 Elsevier/Gold Standard (2021-06-26  00:00:00)  ____________________________________________________________________________________________   

## 2023-02-02 ENCOUNTER — Encounter: Payer: Self-pay | Admitting: Pain Medicine

## 2023-02-02 ENCOUNTER — Ambulatory Visit: Payer: No Typology Code available for payment source | Attending: Pain Medicine | Admitting: Pain Medicine

## 2023-02-02 VITALS — BP 111/72 | HR 61 | Temp 97.5°F | Wt 172.0 lb

## 2023-02-02 DIAGNOSIS — M47816 Spondylosis without myelopathy or radiculopathy, lumbar region: Secondary | ICD-10-CM | POA: Diagnosis present

## 2023-02-02 DIAGNOSIS — F1190 Opioid use, unspecified, uncomplicated: Secondary | ICD-10-CM | POA: Insufficient documentation

## 2023-02-02 DIAGNOSIS — M542 Cervicalgia: Secondary | ICD-10-CM | POA: Diagnosis not present

## 2023-02-02 DIAGNOSIS — M79605 Pain in left leg: Secondary | ICD-10-CM | POA: Diagnosis not present

## 2023-02-02 DIAGNOSIS — M545 Low back pain, unspecified: Secondary | ICD-10-CM | POA: Diagnosis present

## 2023-02-02 DIAGNOSIS — G894 Chronic pain syndrome: Secondary | ICD-10-CM | POA: Diagnosis not present

## 2023-02-02 DIAGNOSIS — M25512 Pain in left shoulder: Secondary | ICD-10-CM | POA: Diagnosis present

## 2023-02-02 DIAGNOSIS — Z79891 Long term (current) use of opiate analgesic: Secondary | ICD-10-CM | POA: Insufficient documentation

## 2023-02-02 DIAGNOSIS — M549 Dorsalgia, unspecified: Secondary | ICD-10-CM | POA: Insufficient documentation

## 2023-02-02 DIAGNOSIS — G8929 Other chronic pain: Secondary | ICD-10-CM | POA: Insufficient documentation

## 2023-02-02 DIAGNOSIS — Z79899 Other long term (current) drug therapy: Secondary | ICD-10-CM | POA: Insufficient documentation

## 2023-02-02 DIAGNOSIS — M25511 Pain in right shoulder: Secondary | ICD-10-CM | POA: Diagnosis present

## 2023-02-02 DIAGNOSIS — M47812 Spondylosis without myelopathy or radiculopathy, cervical region: Secondary | ICD-10-CM | POA: Diagnosis not present

## 2023-02-02 MED ORDER — HYDROCODONE-ACETAMINOPHEN 5-325 MG PO TABS: 1.0000 | ORAL_TABLET | Freq: Every day | ORAL | 0 refills | Status: DC | PRN

## 2023-02-02 NOTE — Progress Notes (Signed)
Nursing Pain Medication Assessment:  Safety precautions to be maintained throughout the outpatient stay will include: orient to surroundings, keep bed in low position, maintain call bell within reach at all times, provide assistance with transfer out of bed and ambulation.  Medication Inspection Compliance: Pill count conducted under aseptic conditions, in front of the patient. Neither the pills nor the bottle was removed from the patient's sight at any time. Once count was completed pills were immediately returned to the patient in their original bottle.  Medication: Hydrocodone/APAP Pill/Patch Count:  6 1/2 of 30 pills remain Pill/Patch Appearance: Markings consistent with prescribed medication Bottle Appearance: Standard pharmacy container. Clearly labeled. Filled Date: 3 / 12 / 2024 Last Medication intake:  YesterdaySafety precautions to be maintained throughout the outpatient stay will include: orient to surroundings, keep bed in low position, maintain call bell within reach at all times, provide assistance with transfer out of bed and ambulation.

## 2023-02-03 ENCOUNTER — Other Ambulatory Visit: Payer: Self-pay | Admitting: Nurse Practitioner

## 2023-02-03 DIAGNOSIS — M7918 Myalgia, other site: Secondary | ICD-10-CM

## 2023-02-03 DIAGNOSIS — M62838 Other muscle spasm: Secondary | ICD-10-CM

## 2023-02-03 NOTE — Telephone Encounter (Signed)
Requested Prescriptions  Pending Prescriptions Disp Refills   busPIRone (BUSPAR) 30 MG tablet [Pharmacy Med Name: BUSPIRONE HCL 30 MG TABLET] 180 tablet 0    Sig: TAKE 1 TABLET BY MOUTH 2 TIMES DAILY.     Psychiatry: Anxiolytics/Hypnotics - Non-controlled Passed - 02/03/2023  8:05 AM      Passed - Valid encounter within last 12 months    Recent Outpatient Visits           7 months ago Aortic atherosclerosis ([place])   [place] Jon Billings, NP   1 year ago Annual physical exam   [place] Jon Billings, NP   1 year ago Upper respiratory tract infection, unspecified type   [place] McElwee, Lauren A, NP   1 year ago Aortic atherosclerosis ([place])   [place] Jon Billings, NP   2 years ago Benign essential hypertension   [place], Henrine Screws T, NP       Future Appointments             In 2 weeks Jon Billings, NP [place], [facility]             baclofen (LIORESAL) 10 MG tablet [Pharmacy Med Name: BACLOFEN 10 MG TABLET] 90 tablet 0    Sig: TAKE 1 TABLET BY MOUTH EVERYDAY AT BEDTIME     Analgesics:  Muscle Relaxants - baclofen Failed - 02/03/2023  8:05 AM      Failed - Cr in normal range and within 180 days    Creatinine, Ser  Date Value Ref Range Status  06/24/2022 1.05 0.76 - 1.27 mg/dL Final         Failed - eGFR is 30 or above and within 180 days    GFR calc Af Amer  Date Value Ref Range Status  12/24/2020 84 >59 mL/min/1.73 Final    Comment:    **In accordance with recommendations from the NKF-ASN Task force,**   [facility] is in the process of updating its eGFR calculation to the   2021 CKD-EPI creatinine equation that estimates kidney function   without a race variable.    GFR, Estimated  Date Value Ref Range Status  02/08/2021 >60 >60 mL/min Final    Comment:    (NOTE) Calculated using  the CKD-EPI Creatinine Equation (2021)    eGFR  Date Value Ref Range Status  06/24/2022 83 >59 mL/min/1.73 Final         Failed - Valid encounter within last 6 months    Recent Outpatient Visits           7 months ago Aortic atherosclerosis ([place])   [place] Jon Billings, NP   1 year ago Annual physical exam   [place] Jon Billings, NP   1 year ago Upper respiratory tract infection, unspecified type   [place], NP   1 year ago Aortic atherosclerosis [facility])   [place] Jon Billings, NP   2 years ago Benign essential hypertension   [place] Venita Lick, NP       Future Appointments             In 2 weeks Jon Billings, NP [place], [facility]             DULoxetine (CYMBALTA) 30 MG  capsule [Pharmacy Med Name: DULOXETINE HCL DR 30 MG CAP] 180 capsule 0    Sig: TAKE 1 CAPSULE BY MOUTH 2 TIMES DAILY.     Psychiatry: Antidepressants - SNRI - duloxetine Failed - 02/03/2023  8:05 AM      Failed - Valid encounter within last 6 months    Recent Outpatient Visits           7 months ago Aortic atherosclerosis ([place])   [place] Jon Billings, NP   1 year ago Annual physical exam   [place] Jon Billings, NP   1 year ago Upper respiratory tract infection, unspecified type   [place] McElwee, Lauren A, NP   1 year ago Aortic atherosclerosis [facility])   [place] Jon Billings, NP   2 years ago Benign essential hypertension   [place], Henrine Screws T, NP       Future Appointments             In 2 weeks Jon Billings, NP [place], [facility]            Passed - Cr in normal range and within 360 days     Creatinine, Ser  Date Value Ref Range Status  06/24/2022 1.05 0.76 - 1.27 mg/dL Final         Passed - eGFR is 30 or above and within 360 days    GFR calc Af Amer  Date Value Ref Range Status  12/24/2020 84 >59 mL/min/1.73 Final    Comment:    **In accordance with recommendations from the NKF-ASN Task force,**   [facility] is in the process of updating its eGFR calculation to the   2021 CKD-EPI creatinine equation that estimates kidney function   without a race variable.    GFR, Estimated  Date Value Ref Range Status  02/08/2021 >60 >60 mL/min Final    Comment:    (NOTE) Calculated using the CKD-EPI Creatinine Equation (2021)    eGFR  Date Value Ref Range Status  06/24/2022 83 >59 mL/min/1.73 Final         Passed - Completed PHQ-2 or PHQ-9 in the last 360 days      Passed - Last BP in normal range    BP Readings from Last 1 Encounters:  02/02/23 111/72

## 2023-02-22 ENCOUNTER — Encounter: Payer: Self-pay | Admitting: Nurse Practitioner

## 2023-02-22 ENCOUNTER — Ambulatory Visit (INDEPENDENT_AMBULATORY_CARE_PROVIDER_SITE_OTHER): Payer: No Typology Code available for payment source | Admitting: Nurse Practitioner

## 2023-02-22 VITALS — BP 98/65 | HR 58 | Temp 98.0°F | Ht 67.0 in | Wt 175.3 lb

## 2023-02-22 DIAGNOSIS — M7918 Myalgia, other site: Secondary | ICD-10-CM

## 2023-02-22 DIAGNOSIS — I10 Essential (primary) hypertension: Secondary | ICD-10-CM | POA: Diagnosis not present

## 2023-02-22 DIAGNOSIS — G8929 Other chronic pain: Secondary | ICD-10-CM | POA: Diagnosis not present

## 2023-02-22 DIAGNOSIS — I700 Atherosclerosis of aorta: Secondary | ICD-10-CM | POA: Diagnosis not present

## 2023-02-22 DIAGNOSIS — E785 Hyperlipidemia, unspecified: Secondary | ICD-10-CM

## 2023-02-22 DIAGNOSIS — Z0000 Encounter for general adult medical examination without abnormal findings: Secondary | ICD-10-CM

## 2023-02-22 DIAGNOSIS — E559 Vitamin D deficiency, unspecified: Secondary | ICD-10-CM

## 2023-02-22 DIAGNOSIS — F325 Major depressive disorder, single episode, in full remission: Secondary | ICD-10-CM

## 2023-02-22 LAB — MICROSCOPIC EXAMINATION
Bacteria, UA: NONE SEEN
Epithelial Cells (non renal): NONE SEEN /hpf (ref 0–10)
RBC, Urine: NONE SEEN /hpf (ref 0–2)

## 2023-02-22 LAB — URINALYSIS, ROUTINE W REFLEX MICROSCOPIC
Bilirubin, UA: NEGATIVE
Glucose, UA: NEGATIVE
Leukocytes,UA: NEGATIVE
Nitrite, UA: NEGATIVE
RBC, UA: NEGATIVE
Specific Gravity, UA: 1.015 (ref 1.005–1.030)
Urobilinogen, Ur: 0.2 mg/dL (ref 0.2–1.0)
pH, UA: 8.5 — ABNORMAL HIGH (ref 5.0–7.5)

## 2023-02-22 MED ORDER — ATORVASTATIN CALCIUM 40 MG PO TABS: 40.0000 mg | ORAL_TABLET | Freq: Every day | ORAL | 1 refills | Status: DC

## 2023-02-22 MED ORDER — BUSPIRONE HCL 30 MG PO TABS: 30.0000 mg | ORAL_TABLET | Freq: Two times a day (BID) | ORAL | 1 refills | Status: DC

## 2023-02-22 MED ORDER — ATENOLOL 50 MG PO TABS: 50.0000 mg | ORAL_TABLET | Freq: Every day | ORAL | 1 refills | Status: DC

## 2023-02-22 MED ORDER — HYDROXYZINE HCL 25 MG PO TABS: 25.0000 mg | ORAL_TABLET | Freq: Three times a day (TID) | ORAL | 1 refills | Status: DC | PRN

## 2023-02-22 MED ORDER — FENOFIBRATE 160 MG PO TABS: 160.0000 mg | ORAL_TABLET | Freq: Every day | ORAL | 1 refills | Status: DC

## 2023-02-22 MED ORDER — BACLOFEN 10 MG PO TABS: ORAL_TABLET | ORAL | 1 refills | Status: DC

## 2023-02-22 MED ORDER — OMEPRAZOLE 20 MG PO CPDR: 20.0000 mg | DELAYED_RELEASE_CAPSULE | Freq: Every day | ORAL | 1 refills | Status: DC

## 2023-02-22 MED ORDER — DULOXETINE HCL 30 MG PO CPEP: 30.0000 mg | ORAL_CAPSULE | Freq: Two times a day (BID) | ORAL | 1 refills | Status: DC

## 2023-02-22 NOTE — Assessment & Plan Note (Signed)
Labs ordered at visit today.  Will make recommendations based on lab results.   

## 2023-02-22 NOTE — Assessment & Plan Note (Signed)
Chronic.  Controlled.  Continue with current medication regimen of Atenolol  daily.  Refills sent today.  Labs ordered today.  Recommend checking blood pressures at home.  Follow up if blood pressures are greater than 140/90s.  Return to clinic in 6 months for reevaluation.  Call sooner if concerns arise.

## 2023-02-22 NOTE — Progress Notes (Signed)
BP 98/65   Pulse (!) 58   Temp 98 F (36.7 C) (Oral)   Ht  (1.702 m)   Wt 175 lb 4.8 oz (79.5 kg)   SpO2 97%   BMI 27.46 kg/m    Subjective:    Patient ID: Jose Washington, male    DOB: 03/15/1966, 57 y.o.   MRN: [id number]  HPI: Jose Washington is a 58 y.o. male presenting on 02/22/2023 for comprehensive medical examination. Current medical complaints include: feels like triglycerides will be elevated.  He currently lives with: Interim Problems from his last visit: no  HYPERTENSION / HYPERLIPIDEMIA Satisfied with current treatment? yes Duration of hypertension: years BP monitoring frequency: not checking BP range:  BP medication side effects: no Past BP meds: atenolol Duration of hyperlipidemia: years Cholesterol medication side effects: no Cholesterol supplements: none Past cholesterol medications: atorvastain (lipitor) and fenofibrate- fenofibrate does cause him some body aches Medication compliance: excellent compliance Aspirin: no Recent stressors: no Recurrent headaches: no Visual changes: no Palpitations: no Dyspnea: no Chest pain: no Lower extremity edema: no Dizzy/lightheaded: no  DEPRESSION/ANXIETY Patient feels like the Duloxetine is still working well for his mood.  Also doing Buspar  BID which is also working well for him.  Denies concerns at visit today. Denies SI.   Depression Screen done today and results listed below:     02/22/2023   10:38 AM 06/24/2022   10:13 AM 03/11/2022    8:57 AM 01/25/2022   11:10 AM 12/25/2021   10:05 AM  Depression screen PHQ 2/9  Decreased Interest 0 0 0 0 0  Down, Depressed, Hopeless 0 0 0 0 0  PHQ - 2 Score 0 0 0 0 0  Altered sleeping 0 1   0  Tired, decreased energy 0 0   0  Change in appetite 0 0   0  Feeling bad or failure about yourself  0 0   0  Trouble concentrating 0 0   0  Moving slowly or fidgety/restless 0 0   0  Suicidal thoughts 0 0   0  PHQ-9 Score 0 1   0  Difficult doing work/chores  Not difficult at all Not difficult at all   Not difficult at all    The patient does not have a history of falls. I did complete a risk assessment for falls. A plan of care for falls was documented.   Past Medical History:  Past Medical History:  Diagnosis Date   Allergy    Anxiety    Chronic duodenal ulcer with hemorrhage 2012   Chronic neck pain    Depression    GERD (gastroesophageal reflux disease)    Hyperlipidemia    Hypertension    Microscopic hematuria     Surgical History:  Past Surgical History:  Procedure Laterality Date   bLEEDING ULCER  2012   COLONOSCOPY WITH PROPOFOL N/A 03/24/2021   Procedure: COLONOSCOPY WITH PROPOFOL;  Surgeon: Midge Minium, MD;  Location: [facility] ENDOSCOPY;  Service: Endoscopy;  Laterality: N/A;   ESOPHAGOGASTRODUODENOSCOPY (EGD) WITH PROPOFOL N/A 03/24/2021   Procedure: ESOPHAGOGASTRODUODENOSCOPY (EGD) WITH PROPOFOL;  Surgeon: Midge Minium, MD;  Location: [facility] ENDOSCOPY;  Service: Endoscopy;  Laterality: N/A;   eye muscle repair  1972 and 1975   FLEXIBLE SIGMOIDOSCOPY      Medications:  Current Outpatient Medications on File Prior to Visit  Medication Sig   acetaminophen (TYLENOL) 500 MG tablet Take 1 tablet (500 mg total) by mouth every 6 (six) hours  as needed.   Apoaequorin (PREVAGEN EXTRA STRENGTH) 20 MG CAPS Take 20 mg by mouth daily.   Calcium Carbonate (CALCIUM 600 PO) Take 600 mg by mouth daily.   Cholecalciferol (VITAMIN D3) 1000 units CAPS Take 1 capsule by mouth daily.   desloratadine-pseudoephedrine (CLARINEX-D 12-HOUR) 2.5-120 MG 12 hr tablet Take 1 tablet by mouth as needed.   DiphenhydrAMINE HCl (DIPHEDRYL ALLERGY PO) Take 25 mg by mouth daily.   docusate sodium (COLACE) 100 MG capsule Take 100 mg by mouth daily.   ELDERBERRY PO Take 1,000 mg by mouth 3 (three) times a week.   fluticasone (FLONASE) 50 MCG/ACT nasal spray Place 2 sprays into both nostrils 2 (two) times daily.   Glucosamine-Chondroit-Vit C-Mn (GLUCOSAMINE 1500  COMPLEX PO) Take by mouth 2 (two) times daily.   HYDROcodone-acetaminophen (NORCO/VICODIN) 5-325 MG tablet Take 1 tablet by mouth daily as needed for severe pain. Must last 30 days.   [START ON 03/09/2023] HYDROcodone-acetaminophen (NORCO/VICODIN) 5-325 MG tablet Take 1 tablet by mouth daily as needed for severe pain. Must last 30 days.   [START ON 04/08/2023] HYDROcodone-acetaminophen (NORCO/VICODIN) 5-325 MG tablet Take 1 tablet by mouth daily as needed for severe pain. Must last 30 days.   ibuprofen (ADVIL,MOTRIN) 200 MG tablet Take 200 mg by mouth as needed (take 5 times a week).    levalbuterol (XOPENEX HFA) 45 MCG/ACT inhaler INHALE 1 PUFF INTO THE LUNGS EVERY 6 HOURS AS NEEDED FOR WHEEZING.   magnesium oxide (MAG-OX) 400 MG tablet Take 400 mg by mouth daily.   Melatonin 5 MG TABS Take by mouth at bedtime.    Methylsulfonylmethane (MSM PO) Take by mouth daily.    Multiple Vitamin (MULTIVITAMIN) tablet Take 1 tablet by mouth daily.   naloxone (NARCAN) nasal spray 4 mg/0.1 mL Place 1 spray into the nose as needed for up to 365 doses (for opioid-induced respiratory depresssion). In case of emergency (overdose), spray once into each nostril. If no response within 3 minutes, repeat application and call 911.   ondansetron (ZOFRAN) 8 MG tablet TAKE 1 TABLET (8 MG TOTAL) BY MOUTH 2 (TWO) TIMES DAILY.   Potassium 99 MG TABS Take 1 tablet by mouth.   simethicone (MYLICON) 80 MG chewable tablet Chew 80 mg by mouth every 6 (six) hours as needed for flatulence.   sucralfate (CARAFATE) 1 g tablet TAKE 1 TABLET BY MOUTH DAILY.   vitamin B-12 (CYANOCOBALAMIN) 1000 MCG tablet Take 1,000 mcg by mouth daily.   vitamin E 180 MG (400 UNITS) capsule Take 400 Units by mouth daily.   No current facility-administered medications on file prior to visit.    Allergies:  No Known Allergies  Social History:  Social History   Socioeconomic History   Marital status: Single    Spouse name: Not on file   Number of  children: Not on file   Years of education: Not on file   Highest education level: Not on file  Occupational History   Not on file  Tobacco Use   Smoking status: Never   Smokeless tobacco: Never  Vaping Use   Vaping Use: Never used  Substance and Sexual Activity   Alcohol use: No    Alcohol/week: 0.0 standard drinks of alcohol   Drug use: No   Sexual activity: Never    Partners: Female  Other Topics Concern   Not on file  Social History Narrative   Not on file   Social Determinants of Health   Financial Resource Strain: Low Risk  (  02/16/2018)   Overall Financial Resource Strain (CARDIA)    Difficulty of Paying Living Expenses: Not hard at all  Food Insecurity: Unknown (02/16/2018)   Hunger Vital Sign    Worried About Running Out of Food in the Last Year: Patient declined    Ran Out of Food in the Last Year: Patient declined  Transportation Needs: Unknown (02/16/2018)   PRAPARE - Administrator, Civil Service (Medical): Patient declined    Lack of Transportation (Non-Medical): Patient declined  Physical Activity: Unknown (02/16/2018)   Exercise Vital Sign    Days of Exercise per Week: Patient declined    Minutes of Exercise per Session: Patient declined  Stress: No Stress Concern Present (02/16/2018)   [employer] of Occupational Health - Occupational Stress Questionnaire    Feeling of Stress : Not at all  Social Connections: Unknown (02/16/2018)   Social Connection and Isolation Panel [NHANES]    Frequency of Communication with Friends and Family: Patient declined    Frequency of Social Gatherings with Friends and Family: Patient declined    Attends Religious Services: Patient declined    Database administrator or Organizations: Patient declined    Attends Banker Meetings: Patient declined    Marital Status: Patient declined  Intimate Partner Violence: Unknown (02/16/2018)   Humiliation, Afraid, Rape, and Kick questionnaire    Fear of Current  or Ex-Partner: Patient declined    Emotionally Abused: Patient declined    Physically Abused: Patient declined    Sexually Abused: Patient declined   Social History   Tobacco Use  Smoking Status Never  Smokeless Tobacco Never   Social History   Substance and Sexual Activity  Alcohol Use No   Alcohol/week: 0.0 standard drinks of alcohol    Family History:  Family History  Problem Relation Age of Onset   Diabetes Mother    Cancer Mother        breast- in remission   Mental illness Father    Depression Father    Dementia Father        VASCULAR   Allergies Brother    Cancer Maternal Grandfather        colon   Stroke Paternal Grandfather    Dementia Paternal Grandfather        vascular    Past medical history, surgical history, medications, allergies, family history and social history reviewed with patient today and changes made to appropriate areas of the chart.   Review of Systems  Eyes:  Negative for blurred vision and double vision.  Respiratory:  Negative for shortness of breath.   Cardiovascular:  Negative for chest pain, palpitations and leg swelling.  Musculoskeletal:  Positive for myalgias.  Neurological:  Negative for dizziness and headaches.  Psychiatric/Behavioral:  Negative for depression. The patient is not nervous/anxious.    All other ROS negative except what is listed above and in the HPI.      Objective:    BP 98/65   Pulse (!) 58   Temp 98 F (36.7 C) (Oral)   Ht 5\' 7"  (1.702 m)   Wt 175 lb 4.8 oz (79.5 kg)   SpO2 97%   BMI 27.46 kg/m   Wt Readings from Last 3 Encounters:  02/22/23 175 lb 4.8 oz (79.5 kg)  02/02/23 172 lb (78 kg)  10/13/22 172 lb (78 kg)    Physical Exam Vitals and nursing note reviewed.  Constitutional:      General: He is not in acute distress.  Appearance: Normal appearance. He is normal weight. He is not ill-appearing, toxic-appearing or diaphoretic.  HENT:     Head: Normocephalic.     Right Ear: Tympanic  membrane, ear canal and external ear normal.     Left Ear: Tympanic membrane, ear canal and external ear normal.     Nose: Nose normal. No congestion or rhinorrhea.     Mouth/Throat:     Mouth: Mucous membranes are moist.  Eyes:     General:        Right eye: No discharge.        Left eye: No discharge.     Extraocular Movements: Extraocular movements intact.     Conjunctiva/sclera: Conjunctivae normal.     Pupils: Pupils are equal, round, and reactive to light.  Cardiovascular:     Rate and Rhythm: Normal rate and regular rhythm.     Heart sounds: No murmur heard. Pulmonary:     Effort: Pulmonary effort is normal. No respiratory distress.     Breath sounds: Normal breath sounds. No wheezing, rhonchi or rales.  Abdominal:     General: Abdomen is flat. Bowel sounds are normal. There is no distension.     Palpations: Abdomen is soft.     Tenderness: There is no abdominal tenderness. There is no guarding.  Musculoskeletal:     Cervical back: Normal range of motion and neck supple.  Skin:    General: Skin is warm and dry.     Capillary Refill: Capillary refill takes less than 2 seconds.  Neurological:     General: No focal deficit present.     Mental Status: He is alert and oriented to person, place, and time.     Cranial Nerves: No cranial nerve deficit.     Motor: No weakness.     Deep Tendon Reflexes: Reflexes normal.  Psychiatric:        Mood and Affect: Mood normal.        Behavior: Behavior normal.        Thought Content: Thought content normal.        Judgment: Judgment normal.     Results for orders placed or performed in visit on 06/24/22  Comp Met (CMET)  Result Value Ref Range   Glucose 94 70 - 99 mg/dL   BUN 12 6 - 24 mg/dL   Creatinine, Ser 1.61 0.76 - 1.27 mg/dL   eGFR 83 >09 UE/AVW/0.98   BUN/Creatinine Ratio 11 9 - 20   Sodium 141 134 - 144 mmol/L   Potassium 4.3 3.5 - 5.2 mmol/L   Chloride 103 96 - 106 mmol/L   CO2 22 20 - 29 mmol/L   Calcium 9.8 8.7  - 10.2 mg/dL   Total Protein 7.0 6.0 - 8.5 g/dL   Albumin 4.7 3.8 - 4.9 g/dL   Globulin, Total 2.3 1.5 - 4.5 g/dL   Albumin/Globulin Ratio 2.0 1.2 - 2.2   Bilirubin Total 0.5 0.0 - 1.2 mg/dL   Alkaline Phosphatase 58 44 - 121 IU/L   AST 25 0 - 40 IU/L   ALT 23 0 - 44 IU/L  Lipid Profile  Result Value Ref Range   Cholesterol, Total 147 100 - 199 mg/dL   Triglycerides 119 0 - 149 mg/dL   HDL 41 >14 mg/dL   VLDL Cholesterol Cal 20 5 - 40 mg/dL   LDL Chol Calc (NIH) 86 0 - 99 mg/dL   Chol/HDL Ratio 3.6 0.0 - 5.0 ratio      Assessment &  Plan:   Problem List Items Addressed This Visit       Cardiovascular and Mediastinum   Benign essential hypertension    Chronic.  Controlled.  Continue with current medication regimen of Atenolol 50mg  daily.  Refills sent today.  Labs ordered today.  Recommend checking blood pressures at home.  Follow up if blood pressures are greater than 140/90s.  Return to clinic in 6 months for reevaluation.  Call sooner if concerns arise.        Relevant Medications   atenolol (TENORMIN) 50 MG tablet   atorvastatin (LIPITOR) 40 MG tablet   fenofibrate 160 MG tablet   Other Relevant Orders   Comprehensive metabolic panel   Aortic atherosclerosis    Chronic.  Controlled.  Noted on imaging in 08/14/2019.  Continue with current medication regimen of ASA and atorvastatin.  Refills sent today.  Labs ordered today.  Return to clinic in 6 months for reevaluation.  Call sooner if concerns arise.        Relevant Medications   atenolol (TENORMIN) 50 MG tablet   atorvastatin (LIPITOR) 40 MG tablet   fenofibrate 160 MG tablet     Other   Chronic musculoskeletal pain (Chronic)   Relevant Medications   baclofen (LIORESAL) 10 MG tablet   DULoxetine (CYMBALTA) 30 MG capsule   Hyperlipidemia    Chronic, ongoing.  Continue current medication regimen of Atorvastatin and Fenofibrate daily.  Does have some body aches with Fenofibrate.  Lipid panel today.  Refills sent  today.  Follow up in 6 months.  Call sooner if concerns arise.        Relevant Medications   atenolol (TENORMIN) 50 MG tablet   atorvastatin (LIPITOR) 40 MG tablet   fenofibrate 160 MG tablet   Other Relevant Orders   Lipid panel   Vitamin D insufficiency    Labs ordered at visit today.  Will make recommendations based on lab results.        Relevant Orders   Vitamin D (25 hydroxy)   Major depression in remission    Chronic. Well controlled on Duloxetine 60mg  daily.  Patient also uses Buspar 30mg  BID.  Refill sent today.  Continue with current medication regimen.  Follow up in 6 months.  Call sooner if concerns arise.       Relevant Medications   busPIRone (BUSPAR) 30 MG tablet   DULoxetine (CYMBALTA) 30 MG capsule   hydrOXYzine (ATARAX) 25 MG tablet   Other Visit Diagnoses     Annual physical exam    -  Primary   Health maintenance reviewed during visit today.  Labs ordered. Vacines up to date.  Colon cancer screening up to date.   Relevant Orders   TSH   PSA   Lipid panel   CBC with Differential/Platelet   Comprehensive metabolic panel   Urinalysis, Routine w reflex microscopic        Discussed aspirin prophylaxis for myocardial infarction prevention and decision was it was not indicated  LABORATORY TESTING:  Health maintenance labs ordered today as discussed above.   The natural history of prostate cancer and ongoing controversy regarding screening and potential treatment outcomes of prostate cancer has been discussed with the patient. The meaning of a false positive PSA and a false negative PSA has been discussed. He indicates understanding of the limitations of this screening test and wishes to proceed with screening PSA testing.   IMMUNIZATIONS:   - Tdap: Tetanus vaccination status reviewed: last tetanus booster within 10 years. -  Influenza: postponed till flu season - Pneumovax: Not applicable - Prevnar: Not applicable - COVID: Up to date - HPV: Not  applicable - Shingrix vaccine: Up to date  SCREENING: - Colonoscopy: Up to date  Discussed with patient purpose of the colonoscopy is to detect colon cancer at curable precancerous or early stages   - AAA Screening: Not applicable  -Hearing Test: Not applicable  -Spirometry: Not applicable   PATIENT COUNSELING:    Sexuality: Discussed sexually transmitted diseases, partner selection, use of condoms, avoidance of unintended pregnancy  and contraceptive alternatives.   Advised to avoid cigarette smoking.  I discussed with the patient that most people either abstain from alcohol or drink within safe limits (<=14/week and <=4 drinks/occasion for males, <=7/weeks and <= 3 drinks/occasion for females) and that the risk for alcohol disorders and other health effects rises proportionally with the number of drinks per week and how often a drinker exceeds daily limits.  Discussed cessation/primary prevention of drug use and availability of treatment for abuse.   Diet: Encouraged to adjust caloric intake to maintain  or achieve ideal body weight, to reduce intake of dietary saturated fat and total fat, to limit sodium intake by avoiding high sodium foods and not adding table salt, and to maintain adequate dietary potassium and calcium preferably from fresh fruits, vegetables, and low-fat dairy products.    stressed the importance of regular exercise  Injury prevention: Discussed safety belts, safety helmets, smoke detector, smoking near bedding or upholstery.   Dental health: Discussed importance of regular tooth brushing, flossing, and dental visits.   Follow up plan: NEXT PREVENTATIVE PHYSICAL DUE IN 1 YEAR. Return in about 6 months (around 08/24/2023) for HTN, HLD, DM2 FU.

## 2023-02-22 NOTE — Assessment & Plan Note (Signed)
Chronic, ongoing.  Continue current medication regimen of Atorvastatin and Fenofibrate daily.  Does have some body aches with Fenofibrate.  Lipid panel today.  Refills sent today.  Follow up in 6 months.  Call sooner if concerns arise.

## 2023-02-22 NOTE — Assessment & Plan Note (Signed)
Chronic.  Controlled.  Noted on imaging in 08/14/2019.  Continue with current medication regimen of ASA and atorvastatin.  Refills sent today.  Labs ordered today.  Return to clinic in 6 months for reevaluation.  Call sooner if concerns arise.

## 2023-02-22 NOTE — Assessment & Plan Note (Signed)
Chronic. Well controlled on Duloxetine  daily.  Patient also uses Buspar  BID.  Refill sent today.  Continue with current medication regimen.  Follow up in 6 months.  Call sooner if concerns arise.

## 2023-02-23 LAB — TSH: TSH: 1.26 u[IU]/mL (ref 0.450–4.500)

## 2023-02-23 LAB — COMPREHENSIVE METABOLIC PANEL
ALT: 37 IU/L (ref 0–44)
AST: 37 IU/L (ref 0–40)
Albumin/Globulin Ratio: 2 (ref 1.2–2.2)
Albumin: 4.5 g/dL (ref 3.8–4.9)
Alkaline Phosphatase: 60 IU/L (ref 44–121)
BUN/Creatinine Ratio: 15 (ref 9–20)
BUN: 16 mg/dL (ref 6–24)
Bilirubin Total: 0.6 mg/dL (ref 0.0–1.2)
CO2: 23 mmol/L (ref 20–29)
Calcium: 9.8 mg/dL (ref 8.7–10.2)
Chloride: 104 mmol/L (ref 96–106)
Creatinine, Ser: 1.07 mg/dL (ref 0.76–1.27)
Globulin, Total: 2.2 g/dL (ref 1.5–4.5)
Glucose: 94 mg/dL (ref 70–99)
Potassium: 4.9 mmol/L (ref 3.5–5.2)
Sodium: 140 mmol/L (ref 134–144)
Total Protein: 6.7 g/dL (ref 6.0–8.5)
eGFR: 81 mL/min/{1.73_m2} (ref >59)

## 2023-02-23 LAB — CBC WITH DIFFERENTIAL/PLATELET
Basophils Absolute: 0.1 10*3/uL (ref 0.0–0.2)
Basos: 1 %
EOS (ABSOLUTE): 0.4 10*3/uL (ref 0.0–0.4)
Eos: 5 %
Hematocrit: 44.2 % (ref 37.5–51.0)
Hemoglobin: 14.6 g/dL (ref 13.0–17.7)
Immature Grans (Abs): 0 10*3/uL (ref 0.0–0.1)
Immature Granulocytes: 0 %
Lymphocytes Absolute: 1.6 10*3/uL (ref 0.7–3.1)
Lymphs: 21 %
MCH: 29.3 pg (ref 26.6–33.0)
MCHC: 33 g/dL (ref 31.5–35.7)
MCV: 89 fL (ref 79–97)
Monocytes Absolute: 0.5 10*3/uL (ref 0.1–0.9)
Monocytes: 6 %
Neutrophils Absolute: 5 10*3/uL (ref 1.4–7.0)
Neutrophils: 67 %
Platelets: 346 10*3/uL (ref 150–450)
RBC: 4.99 x10E6/uL (ref 4.14–5.80)
RDW: 13.5 % (ref 11.6–15.4)
WBC: 7.6 10*3/uL (ref 3.4–10.8)

## 2023-02-23 LAB — LIPID PANEL
Chol/HDL Ratio: 4 ratio (ref 0.0–5.0)
Cholesterol, Total: 166 mg/dL (ref 100–199)
HDL: 42 mg/dL (ref >39)
LDL Chol Calc (NIH): 105 mg/dL — ABNORMAL HIGH (ref 0–99)
Triglycerides: 105 mg/dL (ref 0–149)
VLDL Cholesterol Cal: 19 mg/dL (ref 5–40)

## 2023-02-23 LAB — PSA: Prostate Specific Ag, Serum: 0.7 ng/mL (ref 0.0–4.0)

## 2023-02-23 LAB — VITAMIN D 25 HYDROXY (VIT D DEFICIENCY, FRACTURES): Vit D, 25-Hydroxy: 58.2 ng/mL (ref 30.0–100.0)

## 2023-02-23 NOTE — Progress Notes (Signed)
Hi Jose Washington. It was nice to see you yesterday.  Your lab work looks good.  No concerns at this time. Continue with your current medication regimen.  Follow up as discussed.  Please let me know if you have any questions.

## 2023-03-08 ENCOUNTER — Ambulatory Visit: Payer: No Typology Code available for payment source | Admitting: Pain Medicine

## 2023-03-22 ENCOUNTER — Other Ambulatory Visit: Payer: Self-pay | Admitting: Nurse Practitioner

## 2023-03-22 NOTE — Telephone Encounter (Signed)
Requested Prescriptions  Pending Prescriptions Disp Refills   sucralfate (CARAFATE) 1 g tablet [Pharmacy Med Name: SUCRALFATE 1 GM TABLET] 90 tablet 3    Sig: TAKE 1 TABLET BY MOUTH EVERY DAY     Gastroenterology: Antiacids Passed - 03/22/2023  2:21 AM      Passed - Valid encounter within last 12 months    Recent Outpatient Visits           4 weeks ago Annual physical exam   [facility] Larae Grooms, NP   9 months ago Aortic atherosclerosis [facility])   [place] [facility] Larae Grooms, NP   1 year ago Annual physical exam   [facility] Larae Grooms, NP   1 year ago Upper respiratory tract infection, unspecified type   [place] [facility] McElwee, Lauren A, NP   1 year ago Aortic atherosclerosis [facility])   [place] [facility] Larae Grooms, NP       Future Appointments             In 6 months Larae Grooms, NP [facility]

## 2023-03-30 ENCOUNTER — Telehealth: Payer: Self-pay | Admitting: Pain Medicine

## 2023-03-30 NOTE — Telephone Encounter (Signed)
Patient is having some new pain and new symptoms. Numbness and tingling and disorientation during the night. He is worried about it happening during the day. He has an appt coming up on 6-11 for injections. He is going to call his pcp to see what he suggest. Please call patient. Let us know if he should come in next week to see Dr Laban Emperor.

## 2023-03-31 NOTE — Telephone Encounter (Signed)
Spoke with patient.  States he is having numbness and tingling ini his right arm.  States  approximately 3 times per month he has where both arms feel very heavy and he can't hold things.  States after a few minutes, the arm gets back to normal.  States he feels real tight at the base of his neck.  Also states that one night he woke up very disoriented.  Denies any new medications.  Informed patient that Dr Laban Emperor was out of the office and for them to follow up with the PCP or go to the ED if necessary.  Will forward this message to Dr Laban Emperor for any input.  Patient states understanding.

## 2023-04-04 ENCOUNTER — Telehealth: Payer: Self-pay

## 2023-04-04 NOTE — Telephone Encounter (Signed)
Dr Laban Emperor, I sopke to the patient.  He would really like to get some cervical xrays since it has been a very long time since he had any.  He is scheduled for a cervical facet RFA on 04-12-2023.  If you will order him xrays, then he will make an appointmnet to discuss those aside from the procedure on 04-12-23.  Please let me know if you order those xrays and I will let him know.  Thank you.

## 2023-04-06 ENCOUNTER — Other Ambulatory Visit (HOSPITAL_BASED_OUTPATIENT_CLINIC_OR_DEPARTMENT_OTHER): Payer: No Typology Code available for payment source | Admitting: Pain Medicine

## 2023-04-06 DIAGNOSIS — M5030 Other cervical disc degeneration, unspecified cervical region: Secondary | ICD-10-CM

## 2023-04-06 DIAGNOSIS — M47812 Spondylosis without myelopathy or radiculopathy, cervical region: Secondary | ICD-10-CM

## 2023-04-06 DIAGNOSIS — M542 Cervicalgia: Secondary | ICD-10-CM

## 2023-04-06 DIAGNOSIS — M4802 Spinal stenosis, cervical region: Secondary | ICD-10-CM

## 2023-04-06 NOTE — Telephone Encounter (Signed)
Patient notified

## 2023-04-06 NOTE — Progress Notes (Signed)
Continues to have neck pain and he is pending radiofrequency ablation of the cervical facets.  However his last set of x-rays were done in 2018.  Today I will be ordering x-rays of the cervical spine to reevaluate the area prior to treatment.

## 2023-04-11 ENCOUNTER — Ambulatory Visit
Admission: RE | Admit: 2023-04-11 | Discharge: 2023-04-11 | Disposition: A | Discharge status: Home / Self Care | Payer: No Typology Code available for payment source | Attending: Pain Medicine | Admitting: Pain Medicine

## 2023-04-11 ENCOUNTER — Ambulatory Visit
Admission: RE | Admit: 2023-04-11 | Discharge: 2023-04-11 | Disposition: A | Discharge status: Home / Self Care | Payer: No Typology Code available for payment source | Source: Ambulatory Visit | Attending: Pain Medicine | Admitting: Pain Medicine

## 2023-04-11 DIAGNOSIS — M4802 Spinal stenosis, cervical region: Secondary | ICD-10-CM | POA: Diagnosis present

## 2023-04-11 DIAGNOSIS — M47812 Spondylosis without myelopathy or radiculopathy, cervical region: Secondary | ICD-10-CM | POA: Insufficient documentation

## 2023-04-11 DIAGNOSIS — M5030 Other cervical disc degeneration, unspecified cervical region: Secondary | ICD-10-CM | POA: Insufficient documentation

## 2023-04-11 DIAGNOSIS — M542 Cervicalgia: Secondary | ICD-10-CM | POA: Diagnosis present

## 2023-04-12 ENCOUNTER — Ambulatory Visit: Payer: No Typology Code available for payment source | Attending: Pain Medicine | Admitting: Pain Medicine

## 2023-04-12 ENCOUNTER — Encounter: Payer: Self-pay | Admitting: Pain Medicine

## 2023-04-12 ENCOUNTER — Ambulatory Visit
Admission: RE | Admit: 2023-04-12 | Discharge: 2023-04-12 | Disposition: A | Discharge status: Home / Self Care | Payer: No Typology Code available for payment source | Source: Ambulatory Visit | Attending: Pain Medicine | Admitting: Pain Medicine

## 2023-04-12 VITALS — BP 125/85 | HR 58 | Temp 97.3°F | Resp 19 | Ht 67.0 in | Wt 170.0 lb

## 2023-04-12 DIAGNOSIS — M47812 Spondylosis without myelopathy or radiculopathy, cervical region: Secondary | ICD-10-CM | POA: Insufficient documentation

## 2023-04-12 DIAGNOSIS — G8918 Other acute postprocedural pain: Secondary | ICD-10-CM | POA: Insufficient documentation

## 2023-04-12 DIAGNOSIS — M5030 Other cervical disc degeneration, unspecified cervical region: Secondary | ICD-10-CM | POA: Diagnosis present

## 2023-04-12 DIAGNOSIS — M549 Dorsalgia, unspecified: Secondary | ICD-10-CM | POA: Diagnosis present

## 2023-04-12 DIAGNOSIS — M542 Cervicalgia: Secondary | ICD-10-CM | POA: Diagnosis present

## 2023-04-12 DIAGNOSIS — Z5189 Encounter for other specified aftercare: Secondary | ICD-10-CM | POA: Diagnosis present

## 2023-04-12 DIAGNOSIS — G8929 Other chronic pain: Secondary | ICD-10-CM | POA: Insufficient documentation

## 2023-04-12 MED ORDER — OXYCODONE-ACETAMINOPHEN 5-325 MG PO TABS
1.0000 | ORAL_TABLET | Freq: Three times a day (TID) | ORAL | 0 refills | Status: AC | PRN
Start: 2023-04-12 — End: 2023-04-19

## 2023-04-12 MED ORDER — PENTAFLUOROPROP-TETRAFLUOROETH EX AERO
INHALATION_SPRAY | Freq: Once | CUTANEOUS | Status: AC
  Administered 2023-04-12: 30 via TOPICAL
  Filled 2023-04-12: qty 30

## 2023-04-12 MED ORDER — LIDOCAINE HCL 2 % IJ SOLN
20.0000 mL | Freq: Once | INTRAMUSCULAR | Status: AC
  Administered 2023-04-12: 400 mg

## 2023-04-12 MED ORDER — FENTANYL CITRATE (PF) 100 MCG/2ML IJ SOLN
25.0000 ug | INTRAMUSCULAR | Status: DC | PRN
  Administered 2023-04-12: 100 ug via INTRAVENOUS

## 2023-04-12 MED ORDER — LIDOCAINE HCL 2 % IJ SOLN
INTRAMUSCULAR | Status: AC
  Filled 2023-04-12: qty 20

## 2023-04-12 MED ORDER — DEXAMETHASONE SODIUM PHOSPHATE 10 MG/ML IJ SOLN
10.0000 mg | Freq: Once | INTRAMUSCULAR | Status: AC
  Administered 2023-04-12: 10 mg

## 2023-04-12 MED ORDER — MIDAZOLAM HCL 5 MG/5ML IJ SOLN
INTRAMUSCULAR | Status: AC
  Filled 2023-04-12: qty 5

## 2023-04-12 MED ORDER — MIDAZOLAM HCL 5 MG/5ML IJ SOLN
0.5000 mg | Freq: Once | INTRAMUSCULAR | Status: AC
  Administered 2023-04-12: 2 mg via INTRAVENOUS

## 2023-04-12 MED ORDER — FENTANYL CITRATE (PF) 100 MCG/2ML IJ SOLN
INTRAMUSCULAR | Status: AC
  Filled 2023-04-12: qty 2

## 2023-04-12 MED ORDER — ROPIVACAINE HCL 2 MG/ML IJ SOLN
9.0000 mL | Freq: Once | INTRAMUSCULAR | Status: AC
  Administered 2023-04-12: 9 mL via PERINEURAL

## 2023-04-12 MED ORDER — LACTATED RINGERS IV SOLN: Freq: Once | INTRAVENOUS | Status: AC

## 2023-04-12 MED ORDER — ROPIVACAINE HCL 2 MG/ML IJ SOLN
INTRAMUSCULAR | Status: AC
  Filled 2023-04-12: qty 20

## 2023-04-12 MED ORDER — DEXAMETHASONE SODIUM PHOSPHATE 10 MG/ML IJ SOLN
INTRAMUSCULAR | Status: AC
  Filled 2023-04-12: qty 1

## 2023-04-12 MED ORDER — OXYCODONE-ACETAMINOPHEN 5-325 MG PO TABS
1.0000 | ORAL_TABLET | Freq: Three times a day (TID) | ORAL | 0 refills | Status: DC | PRN
Start: 2023-04-19 — End: 2023-04-28

## 2023-04-12 NOTE — Progress Notes (Signed)
PROVIDER NOTE: Interpretation of information contained herein should be left to medically-trained personnel. Specific patient instructions are provided elsewhere under "Patient Instructions" section of medical record. This document was created in part using STT-dictation technology, any transcriptional errors that may result from this process are unintentional.  Patient: Jose Washington Type: Established DOB: 17-Apr-1966 MRN: [id number] PCP: Larae Grooms, NP  Service: Procedure DOS: 04/12/2023 Setting: Ambulatory Location: Ambulatory outpatient facility Delivery: Face-to-face Provider: Oswaldo Done, MD Specialty: Interventional Pain Management Specialty designation: 09 Location: Outpatient facility Ref. Prov.: No ref. provider found       Interventional Therapy   Procedure: Cervical Facet, Medial Branch Radiofrequency Ablation (RFA)  #1  Laterality: Right (-RT)  Level: C3, C4, C5, C6, and C7 Medial Branch Level(s). Lesioning of these levels should completely denervate the C3-4, C4-5, C5-6, and C6-7 cervical facet joints.  Imaging: Fluoroscopy-guided         Anesthesia: Local anesthesia (1-2% Lidocaine) Anxiolysis: IV Versed         Sedation: Moderate Sedation                       DOS: 04/12/2023  Performed by: Oswaldo Done, MD  Purpose: Therapeutic/Palliative Indications: Chronic neck pain (cervicalgia) severe enough to impact quality of life or function. Rationale (medical necessity): procedure needed and proper for the diagnosis and/or treatment of Jose Washington medical symptoms and needs. Indications: 1. Cervical facet syndrome (Bilateral) (L>R)   2. Pain of cervical facet joint   3. Cervicalgia (Bilateral)   4. Spondylosis without myelopathy or radiculopathy, cervical region   5. Cervical DDD (C4-5, C5-6, C6-7 and C7-T1)    Jose Washington has been dealing with the above chronic pain for longer than three months and has either failed to respond, was unable to  tolerate, or simply did not get enough benefit from other more conservative therapies including, but not limited to: 1. Over-the-counter medications 2. Anti-inflammatory medications 3. Muscle relaxants 4. Membrane stabilizers 5. Opioids 6. Physical therapy and/or chiropractic manipulation 7. Modalities (Heat, ice, etc.) 8. Invasive techniques such as nerve blocks. Jose Washington has attained more than 50% relief of the pain from a series of diagnostic injections conducted in separate occasions.  Pain Score: Pre-procedure: 3 /10 Post-procedure: 0-No pain/10  Note: The patient indicates that lately he has been having a little bit more tension in the neck area and he has been experiencing symptoms bilaterally.    Target: Cervical medial nerve  Location: Posterolateral aspect of the waist of the cervical articular pillar. Region: Cervical  Approach: Paramedial  Type of procedure: Radiofrequency Neurolytic Ablation   Position / Prep / Materials:  Position: Prone with head of the table was raised to facilitate breathing.  Prep solution: DuraPrep (Iodine Povacrylex [0.7% available iodine] and Isopropyl Alcohol, 74% w/w) Prep Area: Entire Posterior Cervical Region  Materials:  Tray:  RFA (Radiofrequency) tray Needle(s):  Type: RFA (Teflon-coated radiofrequency ablation needles)  Gauge (G): 22  Length: Regular (10cm)  Qty: 5      Pre-op H&P Assessment:  Jose Washington is a 57 y.o. (year old), male patient, seen today for interventional treatment. He  has a past surgical history that includes eye muscle repair (1972 and 1975); bLEEDING ULCER (2012); Flexible sigmoidoscopy; Colonoscopy with propofol (N/A, 03/24/2021); and Esophagogastroduodenoscopy (egd) with propofol (N/A, 03/24/2021). Jose Washington has a current medication list which includes the following prescription(s): acetaminophen, prevagen extra strength, atenolol, atorvastatin, baclofen, buspirone, calcium carbonate, vitamin d3,  desloratadine-pseudoephedrine, diphenhydramine hcl,  docusate sodium, duloxetine, elderberry, fenofibrate, fluticasone, glucosamine-chondroit-vit c-mn, hydrocodone-acetaminophen, hydroxyzine, ibuprofen, levalbuterol, magnesium oxide, melatonin, methylsulfonylmethane, multivitamin, naloxone, omeprazole, ondansetron, oxycodone-acetaminophen, [START ON 04/19/2023] oxycodone-acetaminophen, potassium, simethicone, sucralfate, cyanocobalamin, vitamin e, hydrocodone-acetaminophen, and hydrocodone-acetaminophen, and the following Facility-Administered Medications: fentanyl. His primarily concern today is the Neck Pain  Initial Vital Signs:  Pulse/HCG Rate: (!) 58ECG Heart Rate: (!) 54 (SB) Temp: 98 F (36.7 C) Resp: 16 BP: 127/86 SpO2: 100 %  BMI: Estimated body mass index is 26.63 kg/m as calculated from the following:   Height as of this encounter: 5\' 7"  (1.702 m).   Weight as of this encounter: 170 lb (77.1 kg).  Risk Assessment: Allergies: Reviewed. He has No Known Allergies.  Allergy Precautions: None required Coagulopathies: Reviewed. None identified.  Blood-thinner therapy: None at this time Active Infection(s): Reviewed. None identified. Jose Washington is afebrile  Site Confirmation: Jose Washington was asked to confirm the procedure and laterality before marking the site Procedure checklist: Completed Consent: Before the procedure and under the influence of no sedative(s), amnesic(s), or anxiolytics, the patient was informed of the treatment options, risks and possible complications. To fulfill our ethical and legal obligations, as recommended by the American Medical Association's Code of Ethics, I have informed the patient of my clinical impression; the nature and purpose of the treatment or procedure; the risks, benefits, and possible complications of the intervention; the alternatives, including doing nothing; the risk(s) and benefit(s) of the alternative treatment(s) or procedure(s); and the  risk(s) and benefit(s) of doing nothing. The patient was provided information about the general risks and possible complications associated with the procedure. These may include, but are not limited to: failure to achieve desired goals, infection, bleeding, organ or nerve damage, allergic reactions, paralysis, and death. In addition, the patient was informed of those risks and complications associated to Spine-related procedures, such as failure to decrease pain; infection (i.e.: Meningitis, epidural or intraspinal abscess); bleeding (i.e.: epidural hematoma, subarachnoid hemorrhage, or any other type of intraspinal or peri-dural bleeding); organ or nerve damage (i.e.: Any type of peripheral nerve, nerve root, or spinal cord injury) with subsequent damage to sensory, motor, and/or autonomic systems, resulting in permanent pain, numbness, and/or weakness of one or several areas of the body; allergic reactions; (i.e.: anaphylactic reaction); and/or death. Furthermore, the patient was informed of those risks and complications associated with the medications. These include, but are not limited to: allergic reactions (i.e.: anaphylactic or anaphylactoid reaction(s)); adrenal axis suppression; blood sugar elevation that in diabetics may result in ketoacidosis or comma; water retention that in patients with history of congestive heart failure may result in shortness of breath, pulmonary edema, and decompensation with resultant heart failure; weight gain; swelling or edema; medication-induced neural toxicity; particulate matter embolism and blood vessel occlusion with resultant organ, and/or nervous system infarction; and/or aseptic necrosis of one or more joints. Finally, the patient was informed that Medicine is not an exact science; therefore, there is also the possibility of unforeseen or unpredictable risks and/or possible complications that may result in a catastrophic outcome. The patient indicated having  understood very clearly. We have given the patient no guarantees and we have made no promises. Enough time was given to the patient to ask questions, all of which were answered to the patient's satisfaction. Mr. Quiros has indicated that he wanted to continue with the procedure. Attestation: I, the ordering provider, attest that I have discussed with the patient the benefits, risks, side-effects, alternatives, likelihood of achieving goals, and potential problems during recovery for  the procedure that I have provided informed consent. Date  Time: 04/12/2023  7:53 AM   Pre-Procedure Preparation:  Monitoring: As per clinic protocol. Respiration, ETCO2, SpO2, BP, heart rate and rhythm monitor placed and checked for adequate function Safety Precautions: Patient was assessed for positional comfort and pressure points before starting the procedure. Time-out: I initiated and conducted the "Time-out" before starting the procedure, as per protocol. The patient was asked to participate by confirming the accuracy of the "Time Out" information. Verification of the correct person, site, and procedure were performed and confirmed by me, the nursing staff, and the patient. "Time-out" conducted as per Joint Commission's Universal Protocol (UP.01.01.01). Time: 9604 Start Time: 0838 hrs.  Description/Narrative of Procedure:          Start Time: 0838 hrs. Rationale (medical necessity): procedure needed and proper for the diagnosis and/or treatment of the patient's medical symptoms and needs. Procedural Technique Safety Precautions: Aspiration looking for blood return was conducted prior to all injections. At no point did we inject any substances, as a needle was being advanced. No attempts were made at seeking any paresthesias. Safe injection practices and needle disposal techniques used. Medications properly checked for expiration dates. SDV (single dose vial) medications used. Description of the Procedure: Protocol  guidelines were followed. The patient was assisted into a comfortable position. The target area was identified and the area prepped in the usual manner. Skin & deeper tissues infiltrated with local anesthetic. Appropriate amount of time allowed to pass for local anesthetics to take effect. The procedure needles were then advanced to the target area. Proper needle placement secured. Negative aspiration confirmed. Solution injected in intermittent fashion, asking for systemic symptoms every 0.5cc of injectate. The needles were then removed and the area cleansed, making sure to leave some of the prepping solution back to take advantage of its long term bactericidal properties.  Technical description of procedure:  Laterality: See above. Radiofrequency Ablation (RFA) C3 Medial Branch Nerve RFA: The target area for the C3 dorsal medial articular branch is the lateral concave waist of the articular pillar of C3. Under fluoroscopic guidance, a Radiofrequency needle was inserted until contact was made with os over the postero-lateral aspect of the articular pillar of C3 (target area). Sensory and motor testing was conducted to properly adjust the position of the needle. Once satisfactory placement of the needle was achieved, the numbing solution was slowly injected after negative aspiration for blood. 2.0 mL of the nerve block solution was injected without difficulty or complication. After waiting for at least 3 minutes, the ablation was performed. Once completed, the needle was removed intact. C4 Medial Branch Nerve RFA: The target area for the C4 dorsal medial articular branch is the lateral concave waist of the articular pillar of C4. Under fluoroscopic guidance, a Radiofrequency needle was inserted until contact was made with os over the postero-lateral aspect of the articular pillar of C4 (target area). Sensory and motor testing was conducted to properly adjust the position of the needle. Once satisfactory  placement of the needle was achieved, the numbing solution was slowly injected after negative aspiration for blood. 2.0 mL of the nerve block solution was injected without difficulty or complication. After waiting for at least 3 minutes, the ablation was performed. Once completed, the needle was removed intact. C5 Medial Branch Nerve RFA: The target area for the C5 dorsal medial articular branch is the lateral concave waist of the articular pillar of C5. Under fluoroscopic guidance, a Radiofrequency needle was inserted  until contact was made with os over the postero-lateral aspect of the articular pillar of C5 (target area). Sensory and motor testing was conducted to properly adjust the position of the needle. Once satisfactory placement of the needle was achieved, the numbing solution was slowly injected after negative aspiration for blood. 2.0 mL of the nerve block solution was injected without difficulty or complication. After waiting for at least 3 minutes, the ablation was performed. Once completed, the needle was removed intact. C6 Medial Branch Nerve RFA: The target area for the C6 dorsal medial articular branch is the lateral concave waist of the articular pillar of C6. Under fluoroscopic guidance, a Radiofrequency needle was inserted until contact was made with os over the postero-lateral aspect of the articular pillar of C6 (target area). Sensory and motor testing was conducted to properly adjust the position of the needle. Once satisfactory placement of the needle was achieved, the numbing solution was slowly injected after negative aspiration for blood. 2.0 mL of the nerve block solution was injected without difficulty or complication. After waiting for at least 3 minutes, the ablation was performed. Once completed, the needle was removed intact. C7 Medial Branch Nerve RFA: The target for the C7 dorsal medial articular branch lies on the superior-medial tip of the C7 transverse process. Under  fluoroscopic guidance, a Radiofrequency needle was inserted until contact was made with os over the postero-lateral aspect of the articular pillar of C7 (target area). Sensory and motor testing was conducted to properly adjust the position of the needle. Once satisfactory placement of the needle was achieved, the numbing solution was slowly injected after negative aspiration for blood. 2.0 mL of the nerve block solution was injected without difficulty or complication. After waiting for at least 3 minutes, the ablation was performed. Once completed, the needle was removed intact. Radiofrequency lesioning (ablation):  Radiofrequency Generator: Medtronic AccurianTM AG 1000 RF Generator Sensory Stimulation Parameters: 50 Hz was used to locate & identify the nerve, making sure that the needle was positioned such that there was no sensory stimulation below 0.3 V or above 0.7 V. Motor Stimulation Parameters: 2 Hz was used to evaluate the motor component. Care was taken not to lesion any nerves that demonstrated motor stimulation of the lower extremities at an output of less than 2.5 times that of the sensory threshold, or a maximum of 2.0 V. Lesioning Technique Parameters: Standard Radiofrequency settings. (Not bipolar or pulsed.) Temperature Settings: 80 degrees C Lesioning time: 60 seconds Intra-operative Compliance: Compliant   Once the entire procedure was completed, the treated area was cleaned, making sure to leave some of the prepping solution back to take advantage of its long term bactericidal properties. Intra-operative Compliance: Compliant  Anatomy Reference Guide:         Facet Joint Innervation  C1-2 Third occipital Nerve (TON)  C2-3 TON, C3  Medial Branch  C3-4 C3, C4         "          "  C4-5 C4, C5         "          "  C5-6 C5, C6         "          "  C6-7 C6, C7         "          "  C7-T1 C7, C8         "          "  Cervical Facet Pain Pattern overlap:   Vitals:    04/12/23 0919 04/12/23 0927 04/12/23 0937 04/12/23 0948  BP: (!) 144/113 (!) 141/88 127/87 125/85  Pulse:      Resp:  13 14 19   Temp:  (!) 97.3 F (36.3 C)    TempSrc:  Temporal    SpO2: 95% 99% 100% 100%  Weight:      Height:         End Time: 0919 hrs.  Imaging Guidance (Spinal):          Type of Imaging Technique: Fluoroscopy Guidance (Spinal) Indication(s): Assistance in needle guidance and placement for procedures requiring needle placement in or near specific anatomical locations not easily accessible without such assistance. Exposure Time: Please see nurses notes. Contrast: None used. Fluoroscopic Guidance: I was personally present during the use of fluoroscopy. "Tunnel Vision Technique" used to obtain the best possible view of the target area. Parallax error corrected before commencing the procedure. "Direction-depth-direction" technique used to introduce the needle under continuous pulsed fluoroscopy. Once target was reached, antero-posterior, oblique, and lateral fluoroscopic projection used confirm needle placement in all planes. Images permanently stored in EMR. Interpretation: No contrast injected. I personally interpreted the imaging intraoperatively. Adequate needle placement confirmed in multiple planes. Permanent images saved into the patient's record.  Post-operative Assessment:  Post-procedure Vital Signs:  Pulse/HCG Rate: (!) 58(!) 58 Temp: (!) 97.3 F (36.3 C) Resp: 19 BP: 125/85 SpO2: 100 %  EBL: None  Complications: No immediate post-treatment complications observed by team, or reported by patient.  Note: The patient tolerated the entire procedure well. A repeat set of vitals were taken after the procedure and the patient was kept under observation following institutional policy, for this type of procedure. Post-procedural neurological assessment was performed, showing return to baseline, prior to discharge. The patient was provided with post-procedure  discharge instructions, including a section on how to identify potential problems. Should any problems arise concerning this procedure, the patient was given instructions to immediately contact us, at any time, without hesitation. In any case, we plan to contact the patient by telephone for a follow-up status report regarding this interventional procedure.  Comments:  No additional relevant information.  Plan of Care (POC)  Orders:  Orders Placed This Encounter  Procedures   Radiofrequency,Cervical    Scheduling Instructions:     Side(s): Right-sided     Level(s): C3, C4, C5, C6, and C7 Medial Branch Nerves     Sedation: With Sedation.     Timeframe: Today    Order Specific Question:   Where will this procedure be performed?    Answer:   [facility]   [facility] C-ARM 1-60 MIN NO REPORT    Intraoperative interpretation by procedural physician at [facility].    Standing Status:   Standing    Number of Occurrences:   1    Order Specific Question:   Reason for exam:    Answer:   Assistance in needle guidance and placement for procedures requiring needle placement in or near specific anatomical locations not easily accessible without such assistance.   Informed Consent Details: Physician/Practitioner Attestation; Transcribe to consent form and obtain patient signature    Nursing Order: Transcribe to consent form and obtain patient signature. Note: Always confirm laterality of pain with Mr. Brockmann, before procedure.    Order Specific Question:   Physician/Practitioner attestation of informed consent for procedure/surgical case    Answer:   I, the physician/practitioner, attest that  I have discussed with the patient the benefits, risks, side effects, alternatives, likelihood of achieving goals and potential problems during recovery for the procedure that I have provided informed consent.    Order Specific Question:   Procedure    Answer:   Cervical Facet Radiofrequency  Ablation    Order Specific Question:   Physician/Practitioner performing the procedure    Answer:   Case Vassell A. Laban Emperor, MD    Order Specific Question:   Indication/Reason    Answer:   Neck Pain (Cervicalgia), with our without referred arm pain, due to Facet Joint Arthralgia (Joint Pain) known as Cervical Facet Syndrome, secondary to Cervical, and/or Cervico-thoracic Spondylosis (Arthritis of the Spine), without myelopathy or radiculop   Provide equipment / supplies at bedside    Procedure tray: "Radiofrequency Tray" Additional material: Large hemostat (x1); Small hemostat (x1); Towels (x8); 4x4 sterile sponge pack (x1) Needle type: Teflon-coated Radiofrequency Needle (Disposable  single use) Size: Regular Quantity: 5    Standing Status:   Standing    Number of Occurrences:   1    Order Specific Question:   Specify    Answer:   Radiofrequency Tray   Follow-up    Schedule Mr. Spizzirri for a post-procedure follow-up evaluation encounter 6 weeks from now.    Standing Status:   Future    Standing Expiration Date:   05/24/2023    Scheduling Instructions:     Schedule follow-up visit on afternoon of procedure day (T, Th)     Type: Face-to-face (F2F) Post-procedure (PP) evaluation (E/M)     When: 6 weeks from now   Chronic Opioid Analgesic:  Hydrocodone/APAP 5/325, 1 tab PO QD (5 mg/day of hydrocodone) MME/day: 5 mg/day.   Medications ordered for procedure: Meds ordered this encounter  Medications   lidocaine (XYLOCAINE) 2 % (with pres) injection 400 mg   pentafluoroprop-tetrafluoroeth (GEBAUERS) aerosol   lactated ringers infusion   midazolam (VERSED) 5 MG/5ML injection 0.5-2 mg    Make sure Flumazenil is available in the pyxis when using this medication. If oversedation occurs, administer 0.2 mg IV over 15 sec. If after 45 sec no response, administer 0.2 mg again over 1 min; may repeat at 1 min intervals; not to exceed 4 doses (1 mg)   fentaNYL (SUBLIMAZE) injection 25-50 mcg    Make  sure Narcan is available in the pyxis when using this medication. In the event of respiratory depression (RR< 8/min): Titrate NARCAN (naloxone) in increments of 0.1 to 0.2 mg IV at 2-3 minute intervals, until desired degree of reversal.   ropivacaine (PF) 2 mg/mL (0.2%) (NAROPIN) injection 9 mL   dexamethasone (DECADRON) injection 10 mg   oxyCODONE-acetaminophen (PERCOCET) 5-325 MG tablet    Sig: Take 1 tablet by mouth every 8 (eight) hours as needed for up to 7 days for severe pain. Must last 7 days.    Dispense:  21 tablet    Refill:  0    For acute post-operative pain. Not to be refilled.  Must last 7 days.   oxyCODONE-acetaminophen (PERCOCET) 5-325 MG tablet    Sig: Take 1 tablet by mouth every 8 (eight) hours as needed for up to 7 days for severe pain. Must last 7 days.    Dispense:  21 tablet    Refill:  0    For acute post-operative pain. Not to be refilled.  Must last 7 days.   Medications administered: We administered lidocaine, pentafluoroprop-tetrafluoroeth, lactated ringers, midazolam, fentaNYL, ropivacaine (PF) 2 mg/mL (0.2%), and dexamethasone.  See the medical record for exact dosing, route, and time of administration.  Follow-up plan:   Return in about 6 weeks (around 05/24/2023) for Proc-day (T,Th), (Face2F), (PPE).       Interventional Therapies  Risk Factors  Considerations:   Abnormal UDS (03/27/2020) & (08/20/20) (+) undisclosed carboxy-THC    Planned  Pending:   Therapeutic right cervical facet RFA #1    Under consideration:   Therapeutic right IA hip joint injection #2  Therapeutic right lumbar facet RFA #2  Therapeutic right cervical facet RFA #1    Completed:   Diagnostic/therapeutic right IA hip inj. x1 (12/09/2020) (100/100/95/90-95)  Therapeutic right lumbar facet RFA x2 (11/26/2021) (100/100/75/75)  Palliative right lumbar facet MBB x2 (06/07/2019) (100/100/100 x20 days/>75 x3 months)  Diagnostic/therapeutic left lumbar facet MBB x1 (06/07/2019)  (100/100/100 x20 days/>75 x3 months)  Diagnostic/therapeutic left L4-5 LESI x2 (07/25/2018) (n/a)  Palliative left CESI x3 (02/16/2018) (100/100/100/100)  Diagnostic right cervical facet MBB x2 (08/19/2022) (100/100/100/100) (w/ local anesthetic only)  Diagnostic left cervical facet MBB x1 (01/10/2020) (100/100/100)  Diagnostic/therapeutic right shoulder region TPI/MNB x2 (07/19/2022) (100/100/100/100)    Therapeutic  Palliative (PRN) options:   Therapeutic lumbar facet RFA   Therapeutic/palliative lumbar facet MBB   Therapeutic L4-5 LESI   Therapeutic CESI   Therapeutic right IA hip joint injection #2  Diagnostic cervical facet MBB #2    Pharmacotherapy  Nonopioids transferred 08/20/2020: Baclofen        Recent Visits Date Type Provider Dept  02/02/23 Office Visit Delano Metz, MD [facility]  Showing recent visits within past 90 days and meeting all other requirements Today's Visits Date Type Provider Dept  04/12/23 Procedure visit Delano Metz, MD [facility]  Showing today's visits and meeting all other requirements Future Appointments Date Type Provider Dept  05/02/23 Appointment Delano Metz, MD [facility]  05/24/23 Appointment Delano Metz, MD [facility]  Showing future appointments within next 90 days and meeting all other requirements  Disposition: Discharge home  Discharge (Date  Time): 04/12/2023; 0957 hrs.   Primary Care Physician: Larae Grooms, NP Location: [facility] Note by: Oswaldo Done, MD (TTS technology used. I apologize for any typographical errors that were not detected and corrected.) Date: 04/12/2023; Time: 10:05 AM  Disclaimer:  Medicine is not an Visual merchandiser. The only guarantee in medicine is that nothing is guaranteed. It is important to note that the decision to proceed with this intervention was based on the information collected from the  patient. The Data and conclusions were drawn from the patient's questionnaire, the interview, and the physical examination. Because the information was provided in large part by the patient, it cannot be guaranteed that it has not been purposely or unconsciously manipulated. Every effort has been made to obtain as much relevant data as possible for this evaluation. It is important to note that the conclusions that lead to this procedure are derived in large part from the available data. Always take into account that the treatment will also be dependent on availability of resources and existing treatment guidelines, considered by other Pain Management Practitioners as being common knowledge and practice, at the time of the intervention. For Medico-Legal purposes, it is also important to point out that variation in procedural techniques and pharmacological choices are the acceptable norm. The indications, contraindications, technique, and results of the above procedure should only be interpreted and judged by a Board-Certified Interventional Pain Specialist with extensive familiarity and expertise in  the same exact procedure and technique.

## 2023-04-12 NOTE — Progress Notes (Signed)
Safety precautions to be maintained throughout the outpatient stay will include: orient to surroundings, keep bed in low position, maintain call bell within reach at all times, provide assistance with transfer out of bed and ambulation.  

## 2023-04-12 NOTE — Patient Instructions (Signed)
___________________________________________________________________________________________  Post-Radiofrequency (RF) Discharge Instructions  You have just completed a Radiofrequency Neurotomy.  The following instructions will provide you with information and guidelines for self-care upon discharge.  If at any time you have questions or concerns please call your physician. DO NOT DRIVE YOURSELF!!  Instructions: Apply ice: Fill a plastic sandwich bag with crushed ice. Cover it with a small towel and apply to injection site. Apply for 15 minutes then remove x 15 minutes. Repeat sequence on day of procedure, until you go to bed. The purpose is to minimize swelling and discomfort after procedure. Apply heat: Apply heat to procedure site starting the day following the procedure. The purpose is to treat any soreness and discomfort from the procedure. Food intake: No eating limitations, unless stipulated above.  Nevertheless, if you have had sedation, you may experience some nausea.  In this case, it may be wise to wait at least two hours prior to resuming regular diet. Physical activities: Keep activities to a minimum for the first 8 hours after the procedure. For the first 24 hours after the procedure, do not drive a motor vehicle,  Operate heavy machinery, power tools, or handle any weapons.  Consider walking with the use of an assistive device or accompanied by an adult for the first 24 hours.  Do not drink alcoholic beverages including beer.  Do not make any important decisions or sign any legal documents. Go home and rest today.  Resume activities tomorrow, as tolerated.  Use caution in moving about as you may experience mild leg weakness.  Use caution in cooking, use of household electrical appliances and climbing steps. Driving: If you have received any sedation, you are not allowed to drive for 24 hours after your procedure. Blood thinner: Restart your blood thinner 6 hours after your procedure.  (Only for those taking blood thinners) Insulin: As soon as you can eat, you may resume your normal dosing schedule. (Only for those taking insulin) Medications: May resume pre-procedure medications.  Do not take any drugs, other than what has been prescribed to you. Infection prevention: Keep procedure site clean and dry. Post-procedure Pain Diary: Extremely important that this be done correctly and accurately. Recorded information will be used to determine the next step in treatment. Pain evaluated is that of treated area only. Do not include pain from an untreated area. Complete every hour, on the hour, for the initial 8 hours. Set an alarm to help you do this part accurately. Do not go to sleep and have it completed later. It will not be accurate. Follow-up appointment: Keep your follow-up appointment after the procedure. Usually 2-6 weeks after radiofrequency. Bring you pain diary. The information collected will be essential for your long-term care.   Expect: From numbing medicine (AKA: Local Anesthetics): Numbness or decrease in pain. Onset: Full effect within 15 minutes of injected. Duration: It will depend on the type of local anesthetic used. On the average, 1 to 8 hours.  From steroids (when added): Decrease in swelling or inflammation. Once inflammation is improved, relief of the pain will follow. Onset of benefits: Depends on the amount of swelling present. The more swelling, the longer it will take for the benefits to be seen. In some cases, up to 10 days. Duration: Steroids will stay in the system x 2 weeks. Duration of benefits will depend on multiple posibilities including persistent irritating factors. From procedure: Some discomfort is to be expected once the numbing medicine wears off. In the case of radiofrequency procedures,   this may last as long as 6 weeks. Additional post-procedure pain medication is provided for this. Discomfort is minimized if ice and heat are applied as  instructed.  Call if: You experience numbness and weakness that gets worse with time, as opposed to wearing off. He experience any unusual bleeding, difficulty breathing, or loss of the ability to control your bowel and bladder. (This applies to Spinal procedures only) You experience any redness, swelling, heat, red streaks, elevated temperature, fever, or any other signs of a possible infection.  Emergency Numbers: Durning business hours (Monday - Thursday, 8:00 AM - 4:00 PM) (Friday, 9:00 AM - 12:00 Noon): [phone] After hours: [phone] ____________________________________________________________________________________________   ______________________________________________________________________  Procedure instructions  Do not eat or drink fluids (other than water) for 6 hours before your procedure  No water for 2 hours before your procedure  Take your blood pressure medicine with a sip of water  Arrive 30 minutes before your appointment  Carefully read the "Preparing for your procedure" detailed instructions  If you have questions call us at [phone]  _____________________________________________________________________    ______________________________________________________________________  Preparing for your procedure  Appointments: If you think you may not be able to keep your appointment, call 24-48 hours in advance to cancel. We need time to make it available to others.  During your procedure appointment there will be: No Prescription Refills. No disability issues to discussed. No medication changes or discussions.  Instructions: Food intake: Avoid eating anything solid for at least 8 hours prior to your procedure. Clear liquid intake: You may take clear liquids such as water up to 2 hours prior to your procedure. (No carbonated drinks. No soda.) Transportation: Unless otherwise stated by your physician, bring a driver. Morning  Medicines: Except for blood thinners, take all of your other morning medications with a sip of water. Make sure to take your heart and blood pressure medicines. If your blood pressure's lower number is above 100, the case will be rescheduled. Blood thinners: Make sure to stop your blood thinners as instructed.  If you take a blood thinner, but were not instructed to stop it, call our office [phone] and ask to talk to a nurse. Not stopping a blood thinner prior to certain procedures could lead to serious complications. Diabetics on insulin: Notify the staff so that you can be scheduled 1st case in the morning. If your diabetes requires high dose insulin, take only  of your normal insulin dose the morning of the procedure and notify the staff that you have done so. Preventing infections: Shower with an antibacterial soap the morning of your procedure.  Build-up your immune system: Take 1000 mg of Vitamin C with every meal (3 times a day) the day prior to your procedure. Antibiotics: Inform the nursing staff if you are taking any antibiotics or if you have any conditions that may require antibiotics prior to procedures. (Example: recent joint implants)   Pregnancy: If you are pregnant make sure to notify the nursing staff. Not doing so may result in injury to the fetus, including death.  Sickness: If you have a cold, fever, or any active infections, call and cancel or reschedule your procedure. Receiving steroids while having an infection may result in complications. Arrival: You must be in the facility at least 30 minutes prior to your scheduled procedure. Tardiness: Your scheduled time is also the cutoff time. If you do not arrive at least 15 minutes prior to your procedure, you will be rescheduled.    Children: Do not bring any children with you. Make arrangements to keep them home. Dress appropriately: There is always a possibility that your clothing may get soiled. Avoid long dresses. Valuables:  Do not bring any jewelry or valuables.  Reasons to call and reschedule or cancel your procedure: (Following these recommendations will minimize the risk of a serious complication.) Surgeries: Avoid having procedures within 2 weeks of any surgery. (Avoid for 2 weeks before or after any surgery). Flu Shots: Avoid having procedures within 2 weeks of a flu shots or . (Avoid for 2 weeks before or after immunizations). Barium: Avoid having a procedure within 7-10 days after having had a radiological study involving the use of radiological contrast. (Myelograms, Barium swallow or enema study). Heart attacks: Avoid any elective procedures or surgeries for the initial 6 months after a "Myocardial Infarction" (Heart Attack). Blood thinners: It is imperative that you stop these medications before procedures. Let us know if you if you take any blood thinner.  Infection: Avoid procedures during or within two weeks of an infection (including chest colds or gastrointestinal problems). Symptoms associated with infections include: Localized redness, fever, chills, night sweats or profuse sweating, burning sensation when voiding, cough, congestion, stuffiness, runny nose, sore throat, diarrhea, nausea, vomiting, cold or Flu symptoms, recent or current infections. It is specially important if the infection is over the area that we intend to treat. Heart and lung problems: Symptoms that may suggest an active cardiopulmonary problem include: cough, chest pain, breathing difficulties or shortness of breath, dizziness, ankle swelling, uncontrolled high or unusually low blood pressure, and/or palpitations. If you are experiencing any of these symptoms, cancel your procedure and contact your primary care physician for an evaluation.  Remember:  Regular Business hours are:  Monday to Thursday 8:00 AM to 4:00 PM  Provider's Schedule: Oluwatomisin Deman, MD:  Procedure days: Tuesday and Thursday 7:30 AM to 4:00 PM  Bilal  Lateef, MD:  Procedure days: Monday and Wednesday 7:30 AM to 4:00 PM  ______________________________________________________________________    ____________________________________________________________________________________________  General Risks and Possible Complications  Patient Responsibilities: It is important that you read this as it is part of your informed consent. It is our duty to inform you of the risks and possible complications associated with treatments offered to you. It is your responsibility as a patient to read this and to ask questions about anything that is not clear or that you believe was not covered in this document.  Patient's Rights: You have the right to refuse treatment. You also have the right to change your mind, even after initially having agreed to have the treatment done. However, under this last option, if you wait until the last second to change your mind, you may be charged for the materials used up to that point.  Introduction: Medicine is not an exact science. Everything in Medicine, including the lack of treatment(s), carries the potential for danger, harm, or loss (which is by definition: Risk). In Medicine, a complication is a secondary problem, condition, or disease that can aggravate an already existing one. All treatments carry the risk of possible complications. The fact that a side effects or complications occurs, does not imply that the treatment was conducted incorrectly. It must be clearly understood that these can happen even when everything is done following the highest safety standards.  No treatment: You can choose not to proceed with the proposed treatment alternative. The "PRO(s)" would include: avoiding the risk of complications associated with the therapy. The "CON(s)" would   include: not getting any of the treatment benefits. These benefits fall under one of three categories: diagnostic; therapeutic; and/or palliative. Diagnostic benefits  include: getting information which can ultimately lead to improvement of the disease or symptom(s). Therapeutic benefits are those associated with the successful treatment of the disease. Finally, palliative benefits are those related to the decrease of the primary symptoms, without necessarily curing the condition (example: decreasing the pain from a flare-up of a chronic condition, such as incurable terminal cancer).  General Risks and Complications: These are associated to most interventional treatments. They can occur alone, or in combination. They fall under one of the following six (6) categories: no benefit or worsening of symptoms; bleeding; infection; nerve damage; allergic reactions; and/or death. No benefits or worsening of symptoms: In Medicine there are no guarantees, only probabilities. No healthcare provider can ever guarantee that a medical treatment will work, they can only state the probability that it may. Furthermore, there is always the possibility that the condition may worsen, either directly, or indirectly, as a consequence of the treatment. Bleeding: This is more common if the patient is taking a blood thinner, either prescription or over the counter (example: Goody Powders, Fish oil, Aspirin, Garlic, etc.), or if suffering a condition associated with impaired coagulation (example: Hemophilia, cirrhosis of the liver, low platelet counts, etc.). However, even if you do not have one on these, it can still happen. If you have any of these conditions, or take one of these drugs, make sure to notify your treating physician. Infection: This is more common in patients with a compromised immune system, either due to disease (example: diabetes, cancer, human immunodeficiency virus [HIV], etc.), or due to medications or treatments (example: therapies used to treat cancer and rheumatological diseases). However, even if you do not have one on these, it can still happen. If you have any of these  conditions, or take one of these drugs, make sure to notify your treating physician. Nerve Damage: This is more common when the treatment is an invasive one, but it can also happen with the use of medications, such as those used in the treatment of cancer. The damage can occur to small secondary nerves, or to large primary ones, such as those in the spinal cord and brain. This damage may be temporary or permanent and it may lead to impairments that can range from temporary numbness to permanent paralysis and/or brain death. Allergic Reactions: Any time a substance or material comes in contact with our body, there is the possibility of an allergic reaction. These can range from a mild skin rash (contact dermatitis) to a severe systemic reaction (anaphylactic reaction), which can result in death. Death: In general, any medical intervention can result in death, most of the time due to an unforeseen complication. ____________________________________________________________________________________________    

## 2023-04-13 ENCOUNTER — Telehealth: Payer: Self-pay | Admitting: *Deleted

## 2023-04-13 NOTE — Telephone Encounter (Signed)
No problems post procedure. 

## 2023-04-20 ENCOUNTER — Ambulatory Visit: Payer: No Typology Code available for payment source | Admitting: Family Medicine

## 2023-04-20 ENCOUNTER — Encounter: Payer: Self-pay | Admitting: Family Medicine

## 2023-04-20 VITALS — BP 129/83 | HR 69 | Temp 99.0°F | Ht 67.0 in | Wt 183.2 lb

## 2023-04-20 DIAGNOSIS — M5030 Other cervical disc degeneration, unspecified cervical region: Secondary | ICD-10-CM

## 2023-04-20 NOTE — Assessment & Plan Note (Signed)
Continue to follow with physical therapy and pain management. Light duty note provided. Discussed that if he needs ADA accomodation we do not do that in this office and he would need to get it from specialist. Follow up with PCP as scheduled. Call with any concerns.

## 2023-04-20 NOTE — Progress Notes (Signed)
BP 129/83   Pulse 69   Temp 99 F (37.2 C) (Oral)   Ht 5\' 7"  (1.702 m)   Wt 183 lb 3.2 oz (83.1 kg)   SpO2 99%   BMI 28.69 kg/m    Subjective:    Patient ID: Jose Washington, male    DOB: [birth year], 57 y.o.   MRN: [id number]  HPI: Jose Washington is a 57 y.o. male  Chief Complaint  Patient presents with   Cervical DJD    Patient says he has been seeing a Pain Specialist and had some new diagnosis and is requested a light duty note, as he is not wanting to be out of work completely. Patient says he was told he has arm weakness, sharp pain in his neck. Patient says the Pain Specialist will treat it and says the specialist does not write light duty notes for patients.    NECK PAIN FOLLOW UP Diagnosis: Cervical DJD Status: stable Treatments attempted: injections, physical therapy, chiropractry, medications, radiofrequency ablation Compliant with recommended treatment: yes Relief with NSAIDs?:  mild Location: neck and bilateral arms Duration:chronic Severity: moderate Quality: aching and sore Frequency: intermittent with work Radiation:  bilateral arms Aggravating factors: working Alleviating factors: medicine, injections, chiropractry Weakness:  yes Paresthesias / decreased sensation:  yes  Fevers:  no  Relevant past medical, surgical, family and social history reviewed and updated as indicated. Interim medical history since our last visit reviewed. Allergies and medications reviewed and updated.  Review of Systems  Constitutional: Negative.   Respiratory: Negative.    Cardiovascular: Negative.   Gastrointestinal: Negative.   Musculoskeletal:  Positive for myalgias, neck pain and neck stiffness. Negative for arthralgias, back pain, gait problem and joint swelling.  Neurological:  Positive for weakness. Negative for dizziness, tremors, seizures, syncope, facial asymmetry, speech difficulty, light-headedness, numbness and headaches.  Psychiatric/Behavioral:  Negative.      Per HPI unless specifically indicated above     Objective:    BP 129/83   Pulse 69   Temp 99 F (37.2 C) (Oral)   Ht 5\' 7"  (1.702 m)   Wt 183 lb 3.2 oz (83.1 kg)   SpO2 99%   BMI 28.69 kg/m   Wt Readings from Last 3 Encounters:  04/20/23 183 lb 3.2 oz (83.1 kg)  04/12/23 170 lb (77.1 kg)  02/22/23 175 lb 4.8 oz (79.5 kg)    Physical Exam Vitals and nursing note reviewed.  Constitutional:      General: He is not in acute distress.    Appearance: Normal appearance. He is normal weight. He is not ill-appearing, toxic-appearing or diaphoretic.  HENT:     Head: Normocephalic and atraumatic.     Right Ear: External ear normal.     Left Ear: External ear normal.     Nose: Nose normal.     Mouth/Throat:     Mouth: Mucous membranes are moist.     Pharynx: Oropharynx is clear.  Eyes:     General: No scleral icterus.       Right eye: No discharge.        Left eye: No discharge.     Extraocular Movements: Extraocular movements intact.     Conjunctiva/sclera: Conjunctivae normal.     Pupils: Pupils are equal, round, and reactive to light.  Cardiovascular:     Rate and Rhythm: Normal rate and regular rhythm.     Pulses: Normal pulses.     Heart sounds: Normal heart sounds. No murmur heard.  No friction rub. No gallop.  Pulmonary:     Effort: Pulmonary effort is normal. No respiratory distress.     Breath sounds: Normal breath sounds. No stridor. No wheezing, rhonchi or rales.  Chest:     Chest wall: No tenderness.  Musculoskeletal:        General: Normal range of motion.     Cervical back: Normal range of motion and neck supple.  Skin:    General: Skin is warm and dry.     Capillary Refill: Capillary refill takes less than 2 seconds.     Coloration: Skin is not jaundiced or pale.     Findings: No bruising, erythema, lesion or rash.  Neurological:     General: No focal deficit present.     Mental Status: He is alert and oriented to person, place, and  time. Mental status is at baseline.  Psychiatric:        Mood and Affect: Mood normal.        Behavior: Behavior normal.        Thought Content: Thought content normal.        Judgment: Judgment normal.     Results for orders placed or performed in visit on 02/22/23  Microscopic Examination   Urine  Result Value Ref Range   WBC, UA 0-5 0 - 5 /hpf   RBC, Urine None seen 0 - 2 /hpf   Epithelial Cells (non renal) None seen 0 - 10 /hpf   Mucus, UA Present (A) Not Estab.   Bacteria, UA None seen None seen/Few  TSH  Result Value Ref Range   TSH 1.260 0.450 - 4.500 uIU/mL  PSA  Result Value Ref Range   Prostate Specific Ag, Serum 0.7 0.0 - 4.0 ng/mL  Lipid panel  Result Value Ref Range   Cholesterol, Total 166 100 - 199 mg/dL   Triglycerides 161 0 - 149 mg/dL   HDL 42 >09 mg/dL   VLDL Cholesterol Cal 19 5 - 40 mg/dL   LDL Chol Calc (NIH) 604 (H) 0 - 99 mg/dL   Chol/HDL Ratio 4.0 0.0 - 5.0 ratio  CBC with Differential/Platelet  Result Value Ref Range   WBC 7.6 3.4 - 10.8 x10E3/uL   RBC 4.99 4.14 - 5.80 x10E6/uL   Hemoglobin 14.6 13.0 - 17.7 g/dL   Hematocrit 54.0 98.1 - 51.0 %   MCV 89 79 - 97 fL   MCH 29.3 26.6 - 33.0 pg   MCHC 33.0 31.5 - 35.7 g/dL   RDW 19.1 47.8 - 29.5 %   Platelets 346 150 - 450 x10E3/uL   Neutrophils 67 Not Estab. %   Lymphs 21 Not Estab. %   Monocytes 6 Not Estab. %   Eos 5 Not Estab. %   Basos 1 Not Estab. %   Neutrophils Absolute 5.0 1.4 - 7.0 x10E3/uL   Lymphocytes Absolute 1.6 0.7 - 3.1 x10E3/uL   Monocytes Absolute 0.5 0.1 - 0.9 x10E3/uL   EOS (ABSOLUTE) 0.4 0.0 - 0.4 x10E3/uL   Basophils Absolute 0.1 0.0 - 0.2 x10E3/uL   Immature Granulocytes 0 Not Estab. %   Immature Grans (Abs) 0.0 0.0 - 0.1 x10E3/uL  Comprehensive metabolic panel  Result Value Ref Range   Glucose 94 70 - 99 mg/dL   BUN 16 6 - 24 mg/dL   Creatinine, Ser 6.21 0.76 - 1.27 mg/dL   eGFR 81 >30 QM/VHQ/4.69   BUN/Creatinine Ratio 15 9 - 20   Sodium 140 134 - 144 mmol/L  Potassium 4.9 3.5 - 5.2 mmol/L   Chloride 104 96 - 106 mmol/L   CO2 23 20 - 29 mmol/L   Calcium 9.8 8.7 - 10.2 mg/dL   Total Protein 6.7 6.0 - 8.5 g/dL   Albumin 4.5 3.8 - 4.9 g/dL   Globulin, Total 2.2 1.5 - 4.5 g/dL   Albumin/Globulin Ratio 2.0 1.2 - 2.2   Bilirubin Total 0.6 0.0 - 1.2 mg/dL   Alkaline Phosphatase 60 44 - 121 IU/L   AST 37 0 - 40 IU/L   ALT 37 0 - 44 IU/L  Urinalysis, Routine w reflex microscopic  Result Value Ref Range   Specific Gravity, UA 1.015 1.005 - 1.030   pH, UA 8.5 (H) 5.0 - 7.5   Color, UA Yellow Yellow   Appearance Ur Clear Clear   Leukocytes,UA Negative Negative   Protein,UA 1+ (A) Negative/Trace   Glucose, UA Negative Negative   Ketones, UA 1+ (A) Negative   RBC, UA Negative Negative   Bilirubin, UA Negative Negative   Urobilinogen, Ur 0.2 0.2 - 1.0 mg/dL   Nitrite, UA Negative Negative   Microscopic Examination See below:   Vitamin D (25 hydroxy)  Result Value Ref Range   Vit D, 25-Hydroxy 58.2 30.0 - 100.0 ng/mL      Assessment & Plan:   Problem List Items Addressed This Visit       Musculoskeletal and Integument   Cervical DDD (C4-5, C5-6, C6-7 and C7-T1) - Primary (Chronic)    Continue to follow with physical therapy and pain management. Light duty note provided. Discussed that if he needs ADA accomodation we do not do that in this office and he would need to get it from specialist. Follow up with PCP as scheduled. Call with any concerns.         Follow up plan: Return if symptoms worsen or fail to improve.

## 2023-04-25 NOTE — Progress Notes (Signed)
PROVIDER NOTE: Information contained herein reflects review and annotations entered in association with encounter. Interpretation of such information and data should be left to medically-trained personnel. Information provided to patient can be located elsewhere in the medical record under "Patient Instructions". Document created using STT-dictation technology, any transcriptional errors that may result from process are unintentional.    Patient: Jose Washington  Service Category: E/M  Provider: Oswaldo Done, MD  DOB: 1966/02/26  DOS: 05/02/2023  Referring Provider: Larae Grooms, NP  MRN: [id number]  Specialty: Interventional Pain Management  PCP: Larae Grooms, NP  Type: Established Patient  Setting: Ambulatory outpatient    Location: Office  Delivery: Face-to-face     HPI  [name], a 57 y.o. year old male, is here today because of his Chronic pain syndrome [G89.4]. Jose Washington primary complain today is Neck Pain and Back Pain (Lower right)  Pertinent problems: Jose Washington has Chronic neck pain (3ry area of Pain) (Bilateral); Chronic pain syndrome; Chronic low back pain (1ry area of Pain) (Bilateral) (L>R) w/o sciatica ; Chronic upper back pain (4th area of Pain) (Bilateral); Chronic shoulder pain (Bilateral) (L>R); Osteoarthritis of AC (acromioclavicular) joint (Right); Chronic sacroiliac joint pain (Bilateral) (L>R); Muscle spasticity; Cervical DDD (C4-5, C5-6, C6-7 and C7-T1); Cervical foraminal stenosis (Bilateral: C5-6 & C6-7, Left: C4-5 & C7-T1); Cervical radiculitis (Bilateral) (L>R); Cervical facet syndrome (Bilateral) (L>R); Cervical spondylosis; Musculoskeletal neck pain (trapezius); Chronic knee pain (Left); Chronic lower extremity pain (2ry area of Pain) (Left); Grade 1 Retrolisthesis of L3 over L4; Lumbar facet arthropathy (Bilateral); Lumbar facet osteoarthritis; Lumbar facet syndrome (Bilateral) (R>L); Lumbar spondylosis; Chronic shoulder pain (Right);  Suprascapular neuropathy (Right); DDD (degenerative disc disease), lumbosacral; Lumbar facet hypertrophy (Multilevel) (Bilateral); Spondylosis without myelopathy or radiculopathy, lumbosacral region; Inflammatory spondylopathy of lumbosacral region [facility]); Chronic lower extremity pain (Right); Chronic musculoskeletal pain; Abnormal MRI, lumbar spine (09/07/2017); Cervicalgia (Bilateral); Spondylosis without myelopathy or radiculopathy, cervical region; Myofascial pain syndrome of thoracic spine (serratus muscle) (Right); Chronic flank pain (Right); Abnormal result on screening urine test (03/27/20 & 08/20/20); Chronic hip pain (Bilateral); Chronic hip pain (Right); Enthesopathy of hip region (Right); Osteoarthritis of hip (Right); Trigger point of shoulder region (Right); and Pain of cervical facet joint on their pertinent problem list. Pain Assessment: Severity of   is reported as a 1 /10. Location:    / . Onset:  . Quality:  . Timing:  . Modifying factor(s):  Marland Kitchen Vitals:  height is 5\' 7"  (1.702 m) and weight is 185 lb (83.9 kg). His temporal temperature is 97.8 F (36.6 C). His blood pressure is 135/78 and his pulse is 73. His respiration is 16 and oxygen saturation is 100%.  BMI: Estimated body mass index is 28.98 kg/m as calculated from the following:   Height as of this encounter: 5\' 7"  (1.702 m).   Weight as of this encounter: 185 lb (83.9 kg). Last encounter: 02/02/2023. Last procedure: 04/12/2023.  Reason for encounter: both, medication management and post-procedure evaluation and assessment.  The patient indicates doing well with the current medication regimen. No adverse reactions or side effects reported to the medications.   Routine UDS ordered today.   RTCB: 08/06/2023   Post-procedure evaluation   Procedure: Cervical Facet, Medial Branch Radiofrequency Ablation (RFA)  #1  Laterality: Right (-RT)  Level: C3, C4, C5, C6, and C7 Medial Branch Level(s). Lesioning of these levels should  completely denervate the C3-4, C4-5, C5-6, and C6-7 cervical facet joints.  Imaging: Fluoroscopy-guided  Anesthesia: Local anesthesia (1-2% Lidocaine) Anxiolysis: IV Versed         Sedation: Moderate Sedation                       DOS: 04/12/2023  Performed by: Oswaldo Done, MD  Purpose: Therapeutic/Palliative Indications: Chronic neck pain (cervicalgia) severe enough to impact quality of life or function. Rationale (medical necessity): procedure needed and proper for the diagnosis and/or treatment of Jose Washington medical symptoms and needs. Indications: 1. Cervical facet syndrome (Bilateral) (L>R)   2. Pain of cervical facet joint   3. Cervicalgia (Bilateral)   4. Spondylosis without myelopathy or radiculopathy, cervical region   5. Cervical DDD (C4-5, C5-6, C6-7 and C7-T1)       Effectiveness:  Initial hour after procedure: 100 %. Subsequent 4-6 hours post-procedure: 90 %. Analgesia past initial 6 hours: 90 %. Ongoing improvement:  Analgesic: The patient indicates having an ongoing 90% improvement after his radiofrequency ablation on the right side of the cervical region. Function: Jose Washington reports improvement in function ROM: Jose Washington reports improvement in ROM  Pharmacotherapy Assessment  Analgesic: Hydrocodone/APAP 5/325, 1 tab PO QD (5 mg/day of hydrocodone) MME/day: 5 mg/day.   Monitoring: [place] PMP: PDMP reviewed during this encounter.       Pharmacotherapy: No side-effects or adverse reactions reported. Compliance: No problems identified. Effectiveness: Clinically acceptable.  Nonah Mattes, RN  05/02/2023 10:56 AM  Sign when Signing Visit Nursing Pain Medication Assessment:  Safety precautions to be maintained throughout the outpatient stay will include: orient to surroundings, keep bed in low position, maintain call bell within reach at all times, provide assistance with transfer out of bed and ambulation.  Medication Inspection Compliance: Pill  count conducted under aseptic conditions, in front of the patient. Neither the pills nor the bottle was removed from the patient's sight at any time. Once count was completed pills were immediately returned to the patient in their original bottle.  Medication: Hydrocodone/APAP Pill/Patch Count:  25 of 30 pills remain Pill/Patch Appearance: Markings consistent with prescribed medication Bottle Appearance: Standard pharmacy container. Clearly labeled. Filled Date: 88 / 27 / 2024 Last Medication intake:  Day before yesterday    No results found for: "CBDTHCR" No results found for: "D8THCCBX" No results found for: "D9THCCBX"  UDS:  Summary  Date Value Ref Range Status  04/19/2022 Note  Final    Comment:    ==================================================================== ToxASSURE Select 13 (MW) ==================================================================== Test                             Result       Flag       Units  Drug Present and Declared for Prescription Verification   Hydrocodone                    520          EXPECTED   ng/mg creat   Dihydrocodeine                 94           EXPECTED   ng/mg creat   Norhydrocodone                 307          EXPECTED   ng/mg creat    Sources of hydrocodone include scheduled prescription medications.    Dihydrocodeine and norhydrocodone are expected  metabolites of    hydrocodone. Dihydrocodeine is also available as a scheduled    prescription medication.  ==================================================================== Test                      Result    Flag   Units      Ref Range   Creatinine              301              mg/dL      >=16 ==================================================================== Declared Medications:  The flagging and interpretation on this report are based on the  following declared medications.  Unexpected results may arise from  inaccuracies in the declared medications.   **Note: The testing  scope of this panel includes these medications:   Hydrocodone (Norco)   **Note: The testing scope of this panel does not include the  following reported medications:   Acetaminophen (Tylenol)  Acetaminophen (Norco)  Atenolol (Tenormin)  Atorvastatin (Lipitor)  Baclofen (Lioresal)  Buspirone (Buspar)  Calcium  Cyanocobalamin  Desloratadine (Clarinex)  Diphenhydramine  Docusate (Colace)  Duloxetine (Cymbalta)  Fenofibrate  Fluticasone (Flonase)  Glucosamine  Hydroxyzine (Atarax)  Ibuprofen (Advil)  Levalbuterol (Xopenex)  Magnesium (Mag-Ox)  Melatonin  Methylsulfonylmethane  Multivitamin  Omeprazole (Prilosec)  Ondansetron (Zofran)  Potassium  Simethicone  Sucralfate (Carafate)  Supplement  Vitamin C  Vitamin D3  Vitamin E ==================================================================== For clinical consultation, please call [phone]. ====================================================================       ROS  Constitutional: Denies any fever or chills Gastrointestinal: No reported hemesis, hematochezia, vomiting, or acute GI distress Musculoskeletal: Denies any acute onset joint swelling, redness, loss of ROM, or weakness Neurological: No reported episodes of acute onset apraxia, aphasia, dysarthria, agnosia, amnesia, paralysis, loss of coordination, or loss of consciousness  Medication Review  Apoaequorin, Calcium Carbonate, DULoxetine, Elderberry, Glucosamine-Chondroit-Vit C-Mn, HYDROcodone-acetaminophen, Methylsulfonylmethane, Potassium, Vitamin D3, acetaminophen, atenolol, atorvastatin, baclofen, busPIRone, cyanocobalamin, desloratadine-pseudoephedrine, diphenhydrAMINE HCl, docusate sodium, fenofibrate, fluticasone, hydrOXYzine, ibuprofen, levalbuterol, magnesium oxide, melatonin, multivitamin, naloxone, omeprazole, ondansetron, simethicone, sucralfate, and vitamin E  History Review  Allergy: Jose Washington has No Known Allergies. Drug: Jose Washington   reports no history of drug use. Alcohol:  reports no history of alcohol use. Tobacco:  reports that he has never smoked. He has never used smokeless tobacco. Social: Jose Washington  reports that he has never smoked. He has never used smokeless tobacco. He reports that he does not drink alcohol and does not use drugs. Medical:  has a past medical history of Allergy, Anxiety, Chronic duodenal ulcer with hemorrhage (2012), Chronic neck pain, Depression, GERD (gastroesophageal reflux disease), Hyperlipidemia, Hypertension, and Microscopic hematuria. Surgical: Jose Washington  has a past surgical history that includes eye muscle repair (1972 and 1975); bLEEDING ULCER (2012); Flexible sigmoidoscopy; Colonoscopy with propofol (N/A, 03/24/2021); and Esophagogastroduodenoscopy (egd) with propofol (N/A, 03/24/2021). Family: family history includes Allergies in his brother; Cancer in his maternal grandfather and mother; Dementia in his father and paternal grandfather; Depression in his father; Diabetes in his mother; Mental illness in his father; Stroke in his paternal grandfather.  Laboratory Chemistry Profile   Renal Lab Results  Component Value Date   BUN 16 02/22/2023   CREATININE 1.07 02/22/2023   BCR 15 02/22/2023   GFRAA 84 12/24/2020   GFRNONAA >60 02/08/2021    Hepatic Lab Results  Component Value Date   AST 37 02/22/2023   ALT 37 02/22/2023   ALBUMIN 4.5 02/22/2023   ALKPHOS 60 02/22/2023   LIPASE 28  02/08/2021    Electrolytes Lab Results  Component Value Date   NA 140 02/22/2023   K 4.9 02/22/2023   CL 104 02/22/2023   CALCIUM 9.8 02/22/2023   MG 2.0 11/12/2016    Bone Lab Results  Component Value Date   VD25OH 58.2 02/22/2023   25OHVITD1 23 (L) 11/12/2016   25OHVITD2 <1.0 11/12/2016   25OHVITD3 23 11/12/2016    Inflammation (CRP: Acute Phase) (ESR: Chronic Phase) Lab Results  Component Value Date   CRP 1.4 (H) 11/12/2016   ESRSEDRATE 3 11/12/2016         Note: Above Lab  results reviewed.  Recent Imaging Review  DG Cervical Spine With Flex & Extend CLINICAL DATA:  Cervicalgia.  Neck pain.  EXAM: CERVICAL SPINE COMPLETE WITH FLEXION AND EXTENSION VIEWS  COMPARISON:  Cervical spine radiograph 11/12/2016  FINDINGS: There is 2-3 mm grade 1 anterolisthesis of C4 on C5, unchanged on flexion and extension. 1-2 mm anterolisthesis of C3 on C4 is unchanged on flexion, 0 mm on extension. 2-3 mm anterolisthesis of C5 on C6 is unchanged on flexion, 0 mm on extension. Moderate diffuse degenerative disc disease, most prominently affecting C6-C7 and C7-T1. C7-T1 degenerative changes have progressed from 2018. Advanced diffuse facet hypertrophy. Suggestion of diffuse bony neural foraminal narrowing, not well assessed due to positioning. The dens is not well assessed on provided views. No evidence of fracture or focal bone abnormality. No prevertebral soft tissue thickening.  IMPRESSION: 1. Moderate diffuse degenerative disc disease, most prominently affecting C6-C7 and C7-T1. 2. Advanced diffuse facet hypertrophy. 3. Suggestion of diffuse bony neural foraminal narrowing, not well assessed due to positioning. 4. Grade 1 anterolisthesis of C4 on C5 is unchanged on flexion and extension. Grade 1 anterolisthesis of C3 on C4 and C5 on C6 is unchanged on flexion, 0 mm on extension.  Electronically Signed   By: Narda Rutherford M.D.   On: 04/17/2023 14:28 Note: Reviewed        Physical Exam  General appearance: Well nourished, well developed, and well hydrated. In no apparent acute distress Mental status: Alert, oriented x 3 (person, place, & time)       Respiratory: No evidence of acute respiratory distress Eyes: PERLA Vitals: BP 135/78 (BP Location: Right Arm, Patient Position: Sitting, Cuff Size: Normal)   Pulse 73   Temp 97.8 F (36.6 C) (Temporal)   Resp 16   Ht 5\' 7"  (1.702 m)   Wt 185 lb (83.9 kg)   SpO2 100%   BMI 28.98 kg/m  BMI: Estimated body  mass index is 28.98 kg/m as calculated from the following:   Height as of this encounter: 5\' 7"  (1.702 m).   Weight as of this encounter: 185 lb (83.9 kg). Ideal: Ideal body weight: 66.1 kg (145 lb 11.6 oz) Adjusted ideal body weight: 73.2 kg (161 lb 6.9 oz)  Assessment   Diagnosis Status  1. Chronic pain syndrome   2. Chronic low back pain (1ry area of Pain) (Bilateral) (L>R) w/o sciatica    3. Chronic lower extremity pain (2ry area of Pain) (Left)   4. Chronic neck pain (3ry area of Pain) (Bilateral) (L>R)   5. Chronic upper back pain (4th area of Pain) (Bilateral) (L>R)   6. Lumbar facet syndrome (Bilateral) (R>L)   7. Chronic shoulder pain (Bilateral) (L>R)   8. Pharmacologic therapy   9. Chronic use of opiate for therapeutic purpose   10. Opiate use (5 MME/Day)   11. Encounter for medication management  12. Encounter for chronic pain management    Controlled Controlled Controlled   Updated Problems: No problems updated.  Plan of Care  Problem-specific:  No problem-specific Assessment & Plan notes found for this encounter.  Jose Washington has a current medication list which includes the following long-term medication(s): atenolol, atorvastatin, baclofen, calcium carbonate, diphenhydramine hcl, duloxetine, fenofibrate, [START ON 05/08/2023] hydrocodone-acetaminophen, [START ON 06/07/2023] hydrocodone-acetaminophen, [START ON 07/07/2023] hydrocodone-acetaminophen, levalbuterol, omeprazole, potassium, simethicone, and sucralfate.  Pharmacotherapy (Medications Ordered): Meds ordered this encounter  Medications   HYDROcodone-acetaminophen (NORCO/VICODIN) 5-325 MG tablet    Sig: Take 1 tablet by mouth daily as needed for severe pain. Must last 30 days.    Dispense:  30 tablet    Refill:  0    DO NOT: delete (not duplicate); no partial-fill (will deny script to complete), no refill request (F/U required). DISPENSE: 1 day early if closed on fill date. WARN: No CNS-depressants  within 8 hrs of med.   HYDROcodone-acetaminophen (NORCO/VICODIN) 5-325 MG tablet    Sig: Take 1 tablet by mouth daily as needed for severe pain. Must last 30 days.    Dispense:  30 tablet    Refill:  0    DO NOT: delete (not duplicate); no partial-fill (will deny script to complete), no refill request (F/U required). DISPENSE: 1 day early if closed on fill date. WARN: No CNS-depressants within 8 hrs of med.   HYDROcodone-acetaminophen (NORCO/VICODIN) 5-325 MG tablet    Sig: Take 1 tablet by mouth daily as needed for severe pain. Must last 30 days.    Dispense:  30 tablet    Refill:  0    DO NOT: delete (not duplicate); no partial-fill (will deny script to complete), no refill request (F/U required). DISPENSE: 1 day early if closed on fill date. WARN: No CNS-depressants within 8 hrs of med.   Orders:  Orders Placed This Encounter  Procedures   ToxASSURE Select 13 (MW), Urine    Volume: 30 ml(s). Minimum 3 ml of urine is needed. Document temperature of fresh sample. Indications: Long term (current) use of opiate analgesic (Z61.096)    Order Specific Question:   Release to patient    Answer:   Immediate   Nursing Instructions:    1). STAT: UDS required today. 2). Make sure to document all opioids and benzodiazepines taken, including time of last intake. 3). If order is entered on a procedure day, make sure sample is obtained before any medications are administered.   Follow-up plan:   Return in about 3 months (around 08/06/2023) for Eval-day (M,W), (F2F), (MM).      Interventional Therapies  Risk Factors  Considerations:   Abnormal UDS (03/27/2020) & (08/20/20) (+) undisclosed carboxy-THC    Planned  Pending:      Under consideration:   Therapeutic right IA hip joint injection #2  Therapeutic right lumbar facet RFA #2    Completed:   Diagnostic/therapeutic right IA hip inj. x1 (12/09/2020) (100/100/95/90-95)  Therapeutic right lumbar facet RFA x2 (11/26/2021) (100/100/75/75)   Palliative right lumbar facet MBB x2 (06/07/2019) (100/100/100 x20 days/>75 x3 months)  Diagnostic/therapeutic left lumbar facet MBB x1 (06/07/2019) (100/100/100 x20 days/>75 x3 months)  Diagnostic/therapeutic left L4-5 LESI x2 (07/25/2018) (n/a)  Palliative left CESI x3 (02/16/2018) (100/100/100/100)  Diagnostic right cervical facet MBB x2 (08/19/2022) (100/100/100/100) (w/ local anesthetic only)  Diagnostic left cervical facet MBB x1 (01/10/2020) (100/100/100)  Therapeutic right cervical facet RFA x1 (04/12/2023) (100/100/90/90)  Diagnostic/therapeutic right shoulder region TPI/MNB x2 (07/19/2022) (100/100/100/100)  Therapeutic  Palliative (PRN) options:   Therapeutic lumbar facet RFA   Therapeutic/palliative lumbar facet MBB   Therapeutic L4-5 LESI   Therapeutic CESI   Therapeutic right IA hip joint injection #2  Diagnostic cervical facet MBB #2    Pharmacotherapy  Nonopioids transferred 08/20/2020: Baclofen       Recent Visits Date Type Provider Dept  04/12/23 Procedure visit Delano Metz, MD [facility]  02/02/23 Office Visit Delano Metz, MD [facility]  Showing recent visits within past 90 days and meeting all other requirements Today's Visits Date Type Provider Dept  05/02/23 Office Visit Delano Metz, MD [facility]  Showing today's visits and meeting all other requirements Future Appointments Date Type Provider Dept  06/07/23 Appointment Delano Metz, MD [facility]  Showing future appointments within next 90 days and meeting all other requirements  I discussed the assessment and treatment plan with the patient. The patient was provided an opportunity to ask questions and all were answered. The patient agreed with the plan and demonstrated an understanding of the instructions.  Patient advised to call back or seek an in-person evaluation if the symptoms or condition worsens.  Duration of encounter: 30  minutes.  Total time on encounter, as per AMA guidelines included both the face-to-face and non-face-to-face time personally spent by the physician and/or other qualified health care professional(s) on the day of the encounter (includes time in activities that require the physician or other qualified health care professional and does not include time in activities normally performed by clinical staff). Physician's time may include the following activities when performed: Preparing to see the patient (e.g., pre-charting review of records, searching for previously ordered imaging, lab work, and nerve conduction tests) Review of prior analgesic pharmacotherapies. Reviewing PMP Interpreting ordered tests (e.g., lab work, imaging, nerve conduction tests) Performing post-procedure evaluations, including interpretation of diagnostic procedures Obtaining and/or reviewing separately obtained history Performing a medically appropriate examination and/or evaluation Counseling and educating the patient/family/caregiver Ordering medications, tests, or procedures Referring and communicating with other health care professionals (when not separately reported) Documenting clinical information in the electronic or other health record Independently interpreting results (not separately reported) and communicating results to the patient/ family/caregiver Care coordination (not separately reported)  Note by: Oswaldo Done, MD Date: 05/02/2023; Time: 11:09 AM

## 2023-04-26 ENCOUNTER — Other Ambulatory Visit: Payer: Self-pay | Admitting: Nurse Practitioner

## 2023-04-27 NOTE — Telephone Encounter (Signed)
Requested medication (s) are due for refill today: expired medication  Requested medication (s) are on the active medication list: yes  Last refill:  03/08/22 #15 each 12 refills  Future visit scheduled: yes in 4 months   Notes to clinic:  expired medication . Do you want to renew Rx?     Requested Prescriptions  Pending Prescriptions Disp Refills   levalbuterol (XOPENEX HFA) 45 MCG/ACT inhaler [Pharmacy Med Name: LEVALBUTEROL TAR HFA INH] 15 each 12    Sig: INHALE 1 PUFF INTO THE LUNGS EVERY 6 HOURS AS NEEDED FOR WHEEZING.     Pulmonology:  Beta Agonists 2 Passed - 04/26/2023  9:38 AM      Passed - Last BP in normal range    BP Readings from Last 1 Encounters:  04/20/23 129/83         Passed - Last Heart Rate in normal range    Pulse Readings from Last 1 Encounters:  04/20/23 69         Passed - Valid encounter within last 12 months    Recent Outpatient Visits           1 week ago Cervical DDD (C4-5, C5-6, C6-7 and C7-T1)   [facility] [place], Megan P, DO   2 months ago Annual physical exam   [facility] Larae Grooms, NP   10 months ago Aortic atherosclerosis [facility])   [place] Larae Grooms, NP   1 year ago Annual physical exam   [facility] Larae Grooms, NP   1 year ago Upper respiratory tract infection, unspecified type   [facility] Gerre Scull, NP       Future Appointments             In 4 months Larae Grooms, NP [facility]

## 2023-04-28 NOTE — Patient Instructions (Signed)
____________________________________________________________________________________________  Opioid Pain Medication Update  To: All patients taking opioid pain medications. (I.e.: hydrocodone, hydromorphone, oxycodone, oxymorphone, morphine, codeine, methadone, tapentadol, tramadol, buprenorphine, fentanyl, etc.)  Re: Updated review of side effects and adverse reactions of opioid analgesics, as well as new information about long term effects of this class of medications.  Direct risks of long-term opioid therapy are not limited to opioid addiction and overdose. Potential medical risks include serious fractures, breathing problems during sleep, hyperalgesia, immunosuppression, chronic constipation, bowel obstruction, myocardial infarction, and tooth decay secondary to xerostomia.  Unpredictable adverse effects that can occur even if you take your medication correctly: Cognitive impairment, respiratory depression, and death. Most people think that if they take their medication "correctly", and "as instructed", that they will be safe. Nothing could be farther from the truth. In reality, a significant amount of recorded deaths associated with the use of opioids has occurred in individuals that had taken the medication for a long time, and were taking their medication correctly. The following are examples of how this can happen: Patient taking his/her medication for a long time, as instructed, without any side effects, is given a certain antibiotic or another unrelated medication, which in turn triggers a "Drug-to-drug interaction" leading to disorientation, cognitive impairment, impaired reflexes, respiratory depression or an untoward event leading to serious bodily harm or injury, including death.  Patient taking his/her medication for a long time, as instructed, without any side effects, develops an acute impairment of liver and/or kidney function. This will lead to a rapid inability of the body to  breakdown and eliminate their pain medication, which will result in effects similar to an "overdose", but with the same medicine and dose that they had always taken. This again may lead to disorientation, cognitive impairment, impaired reflexes, respiratory depression or an untoward event leading to serious bodily harm or injury, including death.  A similar problem will occur with patients as they grow older and their liver and kidney function begins to decrease as part of the aging process.  Background information: Historically, the original case for using long-term opioid therapy to treat chronic noncancer pain was based on safety assumptions that subsequent experience has called into question. In 1996, the American Pain Society and the American Academy of Pain Medicine issued a consensus statement supporting long-term opioid therapy. This statement acknowledged the dangers of opioid prescribing but concluded that the risk for addiction was low; respiratory depression induced by opioids was short-lived, occurred mainly in opioid-naive patients, and was antagonized by pain; tolerance was not a common problem; and efforts to control diversion should not constrain opioid prescribing. This has now proven to be wrong. Experience regarding the risks for opioid addiction, misuse, and overdose in community practice has failed to support these assumptions.  According to the Centers for Disease Control and Prevention, fatal overdoses involving opioid analgesics have increased sharply over the past decade. Currently, more than 96,700 people die from drug overdoses every year. Opioids are a factor in 7 out of every 10 overdose deaths. Deaths from drug overdose have surpassed motor vehicle accidents as the leading cause of death for individuals between the ages of 35 and 54.  Clinical data suggest that neuroendocrine dysfunction may be very common in both men and women, potentially causing hypogonadism, erectile  dysfunction, infertility, decreased libido, osteoporosis, and depression. Recent studies linked higher opioid dose to increased opioid-related mortality. Controlled observational studies reported that long-term opioid therapy may be associated with increased risk for cardiovascular events. Subsequent meta-analysis concluded   that the safety of long-term opioid therapy in elderly patients has not been proven.   Side Effects and adverse reactions: Common side effects: Drowsiness (sedation). Dizziness. Nausea and vomiting. Constipation. Physical dependence -- Dependence often manifests with withdrawal symptoms when opioids are discontinued or decreased. Tolerance -- As you take repeated doses of opioids, you require increased medication to experience the same effect of pain relief. Respiratory depression -- This can occur in healthy people, especially with higher doses. However, people with COPD, asthma or other lung conditions may be even more susceptible to fatal respiratory impairment.  Uncommon side effects: An increased sensitivity to feeling pain and extreme response to pain (hyperalgesia). Chronic use of opioids can lead to this. Delayed gastric emptying (the process by which the contents of your stomach are moved into your small intestine). Muscle rigidity. Immune system and hormonal dysfunction. Quick, involuntary muscle jerks (myoclonus). Arrhythmia. Itchy skin (pruritus). Dry mouth (xerostomia).  Long-term side effects: Chronic constipation. Sleep-disordered breathing (SDB). Increased risk of bone fractures. Hypothalamic-pituitary-adrenal dysregulation. Increased risk of overdose.  RISKS: Fractures and Falls:  Opioids increase the risk and incidence of falls. This is of particular importance in elderly patients.  Endocrine System:  Long-term administration is associated with endocrine abnormalities (endocrinopathies). (Also known as Opioid-induced Endocrinopathy) Influences  on both the hypothalamic-pituitary-adrenal axis?and the hypothalamic-pituitary-gonadal axis have been demonstrated with consequent hypogonadism and adrenal insufficiency in both sexes. Hypogonadism and decreased levels of dehydroepiandrosterone sulfate have been reported in men and women. Endocrine effects include: Amenorrhoea in women (abnormal absence of menstruation) Reduced libido in both sexes Decreased sexual function Erectile dysfunction in men Hypogonadisms (decreased testicular function with shrinkage of testicles) Infertility Depression and fatigue Loss of muscle mass Anxiety Depression Immune suppression Hyperalgesia Weight gain Anemia Osteoporosis Patients (particularly women of childbearing age) should avoid opioids. There is insufficient evidence to recommend routine monitoring of asymptomatic patients taking opioids in the long-term for hormonal deficiencies.  Immune System: Human studies have demonstrated that opioids have an immunomodulating effect. These effects are mediated via opioid receptors both on immune effector cells and in the central nervous system. Opioids have been demonstrated to have adverse effects on antimicrobial response and anti-tumour surveillance. Buprenorphine has been demonstrated to have no impact on immune function.  Opioid Induced Hyperalgesia: Human studies have demonstrated that prolonged use of opioids can lead to a state of abnormal pain sensitivity, sometimes called opioid induced hyperalgesia (OIH). Opioid induced hyperalgesia is not usually seen in the absence of tolerance to opioid analgesia. Clinically, hyperalgesia may be diagnosed if the patient on long-term opioid therapy presents with increased pain. This might be qualitatively and anatomically distinct from pain related to disease progression or to breakthrough pain resulting from development of opioid tolerance. Pain associated with hyperalgesia tends to be more diffuse than the  pre-existing pain and less defined in quality. Management of opioid induced hyperalgesia requires opioid dose reduction.  Cancer: Chronic opioid therapy has been associated with an increased risk of cancer among noncancer patients with chronic pain. This association was more evident in chronic strong opioid users. Chronic opioid consumption causes significant pathological changes in the small intestine and colon. Epidemiological studies have found that there is a link between opium dependence and initiation of gastrointestinal cancers. Cancer is the second leading cause of death after cardiovascular disease. Chronic use of opioids can cause multiple conditions such as GERD, immunosuppression and renal damage as well as carcinogenic effects, which are associated with the incidence of cancers.   Mortality: Long-term opioid use   has been associated with increased mortality among patients with chronic non-cancer pain (CNCP).  Prescription of long-acting opioids for chronic noncancer pain was associated with a significantly increased risk of all-cause mortality, including deaths from causes other than overdose.  Reference: Von Korff M, Kolodny A, Deyo RA, Chou R. Long-term opioid therapy reconsidered. Ann Intern Med. 2011 Sep 6;155(5):325-8. doi: 10.7326/[id number]. PMID: 21893626; PMCID: PMC3280085. Bedson J, Chen Y, Ashworth J, Hayward RA, Dunn KM, Jordan KP. Risk of adverse events in patients prescribed long-term opioids: A cohort study in the [place] Clinical Practice Research Datalink. Eur J Pain. 2019 May;23(5):908-922. doi: 10.1002/ejp.1357. Epub 2019 Jan 31. PMID: 30620116. Colameco S, Coren JS, Ciervo CA. Continuous opioid treatment for chronic noncancer pain: a time for moderation in prescribing. Postgrad Med. 2009 Jul;121(4):61-6. doi: 10.3810/pgm.2009.07.2032. PMID: 19641271. Chou R, Turner JA, Devine EB, Hansen RN, Sullivan SD, Blazina I, Dana T, Bougatsos C, Deyo RA. The  effectiveness and risks of long-term opioid therapy for chronic pain: a systematic review for a National Institutes of Health Pathways to Prevention Workshop. Ann Intern Med. 2015 Feb 17;162(4):276-86. doi: 10.7326/M14-2559. PMID: 25581257. Warner M, Chen LH, Makuc DM. NCHS Data Brief No. 22. [place]: Centers for Disease Control and Prevention; 2009. Sep, Increase in Fatal Poisonings Involving Opioid Analgesics in the [place], 1999-2006. Song IA, Choi HR, Oh TK. Long-term opioid use and mortality in patients with chronic non-cancer pain: Ten-year follow-up study in [place] from 2010 through 2019. EClinicalMedicine. 2022 Jul 18;51:101558. doi: 10.1016/j.eclinm.2022.101558. PMID: 35875817; PMCID: PMC9304910. Huser, W., Schubert, T., Vogelmann, T. et al. All-cause mortality in patients with long-term opioid therapy compared with non-opioid analgesics for chronic non-cancer pain: a database study. BMC Med 18, 162 (2020). https://doi.org/10.1186/[email or web address] Rashidian H, Zendehdel K, Kamangar F, Malekzadeh R, Haghdoost AA. An Ecological Study of the Association between Opiate Use and Incidence of Cancers. Addict Health. 2016 Fall;8(4):252-260. PMID: 28819556; PMCID: PMC5554805.  Our Goal: Our goal is to control your pain with means other than the use of opioid pain medications.  Our Recommendation: Talk to your physician about coming off of these medications. We can assist you with the tapering down and stopping these medicines. Based on the new information, even if you cannot completely stop the medication, a decrease in the dose may be associated with a lesser risk. Ask for other means of controlling the pain. Decrease or eliminate those factors that significantly contribute to your pain such as smoking, obesity, and a diet heavily tilted towards "inflammatory" nutrients.  Last Updated: 12/29/2022    ____________________________________________________________________________________________     ____________________________________________________________________________________________  Transfer of Pain Medication between Pharmacies  Re: 2023 DEA Clarification on existing regulation  Published on DEA Website: July 02, 2022  Title: Revised Regulation Allows DEA-Registered Pharmacies to Transfer Electronic Prescriptions at a Patient's Request DEA Headquarters Division - Public Information Office  "Patients now have the ability to request their electronic prescription be transferred to another pharmacy without having to go back to their practitioner to initiate the request. This revised regulation went into effect on Monday, June 28, 2022.     At a patient's request, a DEA-registered retail pharmacy can now transfer an electronic prescription for a controlled substance (schedules II-V) to another DEA-registered retail pharmacy. Prior to this change, patients would have to go through their practitioner to cancel their prescription and have it re-issued to a different pharmacy. The process was taxing and time consuming for both patients and practitioners.    The Drug Enforcement Administration (DEA) published its intent to revise the   process for transferring electronic prescriptions on September 19, 2020.  The final rule was published in the federal register on May 27, 2022 and went into effect 30 days later.  Under the final rule, a prescription can only be transferred once between pharmacies, and only if allowed under existing state or other applicable law. The prescription must remain in its electronic form; may not be altered in any way; and the transfer must be communicated directly between two licensed pharmacists. It's important to note, any authorized refills transfer with the original prescription, which means the entire prescription will be filled at the same pharmacy."     REFERENCES: 1. DEA website announcement https://www.dea.gov/stories/2023/2023-07/2022-09-01/revised-regulation-allows-dea-registered-pharmacies-transfer  2. Department of Justice website  https://www.govinfo.gov/content/pkg/FR-2022-05-27/pdf/2023-15847.pdf  3. DEPARTMENT OF JUSTICE Drug Enforcement Administration 21 CFR Part 1306 [Docket No. DEA-637] RIN [id number] "Transfer of Electronic Prescriptions for Schedules II-V Controlled Substances Between Pharmacies for Initial Filling"  ____________________________________________________________________________________________     _______________________________________________________________________  Medication Rules  Purpose: To inform patients, and their family members, of our medication rules and regulations.  Applies to: All patients receiving prescriptions from our practice (written or electronic).  Pharmacy of record: This is the pharmacy where your electronic prescriptions will be sent. Make sure we have the correct one.  Electronic prescriptions: In compliance with the [place] Strengthen Opioid Misuse Prevention (STOP) Act of 2017 (Session Law 2017-74/H243), effective November 01, 2018, all controlled substances must be electronically prescribed. Written prescriptions, faxing, or calling prescriptions to a pharmacy will no longer be done.  Prescription refills: These will be provided only during in-person appointments. No medications will be renewed without a "face-to-face" evaluation with your provider. Applies to all prescriptions.  NOTE: The following applies primarily to controlled substances (Opioid* Pain Medications).   Type of encounter (visit): For patients receiving controlled substances, face-to-face visits are required. (Not an option and not up to the patient.)  Patient's responsibilities: Pain Pills: Bring all pain pills to every appointment (except for procedure appointments). Pill Bottles: Bring  pills in original pharmacy bottle. Bring bottle, even if empty. Always bring the bottle of the most recent fill.  Medication refills: You are responsible for knowing and keeping track of what medications you are taking and when is it that you will need a refill. The day before your appointment: write a list of all prescriptions that need to be refilled. The day of the appointment: give the list to the admitting nurse. Prescriptions will be written only during appointments. No prescriptions will be written on procedure days. If you forget a medication: it will not be "Called in", "Faxed", or "electronically sent". You will need to get another appointment to get these prescribed. No early refills. Do not call asking to have your prescription filled early. Partial  or short prescriptions: Occasionally your pharmacy may not have enough pills to fill your prescription.  NEVER ACCEPT a partial fill or a prescription that is short of the total amount of pills that you were prescribed.  With controlled substances the law allows 72 hours for the pharmacy to complete the prescription.  If the prescription is not completed within 72 hours, the pharmacist will require a new prescription to be written. This means that you will be short on your medicine and we WILL NOT send another prescription to complete your original prescription.  Instead, request the pharmacy to send a carrier to a nearby branch to get enough medication to provide you with your full prescription. Prescription Accuracy: You are responsible for carefully inspecting your   prescriptions before leaving our office. Have the discharge nurse carefully go over each prescription with you, before taking them home. Make sure that your name is accurately spelled, that your address is correct. Check the name and dose of your medication to make sure it is accurate. Check the number of pills, and the written instructions to make sure they are clear and accurate. Make  sure that you are given enough medication to last until your next medication refill appointment. Taking Medication: Take medication as prescribed. When it comes to controlled substances, taking less pills or less frequently than prescribed is permitted and encouraged. Never take more pills than instructed. Never take the medication more frequently than prescribed.  Inform other Doctors: Always inform, all of your healthcare providers, of all the medications you take. Pain Medication from other Providers: You are not allowed to accept any additional pain medication from any other Doctor or Healthcare provider. There are two exceptions to this rule. (see below) In the event that you require additional pain medication, you are responsible for notifying us, as stated below. Cough Medicine: Often these contain an opioid, such as codeine or hydrocodone. Never accept or take cough medicine containing these opioids if you are already taking an opioid* medication. The combination may cause respiratory failure and death. Medication Agreement: You are responsible for carefully reading and following our Medication Agreement. This must be signed before receiving any prescriptions from our practice. Safely store a copy of your signed Agreement. Violations to the Agreement will result in no further prescriptions. (Additional copies of our Medication Agreement are available upon request.) Laws, Rules, & Regulations: All patients are expected to follow all Federal and State Laws, Statutes, Rules, & Regulations. Ignorance of the Laws does not constitute a valid excuse.  Illegal drugs and Controlled Substances: The use of illegal substances (including, but not limited to marijuana and its derivatives) and/or the illegal use of any controlled substances is strictly prohibited. Violation of this rule may result in the immediate and permanent discontinuation of any and all prescriptions being written by our practice. The use of  any illegal substances is prohibited. Adopted CDC guidelines & recommendations: Target dosing levels will be at or below 60 MME/day. Use of benzodiazepines** is not recommended.  Exceptions: There are only two exceptions to the rule of not receiving pain medications from other Healthcare Providers. Exception #1 (Emergencies): In the event of an emergency (i.e.: accident requiring emergency care), you are allowed to receive additional pain medication. However, you are responsible for: As soon as you are able, call our office [phone], at any time of the day or night, and leave a message stating your name, the date and nature of the emergency, and the name and dose of the medication prescribed. In the event that your call is answered by a member of our staff, make sure to document and save the date, time, and the name of the person that took your information.  Exception #2 (Planned Surgery): In the event that you are scheduled by another doctor or dentist to have any type of surgery or procedure, you are allowed (for a period no longer than 30 days), to receive additional pain medication, for the acute post-op pain. However, in this case, you are responsible for picking up a copy of our "Post-op Pain Management for Surgeons" handout, and giving it to your surgeon or dentist. This document is available at our office, and does not require an appointment to obtain it. Simply go to   our office during business hours (Monday-Thursday from 8:00 AM to 4:00 PM) (Friday 8:00 AM to 12:00 Noon) or if you have a scheduled appointment with us, prior to your surgery, and ask for it by name. In addition, you are responsible for: calling our office [phone], at any time of the day or night, and leaving a message stating your name, name of your surgeon, type of surgery, and date of procedure or surgery. Failure to comply with your responsibilities may result in termination of therapy involving the controlled  substances. Medication Agreement Violation. Following the above rules, including your responsibilities will help you in avoiding a Medication Agreement Violation ("Breaking your Pain Medication Contract").  Consequences:  Not following the above rules may result in permanent discontinuation of medication prescription therapy.  *Opioid medications include: morphine, codeine, oxycodone, oxymorphone, hydrocodone, hydromorphone, meperidine, tramadol, tapentadol, buprenorphine, fentanyl, methadone. **Benzodiazepine medications include: diazepam (Valium), alprazolam (Xanax), clonazepam (Klonopine), lorazepam (Ativan), clorazepate (Tranxene), chlordiazepoxide (Librium), estazolam (Prosom), oxazepam (Serax), temazepam (Restoril), triazolam (Halcion) (Last updated: 08/24/2022) ______________________________________________________________________    ______________________________________________________________________  Medication Recommendations and Reminders  Applies to: All patients receiving prescriptions (written and/or electronic).  Medication Rules & Regulations: You are responsible for reading, knowing, and following our "Medication Rules" document. These exist for your safety and that of others. They are not flexible and neither are we. Dismissing or ignoring them is an act of "non-compliance" that may result in complete and irreversible termination of such medication therapy. For safety reasons, "non-compliance" will not be tolerated. As with the [place] fundamental legal principle of "ignorance of the law is no defense", we will accept no excuses for not having read and knowing the content of documents provided to you by our practice.  Pharmacy of record:  Definition: This is the pharmacy where your electronic prescriptions will be sent.  We do not endorse any particular pharmacy. It is up to you and your insurance to decide what pharmacy to use.  We do not restrict you in your choice of  pharmacy. However, once we write for your prescriptions, we will NOT be re-sending more prescriptions to fix restricted supply problems created by your pharmacy, or your insurance.  The pharmacy listed in the electronic medical record should be the one where you want electronic prescriptions to be sent. If you choose to change pharmacy, simply notify our nursing staff. Changes will be made only during your regular appointments and not over the phone.  Recommendations: Keep all of your pain medications in a safe place, under lock and key, even if you live alone. We will NOT replace lost, stolen, or damaged medication. We do not accept "Police Reports" as proof of medications having been stolen. After you fill your prescription, take 1 week's worth of pills and put them away in a safe place. You should keep a separate, properly labeled bottle for this purpose. The remainder should be kept in the original bottle. Use this as your primary supply, until it runs out. Once it's gone, then you know that you have 1 week's worth of medicine, and it is time to come in for a prescription refill. If you do this correctly, it is unlikely that you will ever run out of medicine. To make sure that the above recommendation works, it is very important that you make sure your medication refill appointments are scheduled at least 1 week before you run out of medicine. To do this in an effective manner, make sure that you do not leave the office without   scheduling your next medication management appointment. Always ask the nursing staff to show you in your prescription , when your medication will be running out. Then arrange for the receptionist to get you a return appointment, at least 7 days before you run out of medicine. Do not wait until you have 1 or 2 pills left, to come in. This is very poor planning and does not take into consideration that we may need to cancel appointments due to bad weather, sickness, or emergencies  affecting our staff. DO NOT ACCEPT A "Partial Fill": If for any reason your pharmacy does not have enough pills/tablets to completely fill or refill your prescription, do not allow for a "partial fill". The law allows the pharmacy to complete that prescription within 72 hours, without requiring a new prescription. If they do not fill the rest of your prescription within those 72 hours, you will need a separate prescription to fill the remaining amount, which we will NOT provide. If the reason for the partial fill is your insurance, you will need to talk to the pharmacist about payment alternatives for the remaining tablets, but again, DO NOT ACCEPT A PARTIAL FILL, unless you can trust your pharmacist to obtain the remainder of the pills within 72 hours.  Prescription refills and/or changes in medication(s):  Prescription refills, and/or changes in dose or medication, will be conducted only during scheduled medication management appointments. (Applies to both, written and electronic prescriptions.) No refills on procedure days. No medication will be changed or started on procedure days. No changes, adjustments, and/or refills will be conducted on a procedure day. Doing so will interfere with the diagnostic portion of the procedure. No phone refills. No medications will be "called into the pharmacy". No Fax refills. No weekend refills. No Holliday refills. No after hours refills.  Remember:  Business hours are:  Monday to Thursday 8:00 AM to 4:00 PM Provider's Schedule: Arbor Cohen, MD - Appointments are:  Medication management: Monday and Wednesday 8:00 AM to 4:00 PM Procedure day: Tuesday and Thursday 7:30 AM to 4:00 PM Bilal Lateef, MD - Appointments are:  Medication management: Tuesday and Thursday 8:00 AM to 4:00 PM Procedure day: Monday and Wednesday 7:30 AM to 4:00 PM (Last update: 08/24/2022) ______________________________________________________________________    ____________________________________________________________________________________________  Naloxone Nasal Spray  Why am I receiving this medication? [place] STOP ACT requires that all patients taking high dose opioids or at risk of opioids respiratory depression, be prescribed an opioid reversal agent, such as Naloxone (AKA: Narcan).  What is this medication? NALOXONE (nal OX one) treats opioid overdose, which causes slow or shallow breathing, severe drowsiness, or trouble staying awake. Call emergency services after using this medication. You may need additional treatment. Naloxone works by reversing the effects of opioids. It belongs to a group of medications called opioid blockers.  COMMON BRAND NAME(S): Kloxxado, Narcan  What should I tell my care team before I take this medication? They need to know if you have any of these conditions: Heart disease Substance use disorder An unusual or allergic reaction to naloxone, other medications, foods, dyes, or preservatives Pregnant or trying to get pregnant Breast-feeding  When to use this medication? This medication is to be used for the treatment of respiratory depression (less than 8 breaths per minute) secondary to opioid overdose.   How to use this medication? This medication is for use in the nose. Lay the person on their back. Support their neck with your hand and allow the head to tilt   back before giving the medication. The nasal spray should be given into 1 nostril. After giving the medication, move the person onto their side. Do not remove or test the nasal spray until ready to use. Get emergency medical help right away after giving the first dose of this medication, even if the person wakes up. You should be familiar with how to recognize the signs and symptoms of a narcotic overdose. If more doses are needed, give the additional dose in the other nostril. Talk to your care team about the use of this medication in children.  While this medication may be prescribed for children as young as newborns for selected conditions, precautions do apply.  Naloxone Overdosage: If you think you have taken too much of this medicine contact a poison control center or emergency room at once.  NOTE: This medicine is only for you. Do not share this medicine with others.  What if I miss a dose? This does not apply.  What may interact with this medication? This is only used during an emergency. No interactions are expected during emergency use. This list may not describe all possible interactions. Give your health care provider a list of all the medicines, herbs, non-prescription drugs, or dietary supplements you use. Also tell them if you smoke, drink alcohol, or use illegal drugs. Some items may interact with your medicine.  What should I watch for while using this medication? Keep this medication ready for use in the case of an opioid overdose. Make sure that you have the phone number of your care team and local hospital ready. You may need to have additional doses of this medication. Each nasal spray contains a single dose. Some emergencies may require additional doses. After use, bring the treated person to the nearest hospital or call 911. Make sure the treating care team knows that the person has received a dose of this medication. You will receive additional instructions on what to do during and after use of this medication before an emergency occurs.  What side effects may I notice from receiving this medication? Side effects that you should report to your care team as soon as possible: Allergic reactions--skin rash, itching, hives, swelling of the face, lips, tongue, or throat Side effects that usually do not require medical attention (report these to your care team if they continue or are bothersome): Constipation Dryness or irritation inside the nose Headache Increase in blood pressure Muscle spasms Stuffy  nose Toothache This list may not describe all possible side effects. Call your doctor for medical advice about side effects. You may report side effects to FDA at 1-800-FDA-1088.  Where should I keep my medication? Because this is an emergency medication, you should keep it with you at all times.  Keep out of the reach of children and pets. Store between 20 and 25 degrees C (68 and 77 degrees F). Do not freeze. Throw away any unused medication after the expiration date. Keep in original box until ready to use.  NOTE: This sheet is a summary. It may not cover all possible information. If you have questions about this medicine, talk to your doctor, pharmacist, or health care provider.   2023 [employer] Standard (2021-06-26 00:00:00)  ____________________________________________________________________________________________   

## 2023-05-02 ENCOUNTER — Ambulatory Visit: Payer: No Typology Code available for payment source | Attending: Pain Medicine | Admitting: Pain Medicine

## 2023-05-02 ENCOUNTER — Encounter: Payer: Self-pay | Admitting: Pain Medicine

## 2023-05-02 VITALS — BP 135/78 | HR 73 | Temp 97.8°F | Resp 16 | Ht 67.0 in | Wt 185.0 lb

## 2023-05-02 DIAGNOSIS — Z79899 Other long term (current) drug therapy: Secondary | ICD-10-CM | POA: Insufficient documentation

## 2023-05-02 DIAGNOSIS — M25511 Pain in right shoulder: Secondary | ICD-10-CM | POA: Diagnosis present

## 2023-05-02 DIAGNOSIS — Z79891 Long term (current) use of opiate analgesic: Secondary | ICD-10-CM | POA: Diagnosis present

## 2023-05-02 DIAGNOSIS — M25512 Pain in left shoulder: Secondary | ICD-10-CM | POA: Diagnosis present

## 2023-05-02 DIAGNOSIS — M545 Low back pain, unspecified: Secondary | ICD-10-CM | POA: Insufficient documentation

## 2023-05-02 DIAGNOSIS — F1190 Opioid use, unspecified, uncomplicated: Secondary | ICD-10-CM | POA: Insufficient documentation

## 2023-05-02 DIAGNOSIS — M542 Cervicalgia: Secondary | ICD-10-CM | POA: Insufficient documentation

## 2023-05-02 DIAGNOSIS — G894 Chronic pain syndrome: Secondary | ICD-10-CM | POA: Diagnosis present

## 2023-05-02 DIAGNOSIS — G8929 Other chronic pain: Secondary | ICD-10-CM | POA: Diagnosis present

## 2023-05-02 DIAGNOSIS — M47816 Spondylosis without myelopathy or radiculopathy, lumbar region: Secondary | ICD-10-CM | POA: Diagnosis present

## 2023-05-02 DIAGNOSIS — M79605 Pain in left leg: Secondary | ICD-10-CM | POA: Diagnosis present

## 2023-05-02 DIAGNOSIS — M549 Dorsalgia, unspecified: Secondary | ICD-10-CM | POA: Insufficient documentation

## 2023-05-02 MED ORDER — HYDROCODONE-ACETAMINOPHEN 5-325 MG PO TABS: 1.0000 | ORAL_TABLET | Freq: Every day | ORAL | 0 refills | Status: DC | PRN

## 2023-05-02 NOTE — Progress Notes (Signed)
Nursing Pain Medication Assessment:  Safety precautions to be maintained throughout the outpatient stay will include: orient to surroundings, keep bed in low position, maintain call bell within reach at all times, provide assistance with transfer out of bed and ambulation.  Medication Inspection Compliance: Pill count conducted under aseptic conditions, in front of the patient. Neither the pills nor the bottle was removed from the patient's sight at any time. Once count was completed pills were immediately returned to the patient in their original bottle.  Medication: Hydrocodone/APAP Pill/Patch Count:  25 of 30 pills remain Pill/Patch Appearance: Markings consistent with prescribed medication Bottle Appearance: Standard pharmacy container. Clearly labeled. Filled Date: 6 / 27 / 2024 Last Medication intake:  Day before yesterday

## 2023-05-05 LAB — TOXASSURE SELECT 13 (MW), URINE

## 2023-05-24 ENCOUNTER — Ambulatory Visit: Payer: No Typology Code available for payment source | Admitting: Pain Medicine

## 2023-06-06 ENCOUNTER — Other Ambulatory Visit: Payer: Self-pay | Admitting: Nurse Practitioner

## 2023-06-06 NOTE — Progress Notes (Unsigned)
PROVIDER NOTE: Information contained herein reflects review and annotations entered in association with encounter. Interpretation of such information and data should be left to medically-trained personnel. Information provided to patient can be located elsewhere in the medical record under "Patient Instructions". Document created using STT-dictation technology, any transcriptional errors that may result from process are unintentional.    Patient: Jose Washington  Service Category: E/M  Provider: Oswaldo Done, MD  DOB: 06-04-1966  DOS: 06/07/2023  Referring Provider: Larae Grooms, NP  MRN: [id number]  Specialty: Interventional Pain Management  PCP: Larae Grooms, NP  Type: Established Patient  Setting: Ambulatory outpatient    Location: Office  Delivery: Face-to-face     HPI  [name], a 57 y.o. year old male, is here today because of his Chronic neck pain [M54.2, G89.29]. [name] primary complain today is No chief complaint on file.  Pertinent problems: Jose Washington has Chronic neck pain (3ry area of Pain) (Bilateral); Chronic pain syndrome; Chronic low back pain (1ry area of Pain) (Bilateral) (L>R) w/o sciatica ; Chronic upper back pain (4th area of Pain) (Bilateral); Chronic shoulder pain (Bilateral) (L>R); Osteoarthritis of AC (acromioclavicular) joint (Right); Chronic sacroiliac joint pain (Bilateral) (L>R); Muscle spasticity; Cervical DDD (C4-5, C5-6, C6-7 and C7-T1); Cervical foraminal stenosis (Bilateral: C5-6 & C6-7, Left: C4-5 & C7-T1); Cervical radiculitis (Bilateral) (L>R); Cervical facet syndrome (Bilateral) (L>R); Cervical spondylosis; Musculoskeletal neck pain (trapezius); Chronic knee pain (Left); Chronic lower extremity pain (2ry area of Pain) (Left); Grade 1 Retrolisthesis of L3 over L4; Lumbar facet arthropathy (Bilateral); Lumbar facet osteoarthritis; Lumbar facet syndrome (Bilateral) (R>L); Lumbar spondylosis; Chronic shoulder pain (Right); Suprascapular  neuropathy (Right); DDD (degenerative disc disease), lumbosacral; Lumbar facet hypertrophy (Multilevel) (Bilateral); Spondylosis without myelopathy or radiculopathy, lumbosacral region; Inflammatory spondylopathy of lumbosacral region [facility]); Chronic lower extremity pain (Right); Chronic musculoskeletal pain; Abnormal MRI, lumbar spine (09/07/2017); Cervicalgia (Bilateral); Spondylosis without myelopathy or radiculopathy, cervical region; Myofascial pain syndrome of thoracic spine (serratus muscle) (Right); Chronic flank pain (Right); Abnormal result on screening urine test (03/27/20 & 08/20/20); Chronic hip pain (Bilateral); Chronic hip pain (Right); Enthesopathy of hip region (Right); Osteoarthritis of hip (Right); Trigger point of shoulder region (Right); and Pain of cervical facet joint on their pertinent problem list. Pain Assessment: Severity of   is reported as a  /10. Location:    / . Onset:  . Quality:  . Timing:  . Modifying factor(s):  Marland Kitchen Vitals:  vitals were not taken for this visit.  BMI: Estimated body mass index is 28.98 kg/m as calculated from the following:   Height as of 05/02/23: 5\' 7"  (1.702 m).   Weight as of 05/02/23: 185 lb (83.9 kg). Last encounter: 05/02/2023. Last procedure: 04/12/2023.  Reason for encounter: post-procedure evaluation and assessment. ***  Post-procedure evaluation   Procedure: Cervical Facet, Medial Branch Radiofrequency Ablation (RFA)  #1  Laterality: Right (-RT)  Level: C3, C4, C5, C6, and C7 Medial Branch Level(s). Lesioning of these levels should completely denervate the C3-4, C4-5, C5-6, and C6-7 cervical facet joints.  Imaging: Fluoroscopy-guided         Anesthesia: Local anesthesia (1-2% Lidocaine) Anxiolysis: IV Versed         Sedation: Moderate Sedation                       DOS: 04/12/2023  Performed by: Oswaldo Done, MD  Purpose: Therapeutic/Palliative Indications: Chronic neck pain (cervicalgia) severe enough to impact quality of life or  function.  Rationale (medical necessity): procedure needed and proper for the diagnosis and/or treatment of [name] medical symptoms and needs. Indications: 1. Cervical facet syndrome (Bilateral) (L>R)   2. Pain of cervical facet joint   3. Cervicalgia (Bilateral)   4. Spondylosis without myelopathy or radiculopathy, cervical region   5. Cervical DDD (C4-5, C5-6, C6-7 and C7-T1)        Effectiveness:  Initial hour after procedure:   ***. Subsequent 4-6 hours post-procedure:   ***. Analgesia past initial 6 hours:   ***. Ongoing improvement:  Analgesic:  *** Function:    ***    ROM:    ***     Pharmacotherapy Assessment  Analgesic: Hydrocodone/APAP 5/325, 1 tab PO QD (5 mg/day of hydrocodone) MME/day: 5 mg/day.   Monitoring: [place] PMP: PDMP reviewed during this encounter.       Pharmacotherapy: No side-effects or adverse reactions reported. Compliance: No problems identified. Effectiveness: Clinically acceptable.  No notes on file  No results found for: "CBDTHCR" No results found for: "D8THCCBX" No results found for: "D9THCCBX"  UDS:  Summary  Date Value Ref Range Status  05/02/2023 Note  Final    Comment:    ==================================================================== [employer] 13 (MW) ==================================================================== Test                             Result       Flag       Units  Drug Present and Declared for Prescription Verification   Norhydrocodone                 34           EXPECTED   ng/mg creat    Norhydrocodone is an expected metabolite of hydrocodone.  Drug Present not Declared for Prescription Verification   Oxycodone                      297          UNEXPECTED ng/mg creat   Oxymorphone                    90           UNEXPECTED ng/mg creat   Noroxycodone                   433          UNEXPECTED ng/mg creat   Noroxymorphone                 20           UNEXPECTED ng/mg creat    Sources of oxycodone  are scheduled prescription medications.    Oxymorphone, noroxycodone, and noroxymorphone are expected    metabolites of oxycodone. Oxymorphone is also available as a    scheduled prescription medication.  Drug Absent but Declared for Prescription Verification   Hydrocodone                    Not Detected UNEXPECTED ng/mg creat    Hydrocodone is almost always present in patients taking this drug    consistently. Absence of hydrocodone could be due to lapse of time    since the last dose or unusual pharmacokinetics (rapid metabolism).  ==================================================================== Test                      Result    Flag   Units  Ref Range   Creatinine              320              mg/dL      >=32 ==================================================================== Declared Medications:  The flagging and interpretation on this report are based on the  following declared medications.  Unexpected results may arise from  inaccuracies in the declared medications.   **Note: The testing scope of this panel includes these medications:   Hydrocodone (Norco)   **Note: The testing scope of this panel does not include the  following reported medications:   Acetaminophen (Tylenol)  Acetaminophen (Norco)  Atenolol (Tenormin)  Atorvastatin (Lipitor)  Baclofen (Lioresal)  Buspirone (Buspar)  Calcium  Chondroitin  Desloratadine (Clarinex-D)  Diphenhydramine  Docusate (Colace)  Duloxetine (Cymbalta)  Fenofibrate  Fluticasone (Flonase)  Glucosamine  Hydroxyzine (Atarax)  Ibuprofen (Advil)  Levalbuterol (Xopenex)  Magnesium (Mag-Ox)  Melatonin  Methylsulfonylmethane  Multivitamin  Naloxone (Narcan)  Omeprazole (Prilosec)  Ondansetron (Zofran)  Potassium  Pseudoephedrine (Clarinex-D)  Simethicone  Sucralfate (Carafate)  Supplement (Prevagen)  Vitamin B12  Vitamin D3  Vitamin E ==================================================================== For  clinical consultation, please call [phone]. ====================================================================       ROS  Constitutional: Denies any fever or chills Gastrointestinal: No reported hemesis, hematochezia, vomiting, or acute GI distress Musculoskeletal: Denies any acute onset joint swelling, redness, loss of ROM, or weakness Neurological: No reported episodes of acute onset apraxia, aphasia, dysarthria, agnosia, amnesia, paralysis, loss of coordination, or loss of consciousness  Medication Review  Apoaequorin, Calcium Carbonate, DULoxetine, Elderberry, Glucosamine-Chondroit-Vit C-Mn, HYDROcodone-acetaminophen, Methylsulfonylmethane, Potassium, Vitamin D3, acetaminophen, atenolol, atorvastatin, baclofen, busPIRone, cyanocobalamin, desloratadine-pseudoephedrine, diphenhydrAMINE HCl, docusate sodium, fenofibrate, fluticasone, hydrOXYzine, ibuprofen, levalbuterol, magnesium oxide, melatonin, multivitamin, naloxone, omeprazole, ondansetron, simethicone, sucralfate, and vitamin E  History Review  Allergy: Jose Washington has No Known Allergies. Drug: Jose Washington  reports no history of drug use. Alcohol:  reports no history of alcohol use. Tobacco:  reports that he has never smoked. He has never used smokeless tobacco. Social: Jose Washington  reports that he has never smoked. He has never used smokeless tobacco. He reports that he does not drink alcohol and does not use drugs. Medical:  has a past medical history of Allergy, Anxiety, Chronic duodenal ulcer with hemorrhage (2012), Chronic neck pain, Depression, GERD (gastroesophageal reflux disease), Hyperlipidemia, Hypertension, and Microscopic hematuria. Surgical: Jose Washington  has a past surgical history that includes eye muscle repair (1972 and 1975); bLEEDING ULCER (2012); Flexible sigmoidoscopy; Colonoscopy with propofol (N/A, 03/24/2021); and Esophagogastroduodenoscopy (egd) with propofol (N/A, 03/24/2021). Family: family history  includes Allergies in his brother; Cancer in his maternal grandfather and mother; Dementia in his father and paternal grandfather; Depression in his father; Diabetes in his mother; Mental illness in his father; Stroke in his paternal grandfather.  Laboratory Chemistry Profile   Renal Lab Results  Component Value Date   BUN 16 02/22/2023   CREATININE 1.07 02/22/2023   BCR 15 02/22/2023   GFRAA 84 12/24/2020   GFRNONAA >60 02/08/2021    Hepatic Lab Results  Component Value Date   AST 37 02/22/2023   ALT 37 02/22/2023   ALBUMIN 4.5 02/22/2023   ALKPHOS 60 02/22/2023   LIPASE 28 02/08/2021    Electrolytes Lab Results  Component Value Date   NA 140 02/22/2023   K 4.9 02/22/2023   CL 104 02/22/2023   CALCIUM 9.8 02/22/2023   MG 2.0 11/12/2016    Bone Lab Results  Component Value Date  VD25OH 58.2 02/22/2023   25OHVITD1 23 (L) 11/12/2016   25OHVITD2 <1.0 11/12/2016   25OHVITD3 23 11/12/2016    Inflammation (CRP: Acute Phase) (ESR: Chronic Phase) Lab Results  Component Value Date   CRP 1.4 (H) 11/12/2016   ESRSEDRATE 3 11/12/2016         Note: Above Lab results reviewed.  Recent Imaging Review  DG Cervical Spine With Flex & Extend CLINICAL DATA:  Cervicalgia.  Neck pain.  EXAM: CERVICAL SPINE COMPLETE WITH FLEXION AND EXTENSION VIEWS  COMPARISON:  Cervical spine radiograph 11/12/2016  FINDINGS: There is 2-3 mm grade 1 anterolisthesis of C4 on C5, unchanged on flexion and extension. 1-2 mm anterolisthesis of C3 on C4 is unchanged on flexion, 0 mm on extension. 2-3 mm anterolisthesis of C5 on C6 is unchanged on flexion, 0 mm on extension. Moderate diffuse degenerative disc disease, most prominently affecting C6-C7 and C7-T1. C7-T1 degenerative changes have progressed from 2018. Advanced diffuse facet hypertrophy. Suggestion of diffuse bony neural foraminal narrowing, not well assessed due to positioning. The dens is not well assessed on provided views. No  evidence of fracture or focal bone abnormality. No prevertebral soft tissue thickening.  IMPRESSION: 1. Moderate diffuse degenerative disc disease, most prominently affecting C6-C7 and C7-T1. 2. Advanced diffuse facet hypertrophy. 3. Suggestion of diffuse bony neural foraminal narrowing, not well assessed due to positioning. 4. Grade 1 anterolisthesis of C4 on C5 is unchanged on flexion and extension. Grade 1 anterolisthesis of C3 on C4 and C5 on C6 is unchanged on flexion, 0 mm on extension.  Electronically Signed   By: Narda Rutherford M.D.   On: 04/17/2023 14:28 Note: Reviewed        Physical Exam  General appearance: Well nourished, well developed, and well hydrated. In no apparent acute distress Mental status: Alert, oriented x 3 (person, place, & time)       Respiratory: No evidence of acute respiratory distress Eyes: PERLA Vitals: There were no vitals taken for this visit. BMI: Estimated body mass index is 28.98 kg/m as calculated from the following:   Height as of 05/02/23: 5\' 7"  (1.702 m).   Weight as of 05/02/23: 185 lb (83.9 kg). Ideal: Patient weight not recorded  Assessment   Diagnosis Status  1. Chronic neck pain (3ry area of Pain) (Bilateral) (L>R)   2. Chronic shoulder pain (Bilateral) (L>R)   3. Pain of cervical facet joint   4. Cervical facet syndrome (Bilateral) (L>R)   5. Postop check    Controlled Controlled Controlled   Updated Problems: No problems updated.  Plan of Care  Problem-specific:  No problem-specific Assessment & Plan notes found for this encounter.  Jose Washington has a current medication list which includes the following long-term medication(s): atenolol, atorvastatin, baclofen, calcium carbonate, diphenhydramine hcl, duloxetine, fenofibrate, hydrocodone-acetaminophen, [START ON 06/07/2023] hydrocodone-acetaminophen, [START ON 07/07/2023] hydrocodone-acetaminophen, levalbuterol, omeprazole, potassium, simethicone, and  sucralfate.  Pharmacotherapy (Medications Ordered): No orders of the defined types were placed in this encounter.  Orders:  No orders of the defined types were placed in this encounter.  Follow-up plan:   No follow-ups on file.      Interventional Therapies  Risk Factors  Considerations:   Abnormal UDS (03/27/2020) & (08/20/20) (+) undisclosed carboxy-THC    Planned  Pending:      Under consideration:   Therapeutic right IA hip joint injection #2  Therapeutic right lumbar facet RFA #2    Completed:   Diagnostic/therapeutic right IA hip inj. x1 (12/09/2020) (100/100/95/90-95)  Therapeutic right lumbar facet RFA x2 (11/26/2021) (100/100/75/75)  Palliative right lumbar facet MBB x2 (06/07/2019) (100/100/100 x20 days/>75 x3 months)  Diagnostic/therapeutic left lumbar facet MBB x1 (06/07/2019) (100/100/100 x20 days/>75 x3 months)  Diagnostic/therapeutic left L4-5 LESI x2 (07/25/2018) (n/a)  Palliative left CESI x3 (02/16/2018) (100/100/100/100)  Diagnostic right cervical facet MBB x2 (08/19/2022) (100/100/100/100) (w/ local anesthetic only)  Diagnostic left cervical facet MBB x1 (01/10/2020) (100/100/100)  Therapeutic right cervical facet RFA x1 (04/12/2023) (100/100/90/90)  Diagnostic/therapeutic right shoulder region TPI/MNB x2 (07/19/2022) (100/100/100/100)    Therapeutic  Palliative (PRN) options:   Therapeutic lumbar facet RFA   Therapeutic/palliative lumbar facet MBB   Therapeutic L4-5 LESI   Therapeutic CESI   Therapeutic right IA hip joint injection #2  Diagnostic cervical facet MBB #2    Pharmacotherapy  Nonopioids transferred 08/20/2020: Baclofen        Recent Visits Date Type Provider Dept  05/02/23 Office Visit Delano Metz, MD [facility]  04/12/23 Procedure visit Delano Metz, MD [facility]  Showing recent visits within past 90 days and meeting all other requirements Future Appointments Date Type Provider Dept  06/07/23  Appointment Delano Metz, MD [facility]  07/27/23 Appointment Delano Metz, MD [facility]  Showing future appointments within next 90 days and meeting all other requirements  I discussed the assessment and treatment plan with the patient. The patient was provided an opportunity to ask questions and all were answered. The patient agreed with the plan and demonstrated an understanding of the instructions.  Patient advised to call back or seek an in-person evaluation if the symptoms or condition worsens.  Duration of encounter: *** minutes.  Total time on encounter, as per AMA guidelines included both the face-to-face and non-face-to-face time personally spent by the physician and/or other qualified health care professional(s) on the day of the encounter (includes time in activities that require the physician or other qualified health care professional and does not include time in activities normally performed by clinical staff). Physician's time may include the following activities when performed: Preparing to see the patient (e.g., pre-charting review of records, searching for previously ordered imaging, lab work, and nerve conduction tests) Review of prior analgesic pharmacotherapies. Reviewing PMP Interpreting ordered tests (e.g., lab work, imaging, nerve conduction tests) Performing post-procedure evaluations, including interpretation of diagnostic procedures Obtaining and/or reviewing separately obtained history Performing a medically appropriate examination and/or evaluation Counseling and educating the patient/family/caregiver Ordering medications, tests, or procedures Referring and communicating with other health care professionals (when not separately reported) Documenting clinical information in the electronic or other health record Independently interpreting results (not separately reported) and communicating results to the patient/  family/caregiver Care coordination (not separately reported)  Note by: Oswaldo Done, MD Date: 06/07/2023; Time: 8:19 PM

## 2023-06-07 ENCOUNTER — Ambulatory Visit: Payer: No Typology Code available for payment source | Attending: Pain Medicine | Admitting: Pain Medicine

## 2023-06-07 ENCOUNTER — Encounter: Payer: Self-pay | Admitting: Pain Medicine

## 2023-06-07 VITALS — BP 113/84 | HR 58 | Temp 98.1°F | Ht 67.0 in | Wt 178.0 lb

## 2023-06-07 DIAGNOSIS — M5412 Radiculopathy, cervical region: Secondary | ICD-10-CM | POA: Diagnosis present

## 2023-06-07 DIAGNOSIS — M47812 Spondylosis without myelopathy or radiculopathy, cervical region: Secondary | ICD-10-CM | POA: Diagnosis present

## 2023-06-07 DIAGNOSIS — M4802 Spinal stenosis, cervical region: Secondary | ICD-10-CM | POA: Insufficient documentation

## 2023-06-07 DIAGNOSIS — M542 Cervicalgia: Secondary | ICD-10-CM | POA: Insufficient documentation

## 2023-06-07 DIAGNOSIS — M5030 Other cervical disc degeneration, unspecified cervical region: Secondary | ICD-10-CM

## 2023-06-07 DIAGNOSIS — M25512 Pain in left shoulder: Secondary | ICD-10-CM | POA: Insufficient documentation

## 2023-06-07 DIAGNOSIS — M25511 Pain in right shoulder: Secondary | ICD-10-CM | POA: Diagnosis not present

## 2023-06-07 DIAGNOSIS — G8929 Other chronic pain: Secondary | ICD-10-CM | POA: Insufficient documentation

## 2023-06-07 DIAGNOSIS — Z09 Encounter for follow-up examination after completed treatment for conditions other than malignant neoplasm: Secondary | ICD-10-CM | POA: Diagnosis present

## 2023-06-07 NOTE — Progress Notes (Unsigned)
Safety precautions to be maintained throughout the outpatient stay will include: orient to surroundings, keep bed in low position, maintain call bell within reach at all times, provide assistance with transfer out of bed and ambulation.  

## 2023-06-07 NOTE — Telephone Encounter (Signed)
Requested medication (s) are due for refill today: Yes  Requested medication (s) are on the active medication list: Yes  Last refill:  12/03/22  Future visit scheduled: Yes  Notes to clinic:  Not delegated.    Requested Prescriptions  Pending Prescriptions Disp Refills   ondansetron (ZOFRAN) 8 MG tablet [Pharmacy Med Name: ONDANSETRON HCL 8 MG TABLET] 180 tablet 1    Sig: TAKE 1 TABLET (8 MG TOTAL) BY MOUTH 2 (TWO) TIMES DAILY.     Not Delegated - Gastroenterology: Antiemetics - ondansetron Failed - 06/06/2023  2:34 AM      Failed - This refill cannot be delegated      Passed - AST in normal range and within 360 days    AST  Date Value Ref Range Status  02/22/2023 37 0 - 40 IU/L Final         Passed - ALT in normal range and within 360 days    ALT  Date Value Ref Range Status  02/22/2023 37 0 - 44 IU/L Final         Passed - Valid encounter within last 6 months    Recent Outpatient Visits           1 month ago Cervical DDD (C4-5, C5-6, C6-7 and C7-T1)   [place] [facility] [place], Megan P, DO   3 months ago Annual physical exam   [place] [facility] Larae Grooms, NP   11 months ago Aortic atherosclerosis [facility])   [place] [facility] Larae Grooms, NP   1 year ago Annual physical exam   [place] [facility] Larae Grooms, NP   1 year ago Upper respiratory tract infection, unspecified type   [place] [facility] Gerre Scull, NP       Future Appointments             In 3 months Larae Grooms, NP [place] [facility]

## 2023-06-07 NOTE — Patient Instructions (Signed)

## 2023-06-16 ENCOUNTER — Telehealth: Payer: Self-pay

## 2023-06-16 NOTE — Telephone Encounter (Signed)
Insurance Treatment Denial Note  Date order was entered:  Order entered by: Delano Metz, MD Requested treatment: cesi Reason for denial: Documentation of pertinent physical exam is missing. Recommended for approval:  see below   The letter states: "Your doctor told us you have nerve pain coming from your neck. A shot to treat this was  requested. We cannot approve this request. The notes do not show: A doctor exam that shows pain, numbness, tingling, and/or weakness from the spine to the  arm(s) and/or legs in a pattern related to the level of the spine to be treated.

## 2023-07-25 NOTE — Progress Notes (Unsigned)
PROVIDER NOTE: Information contained herein reflects review and annotations entered in association with encounter. Interpretation of such information and data should be left to medically-trained personnel. Information provided to patient can be located elsewhere in the medical record under "Patient Instructions". Document created using STT-dictation technology, any transcriptional errors that may result from process are unintentional.    Patient: Jose Washington  Service Category: E/M  Provider: Oswaldo Done, MD  DOB: Feb 01, 1966  DOS: 07/27/2023  Referring Provider: Larae Grooms, NP  MRN: [id number]  Specialty: Interventional Pain Management  PCP: Larae Grooms, NP  Type: Established Patient  Setting: Ambulatory outpatient    Location: Office  Delivery: Face-to-face     HPI  Mr. EVERTT GOLBERG, a 57 y.o. year old male, is here today because of his No primary diagnosis found.. Mr. Kettner primary complain today is No chief complaint on file.  Pertinent problems: Mr. Shyne has Chronic neck pain (3ry area of Pain) (Bilateral); Chronic pain syndrome; Chronic low back pain (1ry area of Pain) (Bilateral) (L>R) w/o sciatica ; Chronic upper back pain (4th area of Pain) (Bilateral); Chronic shoulder pain (Bilateral) (L>R); Osteoarthritis of AC (acromioclavicular) joint (Right); Chronic sacroiliac joint pain (Bilateral) (L>R); Muscle spasticity; Cervical DDD (C4-5, C5-6, C6-7 and C7-T1); Cervical foraminal stenosis (Bilateral: C5-6 & C6-7, Left: C4-5 & C7-T1); Cervical radiculitis (Bilateral) (R>L); Cervical facet syndrome (Bilateral) (L>R); Cervical spondylosis; Musculoskeletal neck pain (trapezius); Chronic knee pain (Left); Chronic lower extremity pain (2ry area of Pain) (Left); Grade 1 Retrolisthesis of L3 over L4; Lumbar facet arthropathy (Bilateral); Lumbar facet osteoarthritis; Lumbar facet syndrome (Bilateral) (R>L); Lumbar spondylosis; Chronic shoulder pain (Right); Suprascapular  neuropathy (Right); DDD (degenerative disc disease), lumbosacral; Lumbar facet hypertrophy (Multilevel) (Bilateral); Spondylosis without myelopathy or radiculopathy, lumbosacral region; Inflammatory spondylopathy of lumbosacral region [facility]); Chronic lower extremity pain (Right); Chronic musculoskeletal pain; Abnormal MRI, lumbar spine (09/07/2017); Cervicalgia (Bilateral); Spondylosis without myelopathy or radiculopathy, cervical region; Myofascial pain syndrome of thoracic spine (serratus muscle) (Right); Chronic flank pain (Right); Abnormal result on screening urine test (03/27/20 & 08/20/20); Chronic hip pain (Bilateral); Chronic hip pain (Right); Enthesopathy of hip region (Right); Osteoarthritis of hip (Right); Trigger point of shoulder region (Right); and Pain of cervical facet joint on their pertinent problem list. Pain Assessment: Severity of   is reported as a  /10. Location:    / . Onset:  . Quality:  . Timing:  . Modifying factor(s):  Marland Kitchen Vitals:  vitals were not taken for this visit.  BMI: Estimated body mass index is 27.88 kg/m as calculated from the following:   Height as of 06/07/23: 5\' 7"  (1.702 m).   Weight as of 06/07/23: 178 lb (80.7 kg). Last encounter: 06/07/2023. Last procedure: 04/12/2023.  Reason for encounter: medication management. ***  RTCB:   Pharmacotherapy Assessment  Analgesic: Hydrocodone/APAP 5/325, 1 tab PO QD (5 mg/day of hydrocodone) MME/day: 5 mg/day.   Monitoring: [place] PMP: PDMP reviewed during this encounter.       Pharmacotherapy: No side-effects or adverse reactions reported. Compliance: No problems identified. Effectiveness: Clinically acceptable.  No notes on file  No results found for: "CBDTHCR" No results found for: "D8THCCBX" No results found for: "D9THCCBX"  UDS:  Summary  Date Value Ref Range Status  05/02/2023 Note  Final    Comment:    ==================================================================== [employer] [facility]  (MW) ==================================================================== Test  Result       Flag       Units  Drug Present and Declared for Prescription Verification   Norhydrocodone                 34           EXPECTED   ng/mg creat    Norhydrocodone is an expected metabolite of hydrocodone.  Drug Present not Declared for Prescription Verification   Oxycodone                      297          UNEXPECTED ng/mg creat   Oxymorphone                    90           UNEXPECTED ng/mg creat   Noroxycodone                   433          UNEXPECTED ng/mg creat   Noroxymorphone                 20           UNEXPECTED ng/mg creat    Sources of oxycodone are scheduled prescription medications.    Oxymorphone, noroxycodone, and noroxymorphone are expected    metabolites of oxycodone. Oxymorphone is also available as a    scheduled prescription medication.  Drug Absent but Declared for Prescription Verification   Hydrocodone                    Not Detected UNEXPECTED ng/mg creat    Hydrocodone is almost always present in patients taking this drug    consistently. Absence of hydrocodone could be due to lapse of time    since the last dose or unusual pharmacokinetics (rapid metabolism).  ==================================================================== Test                      Result    Flag   Units      Ref Range   Creatinine              320              mg/dL      >=81 ==================================================================== Declared Medications:  The flagging and interpretation on this report are based on the  following declared medications.  Unexpected results may arise from  inaccuracies in the declared medications.   **Note: The testing scope of this panel includes these medications:   Hydrocodone (Norco)   **Note: The testing scope of this panel does not include the  following reported medications:   Acetaminophen (Tylenol)  Acetaminophen  (Norco)  Atenolol (Tenormin)  Atorvastatin (Lipitor)  Baclofen (Lioresal)  Buspirone (Buspar)  Calcium  Chondroitin  Desloratadine (Clarinex-D)  Diphenhydramine  Docusate (Colace)  Duloxetine (Cymbalta)  Fenofibrate  Fluticasone (Flonase)  Glucosamine  Hydroxyzine (Atarax)  Ibuprofen (Advil)  Levalbuterol (Xopenex)  Magnesium (Mag-Ox)  Melatonin  Methylsulfonylmethane  Multivitamin  Naloxone (Narcan)  Omeprazole (Prilosec)  Ondansetron (Zofran)  Potassium  Pseudoephedrine (Clarinex-D)  Simethicone  Sucralfate (Carafate)  Supplement (Prevagen)  Vitamin B12  Vitamin D3  Vitamin E ==================================================================== For clinical consultation, please call [phone]. ====================================================================       ROS  Constitutional: Denies any fever or chills Gastrointestinal: No reported hemesis, hematochezia, vomiting, or acute GI distress Musculoskeletal: Denies any acute onset joint swelling, redness, loss  of ROM, or weakness Neurological: No reported episodes of acute onset apraxia, aphasia, dysarthria, agnosia, amnesia, paralysis, loss of coordination, or loss of consciousness  Medication Review  Apoaequorin, Calcium Carbonate, DULoxetine, Elderberry, Glucosamine-Chondroit-Vit C-Mn, HYDROcodone-acetaminophen, Methylsulfonylmethane, Potassium, Vitamin D3, acetaminophen, atenolol, atorvastatin, baclofen, busPIRone, cyanocobalamin, desloratadine-pseudoephedrine, diphenhydrAMINE HCl, docusate sodium, fenofibrate, fluticasone, hydrOXYzine, ibuprofen, levalbuterol, magnesium oxide, melatonin, multivitamin, naloxone, omeprazole, ondansetron, simethicone, sucralfate, and vitamin E  History Review  Allergy: Mr. Tosti has No Known Allergies. Drug: Mr. Fruin  reports no history of drug use. Alcohol:  reports no history of alcohol use. Tobacco:  reports that he has never smoked. He has never used smokeless  tobacco. Social: Mr. Barbati  reports that he has never smoked. He has never used smokeless tobacco. He reports that he does not drink alcohol and does not use drugs. Medical:  has a past medical history of Allergy, Anxiety, Chronic duodenal ulcer with hemorrhage (2012), Chronic neck pain, Depression, GERD (gastroesophageal reflux disease), Hyperlipidemia, Hypertension, and Microscopic hematuria. Surgical: Mr. Niska  has a past surgical history that includes eye muscle repair (1972 and 1975); bLEEDING ULCER (2012); Flexible sigmoidoscopy; Colonoscopy with propofol (N/A, 03/24/2021); and Esophagogastroduodenoscopy (egd) with propofol (N/A, 03/24/2021). Family: family history includes Allergies in his brother; Cancer in his maternal grandfather and mother; Dementia in his father and paternal grandfather; Depression in his father; Diabetes in his mother; Mental illness in his father; Stroke in his paternal grandfather.  Laboratory Chemistry Profile   Renal Lab Results  Component Value Date   BUN 16 02/22/2023   CREATININE 1.07 02/22/2023   BCR 15 02/22/2023   GFRAA 84 12/24/2020   GFRNONAA >60 02/08/2021    Hepatic Lab Results  Component Value Date   AST 37 02/22/2023   ALT 37 02/22/2023   ALBUMIN 4.5 02/22/2023   ALKPHOS 60 02/22/2023   LIPASE 28 02/08/2021    Electrolytes Lab Results  Component Value Date   NA 140 02/22/2023   K 4.9 02/22/2023   CL 104 02/22/2023   CALCIUM 9.8 02/22/2023   MG 2.0 11/12/2016    Bone Lab Results  Component Value Date   VD25OH 58.2 02/22/2023   25OHVITD1 23 (L) 11/12/2016   25OHVITD2 <1.0 11/12/2016   25OHVITD3 23 11/12/2016    Inflammation (CRP: Acute Phase) (ESR: Chronic Phase) Lab Results  Component Value Date   CRP 1.4 (H) 11/12/2016   ESRSEDRATE 3 11/12/2016         Note: Above Lab results reviewed.  Recent Imaging Review  DG Cervical Spine With Flex & Extend CLINICAL DATA:  Cervicalgia.  Neck pain.  EXAM: CERVICAL SPINE  COMPLETE WITH FLEXION AND EXTENSION VIEWS  COMPARISON:  Cervical spine radiograph 11/12/2016  FINDINGS: There is 2-3 mm grade 1 anterolisthesis of C4 on C5, unchanged on flexion and extension. 1-2 mm anterolisthesis of C3 on C4 is unchanged on flexion, 0 mm on extension. 2-3 mm anterolisthesis of C5 on C6 is unchanged on flexion, 0 mm on extension. Moderate diffuse degenerative disc disease, most prominently affecting C6-C7 and C7-T1. C7-T1 degenerative changes have progressed from 2018. Advanced diffuse facet hypertrophy. Suggestion of diffuse bony neural foraminal narrowing, not well assessed due to positioning. The dens is not well assessed on provided views. No evidence of fracture or focal bone abnormality. No prevertebral soft tissue thickening.  IMPRESSION: 1. Moderate diffuse degenerative disc disease, most prominently affecting C6-C7 and C7-T1. 2. Advanced diffuse facet hypertrophy. 3. Suggestion of diffuse bony neural foraminal narrowing, not well assessed due to positioning. 4. Grade  1 anterolisthesis of C4 on C5 is unchanged on flexion and extension. Grade 1 anterolisthesis of C3 on C4 and C5 on C6 is unchanged on flexion, 0 mm on extension.  Electronically Signed   By: Narda Rutherford M.D.   On: 04/17/2023 14:28 Note: Reviewed        Physical Exam  General appearance: Well nourished, well developed, and well hydrated. In no apparent acute distress Mental status: Alert, oriented x 3 (person, place, & time)       Respiratory: No evidence of acute respiratory distress Eyes: PERLA Vitals: There were no vitals taken for this visit. BMI: Estimated body mass index is 27.88 kg/m as calculated from the following:   Height as of 06/07/23: 5\' 7"  (1.702 m).   Weight as of 06/07/23: 178 lb (80.7 kg). Ideal: Patient weight not recorded  Assessment   Diagnosis Status  No diagnosis found. Controlled Controlled Controlled   Updated Problems: No problems updated.  Plan  of Care  Problem-specific:  No problem-specific Assessment & Plan notes found for this encounter.  Mr. DARRYLE MAHFOUZ has a current medication list which includes the following long-term medication(s): atenolol, atorvastatin, baclofen, calcium carbonate, diphenhydramine hcl, duloxetine, fenofibrate, hydrocodone-acetaminophen, hydrocodone-acetaminophen, hydrocodone-acetaminophen, levalbuterol, omeprazole, potassium, simethicone, and sucralfate.  Pharmacotherapy (Medications Ordered): No orders of the defined types were placed in this encounter.  Orders:  No orders of the defined types were placed in this encounter.  Follow-up plan:   No follow-ups on file.      Interventional Therapies  Risk Factors  Considerations:   Abnormal UDS (03/27/2020) & (08/20/20) (+) undisclosed carboxy-THC    Planned  Pending:      Under consideration:   Therapeutic right IA hip joint injection #2  Therapeutic right lumbar facet RFA #2    Completed:   Diagnostic/therapeutic right IA hip inj. x1 (12/09/2020) (100/100/95/90-95)  Therapeutic right lumbar facet RFA x2 (11/26/2021) (100/100/75/75)  Palliative right lumbar facet MBB x2 (06/07/2019) (100/100/100 x20 days/>75 x3 months)  Diagnostic/therapeutic left lumbar facet MBB x1 (06/07/2019) (100/100/100 x20 days/>75 x3 months)  Diagnostic/therapeutic left L4-5 LESI x2 (07/25/2018) (n/a)  Palliative left CESI x3 (02/16/2018) (100/100/100/100)  Diagnostic right cervical facet MBB x2 (08/19/2022) (100/100/100/100) (w/ local anesthetic only)  Diagnostic left cervical facet MBB x1 (01/10/2020) (100/100/100)  Therapeutic right cervical facet RFA x1 (04/12/2023) (100/100/90/90)  Diagnostic/therapeutic right shoulder region TPI/MNB x2 (07/19/2022) (100/100/100/100)    Therapeutic  Palliative (PRN) options:   Therapeutic lumbar facet RFA   Therapeutic/palliative lumbar facet MBB   Therapeutic L4-5 LESI   Therapeutic CESI   Therapeutic right IA hip joint  injection #2  Diagnostic cervical facet MBB #2    Pharmacotherapy  Nonopioids transferred 08/20/2020: Baclofen        Recent Visits Date Type Provider Dept  06/07/23 Office Visit Delano Metz, MD [facility]  05/02/23 Office Visit Delano Metz, MD [facility]  Showing recent visits within past 90 days and meeting all other requirements Future Appointments Date Type Provider Dept  07/27/23 Appointment Delano Metz, MD [facility]  Showing future appointments within next 90 days and meeting all other requirements  I discussed the assessment and treatment plan with the patient. The patient was provided an opportunity to ask questions and all were answered. The patient agreed with the plan and demonstrated an understanding of the instructions.  Patient advised to call back or seek an in-person evaluation if the symptoms or condition worsens.  Duration of encounter: *** minutes.  Total time  on encounter, as per AMA guidelines included both the face-to-face and non-face-to-face time personally spent by the physician and/or other qualified health care professional(s) on the day of the encounter (includes time in activities that require the physician or other qualified health care professional and does not include time in activities normally performed by clinical staff). Physician's time may include the following activities when performed: Preparing to see the patient (e.g., pre-charting review of records, searching for previously ordered imaging, lab work, and nerve conduction tests) Review of prior analgesic pharmacotherapies. Reviewing PMP Interpreting ordered tests (e.g., lab work, imaging, nerve conduction tests) Performing post-procedure evaluations, including interpretation of diagnostic procedures Obtaining and/or reviewing separately obtained history Performing a medically appropriate examination and/or evaluation Counseling and  educating the patient/family/caregiver Ordering medications, tests, or procedures Referring and communicating with other health care professionals (when not separately reported) Documenting clinical information in the electronic or other health record Independently interpreting results (not separately reported) and communicating results to the patient/ family/caregiver Care coordination (not separately reported)  Note by: Oswaldo Done, MD Date: 07/27/2023; Time: 7:50 PM

## 2023-07-27 ENCOUNTER — Ambulatory Visit (HOSPITAL_BASED_OUTPATIENT_CLINIC_OR_DEPARTMENT_OTHER): Payer: No Typology Code available for payment source | Admitting: Pain Medicine

## 2023-07-27 DIAGNOSIS — Z79899 Other long term (current) drug therapy: Secondary | ICD-10-CM

## 2023-07-27 DIAGNOSIS — Z91199 Patient's noncompliance with other medical treatment and regimen due to unspecified reason: Secondary | ICD-10-CM

## 2023-07-27 NOTE — Patient Instructions (Signed)
____________________________________________________________________________________________  Opioid Pain Medication Update  To: All patients taking opioid pain medications. (I.e.: hydrocodone, hydromorphone, oxycodone, oxymorphone, morphine, codeine, methadone, tapentadol, tramadol, buprenorphine, fentanyl, etc.)  Re: Updated review of side effects and adverse reactions of opioid analgesics, as well as new information about long term effects of this class of medications.  Direct risks of long-term opioid therapy are not limited to opioid addiction and overdose. Potential medical risks include serious fractures, breathing problems during sleep, hyperalgesia, immunosuppression, chronic constipation, bowel obstruction, myocardial infarction, and tooth decay secondary to xerostomia.  Unpredictable adverse effects that can occur even if you take your medication correctly: Cognitive impairment, respiratory depression, and death. Most people think that if they take their medication "correctly", and "as instructed", that they will be safe. Nothing could be farther from the truth. In reality, a significant amount of recorded deaths associated with the use of opioids has occurred in individuals that had taken the medication for a long time, and were taking their medication correctly. The following are examples of how this can happen: Patient taking his/her medication for a long time, as instructed, without any side effects, is given a certain antibiotic or another unrelated medication, which in turn triggers a "Drug-to-drug interaction" leading to disorientation, cognitive impairment, impaired reflexes, respiratory depression or an untoward event leading to serious bodily harm or injury, including death.  Patient taking his/her medication for a long time, as instructed, without any side effects, develops an acute impairment of liver and/or kidney function. This will lead to a rapid inability of the body to  breakdown and eliminate their pain medication, which will result in effects similar to an "overdose", but with the same medicine and dose that they had always taken. This again may lead to disorientation, cognitive impairment, impaired reflexes, respiratory depression or an untoward event leading to serious bodily harm or injury, including death.  A similar problem will occur with patients as they grow older and their liver and kidney function begins to decrease as part of the aging process.  Background information: Historically, the original case for using long-term opioid therapy to treat chronic noncancer pain was based on safety assumptions that subsequent experience has called into question. In 1996, the American Pain Society and the American Academy of Pain Medicine issued a consensus statement supporting long-term opioid therapy. This statement acknowledged the dangers of opioid prescribing but concluded that the risk for addiction was low; respiratory depression induced by opioids was short-lived, occurred mainly in opioid-naive patients, and was antagonized by pain; tolerance was not a common problem; and efforts to control diversion should not constrain opioid prescribing. This has now proven to be wrong. Experience regarding the risks for opioid addiction, misuse, and overdose in community practice has failed to support these assumptions.  According to the Centers for Disease Control and Prevention, fatal overdoses involving opioid analgesics have increased sharply over the past decade. Currently, more than 96,700 people die from drug overdoses every year. Opioids are a factor in 7 out of every 10 overdose deaths. Deaths from drug overdose have surpassed motor vehicle accidents as the leading cause of death for individuals between the ages of 80 and 61.  Clinical data suggest that neuroendocrine dysfunction may be very common in both men and women, potentially causing hypogonadism, erectile  dysfunction, infertility, decreased libido, osteoporosis, and depression. Recent studies linked higher opioid dose to increased opioid-related mortality. Controlled observational studies reported that long-term opioid therapy may be associated with increased risk for cardiovascular events. Subsequent meta-analysis concluded  that the safety of long-term opioid therapy in elderly patients has not been proven.   Side Effects and adverse reactions: Common side effects: Drowsiness (sedation). Dizziness. Nausea and vomiting. Constipation. Physical dependence -- Dependence often manifests with withdrawal symptoms when opioids are discontinued or decreased. Tolerance -- As you take repeated doses of opioids, you require increased medication to experience the same effect of pain relief. Respiratory depression -- This can occur in healthy people, especially with higher doses. However, people with COPD, asthma or other lung conditions may be even more susceptible to fatal respiratory impairment.  Uncommon side effects: An increased sensitivity to feeling pain and extreme response to pain (hyperalgesia). Chronic use of opioids can lead to this. Delayed gastric emptying (the process by which the contents of your stomach are moved into your small intestine). Muscle rigidity. Immune system and hormonal dysfunction. Quick, involuntary muscle jerks (myoclonus). Arrhythmia. Itchy skin (pruritus). Dry mouth (xerostomia).  Long-term side effects: Chronic constipation. Sleep-disordered breathing (SDB). Increased risk of bone fractures. Hypothalamic-pituitary-adrenal dysregulation. Increased risk of overdose.  RISKS: Respiratory depression and death: Opioids increase the risk of respiratory depression and death.  Drug-to-drug interactions: Opioids are relatively contraindicated in combination with benzodiazepines, sleep inducers, and other central nervous system depressants. Other classes of medications  (i.e.: certain antibiotics and even over-the-counter medications) may also trigger or induce respiratory depression in some patients.  Medical conditions: Patients with pre-existing respiratory problems are at higher risk of respiratory failure and/or depression when in combination with opioid analgesics. Opioids are relatively contraindicated in some medical conditions such as central sleep apnea.   Fractures and Falls:  Opioids increase the risk and incidence of falls. This is of particular importance in elderly patients.  Endocrine System:  Long-term administration is associated with endocrine abnormalities (endocrinopathies). (Also known as Opioid-induced Endocrinopathy) Influences on both the hypothalamic-pituitary-adrenal axis?and the hypothalamic-pituitary-gonadal axis have been demonstrated with consequent hypogonadism and adrenal insufficiency in both sexes. Hypogonadism and decreased levels of dehydroepiandrosterone sulfate have been reported in men and women. Endocrine effects include: Amenorrhoea in women (abnormal absence of menstruation) Reduced libido in both sexes Decreased sexual function Erectile dysfunction in men Hypogonadisms (decreased testicular function with shrinkage of testicles) Infertility Depression and fatigue Loss of muscle mass Anxiety Depression Immune suppression Hyperalgesia Weight gain Anemia Osteoporosis Patients (particularly women of childbearing age) should avoid opioids. There is insufficient evidence to recommend routine monitoring of asymptomatic patients taking opioids in the long-term for hormonal deficiencies.  Immune System: Human studies have demonstrated that opioids have an immunomodulating effect. These effects are mediated via opioid receptors both on immune effector cells and in the central nervous system. Opioids have been demonstrated to have adverse effects on antimicrobial response and anti-tumour surveillance. Buprenorphine has  been demonstrated to have no impact on immune function.  Opioid Induced Hyperalgesia: Human studies have demonstrated that prolonged use of opioids can lead to a state of abnormal pain sensitivity, sometimes called opioid induced hyperalgesia (OIH). Opioid induced hyperalgesia is not usually seen in the absence of tolerance to opioid analgesia. Clinically, hyperalgesia may be diagnosed if the patient on long-term opioid therapy presents with increased pain. This might be qualitatively and anatomically distinct from pain related to disease progression or to breakthrough pain resulting from development of opioid tolerance. Pain associated with hyperalgesia tends to be more diffuse than the pre-existing pain and less defined in quality. Management of opioid induced hyperalgesia requires opioid dose reduction.  Cancer: Chronic opioid therapy has been associated with an increased risk of cancer  among noncancer patients with chronic pain. This association was more evident in chronic strong opioid users. Chronic opioid consumption causes significant pathological changes in the small intestine and colon. Epidemiological studies have found that there is a link between opium dependence and initiation of gastrointestinal cancers. Cancer is the second leading cause of death after cardiovascular disease. Chronic use of opioids can cause multiple conditions such as GERD, immunosuppression and renal damage as well as carcinogenic effects, which are associated with the incidence of cancers.   Mortality: Long-term opioid use has been associated with increased mortality among patients with chronic non-cancer pain (CNCP).  Prescription of long-acting opioids for chronic noncancer pain was associated with a significantly increased risk of all-cause mortality, including deaths from causes other than overdose.  Reference: Von Korff M, Kolodny A, Deyo RA, Chou R. Long-term opioid therapy reconsidered. Ann Intern Med. 2011  Sep 6;155(5):325-8. doi: 10.7326/[id number]. PMID: 64403474; PMCID: [id number]. Randon Goldsmith, Hayward RA, Dunn KM, [place] KP. Risk of adverse events in patients prescribed long-term opioids: A cohort study in the [place] [facility] Research Datalink. Eur J Pain. 2019 May;23(5):908-922. doi: 10.1002/ejp.1357. Epub 2019 Jan 31. PMID: 43329518. Colameco S, Coren JS, Ciervo CA. Continuous opioid treatment for chronic noncancer pain: a time for moderation in prescribing. Postgrad Med. 2009 Jul;121(4):61-6. doi: 10.3810/pgm.2009.07.2032. PMID: 84166063. William Hamburger RN, [place] SD, Blazina I, Cristopher Peru, Bougatsos C, Deyo RA. The effectiveness and risks of long-term opioid therapy for chronic pain: a systematic review for a [employer] of [employer] Pathways to [employer]. Ann Intern Med. 2015 Feb 17;162(4):276-86. doi: 10.7326/M14-2559. PMID: 01601093. Caryl Bis [facility], Makuc DM. NCHS Data Brief No. 22. [place]: Centers for Disease Control and Prevention; 2009. Sep, Increase in Fatal Poisonings Involving Opioid Analgesics in the [place], 1999-2006. Song IA, Choi HR, Oh TK. Long-term opioid use and mortality in patients with chronic non-cancer pain: Ten-year follow-up study in [place] from 2010 through 2019. EClinicalMedicine. 2022 Jul 18;51:101558. doi: 10.1016/j.eclinm.2022.235573. PMID: 22025427; PMCID: [id number]. Huser, W., Schubert, T., Vogelmann, T. et al. All-cause mortality in patients with long-term opioid therapy compared with non-opioid analgesics for chronic non-cancer pain: a database study. BMC Med 18, 162 (2020). http://lester.info/ Rashidian H, Karie Kirks, Malekzadeh R, Haghdoost AA. An Ecological Study of the Association between Opiate Use and Incidence of Cancers. Addict Health. 2016 Fall;8(4):252-260. PMID: 51761607; PMCID: [id number].  Our Goal: Our goal is to control your  pain with means other than the use of opioid pain medications.  Our Recommendation: Talk to your physician about coming off of these medications. We can assist you with the tapering down and stopping these medicines. Based on the new information, even if you cannot completely stop the medication, a decrease in the dose may be associated with a lesser risk. Ask for other means of controlling the pain. Decrease or eliminate those factors that significantly contribute to your pain such as smoking, obesity, and a diet heavily tilted towards "inflammatory" nutrients.  Last Updated: 05/09/2023   ____________________________________________________________________________________________     ____________________________________________________________________________________________  National Pain Medication Shortage  The [place] is experiencing worsening drug shortages. These have had a negative widespread effect on patient care and treatment. Not expected to improve any time soon. Predicted to last past 2029.   Drug shortage list (generic names) Oxycodone IR Oxycodone/APAP Oxymorphone IR Hydromorphone Hydrocodone/APAP Morphine  Where is the problem?  Manufacturing and supply level.  Will this shortage affect you?  Only if you  take any of the above pain medications.  How? You may be unable to fill your prescription.  Your pharmacist may offer a "partial fill" of your prescription. (Warning: Do not accept partial fills.) Prescriptions partially filled cannot be transferred to another pharmacy. Read our Medication Rules and Regulation. Depending on how much medicine you are dependent on, you may experience withdrawals when unable to get the medication.  Recommendations: Consider ending your dependence on opioid pain medications. Ask your pain specialist to assist you with the process. Consider switching to a medication currently not in shortage, such as Buprenorphine. Talk to your pain  specialist about this option. Consider decreasing your pain medication requirements by managing tolerance thru "Drug Holidays". This may help minimize withdrawals, should you run out of medicine. Control your pain thru the use of non-pharmacological interventional therapies.   Your prescriber: Prescribers cannot be blamed for shortages. Medication manufacturing and supply issues cannot be fixed by the prescriber.   NOTE: The prescriber is not responsible for supplying the medication, or solving supply issues. Work with your pharmacist to solve it. The patient is responsible for the decision to take or continue taking the medication and for identifying and securing a legal supply source. By law, supplying the medication is the job and responsibility of the pharmacy. The prescriber is responsible for the evaluation, monitoring, and prescribing of these medications.   Prescribers will NOT: Re-issue prescriptions that have been partially filled. Re-issue prescriptions already sent to a pharmacy.  Re-send prescriptions to a different pharmacy because yours did not have your medication. Ask pharmacist to order more medicine or transfer the prescription to another pharmacy. (Read below.)  New 2023 regulation: "July 02, 2022 Revised Regulation Allows DEA-Registered Pharmacies to Transfer Electronic Prescriptions at a Patient's Request DEA Headquarters Division - Public Information Office Patients now have the ability to request their electronic prescription be transferred to another pharmacy without having to go back to their practitioner to initiate the request. This revised regulation went into effect on Monday, June 28, 2022.     At a patient's request, a DEA-registered retail pharmacy can now transfer an electronic prescription for a controlled substance (schedules II-V) to another DEA-registered retail pharmacy. Prior to this change, patients would have to go through their practitioner to  cancel their prescription and have it re-issued to a different pharmacy. The process was taxing and time consuming for both patients and practitioners.    The Drug Enforcement Administration [facility]) published its intent to revise the process for transferring electronic prescriptions on September 19, 2020.  The final rule was published in the federal register on May 27, 2022 and went into effect 30 days later.  Under the final rule, a prescription can only be transferred once between pharmacies, and only if allowed under existing state or other applicable law. The prescription must remain in its electronic form; may not be altered in any way; and the transfer must be communicated directly between two licensed pharmacists. It's important to note, any authorized refills transfer with the original prescription, which means the entire prescription will be filled at the same pharmacy".  Reference: [email or web address].is [facility] website announcement)  [email or web address] [employer] of Justice)   [employer] / Vol. 88, No. 143 / Thursday, May 27, 2022 / Rules and Regulations DEPARTMENT OF JUSTICE  Drug Enforcement Administration  21 CFR Part 1306  [Docket No. [id number]]  RIN [id number] Transfer of Electronic Prescriptions for Schedules II-V Controlled Substances Between Pharmacies for Initial Filling  ____________________________________________________________________________________________  ____________________________________________________________________________________________  Transfer of Pain Medication between Pharmacies  Re: 2023 DEA Clarification on existing regulation  Published on DEA Website: July 02, 2022  Title: Revised Regulation Allows DEA-Registered Pharmacies to Electrical engineer Prescriptions at a Patient's  Request DEA Headquarters Division - [employer]  "Patients now have the ability to request their electronic prescription be transferred to another pharmacy without having to go back to their practitioner to initiate the request. This revised regulation went into effect on Monday, June 28, 2022.     At a patient's request, a DEA-registered retail pharmacy can now transfer an electronic prescription for a controlled substance (schedules II-V) to another DEA-registered retail pharmacy. Prior to this change, patients would have to go through their practitioner to cancel their prescription and have it re-issued to a different pharmacy. The process was taxing and time consuming for both patients and practitioners.    The Drug Enforcement Administration [facility]) published its intent to revise the process for transferring electronic prescriptions on September 19, 2020.  The final rule was published in the federal register on May 27, 2022 and went into effect 30 days later.  Under the final rule, a prescription can only be transferred once between pharmacies, and only if allowed under existing state or other applicable law. The prescription must remain in its electronic form; may not be altered in any way; and the transfer must be communicated directly between two licensed pharmacists. It's important to note, any authorized refills transfer with the original prescription, which means the entire prescription will be filled at the same pharmacy."    REFERENCES: 1. DEA website announcement [email or web address]  2. Department of Justice website  [email or web address]  3. DEPARTMENT OF JUSTICE Drug Enforcement Administration 21 CFR Part 1306 [Docket No. [id number]] RIN [id number] "Transfer of Electronic Prescriptions for Schedules II-V Controlled Substances  Between Pharmacies for Initial Filling"  ____________________________________________________________________________________________     _______________________________________________________________________  Medication Rules  Purpose: To inform patients, and their family members, of our medication rules and regulations.  Applies to: All patients receiving prescriptions from our practice (written or electronic).  Pharmacy of record: This is the pharmacy where your electronic prescriptions will be sent. Make sure we have the correct one.  Electronic prescriptions: In compliance with the [facility] Strengthen Opioid Misuse Prevention (STOP) Act of 2017 (Session Conni Elliot [phone]), effective November 01, 2018, all controlled substances must be electronically prescribed. Written prescriptions, faxing, or calling prescriptions to a pharmacy will no longer be done.  Prescription refills: These will be provided only during in-person appointments. No medications will be renewed without a "face-to-face" evaluation with your provider. Applies to all prescriptions.  NOTE: The following applies primarily to controlled substances (Opioid* Pain Medications).   Type of encounter (visit): For patients receiving controlled substances, face-to-face visits are required. (Not an option and not up to the patient.)  Patient's responsibilities: Pain Pills: Bring all pain pills to every appointment (except for procedure appointments). Pill Bottles: Bring pills in original pharmacy bottle. Bring bottle, even if empty. Always bring the bottle of the most recent fill.  Medication refills: You are responsible for knowing and keeping track of what medications you are taking and when is it that you will need a refill. The day before your appointment: write a list of all prescriptions that need to be refilled. The day of the appointment: give the list to the admitting nurse. Prescriptions will be written only  during appointments. No prescriptions will be written on procedure days. If you forget a  medication: it will not be "Called in", "Faxed", or "electronically sent". You will need to get another appointment to get these prescribed. No early refills. Do not call asking to have your prescription filled early. Partial  or short prescriptions: Occasionally your pharmacy may not have enough pills to fill your prescription.  NEVER ACCEPT a partial fill or a prescription that is short of the total amount of pills that you were prescribed.  With controlled substances the law allows 72 hours for the pharmacy to complete the prescription.  If the prescription is not completed within 72 hours, the pharmacist will require a new prescription to be written. This means that you will be short on your medicine and we WILL NOT send another prescription to complete your original prescription.  Instead, request the pharmacy to send a carrier to a nearby branch to get enough medication to provide you with your full prescription. Prescription Accuracy: You are responsible for carefully inspecting your prescriptions before leaving our office. Have the discharge nurse carefully go over each prescription with you, before taking them home. Make sure that your name is accurately spelled, that your address is correct. Check the name and dose of your medication to make sure it is accurate. Check the number of pills, and the written instructions to make sure they are clear and accurate. Make sure that you are given enough medication to last until your next medication refill appointment. Taking Medication: Take medication as prescribed. When it comes to controlled substances, taking less pills or less frequently than prescribed is permitted and encouraged. Never take more pills than instructed. Never take the medication more frequently than prescribed.  Inform other Doctors: Always inform, all of your healthcare providers, of all the  medications you take. Pain Medication from other Providers: You are not allowed to accept any additional pain medication from any other Doctor or Healthcare provider. There are two exceptions to this rule. (see below) In the event that you require additional pain medication, you are responsible for notifying us, as stated below. Cough Medicine: Often these contain an opioid, such as codeine or hydrocodone. Never accept or take cough medicine containing these opioids if you are already taking an opioid* medication. The combination may cause respiratory failure and death. Medication Agreement: You are responsible for carefully reading and following our Medication Agreement. This must be signed before receiving any prescriptions from our practice. Safely store a copy of your signed Agreement. Violations to the Agreement will result in no further prescriptions. (Additional copies of our Medication Agreement are available upon request.) Laws, Rules, & Regulations: All patients are expected to follow all [address] and [employer], [employer], Rules, [place] [employer]. Ignorance of the Laws does not constitute a valid excuse.  Illegal drugs and Controlled Substances: The use of illegal substances (including, but not limited to marijuana and its derivatives) and/or the illegal use of any controlled substances is strictly prohibited. Violation of this rule may result in the immediate and permanent discontinuation of any and all prescriptions being written by our practice. The use of any illegal substances is prohibited. Adopted CDC guidelines & recommendations: Target dosing levels will be at or below 60 MME/day. Use of benzodiazepines** is not recommended.  Exceptions: There are only two exceptions to the rule of not receiving pain medications from other Healthcare Providers. Exception #1 (Emergencies): In the event of an emergency (i.e.: accident requiring emergency care), you are allowed to receive additional pain  medication. However, you are responsible for: As soon as  you are able, call our office [phone], at any time of the day or night, and leave a message stating your name, the date and nature of the emergency, and the name and dose of the medication prescribed. In the event that your call is answered by a member of our staff, make sure to document and save the date, time, and the name of the person that took your information.  Exception #2 (Planned Surgery): In the event that you are scheduled by another doctor or dentist to have any type of surgery or procedure, you are allowed (for a period no longer than 30 days), to receive additional pain medication, for the acute post-op pain. However, in this case, you are responsible for picking up a copy of our "Post-op Pain Management for Surgeons" handout, and giving it to your surgeon or dentist. This document is available at our office, and does not require an appointment to obtain it. Simply go to our office during business hours (Monday-Thursday from 8:00 AM to 4:00 PM) (Friday 8:00 AM to 12:00 Noon) or if you have a scheduled appointment with [place], prior to your surgery, and ask for it by name. In addition, you are responsible for: calling our office [phone], at any time of the day or night, and leaving a message stating your name, name of your surgeon, type of surgery, and date of procedure or surgery. Failure to comply with your responsibilities may result in termination of therapy involving the controlled substances. Medication Agreement Violation. Following the above rules, including your responsibilities will help you in avoiding a Medication Agreement Violation ("Breaking your Pain Medication Contract").  Consequences:  Not following the above rules may result in permanent discontinuation of medication prescription therapy.  *Opioid medications include: morphine, codeine, oxycodone, oxymorphone, hydrocodone, hydromorphone, meperidine, tramadol,  tapentadol, buprenorphine, fentanyl, methadone. **Benzodiazepine medications include: diazepam (Valium), alprazolam (Xanax), clonazepam (Klonopine), lorazepam (Ativan), clorazepate (Tranxene), chlordiazepoxide (Librium), estazolam (Prosom), oxazepam (Serax), temazepam (Restoril), triazolam (Halcion) (Last updated: 08/24/2022) ______________________________________________________________________    ______________________________________________________________________  Medication Recommendations and Reminders  Applies to: All patients receiving prescriptions (written and/or electronic).  Medication Rules & Regulations: You are responsible for reading, knowing, and following our "Medication Rules" document. These exist for your safety and that of others. They are not flexible and neither are we. Dismissing or ignoring them is an act of "non-compliance" that may result in complete and irreversible termination of such medication therapy. For safety reasons, "non-compliance" will not be tolerated. As with the [place] fundamental legal principle of "ignorance of the law is no defense", we will accept no excuses for not having read and knowing the content of documents provided to you by our practice.  Pharmacy of record:  Definition: This is the pharmacy where your electronic prescriptions will be sent.  We do not endorse any particular pharmacy. It is up to you and your insurance to decide what pharmacy to use.  We do not restrict you in your choice of pharmacy. However, once we write for your prescriptions, we will NOT be re-sending more prescriptions to fix restricted supply problems created by your pharmacy, or your insurance.  The pharmacy listed in the electronic medical record should be the one where you want electronic prescriptions to be sent. If you choose to change pharmacy, simply notify our nursing staff. Changes will be made only during your regular appointments and not over the  phone.  Recommendations: Keep all of your pain medications in a safe place, under lock and key, even  if you live alone. We will NOT replace lost, stolen, or damaged medication. We do not accept "Police Reports" as proof of medications having been stolen. After you fill your prescription, take 1 week's worth of pills and put them away in a safe place. You should keep a separate, properly labeled bottle for this purpose. The remainder should be kept in the original bottle. Use this as your primary supply, until it runs out. Once it's gone, then you know that you have 1 week's worth of medicine, and it is time to come in for a prescription refill. If you do this correctly, it is unlikely that you will ever run out of medicine. To make sure that the above recommendation works, it is very important that you make sure your medication refill appointments are scheduled at least 1 week before you run out of medicine. To do this in an effective manner, make sure that you do not leave the office without scheduling your next medication management appointment. Always ask the nursing staff to show you in your prescription , when your medication will be running out. Then arrange for the receptionist to get you a return appointment, at least 7 days before you run out of medicine. Do not wait until you have 1 or 2 pills left, to come in. This is very poor planning and does not take into consideration that we may need to cancel appointments due to bad weather, sickness, or emergencies affecting our staff. DO NOT ACCEPT A "Partial Fill": If for any reason your pharmacy does not have enough pills/tablets to completely fill or refill your prescription, do not allow for a "partial fill". The law allows the pharmacy to complete that prescription within 72 hours, without requiring a new prescription. If they do not fill the rest of your prescription within those 72 hours, you will need a separate prescription to fill the remaining  amount, which we will NOT provide. If the reason for the partial fill is your insurance, you will need to talk to the pharmacist about payment alternatives for the remaining tablets, but again, DO NOT ACCEPT A PARTIAL FILL, unless you can trust your pharmacist to obtain the remainder of the pills within 72 hours.  Prescription refills and/or changes in medication(s):  Prescription refills, and/or changes in dose or medication, will be conducted only during scheduled medication management appointments. (Applies to both, written and electronic prescriptions.) No refills on procedure days. No medication will be changed or started on procedure days. No changes, adjustments, and/or refills will be conducted on a procedure day. Doing so will interfere with the diagnostic portion of the procedure. No phone refills. No medications will be "called into the pharmacy". No Fax refills. No weekend refills. No Holliday refills. No after hours refills.  Remember:  Business hours are:  Monday to Thursday 8:00 AM to 4:00 PM Provider's Schedule: Delano Metz, MD - Appointments are:  Medication management: Monday and Wednesday 8:00 AM to 4:00 PM Procedure day: Tuesday and Thursday 7:30 AM to 4:00 PM Edward Jolly, MD - Appointments are:  Medication management: Tuesday and Thursday 8:00 AM to 4:00 PM Procedure day: Monday and Wednesday 7:30 AM to 4:00 PM (Last update: 08/24/2022) ______________________________________________________________________   ____________________________________________________________________________________________  Naloxone Nasal Spray  Why am I receiving this medication? [place] STOP ACT requires that all patients taking high dose opioids or at risk of opioids respiratory depression, be prescribed an opioid reversal agent, such as Naloxone (AKA: Narcan).  What is this medication? NALOXONE (  nal OX one) treats opioid overdose, which causes slow or shallow breathing,  severe drowsiness, or trouble staying awake. Call emergency services after using this medication. You may need additional treatment. Naloxone works by reversing the effects of opioids. It belongs to a group of medications called opioid blockers.  COMMON BRAND NAME(S): Kloxxado, Narcan  What should I tell my care team before I take this medication? They need to know if you have any of these conditions: Heart disease Substance use disorder An unusual or allergic reaction to naloxone, other medications, foods, dyes, or preservatives Pregnant or trying to get pregnant Breast-feeding  When to use this medication? This medication is to be used for the treatment of respiratory depression (less than 8 breaths per minute) secondary to opioid overdose.   How to use this medication? This medication is for use in the nose. Lay the person on their back. Support their neck with your hand and allow the head to tilt back before giving the medication. The nasal spray should be given into 1 nostril. After giving the medication, move the person onto their side. Do not remove or test the nasal spray until ready to use. Get emergency medical help right away after giving the first dose of this medication, even if the person wakes up. You should be familiar with how to recognize the signs and symptoms of a narcotic overdose. If more doses are needed, give the additional dose in the other nostril. Talk to your care team about the use of this medication in children. While this medication may be prescribed for children as young as newborns for selected conditions, precautions do apply.  Naloxone Overdosage: If you think you have taken too much of this medicine contact a poison control center or emergency room at once.  NOTE: This medicine is only for you. Do not share this medicine with others.  What if I miss a dose? This does not apply.  What may interact with this medication? This is only used during an  emergency. No interactions are expected during emergency use. This list may not describe all possible interactions. Give your health care provider a list of all the medicines, herbs, non-prescription drugs, or dietary supplements you use. Also tell them if you smoke, drink alcohol, or use illegal drugs. Some items may interact with your medicine.  What should I watch for while using this medication? Keep this medication ready for use in the case of an opioid overdose. Make sure that you have the phone number of your care team and local hospital ready. You may need to have additional doses of this medication. Each nasal spray contains a single dose. Some emergencies may require additional doses. After use, bring the treated person to the nearest hospital or call 911. Make sure the treating care team knows that the person has received a dose of this medication. You will receive additional instructions on what to do during and after use of this medication before an emergency occurs.  What side effects may I notice from receiving this medication? Side effects that you should report to your care team as soon as possible: Allergic reactions--skin rash, itching, hives, swelling of the face, lips, tongue, or throat Side effects that usually do not require medical attention (report these to your care team if they continue or are bothersome): Constipation Dryness or irritation inside the nose Headache Increase in blood pressure Muscle spasms Stuffy nose Toothache This list may not describe all possible side effects. Call your doctor for  medical advice about side effects. You may report side effects to FDA at 1-800-FDA-1088.  Where should I keep my medication? Because this is an emergency medication, you should keep it with you at all times.  Keep out of the reach of children and pets. Store between 20 and 25 degrees C (68 and 77 degrees F). Do not freeze. Throw away any unused medication after the  expiration date. Keep in original box until ready to use.  NOTE: This sheet is a summary. It may not cover all possible information. If you have questions about this medicine, talk to your doctor, pharmacist, or health care provider.   2023 Elsevier/Gold Standard (2021-06-26 00:00:00)  ____________________________________________________________________________________________

## 2023-08-01 ENCOUNTER — Encounter: Payer: No Typology Code available for payment source | Admitting: Pain Medicine

## 2023-08-29 ENCOUNTER — Other Ambulatory Visit: Payer: Self-pay | Admitting: Physician Assistant

## 2023-08-29 ENCOUNTER — Other Ambulatory Visit: Payer: Self-pay | Admitting: Nurse Practitioner

## 2023-08-30 NOTE — Telephone Encounter (Signed)
Requested medication (s) are due for refill today:   Provider to review  Requested medication (s) are on the active medication list:   Yes  Future visit scheduled:   Yes 11/19 with Clydie Braun   Last ordered: 06/08/2023 #180, 0 refills  Non delegated refill    Requested Prescriptions  Pending Prescriptions Disp Refills   ondansetron (ZOFRAN) 8 MG tablet [Pharmacy Med Name: ONDANSETRON HCL 8 MG TABLET] 180 tablet 0    Sig: TAKE 1 TABLET (8 MG TOTAL) BY MOUTH 2 (TWO) TIMES DAILY.     Not Delegated - Gastroenterology: Antiemetics - ondansetron Failed - 08/29/2023  1:18 PM      Failed - This refill cannot be delegated      Passed - AST in normal range and within 360 days    AST  Date Value Ref Range Status  02/22/2023 37 0 - 40 IU/L Final         Passed - ALT in normal range and within 360 days    ALT  Date Value Ref Range Status  02/22/2023 37 0 - 44 IU/L Final         Passed - Valid encounter within last 6 months    Recent Outpatient Visits           4 months ago Cervical DDD (C4-5, C5-6, C6-7 and C7-T1)   [place] [facility] [place], Megan P, DO   6 months ago Annual physical exam   [place] [facility] Larae Grooms, NP   1 year ago Aortic atherosclerosis [facility])   [place] [facility] Larae Grooms, NP   1 year ago Annual physical exam   [place] [facility] Larae Grooms, NP   1 year ago Upper respiratory tract infection, unspecified type   [place] [facility] Gerre Scull, NP       Future Appointments             In 3 weeks Larae Grooms, NP [place] [facility]

## 2023-08-30 NOTE — Telephone Encounter (Signed)
Requested Prescriptions  Pending Prescriptions Disp Refills   fenofibrate 160 MG tablet [Pharmacy Med Name: FENOFIBRATE 160 MG TABLET] 90 tablet 1    Sig: TAKE 1 TABLET BY MOUTH EVERY DAY     Cardiovascular:  Antilipid - Fibric Acid Derivatives Failed - 08/29/2023  1:18 PM      Failed - Lipid Panel in normal range within the last 12 months    Cholesterol, Total  Date Value Ref Range Status  02/22/2023 166 100 - 199 mg/dL Final   Cholesterol Piccolo, Waived  Date Value Ref Range Status  07/30/2015 169 <200 mg/dL Final    Comment:                            Desirable                <200                         Borderline High      200- 239                         High                     >239    LDL Chol Calc (NIH)  Date Value Ref Range Status  02/22/2023 105 (H) 0 - 99 mg/dL Final   HDL  Date Value Ref Range Status  02/22/2023 42 >39 mg/dL Final   Triglycerides  Date Value Ref Range Status  02/22/2023 105 0 - 149 mg/dL Final   Triglycerides Piccolo,Waived  Date Value Ref Range Status  07/30/2015 288 (H) <150 mg/dL Final    Comment:                            Normal                   <150                         Borderline High     150 - 199                         High                200 - 499                         Very High                >499          Passed - ALT in normal range and within 360 days    ALT  Date Value Ref Range Status  02/22/2023 37 0 - 44 IU/L Final         Passed - AST in normal range and within 360 days    AST  Date Value Ref Range Status  02/22/2023 37 0 - 40 IU/L Final         Passed - Cr in normal range and within 360 days    Creatinine, Ser  Date Value Ref Range Status  02/22/2023 1.07 0.76 - 1.27 mg/dL Final         Passed - HGB in normal range and within 360 days  Hemoglobin  Date Value Ref Range Status  02/22/2023 14.6 13.0 - 17.7 g/dL Final         Passed - HCT in normal range and within 360 days    Hematocrit   Date Value Ref Range Status  02/22/2023 44.2 37.5 - 51.0 % Final         Passed - PLT in normal range and within 360 days    Platelets  Date Value Ref Range Status  02/22/2023 346 150 - 450 x10E3/uL Final         Passed - WBC in normal range and within 360 days    WBC  Date Value Ref Range Status  02/22/2023 7.6 3.4 - 10.8 x10E3/uL Final  02/08/2021 8.9 4.0 - 10.5 K/uL Final         Passed - eGFR is 30 or above and within 360 days    GFR calc Af Amer  Date Value Ref Range Status  12/24/2020 84 >59 mL/min/1.73 Final    Comment:    **In accordance with recommendations from the NKF-ASN Task force,**   [facility] is in the process of updating its eGFR calculation to the   2021 CKD-EPI creatinine equation that estimates kidney function   without a race variable.    GFR, Estimated  Date Value Ref Range Status  02/08/2021 >60 >60 mL/min Final    Comment:    (NOTE) Calculated using the CKD-EPI Creatinine Equation (2021)    eGFR  Date Value Ref Range Status  02/22/2023 81 >59 mL/min/1.73 Final         Passed - Valid encounter within last 12 months    Recent Outpatient Visits           4 months ago Cervical DDD (C4-5, C5-6, C6-7 and C7-T1)   [place] [facility] [place], Megan P, DO   6 months ago Annual physical exam   [place] [facility] Larae Grooms, NP   1 year ago Aortic atherosclerosis [facility])   [place] [facility] Larae Grooms, NP   1 year ago Annual physical exam   [place] [facility] Larae Grooms, NP   1 year ago Upper respiratory tract infection, unspecified type   [place] [facility] Gerre Scull, NP       Future Appointments             In 3 weeks Larae Grooms, NP  [facility], [facility]

## 2023-09-18 DIAGNOSIS — M542 Cervicalgia: Secondary | ICD-10-CM | POA: Insufficient documentation

## 2023-09-18 DIAGNOSIS — M549 Dorsalgia, unspecified: Secondary | ICD-10-CM | POA: Insufficient documentation

## 2023-09-18 NOTE — Progress Notes (Unsigned)
PROVIDER NOTE: Information contained herein reflects review and annotations entered in association with encounter. Interpretation of such information and data should be left to medically-trained personnel. Information provided to patient can be located elsewhere in the medical record under "Patient Instructions". Document created using STT-dictation technology, any transcriptional errors that may result from process are unintentional.    Patient: Jose Washington  Service Category: E/M  Provider: Oswaldo Done, MD  DOB: [birth year]  DOS: 09/19/2023  Referring Provider: Larae Grooms, NP  MRN: [id number]  Specialty: Interventional Pain Management  PCP: Larae Grooms, NP  Type: Established Patient  Setting: Ambulatory outpatient    Location: Office  Delivery: Face-to-face     HPI  [name], a 57 y.o. year old male, is here today because of his Chronic neck pain [M54.2, G89.29]. Mr. Coro primary complain today is No chief complaint on file.  Pertinent problems: Mr. Mccrary has Chronic neck pain (3ry area of Pain) (Bilateral); Chronic pain syndrome; Chronic low back pain (1ry area of Pain) (Bilateral) (L>R) w/o sciatica ; Chronic upper back pain (4th area of Pain) (Bilateral); Chronic shoulder pain (Bilateral) (L>R); Osteoarthritis of AC (acromioclavicular) joint (Right); Chronic sacroiliac joint pain (Bilateral) (L>R); Muscle spasticity; Cervical DDD (C4-5, C5-6, C6-7 and C7-T1); Cervical foraminal stenosis (Bilateral: C5-6 & C6-7, Left: C4-5 & C7-T1); Cervical radiculitis (Bilateral) (R>L); Cervical facet syndrome (Bilateral) (L>R); Cervical spondylosis; Musculoskeletal neck pain (trapezius); Chronic knee pain (Left); Chronic lower extremity pain (2ry area of Pain) (Left); Grade 1 Retrolisthesis of L3 over L4; Lumbar facet arthropathy (Bilateral); Lumbar facet osteoarthritis; Lumbar facet syndrome (Bilateral) (R>L); Lumbar spondylosis; Chronic shoulder pain (Right);  Suprascapular neuropathy (Right); DDD (degenerative disc disease), lumbosacral; Lumbar facet hypertrophy (Multilevel) (Bilateral); Spondylosis without myelopathy or radiculopathy, lumbosacral region; Inflammatory spondylopathy of lumbosacral region [facility]); Chronic lower extremity pain (Right); Chronic musculoskeletal pain; Abnormal MRI, lumbar spine (09/07/2017); Cervicalgia (Bilateral); Spondylosis without myelopathy or radiculopathy, cervical region; Myofascial pain syndrome of thoracic spine (serratus muscle) (Right); Chronic flank pain (Right); Abnormal result on screening urine test (03/27/20 & 08/20/20); Chronic hip pain (Bilateral); Chronic hip pain (Right); Enthesopathy of hip region (Right); Osteoarthritis of hip (Right); Trigger point of shoulder region (Right); Pain of cervical facet joint; Trigger point with neck pain; and Trigger point with back pain on their pertinent problem list. Pain Assessment: Severity of   is reported as a  /10. Location:    / . Onset:  . Quality:  . Timing:  . Modifying factor(s):  Marland Kitchen Vitals:  vitals were not taken for this visit.  BMI: Estimated body mass index is 27.88 kg/m as calculated from the following:   Height as of 06/07/23: 5\' 7"  (1.702 m).   Weight as of 06/07/23: 178 lb (80.7 kg). Last encounter: 07/27/2023. Last procedure: 04/12/2023.  Reason for encounter: evaluation of worsening, or previously known (established) problem.  The patient has requested this appointment to evaluate and treat pain in the middle of his back and neck region which he believes to be myofascial in origin (trigger point).  PMP: The patient's last prescription for hydrocodone/APAP (# 30) was filled on 06/30/2023 (2/3 written 05/02/2023).  According to my calculations the patient should still have 1 more prescription (3/3) written on 05/02/2023 which has not been filled.  Pharmacotherapy Assessment  Analgesic: Hydrocodone/APAP 5/325, 1 tab PO QD (5 mg/day of hydrocodone) MME/day: 5  mg/day.   Monitoring: [place] PMP: PDMP reviewed during this encounter.       Pharmacotherapy: No side-effects or adverse reactions reported.  Compliance: No problems identified. Effectiveness: Clinically acceptable.  No notes on file  No results found for: "CBDTHCR" No results found for: "D8THCCBX" No results found for: "D9THCCBX"  UDS:  Summary  Date Value Ref Range Status  05/02/2023 Note  Final    Comment:    ==================================================================== [employer] (MW) ==================================================================== Test                             Result       Flag       Units  Drug Present and Declared for Prescription Verification   Norhydrocodone                 34           EXPECTED   ng/mg creat    Norhydrocodone is an expected metabolite of hydrocodone.  Drug Present not Declared for Prescription Verification   Oxycodone                      297          UNEXPECTED ng/mg creat   Oxymorphone                    90           UNEXPECTED ng/mg creat   Noroxycodone                   433          UNEXPECTED ng/mg creat   Noroxymorphone                 20           UNEXPECTED ng/mg creat    Sources of oxycodone are scheduled prescription medications.    Oxymorphone, noroxycodone, and noroxymorphone are expected    metabolites of oxycodone. Oxymorphone is also available as a    scheduled prescription medication.  Drug Absent but Declared for Prescription Verification   Hydrocodone                    Not Detected UNEXPECTED ng/mg creat    Hydrocodone is almost always present in patients taking this drug    consistently. Absence of hydrocodone could be due to lapse of time    since the last dose or unusual pharmacokinetics (rapid metabolism).  ==================================================================== Test                      Result    Flag   Units      Ref Range   Creatinine              320              mg/dL       >=09 ==================================================================== Declared Medications:  The flagging and interpretation on this report are based on the  following declared medications.  Unexpected results may arise from  inaccuracies in the declared medications.   **Note: The testing scope of this panel includes these medications:   Hydrocodone (Norco)   **Note: The testing scope of this panel does not include the  following reported medications:   Acetaminophen (Tylenol)  Acetaminophen (Norco)  Atenolol (Tenormin)  Atorvastatin (Lipitor)  Baclofen (Lioresal)  Buspirone (Buspar)  Calcium  Chondroitin  Desloratadine (Clarinex-D)  Diphenhydramine  Docusate (Colace)  Duloxetine (Cymbalta)  Fenofibrate  Fluticasone (Flonase)  Glucosamine  Hydroxyzine (Atarax)  Ibuprofen (Advil)  Levalbuterol (Xopenex)  Magnesium (Mag-Ox)  Melatonin  Methylsulfonylmethane  Multivitamin  Naloxone (Narcan)  Omeprazole (Prilosec)  Ondansetron (Zofran)  Potassium  Pseudoephedrine (Clarinex-D)  Simethicone  Sucralfate (Carafate)  Supplement (Prevagen)  Vitamin B12  Vitamin D3  Vitamin E ==================================================================== For clinical consultation, please call [phone]. ====================================================================       ROS  Constitutional: Denies any fever or chills Gastrointestinal: No reported hemesis, hematochezia, vomiting, or acute GI distress Musculoskeletal: Denies any acute onset joint swelling, redness, loss of ROM, or weakness Neurological: No reported episodes of acute onset apraxia, aphasia, dysarthria, agnosia, amnesia, paralysis, loss of coordination, or loss of consciousness  Medication Review  Apoaequorin, Calcium Carbonate, DULoxetine, Elderberry, Glucosamine-Chondroit-Vit C-Mn, HYDROcodone-acetaminophen, Methylsulfonylmethane, Potassium, Vitamin D3, acetaminophen, atenolol, atorvastatin,  baclofen, busPIRone, cyanocobalamin, desloratadine-pseudoephedrine, diphenhydrAMINE HCl, docusate sodium, fenofibrate, fluticasone, hydrOXYzine, ibuprofen, levalbuterol, magnesium oxide, melatonin, multivitamin, naloxone, omeprazole, ondansetron, simethicone, sucralfate, and vitamin E  History Review  Allergy: Mr. Gale has No Known Allergies. Drug: Mr. Data  reports no history of drug use. Alcohol:  reports no history of alcohol use. Tobacco:  reports that he has never smoked. He has never used smokeless tobacco. Social: Mr. Ord  reports that he has never smoked. He has never used smokeless tobacco. He reports that he does not drink alcohol and does not use drugs. Medical:  has a past medical history of Allergy, Anxiety, Chronic duodenal ulcer with hemorrhage (2012), Chronic neck pain, Depression, GERD (gastroesophageal reflux disease), Hyperlipidemia, Hypertension, and Microscopic hematuria. Surgical: Mr. Laduca  has a past surgical history that includes eye muscle repair (1972 and 1975); bLEEDING ULCER (2012); Flexible sigmoidoscopy; Colonoscopy with propofol (N/A, 03/24/2021); and Esophagogastroduodenoscopy (egd) with propofol (N/A, 03/24/2021). Family: family history includes Allergies in his brother; Cancer in his maternal grandfather and mother; Dementia in his father and paternal grandfather; Depression in his father; Diabetes in his mother; Mental illness in his father; Stroke in his paternal grandfather.  Laboratory Chemistry Profile   Renal Lab Results  Component Value Date   BUN 16 02/22/2023   CREATININE 1.07 02/22/2023   BCR 15 02/22/2023   GFRAA 84 12/24/2020   GFRNONAA >60 02/08/2021    Hepatic Lab Results  Component Value Date   AST 37 02/22/2023   ALT 37 02/22/2023   ALBUMIN 4.5 02/22/2023   ALKPHOS 60 02/22/2023   LIPASE 28 02/08/2021    Electrolytes Lab Results  Component Value Date   NA 140 02/22/2023   K 4.9 02/22/2023   CL 104 02/22/2023   CALCIUM  9.8 02/22/2023   MG 2.0 11/12/2016    Bone Lab Results  Component Value Date   VD25OH 58.2 02/22/2023   25OHVITD1 23 (L) 11/12/2016   25OHVITD2 <1.0 11/12/2016   25OHVITD3 23 11/12/2016    Inflammation (CRP: Acute Phase) (ESR: Chronic Phase) Lab Results  Component Value Date   CRP 1.4 (H) 11/12/2016   ESRSEDRATE 3 11/12/2016         Note: Above Lab results reviewed.  Recent Imaging Review  DG Cervical Spine With Flex & Extend CLINICAL DATA:  Cervicalgia.  Neck pain.  EXAM: CERVICAL SPINE COMPLETE WITH FLEXION AND EXTENSION VIEWS  COMPARISON:  Cervical spine radiograph 11/12/2016  FINDINGS: There is 2-3 mm grade 1 anterolisthesis of C4 on C5, unchanged on flexion and extension. 1-2 mm anterolisthesis of C3 on C4 is unchanged on flexion, 0 mm on extension. 2-3 mm anterolisthesis of C5 on C6 is unchanged on flexion, 0 mm on extension. Moderate diffuse degenerative disc disease, most prominently affecting  C6-C7 and C7-T1. C7-T1 degenerative changes have progressed from 2018. Advanced diffuse facet hypertrophy. Suggestion of diffuse bony neural foraminal narrowing, not well assessed due to positioning. The dens is not well assessed on provided views. No evidence of fracture or focal bone abnormality. No prevertebral soft tissue thickening.  IMPRESSION: 1. Moderate diffuse degenerative disc disease, most prominently affecting C6-C7 and C7-T1. 2. Advanced diffuse facet hypertrophy. 3. Suggestion of diffuse bony neural foraminal narrowing, not well assessed due to positioning. 4. Grade 1 anterolisthesis of C4 on C5 is unchanged on flexion and extension. Grade 1 anterolisthesis of C3 on C4 and C5 on C6 is unchanged on flexion, 0 mm on extension.  Electronically Signed   By: Narda Rutherford M.D.   On: 04/17/2023 14:28 Note: Reviewed        Physical Exam  General appearance: Well nourished, well developed, and well hydrated. In no apparent acute distress Mental  status: Alert, oriented x 3 (person, place, & time)       Respiratory: No evidence of acute respiratory distress Eyes: PERLA Vitals: There were no vitals taken for this visit. BMI: Estimated body mass index is 27.88 kg/m as calculated from the following:   Height as of 06/07/23: 5\' 7"  (1.702 m).   Weight as of 06/07/23: 178 lb (80.7 kg). Ideal: Patient weight not recorded  Assessment   Diagnosis Status  1. Chronic neck pain (3ry area of Pain) (Bilateral)   2. Chronic low back pain (1ry area of Pain) (Bilateral) (L>R) w/o sciatica    3. Chronic musculoskeletal pain   4. Trigger point of shoulder region (Right)   5. Trigger point with neck pain   6. Trigger point with back pain    Controlled Controlled Controlled   Updated Problems: Problem  Trigger Point With Neck Pain  Trigger Point With Back Pain    Plan of Care  Problem-specific:  No problem-specific Assessment & Plan notes found for this encounter.  Mr. ASAH RAMSIER has a current medication list which includes the following long-term medication(s): atenolol, atorvastatin, baclofen, calcium carbonate, diphenhydramine hcl, duloxetine, fenofibrate, hydrocodone-acetaminophen, levalbuterol, omeprazole, potassium, simethicone, and sucralfate.  Pharmacotherapy (Medications Ordered): No orders of the defined types were placed in this encounter.  Orders:  No orders of the defined types were placed in this encounter.  Follow-up plan:   No follow-ups on file.      Interventional Therapies  Risk Factors  Considerations:   Abnormal UDS (03/27/2020) & (08/20/20) (+) undisclosed carboxy-THC    Planned  Pending:      Under consideration:   Therapeutic right IA hip joint injection #2  Therapeutic right lumbar facet RFA #2    Completed:   Diagnostic/therapeutic right IA hip inj. x1 (12/09/2020) (100/100/95/90-95)  Therapeutic right lumbar facet RFA x2 (11/26/2021) (100/100/75/75)  Palliative right lumbar facet MBB x2  (06/07/2019) (100/100/100 x20 days/>75 x3 months)  Diagnostic/therapeutic left lumbar facet MBB x1 (06/07/2019) (100/100/100 x20 days/>75 x3 months)  Diagnostic/therapeutic left L4-5 LESI x2 (07/25/2018) (n/a)  Palliative left CESI x3 (02/16/2018) (100/100/100/100)  Diagnostic right cervical facet MBB x2 (08/19/2022) (100/100/100/100) (w/ local anesthetic only)  Diagnostic left cervical facet MBB x1 (01/10/2020) (100/100/100)  Therapeutic right cervical facet RFA x1 (04/12/2023) (100/100/90/90)  Diagnostic/therapeutic right shoulder region TPI/MNB x2 (07/19/2022) (100/100/100/100)    Therapeutic  Palliative (PRN) options:   Therapeutic lumbar facet RFA   Therapeutic/palliative lumbar facet MBB   Therapeutic L4-5 LESI   Therapeutic CESI   Therapeutic right IA hip joint injection #2  Diagnostic  cervical facet MBB #2    Pharmacotherapy  Nonopioids transferred 08/20/2020: Baclofen       Recent Visits No visits were found meeting these conditions. Showing recent visits within past 90 days and meeting all other requirements Future Appointments Date Type Provider Dept  09/19/23 Appointment Delano Metz, MD [facility]  Showing future appointments within next 90 days and meeting all other requirements  I discussed the assessment and treatment plan with the patient. The patient was provided an opportunity to ask questions and all were answered. The patient agreed with the plan and demonstrated an understanding of the instructions.  Patient advised to call back or seek an in-person evaluation if the symptoms or condition worsens.  Duration of encounter: *** minutes.  Total time on encounter, as per AMA guidelines included both the face-to-face and non-face-to-face time personally spent by the physician and/or other qualified health care professional(s) on the day of the encounter (includes time in activities that require the physician or other qualified health care professional  and does not include time in activities normally performed by clinical staff). Physician's time may include the following activities when performed: Preparing to see the patient (e.g., pre-charting review of records, searching for previously ordered imaging, lab work, and nerve conduction tests) Review of prior analgesic pharmacotherapies. Reviewing PMP Interpreting ordered tests (e.g., lab work, imaging, nerve conduction tests) Performing post-procedure evaluations, including interpretation of diagnostic procedures Obtaining and/or reviewing separately obtained history Performing a medically appropriate examination and/or evaluation Counseling and educating the patient/family/caregiver Ordering medications, tests, or procedures Referring and communicating with other health care professionals (when not separately reported) Documenting clinical information in the electronic or other health record Independently interpreting results (not separately reported) and communicating results to the patient/ family/caregiver Care coordination (not separately reported)  Note by: Oswaldo Done, MD Date: 09/19/2023; Time: 5:27 PM

## 2023-09-19 ENCOUNTER — Ambulatory Visit: Payer: No Typology Code available for payment source | Attending: Pain Medicine | Admitting: Pain Medicine

## 2023-09-19 ENCOUNTER — Encounter: Payer: Self-pay | Admitting: Pain Medicine

## 2023-09-19 VITALS — BP 148/96 | HR 81 | Temp 97.3°F | Resp 18 | Ht 67.0 in | Wt 180.0 lb

## 2023-09-19 DIAGNOSIS — M549 Dorsalgia, unspecified: Secondary | ICD-10-CM | POA: Diagnosis present

## 2023-09-19 DIAGNOSIS — M7918 Myalgia, other site: Secondary | ICD-10-CM | POA: Diagnosis not present

## 2023-09-19 DIAGNOSIS — M25511 Pain in right shoulder: Secondary | ICD-10-CM

## 2023-09-19 DIAGNOSIS — M545 Low back pain, unspecified: Secondary | ICD-10-CM | POA: Diagnosis present

## 2023-09-19 DIAGNOSIS — M542 Cervicalgia: Secondary | ICD-10-CM

## 2023-09-19 DIAGNOSIS — G8929 Other chronic pain: Secondary | ICD-10-CM

## 2023-09-19 MED ORDER — ROPIVACAINE HCL 2 MG/ML IJ SOLN
9.0000 mL | Freq: Once | INTRAMUSCULAR | Status: AC
  Administered 2023-09-19: 9 mL
  Filled 2023-09-19: qty 20

## 2023-09-19 MED ORDER — PENTAFLUOROPROP-TETRAFLUOROETH EX AERO: INHALATION_SPRAY | Freq: Once | CUTANEOUS | Status: DC

## 2023-09-19 MED ORDER — METHYLPREDNISOLONE ACETATE 80 MG/ML IJ SUSP
80.0000 mg | Freq: Once | INTRAMUSCULAR | Status: AC
  Administered 2023-09-19: 80 mg via INTRA_ARTICULAR
  Filled 2023-09-19: qty 1

## 2023-09-19 MED ORDER — LIDOCAINE HCL 2 % IJ SOLN
20.0000 mL | Freq: Once | INTRAMUSCULAR | Status: AC
  Administered 2023-09-19: 400 mg
  Filled 2023-09-19: qty 20

## 2023-09-19 NOTE — Progress Notes (Signed)
Safety precautions to be maintained throughout the outpatient stay will include: orient to surroundings, keep bed in low position, maintain call bell within reach at all times, provide assistance with transfer out of bed and ambulation.  

## 2023-09-19 NOTE — Patient Instructions (Signed)

## 2023-09-19 NOTE — Progress Notes (Signed)
PROVIDER NOTE: Interpretation of information contained herein should be left to medically-trained personnel. Specific patient instructions are provided elsewhere under "Patient Instructions" section of medical record. This document was created in part using STT-dictation technology, any transcriptional errors that may result from this process are unintentional.  Patient: Jose Washington Type: Established DOB: [birth year] MRN: [id number] PCP: Larae Grooms, NP  Service: Procedure DOS: 09/19/2023 Setting: Ambulatory Location: Ambulatory outpatient facility Delivery: Face-to-face Provider: Oswaldo Done, MD Specialty: Interventional Pain Management Specialty designation: 09 Location: Outpatient facility Ref. Prov.: Larae Grooms, NP       Interventional Therapy   Type:  Right trapezius, left erector spinae muscle, (Right) longissimus thoracis muscle Trigger Point Injection (Myoneural Block) (1-2 muscle groups)        (w/ steroids)  CPT: 20552 Laterality: Bilateral    Imaging: N/A. Landmark-guided",           Anesthesia: Local anesthesia (1-2% Lidocaine) Anxiolysis: None                 Sedation: No Sedation                       DOS: 09/19/2023  Performed by: Oswaldo Done, MD  Medical Necessity (reasoning)  Purpose: Diagnostic/Therapeutic Rationale (medical necessity): procedure needed and proper for the diagnosis and/or treatment of Jose Washington medical symptoms and needs. Indications: Right trapezius, left erector spinae muscle, (Right) longissimus thoracis muscle pain severe enough to impact quality of life and/or function. 1. Chronic neck pain (3ry area of Pain) (Bilateral)   2. Chronic low back pain (1ry area of Pain) (Bilateral) (L>R) w/o sciatica    3. Chronic musculoskeletal pain   4. Trigger point of shoulder region (Right)   5. Trigger point with neck pain   6. Trigger point with back pain    NAS-11 Pain score:   Pre-procedure: 3 /10    Post-procedure: 1 /10       Muscle: Right trapezius, left erector spinae muscle, (Right) longissimus thoracis muscle  Target: Myoneural mass of trigger point. Region:  Right upper back, right mid back, and left lower back. Anatomic Surface: Posterior Anatomic Area: Cervico-thoracic, mid thoracic, and lumbar Level:  Right superior border of the trapezius muscle, paravertebral lower border of right scapula, and left paraspinal area at about the L4 spinous process level.  Approach: Percutaneous  Type of procedure: Myoneural injection   Position  Prep  Materials  Position: Sitting. Patient assisted into a comfortable position. Pressure points checked.  Prep solution: ChloraPrep (2% chlorhexidine gluconate and 70% isopropyl alcohol) The target area was identified and the area prepped in the usual manner.  Prep Area: Entire upper, mid, and lower cervicothoracic, mid thoracic, and lumbar region  Materials:   Tray: Block Needle(s):  Type: Regular  Gauge (G): 22  Length: 1.5-in  Qty: 1  H&P (Pre-op Assessment):  Jose Washington is a 57 y.o. (year old), male patient, seen today for interventional treatment. He  has a past surgical history that includes eye muscle repair (1972 and 1975); bLEEDING ULCER (2012); Flexible sigmoidoscopy; Colonoscopy with propofol (N/A, 03/24/2021); and Esophagogastroduodenoscopy (egd) with propofol (N/A, 03/24/2021). Jose Washington has a current medication list which includes the following prescription(s): acetaminophen, prevagen extra strength, atenolol, atorvastatin, baclofen, buspirone, calcium carbonate, vitamin d3, desloratadine-pseudoephedrine, diphenhydramine hcl, docusate sodium, duloxetine, elderberry, fenofibrate, fluticasone, glucosamine-chondroit-vit c-mn, hydroxyzine, ibuprofen, levalbuterol, magnesium oxide, melatonin, methylsulfonylmethane, multivitamin, naloxone, omeprazole, ondansetron, potassium, simethicone, sucralfate, cyanocobalamin, vitamin e, and  hydrocodone-acetaminophen, and the following Facility-Administered  Medications: pentafluoroprop-tetrafluoroeth. His primarily concern today is the Neck Pain and Back Pain  Initial Vital Signs:  Pulse/HCG Rate: 80  Temp: (!) 97.3 F (36.3 C) Resp: 18 BP: (!) 150/103 (did not take BP med last night.  Asymptomatic) SpO2: 100 %  BMI: Estimated body mass index is 28.19 kg/m as calculated from the following:   Height as of this encounter: 5\' 7"  (1.702 m).   Weight as of this encounter: 180 lb (81.6 kg).  Risk Assessment: Allergies: Reviewed. He has No Known Allergies.  Allergy Precautions: None required Coagulopathies: Reviewed. None identified.  Blood-thinner therapy: None at this time Active Infection(s): Reviewed. None identified. Jose Washington is afebrile  Site Confirmation: Jose Washington was asked to confirm the procedure and laterality before marking the site Procedure checklist: Completed Consent: Before the procedure and under the influence of no sedative(s), amnesic(s), or anxiolytics, the patient was informed of the treatment options, risks and possible complications. To fulfill our ethical and legal obligations, as recommended by the American Medical Association's Code of Ethics, I have informed the patient of my clinical impression; the nature and purpose of the treatment or procedure; the risks, benefits, and possible complications of the intervention; the alternatives, including doing nothing; the risk(s) and benefit(s) of the alternative treatment(s) or procedure(s); and the risk(s) and benefit(s) of doing nothing. The patient was provided information about the general risks and possible complications associated with the procedure. These may include, but are not limited to: failure to achieve desired goals, infection, bleeding, organ or nerve damage, allergic reactions, paralysis, and death. In addition, the patient was informed of those risks and complications associated to the  procedure, such as failure to decrease pain; infection; bleeding; organ or nerve damage with subsequent damage to sensory, motor, and/or autonomic systems, resulting in permanent pain, numbness, and/or weakness of one or several areas of the body; allergic reactions; (i.e.: anaphylactic reaction); and/or death. Furthermore, the patient was informed of those risks and complications associated with the medications. These include, but are not limited to: allergic reactions (i.e.: anaphylactic or anaphylactoid reaction(s)); adrenal axis suppression; blood sugar elevation that in diabetics may result in ketoacidosis or comma; water retention that in patients with history of congestive heart failure may result in shortness of breath, pulmonary edema, and decompensation with resultant heart failure; weight gain; swelling or edema; medication-induced neural toxicity; particulate matter embolism and blood vessel occlusion with resultant organ, and/or nervous system infarction; and/or aseptic necrosis of one or more joints. Finally, the patient was informed that Medicine is not an exact science; therefore, there is also the possibility of unforeseen or unpredictable risks and/or possible complications that may result in a catastrophic outcome. The patient indicated having understood very clearly. We have given the patient no guarantees and we have made no promises. Enough time was given to the patient to ask questions, all of which were answered to the patient's satisfaction. Mr. Nicodemus has indicated that he wanted to continue with the procedure. Attestation: I, the ordering provider, attest that I have discussed with the patient the benefits, risks, side-effects, alternatives, likelihood of achieving goals, and potential problems during recovery for the procedure that I have provided informed consent. Date  Time: 09/19/2023 10:55 AM   Pre-Procedure Preparation:  Monitoring: As per clinic protocol. Respiration,  ETCO2, SpO2, BP, heart rate and rhythm monitor placed and checked for adequate function Safety Precautions: Patient was assessed for positional comfort and pressure points before starting the procedure. Time-out: I initiated and conducted the "Time-out"  before starting the procedure, as per protocol. The patient was asked to participate by confirming the accuracy of the "Time Out" information. Verification of the correct person, site, and procedure were performed and confirmed by me, the nursing staff, and the patient. "Time-out" conducted as per Joint Commission's Universal Protocol (UP.01.01.01). Time: 1130 Start Time: 1130 hrs.   Narrative                Start Time: 1130 hrs.  Standard Safety Precautions: Protocol guidelines were followed. Aspiration was conducted prior to injection. At no point did I inject any substances, as a needle was being advanced. No attempts were made at seeking a paresthesia. Safe injection practices and needle disposal techniques used. Medications properly checked for expiration dates. SDV (single dose vial) medications used.  Local Anesthesia: Skin & deeper tissues infiltrated with local anesthetic. Appropriate amount of time allowed for local anesthetics to take effect.   Technical description:  The target area was identified and the area prepped in the usual manner. The procedure needles were then advanced to the target area. Proper needle placement secured. Negative aspiration confirmed. Solution injected in intermittent fashion, asking for systemic symptoms every 0.5cc of injectate. The needles were then removed and the area cleansed, making sure to leave some of the prepping solution back to take advantage of its long term bactericidal properties.  Vitals:   09/19/23 1054 09/19/23 1056 09/19/23 1134  BP:  (!) 150/103 (!) 148/96  Pulse:  80 81  Resp:  18 18  Temp: (!) 97.3 F (36.3 C)    SpO2:  100% 100%  Weight: 180 lb (81.6 kg)    Height: 5\' 7"  (1.702 m)        End Time: 1133 hrs.  Imaging Guidance                Type of Imaging Technique: None used Indication(s): N/A Exposure Time: No patient exposure Contrast: None used. Fluoroscopic Guidance: N/A Ultrasound Guidance: N/A Interpretation: N/A   Post-operative Assessment:  Post-procedure Vital Signs:  Pulse/HCG Rate: 81  Temp: (!) 97.3 F (36.3 C) Resp: 18 BP: (!) 148/96 SpO2: 100 %  EBL: None  Complications: No immediate post-treatment complications observed by team, or reported by patient.  Note: The patient tolerated the entire procedure well. A repeat set of vitals were taken after the procedure and the patient was kept under observation following institutional policy, for this type of procedure. Post-procedural neurological assessment was performed, showing return to baseline, prior to discharge. The patient was provided with post-procedure discharge instructions, including a section on how to identify potential problems. Should any problems arise concerning this procedure, the patient was given instructions to immediately contact us, at any time, without hesitation. In any case, we plan to contact the patient by telephone for a follow-up status report regarding this interventional procedure.  Comments:  No additional relevant information.   Plan of Care (POC)  Orders:  Orders Placed This Encounter  Procedures   TRIGGER POINT INJECTION    Scheduling Instructions:     Area: Right trapezius, left erector spinae muscle, (Right) longissimus thoracis muscle     Side: Bilateral     Sedation: No Sedation.     Timeframe: Today    Order Specific Question:   Where will this procedure be performed?    Answer:   [facility]   Informed Consent Details: Physician/Practitioner Attestation; Transcribe to consent form and obtain patient signature    Provider Attestation: I, Kinga Cassar A.  Laban Emperor, MD, (Pain Management Specialist), the physician/practitioner, attest that I have  discussed with the patient the benefits, risks, side effects, alternatives, likelihood of achieving goals and potential problems during recovery for the procedure that I have provided informed consent.    Scheduling Instructions:     Note: Always confirm laterality of pain with Mr. Bridger, before procedure.     Transcribe to consent form and obtain patient signature.    Order Specific Question:   Physician/Practitioner attestation of informed consent for procedure/surgical case    Answer:   I, the physician/practitioner, attest that I have discussed with the patient the benefits, risks, side effects, alternatives, likelihood of achieving goals and potential problems during recovery for the procedure that I have provided informed consent.    Order Specific Question:   Procedure    Answer:   Myoneural Block (Trigger Point injection)    Order Specific Question:   Physician/Practitioner performing the procedure    Answer:   Kolston Lacount A. Laban Emperor MD    Order Specific Question:   Indication/Reason    Answer:   Musculoskeletal pain/myofascial pain secondary to trigger point   Provide equipment / supplies at bedside    Procedure tray: "Block Tray" (Disposable  single use) Skin infiltration needle: Regular 1.5-in, 25-G, (x1) Block Needle type: Regular Amount/quantity: 1 Size: Short(1.5-inch) Gauge: (25G x1) + (22G x1)    Standing Status:   Standing    Number of Occurrences:   1    Order Specific Question:   Specify    Answer:   Block Tray   Chronic Opioid Analgesic:  Hydrocodone/APAP 5/325, 1 tab PO QD (5 mg/day of hydrocodone) MME/day: 5 mg/day.   Medications ordered for procedure: Meds ordered this encounter  Medications   ropivacaine (PF) 2 mg/mL (0.2%) (NAROPIN) injection 9 mL   methylPREDNISolone acetate (DEPO-MEDROL) injection 80 mg   pentafluoroprop-tetrafluoroeth (GEBAUERS) aerosol   lidocaine (XYLOCAINE) 2 % (with pres) injection 400 mg   Medications administered: We administered  ropivacaine (PF) 2 mg/mL (0.2%), methylPREDNISolone acetate, and lidocaine.  See the medical record for exact dosing, route, and time of administration.  Follow-up plan:   Return in about 2 weeks (around 10/03/2023) for (Face2F), (PPE).       Interventional Therapies  Risk Factors  Considerations:   Abnormal UDS (03/27/2020) & (08/20/20) (+) undisclosed carboxy-THC    Planned  Pending:      Under consideration:   Therapeutic right IA hip joint injection #2  Therapeutic right lumbar facet RFA #2    Completed:   Diagnostic/therapeutic right IA hip inj. x1 (12/09/2020) (100/100/95/90-95)  Therapeutic right lumbar facet RFA x2 (11/26/2021) (100/100/75/75)  Palliative right lumbar facet MBB x2 (06/07/2019) (100/100/100 x20 days/>75 x3 months)  Diagnostic/therapeutic left lumbar facet MBB x1 (06/07/2019) (100/100/100 x20 days/>75 x3 months)  Diagnostic/therapeutic left L4-5 LESI x2 (07/25/2018) (n/a)  Palliative left CESI x3 (02/16/2018) (100/100/100/100)  Diagnostic right cervical facet MBB x2 (08/19/2022) (100/100/100/100) (w/ local anesthetic only)  Diagnostic left cervical facet MBB x1 (01/10/2020) (100/100/100)  Therapeutic right cervical facet RFA x1 (04/12/2023) (100/100/90/90)  Diagnostic/therapeutic right shoulder region TPI/MNB x2 (07/19/2022) (100/100/100/100)    Therapeutic  Palliative (PRN) options:   Therapeutic lumbar facet RFA   Therapeutic/palliative lumbar facet MBB   Therapeutic L4-5 LESI   Therapeutic CESI   Therapeutic right IA hip joint injection #2  Diagnostic cervical facet MBB #2    Pharmacotherapy  Nonopioids transferred 08/20/2020: Baclofen       Recent Visits No visits were found meeting these  conditions. Showing recent visits within past 90 days and meeting all other requirements Today's Visits Date Type Provider Dept  09/19/23 Office Visit Delano Metz, MD [facility]  Showing today's visits and meeting all other requirements Future  Appointments Date Type Provider Dept  11/15/23 Appointment Delano Metz, MD [facility]  Showing future appointments within next 90 days and meeting all other requirements  Disposition: Discharge home  Discharge (Date  Time): 09/19/2023; 1135 hrs.   Primary Care Physician: Larae Grooms, NP Location: [facility] Note by: Oswaldo Done, MD (TTS technology used. I apologize for any typographical errors that were not detected and corrected.) Date: 09/19/2023; Time: 11:45 AM  Disclaimer:  Medicine is not an Visual merchandiser. The only guarantee in medicine is that nothing is guaranteed. It is important to note that the decision to proceed with this intervention was based on the information collected from the patient. The Data and conclusions were drawn from the patient's questionnaire, the interview, and the physical examination. Because the information was provided in large part by the patient, it cannot be guaranteed that it has not been purposely or unconsciously manipulated. Every effort has been made to obtain as much relevant data as possible for this evaluation. It is important to note that the conclusions that lead to this procedure are derived in large part from the available data. Always take into account that the treatment will also be dependent on availability of resources and existing treatment guidelines, considered by other Pain Management Practitioners as being common knowledge and practice, at the time of the intervention. For Medico-Legal purposes, it is also important to point out that variation in procedural techniques and pharmacological choices are the acceptable norm. The indications, contraindications, technique, and results of the above procedure should only be interpreted and judged by a Board-Certified Interventional Pain Specialist with extensive familiarity and expertise in the same exact procedure and technique.

## 2023-09-20 ENCOUNTER — Ambulatory Visit (INDEPENDENT_AMBULATORY_CARE_PROVIDER_SITE_OTHER): Payer: No Typology Code available for payment source | Admitting: Nurse Practitioner

## 2023-09-20 ENCOUNTER — Encounter: Payer: Self-pay | Admitting: Nurse Practitioner

## 2023-09-20 VITALS — BP 136/89 | HR 65 | Temp 98.0°F | Ht 67.0 in | Wt 174.4 lb

## 2023-09-20 DIAGNOSIS — E559 Vitamin D deficiency, unspecified: Secondary | ICD-10-CM

## 2023-09-20 DIAGNOSIS — G8929 Other chronic pain: Secondary | ICD-10-CM

## 2023-09-20 DIAGNOSIS — I700 Atherosclerosis of aorta: Secondary | ICD-10-CM

## 2023-09-20 DIAGNOSIS — Z23 Encounter for immunization: Secondary | ICD-10-CM

## 2023-09-20 DIAGNOSIS — E785 Hyperlipidemia, unspecified: Secondary | ICD-10-CM

## 2023-09-20 DIAGNOSIS — I10 Essential (primary) hypertension: Secondary | ICD-10-CM

## 2023-09-20 DIAGNOSIS — M7918 Myalgia, other site: Secondary | ICD-10-CM

## 2023-09-20 DIAGNOSIS — F325 Major depressive disorder, single episode, in full remission: Secondary | ICD-10-CM

## 2023-09-20 MED ORDER — ONDANSETRON HCL 8 MG PO TABS: 8.0000 mg | ORAL_TABLET | Freq: Two times a day (BID) | ORAL | 0 refills | Status: DC

## 2023-09-20 MED ORDER — ATENOLOL 50 MG PO TABS: 50.0000 mg | ORAL_TABLET | Freq: Every day | ORAL | 1 refills | Status: DC

## 2023-09-20 MED ORDER — DULOXETINE HCL 30 MG PO CPEP: 30.0000 mg | ORAL_CAPSULE | Freq: Two times a day (BID) | ORAL | 1 refills | Status: DC

## 2023-09-20 MED ORDER — SUCRALFATE 1 G PO TABS: 1.0000 g | ORAL_TABLET | Freq: Every day | ORAL | 3 refills | Status: DC

## 2023-09-20 MED ORDER — LEVALBUTEROL TARTRATE 45 MCG/ACT IN AERO: 1.0000 | INHALATION_SPRAY | Freq: Three times a day (TID) | RESPIRATORY_TRACT | 12 refills | Status: DC | PRN

## 2023-09-20 MED ORDER — HYDROXYZINE HCL 25 MG PO TABS: 25.0000 mg | ORAL_TABLET | Freq: Three times a day (TID) | ORAL | 1 refills | Status: DC | PRN

## 2023-09-20 MED ORDER — BACLOFEN 10 MG PO TABS: ORAL_TABLET | ORAL | 1 refills | Status: DC

## 2023-09-20 MED ORDER — BUSPIRONE HCL 30 MG PO TABS: 30.0000 mg | ORAL_TABLET | Freq: Two times a day (BID) | ORAL | 1 refills | Status: DC

## 2023-09-20 MED ORDER — OMEPRAZOLE 20 MG PO CPDR: 20.0000 mg | DELAYED_RELEASE_CAPSULE | Freq: Every day | ORAL | 1 refills | Status: DC

## 2023-09-20 MED ORDER — ATORVASTATIN CALCIUM 40 MG PO TABS: 40.0000 mg | ORAL_TABLET | Freq: Every day | ORAL | 1 refills | Status: DC

## 2023-09-20 NOTE — Assessment & Plan Note (Signed)
Chronic.  Controlled.  Continue with current medication regimen of Atenolol  daily.  Refills sent today.  Labs ordered today.  Recommend checking blood pressures at home.  Follow up if blood pressures are greater than 140/90s.  Return to clinic in 6 months for reevaluation.  Call sooner if concerns arise.

## 2023-09-20 NOTE — Progress Notes (Signed)
BP 136/89 (BP Location: Left Arm, Patient Position: Sitting, Cuff Size: Normal)   Pulse 65   Temp 98 F (36.7 C) (Oral)   Ht 5\' 7"  (1.702 m)   Wt 174 lb 6.4 oz (79.1 kg)   SpO2 98%   BMI 27.31 kg/m    Subjective:    Patient ID: Jose Washington, male    DOB: April 09, 1966, 57 y.o.   MRN: [id number]  HPI: Jose Washington is a 57 y.o. male  Chief Complaint  Patient presents with   6 month follow up    Can't take fenofibrate anymore due to having extra pain on top of back pain, helps a lot but too much per patient    Hyperlipidemia   Hypertension   Bronchitis   Medication Refill    All meds    HYPERTENSION / HYPERLIPIDEMIA Satisfied with current treatment? no Duration of hypertension: years BP monitoring frequency: not checking BP range:  BP medication side effects: no Past BP meds: atenolol Duration of hyperlipidemia: years Cholesterol medication side effects: no Cholesterol supplements: none Past cholesterol medications: atorvastain (lipitor), Medication compliance: excellent compliance Aspirin: no Recent stressors: no Recurrent headaches: no Visual changes: no Palpitations: no Dyspnea: no Chest pain: no Lower extremity edema: no Dizzy/lightheaded: no Patient states he can no longer take the fenofibrate due to it making his back pain worse.    DEPRESSION Patient states his mood is hasn't been doing as well.  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  Denies wanting to change medications at visit today.  States the cymbalta and hydroxyzine are working well for him.  Denies SI. Continues with pain management every 3 months.  Relevant past medical, surgical, family and social history reviewed and updated as indicated. Interim medical history since our last visit reviewed. Allergies and medications reviewed and updated.  Review of Systems  Eyes:  Negative for visual disturbance.  Respiratory:  Negative for chest tightness and shortness of breath.    Cardiovascular:  Negative for chest pain, palpitations and leg swelling.  Neurological:  Negative for dizziness, light-headedness and headaches.  Psychiatric/Behavioral:  Negative for dysphoric mood and suicidal ideas. The patient is nervous/anxious.     Per HPI unless specifically indicated above     Objective:    BP 136/89 (BP Location: Left Arm, Patient Position: Sitting, Cuff Size: Normal)   Pulse 65   Temp 98 F (36.7 C) (Oral)   Ht 5\' 7"  (1.702 m)   Wt 174 lb 6.4 oz (79.1 kg)   SpO2 98%   BMI 27.31 kg/m   Wt Readings from Last 3 Encounters:  09/20/23 174 lb 6.4 oz (79.1 kg)  09/19/23 180 lb (81.6 kg)  06/07/23 178 lb (80.7 kg)    Physical Exam Vitals and nursing note reviewed.  Constitutional:      General: He is not in acute distress.    Appearance: Normal appearance. He is normal weight. He is not ill-appearing, toxic-appearing or diaphoretic.  HENT:     Head: Normocephalic.     Right Ear: External ear normal.     Left Ear: External ear normal.     Nose: Nose normal. No congestion or rhinorrhea.     Mouth/Throat:     Mouth: Mucous membranes are moist.  Eyes:     General:        Right eye: No discharge.        Left eye: No discharge.     Extraocular Movements: Extraocular  movements intact.     Conjunctiva/sclera: Conjunctivae normal.     Pupils: Pupils are equal, round, and reactive to light.  Cardiovascular:     Rate and Rhythm: Normal rate and regular rhythm.     Heart sounds: No murmur heard. Pulmonary:     Effort: Pulmonary effort is normal. No respiratory distress.     Breath sounds: Normal breath sounds. No wheezing, rhonchi or rales.  Abdominal:     General: Abdomen is flat. Bowel sounds are normal.  Musculoskeletal:     Cervical back: Normal range of motion and neck supple.  Skin:    General: Skin is warm and dry.     Capillary Refill: Capillary refill takes less than 2 seconds.  Neurological:     General: No focal deficit present.     Mental  Status: He is alert and oriented to person, place, and time.  Psychiatric:        Mood and Affect: Mood normal.        Behavior: Behavior normal.        Thought Content: Thought content normal.        Judgment: Judgment normal.     Results for orders placed or performed in visit on 05/02/23  Tox[employer] Select 13 (MW), Urine  Result Value Ref Range   Summary Note       Assessment & Plan:   Problem List Items Addressed This Visit       Cardiovascular and Mediastinum   Benign essential hypertension    Chronic.  Controlled.  Continue with current medication regimen of Atenolol 50mg  daily.  Refills sent today.  Labs ordered today.  Recommend checking blood pressures at home.  Follow up if blood pressures are greater than 140/90s.  Return to clinic in 6 months for reevaluation.  Call sooner if concerns arise.        Relevant Medications   atenolol (TENORMIN) 50 MG tablet   atorvastatin (LIPITOR) 40 MG tablet   Other Relevant Orders   Comp Met (CMET)   Aortic atherosclerosis (HCC)    Chronic.  Controlled.  Noted on imaging in 08/14/2019.  Continue with current medication regimen of ASA and atorvastatin.  Refills sent today.  Labs ordered today.  Return to clinic in 6 months for reevaluation.  Call sooner if concerns arise.       Relevant Medications   atenolol (TENORMIN) 50 MG tablet   atorvastatin (LIPITOR) 40 MG tablet     Other   Chronic musculoskeletal pain (Chronic)    Continue collaboration with chronic pain management. Refills sent of Baclofen at visit today. Follow up in 6 months.  Call sooner if concerns arise.      Relevant Medications   baclofen (LIORESAL) 10 MG tablet   DULoxetine (CYMBALTA) 30 MG capsule   Hyperlipidemia    Chronic, ongoing.  Continue current medication regimen of Atorvastatin.  Discontinued fenofibrate due to increased back pain.  Lipid panel today.  Refills sent today.  Follow up in 6 months.  Call sooner if concerns arise.       Relevant  Medications   atenolol (TENORMIN) 50 MG tablet   atorvastatin (LIPITOR) 40 MG tablet   Other Relevant Orders   Lipid Profile   Vitamin D insufficiency    Labs ordered at visit today.  Will make recommendations based on lab results.        Relevant Orders   Vitamin D (25 hydroxy)   Major depression in remission (HCC) - Primary  Chronic. Well controlled on Duloxetine 60mg  daily.  Patient also uses Buspar 30mg  BID.  Anxiety slightly worse but feels like he is doing okay with current regimen.  Refill sent today.  Continue with current medication regimen.  Follow up in 6 months.  Call sooner if concerns arise.       Relevant Medications   busPIRone (BUSPAR) 30 MG tablet   DULoxetine (CYMBALTA) 30 MG capsule   hydrOXYzine (ATARAX) 25 MG tablet   Other Visit Diagnoses     Need for influenza vaccination       Relevant Orders   Flu vaccine trivalent PF, 6mos and older(Flulaval,Afluria,Fluarix,Fluzone) (Completed)   Need for COVID-19 vaccine       Relevant Orders   Pfizer Comirnaty Covid -19 Vaccine 78yrs and older        Follow up plan: No follow-ups on file.   A total of 30 minutes were spent on this encounter today.  When total time is documented, this includes both the face-to-face and non-face-to-face time personally spent before, during and after the visit on the date of the encounter discussing symptoms, medications and plan of care.

## 2023-09-20 NOTE — Assessment & Plan Note (Signed)
Labs ordered at visit today.  Will make recommendations based on lab results.   

## 2023-09-20 NOTE — Assessment & Plan Note (Signed)
Chronic.  Controlled.  Noted on imaging in 08/14/2019.  Continue with current medication regimen of ASA and atorvastatin.  Refills sent today.  Labs ordered today.  Return to clinic in 6 months for reevaluation.  Call sooner if concerns arise.

## 2023-09-20 NOTE — Assessment & Plan Note (Signed)
Chronic. Well controlled on Duloxetine 60mg  daily.  Patient also uses Buspar 30mg  BID.  Anxiety slightly worse but feels like he is doing okay with current regimen.  Refill sent today.  Continue with current medication regimen.  Follow up in 6 months.  Call sooner if concerns arise.

## 2023-09-20 NOTE — Assessment & Plan Note (Signed)
Continue collaboration with chronic pain management. Refills sent of Baclofen at visit today. Follow up in 6 months.  Call sooner if concerns arise.

## 2023-09-20 NOTE — Assessment & Plan Note (Signed)
Chronic, ongoing.  Continue current medication regimen of Atorvastatin.  Discontinued fenofibrate due to increased back pain.  Lipid panel today.  Refills sent today.  Follow up in 6 months.  Call sooner if concerns arise.

## 2023-09-21 LAB — COMPREHENSIVE METABOLIC PANEL
ALT: 81 [IU]/L — ABNORMAL HIGH (ref 0–44)
AST: 48 [IU]/L — ABNORMAL HIGH (ref 0–40)
Albumin: 4.8 g/dL (ref 3.8–4.9)
Alkaline Phosphatase: 77 [IU]/L (ref 44–121)
BUN/Creatinine Ratio: 13 (ref 9–20)
BUN: 14 mg/dL (ref 6–24)
Bilirubin Total: 0.7 mg/dL (ref 0.0–1.2)
CO2: 21 mmol/L (ref 20–29)
Calcium: 10.7 mg/dL — ABNORMAL HIGH (ref 8.7–10.2)
Chloride: 99 mmol/L (ref 96–106)
Creatinine, Ser: 1.11 mg/dL (ref 0.76–1.27)
Globulin, Total: 2.6 g/dL (ref 1.5–4.5)
Glucose: 117 mg/dL — ABNORMAL HIGH (ref 70–99)
Potassium: 4.6 mmol/L (ref 3.5–5.2)
Sodium: 137 mmol/L (ref 134–144)
Total Protein: 7.4 g/dL (ref 6.0–8.5)
eGFR: 77 mL/min/{1.73_m2} (ref >59)

## 2023-09-21 LAB — LIPID PANEL
Chol/HDL Ratio: 3.3 ratio (ref 0.0–5.0)
Cholesterol, Total: 174 mg/dL (ref 100–199)
HDL: 53 mg/dL (ref >39)
LDL Chol Calc (NIH): 107 mg/dL — ABNORMAL HIGH (ref 0–99)
Triglycerides: 72 mg/dL (ref 0–149)
VLDL Cholesterol Cal: 14 mg/dL (ref 5–40)

## 2023-09-21 LAB — VITAMIN D 25 HYDROXY (VIT D DEFICIENCY, FRACTURES): Vit D, 25-Hydroxy: 72.4 ng/mL (ref 30.0–100.0)

## 2023-11-03 DIAGNOSIS — M5386 Other specified dorsopathies, lumbar region: Secondary | ICD-10-CM | POA: Insufficient documentation

## 2023-11-15 ENCOUNTER — Ambulatory Visit (HOSPITAL_BASED_OUTPATIENT_CLINIC_OR_DEPARTMENT_OTHER): Payer: No Typology Code available for payment source | Admitting: Pain Medicine

## 2023-11-15 DIAGNOSIS — Z09 Encounter for follow-up examination after completed treatment for conditions other than malignant neoplasm: Secondary | ICD-10-CM

## 2023-11-15 DIAGNOSIS — Z91199 Patient's noncompliance with other medical treatment and regimen due to unspecified reason: Secondary | ICD-10-CM

## 2023-11-15 NOTE — Progress Notes (Signed)
(  11/15/2023) patient called on the day of the appointment to cancel secondary to feeling sick.

## 2023-11-27 NOTE — Progress Notes (Unsigned)
PROVIDER NOTE: Information contained herein reflects review and annotations entered in association with encounter. Interpretation of such information and data should be left to medically-trained personnel. Information provided to patient can be located elsewhere in the medical record under "Patient Instructions". Document created using STT-dictation technology, any transcriptional errors that may result from process are unintentional.    Patient: Jose Washington  Service Category: E/M  Provider: Oswaldo Done, MD  DOB: 05-Jun-1966  DOS: 11/28/2023  Referring Provider: Larae Grooms, NP  MRN: [id number]  Specialty: Interventional Pain Management  PCP: Larae Grooms, NP  Type: Established Patient  Setting: Ambulatory outpatient    Location: Office  Delivery: Face-to-face     HPI  [name], a 58 y.o. year old male, is here today because of his No primary diagnosis found.. [name] primary complain today is No chief complaint on file.  Pertinent problems: [name] has Chronic neck pain (3ry area of Pain) (Bilateral); Chronic pain syndrome; Chronic low back pain (1ry area of Pain) (Bilateral) (L>R) w/o sciatica ; Chronic upper back pain (4th area of Pain) (Bilateral); Chronic shoulder pain (Bilateral) (L>R); Osteoarthritis of AC (acromioclavicular) joint (Right); Chronic sacroiliac joint pain (Bilateral) (L>R); Muscle spasticity; Cervical DDD (C4-5, C5-6, C6-7 and C7-T1); Cervical foraminal stenosis (Bilateral: C5-6 & C6-7, Left: C4-5 & C7-T1); Cervical radiculitis (Bilateral) (R>L); Cervical facet syndrome (Bilateral) (L>R); Cervical spondylosis; Musculoskeletal neck pain (trapezius); Chronic knee pain (Left); Chronic lower extremity pain (2ry area of Pain) (Left); Grade 1 Retrolisthesis of L3 over L4; Lumbar facet arthropathy (Bilateral); Lumbar facet osteoarthritis; Lumbar facet syndrome (Bilateral) (R>L); Lumbar spondylosis; Chronic shoulder pain (Right); Suprascapular  neuropathy (Right); DDD (degenerative disc disease), lumbosacral; Lumbar facet hypertrophy (Multilevel) (Bilateral); Spondylosis without myelopathy or radiculopathy, lumbosacral region; Inflammatory spondylopathy of lumbosacral region [facility]); Chronic lower extremity pain (Right); Chronic musculoskeletal pain; Abnormal MRI, lumbar spine (09/07/2017); Cervicalgia (Bilateral); Spondylosis without myelopathy or radiculopathy, cervical region; Myofascial pain syndrome of thoracic spine (serratus muscle) (Right); Chronic flank pain (Right); Abnormal result on screening urine test (03/27/20 & 08/20/20); Chronic hip pain (Bilateral); Chronic hip pain (Right); Enthesopathy of hip region (Right); Osteoarthritis of hip (Right); Trigger point of shoulder region (Right); Pain of cervical facet joint; Trigger point with neck pain; and Trigger point with back pain on their pertinent problem list. Pain Assessment: Severity of   is reported as a  /10. Location:    / . Onset:  . Quality:  . Timing:  . Modifying factor(s):  Marland Kitchen Vitals:  vitals were not taken for this visit.  BMI: Estimated body mass index is 27.31 kg/m as calculated from the following:   Height as of 09/20/23: 5\' 7"  (1.702 m).   Weight as of 09/20/23: 174 lb 6.4 oz (79.1 kg). Last encounter: 11/15/2023. Last procedure: 04/12/2023.  Reason for encounter:  *** . ***  Discussed the use of AI scribe software for clinical note transcription with the patient, who gave verbal consent to proceed.  History of Present Illness           Pharmacotherapy Assessment  Analgesic:  Hydrocodone/APAP 5/325, 1 tab PO QD (5 mg/day of hydrocodone) MME/day: 5 mg/day.   Monitoring: [place] PMP: PDMP reviewed during this encounter.       Pharmacotherapy: No side-effects or adverse reactions reported. Compliance: No problems identified. Effectiveness: Clinically acceptable.  No notes on file  No results found for: "CBDTHCR" No results found for: "D8THCCBX" No results  found for: "D9THCCBX"  UDS:  Summary  Date Value Ref Range  Status  05/02/2023 Note  Final    Comment:    ==================================================================== [employer]ox[employer] Select 13 (MW) ==================================================================== Test                             Result       Flag       Units  Drug Present and Declared for Prescription Verification   Norhydrocodone                 34           EXPECTED   ng/mg creat    Norhydrocodone is an expected metabolite of hydrocodone.  Drug Present not Declared for Prescription Verification   Oxycodone                      297          UNEXPECTED ng/mg creat   Oxymorphone                    90           UNEXPECTED ng/mg creat   Noroxycodone                   433          UNEXPECTED ng/mg creat   Noroxymorphone                 20           UNEXPECTED ng/mg creat    Sources of oxycodone are scheduled prescription medications.    Oxymorphone, noroxycodone, and noroxymorphone are expected    metabolites of oxycodone. Oxymorphone is also available as a    scheduled prescription medication.  Drug Absent but Declared for Prescription Verification   Hydrocodone                    Not Detected UNEXPECTED ng/mg creat    Hydrocodone is almost always present in patients taking this drug    consistently. Absence of hydrocodone could be due to lapse of time    since the last dose or unusual pharmacokinetics (rapid metabolism).  ==================================================================== Test                      Result    Flag   Units      Ref Range   Creatinine              320              mg/dL      >=98 ==================================================================== Declared Medications:  The flagging and interpretation on this report are based on the  following declared medications.  Unexpected results may arise from  inaccuracies in the declared medications.   **Note: The testing scope of this  panel includes these medications:   Hydrocodone (Norco)   **Note: The testing scope of this panel does not include the  following reported medications:   Acetaminophen (Tylenol)  Acetaminophen (Norco)  Atenolol (Tenormin)  Atorvastatin (Lipitor)  Baclofen (Lioresal)  Buspirone (Buspar)  Calcium  Chondroitin  Desloratadine (Clarinex-D)  Diphenhydramine  Docusate (Colace)  Duloxetine (Cymbalta)  Fenofibrate  Fluticasone (Flonase)  Glucosamine  Hydroxyzine (Atarax)  Ibuprofen (Advil)  Levalbuterol (Xopenex)  Magnesium (Mag-Ox)  Melatonin  Methylsulfonylmethane  Multivitamin  Naloxone (Narcan)  Omeprazole (Prilosec)  Ondansetron (Zofran)  Potassium  Pseudoephedrine (Clarinex-D)  Simethicone  Sucralfate (Carafate)  Supplement (Prevagen)  Vitamin B12  Vitamin D3  Vitamin E ==================================================================== For clinical consultation, please call [phone]. ====================================================================       ROS  Constitutional: Denies any fever or chills Gastrointestinal: No reported hemesis, hematochezia, vomiting, or acute GI distress Musculoskeletal: Denies any acute onset joint swelling, redness, loss of ROM, or weakness Neurological: No reported episodes of acute onset apraxia, aphasia, dysarthria, agnosia, amnesia, paralysis, loss of coordination, or loss of consciousness  Medication Review  Apoaequorin, Calcium Carbonate, DULoxetine, Elderberry, Glucosamine-Chondroit-Vit C-Mn, Methylsulfonylmethane, Potassium, Vitamin D3, acetaminophen, atenolol, atorvastatin, baclofen, busPIRone, cyanocobalamin, desloratadine-pseudoephedrine, diphenhydrAMINE HCl, docusate sodium, fluticasone, hydrOXYzine, ibuprofen, levalbuterol, magnesium oxide, melatonin, multivitamin, omeprazole, ondansetron, simethicone, sucralfate, and vitamin E  History Review  Allergy: Jose Washington has no known allergies. Drug: Jose Washington   reports no history of drug use. Alcohol:  reports no history of alcohol use. Tobacco:  reports that he has never smoked. He has never used smokeless tobacco. Social: Jose Washington  reports that he has never smoked. He has never used smokeless tobacco. He reports that he does not drink alcohol and does not use drugs. Medical:  has a past medical history of Allergy, Anxiety, Chronic duodenal ulcer with hemorrhage (2012), Chronic neck pain, Depression, GERD (gastroesophageal reflux disease), Hyperlipidemia, Hypertension, and Microscopic hematuria. Surgical: Jose Washington  has a past surgical history that includes eye muscle repair (1972 and 1975); bLEEDING ULCER (2012); Flexible sigmoidoscopy; Colonoscopy with propofol (N/A, 03/24/2021); and Esophagogastroduodenoscopy (egd) with propofol (N/A, 03/24/2021). Family: family history includes Allergies in his brother; Cancer in his maternal grandfather and mother; Dementia in his father and paternal grandfather; Depression in his father; Diabetes in his mother; Mental illness in his father; Stroke in his paternal grandfather.  Laboratory Chemistry Profile   Renal Lab Results  Component Value Date   BUN 14 09/20/2023   CREATININE 1.11 09/20/2023   BCR 13 09/20/2023   GFRAA 84 12/24/2020   GFRNONAA >60 02/08/2021    Hepatic Lab Results  Component Value Date   AST 48 (H) 09/20/2023   ALT 81 (H) 09/20/2023   ALBUMIN 4.8 09/20/2023   ALKPHOS 77 09/20/2023   LIPASE 28 02/08/2021    Electrolytes Lab Results  Component Value Date   NA 137 09/20/2023   K 4.6 09/20/2023   CL 99 09/20/2023   CALCIUM 10.7 (H) 09/20/2023   MG 2.0 11/12/2016    Bone Lab Results  Component Value Date   VD25OH 72.4 09/20/2023   25OHVITD1 23 (L) 11/12/2016   25OHVITD2 <1.0 11/12/2016   25OHVITD3 23 11/12/2016    Inflammation (CRP: Acute Phase) (ESR: Chronic Phase) Lab Results  Component Value Date   CRP 1.4 (H) 11/12/2016   ESRSEDRATE 3 11/12/2016         Note:  Above Lab results reviewed.  Recent Imaging Review  DG Cervical Spine With Flex & Extend CLINICAL DATA:  Cervicalgia.  Neck pain.  EXAM: CERVICAL SPINE COMPLETE WITH FLEXION AND EXTENSION VIEWS  COMPARISON:  Cervical spine radiograph 11/12/2016  FINDINGS: There is 2-3 mm grade 1 anterolisthesis of C4 on C5, unchanged on flexion and extension. 1-2 mm anterolisthesis of C3 on C4 is unchanged on flexion, 0 mm on extension. 2-3 mm anterolisthesis of C5 on C6 is unchanged on flexion, 0 mm on extension. Moderate diffuse degenerative disc disease, most prominently affecting C6-C7 and C7-T1. C7-T1 degenerative changes have progressed from 2018. Advanced diffuse facet hypertrophy. Suggestion of diffuse bony neural foraminal narrowing, not well assessed due to positioning. The dens is not well assessed on provided views. No  evidence of fracture or focal bone abnormality. No prevertebral soft tissue thickening.  IMPRESSION: 1. Moderate diffuse degenerative disc disease, most prominently affecting C6-C7 and C7-T1. 2. Advanced diffuse facet hypertrophy. 3. Suggestion of diffuse bony neural foraminal narrowing, not well assessed due to positioning. 4. Grade 1 anterolisthesis of C4 on C5 is unchanged on flexion and extension. Grade 1 anterolisthesis of C3 on C4 and C5 on C6 is unchanged on flexion, 0 mm on extension.  Electronically Signed   By: Narda Rutherford M.D.   On: 04/17/2023 14:28 Note: Reviewed        Physical Exam  General appearance: Well nourished, well developed, and well hydrated. In no apparent acute distress Mental status: Alert, oriented x 3 (person, place, & time)       Respiratory: No evidence of acute respiratory distress Eyes: PERLA Vitals: There were no vitals taken for this visit. BMI: Estimated body mass index is 27.31 kg/m as calculated from the following:   Height as of 09/20/23: 5\' 7"  (1.702 m).   Weight as of 09/20/23: 174 lb 6.4 oz (79.1 kg). Ideal:  Patient weight not recorded  Assessment   Diagnosis Status  No diagnosis found. Controlled Controlled Controlled   Updated Problems: No problems updated.  Plan of Care  Problem-specific:  Assessment and Plan            Jose Washington has a current medication list which includes the following long-term medication(s): atenolol, atorvastatin, baclofen, calcium carbonate, diphenhydramine hcl, duloxetine, levalbuterol, omeprazole, potassium, simethicone, and sucralfate.  Pharmacotherapy (Medications Ordered): No orders of the defined types were placed in this encounter.  Orders:  No orders of the defined types were placed in this encounter.  Follow-up plan:   No follow-ups on file.      Interventional Therapies  Risk Factors  Considerations:   Abnormal UDS (03/27/2020) & (08/20/20) (+) undisclosed carboxy-THC    Planned  Pending:      Under consideration:   Therapeutic right IA hip joint injection #2  Therapeutic right lumbar facet RFA #2    Completed:   Diagnostic/therapeutic right IA hip inj. x1 (12/09/2020) (100/100/95/90-95)  Therapeutic right lumbar facet RFA x2 (11/26/2021) (100/100/75/75)  Palliative right lumbar facet MBB x2 (06/07/2019) (100/100/100 x20 days/>75 x3 months)  Diagnostic/therapeutic left lumbar facet MBB x1 (06/07/2019) (100/100/100 x20 days/>75 x3 months)  Diagnostic/therapeutic left L4-5 LESI x2 (07/25/2018) (n/a)  Palliative left CESI x3 (02/16/2018) (100/100/100/100)  Diagnostic right cervical facet MBB x2 (08/19/2022) (100/100/100/100) (w/ local anesthetic only)  Diagnostic left cervical facet MBB x1 (01/10/2020) (100/100/100)  Therapeutic right cervical facet RFA x1 (04/12/2023) (100/100/90/90)  Diagnostic/therapeutic right shoulder region TPI/MNB x2 (07/19/2022) (100/100/100/100)    Therapeutic  Palliative (PRN) options:   Therapeutic lumbar facet RFA   Therapeutic/palliative lumbar facet MBB   Therapeutic L4-5 LESI   Therapeutic  CESI   Therapeutic right IA hip joint injection #2  Diagnostic cervical facet MBB #2    Pharmacotherapy  Nonopioids transferred 08/20/2020: Baclofen       Recent Visits Date Type Provider Dept  09/19/23 Office Visit Delano Metz, MD [facility]  Showing recent visits within past 90 days and meeting all other requirements Future Appointments Date Type Provider Dept  11/28/23 Appointment Delano Metz, MD [facility]  Showing future appointments within next 90 days and meeting all other requirements  I discussed the assessment and treatment plan with the patient. The patient was provided an opportunity to ask questions and all were answered. The patient  agreed with the plan and demonstrated an understanding of the instructions.  Patient advised to call back or seek an in-person evaluation if the symptoms or condition worsens.  Duration of encounter: *** minutes.  Total time on encounter, as per AMA guidelines included both the face-to-face and non-face-to-face time personally spent by the physician and/or other qualified health care professional(s) on the day of the encounter (includes time in activities that require the physician or other qualified health care professional and does not include time in activities normally performed by clinical staff). Physician's time may include the following activities when performed: Preparing to see the patient (e.g., pre-charting review of records, searching for previously ordered imaging, lab work, and nerve conduction tests) Review of prior analgesic pharmacotherapies. Reviewing PMP Interpreting ordered tests (e.g., lab work, imaging, nerve conduction tests) Performing post-procedure evaluations, including interpretation of diagnostic procedures Obtaining and/or reviewing separately obtained history Performing a medically appropriate examination and/or evaluation Counseling and educating the  patient/family/caregiver Ordering medications, tests, or procedures Referring and communicating with other health care professionals (when not separately reported) Documenting clinical information in the electronic or other health record Independently interpreting results (not separately reported) and communicating results to the patient/ family/caregiver Care coordination (not separately reported)  Note by: Oswaldo Done, MD Date: 11/28/2023; Time: 6:01 PM

## 2023-11-28 ENCOUNTER — Ambulatory Visit: Payer: No Typology Code available for payment source | Attending: Pain Medicine | Admitting: Pain Medicine

## 2023-11-28 ENCOUNTER — Encounter: Payer: Self-pay | Admitting: Pain Medicine

## 2023-11-28 VITALS — BP 141/80 | HR 68 | Temp 97.9°F | Resp 18 | Ht 67.0 in | Wt 165.0 lb

## 2023-11-28 DIAGNOSIS — M5459 Other low back pain: Secondary | ICD-10-CM | POA: Insufficient documentation

## 2023-11-28 DIAGNOSIS — M25511 Pain in right shoulder: Secondary | ICD-10-CM | POA: Diagnosis not present

## 2023-11-28 DIAGNOSIS — Z79899 Other long term (current) drug therapy: Secondary | ICD-10-CM | POA: Diagnosis present

## 2023-11-28 DIAGNOSIS — M5417 Radiculopathy, lumbosacral region: Secondary | ICD-10-CM | POA: Diagnosis present

## 2023-11-28 DIAGNOSIS — M7918 Myalgia, other site: Secondary | ICD-10-CM | POA: Insufficient documentation

## 2023-11-28 DIAGNOSIS — M549 Dorsalgia, unspecified: Secondary | ICD-10-CM | POA: Diagnosis present

## 2023-11-28 DIAGNOSIS — M5386 Other specified dorsopathies, lumbar region: Secondary | ICD-10-CM | POA: Diagnosis present

## 2023-11-28 DIAGNOSIS — Z09 Encounter for follow-up examination after completed treatment for conditions other than malignant neoplasm: Secondary | ICD-10-CM | POA: Diagnosis present

## 2023-11-28 DIAGNOSIS — M4726 Other spondylosis with radiculopathy, lumbar region: Secondary | ICD-10-CM

## 2023-11-28 DIAGNOSIS — M25512 Pain in left shoulder: Secondary | ICD-10-CM | POA: Insufficient documentation

## 2023-11-28 DIAGNOSIS — G894 Chronic pain syndrome: Secondary | ICD-10-CM | POA: Insufficient documentation

## 2023-11-28 DIAGNOSIS — M5442 Lumbago with sciatica, left side: Secondary | ICD-10-CM | POA: Diagnosis present

## 2023-11-28 DIAGNOSIS — M4316 Spondylolisthesis, lumbar region: Secondary | ICD-10-CM

## 2023-11-28 DIAGNOSIS — M5416 Radiculopathy, lumbar region: Secondary | ICD-10-CM | POA: Insufficient documentation

## 2023-11-28 DIAGNOSIS — M51372 Other intervertebral disc degeneration, lumbosacral region with discogenic back pain and lower extremity pain: Secondary | ICD-10-CM | POA: Diagnosis present

## 2023-11-28 DIAGNOSIS — Z79891 Long term (current) use of opiate analgesic: Secondary | ICD-10-CM | POA: Diagnosis present

## 2023-11-28 DIAGNOSIS — R937 Abnormal findings on diagnostic imaging of other parts of musculoskeletal system: Secondary | ICD-10-CM | POA: Diagnosis present

## 2023-11-28 DIAGNOSIS — F1190 Opioid use, unspecified, uncomplicated: Secondary | ICD-10-CM | POA: Insufficient documentation

## 2023-11-28 DIAGNOSIS — M79605 Pain in left leg: Secondary | ICD-10-CM | POA: Diagnosis not present

## 2023-11-28 DIAGNOSIS — M542 Cervicalgia: Secondary | ICD-10-CM | POA: Insufficient documentation

## 2023-11-28 DIAGNOSIS — G8929 Other chronic pain: Secondary | ICD-10-CM | POA: Insufficient documentation

## 2023-11-28 DIAGNOSIS — M47816 Spondylosis without myelopathy or radiculopathy, lumbar region: Secondary | ICD-10-CM | POA: Insufficient documentation

## 2023-11-28 DIAGNOSIS — M545 Low back pain, unspecified: Secondary | ICD-10-CM | POA: Insufficient documentation

## 2023-11-28 DIAGNOSIS — M4310 Spondylolisthesis, site unspecified: Secondary | ICD-10-CM | POA: Insufficient documentation

## 2023-11-28 MED ORDER — HYDROCODONE-ACETAMINOPHEN 5-325 MG PO TABS: 1.0000 | ORAL_TABLET | Freq: Every day | ORAL | 0 refills | Status: DC | PRN

## 2023-11-28 NOTE — Progress Notes (Signed)
Nursing Pain Medication Assessment:  Safety precautions to be maintained throughout the outpatient stay will include: orient to surroundings, keep bed in low position, maintain call bell within reach at all times, provide assistance with transfer out of bed and ambulation.  Medication Inspection Compliance: Jose Washington did not comply with our request to bring his pills to be counted. He was reminded that bringing the medication bottles, even when empty, is a requirement.  Medication: None brought in. Pill/Patch Count: None available to be counted. Bottle Appearance: No container available. Did not bring bottle(s) to appointment. Filled Date: N/A Last Medication intake:  Ran out of medicine more than 48 hours agoSafety precautions to be maintained throughout the outpatient stay will include: orient to surroundings, keep bed in low position, maintain call bell within reach at all times, provide assistance with transfer out of bed and ambulation.

## 2023-11-28 NOTE — Patient Instructions (Addendum)
Epidural Steroid Injection  An epidural steroid injection is a shot of steroid medicine, also called cortisone, and a numbing medicine that is given into the epidural space. This space is between the spinal cord and the bones of the back. This shot helps relieve pain caused by an irritated or swollen nerve root. The pain relief you get from the injection depends on the cause of your condition and how long your pain lasts. You may have a period of slightly more pain after your injection, before the steroid medicine takes effect. This medicine usually starts working within 1-3 days. In some cases, you might need 7-10 days to feel the full effect. Tell your health care provider about: Any allergies you have. All medicines you are taking, including vitamins, herbs, eye drops, creams, and over-the-counter medicines. Any problems you or family members have had with anesthesia. Any bleeding problems you have. Any surgeries you have had. Any medical conditions you have. Whether you are pregnant or may be pregnant. What are the risks? Your health care provider will talk with you about risks. These may include: Headache. Bleeding. Infection. Allergic reaction to medicines or dyes. Nerve damage. Not being able to move (paralysis). This is rare. What happens before the procedure? Medicines Ask your provider about: Changing or stopping your regular medicines. These include any diabetes medicines or blood thinners you take. Taking medicines such as aspirin and ibuprofen. These medicines can thin your blood. Do not take them unless your provider tells you to. Taking over-the-counter medicines, vitamins, herbs, and supplements. General instructions Follow instructions from your provider about what you may eat and drink. Ask your provider what steps will be taken to help prevent infection. If you will be going home right after the procedure, plan to have a responsible adult: Take you home from the  hospital or clinic. You will not be allowed to drive. Care for you for the time you are told. If you have diabetes or prediabetes, talk with your provider about how to manage your blood sugar (glucose). Steroid medicine can make your blood sugar go up and stay high for a few days. Your provider can make a plan to make sure your blood sugar level stays under control. What happens during the procedure? An IV will be inserted into one of your veins. You may be given a sedative to help you relax. You will be asked to sit or lie on your side. The injection site will be cleaned. An X-ray machine will be used to guide the needle close to the nerve that is causing pain. A needle will be put through your skin into the epidural space. This may cause you some discomfort. Contrast dye may be injected at the site to make sure that the steroid medicine will be sent to the exact place it needs to go. The steroid medicine and a numbing medicine will be injected into the epidural space for pain relief. The needle will be removed. A bandage (dressing) will be put over the injection site. The procedure may vary among providers and hospitals. What happens after the procedure? Your blood pressure, heart rate, breathing rate, and blood oxygen level will be monitored until you leave the hospital or clinic. Your IV will be removed. Your arm or leg may feel weak or numb for a few hours. This information is not intended to replace advice given to you by your health care provider. Make sure you discuss any questions you have with your health care provider. Document Revised:  02/04/2023 Document Reviewed: 05/28/2022 Elsevier Patient Education  2024 [employer] ______________________________________________________________________    Procedure instructions  Stop blood-thinners  Do not eat or drink fluids (other than water) for 6 hours before your procedure  No water for 2 hours before your procedure  Take your  blood pressure medicine with a sip of water  Arrive 30 minutes before your appointment  If sedation is planned, bring suitable driver. Pennie Banter, Benedetto Goad, & public transportation are NOT APPROVED)  Carefully read the "Preparing for your procedure" detailed instructions  If you have questions call us at [phone]  Procedure appointments are for procedures only. NO medication refills or new problem evaluations.   ______________________________________________________________________      ______________________________________________________________________    Preparing for your procedure  Appointments: If you think you may not be able to keep your appointment, call 24-48 hours in advance to cancel. We need time to make it available to others.  Procedure visits are for procedures only. During your procedure appointment there will be: NO Prescription Refills*. NO medication changes or discussions*. NO discussion of disability issues*. NO unrelated pain problem evaluations*. NO evaluations to order other pain procedures*. *These will be addressed at a separate and distinct evaluation encounter on the provider's evaluation schedule and not during procedure days.  Instructions: Food intake: Avoid eating anything solid for at least 8 hours prior to your procedure. Clear liquid intake: You may take clear liquids such as water up to 2 hours prior to your procedure. (No carbonated drinks. No soda.) Transportation: Unless otherwise stated by your physician, bring a driver. (Driver cannot be a Market researcher, Pharmacist, community, or any other form of public transportation.) Morning Medicines: Except for blood thinners, take all of your other morning medications with a sip of water. Make sure to take your heart and blood pressure medicines. If your blood pressure's lower number is above 100, the case will be rescheduled. Blood thinners: Make sure to stop your blood thinners as instructed.  If you take a blood thinner,  but were not instructed to stop it, call our office [phone] and ask to talk to a nurse. Not stopping a blood thinner prior to certain procedures could lead to serious complications. Diabetics on insulin: Notify the staff so that you can be scheduled 1st case in the morning. If your diabetes requires high dose insulin, take only  of your normal insulin dose the morning of the procedure and notify the staff that you have done so. Preventing infections: Shower with an antibacterial soap the morning of your procedure.  Build-up your immune system: Take 1000 mg of Vitamin C with every meal (3 times a day) the day prior to your procedure. Antibiotics: Inform the nursing staff if you are taking any antibiotics or if you have any conditions that may require antibiotics prior to procedures. (Example: recent joint implants)   Pregnancy: If you are pregnant make sure to notify the nursing staff. Not doing so may result in injury to the fetus, including death.  Sickness: If you have a cold, fever, or any active infections, call and cancel or reschedule your procedure. Receiving steroids while having an infection may result in complications. Arrival: You must be in the facility at least 30 minutes prior to your scheduled procedure. Tardiness: Your scheduled time is also the cutoff time. If you do not arrive at least 15 minutes prior to your procedure, you will be rescheduled.  Children: Do not bring any children with you. Make arrangements to keep them home.  Dress appropriately: There is always a possibility that your clothing may get soiled. Avoid long dresses. Valuables: Do not bring any jewelry or valuables.  Reasons to call and reschedule or cancel your procedure: (Following these recommendations will minimize the risk of a serious complication.) Surgeries: Avoid having procedures within 2 weeks of any surgery. (Avoid for 2 weeks before or after any surgery). Flu Shots: Avoid having procedures within 2  weeks of a flu shots or . (Avoid for 2 weeks before or after immunizations). Barium: Avoid having a procedure within 7-10 days after having had a radiological study involving the use of radiological contrast. (Myelograms, Barium swallow or enema study). Heart attacks: Avoid any elective procedures or surgeries for the initial 6 months after a "Myocardial Infarction" (Heart Attack). Blood thinners: It is imperative that you stop these medications before procedures. Let us know if you if you take any blood thinner.  Infection: Avoid procedures during or within two weeks of an infection (including chest colds or gastrointestinal problems). Symptoms associated with infections include: Localized redness, fever, chills, night sweats or profuse sweating, burning sensation when voiding, cough, congestion, stuffiness, runny nose, sore throat, diarrhea, nausea, vomiting, cold or Flu symptoms, recent or current infections. It is specially important if the infection is over the area that we intend to treat. Heart and lung problems: Symptoms that may suggest an active cardiopulmonary problem include: cough, chest pain, breathing difficulties or shortness of breath, dizziness, ankle swelling, uncontrolled high or unusually low blood pressure, and/or palpitations. If you are experiencing any of these symptoms, cancel your procedure and contact your primary care physician for an evaluation.  Remember:  Regular Business hours are:  Monday to Thursday 8:00 AM to 4:00 PM  Provider's Schedule: Delano Metz, MD:  Procedure days: Tuesday and Thursday 7:30 AM to 4:00 PM  Edward Jolly, MD:  Procedure days: Monday and Wednesday 7:30 AM to 4:00 PM Last  Updated: 10/11/2023 ______________________________________________________________________      ______________________________________________________________________    General Risks and Possible Complications  Patient Responsibilities: It is important that you  read this as it is part of your informed consent. It is our duty to inform you of the risks and possible complications associated with treatments offered to you. It is your responsibility as a patient to read this and to ask questions about anything that is not clear or that you believe was not covered in this document.  Patient's Rights: You have the right to refuse treatment. You also have the right to change your mind, even after initially having agreed to have the treatment done. However, under this last option, if you wait until the last second to change your mind, you may be charged for the materials used up to that point.  Introduction: Medicine is not an Visual merchandiser. Everything in Medicine, including the lack of treatment(s), carries the potential for danger, harm, or loss (which is by definition: Risk). In Medicine, a complication is a secondary problem, condition, or disease that can aggravate an already existing one. All treatments carry the risk of possible complications. The fact that a side effects or complications occurs, does not imply that the treatment was conducted incorrectly. It must be clearly understood that these can happen even when everything is done following the highest safety standards.  No treatment: You can choose not to proceed with the proposed treatment alternative. The "PRO(s)" would include: avoiding the risk of complications associated with the therapy. The "CON(s)" would include: not getting any of the treatment  benefits. These benefits fall under one of three categories: diagnostic; therapeutic; and/or palliative. Diagnostic benefits include: getting information which can ultimately lead to improvement of the disease or symptom(s). Therapeutic benefits are those associated with the successful treatment of the disease. Finally, palliative benefits are those related to the decrease of the primary symptoms, without necessarily curing the condition (example: decreasing the  pain from a flare-up of a chronic condition, such as incurable terminal cancer).  General Risks and Complications: These are associated to most interventional treatments. They can occur alone, or in combination. They fall under one of the following six (6) categories: no benefit or worsening of symptoms; bleeding; infection; nerve damage; allergic reactions; and/or death. No benefits or worsening of symptoms: In Medicine there are no guarantees, only probabilities. No healthcare provider can ever guarantee that a medical treatment will work, they can only state the probability that it may. Furthermore, there is always the possibility that the condition may worsen, either directly, or indirectly, as a consequence of the treatment. Bleeding: This is more common if the patient is taking a blood thinner, either prescription or over the counter (example: Goody Powders, Fish oil, Aspirin, Garlic, etc.), or if suffering a condition associated with impaired coagulation (example: Hemophilia, cirrhosis of the liver, low platelet counts, etc.). However, even if you do not have one on these, it can still happen. If you have any of these conditions, or take one of these drugs, make sure to notify your treating physician. Infection: This is more common in patients with a compromised immune system, either due to disease (example: diabetes, cancer, human immunodeficiency virus [HIV], etc.), or due to medications or treatments (example: therapies used to treat cancer and rheumatological diseases). However, even if you do not have one on these, it can still happen. If you have any of these conditions, or take one of these drugs, make sure to notify your treating physician. Nerve Damage: This is more common when the treatment is an invasive one, but it can also happen with the use of medications, such as those used in the treatment of cancer. The damage can occur to small secondary nerves, or to large primary ones, such as those  in the spinal cord and brain. This damage may be temporary or permanent and it may lead to impairments that can range from temporary numbness to permanent paralysis and/or brain death. Allergic Reactions: Any time a substance or material comes in contact with our body, there is the possibility of an allergic reaction. These can range from a mild skin rash (contact dermatitis) to a severe systemic reaction (anaphylactic reaction), which can result in death. Death: In general, any medical intervention can result in death, most of the time due to an unforeseen complication. ______________________________________________________________________      ______________________________________________________________________    Opioid Pain Medication Update  To: All patients taking opioid pain medications. (I.e.: hydrocodone, hydromorphone, oxycodone, oxymorphone, morphine, codeine, methadone, tapentadol, tramadol, buprenorphine, fentanyl, etc.)  Re: Updated review of side effects and adverse reactions of opioid analgesics, as well as new information about long term effects of this class of medications.  Direct risks of long-term opioid therapy are not limited to opioid addiction and overdose. Potential medical risks include serious fractures, breathing problems during sleep, hyperalgesia, immunosuppression, chronic constipation, bowel obstruction, myocardial infarction, and tooth decay secondary to xerostomia.  Unpredictable adverse effects that can occur even if you take your medication correctly: Cognitive impairment, respiratory depression, and death. Most people think that if they take their  medication "correctly", and "as instructed", that they will be safe. Nothing could be farther from the truth. In reality, a significant amount of recorded deaths associated with the use of opioids has occurred in individuals that had taken the medication for a long time, and were taking their medication correctly.  The following are examples of how this can happen: Patient taking his/her medication for a long time, as instructed, without any side effects, is given a certain antibiotic or another unrelated medication, which in turn triggers a "Drug-to-drug interaction" leading to disorientation, cognitive impairment, impaired reflexes, respiratory depression or an untoward event leading to serious bodily harm or injury, including death.  Patient taking his/her medication for a long time, as instructed, without any side effects, develops an acute impairment of liver and/or kidney function. This will lead to a rapid inability of the body to breakdown and eliminate their pain medication, which will result in effects similar to an "overdose", but with the same medicine and dose that they had always taken. This again may lead to disorientation, cognitive impairment, impaired reflexes, respiratory depression or an untoward event leading to serious bodily harm or injury, including death.  A similar problem will occur with patients as they grow older and their liver and kidney function begins to decrease as part of the aging process.  Background information: Historically, the original case for using long-term opioid therapy to treat chronic noncancer pain was based on safety assumptions that subsequent experience has called into question. In 1996, the American Pain Society and the American Academy of Pain Medicine issued a consensus statement supporting long-term opioid therapy. This statement acknowledged the dangers of opioid prescribing but concluded that the risk for addiction was low; respiratory depression induced by opioids was short-lived, occurred mainly in opioid-naive patients, and was antagonized by pain; tolerance was not a common problem; and efforts to control diversion should not constrain opioid prescribing. This has now proven to be wrong. Experience regarding the risks for opioid addiction, misuse, and overdose  in community practice has failed to support these assumptions.  According to the Centers for Disease Control and Prevention, fatal overdoses involving opioid analgesics have increased sharply over the past decade. Currently, more than 96,700 people die from drug overdoses every year. Opioids are a factor in 7 out of every 10 overdose deaths. Deaths from drug overdose have surpassed motor vehicle accidents as the leading cause of death for individuals between the ages of 84 and 40.  Clinical data suggest that neuroendocrine dysfunction may be very common in both men and women, potentially causing hypogonadism, erectile dysfunction, infertility, decreased libido, osteoporosis, and depression. Recent studies linked higher opioid dose to increased opioid-related mortality. Controlled observational studies reported that long-term opioid therapy may be associated with increased risk for cardiovascular events. Subsequent meta-analysis concluded that the safety of long-term opioid therapy in elderly patients has not been proven.   Side Effects and adverse reactions: Common side effects: Drowsiness (sedation). Dizziness. Nausea and vomiting. Constipation. Physical dependence -- Dependence often manifests with withdrawal symptoms when opioids are discontinued or decreased. Tolerance -- As you take repeated doses of opioids, you require increased medication to experience the same effect of pain relief. Respiratory depression -- This can occur in healthy people, especially with higher doses. However, people with COPD, asthma or other lung conditions may be even more susceptible to fatal respiratory impairment.  Uncommon side effects: An increased sensitivity to feeling pain and extreme response to pain (hyperalgesia). Chronic use of opioids can lead to  this. Delayed gastric emptying (the process by which the contents of your stomach are moved into your small intestine). Muscle rigidity. Immune system and  hormonal dysfunction. Quick, involuntary muscle jerks (myoclonus). Arrhythmia. Itchy skin (pruritus). Dry mouth (xerostomia).  Long-term side effects: Chronic constipation. Sleep-disordered breathing (SDB). Increased risk of bone fractures. Hypothalamic-pituitary-adrenal dysregulation. Increased risk of overdose.  RISKS: Respiratory depression and death: Opioids increase the risk of respiratory depression and death.  Drug-to-drug interactions: Opioids are relatively contraindicated in combination with benzodiazepines, sleep inducers, and other central nervous system depressants. Other classes of medications (i.e.: certain antibiotics and even over-the-counter medications) may also trigger or induce respiratory depression in some patients.  Medical conditions: Patients with pre-existing respiratory problems are at higher risk of respiratory failure and/or depression when in combination with opioid analgesics. Opioids are relatively contraindicated in some medical conditions such as central sleep apnea.   Fractures and Falls:  Opioids increase the risk and incidence of falls. This is of particular importance in elderly patients.  Endocrine System:  Long-term administration is associated with endocrine abnormalities (endocrinopathies). (Also known as Opioid-induced Endocrinopathy) Influences on both the hypothalamic-pituitary-adrenal axis?and the hypothalamic-pituitary-gonadal axis have been demonstrated with consequent hypogonadism and adrenal insufficiency in both sexes. Hypogonadism and decreased levels of dehydroepiandrosterone sulfate have been reported in men and women. Endocrine effects include: Amenorrhoea in women (abnormal absence of menstruation) Reduced libido in both sexes Decreased sexual function Erectile dysfunction in men Hypogonadisms (decreased testicular function with shrinkage of testicles) Infertility Depression and fatigue Loss of muscle  mass Anxiety Depression Immune suppression Hyperalgesia Weight gain Anemia Osteoporosis Patients (particularly women of childbearing age) should avoid opioids. There is insufficient evidence to recommend routine monitoring of asymptomatic patients taking opioids in the long-term for hormonal deficiencies.  Immune System: Human studies have demonstrated that opioids have an immunomodulating effect. These effects are mediated via opioid receptors both on immune effector cells and in the central nervous system. Opioids have been demonstrated to have adverse effects on antimicrobial response and anti-tumour surveillance. Buprenorphine has been demonstrated to have no impact on immune function.  Opioid Induced Hyperalgesia: Human studies have demonstrated that prolonged use of opioids can lead to a state of abnormal pain sensitivity, sometimes called opioid induced hyperalgesia (OIH). Opioid induced hyperalgesia is not usually seen in the absence of tolerance to opioid analgesia. Clinically, hyperalgesia may be diagnosed if the patient on long-term opioid therapy presents with increased pain. This might be qualitatively and anatomically distinct from pain related to disease progression or to breakthrough pain resulting from development of opioid tolerance. Pain associated with hyperalgesia tends to be more diffuse than the pre-existing pain and less defined in quality. Management of opioid induced hyperalgesia requires opioid dose reduction.  Cancer: Chronic opioid therapy has been associated with an increased risk of cancer among noncancer patients with chronic pain. This association was more evident in chronic strong opioid users. Chronic opioid consumption causes significant pathological changes in the small intestine and colon. Epidemiological studies have found that there is a link between opium dependence and initiation of gastrointestinal cancers. Cancer is the second leading cause of death  after cardiovascular disease. Chronic use of opioids can cause multiple conditions such as GERD, immunosuppression and renal damage as well as carcinogenic effects, which are associated with the incidence of cancers.   Mortality: Long-term opioid use has been associated with increased mortality among patients with chronic non-cancer pain (CNCP).  Prescription of long-acting opioids for chronic noncancer pain was associated with a significantly increased risk  of all-cause mortality, including deaths from causes other than overdose.  Reference: Von Korff M, Kolodny A, Deyo RA, Chou R. Long-term opioid therapy reconsidered. Ann Intern Med. 2011 Sep 6;155(5):325-8. doi: 10.7326/[id number]. PMID: [id number]; PMCID: [id number]. Randon Goldsmith, Hayward RA, Dunn KM, [place] KP. Risk of adverse events in patients prescribed long-term opioids: A cohort study in the [place] [facility] Research Datalink. Eur J Pain. 2019 May;23(5):908-922. doi: 10.1002/ejp.1357. Epub 2019 Jan 31. PMID: 78295621. Colameco S, Coren JS, Ciervo CA. Continuous opioid treatment for chronic noncancer pain: a time for moderation in prescribing. Postgrad Med. 2009 Jul;121(4):61-6. doi: 10.3810/pgm.2009.07.2032. PMID: 30865784. William Hamburger RN, [place] SD, Blazina I, Cristopher Peru, Bougatsos C, Deyo RA. The effectiveness and risks of long-term opioid therapy for chronic pain: a systematic review for a [employer] of [employer]ways to [employer]. Ann Intern Med. 2015 Feb 17;162(4):276-86. doi: 10.7326/M14-2559. PMID: 69629528. Caryl Bis [facility], [place] DM. NCHS Data Brief No. 22. [place]: Centers for Disease Control and Prevention; 2009. Sep, Increase in Fatal Poisonings Involving Opioid Analgesics in the [place], 1999-2006. Song IA, Choi HR, Oh TK. Long-term opioid use and mortality in patients with chronic non-cancer pain: Ten-year follow-up study in [place] from  2010 through 2019. EClinicalMedicine. 2022 Jul 18;51:101558. doi: 10.1016/j.eclinm.2022.413244. PMID: 01027253; PMCID: GUY4034742. Huser, W., Schubert, T., Vogelmann, T. et al. All-cause mortality in patients with long-term opioid therapy compared with non-opioid analgesics for chronic non-cancer pain: a database study. BMC Med 18, 162 (2020). http://lester.info/ Rashidian H, Karie Kirks, Malekzadeh R, Haghdoost AA. An Ecological Study of the Association between Opiate Use and Incidence of Cancers. Addict Health. 2016 Fall;8(4):252-260. PMID: 59563875; PMCID: IEP3295188.  Our Goal: Our goal is to control your pain with means other than the use of opioid pain medications.  Our Recommendation: Talk to your physician about coming off of these medications. We can assist you with the tapering down and stopping these medicines. Based on the new information, even if you cannot completely stop the medication, a decrease in the dose may be associated with a lesser risk. Ask for other means of controlling the pain. Decrease or eliminate those factors that significantly contribute to your pain such as smoking, obesity, and a diet heavily tilted towards "inflammatory" nutrients.  Last Updated: 05/09/2023   ______________________________________________________________________       ______________________________________________________________________    National Pain Medication Shortage  The U[place] is experiencing worsening drug shortages. These have had a negative widespread effect on patient care and treatment. Not expected to improve any time soon. Predicted to last past 2029.   Drug shortage list (generic names) Oxycodone IR Oxycodone/APAP Oxymorphone IR Hydromorphone Hydrocodone/APAP Morphine  Where is the problem?  Manufacturing and supply level.  Will this shortage affect you?  Only if you take any of the above pain medications.  How? You may be  unable to fill your prescription.  Your pharmacist may offer a "partial fill" of your prescription. (Warning: Do not accept partial fills.) Prescriptions partially filled cannot be transferred to another pharmacy. Read our Medication Rules and Regulation. Depending on how much medicine you are dependent on, you may experience withdrawals when unable to get the medication.  Recommendations: Consider ending your dependence on opioid pain medications. Ask your pain specialist to assist you with the process. Consider switching to a medication currently not in shortage, such as Buprenorphine. Talk to your pain specialist about this option. Consider decreasing your pain medication requirements by managing  tolerance thru "Drug Holidays". This may help minimize withdrawals, should you run out of medicine. Control your pain thru the use of non-pharmacological interventional therapies.   Your prescriber: Prescribers cannot be blamed for shortages. Medication manufacturing and supply issues cannot be fixed by the prescriber.   NOTE: The prescriber is not responsible for supplying the medication, or solving supply issues. Work with your pharmacist to solve it. The patient is responsible for the decision to take or continue taking the medication and for identifying and securing a legal supply source. By law, supplying the medication is the job and responsibility of the pharmacy. The prescriber is responsible for the evaluation, monitoring, and prescribing of these medications.   Prescribers will NOT: Re-issue prescriptions that have been partially filled. Re-issue prescriptions already sent to a pharmacy.  Re-send prescriptions to a different pharmacy because yours did not have your medication. Ask pharmacist to order more medicine or transfer the prescription to another pharmacy. (Read below.)  New 2023 regulation: "July 02, 2022 Revised Regulation Allows DEA-Registered Pharmacies to Transfer  Electronic Prescriptions at a Patient's Request DEA Headquarters Division - Public Information Office Patients now have the ability to request their electronic prescription be transferred to another pharmacy without having to go back to their practitioner to initiate the request. This revised regulation went into effect on Monday, June 28, 2022.     At a patient's request, a DEA-registered retail pharmacy can now transfer an electronic prescription for a controlled substance (schedules II-V) to another DEA-registered retail pharmacy. Prior to this change, patients would have to go through their practitioner to cancel their prescription and have it re-issued to a different pharmacy. The process was taxing and time consuming for both patients and practitioners.    The Drug Enforcement Administration [facility]) published its intent to revise the process for transferring electronic prescriptions on September 19, 2020.  The final rule was published in the federal register on May 27, 2022 and went into effect 30 days later.  Under the final rule, a prescription can only be transferred once between pharmacies, and only if allowed under existing state or other applicable law. The prescription must remain in its electronic form; may not be altered in any way; and the transfer must be communicated directly between two licensed pharmacists. It's important to note, any authorized refills transfer with the original prescription, which means the entire prescription will be filled at the same pharmacy".  Reference: [email or web address].is [facility] website announcement)  CheapWipes.at.pdf Financial planner of Justice)   [employer] / Vol. 88, No. 143 / Thursday, May 27, 2022 / Rules and Regulations DEPARTMENT OF JUSTICE  Drug Enforcement Administration  21 CFR Part  1306  [Docket No. [id number]]  RIN [id number] Transfer of Electronic Prescriptions for Schedules II-V Controlled Substances Between Pharmacies for Initial Filling  ______________________________________________________________________       ______________________________________________________________________    Transfer of Pain Medication between Pharmacies  Re: 2023 DEA Clarification on existing regulation  Published on DEA Website: July 02, 2022  Title: Revised Regulation Allows DEA-Registered Pharmacies to Electrical engineer Prescriptions at a Patient's Request DEA Headquarters Division - [employer]  "Patients now have the ability to request their electronic prescription be transferred to another pharmacy without having to go back to their practitioner to initiate the request. This revised regulation went into effect on Monday, June 28, 2022.     At a patient's request, a DEA-registered retail pharmacy can now transfer an electronic prescription for a controlled substance (schedules  II-V) to another DEA-registered retail pharmacy. Prior to this change, patients would have to go through their practitioner to cancel their prescription and have it re-issued to a different pharmacy. The process was taxing and time consuming for both patients and practitioners.    The Drug Enforcement Administration [facility]) published its intent to revise the process for transferring electronic prescriptions on September 19, 2020.  The final rule was published in the federal register on May 27, 2022 and went into effect 30 days later.  Under the final rule, a prescription can only be transferred once between pharmacies, and only if allowed under existing state or other applicable law. The prescription must remain in its electronic form; may not be altered in any way; and the transfer must be communicated directly between two licensed pharmacists. It's important to note, any authorized refills  transfer with the original prescription, which means the entire prescription will be filled at the same pharmacy."    REFERENCES: 1. DEA website announcement [email or web address]  2. Department of Justice website  CheapWipes.at.pdf  3. DEPARTMENT OF JUSTICE Drug Enforcement Administration 21 CFR Part 1306 [Docket No. [id number]] RIN [id number]-AB64 "Transfer of Electronic Prescriptions for Schedules II-V Controlled Substances Between Pharmacies for Initial Filling"  ______________________________________________________________________       ______________________________________________________________________    Medication Rules  Purpose: To inform patients, and their family members, of our medication rules and regulations.  Applies to: All patients receiving prescriptions from our practice (written or electronic).  Pharmacy of record: This is the pharmacy where your electronic prescriptions will be sent. Make sure we have the correct one.  Electronic prescriptions: In compliance with the [facility] Strengthen Opioid Misuse Prevention (STOP) Act of 2017 (Session Conni Elliot [phone]), effective November 01, 2018, all controlled substances must be electronically prescribed. Written prescriptions, faxing, or calling prescriptions to a pharmacy will no longer be done.  Prescription refills: These will be provided only during in-person appointments. No medications will be renewed without a "face-to-face" evaluation with your provider. Applies to all prescriptions.  NOTE: The following applies primarily to controlled substances (Opioid* Pain Medications).   Type of encounter (visit): For patients receiving controlled substances, face-to-face visits are required. (Not an option and not up to the patient.)  Patient's Responsibilities: Pain Pills: Bring  all pain pills to every appointment (except for procedure appointments). Pill counts are required.  Pill Bottles: Bring pills in original pharmacy bottle. Bring bottle, even if empty. Always bring the bottle of the most recent fill.  Medication refills: You are responsible for knowing and keeping track of what medications you are taking and when is it that you will need a refill. The day before your appointment: write a list of all prescriptions that need to be refilled. The day of the appointment: give the list to the admitting nurse. Prescriptions will be written only during appointments. No prescriptions will be written on procedure days. If you forget a medication: it will not be "Called in", "Faxed", or "electronically sent". You will need to get another appointment to get these prescribed. No early refills. Do not call asking to have your prescription filled early. Partial  or short prescriptions: Occasionally your pharmacy may not have enough pills to fill your prescription.  NEVER ACCEPT a partial fill or a prescription that is short of the total amount of pills that you were prescribed.  With controlled substances the law allows 72 hours for the pharmacy to complete the prescription.  If the prescription is not completed within 72 hours,  the pharmacist will require a new prescription to be written. This means that you will be short on your medicine and we WILL NOT send another prescription to complete your original prescription.  Instead, request the pharmacy to send a carrier to a nearby branch to get enough medication to provide you with your full prescription. Prescription Accuracy: You are responsible for carefully inspecting your prescriptions before leaving our office. Have the discharge nurse carefully go over each prescription with you, before taking them home. Make sure that your name is accurately spelled, that your address is correct. Check the name and dose of your medication to make  sure it is accurate. Check the number of pills, and the written instructions to make sure they are clear and accurate. Make sure that you are given enough medication to last until your next medication refill appointment. Taking Medication: Take medication as prescribed. When it comes to controlled substances, taking less pills or less frequently than prescribed is permitted and encouraged. Never take more pills than instructed. Never take the medication more frequently than prescribed.  Inform other Doctors: Always inform, all of your healthcare providers, of all the medications you take. Pain Medication from other Providers: You are not allowed to accept any additional pain medication from any other Doctor or Healthcare provider. There are two exceptions to this rule. (see below) In the event that you require additional pain medication, you are responsible for notifying us, as stated below. Cough Medicine: Often these contain an opioid, such as codeine or hydrocodone. Never accept or take cough medicine containing these opioids if you are already taking an opioid* medication. The combination may cause respiratory failure and death. Medication Agreement: You are responsible for carefully reading and following our Medication Agreement. This must be signed before receiving any prescriptions from our practice. Safely store a copy of your signed Agreement. Violations to the Agreement will result in no further prescriptions. (Additional copies of our Medication Agreement are available upon request.) Laws, Rules, & Regulations: All patients are expected to follow all [address] and [employer], Rules, [employer]. Ignorance of the Laws does not constitute a valid excuse.  Illegal drugs and Controlled Substances: The use of illegal substances (including, but not limited to marijuana and its derivatives) and/or the illegal use of any controlled substances is strictly prohibited. Violation of this rule may  result in the immediate and permanent discontinuation of any and all prescriptions being written by our practice. The use of any illegal substances is prohibited. Adopted CDC guidelines & recommendations: Target dosing levels will be at or below 60 MME/day. Use of benzodiazepines** is not recommended. Urine Drug testing: Patients taking controlled substances will be required to provide a urine sample upon request. Do not void before coming to your medication management appointments. Hold emptying your bladder until you are admitted. The admitting nurse will inform you if a sample is required. Our practice reserves the right to call you at any time to provide a sample. Once receiving the call, you have 24 hours to comply with request. Not providing a sample upon request may result in termination of medication therapy.  Exceptions: There are only two exceptions to the rule of not receiving pain medications from other Healthcare Providers. Exception #1 (Emergencies): In the event of an emergency (i.e.: accident requiring emergency care), you are allowed to receive additional pain medication. However, you are responsible for: As soon as you are able, call our office [phone], at any time of the day  or night, and leave a message stating your name, the date and nature of the emergency, and the name and dose of the medication prescribed. In the event that your call is answered by a member of our staff, make sure to document and save the date, time, and the name of the person that took your information.  Exception #2 (Planned Surgery): In the event that you are scheduled by another doctor or dentist to have any type of surgery or procedure, you are allowed (for a period no longer than 30 days), to receive additional pain medication, for the acute post-op pain. However, in this case, you are responsible for picking up a copy of our "Post-op Pain Management for Surgeons" handout, and giving it to your surgeon or  dentist. This document is available at our office, and does not require an appointment to obtain it. Simply go to our office during business hours (Monday-Thursday from 8:00 AM to 4:00 PM) (Friday 8:00 AM to 12:00 Noon) or if you have a scheduled appointment with [place], prior to your surgery, and ask for it by name. In addition, you are responsible for: calling our office [phone], at any time of the day or night, and leaving a message stating your name, name of your surgeon, type of surgery, and date of procedure or surgery. Failure to comply with your responsibilities may result in termination of therapy involving the controlled substances.  Consequences:  Non-compliance with the above rules may result in permanent discontinuation of medication prescription therapy. All patients receiving any type of controlled substance is expected to comply with the above patient responsibilities. Not doing so may result in permanent discontinuation of medication prescription therapy. Medication Agreement Violation. Following the above rules, including your responsibilities will help you in avoiding a Medication Agreement Violation ("Breaking your Pain Medication Contract").  *Opioid medications include: morphine, codeine, oxycodone, oxymorphone, hydrocodone, hydromorphone, meperidine, tramadol, tapentadol, buprenorphine, fentanyl, methadone. **Benzodiazepine medications include: diazepam (Valium), alprazolam (Xanax), clonazepam (Klonopine), lorazepam (Ativan), clorazepate (Tranxene), chlordiazepoxide (Librium), estazolam (Prosom), oxazepam (Serax), temazepam (Restoril), triazolam (Halcion) (Last updated: 08/24/2023) ______________________________________________________________________      ______________________________________________________________________    Medication Recommendations and Reminders  Applies to: All patients receiving prescriptions (written and/or electronic).  Medication Rules &  Regulations: You are responsible for reading, knowing, and following our "Medication Rules" document. These exist for your safety and that of others. They are not flexible and neither are we. Dismissing or ignoring them is an act of "non-compliance" that may result in complete and irreversible termination of such medication therapy. For safety reasons, "non-compliance" will not be tolerated. As with the U.S. fundamental legal principle of "ignorance of the law is no defense", we will accept no excuses for not having read and knowing the content of documents provided to you by our practice.  Pharmacy of record:  Definition: This is the pharmacy where your electronic prescriptions will be sent.  We do not endorse any particular pharmacy. It is up to you and your insurance to decide what pharmacy to use.  We do not restrict you in your choice of pharmacy. However, once we write for your prescriptions, we will NOT be re-sending more prescriptions to fix restricted supply problems created by your pharmacy, or your insurance.  The pharmacy listed in the electronic medical record should be the one where you want electronic prescriptions to be sent. If you choose to change pharmacy, simply notify our nursing staff. Changes will be made only during your regular appointments and not over  the phone.  Recommendations: Keep all of your pain medications in a safe place, under lock and key, even if you live alone. We will NOT replace lost, stolen, or damaged medication. We do not accept "Police Reports" as proof of medications having been stolen. After you fill your prescription, take 1 week's worth of pills and put them away in a safe place. You should keep a separate, properly labeled bottle for this purpose. The remainder should be kept in the original bottle. Use this as your primary supply, until it runs out. Once it's gone, then you know that you have 1 week's worth of medicine, and it is time to come in for a  prescription refill. If you do this correctly, it is unlikely that you will ever run out of medicine. To make sure that the above recommendation works, it is very important that you make sure your medication refill appointments are scheduled at least 1 week before you run out of medicine. To do this in an effective manner, make sure that you do not leave the office without scheduling your next medication management appointment. Always ask the nursing staff to show you in your prescription , when your medication will be running out. Then arrange for the receptionist to get you a return appointment, at least 7 days before you run out of medicine. Do not wait until you have 1 or 2 pills left, to come in. This is very poor planning and does not take into consideration that we may need to cancel appointments due to bad weather, sickness, or emergencies affecting our staff. DO NOT ACCEPT A "Partial Fill": If for any reason your pharmacy does not have enough pills/tablets to completely fill or refill your prescription, do not allow for a "partial fill". The law allows the pharmacy to complete that prescription within 72 hours, without requiring a new prescription. If they do not fill the rest of your prescription within those 72 hours, you will need a separate prescription to fill the remaining amount, which we will NOT provide. If the reason for the partial fill is your insurance, you will need to talk to the pharmacist about payment alternatives for the remaining tablets, but again, DO NOT ACCEPT A PARTIAL FILL, unless you can trust your pharmacist to obtain the remainder of the pills within 72 hours.  Prescription refills and/or changes in medication(s):  Prescription refills, and/or changes in dose or medication, will be conducted only during scheduled medication management appointments. (Applies to both, written and electronic prescriptions.) No refills on procedure days. No medication will be changed or started  on procedure days. No changes, adjustments, and/or refills will be conducted on a procedure day. Doing so will interfere with the diagnostic portion of the procedure. No phone refills. No medications will be "called into the pharmacy". No Fax refills. No weekend refills. No Holliday refills. No after hours refills.  Remember:  Business hours are:  Monday to Thursday 8:00 AM to 4:00 PM Provider's Schedule: Delano Metz, MD - Appointments are:  Medication management: Monday and Wednesday 8:00 AM to 4:00 PM Procedure day: Tuesday and Thursday 7:30 AM to 4:00 PM Edward Jolly, MD - Appointments are:  Medication management: Tuesday and Thursday 8:00 AM to 4:00 PM Procedure day: Monday and Wednesday 7:30 AM to 4:00 PM (Last update: 08/24/2022) ______________________________________________________________________      ______________________________________________________________________     Naloxone Nasal Spray  Why am I receiving this medication? [place] STOP ACT requires that all patients taking high  dose opioids or at risk of opioids respiratory depression, be prescribed an opioid reversal agent, such as Naloxone (AKA: Narcan).  What is this medication? NALOXONE (nal OX one) treats opioid overdose, which causes slow or shallow breathing, severe drowsiness, or trouble staying awake. Call emergency services after using this medication. You may need additional treatment. Naloxone works by reversing the effects of opioids. It belongs to a group of medications called opioid blockers.  COMMON BRAND NAME(S): Kloxxado, Narcan  What should I tell my care team before I take this medication? They need to know if you have any of these conditions: Heart disease Substance use disorder An unusual or allergic reaction to naloxone, other medications, foods, dyes, or preservatives Pregnant or trying to get pregnant Breast-feeding  When to use this medication? This medication is to  be used for the treatment of respiratory depression (less than 8 breaths per minute) secondary to opioid overdose.   How to use this medication? This medication is for use in the nose. Lay the person on their back. Support their neck with your hand and allow the head to tilt back before giving the medication. The nasal spray should be given into 1 nostril. After giving the medication, move the person onto their side. Do not remove or test the nasal spray until ready to use. Get emergency medical help right away after giving the first dose of this medication, even if the person wakes up. You should be familiar with how to recognize the signs and symptoms of a narcotic overdose. If more doses are needed, give the additional dose in the other nostril. Talk to your care team about the use of this medication in children. While this medication may be prescribed for children as young as newborns for selected conditions, precautions do apply.  Naloxone Overdosage: If you think you have taken too much of this medicine contact a poison control center or emergency room at once.  NOTE: This medicine is only for you. Do not share this medicine with others.  What if I miss a dose? This does not apply.  What may interact with this medication? This is only used during an emergency. No interactions are expected during emergency use. This list may not describe all possible interactions. Give your health care provider a list of all the medicines, herbs, non-prescription drugs, or dietary supplements you use. Also tell them if you smoke, drink alcohol, or use illegal drugs. Some items may interact with your medicine.  What should I watch for while using this medication? Keep this medication ready for use in the case of an opioid overdose. Make sure that you have the phone number of your care team and local hospital ready. You may need to have additional doses of this medication. Each nasal spray contains a single  dose. Some emergencies may require additional doses. After use, bring the treated person to the nearest hospital or call 911. Make sure the treating care team knows that the person has received a dose of this medication. You will receive additional instructions on what to do during and after use of this medication before an emergency occurs.  What side effects may I notice from receiving this medication? Side effects that you should report to your care team as soon as possible: Allergic reactions--skin rash, itching, hives, swelling of the face, lips, tongue, or throat Side effects that usually do not require medical attention (report these to your care team if they continue or are bothersome): Constipation Dryness or irritation  inside the nose Headache Increase in blood pressure Muscle spasms Stuffy nose Toothache This list may not describe all possible side effects. Call your doctor for medical advice about side effects. You may report side effects to FDA at 1-800-FDA-1088.  Where should I keep my medication? Because this is an emergency medication, you should keep it with you at all times.  Keep out of the reach of children and pets. Store between 20 and 25 degrees C (68 and 77 degrees F). Do not freeze. Throw away any unused medication after the expiration date. Keep in original box until ready to use.  NOTE: This sheet is a summary. It may not cover all possible information. If you have questions about this medicine, talk to your doctor, pharmacist, or health care provider.   2023 Elsevier/Gold Standard (2021-06-26 00:00:00)  ______________________________________________________________________

## 2023-11-28 NOTE — Addendum Note (Signed)
Addended by: Delano Metz A on: 11/28/2023 10:51 AM   Modules accepted: Orders

## 2023-12-01 LAB — TOXASSURE SELECT 13 (MW), URINE

## 2023-12-06 ENCOUNTER — Other Ambulatory Visit: Payer: No Typology Code available for payment source

## 2023-12-08 ENCOUNTER — Ambulatory Visit
Admission: RE | Admit: 2023-12-08 | Discharge: 2023-12-08 | Disposition: A | Discharge status: Home / Self Care | Payer: No Typology Code available for payment source | Source: Ambulatory Visit | Attending: Pain Medicine | Admitting: Pain Medicine

## 2023-12-08 DIAGNOSIS — M79605 Pain in left leg: Secondary | ICD-10-CM | POA: Insufficient documentation

## 2023-12-08 DIAGNOSIS — R937 Abnormal findings on diagnostic imaging of other parts of musculoskeletal system: Secondary | ICD-10-CM

## 2023-12-08 DIAGNOSIS — M5417 Radiculopathy, lumbosacral region: Secondary | ICD-10-CM | POA: Diagnosis present

## 2023-12-08 DIAGNOSIS — M47816 Spondylosis without myelopathy or radiculopathy, lumbar region: Secondary | ICD-10-CM

## 2023-12-08 DIAGNOSIS — G8929 Other chronic pain: Secondary | ICD-10-CM

## 2023-12-08 DIAGNOSIS — M4310 Spondylolisthesis, site unspecified: Secondary | ICD-10-CM

## 2023-12-08 DIAGNOSIS — M5459 Other low back pain: Secondary | ICD-10-CM

## 2023-12-08 DIAGNOSIS — M51372 Other intervertebral disc degeneration, lumbosacral region with discogenic back pain and lower extremity pain: Secondary | ICD-10-CM

## 2023-12-08 DIAGNOSIS — M5416 Radiculopathy, lumbar region: Secondary | ICD-10-CM

## 2023-12-08 DIAGNOSIS — M5386 Other specified dorsopathies, lumbar region: Secondary | ICD-10-CM

## 2023-12-08 DIAGNOSIS — M5442 Lumbago with sciatica, left side: Secondary | ICD-10-CM | POA: Insufficient documentation

## 2023-12-19 ENCOUNTER — Ambulatory Visit (INDEPENDENT_AMBULATORY_CARE_PROVIDER_SITE_OTHER): Payer: No Typology Code available for payment source | Admitting: Podiatry

## 2023-12-19 ENCOUNTER — Encounter: Payer: Self-pay | Admitting: Podiatry

## 2023-12-19 DIAGNOSIS — B351 Tinea unguium: Secondary | ICD-10-CM | POA: Diagnosis not present

## 2023-12-19 MED ORDER — ITRACONAZOLE 100 MG PO CAPS: ORAL_CAPSULE | ORAL | 0 refills | Status: DC

## 2023-12-19 NOTE — Progress Notes (Signed)
  Subjective:  Patient ID: Jose Washington, male    DOB: 26-Dec-1965,  MRN: [id number]  Chief Complaint  Patient presents with   Nail Problem    "I have this right here and the fungus.  Depending on what he does, I just want one done today then I will reschedule for the other." (Toenail hallux right)    58 y.o. male presents with the above complaint. History confirmed with patient.  Does not recall damaging toenails but they have been like this for quite some time  Objective:  Physical Exam: warm, good capillary refill, no trophic changes or ulcerative lesions, normal DP and PT pulses, normal sensory exam, and significant dystrophy and onychomycosis of the bilateral hallux nail with onychogryphosis.   Metabolic panel 09/20/2023 creatinine 1.11, ALT 81 AST 48 Assessment:  No diagnosis found.   Plan:  Patient was evaluated and treated and all questions answered.  Discussed treatment of severe nail deformity and onychomycosis.  Discussed that even with nail removal and treatment still may not get back to a normal clear toenail.  Discussed that if this occurs that permanent matricectomy may offer pain relief long-term.  He would like to attempt salvage of the toenails.  I recommend temporary removal of the nail plates, at his request we performed only the right side today and he will return in 2 weeks on the left side done.  I recommended treating with an oral antifungal agent.  He did have elevated ALT and AST in November they are planning to follow-up on this in May of this year with his PCP, considering this recommended pulsed dosing of itraconazole twice daily for 1 week and then repeat every 4 weeks for 3 cycles.  Rx sent to pharmacy we discussed the use and possible side effects.  I will see him in 2 weeks for the removal of the left great toenail.  No follow-ups on file.

## 2023-12-19 NOTE — Patient Instructions (Signed)

## 2023-12-22 ENCOUNTER — Ambulatory Visit: Payer: No Typology Code available for payment source | Admitting: Pain Medicine

## 2023-12-22 ENCOUNTER — Telehealth: Payer: Self-pay

## 2023-12-26 NOTE — Progress Notes (Unsigned)
 PROVIDER NOTE: Interpretation of information contained herein should be left to medically-trained personnel. Specific patient instructions are provided elsewhere under "Patient Instructions" section of medical record. This document was created in part using STT-dictation technology, any transcriptional errors that may result from this process are unintentional.  Patient: Jose Washington Type: Established DOB: 11-13-1965 MRN: [id number] PCP: Larae Grooms, NP  Service: Procedure DOS: 12/27/2023 Setting: Ambulatory Location: Ambulatory outpatient facility Delivery: Face-to-face Provider: Oswaldo Done, MD Specialty: Interventional Pain Management Specialty designation: 09 Location: Outpatient facility Ref. Prov.: Delano Metz, MD       Interventional Therapy   Type: Lumbar epidural steroid injection (LESI) (interlaminar) #1    Laterality: Left   Level:  L5-S1 Level.  Imaging: Fluoroscopic guidance         Anesthesia: Local anesthesia (1-2% Lidocaine) Anxiolysis: None                 Sedation:                         DOS: 12/27/2023  Performed by: Oswaldo Done, MD  Purpose: Diagnostic/Therapeutic Indications: Lumbar radicular pain of intraspinal etiology of more than 4 weeks that has failed to respond to conservative therapy and is severe enough to impact quality of life or function. No diagnosis found. NAS-11 Pain score:   Pre-procedure:  /10   Post-procedure:  /10      Position / Prep / Materials:  Position: Prone w/ head of the table raised (slight reverse trendelenburg) to facilitate breathing.  Prep solution: ChloraPrep (2% chlorhexidine gluconate and 70% isopropyl alcohol) Prep Area: Entire Posterior Lumbar Region from lower scapular tip down to mid buttocks area and from flank to flank. Materials:  Tray: Epidural tray Needle(s):  Type: Epidural needle (Tuohy) Gauge (G):  17 Length: Regular (3.5-in) Qty: 1   H&P (Pre-op Assessment):  [name] is a 58 y.o. (year old), male patient, seen today for interventional treatment. He  has a past surgical history that includes eye muscle repair (1972 and 1975); bLEEDING ULCER (2012); Flexible sigmoidoscopy; Colonoscopy with propofol (N/A, 03/24/2021); and Esophagogastroduodenoscopy (egd) with propofol (N/A, 03/24/2021). Jose Washington has a current medication list which includes the following prescription(s): acetaminophen, prevagen extra strength, atenolol, atorvastatin, baclofen, buspirone, calcium carbonate, vitamin d3, desloratadine-pseudoephedrine, diphenhydramine hcl, docusate sodium, duloxetine, elderberry, fluticasone, glucosamine-chondroit-vit c-mn, hydrocodone-acetaminophen, hydroxyzine, ibuprofen, itraconazole, levalbuterol, magnesium oxide, melatonin, methylsulfonylmethane, multivitamin, omeprazole, ondansetron, potassium, simethicone, sucralfate, cyanocobalamin, and vitamin e. His primarily concern today is the No chief complaint on file.  Initial Vital Signs:  Pulse/HCG Rate:    Temp:   Resp:   BP:   SpO2:    BMI: Estimated body mass index is 25.84 kg/m as calculated from the following:   Height as of 11/28/23: 5\' 7"  (1.702 m).   Weight as of 11/28/23: 165 lb (74.8 kg).  Risk Assessment: Allergies: Reviewed. He has no known allergies.  Allergy Precautions: None required Coagulopathies: Reviewed. None identified.  Blood-thinner therapy: None at this time Active Infection(s): Reviewed. None identified. Jose Washington is afebrile  Site Confirmation: Jose Washington was asked to confirm the procedure and laterality before marking the site Procedure checklist: Completed Consent: Before the procedure and under the influence of no sedative(s), amnesic(s), or anxiolytics, the patient was informed of the treatment options, risks and possible complications. To fulfill our ethical and legal obligations, as recommended by the American Medical Association's Code of Ethics, I have informed the  patient of my clinical impression;  the nature and purpose of the treatment or procedure; the risks, benefits, and possible complications of the intervention; the alternatives, including doing nothing; the risk(s) and benefit(s) of the alternative treatment(s) or procedure(s); and the risk(s) and benefit(s) of doing nothing. The patient was provided information about the general risks and possible complications associated with the procedure. These may include, but are not limited to: failure to achieve desired goals, infection, bleeding, organ or nerve damage, allergic reactions, paralysis, and death. In addition, the patient was informed of those risks and complications associated to Spine-related procedures, such as failure to decrease pain; infection (i.e.: Meningitis, epidural or intraspinal abscess); bleeding (i.e.: epidural hematoma, subarachnoid hemorrhage, or any other type of intraspinal or peri-dural bleeding); organ or nerve damage (i.e.: Any type of peripheral nerve, nerve root, or spinal cord injury) with subsequent damage to sensory, motor, and/or autonomic systems, resulting in permanent pain, numbness, and/or weakness of one or several areas of the body; allergic reactions; (i.e.: anaphylactic reaction); and/or death. Furthermore, the patient was informed of those risks and complications associated with the medications. These include, but are not limited to: allergic reactions (i.e.: anaphylactic or anaphylactoid reaction(s)); adrenal axis suppression; blood sugar elevation that in diabetics may result in ketoacidosis or comma; water retention that in patients with history of congestive heart failure may result in shortness of breath, pulmonary edema, and decompensation with resultant heart failure; weight gain; swelling or edema; medication-induced neural toxicity; particulate matter embolism and blood vessel occlusion with resultant organ, and/or nervous system infarction; and/or aseptic necrosis  of one or more joints. Finally, the patient was informed that Medicine is not an exact science; therefore, there is also the possibility of unforeseen or unpredictable risks and/or possible complications that may result in a catastrophic outcome. The patient indicated having understood very clearly. We have given the patient no guarantees and we have made no promises. Enough time was given to the patient to ask questions, all of which were answered to the patient's satisfaction. Jose Washington has indicated that he wanted to continue with the procedure. Attestation: I, the ordering provider, attest that I have discussed with the patient the benefits, risks, side-effects, alternatives, likelihood of achieving goals, and potential problems during recovery for the procedure that I have provided informed consent. Date  Time: {CHL [facility]-PAIN TIME CHOICES:[id number]}   Pre-Procedure Preparation:  Monitoring: As per clinic protocol. Respiration, ETCO2, SpO2, BP, heart rate and rhythm monitor placed and checked for adequate function Safety Precautions: Patient was assessed for positional comfort and pressure points before starting the procedure. Time-out: I initiated and conducted the "Time-out" before starting the procedure, as per protocol. The patient was asked to participate by confirming the accuracy of the "Time Out" information. Verification of the correct person, site, and procedure were performed and confirmed by me, the nursing staff, and the patient. "Time-out" conducted as per Joint Commission's Universal Protocol (UP.01.01.01). Time:   Start Time:   hrs.  Description/Narrative of Procedure:          Target: Epidural space via interlaminar opening, initially targeting the lower laminar border of the superior vertebral body. Region: Lumbar Approach: Percutaneous paravertebral  Rationale (medical necessity): procedure needed and proper for the diagnosis and/or treatment of the patient's medical  symptoms and needs. Procedural Technique Safety Precautions: Aspiration looking for blood return was conducted prior to all injections. At no point did we inject any substances, as a needle was being advanced. No attempts were made at seeking any paresthesias. Safe injection practices and  needle disposal techniques used. Medications properly checked for expiration dates. SDV (single dose vial) medications used. Description of the Procedure: Protocol guidelines were followed. The procedure needle was introduced through the skin, ipsilateral to the reported pain, and advanced to the target area. Bone was contacted and the needle walked caudad, until the lamina was cleared. The epidural space was identified using "loss-of-resistance technique" with 2-3 ml of PF-NaCl (0.9% NSS), in a 5cc LOR glass syringe.  There were no vitals filed for this visit.  Start Time:   hrs. End Time:   hrs.  Imaging Guidance (Spinal):          Type of Imaging Technique: Fluoroscopy Guidance (Spinal) Indication(s): Fluoroscopy guidance for needle placement to enhance accuracy in procedures requiring precise needle localization for targeted delivery of medication in or near specific anatomical locations not easily accessible without such real-time imaging assistance. Exposure Time: Please see nurses notes. Contrast: Before injecting any contrast, we confirmed that the patient did not have an allergy to iodine, shellfish, or radiological contrast. Once satisfactory needle placement was completed at the desired level, radiological contrast was injected. Contrast injected under live fluoroscopy. No contrast complications. See chart for type and volume of contrast used. Fluoroscopic Guidance: I was personally present during the use of fluoroscopy. "Tunnel Vision Technique" used to obtain the best possible view of the target area. Parallax error corrected before commencing the procedure. "Direction-depth-direction" technique used to  introduce the needle under continuous pulsed fluoroscopy. Once target was reached, antero-posterior, oblique, and lateral fluoroscopic projection used confirm needle placement in all planes. Images permanently stored in EMR. Interpretation: I personally interpreted the imaging intraoperatively. Adequate needle placement confirmed in multiple planes. Appropriate spread of contrast into desired area was observed. No evidence of afferent or efferent intravascular uptake. No intrathecal or subarachnoid spread observed. Permanent images saved into the patient's record.  Antibiotic Prophylaxis:   Anti-infectives (From admission, onward)    None      Indication(s): None identified  Post-operative Assessment:  Post-procedure Vital Signs:  Pulse/HCG Rate:    Temp:   Resp:   BP:   SpO2:    EBL: None  Complications: No immediate post-treatment complications observed by team, or reported by patient.  Note: The patient tolerated the entire procedure well. A repeat set of vitals were taken after the procedure and the patient was kept under observation following institutional policy, for this type of procedure. Post-procedural neurological assessment was performed, showing return to baseline, prior to discharge. The patient was provided with post-procedure discharge instructions, including a section on how to identify potential problems. Should any problems arise concerning this procedure, the patient was given instructions to immediately contact us, at any time, without hesitation. In any case, we plan to contact the patient by telephone for a follow-up status report regarding this interventional procedure.  Comments:  No additional relevant information.  Plan of Care (POC)  Orders:  No orders of the defined types were placed in this encounter.  Chronic Opioid Analgesic:   No chronic opioid analgesics therapy prescribed by our practice. Hydrocodone/APAP 5/325, 1 tab PO QD (5 mg/day of  hydrocodone) MME/day: 5 mg/day.   Medications ordered for procedure: No orders of the defined types were placed in this encounter.  Medications administered: Benjie Karvonen. [place] had no medications administered during this visit.  See the medical record for exact dosing, route, and time of administration.  Follow-up plan:   No follow-ups on file.       Interventional Therapies  Risk Factors  Considerations:   Abnormal UDS (03/27/2020) & (08/20/20) (+) undisclosed carboxy-THC    Planned  Pending:   Therapeutic right lumbar facet RFA #3    Under consideration:   Therapeutic right IA hip joint injection #2  Therapeutic right lumbar facet RFA #3    Completed:   Diagnostic/therapeutic right IA hip inj. x1 (12/09/2020) (100/100/95/90-95)  Therapeutic right lumbar facet RFA x2 (11/26/2021) (100/100/75/75)  Palliative right lumbar facet MBB x2 (06/07/2019) (100/100/100 x20 days/>75 x3 months)  Diagnostic/therapeutic left lumbar facet MBB x1 (06/07/2019) (100/100/100 x20 days/>75 x3 months)  Diagnostic/therapeutic left L4-5 LESI x2 (07/25/2018) (n/a)  Palliative left CESI x3 (02/16/2018) (100/100/100/100)  Diagnostic right cervical facet MBB x2 (08/19/2022) (100/100/100/100) (w/ local anesthetic only)  Diagnostic left cervical facet MBB x1 (01/10/2020) (100/100/100)  Therapeutic right cervical facet RFA x1 (04/12/2023) (100/100/90/90)  Diagnostic/therapeutic right shoulder region TPI/MNB x2 (07/19/2022) (100/100/100/100)    Therapeutic  Palliative (PRN) options:   Therapeutic lumbar facet RFA   Therapeutic/palliative lumbar facet MBB   Therapeutic L4-5 LESI   Therapeutic CESI   Therapeutic right IA hip joint injection #2  Diagnostic cervical facet MBB #2    Pharmacotherapy  Nonopioids transferred 08/20/2020: Baclofen       Recent Visits Date Type Provider Dept  11/28/23 Office Visit Delano Metz, MD [facility]  Showing recent visits within past 90 days and  meeting all other requirements Future Appointments Date Type Provider Dept  12/27/23 Appointment Delano Metz, MD [facility]  Showing future appointments within next 90 days and meeting all other requirements  Disposition: Discharge home  Discharge (Date  Time): 12/27/2023;   hrs.   Primary Care Physician: Larae Grooms, NP Location: [facility] Note by: Oswaldo Done, MD (TTS technology used. I apologize for any typographical errors that were not detected and corrected.) Date: 12/27/2023; Time: 7:41 AM  Disclaimer:  Medicine is not an Visual merchandiser. The only guarantee in medicine is that nothing is guaranteed. It is important to note that the decision to proceed with this intervention was based on the information collected from the patient. The Data and conclusions were drawn from the patient's questionnaire, the interview, and the physical examination. Because the information was provided in large part by the patient, it cannot be guaranteed that it has not been purposely or unconsciously manipulated. Every effort has been made to obtain as much relevant data as possible for this evaluation. It is important to note that the conclusions that lead to this procedure are derived in large part from the available data. Always take into account that the treatment will also be dependent on availability of resources and existing treatment guidelines, considered by other Pain Management Practitioners as being common knowledge and practice, at the time of the intervention. For Medico-Legal purposes, it is also important to point out that variation in procedural techniques and pharmacological choices are the acceptable norm. The indications, contraindications, technique, and results of the above procedure should only be interpreted and judged by a Board-Certified Interventional Pain Specialist with extensive familiarity and expertise in the same exact  procedure and technique.

## 2023-12-26 NOTE — Patient Instructions (Signed)

## 2023-12-27 ENCOUNTER — Ambulatory Visit: Payer: No Typology Code available for payment source | Attending: Pain Medicine | Admitting: Pain Medicine

## 2023-12-27 ENCOUNTER — Encounter: Payer: Self-pay | Admitting: Pain Medicine

## 2023-12-27 ENCOUNTER — Ambulatory Visit
Admission: RE | Admit: 2023-12-27 | Discharge: 2023-12-27 | Disposition: A | Discharge status: Home / Self Care | Payer: No Typology Code available for payment source | Source: Ambulatory Visit | Attending: Pain Medicine | Admitting: Pain Medicine

## 2023-12-27 VITALS — BP 106/97 | HR 59 | Temp 98.3°F | Resp 14 | Ht 67.0 in | Wt 165.0 lb

## 2023-12-27 DIAGNOSIS — M47816 Spondylosis without myelopathy or radiculopathy, lumbar region: Secondary | ICD-10-CM | POA: Diagnosis present

## 2023-12-27 DIAGNOSIS — M5442 Lumbago with sciatica, left side: Secondary | ICD-10-CM | POA: Insufficient documentation

## 2023-12-27 DIAGNOSIS — M79605 Pain in left leg: Secondary | ICD-10-CM | POA: Diagnosis present

## 2023-12-27 DIAGNOSIS — G8929 Other chronic pain: Secondary | ICD-10-CM | POA: Insufficient documentation

## 2023-12-27 DIAGNOSIS — M5386 Other specified dorsopathies, lumbar region: Secondary | ICD-10-CM | POA: Insufficient documentation

## 2023-12-27 DIAGNOSIS — R937 Abnormal findings on diagnostic imaging of other parts of musculoskeletal system: Secondary | ICD-10-CM | POA: Insufficient documentation

## 2023-12-27 DIAGNOSIS — M4310 Spondylolisthesis, site unspecified: Secondary | ICD-10-CM | POA: Insufficient documentation

## 2023-12-27 DIAGNOSIS — M5459 Other low back pain: Secondary | ICD-10-CM | POA: Insufficient documentation

## 2023-12-27 DIAGNOSIS — M51372 Other intervertebral disc degeneration, lumbosacral region with discogenic back pain and lower extremity pain: Secondary | ICD-10-CM | POA: Diagnosis present

## 2023-12-27 DIAGNOSIS — M5417 Radiculopathy, lumbosacral region: Secondary | ICD-10-CM | POA: Diagnosis present

## 2023-12-27 DIAGNOSIS — M5416 Radiculopathy, lumbar region: Secondary | ICD-10-CM | POA: Diagnosis present

## 2023-12-27 MED ORDER — SODIUM CHLORIDE 0.9% FLUSH
2.0000 mL | Freq: Once | INTRAVENOUS | Status: AC
Start: 2023-12-27 — End: 2023-12-27
  Administered 2023-12-27: 2 mL

## 2023-12-27 MED ORDER — IOHEXOL 180 MG/ML  SOLN
10.0000 mL | Freq: Once | INTRAMUSCULAR | Status: AC
Start: 2023-12-27 — End: 2023-12-27
  Administered 2023-12-27: 10 mL via EPIDURAL

## 2023-12-27 MED ORDER — PENTAFLUOROPROP-TETRAFLUOROETH EX AERO: INHALATION_SPRAY | Freq: Once | CUTANEOUS | Status: DC

## 2023-12-27 MED ORDER — TRIAMCINOLONE ACETONIDE 40 MG/ML IJ SUSP
INTRAMUSCULAR | Status: AC
  Filled 2023-12-27: qty 1

## 2023-12-27 MED ORDER — SODIUM CHLORIDE (PF) 0.9 % IJ SOLN
INTRAMUSCULAR | Status: AC
  Filled 2023-12-27: qty 10

## 2023-12-27 MED ORDER — LIDOCAINE HCL 2 % IJ SOLN
20.0000 mL | Freq: Once | INTRAMUSCULAR | Status: AC
  Administered 2023-12-27: 200 mg

## 2023-12-27 MED ORDER — IOHEXOL 180 MG/ML  SOLN
INTRAMUSCULAR | Status: AC
  Filled 2023-12-27: qty 20

## 2023-12-27 MED ORDER — ROPIVACAINE HCL 2 MG/ML IJ SOLN
2.0000 mL | Freq: Once | INTRAMUSCULAR | Status: AC
  Administered 2023-12-27: 2 mL via EPIDURAL

## 2023-12-27 MED ORDER — TRIAMCINOLONE ACETONIDE 40 MG/ML IJ SUSP
40.0000 mg | Freq: Once | INTRAMUSCULAR | Status: AC
  Administered 2023-12-27: 40 mg

## 2023-12-27 MED ORDER — ROPIVACAINE HCL 2 MG/ML IJ SOLN
INTRAMUSCULAR | Status: AC
  Filled 2023-12-27: qty 20

## 2023-12-27 MED ORDER — LIDOCAINE HCL (PF) 2 % IJ SOLN
INTRAMUSCULAR | Status: AC
  Filled 2023-12-27: qty 10

## 2023-12-28 ENCOUNTER — Telehealth: Payer: Self-pay

## 2023-12-28 NOTE — Telephone Encounter (Signed)
 No issues post-procedure.

## 2024-01-11 NOTE — Progress Notes (Unsigned)
 PROVIDER NOTE: Information contained herein reflects review and annotations entered in association with encounter. Interpretation of such information and data should be left to medically-trained personnel. Information provided to patient can be located elsewhere in the medical record under "Patient Instructions". Document created using STT-dictation technology, any transcriptional errors that may result from process are unintentional.    Patient: Colonel Bald  Service Category: E/M  Provider: Oswaldo Done, MD  DOB: [birth year]  DOS: 01/12/2024  Referring Provider: Larae Grooms, NP  MRN: [id number]  Specialty: Interventional Pain Management  PCP: Larae Grooms, NP  Type: Established Patient  Setting: Ambulatory outpatient    Location: Office  Delivery: Face-to-face     HPI  Mr. LEMMIE VANLANEN, a 58 y.o. year old male, is here today because of his Chronic left-sided low back pain with left-sided sciatica [M54.42, G89.29]. Mr. Inabinet primary complain today is No chief complaint on file.  Pertinent problems: Mr. Haag has Chronic neck pain (3ry area of Pain) (Bilateral); Chronic pain syndrome; Chronic low back pain (1ry area of Pain) (Bilateral) (L>R) w/o sciatica ; Chronic upper back pain (4th area of Pain) (Bilateral); Chronic shoulder pain (Bilateral) (L>R); Osteoarthritis of AC (acromioclavicular) joint (Right); Chronic sacroiliac joint pain (Bilateral) (L>R); Muscle spasticity; Cervical DDD (C4-5, C5-6, C6-7 and C7-T1); Cervical foraminal stenosis (Bilateral: C5-6 & C6-7, Left: C4-5 & C7-T1); Cervical radiculitis (Bilateral) (R>L); Cervical facet syndrome (Bilateral) (L>R); Cervical spondylosis; Musculoskeletal neck pain (trapezius); Chronic knee pain (Left); Chronic lower extremity pain (2ry area of Pain) (Left); Grade 1 Retrolisthesis of L3 over L4; Lumbar facet arthropathy (Bilateral); Lumbar facet osteoarthritis; Lumbar facet syndrome (Bilateral) (R>L); Lumbar spondylosis;  Chronic shoulder pain (Right); Suprascapular neuropathy (Right); DDD (degenerative disc disease), lumbosacral; Lumbar facet hypertrophy (Multilevel) (Bilateral); Spondylosis without myelopathy or radiculopathy, lumbosacral region; Inflammatory spondylopathy of lumbosacral region [facility]); Chronic lower extremity pain (Right); Chronic musculoskeletal pain; Abnormal MRI, lumbar spine (09/07/2017); Cervicalgia (Bilateral); Spondylosis without myelopathy or radiculopathy, cervical region; Myofascial pain syndrome of thoracic spine (serratus muscle) (Right); Chronic flank pain (Right); Abnormal result on screening urine test (03/27/20 & 08/20/20); Chronic hip pain (Bilateral); Chronic hip pain (Right); Enthesopathy of hip region (Right); Osteoarthritis of hip (Right); Trigger point of shoulder region (Right); Pain of cervical facet joint; Trigger point with neck pain; Trigger point with back pain; Lumbar facet joint pain; Sciatica associated with disorder of lumbar spine; Left lumbar radiculopathy; Lumbosacral radiculopathy at S1 (Left); and Chronic left-sided low back pain with left-sided sciatica on their pertinent problem list. Pain Assessment: Severity of   is reported as a  /10. Location:    / . Onset:  . Quality:  . Timing:  . Modifying factor(s):  Marland Kitchen Vitals:  vitals were not taken for this visit.  BMI: Estimated body mass index is 25.84 kg/m as calculated from the following:   Height as of 12/27/23: 5\' 7"  (1.702 m).   Weight as of 12/27/23: 165 lb (74.8 kg). Last encounter: 11/28/2023. Last procedure: 12/27/2023.  Reason for encounter: post-procedure evaluation and assessment. ***  Discussed the use of AI scribe software for clinical note transcription with the patient, who gave verbal consent to proceed.  History of Present Illness          Post-procedure evaluation   Type: Lumbar epidural steroid injection (LESI) (interlaminar) #1    Laterality: Left   Level:  L5-S1 Level.  Imaging: Fluoroscopic  guidance Spinal ([id number]) Anesthesia: Local anesthesia (1-2% Lidocaine) Anxiolysis: None  Sedation: No Sedation                       DOS: 12/27/2023  Performed by: Oswaldo Done, MD  Purpose: Diagnostic/Therapeutic Indications: Lumbar radicular pain of intraspinal etiology of more than 4 weeks that has failed to respond to conservative therapy and is severe enough to impact quality of life or function. 1. Chronic left-sided low back pain with left-sided sciatica   2. Chronic lower extremity pain (2ry area of Pain) (Left)   3. Degeneration of intervertebral disc of lumbosacral region with discogenic back pain and lower extremity pain   4. Left lumbar radiculopathy   5. Lumbosacral radiculopathy at S1 (Left)    NAS-11 Pain score:   Pre-procedure: 5 /10   Post-procedure: 0-No pain/10     Effectiveness:  Initial hour after procedure:   ***. Subsequent 4-6 hours post-procedure:   ***. Analgesia past initial 6 hours:   ***. Ongoing improvement:  Analgesic:  *** Function:    ***    ROM:    ***      Pharmacotherapy Assessment  Analgesic: No chronic opioid analgesics therapy prescribed by our practice. Hydrocodone/APAP 5/325, 1 tab PO QD (5 mg/day of hydrocodone) MME/day: 5 mg/day.   Monitoring: [place] PMP: PDMP reviewed during this encounter.       Pharmacotherapy: No side-effects or adverse reactions reported. Compliance: No problems identified. Effectiveness: Clinically acceptable.  No notes on file  No results found for: "CBDTHCR" No results found for: "D8THCCBX" No results found for: "D9THCCBX"  UDS:  Summary  Date Value Ref Range Status  11/28/2023 FINAL  Final    Comment:    ==================================================================== Tox[employer] Select 13 (MW) ==================================================================== Specimen Alert Not Detected result may be consistent with the time of last use noted for this medication. AS  NEEDED (Hydrocodone) ==================================================================== Test                             Result       Flag       Units  Drug Absent but Declared for Prescription Verification   Hydrocodone                    Not Detected UNEXPECTED ng/mg creat ==================================================================== Test                      Result    Flag   Units      Ref Range   Creatinine              288              mg/dL      >=16 ==================================================================== Declared Medications:  The flagging and interpretation on this report are based on the  following declared medications.  Unexpected results may arise from  inaccuracies in the declared medications.   **Note: The testing scope of this panel includes these medications:   Hydrocodone (Norco)   **Note: The testing scope of this panel does not include the  following reported medications:   Acetaminophen  Acetaminophen (Norco)  Atenolol (Tenormin)  Atorvastatin (Lipitor)  Baclofen (Lioresal)  Buspirone (Buspar)  Calcium  Desloratadine (Clarinex)  Diphenhydramine  Docusate (Colace)  Duloxetine (Cymbalta)  Fluticasone (Flonase)  Hydroxyzine  Ibuprofen (Advil)  Levalbuterol (Xopenex)  Magnesium (Mag-Ox)  Melatonin  Methylsulfonylmethane  Multivitamin  Omeprazole (Prilosec)  Ondansetron (Zofran)  Potassium  Simethicone (Mylicon)  Sucralfate (Carafate)  Supplement (Prevagen)  Vitamin B12  Vitamin D3  Vitamin E ==================================================================== For clinical consultation, please call [phone]. ====================================================================       ROS  Constitutional: Denies any fever or chills Gastrointestinal: No reported hemesis, hematochezia, vomiting, or acute GI distress Musculoskeletal: Denies any acute onset joint swelling, redness, loss of ROM, or weakness Neurological:  No reported episodes of acute onset apraxia, aphasia, dysarthria, agnosia, amnesia, paralysis, loss of coordination, or loss of consciousness  Medication Review  Apoaequorin, Calcium Carbonate, DULoxetine, Elderberry, Glucosamine-Chondroit-Vit C-Mn, HYDROcodone-acetaminophen, Methylsulfonylmethane, Potassium, Vitamin D3, acetaminophen, atenolol, atorvastatin, baclofen, busPIRone, cyanocobalamin, desloratadine-pseudoephedrine, diphenhydrAMINE HCl, docusate sodium, fluticasone, hydrOXYzine, ibuprofen, itraconazole, levalbuterol, magnesium oxide, melatonin, multivitamin, omeprazole, ondansetron, simethicone, sucralfate, and vitamin E  History Review  Allergy: Mr. Hisaw has no known allergies. Drug: Mr. Sharps  reports no history of drug use. Alcohol:  reports no history of alcohol use. Tobacco:  reports that he has never smoked. He has never used smokeless tobacco. Social: Mr. Haywood  reports that he has never smoked. He has never used smokeless tobacco. He reports that he does not drink alcohol and does not use drugs. Medical:  has a past medical history of Allergy, Anxiety, Chronic duodenal ulcer with hemorrhage (2012), Chronic neck pain, Depression, GERD (gastroesophageal reflux disease), Hyperlipidemia, Hypertension, and Microscopic hematuria. Surgical: Mr. Carroll  has a past surgical history that includes eye muscle repair (1972 and 1975); bLEEDING ULCER (2012); Flexible sigmoidoscopy; Colonoscopy with propofol (N/A, 03/24/2021); and Esophagogastroduodenoscopy (egd) with propofol (N/A, 03/24/2021). Family: family history includes Allergies in his brother; Cancer in his maternal grandfather and mother; Dementia in his father and paternal grandfather; Depression in his father; Diabetes in his mother; Mental illness in his father; Stroke in his paternal grandfather.  Laboratory Chemistry Profile   Renal Lab Results  Component Value Date   BUN 14 09/20/2023   CREATININE 1.11 09/20/2023   BCR  13 09/20/2023   GFRAA 84 12/24/2020   GFRNONAA >60 02/08/2021    Hepatic Lab Results  Component Value Date   AST 48 (H) 09/20/2023   ALT 81 (H) 09/20/2023   ALBUMIN 4.8 09/20/2023   ALKPHOS 77 09/20/2023   LIPASE 28 02/08/2021    Electrolytes Lab Results  Component Value Date   NA 137 09/20/2023   K 4.6 09/20/2023   CL 99 09/20/2023   CALCIUM 10.7 (H) 09/20/2023   MG 2.0 11/12/2016    Bone Lab Results  Component Value Date   VD25OH 72.4 09/20/2023   25OHVITD1 23 (L) 11/12/2016   25OHVITD2 <1.0 11/12/2016   25OHVITD3 23 11/12/2016    Inflammation (CRP: Acute Phase) (ESR: Chronic Phase) Lab Results  Component Value Date   CRP 1.4 (H) 11/12/2016   ESRSEDRATE 3 11/12/2016         Note: Above Lab results reviewed.  Recent Imaging Review  DG PAIN CLINIC C-ARM 1-60 MIN NO REPORT Fluoro was used, but no Radiologist interpretation will be provided.  Please refer to "NOTES" tab for provider progress note. Note: Reviewed        Physical Exam  General appearance: Well nourished, well developed, and well hydrated. In no apparent acute distress Mental status: Alert, oriented x 3 (person, place, & time)       Respiratory: No evidence of acute respiratory distress Eyes: PERLA Vitals: There were no vitals taken for this visit. BMI: Estimated body mass index is 25.84 kg/m as calculated from the following:   Height as of 12/27/23: 5\' 7"  (1.702 m).   Weight as of  12/27/23: 165 lb (74.8 kg). Ideal: Patient weight not recorded  Assessment   Diagnosis Status  1. Chronic left-sided low back pain with left-sided sciatica   2. Chronic lower extremity pain (2ry area of Pain) (Left)   3. Left lumbar radiculopathy   4. Lumbosacral radiculopathy at S1 (Left)   5. Lumbar facet joint pain   6. Lumbar facet syndrome (Bilateral) (R>L)   7. Postop check    Controlled Controlled Controlled   Updated Problems: No problems updated.  Plan of Care  Problem-specific:  Assessment and  Plan            Mr. LINAS STEPTER has a current medication list which includes the following long-term medication(s): atenolol, atorvastatin, baclofen, calcium carbonate, diphenhydramine hcl, duloxetine, hydrocodone-acetaminophen, levalbuterol, omeprazole, potassium, simethicone, and sucralfate.  Pharmacotherapy (Medications Ordered): No orders of the defined types were placed in this encounter.  Orders:  No orders of the defined types were placed in this encounter.  Follow-up plan:   No follow-ups on file.      Interventional Therapies  Risk Factors  Considerations:   Abnormal UDS (03/27/2020) & (08/20/20) (+) undisclosed carboxy-THC    Planned  Pending:   Therapeutic right lumbar facet RFA #3    Under consideration:   Therapeutic right IA hip joint injection #2  Therapeutic right lumbar facet RFA #3    Completed:   Diagnostic/therapeutic right IA hip inj. x1 (12/09/2020) (100/100/95/90-95)  Therapeutic right lumbar facet RFA x2 (11/26/2021) (100/100/75/75)  Palliative right lumbar facet MBB x2 (06/07/2019) (100/100/100 x20 days/>75 x3 months)  Diagnostic/therapeutic left lumbar facet MBB x1 (06/07/2019) (100/100/100 x20 days/>75 x3 months)  Diagnostic/therapeutic left L4-5 LESI x2 (07/25/2018) (n/a)  Palliative left CESI x3 (02/16/2018) (100/100/100/100)  Diagnostic right cervical facet MBB x2 (08/19/2022) (100/100/100/100) (w/ local anesthetic only)  Diagnostic left cervical facet MBB x1 (01/10/2020) (100/100/100)  Therapeutic right cervical facet RFA x1 (04/12/2023) (100/100/90/90)  Diagnostic/therapeutic right shoulder region TPI/MNB x2 (07/19/2022) (100/100/100/100)    Therapeutic  Palliative (PRN) options:   Therapeutic lumbar facet RFA   Therapeutic/palliative lumbar facet MBB   Therapeutic L4-5 LESI   Therapeutic CESI   Therapeutic right IA hip joint injection #2  Diagnostic cervical facet MBB #2    Pharmacotherapy  Nonopioids transferred 08/20/2020:  Baclofen       Recent Visits Date Type Provider Dept  12/27/23 Procedure visit Delano Metz, MD [facility]  11/28/23 Office Visit Delano Metz, MD [facility]  Showing recent visits within past 90 days and meeting all other requirements Future Appointments Date Type Provider Dept  01/12/24 Appointment Delano Metz, MD [facility]  Showing future appointments within next 90 days and meeting all other requirements  I discussed the assessment and treatment plan with the patient. The patient was provided an opportunity to ask questions and all were answered. The patient agreed with the plan and demonstrated an understanding of the instructions.  Patient advised to call back or seek an in-person evaluation if the symptoms or condition worsens.  Duration of encounter: *** minutes.  Total time on encounter, as per AMA guidelines included both the face-to-face and non-face-to-face time personally spent by the physician and/or other qualified health care professional(s) on the day of the encounter (includes time in activities that require the physician or other qualified health care professional and does not include time in activities normally performed by clinical staff). Physician's time may include the following activities when performed: Preparing to see the patient (e.g., pre-charting review of  records, searching for previously ordered imaging, lab work, and nerve conduction tests) Review of prior analgesic pharmacotherapies. Reviewing PMP Interpreting ordered tests (e.g., lab work, imaging, nerve conduction tests) Performing post-procedure evaluations, including interpretation of diagnostic procedures Obtaining and/or reviewing separately obtained history Performing a medically appropriate examination and/or evaluation Counseling and educating the patient/family/caregiver Ordering medications, tests, or procedures Referring and communicating  with other health care professionals (when not separately reported) Documenting clinical information in the electronic or other health record Independently interpreting results (not separately reported) and communicating results to the patient/ family/caregiver Care coordination (not separately reported)  Note by: Oswaldo Done, MD Date: 01/12/2024; Time: 10:49 AM

## 2024-01-12 ENCOUNTER — Ambulatory Visit: Payer: No Typology Code available for payment source | Attending: Pain Medicine | Admitting: Pain Medicine

## 2024-01-12 ENCOUNTER — Encounter: Payer: Self-pay | Admitting: Pain Medicine

## 2024-01-12 VITALS — BP 145/92 | HR 71 | Temp 97.4°F | Resp 16 | Ht 67.0 in | Wt 165.0 lb

## 2024-01-12 DIAGNOSIS — M4726 Other spondylosis with radiculopathy, lumbar region: Secondary | ICD-10-CM

## 2024-01-12 DIAGNOSIS — M5459 Other low back pain: Secondary | ICD-10-CM | POA: Insufficient documentation

## 2024-01-12 DIAGNOSIS — Z09 Encounter for follow-up examination after completed treatment for conditions other than malignant neoplasm: Secondary | ICD-10-CM | POA: Insufficient documentation

## 2024-01-12 DIAGNOSIS — M79605 Pain in left leg: Secondary | ICD-10-CM | POA: Insufficient documentation

## 2024-01-12 DIAGNOSIS — M5442 Lumbago with sciatica, left side: Secondary | ICD-10-CM | POA: Insufficient documentation

## 2024-01-12 DIAGNOSIS — M5417 Radiculopathy, lumbosacral region: Secondary | ICD-10-CM | POA: Insufficient documentation

## 2024-01-12 DIAGNOSIS — M5416 Radiculopathy, lumbar region: Secondary | ICD-10-CM | POA: Insufficient documentation

## 2024-01-12 DIAGNOSIS — G8929 Other chronic pain: Secondary | ICD-10-CM | POA: Diagnosis present

## 2024-01-12 DIAGNOSIS — M47816 Spondylosis without myelopathy or radiculopathy, lumbar region: Secondary | ICD-10-CM | POA: Diagnosis present

## 2024-01-12 NOTE — Progress Notes (Signed)
 Safety precautions to be maintained throughout the outpatient stay will include: orient to surroundings, keep bed in low position, maintain call bell within reach at all times, provide assistance with transfer out of bed and ambulation.

## 2024-02-21 ENCOUNTER — Other Ambulatory Visit: Payer: Self-pay | Admitting: Nurse Practitioner

## 2024-02-21 NOTE — Telephone Encounter (Signed)
 Requested medications are due for refill today.  yes  Requested medications are on the active medications list.  yes  Last refill. 09/20/2023 #180 0 rf  Future visit scheduled.   yes  Notes to clinic.  Refill not delegated.    Requested Prescriptions  Pending Prescriptions Disp Refills   ondansetron  (ZOFRAN ) 8 MG tablet [Pharmacy Med Name: ONDANSETRON  HCL 8 MG TABLET] 180 tablet 0    Sig: Take 1 tablet (8 mg total) by mouth 2 (two) times daily.     Not Delegated - Gastroenterology: Antiemetics - ondansetron  Failed - 02/21/2024  4:36 PM      Failed - This refill cannot be delegated      Failed - AST in normal range and within 360 days    AST  Date Value Ref Range Status  09/20/2023 48 (H) 0 - 40 IU/L Final         Failed - ALT in normal range and within 360 days    ALT  Date Value Ref Range Status  09/20/2023 81 (H) 0 - 44 IU/L Final         Failed - Valid encounter within last 6 months    Recent Outpatient Visits   None     Future Appointments             In 1 month Jose Alexanders, NP [facility]

## 2024-02-27 ENCOUNTER — Ambulatory Visit: Admitting: Podiatry

## 2024-03-16 ENCOUNTER — Other Ambulatory Visit: Payer: Self-pay | Admitting: Nurse Practitioner

## 2024-03-19 ENCOUNTER — Encounter: Payer: Self-pay | Admitting: Nurse Practitioner

## 2024-03-19 ENCOUNTER — Encounter: Payer: Self-pay | Admitting: Pain Medicine

## 2024-03-19 ENCOUNTER — Ambulatory Visit: Attending: Pain Medicine | Admitting: Pain Medicine

## 2024-03-19 VITALS — BP 139/75 | HR 75 | Temp 98.2°F | Ht 67.0 in | Wt 170.0 lb

## 2024-03-19 DIAGNOSIS — M549 Dorsalgia, unspecified: Secondary | ICD-10-CM | POA: Diagnosis present

## 2024-03-19 DIAGNOSIS — M7918 Myalgia, other site: Secondary | ICD-10-CM | POA: Diagnosis present

## 2024-03-19 DIAGNOSIS — G8929 Other chronic pain: Secondary | ICD-10-CM | POA: Insufficient documentation

## 2024-03-19 DIAGNOSIS — M25511 Pain in right shoulder: Secondary | ICD-10-CM | POA: Insufficient documentation

## 2024-03-19 DIAGNOSIS — M545 Low back pain, unspecified: Secondary | ICD-10-CM | POA: Insufficient documentation

## 2024-03-19 DIAGNOSIS — M542 Cervicalgia: Secondary | ICD-10-CM | POA: Diagnosis present

## 2024-03-19 DIAGNOSIS — M546 Pain in thoracic spine: Secondary | ICD-10-CM | POA: Diagnosis present

## 2024-03-19 MED ORDER — PENTAFLUOROPROP-TETRAFLUOROETH EX AERO
INHALATION_SPRAY | Freq: Once | CUTANEOUS | Status: AC
Start: 2024-03-19 — End: 2024-03-19
  Administered 2024-03-19: 30 via TOPICAL

## 2024-03-19 MED ORDER — LIDOCAINE HCL 2 % IJ SOLN
4.0000 mL | Freq: Once | INTRAMUSCULAR | Status: AC
  Administered 2024-03-19: 400 mg
  Filled 2024-03-19: qty 20

## 2024-03-19 MED ORDER — ROPIVACAINE HCL 2 MG/ML IJ SOLN
5.0000 mL | Freq: Once | INTRAMUSCULAR | Status: AC
  Administered 2024-03-19: 20 mL
  Filled 2024-03-19: qty 20

## 2024-03-19 MED ORDER — TRIAMCINOLONE ACETONIDE 40 MG/ML IJ SUSP
40.0000 mg | Freq: Once | INTRAMUSCULAR | Status: AC
Start: 2024-03-19 — End: 2024-03-19
  Administered 2024-03-19: 40 mg
  Filled 2024-03-19: qty 1

## 2024-03-19 NOTE — Progress Notes (Signed)
 Safety precautions to be maintained throughout the outpatient stay will include: orient to surroundings, keep bed in low position, maintain call bell within reach at all times, provide assistance with transfer out of bed and ambulation.

## 2024-03-19 NOTE — Patient Instructions (Addendum)
____________________________________________________________________________________________  General Risks and Possible Complications  Patient Responsibilities: It is important that you read this as it is part of your informed consent. It is our duty to inform you of the risks and possible complications associated with treatments offered to you. It is your responsibility as a patient to read this and to ask questions about anything that is not clear or that you believe was not covered in this document.  Patient's Rights: You have the right to refuse treatment. You also have the right to change your mind, even after initially having agreed to have the treatment done. However, under this last option, if you wait until the last second to change your mind, you may be charged for the materials used up to that point.  Introduction: Medicine is not an exact science. Everything in Medicine, including the lack of treatment(s), carries the potential for danger, harm, or loss (which is by definition: Risk). In Medicine, a complication is a secondary problem, condition, or disease that can aggravate an already existing one. All treatments carry the risk of possible complications. The fact that a side effects or complications occurs, does not imply that the treatment was conducted incorrectly. It must be clearly understood that these can happen even when everything is done following the highest safety standards.  No treatment: You can choose not to proceed with the proposed treatment alternative. The "PRO(s)" would include: avoiding the risk of complications associated with the therapy. The "CON(s)" would include: not getting any of the treatment benefits. These benefits fall under one of three categories: diagnostic; therapeutic; and/or palliative. Diagnostic benefits include: getting information which can ultimately lead to improvement of the disease or symptom(s). Therapeutic benefits are those associated with the  successful treatment of the disease. Finally, palliative benefits are those related to the decrease of the primary symptoms, without necessarily curing the condition (example: decreasing the pain from a flare-up of a chronic condition, such as incurable terminal cancer).  General Risks and Complications: These are associated to most interventional treatments. They can occur alone, or in combination. They fall under one of the following six (6) categories: no benefit or worsening of symptoms; bleeding; infection; nerve damage; allergic reactions; and/or death. No benefits or worsening of symptoms: In Medicine there are no guarantees, only probabilities. No healthcare provider can ever guarantee that a medical treatment will work, they can only state the probability that it may. Furthermore, there is always the possibility that the condition may worsen, either directly, or indirectly, as a consequence of the treatment. Bleeding: This is more common if the patient is taking a blood thinner, either prescription or over the counter (example: Goody Powders, Fish oil, Aspirin, Garlic, etc.), or if suffering a condition associated with impaired coagulation (example: Hemophilia, cirrhosis of the liver, low platelet counts, etc.). However, even if you do not have one on these, it can still happen. If you have any of these conditions, or take one of these drugs, make sure to notify your treating physician. Infection: This is more common in patients with a compromised immune system, either due to disease (example: diabetes, cancer, human immunodeficiency virus [HIV], etc.), or due to medications or treatments (example: therapies used to treat cancer and rheumatological diseases). However, even if you do not have one on these, it can still happen. If you have any of these conditions, or take one of these drugs, make sure to notify your treating physician. Nerve Damage: This is more common when the treatment is an invasive    one, but it can also happen with the use of medications, such as those used in the treatment of cancer. The damage can occur to small secondary nerves, or to large primary ones, such as those in the spinal cord and brain. This damage may be temporary or permanent and it may lead to impairments that can range from temporary numbness to permanent paralysis and/or brain death. Allergic Reactions: Any time a substance or material comes in contact with our body, there is the possibility of an allergic reaction. These can range from a mild skin rash (contact dermatitis) to a severe systemic reaction (anaphylactic reaction), which can result in death. Death: In general, any medical intervention can result in death, most of the time due to an unforeseen complication. ____________________________________________________________________________________________ ____________________________________________________________________________________________  Post-Procedure Discharge Instructions  Instructions: Apply ice:  Purpose: This will minimize any swelling and discomfort after procedure.  When: Day of procedure, as soon as you get home. How: Fill a plastic sandwich bag with crushed ice. Cover it with a small towel and apply to injection site. How long: (15 min on, 15 min off) Apply for 15 minutes then remove x 15 minutes.  Repeat sequence on day of procedure, until you go to bed. Apply heat:  Purpose: To treat any soreness and discomfort from the procedure. When: Starting the next day after the procedure. How: Apply heat to procedure site starting the day following the procedure. How long: May continue to repeat daily, until discomfort goes away. Food intake: Start with clear liquids (like water) and advance to regular food, as tolerated.  Physical activities: Keep activities to a minimum for the first 8 hours after the procedure. After that, then as tolerated. Driving: If you have received any sedation,  be responsible and do not drive. You are not allowed to drive for 24 hours after having sedation. Blood thinner: (Applies only to those taking blood thinners) You may restart your blood thinner 6 hours after your procedure. Insulin: (Applies only to Diabetic patients taking insulin) As soon as you can eat, you may resume your normal dosing schedule. Infection prevention: Keep procedure site clean and dry. Shower daily and clean area with soap and water. Post-procedure Pain Diary: Extremely important that this be done correctly and accurately. Recorded information will be used to determine the next step in treatment. For the purpose of accuracy, follow these rules: Evaluate only the area treated. Do not report or include pain from an untreated area. For the purpose of this evaluation, ignore all other areas of pain, except for the treated area. After your procedure, avoid taking a long nap and attempting to complete the pain diary after you wake up. Instead, set your alarm clock to go off every hour, on the hour, for the initial 8 hours after the procedure. Document the duration of the numbing medicine, and the relief you are getting from it. Do not go to sleep and attempt to complete it later. It will not be accurate. If you received sedation, it is likely that you were given a medication that may cause amnesia. Because of this, completing the diary at a later time may cause the information to be inaccurate. This information is needed to plan your care. Follow-up appointment: Keep your post-procedure follow-up evaluation appointment after the procedure (usually 2 weeks for most procedures, 6 weeks for radiofrequencies). DO NOT FORGET to bring you pain diary with you.   Expect: (What should I expect to see with my procedure?) From numbing medicine (AKA: Local  Anesthetics): Numbness or decrease in pain. You may also experience some weakness, which if present, could last for the duration of the local  anesthetic. Onset: Full effect within 15 minutes of injected. Duration: It will depend on the type of local anesthetic used. On the average, 1 to 8 hours.  From steroids (Applies only if steroids were used): Decrease in swelling or inflammation. Once inflammation is improved, relief of the pain will follow. Onset of benefits: Depends on the amount of swelling present. The more swelling, the longer it will take for the benefits to be seen. In some cases, up to 10 days. Duration: Steroids will stay in the system x 2 weeks. Duration of benefits will depend on multiple posibilities including persistent irritating factors. Side-effects: If present, they may typically last 2 weeks (the duration of the steroids). Frequent: Cramps (if they occur, drink Gatorade and take over-the-counter Magnesium 450-500 mg once to twice a day); water retention with temporary weight gain; increases in blood sugar; decreased immune system response; increased appetite. Occasional: Facial flushing (red, warm cheeks); mood swings; menstrual changes. Uncommon: Long-term decrease or suppression of natural hormones; bone thinning. (These are more common with higher doses or more frequent use. This is why we prefer that our patients avoid having any injection therapies in other practices.)  Very Rare: Severe mood changes; psychosis; aseptic necrosis. From procedure: Some discomfort is to be expected once the numbing medicine wears off. This should be minimal if ice and heat are applied as instructed.  Call if: (When should I call?) You experience numbness and weakness that gets worse with time, as opposed to wearing off. New onset bowel or bladder incontinence. (Applies only to procedures done in the spine)  Emergency Numbers: Durning business hours (Monday - Thursday, 8:00 AM - 4:00 PM) (Friday, 9:00 AM - 12:00 Noon): [phone] After hours: [phone] NOTE: If you are having a problem and are unable connect with, or to  talk to a provider, then go to your nearest urgent care or emergency department. If the problem is serious and urgent, please call 911. ____________________________________________________________________________________________

## 2024-03-19 NOTE — Progress Notes (Signed)
 PROVIDER NOTE: Interpretation of information contained herein should be left to medically-trained personnel. Specific patient instructions are provided elsewhere under "Patient Instructions" section of medical record. This document was created in part using STT-dictation technology, any transcriptional errors that may result from this process are unintentional.  Patient: Jose Washington Type: Established DOB: 04-02-1966 MRN: [id number] PCP: Aileen Alexanders, NP  Service: Procedure DOS: 03/19/2024 Setting: Ambulatory Location: Ambulatory outpatient facility Delivery: Face-to-face Provider: Candi Chafe, MD Specialty: Interventional Pain Management Specialty designation: 09 Location: Outpatient facility Ref. Prov.: Aileen Alexanders, NP       Interventional Therapy   Type:  Right trapezius, right longissimus Thoracics muscle, & right erector spinae muscle Trigger Point Injection (Myoneural Block) (1-2 muscle groups)  #3 (w/ steroids)  CPT: 20552 Laterality: Right (-RT)   Imaging: N/A. Landmark-guided",           Anesthesia: Local anesthesia (1-2% Lidocaine ) Anxiolysis: None                 Sedation: No Sedation                       DOS: 03/19/2024  Performed by: Candi Chafe, MD  Medical Necessity (reasoning)  Purpose: Diagnostic/Therapeutic Rationale (medical necessity): procedure needed and proper for the diagnosis and/or treatment of Mr. Oyster medical symptoms and needs. Indications: Right-sided neck pain, right Sub scapular pain, and right-sided low back pain severe enough to impact quality of life and/or function. 1. Trigger point with neck pain   2. Trigger point of shoulder region (Right)   3. Trigger point of thoracic region (Right)   4. Trigger point with back pain   5. Chronic musculoskeletal pain   6. Chronic low back pain (1ry area of Pain) (Bilateral) (L>R) w/o sciatica    7. Chronic shoulder pain (Right)   8. Chronic neck pain (3ry area of Pain)     NAS-11 Pain score:   Pre-procedure: 4 /10   Post-procedure: 4 /10       Muscle: Right trapezius, right longissimus Thoracics muscle, & right erector spinae muscle  Target: Myoneural mass of trigger point. Region: Neck & upper back, mid thoracic and lower lumbar Anatomic Surface: Posterior Anatomic Area: Cervico-thoracic, thoracic, and lumbar Level:    Right neck/shoulder, right infrascapular, and right paravertebral lumbar region     Approach: Percutaneous  Type of procedure: Myoneural injection   Position  Prep  Materials  Position: Sitting. Patient assisted into a comfortable position. Pressure points checked.  Prep solution: ChloraPrep (2% chlorhexidine gluconate and 70% isopropyl alcohol) The target area was identified and the area prepped in the usual manner.  Prep Area: Entire posterior cervicothoracic and thoracolumbar region  Materials:   Tray: Block Needle(s):  Type: Regular  Gauge (G): 25  Length: 1.5-in  Qty: 1  H&P (Pre-op Assessment):  Mr. Rusnak is a 58 y.o. (year old), male patient, seen today for interventional treatment. He  has a past surgical history that includes eye muscle repair (1972 and 1975); bLEEDING ULCER (2012); Flexible sigmoidoscopy; Colonoscopy with propofol  (N/A, 03/24/2021); and Esophagogastroduodenoscopy (egd) with propofol  (N/A, 03/24/2021). Mr. Grove has a current medication list which includes the following prescription(s): acetaminophen , prevagen extra strength, atorvastatin , baclofen , buspirone , calcium  carbonate, vitamin d3, desloratadine-pseudoephedrine, diphenhydramine hcl, docusate sodium, duloxetine , elderberry, fluticasone, glucosamine-chondroit-vit c-mn, ibuprofen, itraconazole , levalbuterol , magnesium oxide, melatonin, methylsulfonylmethane, multivitamin, ondansetron , potassium, simethicone , sucralfate , cyanocobalamin, vitamin e, atenolol , hydrocodone -acetaminophen , hydroxyzine , and omeprazole . His primarily concern today is the Neck  Pain (Right side  of neck and lower right side of back; mid back below shoulder blade)  Initial Vital Signs:  Pulse/HCG Rate: 75  Temp: 98.2 F (36.8 C) Resp:   BP: 139/75 SpO2: 99 %  BMI: Estimated body mass index is 26.63 kg/m as calculated from the following:   Height as of this encounter: 5\' 7"  (1.702 m).   Weight as of this encounter: 170 lb (77.1 kg).  Risk Assessment: Allergies: Reviewed. He has no known allergies.  Allergy Precautions: None required Coagulopathies: Reviewed. None identified.  Blood-thinner therapy: None at this time Active Infection(s): Reviewed. None identified. Mr. Mahaffy is afebrile  Site Confirmation: Mr. Bilyk was asked to confirm the procedure and laterality before marking the site Procedure checklist: Completed Consent: Before the procedure and under the influence of no sedative(s), amnesic(s), or anxiolytics, the patient was informed of the treatment options, risks and possible complications. To fulfill our ethical and legal obligations, as recommended by the American Medical Association's Code of Ethics, I have informed the patient of my clinical impression; the nature and purpose of the treatment or procedure; the risks, benefits, and possible complications of the intervention; the alternatives, including doing nothing; the risk(s) and benefit(s) of the alternative treatment(s) or procedure(s); and the risk(s) and benefit(s) of doing nothing. The patient was provided information about the general risks and possible complications associated with the procedure. These may include, but are not limited to: failure to achieve desired goals, infection, bleeding, organ or nerve damage, allergic reactions, paralysis, and death. In addition, the patient was informed of those risks and complications associated to the procedure, such as failure to decrease pain; infection; bleeding; organ or nerve damage with subsequent damage to sensory, motor, and/or autonomic  systems, resulting in permanent pain, numbness, and/or weakness of one or several areas of the body; allergic reactions; (i.e.: anaphylactic reaction); and/or death. Furthermore, the patient was informed of those risks and complications associated with the medications. These include, but are not limited to: allergic reactions (i.e.: anaphylactic or anaphylactoid reaction(s)); adrenal axis suppression; blood sugar elevation that in diabetics may result in ketoacidosis or comma; water retention that in patients with history of congestive heart failure may result in shortness of breath, pulmonary edema, and decompensation with resultant heart failure; weight gain; swelling or edema; medication-induced neural toxicity; particulate matter embolism and blood vessel occlusion with resultant organ, and/or nervous system infarction; and/or aseptic necrosis of one or more joints. Finally, the patient was informed that Medicine is not an exact science; therefore, there is also the possibility of unforeseen or unpredictable risks and/or possible complications that may result in a catastrophic outcome. The patient indicated having understood very clearly. We have given the patient no guarantees and we have made no promises. Enough time was given to the patient to ask questions, all of which were answered to the patient's satisfaction. Mr. Murcia has indicated that he wanted to continue with the procedure. Attestation: I, the ordering provider, attest that I have discussed with the patient the benefits, risks, side-effects, alternatives, likelihood of achieving goals, and potential problems during recovery for the procedure that I have provided informed consent. Date  Time: 03/19/2024  1:40 PM   Pre-Procedure Preparation:  Monitoring: As per clinic protocol. Respiration, ETCO2, SpO2, BP, heart rate and rhythm monitor placed and checked for adequate function Safety Precautions: Patient was assessed for positional comfort  and pressure points before starting the procedure. Time-out: I initiated and conducted the "Time-out" before starting the procedure, as per protocol. The patient  was asked to participate by confirming the accuracy of the "Time Out" information. Verification of the correct person, site, and procedure were performed and confirmed by me, the nursing staff, and the patient. "Time-out" conducted as per Joint Commission's Universal Protocol (UP.01.01.01). Time: 1414 Start Time: 1414 hrs.   Narrative                Start Time: 1414 hrs.  Standard Safety Precautions: Protocol guidelines were followed. Aspiration was conducted prior to injection. At no point did I inject any substances, as a needle was being advanced. No attempts were made at seeking a paresthesia. Safe injection practices and needle disposal techniques used. Medications properly checked for expiration dates. SDV (single dose vial) medications used.  Local Anesthesia: Skin & deeper tissues infiltrated with local anesthetic. Appropriate amount of time allowed for local anesthetics to take effect.   Technical description:  The target area was identified and the area prepped in the usual manner. The procedure needles were then advanced to the target area. Proper needle placement secured. Negative aspiration confirmed. Solution injected in intermittent fashion, asking for systemic symptoms every 0.5cc of injectate. The needles were then removed and the area cleansed, making sure to leave some of the prepping solution back to take advantage of its long term bactericidal properties.  Vitals:   03/19/24 1339  BP: 139/75  Pulse: 75  Temp: 98.2 F (36.8 C)  SpO2: 99%  Weight: 170 lb (77.1 kg)  Height: 5\' 7"  (1.702 m)     End Time: 1415 hrs.  Imaging Guidance                Type of Imaging Technique: None used Indication(s): N/A Exposure Time: No patient exposure Contrast: None used. Fluoroscopic Guidance: N/A Ultrasound Guidance:  N/A Interpretation: N/A   Post-operative Assessment:  Post-procedure Vital Signs:  Pulse/HCG Rate: 75  Temp: 98.2 F (36.8 C) Resp:   BP: 139/75 SpO2: 99 %  EBL: None  Complications: No immediate post-treatment complications observed by team, or reported by patient.  Note: The patient tolerated the entire procedure well. A repeat set of vitals were taken after the procedure and the patient was kept under observation following institutional policy, for this type of procedure. Post-procedural neurological assessment was performed, showing return to baseline, prior to discharge. The patient was provided with post-procedure discharge instructions, including a section on how to identify potential problems. Should any problems arise concerning this procedure, the patient was given instructions to immediately contact us , at any time, without hesitation. In any case, we plan to contact the patient by telephone for a follow-up status report regarding this interventional procedure.  Comments:  No additional relevant information.   Plan of Care (POC)  Orders:  Orders Placed This Encounter  Procedures   TRIGGER POINT INJECTION    Scheduling Instructions:     Area: Right trapezius, right longissimus Thoracics muscle, & right erector spinae muscle     Side: Right     Sedation: No Sedation.     Timeframe: Today    Where will this procedure be performed?:   [facility]   Informed Consent Details: Physician/Practitioner Attestation; Transcribe to consent form and obtain patient signature    Provider Attestation: I, Merrick Maggio A. Barth Borne, MD, (Pain Management Specialist), the physician/practitioner, attest that I have discussed with the patient the benefits, risks, side effects, alternatives, likelihood of achieving goals and potential problems during recovery for the procedure that I have provided informed consent.  Scheduling Instructions:     Note: Always confirm laterality of pain  with Mr. Mizzell, before procedure.     Transcribe to consent form and obtain patient signature.    Physician/Practitioner attestation of informed consent for procedure/surgical case:   I, the physician/practitioner, attest that I have discussed with the patient the benefits, risks, side effects, alternatives, likelihood of achieving goals and potential problems during recovery for the procedure that I have provided informed consent.    Procedure:   Myoneural Block (Trigger Point injection): Right trapezius, right longissimus Thoracics muscle, & right erector spinae muscle    Physician/Practitioner performing the procedure:   Breland Elders A. Barth Borne MD    Indication/Reason:   Musculoskeletal pain/myofascial pain secondary to trigger point   Provide equipment / supplies at bedside    Procedure tray: "Block Tray" (Disposable  single use) Skin infiltration needle: Regular 1.5-in, 25-G, (x1) Block Needle type: Regular Amount/quantity: 1 Size: Short(1.5-inch) Gauge: 25G    Standing Status:   Standing    Number of Occurrences:   1    Specify:   Block Tray   Chronic Opioid Analgesic:  No chronic opioid analgesics therapy prescribed by our practice. Hydrocodone /APAP 5/325, 1 tab PO QD (5 mg/day of hydrocodone ) MME/day: 5 mg/day.   Medications ordered for procedure: Meds ordered this encounter  Medications   lidocaine  (XYLOCAINE ) 2 % (with pres) injection 80 mg   pentafluoroprop-tetrafluoroeth (GEBAUERS) aerosol   ropivacaine  (PF) 2 mg/mL (0.2%) (NAROPIN ) injection 5 mL   triamcinolone  acetonide (KENALOG -40) injection 40 mg   Medications administered: We administered lidocaine , pentafluoroprop-tetrafluoroeth, ropivacaine  (PF) 2 mg/mL (0.2%), and triamcinolone  acetonide.  See the medical record for exact dosing, route, and time of administration.  Follow-up plan:   Return in about 2 weeks (around 04/02/2024) for (VV), (PPE).       Interventional Therapies  Risk Factors  Considerations:    Abnormal UDS (03/27/2020) & (08/20/20) (+) undisclosed carboxy-THC    Planned  Pending:   Therapeutic Right trapezius, right longissimus Thoracics muscle, & right erector spinae muscle trigger point injections (03/19/2024)    Under consideration:   Therapeutic right IA hip joint injection #2  Therapeutic right lumbar facet RFA #3    Completed:   Diagnostic/therapeutic right IA hip inj. x1 (12/09/2020) (100/100/95/90-95)  Therapeutic right lumbar facet RFA x2 (11/26/2021) (100/100/75/75)  Palliative right lumbar facet MBB x2 (06/07/2019) (100/100/100 x20 days/>75 x3 months)  Diagnostic/therapeutic left lumbar facet MBB x1 (06/07/2019) (100/100/100 x20 days/>75 x3 months)  Diagnostic/therapeutic left L4-5 LESI x2 (07/25/2018) (n/a)  Diagnostic/therapeutic left L5-S1 LESI x1 (12/27/2023) (100/100/100/100) Palliative left CESI x3 (02/16/2018) (100/100/100/100)  Diagnostic right cervical facet MBB x2 (08/19/2022) (100/100/100/100) (w/ local anesthetic only)  Diagnostic left cervical facet MBB x1 (01/10/2020) (100/100/100)  Therapeutic right cervical facet RFA x1 (04/12/2023) (100/100/90/90)  Diagnostic/therapeutic right shoulder region TPI/MNB x2 (07/19/2022) (100/100/100/100)    Therapeutic  Palliative (PRN) options:   Therapeutic lumbar facet RFA   Therapeutic/palliative lumbar facet MBB   Therapeutic L4-5 LESI   Therapeutic CESI   Therapeutic right IA hip joint injection #2  Diagnostic cervical facet MBB #2    Pharmacotherapy  Nonopioids transferred 08/20/2020: Baclofen        Recent Visits Date Type Provider Dept  01/12/24 Office Visit Renaldo Caroli, MD [facility]  12/27/23 Procedure visit Renaldo Caroli, MD [facility]  Showing recent visits within past 90 days and meeting all other requirements Today's Visits Date Type Provider Dept  03/19/24 Office Visit Renaldo Caroli, MD [facility]  Showing today's visits and meeting all other  requirements Future Appointments Date Type Provider Dept  04/09/24 Appointment Renaldo Caroli, MD [facility]  Showing future appointments within next 90 days and meeting all other requirements  Disposition: Discharge home  Discharge (Date  Time): 03/19/2024; 1420 hrs.   Primary Care Physician: Aileen Alexanders, NP Location: [facility] Note by: Candi Chafe, MD (TTS technology used. I apologize for any typographical errors that were not detected and corrected.) Date: 03/19/2024; Time: 3:46 PM  Disclaimer:  Medicine is not an Visual merchandiser. The only guarantee in medicine is that nothing is guaranteed. It is important to note that the decision to proceed with this intervention was based on the information collected from the patient. The Data and conclusions were drawn from the patient's questionnaire, the interview, and the physical examination. Because the information was provided in large part by the patient, it cannot be guaranteed that it has not been purposely or unconsciously manipulated. Every effort has been made to obtain as much relevant data as possible for this evaluation. It is important to note that the conclusions that lead to this procedure are derived in large part from the available data. Always take into account that the treatment will also be dependent on availability of resources and existing treatment guidelines, considered by other Pain Management Practitioners as being common knowledge and practice, at the time of the intervention. For Medico-Legal purposes, it is also important to point out that variation in procedural techniques and pharmacological choices are the acceptable norm. The indications, contraindications, technique, and results of the above procedure should only be interpreted and judged by a Board-Certified Interventional Pain Specialist with extensive familiarity and expertise in the same exact procedure and  technique.

## 2024-03-19 NOTE — Progress Notes (Signed)
 PROVIDER NOTE: Interpretation of information contained herein should be left to medically-trained personnel. Specific patient instructions are provided elsewhere under "Patient Instructions" section of medical record. This document was created in part using AI and STT-dictation technology, any transcriptional errors that may result from this process are unintentional.  Patient: Jose Washington  Service: E/M   PCP: Aileen Alexanders, NP  DOB: [birth year]  DOS: 03/19/2024  Provider: Candi Chafe, MD  MRN: [id number]  Delivery: Face-to-face  Specialty: Interventional Pain Management  Type: Established Patient  Setting: Ambulatory outpatient facility  Specialty designation: 09  Referring Prov.: Aileen Alexanders, NP  Location: Outpatient office facility       HPI  Jose Washington, a 58 y.o. year old male, is here today because of his Trigger point with neck pain [M54.2]. Jose Washington primary complain today is Neck Pain (Right side of neck and lower right side of back; mid back below shoulder blade)  Pertinent problems: Jose Washington has Chronic neck pain (3ry area of Pain); Chronic pain syndrome; Chronic low back pain (1ry area of Pain) (Bilateral) (L>R) w/o sciatica ; Chronic upper back pain (4th area of Pain) (Bilateral); Chronic shoulder pain (Bilateral) (L>R); Osteoarthritis of AC (acromioclavicular) joint (Right); Chronic sacroiliac joint pain (Bilateral) (L>R); Muscle spasticity; Cervical DDD (C4-5, C5-6, C6-7 and C7-T1); Cervical foraminal stenosis (Bilateral: C5-6 & C6-7, Left: C4-5 & C7-T1); Cervical radiculitis (Bilateral) (R>L); Cervical facet syndrome (Bilateral) (L>R); Cervical spondylosis; Musculoskeletal neck pain (trapezius); Chronic knee pain (Left); Chronic lower extremity pain (2ry area of Pain) (Left); Grade 1 Retrolisthesis of L3 over L4; Lumbar facet arthropathy (Bilateral); Lumbar facet osteoarthritis; Lumbar facet syndrome (Bilateral) (R>L); Lumbar spondylosis; Chronic  shoulder pain (Right); Suprascapular neuropathy (Right); DDD (degenerative disc disease), lumbosacral; Lumbar facet hypertrophy (Multilevel) (Bilateral); Spondylosis without myelopathy or radiculopathy, lumbosacral region; Inflammatory spondylopathy of lumbosacral region [facility]); Chronic lower extremity pain (Right); Chronic musculoskeletal pain; Abnormal MRI, lumbar spine (09/07/2017); Cervicalgia (Bilateral); Spondylosis without myelopathy or radiculopathy, cervical region; Myofascial pain syndrome of thoracic spine (serratus muscle) (Right); Chronic flank pain (Right); Abnormal result on screening urine test (03/27/20 & 08/20/20); Chronic hip pain (Bilateral); Chronic hip pain (Right); Enthesopathy of hip region (Right); Osteoarthritis of hip (Right); Trigger point of shoulder region (Right); Pain of cervical facet joint; Trigger point with neck pain; Trigger point with back pain; Lumbar facet joint pain; Sciatica associated with disorder of lumbar spine; Left lumbar radiculopathy; Lumbosacral radiculopathy at S1 (Left); Chronic left-sided low back pain with left-sided sciatica; and Trigger point of thoracic region (Right) on their pertinent problem list. Pain Assessment: Severity of Chronic pain is reported as a 4 /10. Location: Neck Right (right neck; lower right sided back; and right mid back shoulder blade)/denies. Onset: More than a month ago. Quality: Aching, Burning. Timing: Intermittent. Modifying factor(s): sleeping, stretching, PT, meds,, being off work. Vitals:  height is 5\' 7"  (1.702 m) and weight is 170 lb (77.1 kg). His temperature is 98.2 F (36.8 C). His blood pressure is 139/75 and his pulse is 75. His oxygen saturation is 99%.  BMI: Estimated body mass index is 26.63 kg/m as calculated from the following:   Height as of this encounter: 5\' 7"  (1.702 m).   Weight as of this encounter: 170 lb (77.1 kg). Last encounter: 01/12/2024. Last procedure: 12/27/2023.  - Left L5-S1 LESI #1  (100/100/100/100)   Reason for encounter: evaluation of worsening, or previously known (established) problem.  Discussed the use of AI scribe software for clinical note transcription with the patient,  who gave verbal consent to proceed.  History of Present Illness   Jose Washington is a 58 year old male who presents with pain in the right trapezius, right scapula, and right erector spinae muscle.  He experiences persistent pain in the right trapezius, under the right scapula, and in the right erector spinae muscle in the lower back. He has previously received trigger point injections for similar issues, which have been effective in the past.  His sciatica, treated in February, remains well-controlled with no current issues. No critical issues in his spine or facet joints.       Pharmacotherapy Assessment  Analgesic: No chronic opioid analgesics therapy prescribed by our practice. Hydrocodone /APAP 5/325, 1 tab PO QD (5 mg/day of hydrocodone ) MME/day: 5 mg/day.   Monitoring:  PMP: PDMP reviewed during this encounter.       Pharmacotherapy: No side-effects or adverse reactions reported. Compliance: No problems identified. Effectiveness: Clinically acceptable.  Merilyn Staple, RN  03/19/2024  1:39 PM  Sign when Signing Visit Safety precautions to be maintained throughout the outpatient stay will include: orient to surroundings, keep bed in low position, maintain call bell within reach at all times, provide assistance with transfer out of bed and ambulation.     No results found for: "CBDTHCR" No results found for: "D8THCCBX" No results found for: "D9THCCBX"  UDS:  Summary  Date Value Ref Range Status  11/28/2023 FINAL  Final    Comment:    ==================================================================== Tox[employer] Select 13 (MW) ==================================================================== Specimen Alert Not Detected result may be consistent with the time of last  use noted for this medication. AS NEEDED (Hydrocodone ) ==================================================================== Test                             Result       Flag       Units  Drug Absent but Declared for Prescription Verification   Hydrocodone                     Not Detected UNEXPECTED ng/mg creat ==================================================================== Test                      Result    Flag   Units      Ref Range   Creatinine              288              mg/dL      >=16 ==================================================================== Declared Medications:  The flagging and interpretation on this report are based on the  following declared medications.  Unexpected results may arise from  inaccuracies in the declared medications.   **Note: The testing scope of this panel includes these medications:   Hydrocodone  (Norco)   **Note: The testing scope of this panel does not include the  following reported medications:   Acetaminophen   Acetaminophen  (Norco)  Atenolol  (Tenormin )  Atorvastatin  (Lipitor)  Baclofen  (Lioresal )  Buspirone  (Buspar )  Calcium   Desloratadine (Clarinex)  Diphenhydramine  Docusate (Colace)  Duloxetine  (Cymbalta )  Fluticasone (Flonase)  Hydroxyzine   Ibuprofen (Advil)  Levalbuterol  (Xopenex )  Magnesium (Mag-Ox)  Melatonin  Methylsulfonylmethane  Multivitamin  Omeprazole  (Prilosec)  Ondansetron  (Zofran )  Potassium  Simethicone  (Mylicon)  Sucralfate  (Carafate )  Supplement (Prevagen)  Vitamin B12  Vitamin D3  Vitamin E ==================================================================== For clinical consultation, please call [phone]. ====================================================================       ROS  Constitutional: Denies any fever or chills  Gastrointestinal: No reported hemesis, hematochezia, vomiting, or acute GI distress Musculoskeletal: Denies any acute onset joint swelling, redness, loss  of ROM, or weakness Neurological: No reported episodes of acute onset apraxia, aphasia, dysarthria, agnosia, amnesia, paralysis, loss of coordination, or loss of consciousness  Medication Review  Apoaequorin, Calcium  Carbonate, DULoxetine , Elderberry, Glucosamine-Chondroit-Vit C-Mn, HYDROcodone -acetaminophen , Methylsulfonylmethane, Potassium, Vitamin D3, acetaminophen , atenolol , atorvastatin , baclofen , busPIRone , cyanocobalamin, desloratadine-pseudoephedrine, diphenhydrAMINE HCl, docusate sodium, fluticasone, hydrOXYzine , ibuprofen, itraconazole , levalbuterol , magnesium oxide, melatonin, multivitamin, omeprazole , ondansetron , simethicone , sucralfate , and vitamin E  History Review  Allergy: Jose Washington has no known allergies. Drug: Jose Washington  reports no history of drug use. Alcohol:  reports no history of alcohol use. Tobacco:  reports that he has never smoked. He has never used smokeless tobacco. Social: Jose Washington  reports that he has never smoked. He has never used smokeless tobacco. He reports that he does not drink alcohol and does not use drugs. Medical:  has a past medical history of Allergy, Anxiety, Chronic duodenal ulcer with hemorrhage (2012), Chronic neck pain, Depression, GERD (gastroesophageal reflux disease), Hyperlipidemia, Hypertension, and Microscopic hematuria. Surgical: Jose Washington  has a past surgical history that includes eye muscle repair (1972 and 1975); bLEEDING ULCER (2012); Flexible sigmoidoscopy; Colonoscopy with propofol  (N/A, 03/24/2021); and Esophagogastroduodenoscopy (egd) with propofol  (N/A, 03/24/2021). Family: family history includes Allergies in his brother; Cancer in his maternal grandfather and mother; Dementia in his father and paternal grandfather; Depression in his father; Diabetes in his mother; Mental illness in his father; Stroke in his paternal grandfather.  Laboratory Chemistry Profile   Renal Lab Results  Component Value Date   BUN 14 09/20/2023    CREATININE 1.11 09/20/2023   BCR 13 09/20/2023   GFRAA 84 12/24/2020   GFRNONAA >60 02/08/2021    Hepatic Lab Results  Component Value Date   AST 48 (H) 09/20/2023   ALT 81 (H) 09/20/2023   ALBUMIN 4.8 09/20/2023   ALKPHOS 77 09/20/2023   LIPASE 28 02/08/2021    Electrolytes Lab Results  Component Value Date   NA 137 09/20/2023   K 4.6 09/20/2023   CL 99 09/20/2023   CALCIUM  10.7 (H) 09/20/2023   MG 2.0 11/12/2016    Bone Lab Results  Component Value Date   VD25OH 72.4 09/20/2023   25OHVITD1 23 (L) 11/12/2016   25OHVITD2 <1.0 11/12/2016   25OHVITD3 23 11/12/2016    Inflammation (CRP: Acute Phase) (ESR: Chronic Phase) Lab Results  Component Value Date   CRP 1.4 (H) 11/12/2016   ESRSEDRATE 3 11/12/2016         Note: Above Lab results reviewed.  Recent Imaging Review  DG PAIN CLINIC C-ARM 1-60 MIN NO REPORT Fluoro was used, but no Radiologist interpretation will be provided.  Please refer to "NOTES" tab for provider progress note. Note: Reviewed         Physical Exam  General appearance: Well nourished, well developed, and well hydrated. In no apparent acute distress Mental status: Alert, oriented x 3 (person, place, & time)       Respiratory: No evidence of acute respiratory distress Eyes: PERLA Vitals: BP 139/75   Pulse 75   Temp 98.2 F (36.8 C)   Ht 5\' 7"  (1.702 m)   Wt 170 lb (77.1 kg)   SpO2 99%   BMI 26.63 kg/m  BMI: Estimated body mass index is 26.63 kg/m as calculated from the following:   Height as of this encounter: 5\' 7"  (1.702 m).   Weight as of this encounter: 170  lb (77.1 kg). Ideal: Ideal body weight: 66.1 kg (145 lb 11.6 oz) Adjusted ideal body weight: 70.5 kg (155 lb 6.9 oz)  Physical Exam   MUSCULOSKELETAL: Tenderness in the right trapezius, right scapula, and right erector spinae muscles.       Assessment   Diagnosis Status  1. Trigger point with neck pain   2. Trigger point of shoulder region (Right)   3. Trigger point of  thoracic region (Right)   4. Trigger point with back pain   5. Chronic musculoskeletal pain   6. Chronic low back pain (1ry area of Pain) (Bilateral) (L>R) w/o sciatica    7. Chronic shoulder pain (Right)   8. Chronic neck pain (3ry area of Pain)    Having a Flare-up Having a Flare-up Having a Flare-up   Updated Problems: Problem  Trigger point of thoracic region (Right)  Trigger Point With Neck Pain  Trigger Point With Back Pain  Trigger point of shoulder region (Right)  Chronic neck pain (3ry area of Pain)   Pt seems to be doing well with present medications.  Uses Hydrocodone  sparingly.  Sees a chiropractor for regular treatments.       Plan of Care  Problem-specific:  Assessment and Plan    Myalgia   Chronic myalgia affects the right trapezius, right scapular region, and right erector spinae. Previous trigger point injections have been effective. No current sciatica or spine/facet joint issues. Administer trigger point injections to the identified areas of pain.       Jose Washington has a current medication list which includes the following long-term medication(s): atenolol , atorvastatin , baclofen , calcium  carbonate, diphenhydramine hcl, duloxetine , levalbuterol , omeprazole , potassium, simethicone , sucralfate , and hydrocodone -acetaminophen .  Pharmacotherapy (Medications Ordered): Meds ordered this encounter  Medications   lidocaine  (XYLOCAINE ) 2 % (with pres) injection 80 mg   pentafluoroprop-tetrafluoroeth (GEBAUERS) aerosol   ropivacaine  (PF) 2 mg/mL (0.2%) (NAROPIN ) injection 5 mL   triamcinolone  acetonide (KENALOG -40) injection 40 mg   Orders:  Orders Placed This Encounter  Procedures   TRIGGER POINT INJECTION    Scheduling Instructions:     Area: Right trapezius, right longissimus Thoracics muscle, & right erector spinae muscle     Side: Right     Sedation: No Sedation.     Timeframe: Today    Where will this procedure be performed?:   [facility]   Informed Consent Details: Physician/Practitioner Attestation; Transcribe to consent form and obtain patient signature    Provider Attestation: I, Dorin Stooksbury A. Barth Borne, MD, (Pain Management Specialist), the physician/practitioner, attest that I have discussed with the patient the benefits, risks, side effects, alternatives, likelihood of achieving goals and potential problems during recovery for the procedure that I have provided informed consent.    Scheduling Instructions:     Note: Always confirm laterality of pain with Jose Washington, before procedure.     Transcribe to consent form and obtain patient signature.    Physician/Practitioner attestation of informed consent for procedure/surgical case:   I, the physician/practitioner, attest that I have discussed with the patient the benefits, risks, side effects, alternatives, likelihood of achieving goals and potential problems during recovery for the procedure that I have provided informed consent.    Procedure:   Myoneural Block (Trigger Point injection): Right trapezius, right longissimus Thoracics muscle, & right erector spinae muscle    Physician/Practitioner performing the procedure:   Campbell Agramonte A. Barth Borne MD    Indication/Reason:   Musculoskeletal pain/myofascial pain secondary to trigger point   Provide  equipment / supplies at bedside    Procedure tray: "Block Tray" (Disposable  single use) Skin infiltration needle: Regular 1.5-in, 25-G, (x1) Block Needle type: Regular Amount/quantity: 1 Size: Short(1.5-inch) Gauge: 25G    Standing Status:   Standing    Number of Occurrences:   1    Specify:   Block Tray   Follow-up plan:   Return in about 2 weeks (around 04/02/2024) for (VV), (PPE).     Interventional Therapies  Risk Factors  Considerations:   Abnormal UDS (03/27/2020) & (08/20/20) (+) undisclosed carboxy-THC    Planned  Pending:      Under consideration:   Therapeutic right IA hip joint injection #2  Therapeutic  right lumbar facet RFA #3    Completed:  (results)1 Diagnostic/therapeutic right IA hip inj. x1 (12/09/2020) (100/100/95/90-95)  Therapeutic right lumbar facet RFA x2 (11/26/2021) (100/100/75/75)  Palliative right lumbar facet MBB x2 (06/07/2019) (100/100/100 x20 days/>75 x3 months)  Diagnostic/therapeutic left lumbar facet MBB x1 (06/07/2019) (100/100/100 x20 days/>75 x3 months)  Diagnostic/therapeutic left L4-5 LESI x2 (07/25/2018) (n/a)  Diagnostic/therapeutic left L5-S1 LESI x1 (12/27/2023) (100/100/100/100) Palliative left CESI x3 (02/16/2018) (100/100/100/100)  Diagnostic right cervical facet MBB x2 (08/19/2022) (100/100/100/100) (w/ local anesthetic only)  Diagnostic left cervical facet MBB x1 (01/10/2020) (100/100/100)  Therapeutic right cervical facet RFA x1 (04/12/2023) (100/100/90/90)  Diagnostic/therapeutic right shoulder region TPI/MNB x2 (07/19/2022) (100/100/100/100)    Therapeutic  Palliative (PRN) options:   Therapeutic lumbar facet RFA   Therapeutic/palliative lumbar facet MBB   Therapeutic L4-5 LESI   Therapeutic CESI   Therapeutic right IA hip joint injection #2  Diagnostic cervical facet MBB #2    Pharmacotherapy  Nonopioids transferred 08/20/2020: Baclofen       Recent Visits Date Type Provider Dept  01/12/24 Office Visit Renaldo Caroli, MD [facility]  12/27/23 Procedure visit Renaldo Caroli, MD [facility]  Showing recent visits within past 90 days and meeting all other requirements Today's Visits Date Type Provider Dept  03/19/24 Office Visit Renaldo Caroli, MD [facility]  Showing today's visits and meeting all other requirements Future Appointments No visits were found meeting these conditions. Showing future appointments within next 90 days and meeting all other requirements   I discussed the assessment and treatment plan with the patient. The patient was provided an opportunity to ask questions and all were  answered. The patient agreed with the plan and demonstrated an understanding of the instructions.  Patient advised to call back or seek an in-person evaluation if the symptoms or condition worsens.  Duration of encounter: 44 minutes.  Total time on encounter, as per AMA guidelines included both the face-to-face and non-face-to-face time personally spent by the physician and/or other qualified health care professional(s) on the day of the encounter (includes time in activities that require the physician or other qualified health care professional and does not include time in activities normally performed by clinical staff). Physician's time may include the following activities when performed: Preparing to see the patient (e.g., pre-charting review of records, searching for previously ordered imaging, lab work, and nerve conduction tests) Review of prior analgesic pharmacotherapies. Reviewing PMP Interpreting ordered tests (e.g., lab work, imaging, nerve conduction tests) Performing post-procedure evaluations, including interpretation of diagnostic procedures Obtaining and/or reviewing separately obtained history Performing a medically appropriate examination and/or evaluation Counseling and educating the patient/family/caregiver Ordering medications, tests, or procedures Referring and communicating with other health care professionals (when not separately reported) Documenting clinical information in the electronic or other health record  Independently interpreting results (not separately reported) and communicating results to the patient/ family/caregiver Care coordination (not separately reported)  Note by: Candi Chafe, MD (TTS and AI technology used. I apologize for any typographical errors that were not detected and corrected.) Date: 03/19/2024; Time: 2:04 PM

## 2024-03-19 NOTE — Telephone Encounter (Signed)
 Requested Prescriptions  Pending Prescriptions Disp Refills   atenolol  (TENORMIN ) 50 MG tablet [Pharmacy Med Name: ATENOLOL  50 MG TABLET] 90 tablet 0    Sig: TAKE 1 TABLET BY MOUTH EVERY DAY     Cardiovascular: Beta Blockers 2 Failed - 03/19/2024  2:21 PM      Failed - Valid encounter within last 6 months    Recent Outpatient Visits   None     Future Appointments             In 2 weeks Aileen Alexanders, NP [facility]            Passed - Cr in normal range and within 360 days    Creatinine, Ser  Date Value Ref Range Status  09/20/2023 1.11 0.76 - 1.27 mg/dL Final         Passed - Last BP in normal range    BP Readings from Last 1 Encounters:  03/19/24 139/75         Passed - Last Heart Rate in normal range    Pulse Readings from Last 1 Encounters:  03/19/24 75          hydrOXYzine  (ATARAX ) 25 MG tablet [Pharmacy Med Name: HYDROXYZINE  HCL 25 MG TABLET] 270 tablet 0    Sig: TAKE 1 TABLET BY MOUTH THREE TIMES A DAY AS NEEDED     Ear, Nose, and Throat:  Antihistamines 2 Failed - 03/19/2024  2:21 PM      Failed - Valid encounter within last 12 months    Recent Outpatient Visits   None     Future Appointments             In 2 weeks Aileen Alexanders, NP [facility]            Passed - Cr in normal range and within 360 days    Creatinine, Ser  Date Value Ref Range Status  09/20/2023 1.11 0.76 - 1.27 mg/dL Final          omeprazole  (PRILOSEC) 20 MG capsule [Pharmacy Med Name: OMEPRAZOLE  DR 20 MG CAPSULE] 90 capsule 0    Sig: TAKE 1 CAPSULE BY MOUTH DAILY. SCHEDULE OFFICE VISIT     Gastroenterology: Proton Pump Inhibitors Failed - 03/19/2024  2:21 PM      Failed - Valid encounter within last 12 months    Recent Outpatient Visits   None     Future Appointments             In 2 weeks Aileen Alexanders, NP [facility]

## 2024-03-28 ENCOUNTER — Encounter: Payer: Self-pay | Admitting: Podiatry

## 2024-03-28 ENCOUNTER — Ambulatory Visit (INDEPENDENT_AMBULATORY_CARE_PROVIDER_SITE_OTHER): Admitting: Podiatry

## 2024-03-28 DIAGNOSIS — B351 Tinea unguium: Secondary | ICD-10-CM

## 2024-03-28 MED ORDER — ITRACONAZOLE 100 MG PO CAPS: ORAL_CAPSULE | ORAL | 0 refills | Status: DC

## 2024-03-28 NOTE — Patient Instructions (Signed)
 Soak Instructions    THE DAY AFTER THE PROCEDURE  Place 1/4 cup of epsom salts (or betadine, or white vinegar) in a quart of warm tap water.  Submerge your foot or feet with outer bandage intact for the initial soak; this will allow the bandage to become moist and wet for easy lift off.  Once you remove your bandage, continue to soak in the solution for 20 minutes.  This soak should be done twice a day.  Next, remove your foot or feet from solution, blot dry the affected area and apply 2-3 of the Cortisporin drops if they were prescribed for you.  If you did not receive a prescription use regular Neosporin or antibiotic ointment.  Then cover with a regular Band-Aid.  Do this for at least 2 weeks.  Longer if you are still having drainage redness or irritation  IF YOUR SKIN BECOMES IRRITATED WHILE USING THESE INSTRUCTIONS, IT IS OKAY TO SWITCH TO  WHITE VINEGAR AND WATER. Or you may use antibacterial soap and water to keep the toe clean  Monitor for any signs/symptoms of infection. Call the office immediately if any occur or go directly to the emergency room. Call with any questions/concerns.

## 2024-03-29 NOTE — Progress Notes (Signed)
  Subjective:  Patient ID: Jose Washington, male    DOB: Apr 23, 1966,  MRN: [id number]  Chief Complaint  Patient presents with   Nail Problem    "I'm here to have the left big toenail taken off.  The right one is doing fine."    58 y.o. male presents with the above complaint. History confirmed with patient.  Here today to remove the left toenail.  Had no problems taking itraconazole  pulsed dosing  Objective:  Physical Exam: warm, good capillary refill, no trophic changes or ulcerative lesions, normal DP and PT pulses, normal sensory exam, and significant dystrophy and onychomycosis of the left hallux nail with onychogryphosis.   Metabolic panel 09/20/2023 creatinine 1.11, ALT 81 AST 48 Assessment:   1. Onychomycosis      Plan:  Patient was evaluated and treated and all questions answered.  Refill of itraconazole  pulsed dosing sent to pharmacy.  We proceeded today with temporary nail avulsion.  Following consent the left hallux was anesthetized with lidocaine  and Marcaine 1.5 cc each after prep with alcohol.  It was prepared with Betadine exsanguinated and a tourniquet secured around the base of the toe.  A Freer elevator was used to remove the nail plate.  No matricectomy was performed.  A bandage with Silvadene gauze and Coban was applied.  Post care instructions given.  Follow-up as needed after nail regrows if there are further issues.  Return if symptoms worsen or fail to improve.

## 2024-04-03 ENCOUNTER — Encounter: Payer: Self-pay | Admitting: Nurse Practitioner

## 2024-04-03 ENCOUNTER — Ambulatory Visit (INDEPENDENT_AMBULATORY_CARE_PROVIDER_SITE_OTHER): Admitting: Nurse Practitioner

## 2024-04-03 VITALS — BP 111/70 | HR 57 | Ht 67.0 in | Wt 180.0 lb

## 2024-04-03 DIAGNOSIS — F325 Major depressive disorder, single episode, in full remission: Secondary | ICD-10-CM

## 2024-04-03 DIAGNOSIS — G8929 Other chronic pain: Secondary | ICD-10-CM

## 2024-04-03 DIAGNOSIS — I700 Atherosclerosis of aorta: Secondary | ICD-10-CM

## 2024-04-03 DIAGNOSIS — Z0000 Encounter for general adult medical examination without abnormal findings: Secondary | ICD-10-CM

## 2024-04-03 DIAGNOSIS — E785 Hyperlipidemia, unspecified: Secondary | ICD-10-CM | POA: Diagnosis not present

## 2024-04-03 DIAGNOSIS — F419 Anxiety disorder, unspecified: Secondary | ICD-10-CM

## 2024-04-03 DIAGNOSIS — M7918 Myalgia, other site: Secondary | ICD-10-CM | POA: Diagnosis not present

## 2024-04-03 DIAGNOSIS — I10 Essential (primary) hypertension: Secondary | ICD-10-CM

## 2024-04-03 DIAGNOSIS — R7309 Other abnormal glucose: Secondary | ICD-10-CM

## 2024-04-03 MED ORDER — ONDANSETRON HCL 8 MG PO TABS: 8.0000 mg | ORAL_TABLET | Freq: Two times a day (BID) | ORAL | 1 refills | Status: DC

## 2024-04-03 MED ORDER — LEVALBUTEROL TARTRATE 45 MCG/ACT IN AERO: 1.0000 | INHALATION_SPRAY | Freq: Three times a day (TID) | RESPIRATORY_TRACT | 12 refills | Status: AC | PRN

## 2024-04-03 MED ORDER — DULOXETINE HCL 30 MG PO CPEP: 30.0000 mg | ORAL_CAPSULE | Freq: Two times a day (BID) | ORAL | 1 refills | Status: DC

## 2024-04-03 MED ORDER — BACLOFEN 10 MG PO TABS: ORAL_TABLET | ORAL | 1 refills | Status: DC

## 2024-04-03 MED ORDER — SUCRALFATE 1 G PO TABS: 1.0000 g | ORAL_TABLET | Freq: Every day | ORAL | 3 refills | Status: DC

## 2024-04-03 MED ORDER — ATORVASTATIN CALCIUM 40 MG PO TABS: 40.0000 mg | ORAL_TABLET | Freq: Every day | ORAL | 1 refills | Status: DC

## 2024-04-03 MED ORDER — BUSPIRONE HCL 30 MG PO TABS: 30.0000 mg | ORAL_TABLET | Freq: Two times a day (BID) | ORAL | 1 refills | Status: DC

## 2024-04-03 NOTE — Progress Notes (Signed)
 BP 111/70   Pulse (!) 57   Ht 5\' 7"  (1.702 m)   Wt 180 lb (81.6 kg)   BMI 28.19 kg/m    Subjective:    Patient ID: Jose Washington, male    DOB: February 03, 1966, 58 y.o.   MRN: [id number]  HPI: Jose Washington is a 58 y.o. male presenting on 04/03/2024 for comprehensive medical examination. Current medical complaints include: feels like triglycerides will be elevated.  He currently lives with: Interim Problems from his last visit: no  HYPERTENSION / HYPERLIPIDEMIA Satisfied with current treatment? yes Duration of hypertension: years BP monitoring frequency: not checking BP range:  BP medication side effects: no Past BP meds: atenolol  Duration of hyperlipidemia: years Cholesterol medication side effects: no Cholesterol supplements: none Past cholesterol medications: atorvastain (lipitor)  Medication compliance: excellent compliance Aspirin: no Recent stressors: no Recurrent headaches: no Visual changes: no Palpitations: no Dyspnea: no Chest pain: no Lower extremity edema: no Dizzy/lightheaded: no  DEPRESSION/ANXIETY Patient feels like the Duloxetine  is still working well for his mood.  He feels like he is doing great.  Got through a stressful time in December and is doing great.  Also doing Buspar  30mg  BID which is also working well for him.  Denies concerns at visit today. Denies SI.   Depression Screen done today and results listed below:     04/03/2024   10:02 AM 01/12/2024   12:57 PM 12/27/2023    9:40 AM 11/28/2023    9:49 AM 09/20/2023    9:36 AM  Depression screen PHQ 2/9  Decreased Interest 0 0 0 1 1  Down, Depressed, Hopeless 0 0 0 0 0  PHQ - 2 Score 0 0 0 1 1  Altered sleeping 0    1  Tired, decreased energy 0    1  Change in appetite 0    0  Feeling bad or failure about yourself  0    0  Trouble concentrating 0    1  Moving slowly or fidgety/restless 0    0  Suicidal thoughts 0    0  PHQ-9 Score 0    4  Difficult doing work/chores Not difficult at all         The patient does not have a history of falls. I did complete a risk assessment for falls. A plan of care for falls was documented.   Past Medical History:  Past Medical History:  Diagnosis Date   Allergy    Anxiety    Chronic duodenal ulcer with hemorrhage 2012   Chronic neck pain    Depression    GERD (gastroesophageal reflux disease)    Hyperlipidemia    Hypertension    Microscopic hematuria     Surgical History:  Past Surgical History:  Procedure Laterality Date   bLEEDING ULCER  2012   COLONOSCOPY WITH PROPOFOL  N/A 03/24/2021   Procedure: COLONOSCOPY WITH PROPOFOL ;  Surgeon: Marnee Sink, MD;  Location: [facility];  Service: Endoscopy;  Laterality: N/A;   ESOPHAGOGASTRODUODENOSCOPY (EGD) WITH PROPOFOL  N/A 03/24/2021   Procedure: ESOPHAGOGASTRODUODENOSCOPY (EGD) WITH PROPOFOL ;  Surgeon: Marnee Sink, MD;  Location: [facility] END[facility]COPY;  Service: Endoscopy;  Laterality: N/A;   eye muscle repair  1972 and 1975   FLEXIBLE SIGMOIDOSCOPY      Medications:  Current Outpatient Medications on File Prior to Visit  Medication Sig   acetaminophen  (TYLENOL ) 500 MG tablet Take 1 tablet (500 mg total) by mouth every 6 (six) hours as needed.   Apoaequorin (PREVAGEN  EXTRA STRENGTH) 20 MG CAPS Take 20 mg by mouth daily.   atenolol  (TENORMIN ) 50 MG tablet TAKE 1 TABLET BY MOUTH EVERY DAY   Calcium  Carbonate (CALCIUM  600 PO) Take 600 mg by mouth daily.   Cholecalciferol (VITAMIN D3) 1000 units CAPS Take 1 capsule by mouth daily.   desloratadine-pseudoephedrine (CLARINEX-D 12-HOUR) 2.5-120 MG 12 hr tablet Take 1 tablet by mouth as needed.   DiphenhydrAMINE HCl (DIPHEDRYL ALLERGY PO) Take 25 mg by mouth daily.   docusate sodium (COLACE) 100 MG capsule Take 100 mg by mouth daily.   ELDERBERRY PO Take 1,000 mg by mouth 3 (three) times a week.   fluticasone (FLONASE) 50 MCG/ACT nasal spray Place 2 sprays into both nostrils 2 (two) times daily.   Glucosamine-Chondroit-Vit C-Mn (GLUCOSAMINE  1500 COMPLEX PO) Take by mouth 2 (two) times daily.   hydrOXYzine  (ATARAX ) 25 MG tablet TAKE 1 TABLET BY MOUTH THREE TIMES A DAY AS NEEDED   ibuprofen (ADVIL,MOTRIN) 200 MG tablet Take 200 mg by mouth as needed (take 5 times a week).    itraconazole  (SPORANOX ) 100 MG capsule Take twice a day for 7 days, then do not take for 3 weeks. Then repeat twice daily x7 days, then do not take for 3 weeks. Repeat final course twice daily x7 days then stop taking.   magnesium oxide (MAG-OX) 400 MG tablet Take 400 mg by mouth daily.   Melatonin 5 MG TABS Take by mouth at bedtime.    Methylsulfonylmethane (MSM PO) Take by mouth daily.    Multiple Vitamin (MULTIVITAMIN) tablet Take 1 tablet by mouth daily.   omeprazole  (PRILOSEC) 20 MG capsule TAKE 1 CAPSULE BY MOUTH DAILY. SCHEDULE OFFICE VISIT   Potassium 99 MG TABS Take 1 tablet by mouth.   simethicone  (MYLICON) 80 MG chewable tablet Chew 80 mg by mouth every 6 (six) hours as needed for flatulence.   vitamin B-12 (CYANOCOBALAMIN) 1000 MCG tablet Take 1,000 mcg by mouth daily.   vitamin E 180 MG (400 UNITS) capsule Take 400 Units by mouth daily.   HYDROcodone -acetaminophen  (NORCO/VICODIN) 5-325 MG tablet Take 1 tablet by mouth daily as needed for severe pain (pain score 7-10). Must last 30 days. (Patient not taking: Reported on 03/19/2024)   No current facility-administered medications on file prior to visit.    Allergies:  No Known Allergies  Social History:  Social History   Socioeconomic History   Marital status: Single    Spouse name: Not on file   Number of children: Not on file   Years of education: Not on file   Highest education level: Not on file  Occupational History   Not on file  Tobacco Use   Smoking status: Never   Smokeless tobacco: Never  Vaping Use   Vaping status: Never Used  Substance and Sexual Activity   Alcohol use: No    Alcohol/week: 0.0 standard drinks of alcohol   Drug use: No   Sexual activity: Never    Partners:  Female  Other Topics Concern   Not on file  Social History Narrative   Not on file   Social Drivers of Health   Financial Resource Strain: Low Risk  (02/16/2018)   Overall Financial Resource Strain (CARDIA)    Difficulty of Paying Living Expenses: Not hard at all  Food Insecurity: Unknown (02/16/2018)   Hunger Vital Sign    Worried About Running Out of Food in the Last Year: Patient declined    Ran Out of Food in the Last  Year: Patient declined  Transportation Needs: Unknown (02/16/2018)   PRAPARE - Administrator, Civil Service (Medical): Patient declined    Lack of Transportation (Non-Medical): Patient declined  Physical Activity: Unknown (02/16/2018)   Exercise Vital Sign    Days of Exercise per Week: Patient declined    Minutes of Exercise per Session: Patient declined  Stress: No Stress Concern Present (02/16/2018)   [employer] of Occupational Health - Occupational Stress Questionnaire    Feeling of Stress : Not at all  Social Connections: Unknown (02/16/2018)   Social Connection and Isolation Panel [NHANES]    Frequency of Communication with Friends and Family: Patient declined    Frequency of Social Gatherings with Friends and Family: Patient declined    Attends Religious Services: Patient declined    Database administrator or Organizations: Patient declined    Attends Banker Meetings: Patient declined    Marital Status: Patient declined  Intimate Partner Violence: Unknown (02/16/2018)   Humiliation, Afraid, Rape, and Kick questionnaire    Fear of Current or Ex-Partner: Patient declined    Emotionally Abused: Patient declined    Physically Abused: Patient declined    Sexually Abused: Patient declined   Social History   Tobacco Use  Smoking Status Never  Smokeless Tobacco Never   Social History   Substance and Sexual Activity  Alcohol Use No   Alcohol/week: 0.0 standard drinks of alcohol    Family History:  Family History  Problem  Relation Age of Onset   Diabetes Mother    Cancer Mother        breast- in remission   Mental illness Father    Depression Father    Dementia Father        VASCULAR   Allergies Brother    Cancer Maternal Grandfather        colon   Stroke Paternal Grandfather    Dementia Paternal Grandfather        vascular    Past medical history, surgical history, medications, allergies, family history and social history reviewed with patient today and changes made to appropriate areas of the chart.   Review of Systems  Eyes:  Negative for blurred vision and double vision.  Respiratory:  Negative for shortness of breath.   Cardiovascular:  Negative for chest pain, palpitations and leg swelling.  Musculoskeletal:  Positive for myalgias.  Neurological:  Negative for dizziness and headaches.  Psychiatric/Behavioral:  Negative for depression. The patient is not nervous/anxious.    All other ROS negative except what is listed above and in the HPI.      Objective:    BP 111/70   Pulse (!) 57   Ht 5\' 7"  (1.702 m)   Wt 180 lb (81.6 kg)   BMI 28.19 kg/m   Wt Readings from Last 3 Encounters:  04/03/24 180 lb (81.6 kg)  03/19/24 170 lb (77.1 kg)  01/12/24 165 lb (74.8 kg)    Physical Exam Vitals and nursing note reviewed.  Constitutional:      General: He is not in acute distress.    Appearance: Normal appearance. He is normal weight. He is not ill-appearing, toxic-appearing or diaphoretic.  HENT:     Head: Normocephalic.     Right Ear: Tympanic membrane, ear canal and external ear normal.     Left Ear: Tympanic membrane, ear canal and external ear normal.     Nose: Nose normal. No congestion or rhinorrhea.     Mouth/Throat:  Mouth: Mucous membranes are moist.  Eyes:     General:        Right eye: No discharge.        Left eye: No discharge.     Extraocular Movements: Extraocular movements intact.     Conjunctiva/sclera: Conjunctivae normal.     Pupils: Pupils are equal, round,  and reactive to light.  Cardiovascular:     Rate and Rhythm: Normal rate and regular rhythm.     Heart sounds: No murmur heard. Pulmonary:     Effort: Pulmonary effort is normal. No respiratory distress.     Breath sounds: Normal breath sounds. No wheezing, rhonchi or rales.  Abdominal:     General: Abdomen is flat. Bowel sounds are normal. There is no distension.     Palpations: Abdomen is soft.     Tenderness: There is no abdominal tenderness. There is no guarding.  Musculoskeletal:     Cervical back: Normal range of motion and neck supple.  Skin:    General: Skin is warm and dry.     Capillary Refill: Capillary refill takes less than 2 seconds.  Neurological:     General: No focal deficit present.     Mental Status: He is alert and oriented to person, place, and time.     Cranial Nerves: No cranial nerve deficit.     Motor: No weakness.     Deep Tendon Reflexes: Reflexes normal.  Psychiatric:        Mood and Affect: Mood normal.        Behavior: Behavior normal.        Thought Content: Thought content normal.        Judgment: Judgment normal.     Results for orders placed or performed in visit on 11/28/23  [employer]ox[employer] Select 13 (MW), Urine   Collection Time: 11/28/23 12:34 PM  Result Value Ref Range   Summary FINAL       Assessment & Plan:   Problem List Items Addressed This Visit       Cardiovascular and Mediastinum   Benign essential hypertension   Chronic.  Controlled.  Continue with current medication regimen of Atenolol  50mg  daily.  Refills sent today.  Labs ordered today.  Recommend checking blood pressures at home.  Bring log to next visit. Return to clinic in 6 months for reevaluation.  Call sooner if concerns arise.       Relevant Medications   atorvastatin  (LIPITOR) 40 MG tablet   Aortic atherosclerosis (HCC)   Chronic.  Controlled.  Noted on imaging in 08/14/2019.  Continue with current medication regimen of ASA and atorvastatin .  Refills sent today.   Labs ordered today.  Return to clinic in 6 months for reevaluation.  Call sooner if concerns arise.       Relevant Medications   atorvastatin  (LIPITOR) 40 MG tablet     Other   Chronic musculoskeletal pain (Chronic)   Chronic concern.  Followed by pain management.  No longer on Norco.  Reviewed recent note from pain mangement.        Relevant Medications   baclofen  (LIORESAL ) 10 MG tablet   DULoxetine  (CYMBALTA ) 30 MG capsule   Hyperlipidemia   Chronic, ongoing.  Continue current medication regimen of Atorvastatin . Lipid panel today.  Refills sent today.  Follow up in 6 months.  Call sooner if concerns arise.       Relevant Medications   atorvastatin  (LIPITOR) 40 MG tablet   Other Relevant Orders   Lipid  panel   Major depression in remission (HCC)   Chronic. Well controlled on Duloxetine  60mg  daily.  Patient also uses Buspar  30mg  BID.  Refill sent today.  Continue with current medication regimen.  Follow up in 6 months.  Call sooner if concerns arise.       Relevant Medications   busPIRone  (BUSPAR ) 30 MG tablet   DULoxetine  (CYMBALTA ) 30 MG capsule   Anxiety   Chronic. Well controlled on Duloxetine  60mg  daily.  Patient also uses Buspar  30mg  BID.  Refill sent today.  Continue with current medication regimen.  Follow up in 6 months.  Call sooner if concerns arise.       Relevant Medications   busPIRone  (BUSPAR ) 30 MG tablet   DULoxetine  (CYMBALTA ) 30 MG capsule   Other Visit Diagnoses       Annual physical exam    -  Primary   Health maintenance reviewed during visit today.  Labs ordered.  Vaccines reviewed.  Colonoscopy up to date.   Relevant Orders   TSH   PSA   Lipid panel   CBC with Differential/Platelet   Comprehensive metabolic panel with GFR   HgB W4X     Elevated glucose       Relevant Orders   HgB A1c         Discussed aspirin prophylaxis for myocardial infarction prevention and decision was it was not indicated  LABORATORY TESTING:  Health  maintenance labs ordered today as discussed above.   The natural history of prostate cancer and ongoing controversy regarding screening and potential treatment outcomes of prostate cancer has been discussed with the patient. The meaning of a false positive PSA and a false negative PSA has been discussed. He indicates understanding of the limitations of this screening test and wishes to proceed with screening PSA testing.   IMMUNIZATIONS:   - Tdap: Tetanus vaccination status reviewed: last tetanus booster within 10 years. - Influenza: postponed till flu season - Pneumovax: Not applicable - Prevnar: Not applicable - COVID: Up to date - HPV: Not applicable - Shingrix vaccine: Up to date  SCREENING: - Colonoscopy: Up to date  Discussed with patient purpose of the colonoscopy is to detect colon cancer at curable precancerous or early stages   - AAA Screening: Not applicable  -Hearing Test: Not applicable  -Spirometry: Not applicable   PATIENT COUNSELING:    Sexuality: Discussed sexually transmitted diseases, partner selection, use of condoms, avoidance of unintended pregnancy  and contraceptive alternatives.   Advised to avoid cigarette smoking.  I discussed with the patient that most people either abstain from alcohol or drink within safe limits (<=14/week and <=4 drinks/occasion for males, <=7/weeks and <= 3 drinks/occasion for females) and that the risk for alcohol disorders and other health effects rises proportionally with the number of drinks per week and how often a drinker exceeds daily limits.  Discussed cessation/primary prevention of drug use and availability of treatment for abuse.   Diet: Encouraged to adjust caloric intake to maintain  or achieve ideal body weight, to reduce intake of dietary saturated fat and total fat, to limit sodium intake by avoiding high sodium foods and not adding table salt, and to maintain adequate dietary potassium and calcium  preferably from fresh  fruits, vegetables, and low-fat dairy products.    stressed the importance of regular exercise  Injury prevention: Discussed safety belts, safety helmets, smoke detector, smoking near bedding or upholstery.   Dental health: Discussed importance of regular tooth brushing, flossing, and dental visits.  Follow up plan: NEXT PREVENTATIVE PHYSICAL DUE IN 1 YEAR. Return in about 6 months (around 10/03/2024) for HTN, HLD, DM2 FU.

## 2024-04-03 NOTE — Assessment & Plan Note (Signed)
 Chronic concern.  Followed by pain management.  No longer on Norco.  Reviewed recent note from pain mangement.

## 2024-04-03 NOTE — Assessment & Plan Note (Signed)
Chronic. Well controlled on Duloxetine  daily.  Patient also uses Buspar  BID.  Refill sent today.  Continue with current medication regimen.  Follow up in 6 months.  Call sooner if concerns arise.

## 2024-04-03 NOTE — Assessment & Plan Note (Signed)
 Chronic.  Controlled.  Continue with current medication regimen of Atenolol  50mg  daily.  Refills sent today.  Labs ordered today.  Recommend checking blood pressures at home.  Bring log to next visit. Return to clinic in 6 months for reevaluation.  Call sooner if concerns arise.

## 2024-04-03 NOTE — Assessment & Plan Note (Signed)
Chronic.  Controlled.  Noted on imaging in 08/14/2019.  Continue with current medication regimen of ASA and atorvastatin.  Refills sent today.  Labs ordered today.  Return to clinic in 6 months for reevaluation.  Call sooner if concerns arise.

## 2024-04-03 NOTE — Assessment & Plan Note (Signed)
 Chronic, ongoing.  Continue current medication regimen of Atorvastatin . Lipid panel today.  Refills sent today.  Follow up in 6 months.  Call sooner if concerns arise.

## 2024-04-04 ENCOUNTER — Ambulatory Visit: Payer: Self-pay | Admitting: Nurse Practitioner

## 2024-04-04 LAB — CBC WITH DIFFERENTIAL/PLATELET
Basophils Absolute: 0.1 10*3/uL (ref 0.0–0.2)
Basos: 1 %
EOS (ABSOLUTE): 0.1 10*3/uL (ref 0.0–0.4)
Eos: 1 %
Hematocrit: 44.1 % (ref 37.5–51.0)
Hemoglobin: 14.4 g/dL (ref 13.0–17.7)
Immature Grans (Abs): 0.1 10*3/uL (ref 0.0–0.1)
Immature Granulocytes: 1 %
Lymphocytes Absolute: 2 10*3/uL (ref 0.7–3.1)
Lymphs: 20 %
MCH: 30.6 pg (ref 26.6–33.0)
MCHC: 32.7 g/dL (ref 31.5–35.7)
MCV: 94 fL (ref 79–97)
Monocytes Absolute: 0.6 10*3/uL (ref 0.1–0.9)
Monocytes: 6 %
Neutrophils Absolute: 7 10*3/uL (ref 1.4–7.0)
Neutrophils: 71 %
Platelets: 297 10*3/uL (ref 150–450)
RBC: 4.71 x10E6/uL (ref 4.14–5.80)
RDW: 13.7 % (ref 11.6–15.4)
WBC: 9.9 10*3/uL (ref 3.4–10.8)

## 2024-04-04 LAB — COMPREHENSIVE METABOLIC PANEL WITH GFR
ALT: 35 IU/L (ref 0–44)
AST: 28 IU/L (ref 0–40)
Albumin: 4.4 g/dL (ref 3.8–4.9)
Alkaline Phosphatase: 57 IU/L (ref 44–121)
BUN/Creatinine Ratio: 15 (ref 9–20)
BUN: 13 mg/dL (ref 6–24)
Bilirubin Total: 0.5 mg/dL (ref 0.0–1.2)
CO2: 19 mmol/L — ABNORMAL LOW (ref 20–29)
Calcium: 9.5 mg/dL (ref 8.7–10.2)
Chloride: 104 mmol/L (ref 96–106)
Creatinine, Ser: 0.87 mg/dL (ref 0.76–1.27)
Globulin, Total: 2 g/dL (ref 1.5–4.5)
Glucose: 118 mg/dL — ABNORMAL HIGH (ref 70–99)
Potassium: 4.6 mmol/L (ref 3.5–5.2)
Sodium: 137 mmol/L (ref 134–144)
Total Protein: 6.4 g/dL (ref 6.0–8.5)
eGFR: 101 mL/min/{1.73_m2} (ref >59)

## 2024-04-04 LAB — LIPID PANEL
Chol/HDL Ratio: 2.9 ratio (ref 0.0–5.0)
Cholesterol, Total: 177 mg/dL (ref 100–199)
HDL: 61 mg/dL (ref >39)
LDL Chol Calc (NIH): 92 mg/dL (ref 0–99)
Triglycerides: 141 mg/dL (ref 0–149)
VLDL Cholesterol Cal: 24 mg/dL (ref 5–40)

## 2024-04-04 LAB — PSA: Prostate Specific Ag, Serum: 0.5 ng/mL (ref 0.0–4.0)

## 2024-04-04 LAB — TSH: TSH: 1.7 u[IU]/mL (ref 0.450–4.500)

## 2024-04-04 LAB — HEMOGLOBIN A1C
Est. average glucose Bld gHb Est-mCnc: 154 mg/dL
Hgb A1c MFr Bld: 7 % — ABNORMAL HIGH (ref 4.8–5.6)

## 2024-04-08 NOTE — Progress Notes (Unsigned)
 PROVIDER NOTE: Interpretation of information contained herein should be left to medically-trained personnel. Specific patient instructions are provided elsewhere under "Patient Instructions" section of medical record. This document was created in part using AI and STT-dictation technology, any transcriptional errors that may result from this process are unintentional.  Patient: Jose Washington  Service: E/M   PCP: Aileen Alexanders, NP  DOB: Mar 02, 1966  DOS: 04/09/2024  Provider: Candi Chafe, MD  MRN: [id number]  Delivery: Virtual Visit  Specialty: Interventional Pain Management  Type: Established Patient  Setting: Ambulatory outpatient facility  Specialty designation: 09  Referring Prov.: Aileen Alexanders, NP  Location: Remote location       Virtual Encounter - Pain Management PROVIDER NOTE: Information contained herein reflects review and annotations entered in association with encounter. Interpretation of such information and data should be left to medically-trained personnel. Information provided to patient can be located elsewhere in the medical record under "Patient Instructions". Document created using STT-dictation technology, any transcriptional errors that may result from process are unintentional.    Contact & Pharmacy Preferred: [phone] Home: [phone] (home) Mobile: [phone] (mobile) E-mail: [email or web address]  [id number] [place], [place] - [address] [place] [address] Phone: [phone] Fax: [phone]   Pre-screening  Jose Washington offered "in-person" vs "virtual" encounter. He indicated preferring virtual for this encounter.   Reason COVID-19*  Social distancing based on CDC and AMA recommendations.   I contacted Jose Washington on 04/09/2024 via telephone.      I clearly identified myself as Candi Chafe, MD. I verified that I was speaking with the correct person using two identifiers (Name: Jose Washington, and date of birth: December 22, 1965).  Consent I sought verbal advanced consent from Jose Washington for virtual visit interactions. I informed Jose Washington of possible security and privacy concerns, risks, and limitations associated with providing "not-in-person" medical evaluation and management services. I also informed Jose Washington of the availability of "in-person" appointments. Finally, I informed him that there would be a charge for the virtual visit and that he could be  personally, fully or partially, financially responsible for it. Jose Washington expressed understanding and agreed to proceed.   Historic Elements   Jose Washington is a 58 y.o. year old, male patient evaluated today after our last contact on 03/19/2024. Jose Washington  has a past medical history of Allergy, Anxiety, Chronic duodenal ulcer with hemorrhage (2012), Chronic neck pain, Depression, GERD (gastroesophageal reflux disease), Hyperlipidemia, Hypertension, and Microscopic hematuria. He also  has a past surgical history that includes eye muscle repair (1972 and 1975); bLEEDING ULCER (2012); Flexible sigmoidoscopy; Colonoscopy with propofol  (N/A, 03/24/2021); and Esophagogastroduodenoscopy (egd) with propofol  (N/A, 03/24/2021). Jose Washington has a current medication list which includes the following prescription(s): acetaminophen , prevagen extra strength, atenolol , atorvastatin , baclofen , buspirone , calcium  carbonate, vitamin d3, desloratadine-pseudoephedrine, diphenhydramine hcl, docusate sodium, duloxetine , elderberry, fluticasone, glucosamine-chondroit-vit c-mn, hydroxyzine , ibuprofen, itraconazole , levalbuterol , magnesium oxide, melatonin, methylsulfonylmethane, multivitamin, omeprazole , ondansetron , potassium, simethicone , sucralfate , cyanocobalamin, vitamin e, and hydrocodone -acetaminophen . He  reports that he has never smoked. He has never used smokeless tobacco. He reports that he does not drink alcohol and does not use drugs.  Jose Washington has no known allergies.  BMI: Estimated body mass index is 28.19 kg/m as calculated from the following:   Height as of 04/03/24: 5\' 7"  (1.702 m).   Weight as of 04/03/24: 180 lb (81.6 kg). Last encounter: 03/19/2024. Last procedure: 12/27/2023.  HPI  Today, he is being  contacted for a post-procedure assessment.   History of Present Illness   He is a 58 year old male who presents for follow-up after trigger point injections.  He has no pain in the area where the trirapoint injections were administered, indicating successful management of that specific pain.  He continues to experience musculoskeletal pain around the neck, which has not yet been treated. The pain is described as normal muscular pain, suggesting a chronic issue rather than an acute exacerbation.  He owns a TENS unit but has not been using it frequently for his neck pain.       Post-Procedure Evaluation   Type:  Right trapezius, right longissimus Thoracics muscle, & right erector spinae muscle Trigger Point Injection (Myoneural Block) (1-2 muscle groups)  #3 (w/ steroids)  CPT: 20552 Laterality: Right (-RT)   Imaging: N/A. Landmark-guided",           Anesthesia: Local anesthesia (1-2% Lidocaine ) Anxiolysis: None                 Sedation: No Sedation                       DOS: 03/19/2024  Performed by: Candi Chafe, MD  Medical Necessity (reasoning)  Purpose: Diagnostic/Therapeutic Rationale (medical necessity): procedure needed and proper for the diagnosis and/or treatment of Jose Washington medical symptoms and needs. Indications: Right-sided neck pain, right Sub scapular pain, and right-sided low back pain severe enough to impact quality of life and/or function. 1. Trigger point with neck pain   2. Trigger point of shoulder region (Right)   3. Trigger point of thoracic region (Right)   4. Trigger point with back pain   5. Chronic musculoskeletal pain   6. Chronic low back pain (1ry area of Pain)  (Bilateral) (L>R) w/o sciatica    7. Chronic shoulder pain (Right)   8. Chronic neck pain (3ry area of Pain)    NAS-11 Pain score:   Pre-procedure: 4 /10   Post-procedure: 4 /10       Effectiveness:  Initial hour after procedure: 100 %. Subsequent 4-6 hours post-procedure: 100 %. Analgesia past initial 6 hours: 100 % (ongoing). Ongoing improvement:  Analgesic: The patient indicates having attained 100% relief of the pain for the duration of the local anesthetic which in fact has persisted past that duration and seems to be ongoing.  He still has a little bit of discomfort in the muscles around the neck and I have recommended today to use a TENS unit. Function: Jose Washington reports improvement in function ROM: Jose Washington reports improvement in ROM  Pharmacotherapy Assessment  Opioid Analgesic: No chronic opioid analgesics therapy prescribed by our practice. Hydrocodone /APAP 5/325, 1 tab PO QD (5 mg/day of hydrocodone ) MME/day: 5 mg/day.   Monitoring: [place] PMP: PDMP reviewed during this encounter.       Pharmacotherapy: No side-effects or adverse reactions reported. Compliance: No problems identified. Effectiveness: Clinically acceptable. Plan: Refer to "POC".  UDS:  Summary  Date Value Ref Range Status  11/28/2023 FINAL  Final    Comment:    ==================================================================== Tox[employer] Select 13 (MW) ==================================================================== Specimen Alert Not Detected result may be consistent with the time of last use noted for this medication. AS NEEDED (Hydrocodone ) ==================================================================== Test                             Result       Flag  Units  Drug Absent but Declared for Prescription Verification   Hydrocodone                     Not Detected UNEXPECTED ng/mg creat ==================================================================== Test                       Result    Flag   Units      Ref Range   Creatinine              288              mg/dL      >=20 ==================================================================== Declared Medications:  The flagging and interpretation on this report are based on the  following declared medications.  Unexpected results may arise from  inaccuracies in the declared medications.   **Note: The testing scope of this panel includes these medications:   Hydrocodone  (Norco)   **Note: The testing scope of this panel does not include the  following reported medications:   Acetaminophen   Acetaminophen  (Norco)  Atenolol  (Tenormin )  Atorvastatin  (Lipitor)  Baclofen  (Lioresal )  Buspirone  (Buspar )  Calcium   Desloratadine (Clarinex)  Diphenhydramine  Docusate (Colace)  Duloxetine  (Cymbalta )  Fluticasone (Flonase)  Hydroxyzine   Ibuprofen (Advil)  Levalbuterol  (Xopenex )  Magnesium (Mag-Ox)  Melatonin  Methylsulfonylmethane  Multivitamin  Omeprazole  (Prilosec)  Ondansetron  (Zofran )  Potassium  Simethicone  (Mylicon)  Sucralfate  (Carafate )  Supplement (Prevagen)  Vitamin B12  Vitamin D3  Vitamin E ==================================================================== For clinical consultation, please call [phone]. ====================================================================    No results found for: "CBDTHCR", "D8THCCBX", "D9THCCBX"  Laboratory Chemistry Profile   Renal Lab Results  Component Value Date   BUN 13 04/03/2024   CREATININE 0.87 04/03/2024   BCR 15 04/03/2024   GFRAA 84 12/24/2020   GFRNONAA >60 02/08/2021    Hepatic Lab Results  Component Value Date   AST 28 04/03/2024   ALT 35 04/03/2024   ALBUMIN 4.4 04/03/2024   ALKPHOS 57 04/03/2024   LIPASE 28 02/08/2021    Electrolytes Lab Results  Component Value Date   NA 137 04/03/2024   K 4.6 04/03/2024   CL 104 04/03/2024   CALCIUM  9.5 04/03/2024   MG 2.0 11/12/2016    Bone Lab Results  Component Value  Date   VD25OH 72.4 09/20/2023   25OHVITD1 23 (L) 11/12/2016   25OHVITD2 <1.0 11/12/2016   25OHVITD3 23 11/12/2016    Inflammation (CRP: Acute Phase) (ESR: Chronic Phase) Lab Results  Component Value Date   CRP 1.4 (H) 11/12/2016   ESRSEDRATE 3 11/12/2016         Note: Above Lab results reviewed.  Imaging   DG PAIN CLINIC C-ARM 1-60 MIN NO REPORT Fluoro was used, but no Radiologist interpretation will be provided.  Please refer to "NOTES" tab for provider progress note.  Assessment  The primary encounter diagnosis was Chronic musculoskeletal pain. Diagnoses of Chronic shoulder pain (Right), Chronic low back pain (1ry area of Pain) (Bilateral) (L>R) w/o sciatica , Trigger point with neck pain, Trigger point with back pain, Trigger point of shoulder region (Right), Trigger point of thoracic region (Right), and Postop check were also pertinent to this visit.  Plan of Care  Problem-specific:  No problem-specific Assessment & Plan notes found for this encounter.  Jose Washington has a current medication list which includes the following long-term medication(s): atenolol , atorvastatin , baclofen , calcium  carbonate, diphenhydramine hcl, duloxetine , levalbuterol , omeprazole , potassium, simethicone , sucralfate , and hydrocodone -acetaminophen .  Pharmacotherapy (Medications Ordered): No orders of  the defined types were placed in this encounter.  Orders:  Orders Placed This Encounter  Procedures   Nursing Instructions:    Please complete this patient's postprocedure evaluation.    Scheduling Instructions:     Please complete this patient's postprocedure evaluation.   Follow-up plan:   Return if symptoms worsen or fail to improve.      Interventional Therapies  Risk Factors  Considerations:   Abnormal UDS (03/27/2020) & (08/20/20) (+) undisclosed carboxy-THC    Planned  Pending:   Therapeutic Right trapezius, right longissimus Thoracics muscle, & right erector spinae muscle  trigger point injections (03/19/2024)    Under consideration:   Therapeutic right IA hip joint injection #2  Therapeutic right lumbar facet RFA #3    Completed:   Diagnostic/therapeutic right IA hip inj. x1 (12/09/2020) (100/100/95/90-95)  Therapeutic right lumbar facet RFA x2 (11/26/2021) (100/100/75/75)  Palliative right lumbar facet MBB x2 (06/07/2019) (100/100/100 x20 days/>75 x3 months)  Diagnostic/therapeutic left lumbar facet MBB x1 (06/07/2019) (100/100/100 x20 days/>75 x3 months)  Diagnostic/therapeutic left L4-5 LESI x2 (07/25/2018) (n/a)  Diagnostic/therapeutic left L5-S1 LESI x1 (12/27/2023) (100/100/100/100) Palliative left CESI x3 (02/16/2018) (100/100/100/100)  Diagnostic right cervical facet MBB x2 (08/19/2022) (100/100/100/100) (w/ local anesthetic only)  Diagnostic left cervical facet MBB x1 (01/10/2020) (100/100/100)  Therapeutic right cervical facet RFA x1 (04/12/2023) (100/100/90/90)  Diagnostic/therapeutic right shoulder region TPI/MNB x2 (07/19/2022) (100/100/100/100)    Therapeutic  Palliative (PRN) options:   Therapeutic lumbar facet RFA   Therapeutic/palliative lumbar facet MBB   Therapeutic L4-5 LESI   Therapeutic CESI   Therapeutic right IA hip joint injection #2  Diagnostic cervical facet MBB #2    Pharmacotherapy  Nonopioids transferred 08/20/2020: Baclofen        Recent Visits Date Type Provider Dept  03/19/24 Office Visit Renaldo Caroli, MD [facility]  01/12/24 Office Visit Renaldo Caroli, MD [facility]  Showing recent visits within past 90 days and meeting all other requirements Today's Visits Date Type Provider Dept  04/09/24 Office Visit Renaldo Caroli, MD [facility]  Showing today's visits and meeting all other requirements Future Appointments No visits were found meeting these conditions. Showing future appointments within next 90 days and meeting all other requirements  I discussed the assessment  and treatment plan with the patient. The patient was provided an opportunity to ask questions and all were answered. The patient agreed with the plan and demonstrated an understanding of the instructions.  Patient advised to call back or seek an in-person evaluation if the symptoms or condition worsens.  Duration of encounter: 12 minutes.  Note by: Candi Chafe, MD Date: 04/09/2024; Time: 12:36 PM

## 2024-04-09 ENCOUNTER — Ambulatory Visit: Attending: Pain Medicine | Admitting: Pain Medicine

## 2024-04-09 DIAGNOSIS — Z09 Encounter for follow-up examination after completed treatment for conditions other than malignant neoplasm: Secondary | ICD-10-CM

## 2024-04-09 DIAGNOSIS — G8929 Other chronic pain: Secondary | ICD-10-CM

## 2024-04-09 DIAGNOSIS — M542 Cervicalgia: Secondary | ICD-10-CM | POA: Diagnosis not present

## 2024-04-09 DIAGNOSIS — M546 Pain in thoracic spine: Secondary | ICD-10-CM

## 2024-04-09 DIAGNOSIS — M549 Dorsalgia, unspecified: Secondary | ICD-10-CM

## 2024-04-09 DIAGNOSIS — M545 Low back pain, unspecified: Secondary | ICD-10-CM

## 2024-04-09 DIAGNOSIS — M25511 Pain in right shoulder: Secondary | ICD-10-CM | POA: Diagnosis not present

## 2024-04-09 DIAGNOSIS — M7918 Myalgia, other site: Secondary | ICD-10-CM | POA: Diagnosis not present

## 2024-04-09 NOTE — Patient Instructions (Signed)
 ______________________________________________________________________    TENS (Device can be purchased "online", without prescription. Search: "TENS 7000".) Transcutaneous electrical nerve stimulation (TENS) is a method of pain relief involving the use of a mild electrical current. A TENS machine is a small, battery-operated device that has leads connected to sticky pads called electrodes.   Rechargeable 9V batteries:     Electrode placement:   TENS UNIT SAFETY WARNING SHEET and INFORMATION INDICATIONS AND CONTRAINDICTIONS Read the operation manual before using the device. Freight forwarder ([place] ) restricts this device to sale by or on the order of a physician. Observe your physician's precise instructions and let him show you where to apply the electrodes. For a successful therapy, the correct application of the electrodes is an important factor. Carefully write down the settings your physician recommended. Indications for use This device is a prescription device and only for symptomatic relief of chronic intractable pain. Contraindications:   Any electrode placement that applies current to the carotid sinus (neck) region.   Patients with implanted electronic devices (for example, a pacemaker) or metallic implants should not undertake.   Any electrode placement that causes current to flow transcerebrally (through the head). The use of unit whenever pain symptoms are undiagnosed, unit etiology is determined.   The use of TENS whenever pain syndromes are undiagnosed, until etiology is established.  WARNINGS AND PRECAUTIONS  Warnings:   The device must be kept out of reach of children.   The safety of device for use during pregnancy or delivery has not been established.   Do not place electrodes on front of the throat. This may result in spasms of the laryngeal and pharyngeal muscles.   Do not place the electrodes over the carotid nerve (side of neck below ear).   The device is not effective for pain of  central origin (headaches).   The device may interfere with electronic monitoring equipment (such as ECG monitors and ECG alarms).   Electrodes should not be placed over the eyes, in the mouth, or internally.   These devices have no curative value.   TENS devices should be used only under the continued supervision of a physician.   TENS is a symptomatic treatment and as such suppresses the sensation of pain which would otherwise serve as a protective mechanism. Precautions/Adverse Reactions   Isolated cases of skin irritation may occur at the site of electrode placement following long-term application.   Stimulation should be stopped and electrodes removed until the cause of the irritation can be determined.   Effectiveness is highly dependent upon patient selection by a person qualified in the management of pain patients.   If the device treatment becomes ineffective or unpleasant, stimulation should be discontinued until reevaluation by a physician/clinician.   Always turn the device off before applying or removing electrodes.   Skin irritation and electrode burns are potential adverse reactions.  PURPOSE: A Transcutaneous Electrical Nerve Stimulator, or TENS, unit is designed to relieve post-operative, acute and chronic pain. It is used for pain caused by peripheral nerves and not central. TENS units are prescription-only devices.  OPERATION: TENS units work in a couple of ways. The first way they are thought to work is by a method called the [employer]. The [employer] states that our brains can only handle one stimulus at a time. When you have chronic pain, this pain signal is constantly being sent to your brain and recognized as pain. When an electrical stimulus is added to the area of pain the body feels  this electrical stimulus, and since the brain can only handle one thing at a time, the pain is not transmitted to the brain. The second method thought to be part of TENS unit's success is by way of  stimulating our own bodies to release their own natural painkillers. TENS units do not work for everyone and results may vary. Always follow the instructions and warnings in your user's manual.  USE: One of the most important tasks that must be performed is battery maintenance. If you are using a Engineering geologist, always fully charge it and fully deplete it before charging it again. These batteries can develop memories and by not performing this charging task correctly, your battery's life can be greatly diminished. If your battery does develop a memory you can help expand the memory by charging for 12 - 13 hours and then completely depleting the battery. Always prepare the skin before applying electrodes. Your skin should be clean and free of any lotions or creams. If you are using electrodes that use conductive gel, apply a small, even layer over the electrode. For carbon, self-adhesive electrodes, apply a drop of water to the electrodes before applying to the skin. The electrodes attach to the lead wires and then the TENS unit. Always grasp the connector and not the cord when inserting or removing. When making adjustments, always make sure the unit's channels (1 and 2) are in the OFF position. The actual settings should be recommended and prescribed by your physician. Medical equipment suppliers don'tset or instruct users as to user settings. When you are using the BURST mode, the unit delivers a series of quick pulses followed by a rest. This cycle repeats itself frequently. Always have channels OFF before changing modes.  For MODULATION mode, the stimulation automatically varies the width of the pulse.  For CONVENTIONAL mode, the stimulation is constant. After the settings have been fine-tuned, set the timer to 30 or 60 minutes. Your physician should also prescribe the use time. When the lights become dim, it means your batteries should be replaced or recharged.  ACCESSORIES: The  electrodes and lead wires can be obtained from your medical equipment supplier. Your medical equipment supplier can set up a recurring delivery to accommodate your needs. Electrodes should be replaced once a month and lead wires once every 6 months.  Video Tutorial [email or web address]  ______________________________________________________________________

## 2024-06-15 ENCOUNTER — Other Ambulatory Visit: Payer: Self-pay | Admitting: Nurse Practitioner

## 2024-06-16 NOTE — Progress Notes (Unsigned)
 PROVIDER NOTE: Interpretation of information contained herein should be left to medically-trained personnel. Specific patient instructions are provided elsewhere under Patient Instructions section of medical record. This document was created in part using AI and STT-dictation technology, any transcriptional errors that may result from this process are unintentional.  Patient: Jose Washington  Service: E/M   PCP: Melvin Pao, NP  DOB: 1965/12/10  DOS: 06/18/2024  Provider: Eric DELENA Como, MD  MRN: [id number]  Delivery: Face-to-face  Specialty: Interventional Pain Management  Type: Established Patient  Setting: Ambulatory outpatient facility  Specialty designation: 09  Referring Prov.: Melvin Pao, NP  Location: Outpatient office facility       History of present illness (HPI) [name], a 58 y.o. year old male, is here today because of his Chronic bilateral low back pain without sciatica [M54.50, G89.29]. Mr. Lovelady primary complain today is No chief complaint on file.  Pertinent problems: [name] has Chronic neck pain (3ry area of Pain); Chronic pain syndrome; Chronic low back pain (1ry area of Pain) (Bilateral) (L>R) w/o sciatica ; Chronic upper back pain (4th area of Pain) (Bilateral); Chronic shoulder pain (Bilateral) (L>R); Osteoarthritis of AC (acromioclavicular) joint (Right); Chronic sacroiliac joint pain (Bilateral) (L>R); Muscle spasticity; Cervical DDD (C4-5, C5-6, C6-7 and C7-T1); Cervical foraminal stenosis (Bilateral: C5-6 & C6-7, Left: C4-5 & C7-T1); Cervical radiculitis (Bilateral) (R>L); Cervical facet syndrome (Bilateral) (L>R); Cervical spondylosis; Musculoskeletal neck pain (trapezius); Chronic knee pain (Left); Chronic lower extremity pain (2ry area of Pain) (Left); Grade 1 Retrolisthesis of L3 over L4; Lumbar facet arthropathy (Bilateral); Lumbar facet osteoarthritis; Lumbar facet syndrome (Bilateral) (R>L); Lumbar spondylosis; Chronic shoulder  pain (Right); Suprascapular neuropathy (Right); DDD (degenerative disc disease), lumbosacral; Lumbar facet hypertrophy (Multilevel) (Bilateral); Spondylosis without myelopathy or radiculopathy, lumbosacral region; Inflammatory spondylopathy of lumbosacral region [facility]); Chronic lower extremity pain (Right); Chronic musculoskeletal pain; Abnormal MRI, lumbar spine (09/07/2017); Cervicalgia (Bilateral); Spondylosis without myelopathy or radiculopathy, cervical region; Myofascial pain syndrome of thoracic spine (serratus muscle) (Right); Chronic flank pain (Right); Abnormal result on screening urine test (03/27/20 & 08/20/20); Chronic hip pain (Bilateral); Chronic hip pain (Right); Enthesopathy of hip region (Right); Osteoarthritis of hip (Right); Trigger point of shoulder region (Right); Pain of cervical facet joint; Trigger point with neck pain; Trigger point with back pain; Lumbar facet joint pain; Sciatica associated with disorder of lumbar spine; Left lumbar radiculopathy; Lumbosacral radiculopathy at S1 (Left); Chronic left-sided low back pain with left-sided sciatica; and Trigger point of thoracic region (Right) on their pertinent problem list.  Pain Assessment: Severity of   is reported as a  /10. Location:    / . Onset:  . Quality:  . Timing:  . Modifying factor(s):  SABRA Vitals:  vitals were not taken for this visit.  BMI: Estimated body mass index is 28.19 kg/m as calculated from the following:   Height as of 04/03/24: 5' 7 (1.702 m).   Weight as of 04/03/24: 180 lb (81.6 kg).  Last encounter: 04/09/2024. Last procedure: 12/27/2023.  Reason for encounter: evaluation of worsening, or previously known (established) problem.   Discussed the use of AI scribe software for clinical note transcription with the patient, who gave verbal consent to proceed.  History of Present Illness           Pharmacotherapy Assessment   Opioid Analgesic: No chronic opioid analgesics therapy prescribed by our practice.  Hydrocodone /APAP 5/325, 1 tab PO QD (5 mg/day of hydrocodone ) MME/day: 5 mg/day.   Monitoring: [place] PMP: PDMP reviewed during  this encounter.       Pharmacotherapy: No side-effects or adverse reactions reported. Compliance: No problems identified. Effectiveness: Clinically acceptable.  No notes on file  UDS:  Summary  Date Value Ref Range Status  11/28/2023 FINAL  Final    Comment:    ==================================================================== [employer] Select 13 (MW) ==================================================================== Specimen Alert Not Detected result may be consistent with the time of last use noted for this medication. AS NEEDED (Hydrocodone ) ==================================================================== Test                             Result       Flag       Units  Drug Absent but Declared for Prescription Verification   Hydrocodone                     Not Detected UNEXPECTED ng/mg creat ==================================================================== Test                      Result    Flag   Units      Ref Range   Creatinine              288              mg/dL      >=79 ==================================================================== Declared Medications:  The flagging and interpretation on this report are based on the  following declared medications.  Unexpected results may arise from  inaccuracies in the declared medications.   **Note: The testing scope of this panel includes these medications:   Hydrocodone  (Norco)   **Note: The testing scope of this panel does not include the  following reported medications:   Acetaminophen   Acetaminophen  (Norco)  Atenolol  (Tenormin )  Atorvastatin  (Lipitor)  Baclofen  (Lioresal )  Buspirone  (Buspar )  Calcium   Desloratadine (Clarinex)  Diphenhydramine  Docusate (Colace)  Duloxetine  (Cymbalta )  Fluticasone (Flonase)  Hydroxyzine   Ibuprofen (Advil)  Levalbuterol  (Xopenex )  Magnesium  (Mag-Ox)  Melatonin  Methylsulfonylmethane  Multivitamin  Omeprazole  (Prilosec)  Ondansetron  (Zofran )  Potassium  Simethicone  (Mylicon)  Sucralfate  (Carafate )  Supplement (Prevagen)  Vitamin B12  Vitamin D3  Vitamin E ==================================================================== For clinical consultation, please call [phone]. ====================================================================     No results found for: CBDTHCR No results found for: D8THCCBX No results found for: D9THCCBX  ROS  Constitutional: Denies any fever or chills Gastrointestinal: No reported hemesis, hematochezia, vomiting, or acute GI distress Musculoskeletal: Denies any acute onset joint swelling, redness, loss of ROM, or weakness Neurological: No reported episodes of acute onset apraxia, aphasia, dysarthria, agnosia, amnesia, paralysis, loss of coordination, or loss of consciousness  Medication Review  Apoaequorin, Calcium  Carbonate, DULoxetine , Elderberry, Glucosamine-Chondroit-Vit C-Mn, HYDROcodone -acetaminophen , Methylsulfonylmethane, Potassium, Vitamin D3, acetaminophen , atenolol , atorvastatin , baclofen , busPIRone , cyanocobalamin, desloratadine-pseudoephedrine, diphenhydrAMINE HCl, docusate sodium, fluticasone, hydrOXYzine , ibuprofen, itraconazole , levalbuterol , magnesium oxide, melatonin, multivitamin, omeprazole , ondansetron , simethicone , sucralfate , and vitamin E  History Review  Allergy: Mr. Stern has no known allergies. Drug: Mr. Klug  reports no history of drug use. Alcohol:  reports no history of alcohol use. Tobacco:  reports that he has never smoked. He has never used smokeless tobacco. Social: Mr. Waring  reports that he has never smoked. He has never used smokeless tobacco. He reports that he does not drink alcohol and does not use drugs. Medical:  has a past medical history of Allergy, Anxiety, Chronic duodenal ulcer with hemorrhage (2012), Chronic neck pain,  Depression, GERD (gastroesophageal reflux disease), Hyperlipidemia, Hypertension, and Microscopic hematuria. Surgical: Mr.  Jay  has a past surgical history that includes eye muscle repair (1972 and 1975); bLEEDING ULCER (2012); Flexible sigmoidoscopy; Colonoscopy with propofol  (N/A, 03/24/2021); and Esophagogastroduodenoscopy (egd) with propofol  (N/A, 03/24/2021). Family: family history includes Allergies in his brother; Cancer in his maternal grandfather and mother; Dementia in his father and paternal grandfather; Depression in his father; Diabetes in his mother; Mental illness in his father; Stroke in his paternal grandfather.  Laboratory Chemistry Profile   Renal Lab Results  Component Value Date   BUN 13 04/03/2024   CREATININE 0.87 04/03/2024   BCR 15 04/03/2024   GFRAA 84 12/24/2020   GFRNONAA >60 02/08/2021    Hepatic Lab Results  Component Value Date   AST 28 04/03/2024   ALT 35 04/03/2024   ALBUMIN 4.4 04/03/2024   ALKPHOS 57 04/03/2024   LIPASE 28 02/08/2021    Electrolytes Lab Results  Component Value Date   NA 137 04/03/2024   K 4.6 04/03/2024   CL 104 04/03/2024   CALCIUM  9.5 04/03/2024   MG 2.0 11/12/2016    Bone Lab Results  Component Value Date   VD25OH 72.4 09/20/2023   25OHVITD1 23 (L) 11/12/2016   25OHVITD2 <1.0 11/12/2016   25OHVITD3 23 11/12/2016    Inflammation (CRP: Acute Phase) (ESR: Chronic Phase) Lab Results  Component Value Date   CRP 1.4 (H) 11/12/2016   ESRSEDRATE 3 11/12/2016         Note: Above Lab results reviewed.  Recent Imaging Review  DG PAIN CLINIC C-ARM 1-60 MIN NO REPORT Fluoro was used, but no Radiologist interpretation will be provided.  Please refer to NOTES tab for provider progress note. Note: Reviewed        Physical Exam  Vitals: There were no vitals taken for this visit. BMI: Estimated body mass index is 28.19 kg/m as calculated from the following:   Height as of 04/03/24: 5' 7 (1.702 m).   Weight as of  04/03/24: 180 lb (81.6 kg). Ideal: Patient weight not recorded General appearance: Well nourished, well developed, and well hydrated. In no apparent acute distress Mental status: Alert, oriented x 3 (person, place, & time)       Respiratory: No evidence of acute respiratory distress Eyes: PERLA   Assessment   Diagnosis Status  1. Chronic low back pain (1ry area of Pain) (Bilateral) (L>R) w/o sciatica    2. Chronic lower extremity pain (2ry area of Pain) (Left)   3. Chronic neck pain (3ry area of Pain)   4. Cervical radiculitis (Bilateral) (R>L)    Controlled Controlled Controlled   Updated Problems: No problems updated.  Plan of Care  Problem-specific:  Assessment and Plan            Mr. TREMONT GAVITT has a current medication list which includes the following long-term medication(s): atenolol , atorvastatin , baclofen , calcium  carbonate, diphenhydramine hcl, duloxetine , hydrocodone -acetaminophen , levalbuterol , omeprazole , potassium, simethicone , and sucralfate .  Pharmacotherapy (Medications Ordered): No orders of the defined types were placed in this encounter.  Orders:  No orders of the defined types were placed in this encounter.    Interventional Therapies  Risk Factors  Considerations:   Abnormal UDS (03/27/2020) & (08/20/20) (+) undisclosed carboxy-THC    Planned  Pending:   Therapeutic Right trapezius, right longissimus Thoracics muscle, & right erector spinae muscle trigger point injections (03/19/2024)    Under consideration:   Therapeutic right IA hip joint injection #2  Therapeutic right lumbar facet RFA #3    Completed:   Diagnostic/therapeutic right  IA hip inj. x1 (12/09/2020) (100/100/95/90-95)  Therapeutic right lumbar facet RFA x2 (11/26/2021) (100/100/75/75)  Palliative right lumbar facet MBB x2 (06/07/2019) (100/100/100 x20 days/>75 x3 months)  Diagnostic/therapeutic left lumbar facet MBB x1 (06/07/2019) (100/100/100 x20 days/>75 x3 months)   Diagnostic/therapeutic left L4-5 LESI x2 (07/25/2018) (n/a)  Diagnostic/therapeutic left L5-S1 LESI x1 (12/27/2023) (100/100/100/100) Palliative left CESI x3 (02/16/2018) (100/100/100/100)  Diagnostic right cervical facet MBB x2 (08/19/2022) (100/100/100/100) (w/ local anesthetic only)  Diagnostic left cervical facet MBB x1 (01/10/2020) (100/100/100)  Therapeutic right cervical facet RFA x1 (04/12/2023) (100/100/90/90)  Diagnostic/therapeutic right shoulder region TPI/MNB x2 (07/19/2022) (100/100/100/100)    Therapeutic  Palliative (PRN) options:   Therapeutic lumbar facet RFA   Therapeutic/palliative lumbar facet MBB   Therapeutic L4-5 LESI   Therapeutic CESI   Therapeutic right IA hip joint injection #2  Diagnostic cervical facet MBB #2    Pharmacotherapy  Nonopioids transferred 08/20/2020: Baclofen       No follow-ups on file.    Recent Visits Date Type Provider Dept  04/09/24 Office Visit Tanya Glisson, MD [facility]  03/19/24 Office Visit Tanya Glisson, MD [facility]  Showing recent visits within past 90 days and meeting all other requirements Future Appointments Date Type Provider Dept  06/18/24 Appointment Tanya Glisson, MD [facility]  Showing future appointments within next 90 days and meeting all other requirements  I discussed the assessment and treatment plan with the patient. The patient was provided an opportunity to ask questions and all were answered. The patient agreed with the plan and demonstrated an understanding of the instructions.  Patient advised to call back or seek an in-person evaluation if the symptoms or condition worsens.  Duration of encounter: *** minutes.  Total time on encounter, as per AMA guidelines included both the face-to-face and non-face-to-face time personally spent by the physician and/or other qualified health care professional(s) on the day of the encounter (includes time in activities that  require the physician or other qualified health care professional and does not include time in activities normally performed by clinical staff). Physician's time may include the following activities when performed: Preparing to see the patient (e.g., pre-charting review of records, searching for previously ordered imaging, lab work, and nerve conduction tests) Review of prior analgesic pharmacotherapies. Reviewing PMP Interpreting ordered tests (e.g., lab work, imaging, nerve conduction tests) Performing post-procedure evaluations, including interpretation of diagnostic procedures Obtaining and/or reviewing separately obtained history Performing a medically appropriate examination and/or evaluation Counseling and educating the patient/family/caregiver Ordering medications, tests, or procedures Referring and communicating with other health care professionals (when not separately reported) Documenting clinical information in the electronic or other health record Independently interpreting results (not separately reported) and communicating results to the patient/ family/caregiver Care coordination (not separately reported)  Note by: Glisson DELENA Tanya, MD (TTS and AI technology used. I apologize for any typographical errors that were not detected and corrected.) Date: 06/18/2024; Time: 7:22 PM

## 2024-06-18 ENCOUNTER — Encounter: Payer: Self-pay | Admitting: Pain Medicine

## 2024-06-18 ENCOUNTER — Ambulatory Visit: Attending: Pain Medicine | Admitting: Pain Medicine

## 2024-06-18 VITALS — BP 118/72 | HR 59 | Temp 97.7°F | Resp 16 | Ht 67.0 in | Wt 170.0 lb

## 2024-06-18 DIAGNOSIS — G8929 Other chronic pain: Secondary | ICD-10-CM | POA: Diagnosis present

## 2024-06-18 DIAGNOSIS — R937 Abnormal findings on diagnostic imaging of other parts of musculoskeletal system: Secondary | ICD-10-CM | POA: Insufficient documentation

## 2024-06-18 DIAGNOSIS — M47817 Spondylosis without myelopathy or radiculopathy, lumbosacral region: Secondary | ICD-10-CM | POA: Diagnosis present

## 2024-06-18 DIAGNOSIS — M5459 Other low back pain: Secondary | ICD-10-CM | POA: Insufficient documentation

## 2024-06-18 DIAGNOSIS — M545 Low back pain, unspecified: Secondary | ICD-10-CM | POA: Diagnosis present

## 2024-06-18 DIAGNOSIS — M5412 Radiculopathy, cervical region: Secondary | ICD-10-CM

## 2024-06-18 DIAGNOSIS — M47816 Spondylosis without myelopathy or radiculopathy, lumbar region: Secondary | ICD-10-CM | POA: Diagnosis not present

## 2024-06-18 DIAGNOSIS — M4310 Spondylolisthesis, site unspecified: Secondary | ICD-10-CM | POA: Diagnosis present

## 2024-06-18 NOTE — Patient Instructions (Addendum)
 Radiofrequency Ablation Radiofrequency ablation is a procedure that is performed to relieve pain. The procedure is often used for back, neck, or arm pain. Radiofrequency ablation involves the use of a machine that creates radio waves to make heat. During the procedure, the heat is applied to the nerve that carries the pain signal. The heat damages the nerve and interferes with the pain signal. Pain relief usually starts about 2 weeks after the procedure and lasts for 6 months to 1 year. Tell a health care provider about: Any allergies you have. All medicines you are taking, including vitamins, herbs, eye drops, creams, and over-the-counter medicines. Any problems you or family members have had with anesthetic medicines. Any bleeding problems you have. Any surgeries you have had. Any medical conditions you have. Whether you are pregnant or may be pregnant. What are the risks? Generally, this is a safe procedure. However, problems may occur, including: Pain or soreness at the injection site. Allergic reaction to medicines given during the procedure. Bleeding. Infection at the injection site. Damage to nerves or blood vessels. What happens before the procedure? When to stop eating and drinking Follow instructions from your health care provider about what you may eat and drink before your procedure. These may include: 8 hours before the procedure Stop eating most foods. Do not eat meat, fried foods, or fatty foods. Eat only light foods, such as toast or crackers. All liquids are okay except energy drinks and alcohol. 6 hours before the procedure Stop eating. Drink only clear liquids, such as water, clear fruit juice, black coffee, plain tea, and sports drinks. Do not drink energy drinks or alcohol. 2 hours before the procedure Stop drinking all liquids. You may be allowed to take medicine with small sips of water. If you do not follow your health care provider's instructions, your  procedure may be delayed or canceled. Medicines Ask your health care provider about: Changing or stopping your regular medicines. This is especially important if you are taking diabetes medicines or blood thinners. Taking medicines such as aspirin and ibuprofen. These medicines can thin your blood. Do not take these medicines unless your health care provider tells you to take them. Taking over-the-counter medicines, vitamins, herbs, and supplements. General instructions Ask your health care provider what steps will be taken to help prevent infection. These steps may include: Removing hair at the procedure site. Washing skin with a germ-killing soap. Taking antibiotic medicine. If you will be going home right after the procedure, plan to have a responsible adult: Take you home from the hospital or clinic. You will not be allowed to drive. Care for you for the time you are told. What happens during the procedure?  You will be awake during the procedure. You will need to be able to talk with the health care provider during the procedure. An IV will be inserted into one of your veins. You will be given one or more of the following: A medicine to help you relax (sedative). A medicine to numb the area (local anesthetic). Your health care provider will insert a radiofrequency needle into the area to be treated. This is done with the help of fluoroscopy. A wire that carries the radio waves (electrode) will be put through the radiofrequency needle. An electrical pulse will be sent through the electrode to verify the correct nerve that is causing your pain. You will feel a tingling sensation, and you may have muscle twitching. The tissue around the needle tip will be heated by an  electric current that comes from the radiofrequency machine. This will numb the nerves. The needle will be removed. A bandage (dressing) will be put on the insertion area. The procedure may vary among health care providers  and hospitals. What happens after the procedure? Your blood pressure, heart rate, breathing rate, and blood oxygen level will be monitored until you leave the hospital or clinic. Return to your normal activities as told by your health care provider. Ask your health care provider what activities are safe for you. If you were given a sedative during the procedure, it can affect you for several hours. Do not drive or operate machinery until your health care provider says that it is safe. Summary Radiofrequency ablation is a procedure that is performed to relieve pain. The procedure is often used for back, neck, or arm pain. Radiofrequency ablation involves the use of a machine that creates radio waves to make heat. Plan to have a responsible adult take you home from the hospital or clinic. Do not drive or operate machinery until your health care provider says that it is safe. Return to your normal activities as told by your health care provider. Ask your health care provider what activities are safe for you. This information is not intended to replace advice given to you by your health care provider. Make sure you discuss any questions you have with your health care provider. Document Revised: 04/07/2021 Document Reviewed: 04/07/2021 Elsevier Patient Education  2024 [employer] ______________________________________________________________________    Procedure instructions  Stop blood-thinners  Do not eat or drink fluids (other than water) for 6 hours before your procedure  No water for 2 hours before your procedure  Take your blood pressure medicine with a sip of water  Arrive 30 minutes before your appointment  If sedation is planned, bring suitable driver. [place], [place], & public transportation are NOT APPROVED)  Carefully read the Preparing for your procedure detailed instructions  If you have questions call us  at [phone]  Procedure appointments are for procedures only.    NO medication refills or new problem evaluations will be done on procedure days.   Only the scheduled, pre-approved procedure and side will be done.   ______________________________________________________________________      ______________________________________________________________________    Preparing for your procedure  Appointments: If you think you may not be able to keep your appointment, call 24-48 hours in advance to cancel. We need time to make it available to others.  Procedure visits are for procedures only. During your procedure appointment there will be: NO Prescription Refills*. NO medication changes or discussions*. NO discussion of disability issues*. NO unrelated pain problem evaluations*. NO evaluations to order other pain procedures*. *These will be addressed at a separate and distinct evaluation encounter on the provider's evaluation schedule and not during procedure days.  Instructions: Food intake: Avoid eating anything solid for at least 8 hours prior to your procedure. Clear liquid intake: You may take clear liquids such as water up to 2 hours prior to your procedure. (No carbonated drinks. No soda.) Transportation: Unless otherwise stated by your physician, bring a driver. (Driver cannot be a Market researcher, Pharmacist, community, or any other form of public transportation.) Morning Medicines: Except for blood thinners, take all of your other morning medications with a sip of water. Make sure to take your heart and blood pressure medicines. If your blood pressure's lower number is above 100, the case will be rescheduled. Blood thinners: Make sure to stop your blood thinners as instructed.  If you take a blood thinner, but were not instructed to stop it, call our office [phone] and ask to talk to a nurse. Not stopping a blood thinner prior to certain procedures could lead to serious complications. Diabetics on insulin: Notify the staff so that you can be scheduled 1st case  in the morning. If your diabetes requires high dose insulin, take only  of your normal insulin dose the morning of the procedure and notify the staff that you have done so. Preventing infections: Shower with an antibacterial soap the morning of your procedure.  Build-up your immune system: Take 1000 mg of Vitamin C with every meal (3 times a day) the day prior to your procedure. Antibiotics: Inform the nursing staff if you are taking any antibiotics or if you have any conditions that may require antibiotics prior to procedures. (Example: recent joint implants)   Pregnancy: If you are pregnant make sure to notify the nursing staff. Not doing so may result in injury to the fetus, including death.  Sickness: If you have a cold, fever, or any active infections, call and cancel or reschedule your procedure. Receiving steroids while having an infection may result in complications. Arrival: You must be in the facility at least 30 minutes prior to your scheduled procedure. Tardiness: Your scheduled time is also the cutoff time. If you do not arrive at least 15 minutes prior to your procedure, you will be rescheduled.  Children: Do not bring any children with you. Make arrangements to keep them home. Dress appropriately: There is always a possibility that your clothing may get soiled. Avoid long dresses. Valuables: Do not bring any jewelry or valuables.  Reasons to call and reschedule or cancel your procedure: (Following these recommendations will minimize the risk of a serious complication.) Surgeries: Avoid having procedures within 2 weeks of any surgery. (Avoid for 2 weeks before or after any surgery). Flu Shots: Avoid having procedures within 2 weeks of a flu shots or . (Avoid for 2 weeks before or after immunizations). Barium: Avoid having a procedure within 7-10 days after having had a radiological study involving the use of radiological contrast. (Myelograms, Barium swallow or enema study). Heart  attacks: Avoid any elective procedures or surgeries for the initial 6 months after a Myocardial Infarction (Heart Attack). Blood thinners: It is imperative that you stop these medications before procedures. Let us  know if you if you take any blood thinner.  Infection: Avoid procedures during or within two weeks of an infection (including chest colds or gastrointestinal problems). Symptoms associated with infections include: Localized redness, fever, chills, night sweats or profuse sweating, burning sensation when voiding, cough, congestion, stuffiness, runny nose, sore throat, diarrhea, nausea, vomiting, cold or Flu symptoms, recent or current infections. It is specially important if the infection is over the area that we intend to treat. Heart and lung problems: Symptoms that may suggest an active cardiopulmonary problem include: cough, chest pain, breathing difficulties or shortness of breath, dizziness, ankle swelling, uncontrolled high or unusually low blood pressure, and/or palpitations. If you are experiencing any of these symptoms, cancel your procedure and contact your primary care physician for an evaluation.  Remember:  Regular Business hours are:  Monday to Thursday 8:00 AM to 4:00 PM  Provider's Schedule: Eric Como, MD:  Procedure days: Tuesday and Thursday 7:30 AM to 4:00 PM  Wallie Sherry, MD:  Procedure days: Monday and Wednesday 7:30 AM to 4:00 PM Last  Updated: 10/11/2023 ______________________________________________________________________      ______________________________________________________________________  General Risks and Possible Complications  Patient Responsibilities: It is important that you read this as it is part of your informed consent. It is our duty to inform you of the risks and possible complications associated with treatments offered to you. It is your responsibility as a patient to read this and to ask questions about anything that is not  clear or that you believe was not covered in this document.  Patient's Rights: You have the right to refuse treatment. You also have the right to change your mind, even after initially having agreed to have the treatment done. However, under this last option, if you wait until the last second to change your mind, you may be charged for the materials used up to that point.  Introduction: Medicine is not an Visual merchandiser. Everything in Medicine, including the lack of treatment(s), carries the potential for danger, harm, or loss (which is by definition: Risk). In Medicine, a complication is a secondary problem, condition, or disease that can aggravate an already existing one. All treatments carry the risk of possible complications. The fact that a side effects or complications occurs, does not imply that the treatment was conducted incorrectly. It must be clearly understood that these can happen even when everything is done following the highest safety standards.  No treatment: You can choose not to proceed with the proposed treatment alternative. The "PRO(s)" would include: avoiding the risk of complications associated with the therapy. The "CON(s)" would include: not getting any of the treatment benefits. These benefits fall under one of three categories: diagnostic; therapeutic; and/or palliative. Diagnostic benefits include: getting information which can ultimately lead to improvement of the disease or symptom(s). Therapeutic benefits are those associated with the successful treatment of the disease. Finally, palliative benefits are those related to the decrease of the primary symptoms, without necessarily curing the condition (example: decreasing the pain from a flare-up of a chronic condition, such as incurable terminal cancer).  General Risks and Complications: These are associated to most interventional treatments. They can occur alone, or in combination. They fall under one of the following six (6)  categories: no benefit or worsening of symptoms; bleeding; infection; nerve damage; allergic reactions; and/or death. No benefits or worsening of symptoms: In Medicine there are no guarantees, only probabilities. No healthcare provider can ever guarantee that a medical treatment will work, they can only state the probability that it may. Furthermore, there is always the possibility that the condition may worsen, either directly, or indirectly, as a consequence of the treatment. Bleeding: This is more common if the patient is taking a blood thinner, either prescription or over the counter (example: Goody Powders, Fish oil, Aspirin, Garlic, etc.), or if suffering a condition associated with impaired coagulation (example: Hemophilia, cirrhosis of the liver, low platelet counts, etc.). However, even if you do not have one on these, it can still happen. If you have any of these conditions, or take one of these drugs, make sure to notify your treating physician. Infection: This is more common in patients with a compromised immune system, either due to disease (example: diabetes, cancer, human immunodeficiency virus [HIV], etc.), or due to medications or treatments (example: therapies used to treat cancer and rheumatological diseases). However, even if you do not have one on these, it can still happen. If you have any of these conditions, or take one of these drugs, make sure to notify your treating physician. Nerve Damage: This is more common when the treatment is an invasive one,  but it can also happen with the use of medications, such as those used in the treatment of cancer. The damage can occur to small secondary nerves, or to large primary ones, such as those in the spinal cord and brain. This damage may be temporary or permanent and it may lead to impairments that can range from temporary numbness to permanent paralysis and/or brain death. Allergic Reactions: Any time a substance or material comes in contact  with our body, there is the possibility of an allergic reaction. These can range from a mild skin rash (contact dermatitis) to a severe systemic reaction (anaphylactic reaction), which can result in death. Death: In general, any medical intervention can result in death, most of the time due to an unforeseen complication. ______________________________________________________________________    ______________________________________________________________________    TENS (Device can be purchased online, without prescription. Search: TENS 7000.) Transcutaneous electrical nerve stimulation (TENS) is a method of pain relief that involves the use of mild electrical stimulation. A TENS machine is a small, battery-operated device that has leads connected to sticky pads called electrodes. Available at [employer]. (Estimated price as of July 10th, 2025.)  (Estimated [employer] cost: $38.88) Rechargeable 9V batteries:  (Estimated [employer] cost: $12.98)  Larger Reusable 2 x 4 TENS Pads/Electrodes:  (Estimated [employer] cost: $9.99)  Total cost: $61.85    ELECTRODE PLACEMENT:   TENS UNIT SAFETY WARNING SHEET and INFORMATION INDICATIONS AND CONTRAINDICTIONS Read the operation manual before using the device. Federal law ([place] ) restricts this device to sale by or on the order of a physician. Observe your physician's precise instructions and let him show you where to apply the electrodes. For a successful therapy, the correct application of the electrodes is an important factor. Carefully write down the settings your physician recommended. Indications for use This device is a prescription device and only for symptomatic relief of chronic intractable pain.  Contraindications:   Any electrode placement that applies current to the carotid sinus (neck) region.   Patients with implanted electronic devices (for example, a pacemaker) or metallic implants should not undertake.   Any electrode placement that causes  current to flow transcerebrally (through the head). The use of unit whenever pain symptoms are undiagnosed, unit etiology is determined.   The use of TENS whenever pain syndromes are undiagnosed, until etiology is established.   WARNINGS AND PRECAUTIONS  Warnings:   The device must be kept out of reach of children.   The safety of device for use during pregnancy or delivery has not been established.   Do not place electrodes on front of the throat. This may result in spasms of the laryngeal and pharyngeal muscles.   Do not place the electrodes over the carotid nerve (side of neck below ear).   The device is not effective for pain of central origin (headaches).   The device may interfere with electronic monitoring equipment (such as ECG monitors and ECG alarms).   Electrodes should not be placed over the eyes, in the mouth, or internally.   These devices have no curative value.   TENS devices should be used only under the continued supervision of a physician.   TENS is a symptomatic treatment and as such suppresses the sensation of pain which would otherwise serve as a protective mechanism. Precautions/Adverse Reactions   Isolated cases of skin irritation may occur at the site of electrode placement following long-term application.   Stimulation should be stopped and electrodes removed until the cause of the irritation can be determined.  Effectiveness is highly dependent upon patient selection by a person qualified in the management of pain patients.   If the device treatment becomes ineffective or unpleasant, stimulation should be discontinued until reevaluation by a physician/clinician.   Always turn the device off before applying or removing electrodes.   Skin irritation and electrode burns are potential adverse reactions.  PURPOSE: A Transcutaneous Electrical Nerve Stimulator, or TENS, unit is designed to relieve post-operative, acute and chronic pain. It is used for pain caused by peripheral nerves and  not central. TENS units are prescription-only devices.   OPERATION: TENS units work in a couple of ways. The first way they are thought to work is by a method called the [employer]. The [employer] states that our brains can only handle one stimulus at a time. When you have chronic pain, this pain signal is constantly being sent to your brain and recognized as pain. When an electrical stimulus is added to the area of pain the body feels this electrical stimulus, and since the brain can only handle one thing at a time, the pain is not transmitted to the brain. The second method thought to be part of TENS unit's success is by way of stimulating our own bodies to release their own natural painkillers. TENS units do not work for everyone and results may vary. Always follow the instructions and warnings in your user's manual.   USE: One of the most important tasks that must be performed is battery maintenance. If you are using a Engineering geologist, always fully charge it and fully deplete it before charging it again. These batteries can develop memories and by not performing this charging task correctly, your battery's life can be greatly diminished. If your battery does develop a memory you can help expand the memory by charging for 12 - 13 hours and then completely depleting the battery. Always prepare the skin before applying electrodes. Your skin should be clean and free of any lotions or creams. If you are using electrodes that use conductive gel, apply a small, even layer over the electrode. For carbon, self-adhesive electrodes, apply a drop of water to the electrodes before applying to the skin. The electrodes attach to the lead wires and then the TENS unit. Always grasp the connector and not the cord when inserting or removing. When making adjustments, always make sure the unit's channels (1 and 2) are in the OFF position. The actual settings should be recommended and prescribed by your  physician. Medical equipment suppliers don'tset or instruct users as to user settings. When you are using the BURST mode, the unit delivers a series of quick pulses followed by a rest. This cycle repeats itself frequently. Always have channels OFF before changing modes.   For MODULATION mode, the stimulation automatically varies the width of the pulse.   For CONVENTIONAL mode, the stimulation is constant. After the settings have been fine-tuned, set the timer to 30 or 60 minutes. Your physician should also prescribe the use time. When the lights become dim, it means your batteries should be replaced or recharged.   ACCESSORIES: The electrodes and lead wires can be obtained from your medical equipment supplier. Your medical equipment supplier can set up a recurring delivery to accommodate your needs. Electrodes should be replaced once a month and lead wires once every 6 months.  Video Tutorial [email or web address]  ______________________________________________________________________

## 2024-06-18 NOTE — Telephone Encounter (Signed)
 Requested Prescriptions  Pending Prescriptions Disp Refills   atenolol  (TENORMIN ) 50 MG tablet [Pharmacy Med Name: ATENOLOL  50 MG TABLET] 90 tablet 1    Sig: TAKE 1 TABLET BY MOUTH EVERY DAY     Cardiovascular: Beta Blockers 2 Passed - 06/18/2024  4:09 PM      Passed - Cr in normal range and within 360 days    Creatinine, Ser  Date Value Ref Range Status  04/03/2024 0.87 0.76 - 1.27 mg/dL Final         Passed - Last BP in normal range    BP Readings from Last 1 Encounters:  06/18/24 118/72         Passed - Last Heart Rate in normal range    Pulse Readings from Last 1 Encounters:  06/18/24 (!) 59         Passed - Valid encounter within last 6 months    Recent Outpatient Visits           2 months ago Annual physical exam   [place] [facility] [place], Darice, NP               omeprazole  (PRILOSEC) 20 MG capsule [Pharmacy Med Name: OMEPRAZOLE  DR 20 MG CAPSULE] 90 capsule 1    Sig: TAKE 1 CAPSULE BY MOUTH DAILY. SCHEDULE OFFICE VISIT     Gastroenterology: Proton Pump Inhibitors Passed - 06/18/2024  4:09 PM      Passed - Valid encounter within last 12 months    Recent Outpatient Visits           2 months ago Annual physical exam   [place] [facility] [place] Darice, NP               hydrOXYzine  (ATARAX ) 25 MG tablet [Pharmacy Med Name: HYDROXYZINE  HCL 25 MG TABLET] 270 tablet 1    Sig: TAKE 1 TABLET BY MOUTH THREE TIMES A DAY AS NEEDED     Ear, Nose, and Throat:  Antihistamines 2 Passed - 06/18/2024  4:09 PM      Passed - Cr in normal range and within 360 days    Creatinine, Ser  Date Value Ref Range Status  04/03/2024 0.87 0.76 - 1.27 mg/dL Final         Passed - Valid encounter within last 12 months    Recent Outpatient Visits           2 months ago Annual physical exam   [place] [facility] Melvin Darice, NP

## 2024-06-18 NOTE — Progress Notes (Signed)
 Safety precautions to be maintained throughout the outpatient stay will include: orient to surroundings, keep bed in low position, maintain call bell within reach at all times, provide assistance with transfer out of bed and ambulation.

## 2024-07-17 ENCOUNTER — Ambulatory Visit
Admission: RE | Admit: 2024-07-17 | Discharge: 2024-07-17 | Disposition: A | Discharge status: Home / Self Care | Source: Ambulatory Visit | Attending: Pain Medicine | Admitting: Pain Medicine

## 2024-07-17 ENCOUNTER — Encounter: Payer: Self-pay | Admitting: Pain Medicine

## 2024-07-17 ENCOUNTER — Ambulatory Visit: Admitting: Pain Medicine

## 2024-07-17 VITALS — BP 110/84 | HR 61 | Temp 98.2°F | Resp 15 | Ht 67.0 in | Wt 170.0 lb

## 2024-07-17 DIAGNOSIS — M545 Low back pain, unspecified: Secondary | ICD-10-CM | POA: Insufficient documentation

## 2024-07-17 DIAGNOSIS — M51379 Other intervertebral disc degeneration, lumbosacral region without mention of lumbar back pain or lower extremity pain: Secondary | ICD-10-CM | POA: Diagnosis present

## 2024-07-17 DIAGNOSIS — M47816 Spondylosis without myelopathy or radiculopathy, lumbar region: Secondary | ICD-10-CM

## 2024-07-17 DIAGNOSIS — R937 Abnormal findings on diagnostic imaging of other parts of musculoskeletal system: Secondary | ICD-10-CM | POA: Diagnosis present

## 2024-07-17 DIAGNOSIS — M5459 Other low back pain: Secondary | ICD-10-CM | POA: Insufficient documentation

## 2024-07-17 DIAGNOSIS — G8929 Other chronic pain: Secondary | ICD-10-CM | POA: Diagnosis present

## 2024-07-17 DIAGNOSIS — M4310 Spondylolisthesis, site unspecified: Secondary | ICD-10-CM | POA: Diagnosis present

## 2024-07-17 DIAGNOSIS — M47817 Spondylosis without myelopathy or radiculopathy, lumbosacral region: Secondary | ICD-10-CM | POA: Diagnosis present

## 2024-07-17 DIAGNOSIS — G8918 Other acute postprocedural pain: Secondary | ICD-10-CM | POA: Diagnosis present

## 2024-07-17 MED ORDER — MIDAZOLAM HCL 5 MG/5ML IJ SOLN
INTRAMUSCULAR | Status: AC
  Filled 2024-07-17: qty 5

## 2024-07-17 MED ORDER — OXYCODONE-ACETAMINOPHEN 5-325 MG PO TABS: 1.0000 | ORAL_TABLET | Freq: Three times a day (TID) | ORAL | 0 refills | Status: AC | PRN

## 2024-07-17 MED ORDER — FENTANYL CITRATE (PF) 100 MCG/2ML IJ SOLN
INTRAMUSCULAR | Status: AC
  Filled 2024-07-17: qty 2

## 2024-07-17 MED ORDER — ROPIVACAINE HCL 2 MG/ML IJ SOLN
9.0000 mL | Freq: Once | INTRAMUSCULAR | Status: AC
  Administered 2024-07-17: 9 mL via PERINEURAL

## 2024-07-17 MED ORDER — PENTAFLUOROPROP-TETRAFLUOROETH EX AERO
INHALATION_SPRAY | Freq: Once | CUTANEOUS | Status: AC
  Administered 2024-07-17: 30 via TOPICAL

## 2024-07-17 MED ORDER — TRIAMCINOLONE ACETONIDE 40 MG/ML IJ SUSP
INTRAMUSCULAR | Status: AC
  Filled 2024-07-17: qty 1

## 2024-07-17 MED ORDER — MIDAZOLAM HCL 5 MG/5ML IJ SOLN
0.5000 mg | Freq: Once | INTRAMUSCULAR | Status: AC
  Administered 2024-07-17 (×2): 1 mg via INTRAVENOUS

## 2024-07-17 MED ORDER — TRIAMCINOLONE ACETONIDE 40 MG/ML IJ SUSP
40.0000 mg | Freq: Once | INTRAMUSCULAR | Status: AC
  Administered 2024-07-17: 40 mg

## 2024-07-17 MED ORDER — LIDOCAINE HCL 2 % IJ SOLN
20.0000 mL | Freq: Once | INTRAMUSCULAR | Status: AC
  Administered 2024-07-17: 400 mg

## 2024-07-17 MED ORDER — LIDOCAINE HCL 2 % IJ SOLN
INTRAMUSCULAR | Status: AC
Start: 2024-07-17 — End: 2024-07-17
  Filled 2024-07-17: qty 20

## 2024-07-17 MED ORDER — ROPIVACAINE HCL 2 MG/ML IJ SOLN
INTRAMUSCULAR | Status: AC
  Filled 2024-07-17: qty 20

## 2024-07-17 MED ORDER — FENTANYL CITRATE (PF) 100 MCG/2ML IJ SOLN
25.0000 ug | INTRAMUSCULAR | Status: DC | PRN
  Administered 2024-07-17: 50 ug via INTRAVENOUS

## 2024-07-17 NOTE — Progress Notes (Signed)
 PROVIDER NOTE: Interpretation of information contained herein should be left to medically-trained personnel. Specific patient instructions are provided elsewhere under Patient Instructions section of medical record. This document was created in part using STT-dictation technology, any transcriptional errors that may result from this process are unintentional.  Patient: Jose Washington Type: Established DOB: 09-Mar-1966 MRN: [id number] PCP: Melvin Pao, NP  Service: Procedure DOS: 07/17/2024 Setting: Ambulatory Location: Ambulatory outpatient facility Delivery: Face-to-face Provider: Eric DELENA Como, MD Specialty: Interventional Pain Management Specialty designation: 09 Location: Outpatient facility Ref. Prov.: Como Eric, MD       Interventional Therapy   Procedure: Lumbar Facet, Medial Branch Radiofrequency Ablation (RFA) #3  Laterality: Right (-RT)  Level: L2, L3, L4, L5, and S1 Medial Branch Level(s). These levels will denervate the L3-4, L4-5, and L5-S1 lumbar facet joints.  Imaging: Fluoroscopy-guided Spinal ([id number]) Anesthesia: Local anesthesia (1-2% Lidocaine ) Anxiolysis: IV Versed  2.0 mg Sedation: Minimal Sedation Fentanyl  1 mL (50 mcg) DOS: 07/17/2024  Performed by: Eric DELENA Como, MD  Purpose: Therapeutic/Palliative Indications: Low back pain severe enough to impact quality of life or function. Indications: 1. Chronic low back pain (1ry area of Pain) (Bilateral) (L>R) w/o sciatica    2. Lumbar facet joint pain   3. Lumbar facet hypertrophy (Multilevel) (Bilateral)   4. Lumbar facet osteoarthritis   5. Lumbar facet syndrome (Bilateral) (R>L)   6. Spondylosis without myelopathy or radiculopathy, lumbosacral region   7. Grade 1 Retrolisthesis of L3 over L4   8. Degeneration of intervertebral disc of lumbosacral region, unspecified whether pain present   9. Low back pain of over 3 months duration   10. Intermittent low back pain   11. Intractable  low back pain   12. Multifactorial low back pain   13. Mechanical low back pain    Mr. Kimberlin has been dealing with the above chronic pain for longer than three months and has either failed to respond, was unable to tolerate, or simply did not get enough benefit from other more conservative therapies including, but not limited to: 1. Over-the-counter medications 2. Anti-inflammatory medications 3. Muscle relaxants 4. Membrane stabilizers 5. Opioids 6. Physical therapy and/or chiropractic manipulation 7. Modalities (Heat, ice, etc.) 8. Invasive techniques such as nerve blocks. Mr. Rinkenberger has attained more than 50% relief of the pain from a series of diagnostic injections conducted in separate occasions.  Pain Score: Pre-procedure: 5 /10 Post-procedure: 0-No pain/10     Position / Prep / Materials:  Position: Prone  Prep solution: ChloraPrep (2% chlorhexidine gluconate and 70% isopropyl alcohol) Prep Area: Entire Lumbosacral Region (Lower back from mid-thoracic region to end of tailbone and from flank to flank.) Materials:  Tray: RFA (Radiofrequency) tray Needle(s):  Type: RFA (Teflon-coated radiofrequency ablation needles) Gauge (G): 22  Length: Regular (10cm) Qty: 5     H&P (Pre-op Assessment):  Mr. Gasparro is a 58 y.o. (year old), male patient, seen today for interventional treatment. He  has a past surgical history that includes eye muscle repair (1972 and 1975); bLEEDING ULCER (2012); Flexible sigmoidoscopy; Colonoscopy with propofol  (N/A, 03/24/2021); and Esophagogastroduodenoscopy (egd) with propofol  (N/A, 03/24/2021). Mr. Murad has a current medication list which includes the following prescription(s): acetaminophen , prevagen extra strength, atenolol , atorvastatin , baclofen , buspirone , calcium  carbonate, vitamin d3, desloratadine-pseudoephedrine, diphenhydramine hcl, docusate sodium, duloxetine , elderberry, fluticasone, glucosamine-chondroit-vit c-mn, hydroxyzine , ibuprofen,  levalbuterol , magnesium oxide, melatonin, methylsulfonylmethane, multivitamin, omeprazole , ondansetron , oxycodone -acetaminophen , [START ON 07/24/2024] oxycodone -acetaminophen , potassium, simethicone , sucralfate , cyanocobalamin, vitamin e, and itraconazole , and the following Facility-Administered Medications: fentanyl . His primarily concern  today is the Back Pain (Lower right back)  Initial Vital Signs:  Pulse/HCG Rate: (!) 58ECG Heart Rate: (!) 59 Temp: (!) 97.5 F (36.4 C) Resp: (!) 22 BP: 114/74 SpO2: 100 %  BMI: Estimated body mass index is 26.63 kg/m as calculated from the following:   Height as of this encounter: 5' 7 (1.702 m).   Weight as of this encounter: 170 lb (77.1 kg).  Risk Assessment: Allergies: Reviewed. He has no known allergies.  Allergy Precautions: None required Coagulopathies: Reviewed. None identified.  Blood-thinner therapy: None at this time Active Infection(s): Reviewed. None identified. [name] is afebrile  Site Confirmation: Mr. Saldivar was asked to confirm the procedure and laterality before marking the site Procedure checklist: Completed Consent: Before the procedure and under the influence of no sedative(s), amnesic(s), or anxiolytics, the patient was informed of the treatment options, risks and possible complications. To fulfill our ethical and legal obligations, as recommended by the American Medical Association's Code of Ethics, I have informed the patient of my clinical impression; the nature and purpose of the treatment or procedure; the risks, benefits, and possible complications of the intervention; the alternatives, including doing nothing; the risk(s) and benefit(s) of the alternative treatment(s) or procedure(s); and the risk(s) and benefit(s) of doing nothing. The patient was provided information about the general risks and possible complications associated with the procedure. These may include, but are not limited to: failure to achieve desired  goals, infection, bleeding, organ or nerve damage, allergic reactions, paralysis, and death. In addition, the patient was informed of those risks and complications associated to Spine-related procedures, such as failure to decrease pain; infection (i.e.: Meningitis, epidural or intraspinal abscess); bleeding (i.e.: epidural hematoma, subarachnoid hemorrhage, or any other type of intraspinal or peri-dural bleeding); organ or nerve damage (i.e.: Any type of peripheral nerve, nerve root, or spinal cord injury) with subsequent damage to sensory, motor, and/or autonomic systems, resulting in permanent pain, numbness, and/or weakness of one or several areas of the body; allergic reactions; (i.e.: anaphylactic reaction); and/or death. Furthermore, the patient was informed of those risks and complications associated with the medications. These include, but are not limited to: allergic reactions (i.e.: anaphylactic or anaphylactoid reaction(s)); adrenal axis suppression; blood sugar elevation that in diabetics may result in ketoacidosis or comma; water retention that in patients with history of congestive heart failure may result in shortness of breath, pulmonary edema, and decompensation with resultant heart failure; weight gain; swelling or edema; medication-induced neural toxicity; particulate matter embolism and blood vessel occlusion with resultant organ, and/or nervous system infarction; and/or aseptic necrosis of one or more joints. Finally, the patient was informed that Medicine is not an exact science; therefore, there is also the possibility of unforeseen or unpredictable risks and/or possible complications that may result in a catastrophic outcome. The patient indicated having understood very clearly. We have given the patient no guarantees and we have made no promises. Enough time was given to the patient to ask questions, all of which were answered to the patient's satisfaction. Mr. Vercher has indicated that  he wanted to continue with the procedure. Attestation: I, the ordering provider, attest that I have discussed with the patient the benefits, risks, side-effects, alternatives, likelihood of achieving goals, and potential problems during recovery for the procedure that I have provided informed consent. Date  Time: 07/17/2024  8:04 AM  Pre-Procedure Preparation:  Monitoring: As per clinic protocol. Respiration, ETCO2, SpO2, BP, heart rate and rhythm monitor placed and checked for adequate function  Safety Precautions: Patient was assessed for positional comfort and pressure points before starting the procedure. Time-out: I initiated and conducted the Time-out before starting the procedure, as per protocol. The patient was asked to participate by confirming the accuracy of the Time Out information. Verification of the correct person, site, and procedure were performed and confirmed by me, the nursing staff, and the patient. Time-out conducted as per Joint Commission's Universal Protocol (UP.01.01.01). Time: 0845 Start Time: 0845 hrs.  Description of Procedure:          Laterality: See above. Levels:  See above. Safety Precautions: Aspiration looking for blood return was conducted prior to all injections. At no point did we inject any substances, as a needle was being advanced. Before injecting, the patient was told to immediately notify me if he was experiencing any new onset of ringing in the ears, or metallic taste in the mouth. No attempts were made at seeking any paresthesias. Safe injection practices and needle disposal techniques used. Medications properly checked for expiration dates. SDV (single dose vial) medications used. After the completion of the procedure, all disposable equipment used was discarded in the proper designated medical waste containers. Local Anesthesia: Protocol guidelines were followed. The patient was positioned over the fluoroscopy table. The area was prepped in the  usual manner. The time-out was completed. The target area was identified using fluoroscopy. A 12-in long, straight, sterile hemostat was used with fluoroscopic guidance to locate the targets for each level blocked. Once located, the skin was marked with an approved surgical skin marker. Once all sites were marked, the skin (epidermis, dermis, and hypodermis), as well as deeper tissues (fat, connective tissue and muscle) were infiltrated with a small amount of a short-acting local anesthetic, loaded on a 10cc syringe with a 25G, 1.5-in  Needle. An appropriate amount of time was allowed for local anesthetics to take effect before proceeding to the next step. Technical description of process:  Radiofrequency Ablation (RFA) L2 Medial Branch Nerve RFA: The target area for the L2 medial branch is at the junction of the postero-lateral aspect of the superior articular process and the superior, posterior, and medial edge of the transverse process of L3. Under fluoroscopic guidance, a Radiofrequency needle was inserted until contact was made with os over the superior postero-lateral aspect of the pedicular shadow (target area). Sensory and motor testing was conducted to properly adjust the position of the needle. Once satisfactory placement of the needle was achieved, the numbing solution was slowly injected after negative aspiration for blood. 2.0 mL of the nerve block solution was injected without difficulty or complication. After waiting for at least 3 minutes, the ablation was performed. Once completed, the needle was removed intact. L3 Medial Branch Nerve RFA: The target area for the L3 medial branch is at the junction of the postero-lateral aspect of the superior articular process and the superior, posterior, and medial edge of the transverse process of L4. Under fluoroscopic guidance, a Radiofrequency needle was inserted until contact was made with os over the superior postero-lateral aspect of the pedicular  shadow (target area). Sensory and motor testing was conducted to properly adjust the position of the needle. Once satisfactory placement of the needle was achieved, the numbing solution was slowly injected after negative aspiration for blood. 2.0 mL of the nerve block solution was injected without difficulty or complication. After waiting for at least 3 minutes, the ablation was performed. Once completed, the needle was removed intact. L4 Medial Branch Nerve RFA: The target  area for the L4 medial branch is at the junction of the postero-lateral aspect of the superior articular process and the superior, posterior, and medial edge of the transverse process of L5. Under fluoroscopic guidance, a Radiofrequency needle was inserted until contact was made with os over the superior postero-lateral aspect of the pedicular shadow (target area). Sensory and motor testing was conducted to properly adjust the position of the needle. Once satisfactory placement of the needle was achieved, the numbing solution was slowly injected after negative aspiration for blood. 2.0 mL of the nerve block solution was injected without difficulty or complication. After waiting for at least 3 minutes, the ablation was performed. Once completed, the needle was removed intact. L5 Medial Branch Nerve RFA: The target area for the L5 medial branch is at the junction of the postero-lateral aspect of the superior articular process of S1 and the superior, posterior, and medial edge of the sacral ala. Under fluoroscopic guidance, a Radiofrequency needle was inserted until contact was made with os over the superior postero-lateral aspect of the pedicular shadow (target area). Sensory and motor testing was conducted to properly adjust the position of the needle. Once satisfactory placement of the needle was achieved, the numbing solution was slowly injected after negative aspiration for blood. 2.0 mL of the nerve block solution was injected without  difficulty or complication. After waiting for at least 3 minutes, the ablation was performed. Once completed, the needle was removed intact. S1 Medial Branch Nerve RFA: The target area for the S1 medial branch is located inferior to the junction of the S1 superior articular process and the L5 inferior articular process, posterior, inferior, and lateral to the 6 o'clock position of the L5-S1 facet joint, just superior to the S1 posterior foramen. Under fluoroscopic guidance, the Radiofrequency needle was advanced until contact was made with os over the Target area. Sensory and motor testing was conducted to properly adjust the position of the needle. Once satisfactory placement of the needle was achieved, the numbing solution was slowly injected after negative aspiration for blood. 2.0 mL of the nerve block solution was injected without difficulty or complication. After waiting for at least 3 minutes, the ablation was performed. Once completed, the needle was removed intact. Radiofrequency lesioning (ablation):  Radiofrequency Generator: Medtronic AccurianTM AG 1000 RF Generator Sensory Stimulation Parameters: 50 Hz was used to locate & identify the nerve, making sure that the needle was positioned such that there was no sensory stimulation below 0.3 V or above 0.7 V. Motor Stimulation Parameters: 2 Hz was used to evaluate the motor component. Care was taken not to lesion any nerves that demonstrated motor stimulation of the lower extremities at an output of less than 2.5 times that of the sensory threshold, or a maximum of 2.0 V. Lesioning Technique Parameters: Standard Radiofrequency settings. (Not bipolar or pulsed.) Temperature Settings: 80 degrees C Lesioning time: 60 seconds Stationary intra-operative compliance: Compliant  Once the entire procedure was completed, the treated area was cleaned, making sure to leave some of the prepping solution back to take advantage of its long term bactericidal  properties.    Illustration of the posterior view of the lumbar spine and the posterior neural structures. Laminae of L2 through S1 are labeled. DPRL5, dorsal primary ramus of L5; DPRS1, dorsal primary ramus of S1; DPR3, dorsal primary ramus of L3; FJ, facet (zygapophyseal) joint L3-L4; I, inferior articular process of L4; LB1, lateral branch of dorsal primary ramus of L1; IAB, inferior articular branches from L3  medial branch (supplies L4-L5 facet joint); IBP, intermediate branch plexus; MB3, medial branch of dorsal primary ramus of L3; NR3, third lumbar nerve root; S, superior articular process of L5; SAB, superior articular branches from L4 (supplies L4-5 facet joint also); TP3, transverse process of L3.  Facet Joint Innervation (* possible contribution)  L1-2 T12, L1 (L2*)  Medial Branch  L2-3 L1, L2 (L3*)                     L3-4 L2, L3 (L4*)                     L4-5 L3, L4 (L5*)                     L5-S1 L4, L5, S1                        Vitals:   07/17/24 0912 07/17/24 0915 07/17/24 0926 07/17/24 0935  BP: (!) 127/94 125/88 128/80 110/84  Pulse:      Resp: 18 15 16 15   Temp:  98.6 F (37 C)  98.2 F (36.8 C)  SpO2: 100% 97% 97% 98%  Weight:      Height:        Start Time: 0845 hrs. End Time: 0912 hrs.  Imaging Guidance (Spinal):         Type of Imaging Technique: Fluoroscopy Guidance (Spinal) Indication(s): Fluoroscopy guidance for needle placement to enhance accuracy in procedures requiring precise needle localization for targeted delivery of medication in or near specific anatomical locations not easily accessible without such real-time imaging assistance. Exposure Time: Please see nurses notes. Contrast: None used. Fluoroscopic Guidance: I was personally present during the use of fluoroscopy. Tunnel Vision Technique used to obtain the best possible view of the target area. Parallax error corrected before commencing the procedure. Direction-depth-direction  technique used to introduce the needle under continuous pulsed fluoroscopy. Once target was reached, antero-posterior, oblique, and lateral fluoroscopic projection used confirm needle placement in all planes. Images permanently stored in EMR. Interpretation: No contrast injected. I personally interpreted the imaging intraoperatively. Adequate needle placement confirmed in multiple planes. Permanent images saved into the patient's record.  Antibiotic Prophylaxis:   Anti-infectives (From admission, onward)    None      Indication(s): None identified  Post-operative Assessment:  Post-procedure Vital Signs:  Pulse/HCG Rate: 6169 Temp: 98.2 F (36.8 C) Resp: 15 BP: 110/84 SpO2: 98 %  EBL: None  Complications: No immediate post-treatment complications observed by team, or reported by patient.  Note: The patient tolerated the entire procedure well. A repeat set of vitals were taken after the procedure and the patient was kept under observation following institutional policy, for this type of procedure. Post-procedural neurological assessment was performed, showing return to baseline, prior to discharge. The patient was provided with post-procedure discharge instructions, including a section on how to identify potential problems. Should any problems arise concerning this procedure, the patient was given instructions to immediately contact us , at any time, without hesitation. In any case, we plan to contact the patient by telephone for a follow-up status report regarding this interventional procedure.  Comments:  No additional relevant information.  Plan of Care (POC)  Orders:  Orders Placed This Encounter  Procedures   Radiofrequency,Lumbar    Scheduling Instructions:     Side(s): Right-sided     Level: L3-4, L4-5, and L5-S1 Facets (L2, L3, L4, L5, and S1 Medial  Branch)     Sedation: With Sedation.     Date: 07/17/2024    Where will this procedure be performed?:   [facility]    DG PAIN [facility]IN[facility] C-ARM 1-60 MIN NO REPORT    Intraoperative interpretation by procedural physician at [facility].    Standing Status:   Standing    Number of Occurrences:   1    Reason for exam::   Assistance in needle guidance and placement for procedures requiring needle placement in or near specific anatomical locations not easily accessible without such assistance.   Ambulatory referral to Physical Therapy    Referral Priority:   Routine    Referral Type:   Physical Medicine    Referral Reason:   Specialty Services Required    Requested Specialty:   Physical Therapy    Number of Visits Requested:   1   Informed Consent Details: Physician/Practitioner Attestation; Transcribe to consent form and obtain patient signature    Nursing Order: Transcribe to consent form and obtain patient signature. Note: Always confirm laterality of pain with Mr. Mikelson, before procedure.    Physician/Practitioner attestation of informed consent for procedure/surgical case:   I, the physician/practitioner, attest that I have discussed with the patient the benefits, risks, side effects, alternatives, likelihood of achieving goals and potential problems during recovery for the procedure that I have provided informed consent.    Procedure:   Lumbar Facet Radiofrequency Ablation    Physician/Practitioner performing the procedure:   Jordan Pardini A. Tanya, MD    Indication/Reason:   Low Back Pain, with our without leg pain, due to Facet Joint Arthralgia (Joint Pain) known as Lumbar Facet Syndrome, secondary to Lumbar, and/or Lumbosacral Spondylosis (Arthritis of the Spine), without myelopathy or radiculopathy (Nerve Damage).   Provide equipment / supplies at bedside    Procedure tray: Radiofrequency Tray Additional material: Large hemostat (x1); Small hemostat (x1); Towels (x8); 4x4 sterile sponge pack (x1) Needle type: Teflon-coated Radiofrequency Needle (Disposable  single use) Size: Regular Quantity:  5    Standing Status:   Standing    Number of Occurrences:   1    Specify:   Radiofrequency Tray   Saline lock IV    Have LR [id number] mL available and administer at 125 mL/hr if patient becomes hypotensive.    Standing Status:   Standing    Number of Occurrences:   1     Opioid Analgesic: No chronic opioid analgesics therapy prescribed by our practice. Hydrocodone /APAP 5/325, 1 tab PO QD (5 mg/day of hydrocodone ) MME/day: 5 mg/day.    Medications ordered for procedure: Meds ordered this encounter  Medications   oxyCODONE -acetaminophen  (PERCOCET) 5-325 MG tablet    Sig: Take 1 tablet by mouth every 8 (eight) hours as needed for up to 7 days for severe pain (pain score 7-10). Must last 7 days.    Dispense:  21 tablet    Refill:  0    For acute post-operative pain. Not to be refilled.  Must last 7 days.   oxyCODONE -acetaminophen  (PERCOCET) 5-325 MG tablet    Sig: Take 1 tablet by mouth every 8 (eight) hours as needed for up to 7 days for severe pain (pain score 7-10). Must last 7 days.    Dispense:  21 tablet    Refill:  0    For acute post-operative pain. Not to be refilled.  Must last 7 days.   lidocaine  (XYLOCAINE ) 2 % (with pres) injection 400 mg   pentafluoroprop-tetrafluoroeth (GEBAUERS)  aerosol   midazolam  (VERSED ) 5 MG/5ML injection 0.5-2 mg    Make sure Flumazenil is available in the pyxis when using this medication. If oversedation occurs, administer 0.2 mg IV over 15 sec. If after 45 sec no response, administer 0.2 mg again over 1 min; may repeat at 1 min intervals; not to exceed 4 doses (1 mg)   fentaNYL  (SUBLIMAZE ) injection 25-50 mcg    Make sure Narcan  is available in the pyxis when using this medication. In the event of respiratory depression (RR< 8/min): Titrate NARCAN  (naloxone ) in increments of 0.1 to 0.2 mg IV at 2-3 minute intervals, until desired degree of reversal.   ropivacaine  (PF) 2 mg/mL (0.2%) (NAROPIN ) injection 9 mL   triamcinolone  acetonide (KENALOG -40)  injection 40 mg   Medications administered: We administered lidocaine , pentafluoroprop-tetrafluoroeth, midazolam , ropivacaine  (PF) 2 mg/mL (0.2%), and triamcinolone  acetonide.  See the medical record for exact dosing, route, and time of administration.    Interventional Therapies  Risk Factors  Considerations:   Abnormal UDS (03/27/2020) & (08/20/20) (+) undisclosed carboxy-THC    Planned  Pending:   Therapeutic right lumbar facet RFA #3    Under consideration:   Therapeutic right IA hip joint injection #2  Therapeutic right lumbar facet RFA #3    Completed:   Therapeutic Right trapezius, right longissimus Thoracics muscle, & right erector spinae muscle trigger point injections (03/19/2024)  Diagnostic/therapeutic right IA hip inj. x1 (12/09/2020) (100/100/95/90-95)  Therapeutic right lumbar facet RFA x2 (11/26/2021) (100/100/75/75)  Palliative right lumbar facet MBB x2 (06/07/2019) (100/100/100 x20 days/>75 x3 months)  Diagnostic/therapeutic left lumbar facet MBB x1 (06/07/2019) (100/100/100 x20 days/>75 x3 months)  Diagnostic/therapeutic left L4-5 LESI x2 (07/25/2018) (n/a)  Diagnostic/therapeutic left L5-S1 LESI x1 (12/27/2023) (100/100/100/100) Palliative left CESI x3 (02/16/2018) (100/100/100/100)  Diagnostic right cervical facet MBB x2 (08/19/2022) (100/100/100/100) (w/ local anesthetic only)  Diagnostic left cervical facet MBB x1 (01/10/2020) (100/100/100)  Therapeutic right cervical facet RFA x1 (04/12/2023) (100/100/90/90)  Diagnostic/therapeutic right shoulder region TPI/MNB x2 (07/19/2022) (100/100/100/100)    Therapeutic  Palliative (PRN) options:   Therapeutic lumbar facet RFA   Therapeutic/palliative lumbar facet MBB   Therapeutic L4-5 LESI   Therapeutic CESI   Therapeutic right IA hip joint injection #2  Diagnostic cervical facet MBB #2    Pharmacotherapy  Nonopioids transferred 08/20/2020: Baclofen        Follow-up plan:   Return in about 6 weeks (around  08/28/2024) for Eval-day (M,W), (Face2F), (PPE).     Recent Visits Date Type Provider Dept  06/18/24 Office Visit Tanya Glisson, MD [facility]  Showing recent visits within past 90 days and meeting all other requirements Today's Visits Date Type Provider Dept  07/17/24 Procedure visit Tanya Glisson, MD [facility]  Showing today's visits and meeting all other requirements Future Appointments Date Type Provider Dept  08/29/24 Appointment Tanya Glisson, MD [facility]  Showing future appointments within next 90 days and meeting all other requirements   Disposition: Discharge home  Discharge (Date  Time): 07/17/2024; 0936 hrs.   Primary Care Physician: Melvin Pao, NP Location: [facility] Note by: Glisson DELENA Tanya, MD (TTS technology used. I apologize for any typographical errors that were not detected and corrected.) Date: 07/17/2024; Time: 9:59 AM  Disclaimer:  Medicine is not an Visual merchandiser. The only guarantee in medicine is that nothing is guaranteed. It is important to note that the decision to proceed with this intervention was based on the information collected from the patient.  The Data and conclusions were drawn from the patient's questionnaire, the interview, and the physical examination. Because the information was provided in large part by the patient, it cannot be guaranteed that it has not been purposely or unconsciously manipulated. Every effort has been made to obtain as much relevant data as possible for this evaluation. It is important to note that the conclusions that lead to this procedure are derived in large part from the available data. Always take into account that the treatment will also be dependent on availability of resources and existing treatment guidelines, considered by other Pain Management Practitioners as being common knowledge and practice, at the time of the intervention.  For Medico-Legal purposes, it is also important to point out that variation in procedural techniques and pharmacological choices are the acceptable norm. The indications, contraindications, technique, and results of the above procedure should only be interpreted and judged by a Board-Certified Interventional Pain Specialist with extensive familiarity and expertise in the same exact procedure and technique.

## 2024-07-17 NOTE — Patient Instructions (Signed)

## 2024-07-17 NOTE — Progress Notes (Signed)
 Safety precautions to be maintained throughout the outpatient stay will include: orient to surroundings, keep bed in low position, maintain call bell within reach at all times, provide assistance with transfer out of bed and ambulation.

## 2024-07-18 ENCOUNTER — Telehealth: Payer: Self-pay | Admitting: *Deleted

## 2024-07-18 NOTE — Telephone Encounter (Signed)
 Post procedure call:   no  questions or concerns.

## 2024-08-28 NOTE — Progress Notes (Unsigned)
 PROVIDER NOTE: Interpretation of information contained herein should be left to medically-trained personnel. Specific patient instructions are provided elsewhere under Patient Instructions section of medical record. This document was created in part using AI and STT-dictation technology, any transcriptional errors that may result from this process are unintentional.  Patient: Jose Washington  Service: E/M   PCP: Melvin Pao, NP  DOB: February 05, 1966  DOS: 08/29/2024  Provider: Eric DELENA Como, MD  MRN: [id number]  Delivery: Face-to-face  Specialty: Interventional Pain Management  Type: Established Patient  Setting: Ambulatory outpatient facility  Specialty designation: 09  Referring Prov.: Melvin Pao, NP  Location: Outpatient office facility       History of present illness (HPI) [name], a 57 y.o. year old male, is here today because of his Chronic bilateral low back pain without sciatica [M54.50, G89.29]. [name] primary complain today is No chief complaint on file.  Pertinent problems: [name] has Chronic neck pain (3ry area of Pain); Chronic pain syndrome; Chronic low back pain (1ry area of Pain) (Bilateral) (L>R) w/o sciatica ; Chronic upper back pain (4th area of Pain) (Bilateral); Chronic shoulder pain (Bilateral) (L>R); Osteoarthritis of AC (acromioclavicular) joint (Right); Chronic sacroiliac joint pain (Bilateral) (L>R); Muscle spasticity; Cervical DDD (C4-5, C5-6, C6-7 and C7-T1); Cervical foraminal stenosis (Bilateral: C5-6 & C6-7, Left: C4-5 & C7-T1); Cervical radiculitis (Bilateral) (R>L); Cervical facet syndrome (Bilateral) (L>R); Cervical spondylosis; Musculoskeletal neck pain (trapezius); Chronic knee pain (Left); Chronic lower extremity pain (2ry area of Pain) (Left); Grade 1 Retrolisthesis of L3 over L4; Lumbar facet arthropathy (Bilateral); Lumbar facet osteoarthritis; Lumbar facet syndrome (Bilateral) (R>L); Lumbar spondylosis; Chronic shoulder  pain (Right); Suprascapular neuropathy (Right); DDD (degenerative disc disease), lumbosacral; Lumbar facet hypertrophy (Multilevel) (Bilateral); Spondylosis without myelopathy or radiculopathy, lumbosacral region; Inflammatory spondylopathy of lumbosacral region; Chronic lower extremity pain (Right); Chronic musculoskeletal pain; Abnormal MRI, lumbar spine (09/07/2017); Cervicalgia (Bilateral); Spondylosis without myelopathy or radiculopathy, cervical region; Myofascial pain syndrome of thoracic spine (serratus muscle) (Right); Chronic flank pain (Right); Abnormal result on screening urine test (03/27/20 & 08/20/20); Chronic hip pain (Bilateral); Chronic hip pain (Right); Enthesopathy of hip region (Right); Osteoarthritis of hip (Right); Trigger point of shoulder region (Right); Pain of cervical facet joint; Trigger point with neck pain; Trigger point with back pain; Lumbar facet joint pain; Sciatica associated with disorder of lumbar spine; Left lumbar radiculopathy; Lumbosacral radiculopathy at S1 (Left); Chronic left-sided low back pain with left-sided sciatica; Trigger point of thoracic region (Right); Low back pain of over 3 months duration; Intermittent low back pain; Intractable low back pain; Multifactorial low back pain; and Mechanical low back pain on their pertinent problem list.  Pain Assessment: Severity of   is reported as a  /10. Location:    / . Onset:  . Quality:  . Timing:  . Modifying factor(s):  SABRA Vitals:  vitals were not taken for this visit.  BMI: Estimated body mass index is 26.63 kg/m as calculated from the following:   Height as of 07/17/24: 5' 7 (1.702 m).   Weight as of 07/17/24: 170 lb (77.1 kg).  Last encounter: 06/18/2024. Last procedure: 07/17/2024.  Reason for encounter: post-procedure evaluation and assessment.   Discussed the use of AI scribe software for clinical note transcription with the patient, who gave verbal consent to proceed.  History of Present Illness           Post-Procedure Evaluation   Procedure: Lumbar Facet, Medial Branch Radiofrequency Ablation (RFA) #3  Laterality: Right (-RT)  Level: L2, L3, L4, L5, and S1 Medial Branch Level(s). These levels will denervate the L3-4, L4-5, and L5-S1 lumbar facet joints.  Imaging: Fluoroscopy-guided Spinal ([id number]) Anesthesia: Local anesthesia (1-2% Lidocaine ) Anxiolysis: IV Versed  2.0 mg Sedation: Minimal Sedation Fentanyl  1 mL (50 mcg) DOS: 07/17/2024  Performed by: Eric DELENA Como, MD  Purpose: Therapeutic/Palliative Indications: Low back pain severe enough to impact quality of life or function. Indications: 1. Chronic low back pain (1ry area of Pain) (Bilateral) (L>R) w/o sciatica    2. Lumbar facet joint pain   3. Lumbar facet hypertrophy (Multilevel) (Bilateral)   4. Lumbar facet osteoarthritis   5. Lumbar facet syndrome (Bilateral) (R>L)   6. Spondylosis without myelopathy or radiculopathy, lumbosacral region   7. Grade 1 Retrolisthesis of L3 over L4   8. Degeneration of intervertebral disc of lumbosacral region, unspecified whether pain present   9. Low back pain of over 3 months duration   10. Intermittent low back pain   11. Intractable low back pain   12. Multifactorial low back pain   13. Mechanical low back pain    [name] has been dealing with the above chronic pain for longer than three months and has either failed to respond, was unable to tolerate, or simply did not get enough benefit from other more conservative therapies including, but not limited to: 1. Over-the-counter medications 2. Anti-inflammatory medications 3. Muscle relaxants 4. Membrane stabilizers 5. Opioids 6. Physical therapy and/or chiropractic manipulation 7. Modalities (Heat, ice, etc.) 8. Invasive techniques such as nerve blocks. [name] has attained more than 50% relief of the pain from a series of diagnostic injections conducted in separate occasions.  Pain Score: Pre-procedure: 5  /10 Post-procedure: 0-No pain/10    Effectiveness:  Initial hour after procedure:   ***. Subsequent 4-6 hours post-procedure:   ***. Analgesia past initial 6 hours:   ***. Ongoing improvement:  Analgesic:  *** Function:    ***    ROM:    ***     Pharmacotherapy Assessment   Opioid Analgesic: No chronic opioid analgesics therapy prescribed by our practice. Hydrocodone /APAP 5/325, 1 tab PO QD (5 mg/day of hydrocodone ) MME/day: 5 mg/day.   Monitoring: [place] PMP: PDMP reviewed during this encounter.       Pharmacotherapy: No side-effects or adverse reactions reported. Compliance: No problems identified. Effectiveness: Clinically acceptable.  No notes on file  UDS:  Summary  Date Value Ref Range Status  11/28/2023 FINAL  Final    Comment:    ==================================================================== ToxASSU[employer] Select 13 (MW) ==================================================================== Specimen Alert Not Detected result may be consistent with the time of last use noted for this medication. AS NEEDED (Hydrocodone ) ==================================================================== Test                             Result       Flag       Units  Drug Absent but Declared for Prescription Verification   Hydrocodone                     Not Detected UNEXPECTED ng/mg creat ==================================================================== Test                      Result    Flag   Units      Ref Range   Creatinine              288  mg/dL      >=79 ==================================================================== Declared Medications:  The flagging and interpretation on this report are based on the  following declared medications.  Unexpected results may arise from  inaccuracies in the declared medications.   **Note: The testing scope of this panel includes these medications:   Hydrocodone  (Norco)   **Note: The testing scope of this panel does not  include the  following reported medications:   Acetaminophen   Acetaminophen  (Norco)  Atenolol  (Tenormin )  Atorvastatin  (Lipitor)  Baclofen  (Lioresal )  Buspirone  (Buspar )  Calcium   Desloratadine (Clarinex)  Diphenhydramine  Docusate (Colace)  Duloxetine  (Cymbalta )  Fluticasone (Flonase)  Hydroxyzine   Ibuprofen (Advil)  Levalbuterol  (Xopenex )  Magnesium (Mag-Ox)  Melatonin  Methylsulfonylmethane  Multivitamin  Omeprazole  (Prilosec)  Ondansetron  (Zofran )  Potassium  Simethicone  (Mylicon)  Sucralfate  (Carafate )  Supplement (Prevagen)  Vitamin B12  Vitamin D3  Vitamin E ==================================================================== For clinical consultation, please call [phone]. ====================================================================     No results found for: CBDTHCR No results found for: D8THCCBX No results found for: D9THCCBX  ROS  Constitutional: Denies any fever or chills Gastrointestinal: No reported hemesis, hematochezia, vomiting, or acute GI distress Musculoskeletal: Denies any acute onset joint swelling, redness, loss of ROM, or weakness Neurological: No reported episodes of acute onset apraxia, aphasia, dysarthria, agnosia, amnesia, paralysis, loss of coordination, or loss of consciousness  Medication Review  Apoaequorin, Calcium  Carbonate, DULoxetine , Elderberry, Glucosamine-Chondroit-Vit C-Mn, Methylsulfonylmethane, Potassium, Vitamin D3, acetaminophen , atenolol , atorvastatin , baclofen , busPIRone , cyanocobalamin, desloratadine-pseudoephedrine, diphenhydrAMINE HCl, docusate sodium, fluticasone, hydrOXYzine , ibuprofen, itraconazole , levalbuterol , magnesium oxide, melatonin, multivitamin, omeprazole , ondansetron , simethicone , sucralfate , and vitamin E  History Review  Allergy: [name] has no known allergies. Drug: [name]  reports no history of drug use. Alcohol:  reports no history of alcohol use. Tobacco:  reports  that he has never smoked. He has never used smokeless tobacco. Social: [name]  reports that he has never smoked. He has never used smokeless tobacco. He reports that he does not drink alcohol and does not use drugs. Medical:  has a past medical history of Allergy, Anxiety, Chronic duodenal ulcer with hemorrhage (2012), Chronic neck pain, Depression, GERD (gastroesophageal reflux disease), Hyperlipidemia, Hypertension, and Microscopic hematuria. Surgical: [name]  has a past surgical history that includes eye muscle repair (1972 and 1975); bLEEDING ULCER (2012); Flexible sigmoidoscopy; Colonoscopy with propofol  (N/A, 03/24/2021); and Esophagogastroduodenoscopy (egd) with propofol  (N/A, 03/24/2021). Family: family history includes Allergies in his brother; Cancer in his maternal grandfather and mother; Dementia in his father and paternal grandfather; Depression in his father; Diabetes in his mother; Mental illness in his father; Stroke in his paternal grandfather.  Laboratory Chemistry Profile   Renal Lab Results  Component Value Date   BUN 13 04/03/2024   CREATININE 0.87 04/03/2024   BCR 15 04/03/2024   GFRAA 84 12/24/2020   GFRNONAA >60 02/08/2021    Hepatic Lab Results  Component Value Date   AST 28 04/03/2024   ALT 35 04/03/2024   ALBUMIN 4.4 04/03/2024   ALKPHOS 57 04/03/2024   LIPASE 28 02/08/2021    Electrolytes Lab Results  Component Value Date   NA 137 04/03/2024   K 4.6 04/03/2024   CL 104 04/03/2024   CALCIUM  9.5 04/03/2024   MG 2.0 11/12/2016    Bone Lab Results  Component Value Date   VD25OH 72.4 09/20/2023   25OHVITD1 23 (L) 11/12/2016   25OHVITD2 <1.0 11/12/2016   25OHVITD3 23 11/12/2016    Inflammation (CRP: Acute Phase) (ESR: Chronic Phase) Lab Results  Component  Value Date   CRP 1.4 (H) 11/12/2016   ESRSEDRATE 3 11/12/2016         Note: Above Lab results reviewed.  Recent Imaging Review  DG PAIN CLINIC C-ARM 1-60 MIN NO REPORT Fluoro was  used, but no Radiologist interpretation will be provided.  Please refer to NOTES tab for provider progress note. Note: Reviewed        Physical Exam  Vitals: There were no vitals taken for this visit. BMI: Estimated body mass index is 26.63 kg/m as calculated from the following:   Height as of 07/17/24: 5' 7 (1.702 m).   Weight as of 07/17/24: 170 lb (77.1 kg). Ideal: Patient weight not recorded General appearance: Well nourished, well developed, and well hydrated. In no apparent acute distress Mental status: Alert, oriented x 3 (person, place, & time)       Respiratory: No evidence of acute respiratory distress Eyes: PERLA   Assessment   Diagnosis Status  1. Chronic low back pain (1ry area of Pain) (Bilateral) (L>R) w/o sciatica    2. Lumbar facet joint pain   3. Lumbar facet syndrome (Bilateral) (R>L)   4. Low back pain of over 3 months duration   5. Lumbar facet hypertrophy (Multilevel) (Bilateral)   6. Lumbar facet osteoarthritis   7. Spondylosis without myelopathy or radiculopathy, lumbosacral region   8. Grade 1 Retrolisthesis of L3 over L4   9. Postop check    Controlled Controlled Controlled   Updated Problems: No problems updated.  Plan of Care  Problem-specific:  Assessment and Plan            [name] has a current medication list which includes the following long-term medication(s): atenolol , atorvastatin , baclofen , calcium  carbonate, diphenhydramine hcl, duloxetine , levalbuterol , omeprazole , potassium, simethicone , and sucralfate .  Pharmacotherapy (Medications Ordered): No orders of the defined types were placed in this encounter.  Orders:  No orders of the defined types were placed in this encounter.    Interventional Therapies  Risk Factors  Considerations:   Abnormal UDS (03/27/2020) & (08/20/20) (+) undisclosed carboxy-THC    Planned  Pending:   Therapeutic right lumbar facet RFA #3    Under consideration:   Therapeutic  right IA hip joint injection #2  Therapeutic right lumbar facet RFA #3    Completed:   Therapeutic Right trapezius, right longissimus Thoracics muscle, & right erector spinae muscle trigger point injections (03/19/2024)  Diagnostic/therapeutic right IA hip inj. x1 (12/09/2020) (100/100/95/90-95)  Therapeutic right lumbar facet RFA x2 (11/26/2021) (100/100/75/75)  Palliative right lumbar facet MBB x2 (06/07/2019) (100/100/100 x20 days/>75 x3 months)  Diagnostic/therapeutic left lumbar facet MBB x1 (06/07/2019) (100/100/100 x20 days/>75 x3 months)  Diagnostic/therapeutic left L4-5 LESI x2 (07/25/2018) (n/a)  Diagnostic/therapeutic left L5-S1 LESI x1 (12/27/2023) (100/100/100/100) Palliative left CESI x3 (02/16/2018) (100/100/100/100)  Diagnostic right cervical facet MBB x2 (08/19/2022) (100/100/100/100) (w/ local anesthetic only)  Diagnostic left cervical facet MBB x1 (01/10/2020) (100/100/100)  Therapeutic right cervical facet RFA x1 (04/12/2023) (100/100/90/90)  Diagnostic/therapeutic right shoulder region TPI/MNB x2 (07/19/2022) (100/100/100/100)    Therapeutic  Palliative (PRN) options:   Therapeutic lumbar facet RFA   Therapeutic/palliative lumbar facet MBB   Therapeutic L4-5 LESI   Therapeutic CESI   Therapeutic right IA hip joint injection #2  Diagnostic cervical facet MBB #2    Pharmacotherapy  Nonopioids transferred 08/20/2020: Baclofen       No follow-ups on file.    Recent Visits Date Type Provider Dept  07/17/24 Procedure visit Tanya Glisson, MD [facility]  06/18/24 Office Visit Tanya Glisson, MD [facility]  Showing recent visits within past 90 days and meeting all other requirements Future Appointments Date Type Provider Dept  08/29/24 Appointment Tanya Glisson, MD [facility]  Showing future appointments within next 90 days and meeting all other requirements  I discussed the assessment and treatment plan with the patient.  The patient was provided an opportunity to ask questions and all were answered. The patient agreed with the plan and demonstrated an understanding of the instructions.  Patient advised to call back or seek an in-person evaluation if the symptoms or condition worsens.  Duration of encounter: *** minutes.  Total time on encounter, as per AMA guidelines included both the face-to-face and non-face-to-face time personally spent by the physician and/or other qualified health care professional(s) on the day of the encounter (includes time in activities that require the physician or other qualified health care professional and does not include time in activities normally performed by clinical staff). Physician's time may include the following activities when performed: Preparing to see the patient (e.g., pre-charting review of records, searching for previously ordered imaging, lab work, and nerve conduction tests) Review of prior analgesic pharmacotherapies. Reviewing PMP Interpreting ordered tests (e.g., lab work, imaging, nerve conduction tests) Performing post-procedure evaluations, including interpretation of diagnostic procedures Obtaining and/or reviewing separately obtained history Performing a medically appropriate examination and/or evaluation Counseling and educating the patient/family/caregiver Ordering medications, tests, or procedures Referring and communicating with other health care professionals (when not separately reported) Documenting clinical information in the electronic or other health record Independently interpreting results (not separately reported) and communicating results to the patient/ family/caregiver Care coordination (not separately reported)  Note by: Glisson DELENA Tanya, MD (TTS and AI technology used. I apologize for any typographical errors that were not detected and corrected.) Date: 08/29/2024; Time: 8:05 AM

## 2024-08-29 ENCOUNTER — Ambulatory Visit
Admission: RE | Admit: 2024-08-29 | Discharge: 2024-08-29 | Disposition: A | Discharge status: Home / Self Care | Source: Ambulatory Visit | Attending: Pain Medicine | Admitting: Pain Medicine

## 2024-08-29 ENCOUNTER — Ambulatory Visit: Admitting: Pain Medicine

## 2024-08-29 ENCOUNTER — Encounter: Payer: Self-pay | Admitting: Pain Medicine

## 2024-08-29 VITALS — BP 117/84 | HR 56 | Temp 97.3°F | Ht 67.0 in | Wt 175.0 lb

## 2024-08-29 DIAGNOSIS — G8929 Other chronic pain: Secondary | ICD-10-CM | POA: Insufficient documentation

## 2024-08-29 DIAGNOSIS — Z09 Encounter for follow-up examination after completed treatment for conditions other than malignant neoplasm: Secondary | ICD-10-CM | POA: Insufficient documentation

## 2024-08-29 DIAGNOSIS — M4310 Spondylolisthesis, site unspecified: Secondary | ICD-10-CM | POA: Insufficient documentation

## 2024-08-29 DIAGNOSIS — M5459 Other low back pain: Secondary | ICD-10-CM | POA: Insufficient documentation

## 2024-08-29 DIAGNOSIS — M25552 Pain in left hip: Secondary | ICD-10-CM | POA: Diagnosis present

## 2024-08-29 DIAGNOSIS — M79605 Pain in left leg: Secondary | ICD-10-CM | POA: Insufficient documentation

## 2024-08-29 DIAGNOSIS — M4316 Spondylolisthesis, lumbar region: Secondary | ICD-10-CM

## 2024-08-29 DIAGNOSIS — M5417 Radiculopathy, lumbosacral region: Secondary | ICD-10-CM

## 2024-08-29 DIAGNOSIS — M47816 Spondylosis without myelopathy or radiculopathy, lumbar region: Secondary | ICD-10-CM

## 2024-08-29 DIAGNOSIS — M5442 Lumbago with sciatica, left side: Secondary | ICD-10-CM | POA: Insufficient documentation

## 2024-08-29 DIAGNOSIS — M48061 Spinal stenosis, lumbar region without neurogenic claudication: Secondary | ICD-10-CM | POA: Insufficient documentation

## 2024-08-29 DIAGNOSIS — M47817 Spondylosis without myelopathy or radiculopathy, lumbosacral region: Secondary | ICD-10-CM

## 2024-08-29 DIAGNOSIS — M545 Low back pain, unspecified: Secondary | ICD-10-CM

## 2024-08-29 NOTE — Progress Notes (Signed)
 Safety precautions to be maintained throughout the outpatient stay will include: orient to surroundings, keep bed in low position, maintain call bell within reach at all times, provide assistance with transfer out of bed and ambulation.

## 2024-09-20 ENCOUNTER — Telehealth: Payer: Self-pay

## 2024-09-20 NOTE — Telephone Encounter (Signed)
 Dr. Naveira will not order any procedure without an eval first. Needs eval appt.

## 2024-09-20 NOTE — Telephone Encounter (Signed)
 He is wanting to repeat his Lesi due to sciatica pain. He said you talked bout it in October. Can you put in an order without seeing him again first. It will require prior auth.

## 2024-10-09 NOTE — Progress Notes (Unsigned)
 PROVIDER NOTE: Interpretation of information contained herein should be left to medically-trained personnel. Specific patient instructions are provided elsewhere under Patient Instructions section of medical record. This document was created in part using AI and STT-dictation technology, any transcriptional errors that may result from this process are unintentional.  Patient: Jose Washington  Service: E/M   PCP: Melvin Pao, NP  DOB: 03-03-1966  DOS: 10/10/2024  Provider: Eric DELENA Como, MD  MRN: [id number]  Delivery: Face-to-face  Specialty: Interventional Pain Management  Type: Established Patient  Setting: Ambulatory outpatient facility  Specialty designation: 09  Referring Prov.: Melvin Pao, NP  Location: Outpatient office facility       History of present illness (HPI) [name], a 58 y.o. year old male, is here today because of his Trigger point with neck pain [M54.2]. [name] primary complain today is No chief complaint on file.  Pertinent problems: [name] has Chronic neck pain (3ry area of Pain); Chronic pain syndrome; Chronic low back pain (1ry area of Pain) (Bilateral) (L>R) w/o sciatica ; Chronic upper back pain (4th area of Pain) (Bilateral); Chronic shoulder pain (Bilateral) (L>R); Osteoarthritis of AC (acromioclavicular) joint (Right); Chronic sacroiliac joint pain (Bilateral) (L>R); Muscle spasticity; Cervical DDD (C4-5, C5-6, C6-7 and C7-T1); Cervical foraminal stenosis (Bilateral: C5-6 & C6-7, Left: C4-5 & C7-T1); Cervical radiculitis (Bilateral) (R>L); Cervical facet syndrome (Bilateral) (L>R); Cervical spondylosis; Musculoskeletal neck pain (trapezius); Chronic knee pain (Left); Chronic lower extremity pain (2ry area of Pain) (Left); Grade 1 Retrolisthesis of L3 over L4; Lumbar facet arthropathy (Bilateral); Lumbar facet osteoarthritis; Lumbar facet syndrome (Bilateral) (R>L); Lumbar spondylosis; Chronic shoulder pain (Right); Suprascapular  neuropathy (Right); DDD (degenerative disc disease), lumbosacral; Lumbar facet hypertrophy (Multilevel) (Bilateral); Spondylosis without myelopathy or radiculopathy, lumbosacral region; Inflammatory spondylopathy of lumbosacral region; Chronic lower extremity pain (Right); Chronic musculoskeletal pain; Abnormal MRI, lumbar spine (09/07/2017); Cervicalgia (Bilateral); Spondylosis without myelopathy or radiculopathy, cervical region; Myofascial pain syndrome of thoracic spine (serratus muscle) (Right); Chronic flank pain (Right); Abnormal result on screening urine test (03/27/20 & 08/20/20); Chronic hip pain (Bilateral); Chronic hip pain (Right); Enthesopathy of hip region (Right); Osteoarthritis of hip (Right); Trigger point of shoulder region (Right); Pain of cervical facet joint; Trigger point with neck pain; Trigger point with back pain; Lumbar facet joint pain; Sciatica associated with disorder of lumbar spine; Left lumbar radiculopathy; Lumbosacral radiculopathy at S1 (Left); Chronic left-sided low back pain with left-sided sciatica; Trigger point of thoracic region (Right); Low back pain of over 3 months duration; Intermittent low back pain; Intractable low back pain; Multifactorial low back pain; Mechanical low back pain; Chronic hip pain (Left); and Lumbar foraminal stenosis (Right: L4-5) (Left: L5-S1  SEVERE) on their pertinent problem list.  Pain Assessment: Severity of   is reported as a  /10. Location:    / . Onset:  . Quality:  . Timing:  . Modifying factor(s):  Jose Washington Vitals:  vitals were not taken for this visit.  BMI: Estimated body mass index is 27.41 kg/m as calculated from the following:   Height as of 08/29/24: 5' 7 (1.702 m).   Weight as of 08/29/24: 175 lb (79.4 kg).  Last encounter: 08/29/2024. Last procedure: 07/17/2024.  Reason for encounter: evaluation of worsening, or previously known (established) problem.   Discussed the use of AI scribe software for clinical note transcription  with the patient, who gave verbal consent to proceed.  History of Present Illness           Pharmacotherapy Assessment  Opioid Analgesic: No chronic opioid analgesics therapy prescribed by our practice. Hydrocodone /APAP 5/325, 1 tab PO QD (5 mg/day of hydrocodone ) MME/day: 5 mg/day.   Monitoring: [place] PMP: PDMP reviewed during this encounter.       Pharmacotherapy: No side-effects or adverse reactions reported. Compliance: No problems identified. Effectiveness: Clinically acceptable.  No notes on file  UDS:  Summary  Date Value Ref Range Status  11/28/2023 FINAL  Final    Comment:    ==================================================================== [employer] Select 13 (MW) ==================================================================== Specimen Alert Not Detected result may be consistent with the time of last use noted for this medication. AS NEEDED (Hydrocodone ) ==================================================================== Test                             Result       Flag       Units  Drug Absent but Declared for Prescription Verification   Hydrocodone                     Not Detected UNEXPECTED ng/mg creat ==================================================================== Test                      Result    Flag   Units      Ref Range   Creatinine              288              mg/dL      >=79 ==================================================================== Declared Medications:  The flagging and interpretation on this report are based on the  following declared medications.  Unexpected results may arise from  inaccuracies in the declared medications.   **Note: The testing scope of this panel includes these medications:   Hydrocodone  (Norco)   **Note: The testing scope of this panel does not include the  following reported medications:   Acetaminophen   Acetaminophen  (Norco)  Atenolol  (Tenormin )  Atorvastatin  (Lipitor)  Baclofen  (Lioresal )   Buspirone  (Buspar )  Calcium   Desloratadine (Clarinex)  Diphenhydramine  Docusate (Colace)  Duloxetine  (Cymbalta )  Fluticasone (Flonase)  Hydroxyzine   Ibuprofen (Advil)  Levalbuterol  (Xopenex )  Magnesium (Mag-Ox)  Melatonin  Methylsulfonylmethane  Multivitamin  Omeprazole  (Prilosec)  Ondansetron  (Zofran )  Potassium  Simethicone  (Mylicon)  Sucralfate  (Carafate )  Supplement (Prevagen)  Vitamin B12  Vitamin D3  Vitamin E ==================================================================== For clinical consultation, please call [phone]. ====================================================================     No results found for: CBDTHCR No results found for: D8THCCBX No results found for: D9THCCBX  ROS  Constitutional: Denies any fever or chills Gastrointestinal: No reported hemesis, hematochezia, vomiting, or acute GI distress Musculoskeletal: Denies any acute onset joint swelling, redness, loss of ROM, or weakness Neurological: No reported episodes of acute onset apraxia, aphasia, dysarthria, agnosia, amnesia, paralysis, loss of coordination, or loss of consciousness  Medication Review  Apoaequorin, Calcium  Carbonate, DULoxetine , Elderberry, Fluticasone Propionate (Inhal), Glucosamine HCl, Methylsulfonylmethane, Potassium Chloride ER, Vitamin D3, acetaminophen , atenolol , atorvastatin , baclofen , busPIRone , cyanocobalamin, desloratadine-pseudoephedrine, diphenhydrAMINE HCl, docusate sodium, fluticasone, hydrOXYzine , ibuprofen, levalbuterol , magnesium oxide, melatonin, multivitamin, omeprazole , ondansetron , simethicone , sucralfate , and vitamin E  History Review  Allergy: Mr. Surratt has no known allergies. Drug: Mr. Bates  reports no history of drug use. Alcohol:  reports no history of alcohol use. Tobacco:  reports that he has never smoked. He has never used smokeless tobacco. Social: Mr. Ricciardi  reports that he has never smoked. He has never used smokeless  tobacco. He reports that he does not drink alcohol and  does not use drugs. Medical:  has a past medical history of Allergy, Anxiety, Chronic duodenal ulcer with hemorrhage (2012), Chronic neck pain, Depression, GERD (gastroesophageal reflux disease), Hyperlipidemia, Hypertension, and Microscopic hematuria. Surgical: Mr. Donaway  has a past surgical history that includes eye muscle repair (1972 and 1975); bLEEDING ULCER (2012); Flexible sigmoidoscopy; Colonoscopy with propofol  (N/A, 03/24/2021); and Esophagogastroduodenoscopy (egd) with propofol  (N/A, 03/24/2021). Family: family history includes Allergies in his brother; Cancer in his maternal grandfather and mother; Dementia in his father and paternal grandfather; Depression in his father; Diabetes in his mother; Mental illness in his father; Stroke in his paternal grandfather.  Laboratory Chemistry Profile   Renal Lab Results  Component Value Date   BUN 13 04/03/2024   CREATININE 0.87 04/03/2024   BCR 15 04/03/2024   GFRAA 84 12/24/2020   GFRNONAA >60 02/08/2021    Hepatic Lab Results  Component Value Date   AST 28 04/03/2024   ALT 35 04/03/2024   ALBUMIN 4.4 04/03/2024   ALKPHOS 57 04/03/2024   LIPASE 28 02/08/2021    Electrolytes Lab Results  Component Value Date   NA 137 04/03/2024   K 4.6 04/03/2024   CL 104 04/03/2024   CALCIUM  9.5 04/03/2024   MG 2.0 11/12/2016    Bone Lab Results  Component Value Date   VD25OH 72.4 09/20/2023   25OHVITD1 23 (L) 11/12/2016   25OHVITD2 <1.0 11/12/2016   25OHVITD3 23 11/12/2016    Inflammation (CRP: Acute Phase) (ESR: Chronic Phase) Lab Results  Component Value Date   CRP 1.4 (H) 11/12/2016   ESRSEDRATE 3 11/12/2016         Note: Above Lab results reviewed.  Recent Imaging Review  DG HIP UNILAT W OR W/O PELVIS 2-3 VIEWS LEFT EXAM: 2 Or More View(s) Xray Of The Left Hip 08/29/2024 11:03:00 Am  COMPARISON: 12/05/2020.  CLINICAL HISTORY: Left Hip Pain/Arthralgia. Left Hip  Pain/Arthralgia  FINDINGS:  BONES AND JOINTS: Mild Lateral Acetabular Spurring With Mild Joint Space Narrowing Consistent With Mild Osteoarthritis.  SOFT TISSUES: The Soft Tissues Are Unremarkable.  IMPRESSION: 1. Mild osteoarthritis of the left hip, without significant change since prior exam .  Electronically signed by: Norleen Kil MD 08/30/2024 09:56 AM EDT RP Workstation: [id number] Note: Reviewed        Physical Exam  Vitals: There were no vitals taken for this visit. BMI: Estimated body mass index is 27.41 kg/m as calculated from the following:   Height as of 08/29/24: 5' 7 (1.702 m).   Weight as of 08/29/24: 175 lb (79.4 kg). Ideal: Patient weight not recorded General appearance: Well nourished, well developed, and well hydrated. In no apparent acute distress Mental status: Alert, oriented x 3 (person, place, & time)       Respiratory: No evidence of acute respiratory distress Eyes: PERLA   Assessment   Diagnosis Status  1. Trigger point with neck pain   2. Trigger point with back pain   3. Trigger point of thoracic region (Right)   4. Trigger point of shoulder region (Right)   5. Musculoskeletal neck pain (trapezius)   6. Myofascial pain syndrome of thoracic spine (serratus muscle) (Right)    Controlled Controlled Controlled   Updated Problems: No problems updated.  Plan of Care  Problem-specific:  Assessment and Plan            Mr. JANZEN SACKS has a current medication list which includes the following long-term medication(s): atenolol , atorvastatin , baclofen , calcium  carbonate, diphenhydramine hcl, duloxetine , fluticasone propionate (  inhal), levalbuterol , omeprazole , potassium chloride er, simethicone , and sucralfate .  Pharmacotherapy (Medications Ordered): No orders of the defined types were placed in this encounter.  Orders:  No orders of the defined types were placed in this encounter.    Interventional Therapies  Risk Factors   Considerations:   Abnormal UDS (03/27/2020) & (08/20/20) (+) undisclosed carboxy-THC    Planned  Pending:   Referral to physical therapy for a trial to assist with the left lower extremity pain.   Under consideration:   Diagnostic/therapeutic left L5-S1 TFESI #1  Therapeutic left L5-S1 LESI #2  Diagnostic/therapeutic left lumbar facet MBB #2 with possible RFA follow-up  Diagnostic/therapeutic left IA hip joint inj. #1    Completed:   Therapeutic Right trapezius, right longissimus Thoracics muscle, & right erector spinae muscle trigger point injections (03/19/2024)  Diagnostic/therapeutic right IA hip inj. x1 (12/09/2020) (100/100/95/90-95)  Therapeutic right lumbar facet RFA x3 (07/17/2024) (100/100/100/100) (low back pain is gone)  Palliative right lumbar facet MBB x2 (06/07/2019) (100/100/100 x20 days/>75 x3 months)  Diagnostic/therapeutic left lumbar facet MBB x1 (06/07/2019) (100/100/100 x20 days/>75 x3 months)  Diagnostic/therapeutic left L4-5 LESI x2 (07/25/2018) (n/a)  Diagnostic/therapeutic left L5-S1 LESI x1 (12/27/2023) (100/100/100/100) Palliative left CESI x3 (02/16/2018) (100/100/100/100)  Diagnostic right cervical facet MBB x2 (08/19/2022) (100/100/100/100) (w/ local anesthetic only)  Diagnostic left cervical facet MBB x1 (01/10/2020) (100/100/100)  Therapeutic right cervical facet RFA x1 (04/12/2023) (100/100/90/90)  Diagnostic/therapeutic right shoulder region TPI/MNB x2 (07/19/2022) (100/100/100/100)    Therapeutic  Palliative (PRN) options:   Therapeutic lumbar facet RFA     Pharmacotherapy  Nonopioids transferred 08/20/2020: Baclofen       No follow-ups on file.    Recent Visits Date Type Provider Dept  08/29/24 Office Visit Tanya Glisson, MD [facility]  07/17/24 Procedure visit Tanya Glisson, MD [facility]  Showing recent visits within past 90 days and meeting all other requirements Future Appointments Date Type Provider Dept   10/10/24 Appointment Tanya Glisson, MD [facility]  Showing future appointments within next 90 days and meeting all other requirements  I discussed the assessment and treatment plan with the patient. The patient was provided an opportunity to ask questions and all were answered. The patient agreed with the plan and demonstrated an understanding of the instructions.  Patient advised to call back or seek an in-person evaluation if the symptoms or condition worsens.  Duration of encounter: *** minutes.  Total time on encounter, as per AMA guidelines included both the face-to-face and non-face-to-face time personally spent by the physician and/or other qualified health care professional(s) on the day of the encounter (includes time in activities that require the physician or other qualified health care professional and does not include time in activities normally performed by clinical staff). Physician's time may include the following activities when performed: Preparing to see the patient (e.g., pre-charting review of records, searching for previously ordered imaging, lab work, and nerve conduction tests) Review of prior analgesic pharmacotherapies. Reviewing PMP Interpreting ordered tests (e.g., lab work, imaging, nerve conduction tests) Performing post-procedure evaluations, including interpretation of diagnostic procedures Obtaining and/or reviewing separately obtained history Performing a medically appropriate examination and/or evaluation Counseling and educating the patient/family/caregiver Ordering medications, tests, or procedures Referring and communicating with other health care professionals (when not separately reported) Documenting clinical information in the electronic or other health record Independently interpreting results (not separately reported) and communicating results to the patient/ family/caregiver Care coordination (not separately reported)  Note by:  Glisson DELENA Tanya, MD (  TTS and AI technology used. I apologize for any typographical errors that were not detected and corrected.) Date: 10/10/2024; Time: 6:17 PM

## 2024-10-10 ENCOUNTER — Encounter: Payer: Self-pay | Admitting: Pain Medicine

## 2024-10-10 ENCOUNTER — Ambulatory Visit: Attending: Pain Medicine | Admitting: Pain Medicine

## 2024-10-10 VITALS — BP 122/90 | HR 87 | Temp 97.9°F | Resp 16 | Ht 67.0 in | Wt 172.0 lb

## 2024-10-10 DIAGNOSIS — M549 Dorsalgia, unspecified: Secondary | ICD-10-CM

## 2024-10-10 DIAGNOSIS — M542 Cervicalgia: Secondary | ICD-10-CM

## 2024-10-10 DIAGNOSIS — M5442 Lumbago with sciatica, left side: Secondary | ICD-10-CM | POA: Diagnosis present

## 2024-10-10 DIAGNOSIS — G8929 Other chronic pain: Secondary | ICD-10-CM | POA: Insufficient documentation

## 2024-10-10 DIAGNOSIS — M4310 Spondylolisthesis, site unspecified: Secondary | ICD-10-CM | POA: Insufficient documentation

## 2024-10-10 DIAGNOSIS — M5417 Radiculopathy, lumbosacral region: Secondary | ICD-10-CM | POA: Insufficient documentation

## 2024-10-10 DIAGNOSIS — M25511 Pain in right shoulder: Secondary | ICD-10-CM

## 2024-10-10 DIAGNOSIS — M79605 Pain in left leg: Secondary | ICD-10-CM | POA: Insufficient documentation

## 2024-10-10 DIAGNOSIS — M7918 Myalgia, other site: Secondary | ICD-10-CM

## 2024-10-10 DIAGNOSIS — M546 Pain in thoracic spine: Secondary | ICD-10-CM

## 2024-10-10 NOTE — Patient Instructions (Signed)
 ______________________________________________________________________    Procedure instructions  Stop blood-thinners  Do not eat or drink fluids (other than water ) for 6 hours before your procedure  No water  for 2 hours before your procedure  Take your blood pressure medicine with a sip of water   Arrive 30 minutes before your appointment  If sedation is planned, bring suitable driver. [place], [place], & public transportation are NOT APPROVED)  Carefully read the Preparing for your procedure detailed instructions  If you have questions call us  at [phone]  Procedure appointments are for procedures only.   NO medication refills or new problem evaluations will be done on procedure days.   Only the scheduled, pre-approved procedure and side will be done.   ______________________________________________________________________     ______________________________________________________________________    Preparing for your procedure  Appointments: If you think you may not be able to keep your appointment, call 24-48 hours in advance to cancel. We need time to make it available to others.  Procedure visits are for procedures only. During your procedure appointment there will be: NO Prescription Refills*. NO medication changes or discussions*. NO discussion of disability issues*. NO unrelated pain problem evaluations*. NO evaluations to order other pain procedures*. *These will be addressed at a separate and distinct evaluation encounter on the provider's evaluation schedule and not during procedure days.  Instructions: Food intake: Avoid eating anything solid for at least 8 hours prior to your procedure. Clear liquid intake: You may take clear liquids such as water  up to 2 hours prior to your procedure. (No carbonated drinks. No soda.) Transportation: Unless otherwise stated by your physician, bring a driver. (Driver cannot be a Market researcher, Pharmacist, community, or any other form of public  transportation.) Morning Medicines: Except for blood thinners, take all of your other morning medications with a sip of water . Make sure to take your heart and blood pressure medicines. If your blood pressure's lower number is above 100, the case will be rescheduled. Blood thinners: Make sure to stop your blood thinners as instructed.  If you take a blood thinner, but were not instructed to stop it, call our office [phone] and ask to talk to a nurse. Not stopping a blood thinner prior to certain procedures could lead to serious complications. Diabetics on insulin : Notify the staff so that you can be scheduled 1st case in the morning. If your diabetes requires high dose insulin , take only  of your normal insulin  dose the morning of the procedure and notify the staff that you have done so. Preventing infections: Shower with an antibacterial soap the morning of your procedure.  Build-up your immune system: Take 1000 mg of Vitamin C with every meal (3 times a day) the day prior to your procedure. Antibiotics: Inform the nursing staff if you are taking any antibiotics or if you have any conditions that may require antibiotics prior to procedures. (Example: recent joint implants)   Pregnancy: If you are pregnant make sure to notify the nursing staff. Not doing so may result in injury to the fetus, including death.  Sickness: If you have a cold, fever, or any active infections, call and cancel or reschedule your procedure. Receiving steroids while having an infection may result in complications. Arrival: You must be in the facility at least 30 minutes prior to your scheduled procedure. Tardiness: Your scheduled time is also the cutoff time. If you do not arrive at least 15 minutes prior to your procedure, you will be rescheduled.  Children: Do not bring any children with  you. Make arrangements to keep them home. Dress appropriately: There is always a possibility that your clothing may get soiled. Avoid  long dresses. Valuables: Do not bring any jewelry or valuables.  Reasons to call and reschedule or cancel your procedure: (Following these recommendations will minimize the risk of a serious complication.) Surgeries: Avoid having procedures within 2 weeks of any surgery. (Avoid for 2 weeks before or after any surgery). Flu Shots: Avoid having procedures within 2 weeks of a flu shots or . (Avoid for 2 weeks before or after immunizations). Barium: Avoid having a procedure within 7-10 days after having had a radiological study involving the use of radiological contrast. (Myelograms, Barium swallow or enema study). Heart attacks: Avoid any elective procedures or surgeries for the initial 6 months after a Myocardial Infarction (Heart Attack). Blood thinners: It is imperative that you stop these medications before procedures. Let us  know if you if you take any blood thinner.  Infection: Avoid procedures during or within two weeks of an infection (including chest colds or gastrointestinal problems). Symptoms associated with infections include: Localized redness, fever, chills, night sweats or profuse sweating, burning sensation when voiding, cough, congestion, stuffiness, runny nose, sore throat, diarrhea, nausea, vomiting, cold or Flu symptoms, recent or current infections. It is specially important if the infection is over the area that we intend to treat. Heart and lung problems: Symptoms that may suggest an active cardiopulmonary problem include: cough, chest pain, breathing difficulties or shortness of breath, dizziness, ankle swelling, uncontrolled high or unusually low blood pressure, and/or palpitations. If you are experiencing any of these symptoms, cancel your procedure and contact your primary care physician for an evaluation.  Remember:  Regular Business hours are:  Monday to Thursday 8:00 AM to 4:00 PM  Provider's Schedule: Eric Como, MD:  Procedure days: Tuesday and Thursday 7:30  AM to 4:00 PM  Wallie Sherry, MD:  Procedure days: Monday and Wednesday 7:30 AM to 4:00 PM Last  Updated: 10/11/2023 ______________________________________________________________________     ______________________________________________________________________    General Risks and Possible Complications  Patient Responsibilities: It is important that you read this as it is part of your informed consent. It is our duty to inform you of the risks and possible complications associated with treatments offered to you. It is your responsibility as a patient to read this and to ask questions about anything that is not clear or that you believe was not covered in this document.  Patient's Rights: You have the right to refuse treatment. You also have the right to change your mind, even after initially having agreed to have the treatment done. However, under this last option, if you wait until the last second to change your mind, you may be charged for the materials used up to that point.  Introduction: Medicine is not an Visual merchandiser. Everything in Medicine, including the lack of treatment(s), carries the potential for danger, harm, or loss (which is by definition: Risk). In Medicine, a complication is a secondary problem, condition, or disease that can aggravate an already existing one. All treatments carry the risk of possible complications. The fact that a side effects or complications occurs, does not imply that the treatment was conducted incorrectly. It must be clearly understood that these can happen even when everything is done following the highest safety standards.  No treatment: You can choose not to proceed with the proposed treatment alternative. The "PRO(s)" would include: avoiding the risk of complications associated with the therapy. The "CON(s)" would include:  not getting any of the treatment benefits. These benefits fall under one of three categories: diagnostic; therapeutic; and/or  palliative. Diagnostic benefits include: getting information which can ultimately lead to improvement of the disease or symptom(s). Therapeutic benefits are those associated with the successful treatment of the disease. Finally, palliative benefits are those related to the decrease of the primary symptoms, without necessarily curing the condition (example: decreasing the pain from a flare-up of a chronic condition, such as incurable terminal cancer).  General Risks and Complications: These are associated to most interventional treatments. They can occur alone, or in combination. They fall under one of the following six (6) categories: no benefit or worsening of symptoms; bleeding; infection; nerve damage; allergic reactions; and/or death. No benefits or worsening of symptoms: In Medicine there are no guarantees, only probabilities. No healthcare provider can ever guarantee that a medical treatment will work, they can only state the probability that it may. Furthermore, there is always the possibility that the condition may worsen, either directly, or indirectly, as a consequence of the treatment. Bleeding: This is more common if the patient is taking a blood thinner, either prescription or over the counter (example: Goody Powders, Fish oil, Aspirin, Garlic, etc.), or if suffering a condition associated with impaired coagulation (example: Hemophilia, cirrhosis of the liver, low platelet counts, etc.). However, even if you do not have one on these, it can still happen. If you have any of these conditions, or take one of these drugs, make sure to notify your treating physician. Infection: This is more common in patients with a compromised immune system, either due to disease (example: diabetes, cancer, human immunodeficiency virus [HIV], etc.), or due to medications or treatments (example: therapies used to treat cancer and rheumatological diseases). However, even if you do not have one on these, it can still  happen. If you have any of these conditions, or take one of these drugs, make sure to notify your treating physician. Nerve Damage: This is more common when the treatment is an invasive one, but it can also happen with the use of medications, such as those used in the treatment of cancer. The damage can occur to small secondary nerves, or to large primary ones, such as those in the spinal cord and brain. This damage may be temporary or permanent and it may lead to impairments that can range from temporary numbness to permanent paralysis and/or brain death. Allergic Reactions: Any time a substance or material comes in contact with our body, there is the possibility of an allergic reaction. These can range from a mild skin rash (contact dermatitis) to a severe systemic reaction (anaphylactic reaction), which can result in death. Death: In general, any medical intervention can result in death, most of the time due to an unforeseen complication. ______________________________________________________________________      ______________________________________________________________________    Steroid injections  Common steroids for injections Triamcinolone: Used by many sports medicine physicians for large joint and bursal injections, often combined with a local anesthetic like lidocaine . A study focusing on coccydynia (tailbone pain) found triamcinolone was more effective than betamethasone , suggesting it may also be preferable for other localized inflammation conditions. Methylprednisolone: A common alternative to triamcinolone that is also a strong anti-inflammatory. It is available in different formulations, with the acetate suspension being the long-acting option for intra-articular injections. Dexamethasone : This is a non-particulate steroid, meaning it has a lower risk of tissue damage compared to particulate steroids like triamcinolone and methylprednisolone. While less common for this specific  use,  it is an option for targeted injections.   Considerations for physicians Particulate vs. non-particulate steroids: Triamcinolone and methylprednisolone are particulate, meaning they can clump together. Dexamethasone  is non-particulate. Particulate steroids are often preferred for their longer-lasting effects but carry a theoretical higher risk for certain injections (though this is less of a concern in the costochondral joints). Combined injectate: Corticosteroids are typically mixed with a local anesthetic like lidocaine  to provide both immediate pain relief (from the anesthetic) and longer-term inflammation reduction (from the steroid). Imaging guidance: To ensure accurate placement of the needle and medication, physicians may use ultrasound or fluoroscopic guidance for the injection, especially in complex or refractory cases.   Patient guidance Before undergoing a steroid injection, discuss the options with your physician. They will determine the best steroid, dosage, and procedure for your specific case based on factors like: Severity of your condition History of response to other treatments Your overall health status Experience and preference of the physician  Last  Updated: 06/26/2024 ______________________________________________________________________

## 2024-10-10 NOTE — Progress Notes (Signed)
 Safety precautions to be maintained throughout the outpatient stay will include: orient to surroundings, keep bed in low position, maintain call bell within reach at all times, provide assistance with transfer out of bed and ambulation.

## 2024-10-12 ENCOUNTER — Other Ambulatory Visit: Payer: Self-pay | Admitting: Nurse Practitioner

## 2024-10-15 NOTE — Telephone Encounter (Signed)
 Requested medications are due for refill today.  yes  Requested medications are on the active medications list.  yes  Last refill. 04/03/2024 #180 1 rf  Future visit scheduled.   yes  Notes to clinic.  Refill not delegated.    Requested Prescriptions  Pending Prescriptions Disp Refills   ondansetron  (ZOFRAN ) 8 MG tablet [Pharmacy Med Name: ONDANSETRON  HCL 8 MG TABLET] 180 tablet 1    Sig: Take 1 tablet (8 mg total) by mouth 2 (two) times daily.     Not Delegated - Gastroenterology: Antiemetics - ondansetron  Failed - 10/15/2024  2:00 PM      Failed - This refill cannot be delegated      Failed - Valid encounter within last 6 months    Recent Outpatient Visits           6 months ago Annual physical exam   [facility] Melvin Pao, NP              Passed - AST in normal range and within 360 days    AST  Date Value Ref Range Status  04/03/2024 28 0 - 40 IU/L Final         Passed - ALT in normal range and within 360 days    ALT  Date Value Ref Range Status  04/03/2024 35 0 - 44 IU/L Final

## 2024-10-18 ENCOUNTER — Ambulatory Visit: Admission: RE | Admit: 2024-10-18 | Source: Ambulatory Visit

## 2024-10-18 ENCOUNTER — Encounter: Payer: Self-pay | Admitting: Pain Medicine

## 2024-10-18 ENCOUNTER — Ambulatory Visit: Admitting: Pain Medicine

## 2024-10-18 VITALS — BP 134/93 | HR 64 | Temp 97.8°F | Resp 15 | Ht 67.0 in | Wt 172.0 lb

## 2024-10-18 DIAGNOSIS — M545 Low back pain, unspecified: Secondary | ICD-10-CM

## 2024-10-18 DIAGNOSIS — M5442 Lumbago with sciatica, left side: Secondary | ICD-10-CM

## 2024-10-18 DIAGNOSIS — M79605 Pain in left leg: Secondary | ICD-10-CM | POA: Diagnosis present

## 2024-10-18 DIAGNOSIS — M5416 Radiculopathy, lumbar region: Secondary | ICD-10-CM | POA: Insufficient documentation

## 2024-10-18 DIAGNOSIS — M4310 Spondylolisthesis, site unspecified: Secondary | ICD-10-CM | POA: Insufficient documentation

## 2024-10-18 DIAGNOSIS — G8929 Other chronic pain: Secondary | ICD-10-CM

## 2024-10-18 DIAGNOSIS — M5386 Other specified dorsopathies, lumbar region: Secondary | ICD-10-CM | POA: Insufficient documentation

## 2024-10-18 DIAGNOSIS — M5417 Radiculopathy, lumbosacral region: Secondary | ICD-10-CM

## 2024-10-18 MED ORDER — TRIAMCINOLONE ACETONIDE 40 MG/ML IJ SUSP
40.0000 mg | Freq: Once | INTRAMUSCULAR | Status: AC
  Administered 2024-10-18: 16:00:00 40 mg
  Filled 2024-10-18: qty 1

## 2024-10-18 MED ORDER — SODIUM CHLORIDE 0.9% FLUSH
2.0000 mL | Freq: Once | INTRAVENOUS | Status: AC
  Administered 2024-10-18: 16:00:00 2 mL

## 2024-10-18 MED ORDER — IOHEXOL 180 MG/ML  SOLN
10.0000 mL | Freq: Once | INTRAMUSCULAR | Status: AC
  Administered 2024-10-18: 16:00:00 10 mL via EPIDURAL
  Filled 2024-10-18: qty 20

## 2024-10-18 MED ORDER — ROPIVACAINE HCL 2 MG/ML IJ SOLN
2.0000 mL | Freq: Once | INTRAMUSCULAR | Status: AC
  Administered 2024-10-18: 16:00:00 2 mL via EPIDURAL
  Filled 2024-10-18: qty 20

## 2024-10-18 MED ORDER — LIDOCAINE HCL 2 % IJ SOLN
20.0000 mL | Freq: Once | INTRAMUSCULAR | Status: AC
  Administered 2024-10-18: 16:00:00 100 mg
  Filled 2024-10-18: qty 40

## 2024-10-18 MED ORDER — PENTAFLUOROPROP-TETRAFLUOROETH EX AERO
INHALATION_SPRAY | Freq: Once | CUTANEOUS | Status: AC
  Administered 2024-10-18: 16:00:00 30 via TOPICAL

## 2024-10-18 NOTE — Progress Notes (Signed)
 PROVIDER NOTE: Interpretation of information contained herein should be left to medically-trained personnel. Specific patient instructions are provided elsewhere under Patient Instructions section of medical record. This document was created in part using STT-dictation technology, any transcriptional errors that may result from this process are unintentional.  Patient: Jose Washington Type: Established DOB: 12/26/1965 MRN: [id number] PCP: Melvin Pao, NP  Service: Procedure DOS: 10/18/2024 Setting: Ambulatory Location: Ambulatory outpatient facility Delivery: Face-to-face Provider: Eric DELENA Como, MD Specialty: Interventional Pain Management Specialty designation: 09 Location: Outpatient facility Ref. Prov.: Melvin Pao, NP       Interventional Therapy   Type: Lumbar epidural steroid injection (LESI) (interlaminar) #2    Laterality: Left   Level:  L5-S1 Level.  Imaging: Fluoroscopic guidance Spinal ([id number]) Anesthesia: Local anesthesia (1-2% Lidocaine ) Anxiolysis: None Declined                    Sedation: No Sedation                       DOS: 10/18/2024  Performed by: Eric DELENA Como, MD  Purpose: Diagnostic/Therapeutic Indications: Lumbar radicular pain of intraspinal etiology of more than 4 weeks that has failed to respond to conservative therapy and is severe enough to impact quality of life or function. 1. Chronic low back pain (Left) w/ sciatica (Left)   2. Chronic lower extremity pain (2ry area of Pain) (Left)   3. Lumbar radiculitis (Left)   4. Lumbar radiculopathy (Left)   5. Lumbosacral radiculopathy at S1 (Left)   6. Sciatica associated with disorder of lumbar spine   7. Grade 1 Retrolisthesis of L3 over L4   8. Intermittent low back pain    NAS-11 Pain score:   Pre-procedure: 9 /10   Post-procedure: 1 /10      Position / Prep / Materials:  Position: Prone w/ head of the table raised (slight reverse trendelenburg) to facilitate  breathing.  Prep solution: ChloraPrep (2% chlorhexidine gluconate and 70% isopropyl alcohol) Prep Area: Entire Posterior Lumbar Region from lower scapular tip down to mid buttocks area and from flank to flank. Materials:  Tray: Epidural tray Needle(s):  Type: Epidural needle (Tuohy) Gauge (G):  17 Length: Regular (3.5-in) Qty: 1  H&P (Pre-op Assessment):  Jose Washington is a 58 y.o. (year old), male patient, seen today for interventional treatment. He  has a past surgical history that includes eye muscle repair (1972 and 1975); bLEEDING ULCER (2012); Flexible sigmoidoscopy; Colonoscopy with propofol  (N/A, 03/24/2021); and Esophagogastroduodenoscopy (egd) with propofol  (N/A, 03/24/2021). Jose Washington has a current medication list which includes the following prescription(s): acetaminophen , prevagen extra strength, atenolol , atorvastatin , baclofen , buspirone , calcium  carbonate, vitamin d3, desloratadine-pseudoephedrine, diphenhydramine hcl, docusate sodium, duloxetine , elderberry, fluticasone, fluticasone propionate (inhal), glucosamine hcl, hydroxyzine , ibuprofen, levalbuterol , magnesium oxide, melatonin, methylsulfonylmethane, multivitamin, omeprazole , ondansetron , potassium chloride er, simethicone , sucralfate , cyanocobalamin , and vitamin e. His primarily concern today is the Back Pain (Left, lower)  Initial Vital Signs:  Pulse/HCG Rate: 64ECG Heart Rate: 60 Temp: 97.8 F (36.6 C) Resp: 16 BP: 128/87 SpO2: 99 %  BMI: Estimated body mass index is 26.94 kg/m as calculated from the following:   Height as of this encounter: 5' 7 (1.702 m).   Weight as of this encounter: 172 lb (78 kg).  Risk Assessment: Allergies: Reviewed. He has no known allergies.  Allergy Precautions: None required Coagulopathies: Reviewed. None identified.  Blood-thinner therapy: None at this time Active Infection(s): Reviewed. None identified. Mr. Sem is afebrile  Site Confirmation:  Jose Washington was asked to  confirm the procedure and laterality before marking the site Procedure checklist: Completed Consent: Before the procedure and under the influence of no sedative(s), amnesic(s), or anxiolytics, the patient was informed of the treatment options, risks and possible complications. To fulfill our ethical and legal obligations, as recommended by the American Medical Association's Code of Ethics, I have informed the patient of my clinical impression; the nature and purpose of the treatment or procedure; the risks, benefits, and possible complications of the intervention; the alternatives, including doing nothing; the risk(s) and benefit(s) of the alternative treatment(s) or procedure(s); and the risk(s) and benefit(s) of doing nothing. The patient was provided information about the general risks and possible complications associated with the procedure. These may include, but are not limited to: failure to achieve desired goals, infection, bleeding, organ or nerve damage, allergic reactions, paralysis, and death. In addition, the patient was informed of those risks and complications associated to Spine-related procedures, such as failure to decrease pain; infection (i.e.: Meningitis, epidural or intraspinal abscess); bleeding (i.e.: epidural hematoma, subarachnoid hemorrhage, or any other type of intraspinal or peri-dural bleeding); organ or nerve damage (i.e.: Any type of peripheral nerve, nerve root, or spinal cord injury) with subsequent damage to sensory, motor, and/or autonomic systems, resulting in permanent pain, numbness, and/or weakness of one or several areas of the body; allergic reactions; (i.e.: anaphylactic reaction); and/or death. Furthermore, the patient was informed of those risks and complications associated with the medications. These include, but are not limited to: allergic reactions (i.e.: anaphylactic or anaphylactoid reaction(s)); adrenal axis suppression; blood sugar elevation that in diabetics  may result in ketoacidosis or comma; water retention that in patients with history of congestive heart failure may result in shortness of breath, pulmonary edema, and decompensation with resultant heart failure; weight gain; swelling or edema; medication-induced neural toxicity; particulate matter embolism and blood vessel occlusion with resultant organ, and/or nervous system infarction; and/or aseptic necrosis of one or more joints. Finally, the patient was informed that Medicine is not an exact science; therefore, there is also the possibility of unforeseen or unpredictable risks and/or possible complications that may result in a catastrophic outcome. The patient indicated having understood very clearly. We have given the patient no guarantees and we have made no promises. Enough time was given to the patient to ask questions, all of which were answered to the patient's satisfaction. Mr. Nou has indicated that he wanted to continue with the procedure. Attestation: I, the ordering provider, attest that I have discussed with the patient the benefits, risks, side-effects, alternatives, likelihood of achieving goals, and potential problems during recovery for the procedure that I have provided informed consent. Date  Time: 10/18/2024  2:08 PM  Pre-Procedure Preparation:  Monitoring: As per clinic protocol. Respiration, ETCO2, SpO2, BP, heart rate and rhythm monitor placed and checked for adequate function Safety Precautions: Patient was assessed for positional comfort and pressure points before starting the procedure. Time-out: I initiated and conducted the Time-out before starting the procedure, as per protocol. The patient was asked to participate by confirming the accuracy of the Time Out information. Verification of the correct person, site, and procedure were performed and confirmed by me, the nursing staff, and the patient. Time-out conducted as per Joint Commission's Universal Protocol  (UP.01.01.01). Time: 1535 Start Time: 1535 hrs.  Description/Narrative of Procedure:          Target: Epidural space via interlaminar opening, initially targeting the lower laminar border of the superior vertebral  body. Region: Lumbar Approach: Percutaneous paravertebral  Rationale (medical necessity): procedure needed and proper for the diagnosis and/or treatment of the patient's medical symptoms and needs. Procedural Technique Safety Precautions: Aspiration looking for blood return was conducted prior to all injections. At no point did we inject any substances, as a needle was being advanced. No attempts were made at seeking any paresthesias. Safe injection practices and needle disposal techniques used. Medications properly checked for expiration dates. SDV (single dose vial) medications used. Description of the Procedure: Protocol guidelines were followed. The procedure needle was introduced through the skin, ipsilateral to the reported pain, and advanced to the target area. Bone was contacted and the needle walked caudad, until the lamina was cleared. The epidural space was identified using loss-of-resistance technique with 2-3 ml of PF-NaCl (0.9% NSS), in a 5cc LOR glass syringe.  Vitals:   10/18/24 1408 10/18/24 1535 10/18/24 1541  BP: 128/87 (!) 150/104 (!) 134/93  Pulse: 64    Resp: 16 18 15   Temp: 97.8 F (36.6 C)    TempSrc: Temporal    SpO2: 99% 98% 100%  Weight: 172 lb (78 kg)    Height: 5' 7 (1.702 m)      Start Time: 1535 hrs. End Time: 1539 hrs.  Imaging Guidance (Spinal):          Type of Imaging Technique: Fluoroscopy Guidance (Spinal) Indication(s): Fluoroscopy guidance for needle placement to enhance accuracy in procedures requiring precise needle localization for targeted delivery of medication in or near specific anatomical locations not easily accessible without such real-time imaging assistance. Exposure Time: Please see nurses notes. Contrast: Before  injecting any contrast, we confirmed that the patient did not have an allergy to iodine, shellfish, or radiological contrast. Once satisfactory needle placement was completed at the desired level, radiological contrast was injected. Contrast injected under live fluoroscopy. No contrast complications. See chart for type and volume of contrast used. Fluoroscopic Guidance: I was personally present during the use of fluoroscopy. Tunnel Vision Technique used to obtain the best possible view of the target area. Parallax error corrected before commencing the procedure. Direction-depth-direction technique used to introduce the needle under continuous pulsed fluoroscopy. Once target was reached, antero-posterior, oblique, and lateral fluoroscopic projection used confirm needle placement in all planes. Images permanently stored in EMR. Interpretation: I personally interpreted the imaging intraoperatively. Adequate needle placement confirmed in multiple planes. Appropriate spread of contrast into desired area was observed. No evidence of afferent or efferent intravascular uptake. No intrathecal or subarachnoid spread observed. Permanent images saved into the patient's record.  Antibiotic Prophylaxis:   Anti-infectives (From admission, onward)    None      Indication(s): None identified  Post-operative Assessment:  Post-procedure Vital Signs:  Pulse/HCG Rate: 6461 Temp: 97.8 F (36.6 C) Resp: 15 BP: (!) 134/93 SpO2: 100 %  EBL: None  Complications: No immediate post-treatment complications observed by team, or reported by patient.  Note: The patient tolerated the entire procedure well. A repeat set of vitals were taken after the procedure and the patient was kept under observation following institutional policy, for this type of procedure. Post-procedural neurological assessment was performed, showing return to baseline, prior to discharge. The patient was provided with post-procedure discharge  instructions, including a section on how to identify potential problems. Should any problems arise concerning this procedure, the patient was given instructions to immediately contact us , at any time, without hesitation. In any case, we plan to contact the patient by telephone for a follow-up status report  regarding this interventional procedure.  Comments:  No additional relevant information.  Plan of Care (POC)  Orders:  Orders Placed This Encounter  Procedures   Lumbar Epidural Injection    Scheduling Instructions:     Procedure: Interlaminar LESI L5-S1     Laterality: Left     Sedation: No Sedation     Date: 10/18/2024    Where will this procedure be performed?:   [facility]   [facility] C-ARM 1-60 MIN NO REPORT    Intraoperative interpretation by procedural physician at [facility].    Standing Status:   Standing    Number of Occurrences:   1    Reason for exam::   Assistance in needle guidance and placement for procedures requiring needle placement in or near specific anatomical locations not easily accessible without such assistance.   Informed Consent Details: Physician/Practitioner Attestation; Transcribe to consent form and obtain patient signature    Note: Always confirm laterality of pain with Mr. Chalfant, before procedure. Transcribe to consent form and obtain patient signature.    Physician/Practitioner attestation of informed consent for procedure/surgical case:   I, the physician/practitioner, attest that I have discussed with the patient the benefits, risks, side effects, alternatives, likelihood of achieving goals and potential problems during recovery for the procedure that I have provided informed consent.    Procedure:   Lumbar epidural steroid injection under fluoroscopic guidance    Physician/Practitioner performing the procedure:   Elantra Caprara A. Tanya, MD    Indication/Reason:   Low back and/or lower extremity pain secondary to lumbar  radiculitis   Provide equipment / supplies at bedside    Procedural tray: Epidural Tray (Disposable  single use) Skin infiltration needle: Regular 1.5-in, 25-G, (x1) Block needle size: Regular standard Catheter: No catheter required    Standing Status:   Standing    Number of Occurrences:   1    Specify:   Epidural Tray     Opioid Analgesic: No chronic opioid analgesics therapy prescribed by our practice. Hydrocodone /APAP 5/325, 1 tab PO QD (5 mg/day of hydrocodone ) MME/day: 5 mg/day.    Medications ordered for procedure: Meds ordered this encounter  Medications   iohexol  (OMNIPAQUE ) 180 MG/ML injection 10 mL    Must be Myelogram-compatible. If not available, you may substitute with a water-soluble, non-ionic, hypoallergenic, myelogram-compatible radiological contrast medium.   lidocaine  (XYLOCAINE ) 2 % (with pres) injection 400 mg   pentafluoroprop-tetrafluoroeth (GEBAUERS) aerosol   sodium chloride  flush (NS) 0.9 % injection 2 mL   ropivacaine  (PF) 2 mg/mL (0.2%) (NAROPIN ) injection 2 mL   triamcinolone  acetonide (KENALOG -40) injection 40 mg   Medications administered: We administered iohexol , lidocaine , pentafluoroprop-tetrafluoroeth, sodium chloride  flush, ropivacaine  (PF) 2 mg/mL (0.2%), and triamcinolone  acetonide.  See the medical record for exact dosing, route, and time of administration.    Interventional Therapies  Risk Factors  Considerations:   Abnormal UDS (03/27/2020) & (08/20/20) (+) undisclosed carboxy-THC    Planned  Pending:   Therapeutic left L5-S1 LESI #2  Referral to physical therapy for a trial to assist with the left lower extremity pain.   Under consideration:   Therapeutic left L5-S1 LESI #2  Diagnostic/therapeutic left lumbar facet MBB #2 with possible RFA follow-up  Diagnostic/therapeutic left IA hip joint inj. #1    Completed:   Therapeutic Right trapezius, right longissimus Thoracics muscle, & right erector spinae muscle trigger point  injections (03/19/2024)  Diagnostic/therapeutic right IA hip inj. x1 (12/09/2020) (100/100/95/90-95)  Therapeutic right lumbar facet  RFA x3 (07/17/2024) (100/100/100/100) (low back pain is gone)  Palliative right lumbar facet MBB x2 (06/07/2019) (100/100/100 x20 days/>75 x3 months)  Diagnostic/therapeutic left lumbar facet MBB x1 (06/07/2019) (100/100/100 x20 days/>75 x3 months)  Diagnostic/therapeutic left L4-5 LESI x2 (07/25/2018) (n/a)  Diagnostic/therapeutic left L5-S1 LESI x1 (12/27/2023) (100/100/100/100)  Palliative left CESI x3 (02/16/2018) (100/100/100/100)  Diagnostic right cervical facet MBB x2 (08/19/2022) (100/100/100/100) (w/ local anesthetic only)  Diagnostic left cervical facet MBB x1 (01/10/2020) (100/100/100)  Therapeutic right cervical facet RFA x1 (04/12/2023) (100/100/90/90)  Diagnostic/therapeutic right shoulder region TPI/MNB x2 (07/19/2022) (100/100/100/100)    Therapeutic  Palliative (PRN) options:   Therapeutic lumbar facet RFA     Pharmacotherapy  Nonopioids transferred 08/20/2020: Baclofen        Follow-up plan:   Return in about 2 weeks (around 11/01/2024) for (Face2F), (PPE).     Recent Visits Date Type Provider Dept  10/10/24 Office Visit Tanya Glisson, MD [facility]  08/29/24 Office Visit Tanya Glisson, MD [facility]  Showing recent visits within past 90 days and meeting all other requirements Today's Visits Date Type Provider Dept  10/18/24 Procedure visit Tanya Glisson, MD [facility]  Showing today's visits and meeting all other requirements Future Appointments Date Type Provider Dept  11/21/24 Appointment Tanya Glisson, MD [facility]  Showing future appointments within next 90 days and meeting all other requirements   Disposition: Discharge home  Discharge (Date  Time): 10/18/2024; 1555 hrs.   Primary Care Physician: Melvin Pao, NP Location: [facility]  Facility Note by: Glisson DELENA Tanya, MD (TTS technology used. I apologize for any typographical errors that were not detected and corrected.) Date: 10/18/2024; Time: 4:01 PM  Disclaimer:  Medicine is not an visual merchandiser. The only guarantee in medicine is that nothing is guaranteed. It is important to note that the decision to proceed with this intervention was based on the information collected from the patient. The Data and conclusions were drawn from the patient's questionnaire, the interview, and the physical examination. Because the information was provided in large part by the patient, it cannot be guaranteed that it has not been purposely or unconsciously manipulated. Every effort has been made to obtain as much relevant data as possible for this evaluation. It is important to note that the conclusions that lead to this procedure are derived in large part from the available data. Always take into account that the treatment will also be dependent on availability of resources and existing treatment guidelines, considered by other Pain Management Practitioners as being common knowledge and practice, at the time of the intervention. For Medico-Legal purposes, it is also important to point out that variation in procedural techniques and pharmacological choices are the acceptable norm. The indications, contraindications, technique, and results of the above procedure should only be interpreted and judged by a Board-Certified Interventional Pain Specialist with extensive familiarity and expertise in the same exact procedure and technique.

## 2024-10-18 NOTE — Patient Instructions (Signed)
 ______________________________________________________________________    Post-Procedure Discharge Instructions  INSTRUCTIONS Apply ice:  Purpose: This will minimize any swelling and discomfort after procedure.  When: Day of procedure, as soon as you get home. How: Fill a plastic sandwich bag with crushed ice. Cover it with a small towel and apply to injection site. How long: (15 min on, 15 min off) Apply for 15 minutes then remove x 15 minutes.  Repeat sequence on day of procedure, until you go to bed. Apply heat:  Purpose: To treat any soreness and discomfort from the procedure. When: Starting the next day after the procedure. How: Apply heat to procedure site starting the day following the procedure. How long: May continue to repeat daily, until discomfort goes away. Food intake: Start with clear liquids (like water) and advance to regular food, as tolerated.  Physical activities: Keep activities to a minimum for the first 8 hours after the procedure. After that, then as tolerated. Driving: If you have received any sedation, be responsible and do not drive. You are not allowed to drive for 24 hours after having sedation. Blood thinner: (Applies only to those taking blood thinners) You may restart your blood thinner 6 hours after your procedure. Insulin: (Applies only to Diabetic patients taking insulin) As soon as you can eat, you may resume your normal dosing schedule. Infection prevention: Keep procedure site clean and dry. Shower daily and clean area with soap and water.  PAIN DIARY Post-procedure Pain Diary: Extremely important that this be done correctly and accurately. Recorded information will be used to determine the next step in treatment. For the purpose of accuracy, follow these rules: Evaluate only the area treated. Do not report or include pain from an untreated area. For the purpose of this evaluation, ignore all other areas of pain, except for the treated area. After your  procedure, avoid taking a long nap and attempting to complete the pain diary after you wake up. Instead, set your alarm clock to go off every hour, on the hour, for the initial 8 hours after the procedure. Document the duration of the numbing medicine, and the relief you are getting from it. Do not go to sleep and attempt to complete it later. It will not be accurate. If you received sedation, it is likely that you were given a medication that may cause amnesia. Because of this, completing the diary at a later time may cause the information to be inaccurate. This information is needed to plan your care. Follow-up appointment: Keep your post-procedure follow-up evaluation appointment after the procedure (usually 2 weeks for most procedures, 6 weeks for radiofrequencies). DO NOT FORGET to bring you pain diary with you.   EXPECT... (What should I expect to see with my procedure?) From numbing medicine (AKA: Local Anesthetics): Numbness or decrease in pain. You may also experience some weakness, which if present, could last for the duration of the local anesthetic. Onset: Full effect within 15 minutes of injected. Duration: It will depend on the type of local anesthetic used. On the average, 1 to 8 hours.  From steroids (Applies only if steroids were used): Decrease in swelling or inflammation. Once inflammation is improved, relief of the pain will follow. Onset of benefits: Depends on the amount of swelling present. The more swelling, the longer it will take for the benefits to be seen. In some cases, up to 10 days. Duration: Steroids will stay in the system x 2 weeks. Duration of benefits will depend on multiple posibilities including persistent irritating  factors. Side-effects: If present, they may typically last 2 weeks (the duration of the steroids). Frequent: Cramps (if they occur, drink Gatorade and take over-the-counter Magnesium 450-500 mg once to twice a day); water retention with temporary weight  gain; increases in blood sugar; decreased immune system response; increased appetite. Occasional: Facial flushing (red, warm cheeks); mood swings; menstrual changes. Uncommon: Long-term decrease or suppression of natural hormones; bone thinning. (These are more common with higher doses or more frequent use. This is why we prefer that our patients avoid having any injection therapies in other practices.)  Very Rare: Severe mood changes; psychosis; aseptic necrosis. From procedure: Some discomfort is to be expected once the numbing medicine wears off. This should be minimal if ice and heat are applied as instructed.  CALL IF... (When should I call?) You experience numbness and weakness that gets worse with time, as opposed to wearing off. New onset bowel or bladder incontinence. (Applies only to procedures done in the spine)  Emergency Numbers: Durning business hours (Monday - Thursday, 8:00 AM - 4:00 PM) (Friday, 9:00 AM - 12:00 Noon): [phone] After hours: [phone] NOTE: If you are having a problem and are unable connect with, or to talk to a provider, then go to your nearest urgent care or emergency department. If the problem is serious and urgent, please call 911. ______________________________________________________________________     ______________________________________________________________________    Steroid injections  Common steroids for injections Triamcinolone : Used by many sports medicine physicians for large joint and bursal injections, often combined with a local anesthetic like lidocaine . A study focusing on coccydynia (tailbone pain) found triamcinolone  was more effective than betamethasone, suggesting it may also be preferable for other localized inflammation conditions. Methylprednisolone: A common alternative to triamcinolone  that is also a strong anti-inflammatory. It is available in different formulations, with the acetate suspension being the long-acting  option for intra-articular injections. Dexamethasone : This is a non-particulate steroid, meaning it has a lower risk of tissue damage compared to particulate steroids like triamcinolone  and methylprednisolone. While less common for this specific use, it is an option for targeted injections.   Considerations for physicians Particulate vs. non-particulate steroids: Triamcinolone  and methylprednisolone are particulate, meaning they can clump together. Dexamethasone  is non-particulate. Particulate steroids are often preferred for their longer-lasting effects but carry a theoretical higher risk for certain injections (though this is less of a concern in the costochondral joints). Combined injectate: Corticosteroids are typically mixed with a local anesthetic like lidocaine  to provide both immediate pain relief (from the anesthetic) and longer-term inflammation reduction (from the steroid). Imaging guidance: To ensure accurate placement of the needle and medication, physicians may use ultrasound or fluoroscopic guidance for the injection, especially in complex or refractory cases.   Patient guidance Before undergoing a steroid injection, discuss the options with your physician. They will determine the best steroid, dosage, and procedure for your specific case based on factors like: Severity of your condition History of response to other treatments Your overall health status Experience and preference of the physician  Last  Updated: 06/26/2024 ______________________________________________________________________

## 2024-11-01 ENCOUNTER — Other Ambulatory Visit: Payer: Self-pay | Admitting: Nurse Practitioner

## 2024-11-01 DIAGNOSIS — G8929 Other chronic pain: Secondary | ICD-10-CM

## 2024-11-07 ENCOUNTER — Ambulatory Visit: Admitting: Nurse Practitioner

## 2024-11-07 ENCOUNTER — Encounter: Payer: Self-pay | Admitting: Nurse Practitioner

## 2024-11-07 VITALS — BP 135/89 | HR 59 | Temp 97.8°F | Ht 67.01 in | Wt 177.6 lb

## 2024-11-07 DIAGNOSIS — Z7984 Long term (current) use of oral hypoglycemic drugs: Secondary | ICD-10-CM

## 2024-11-07 DIAGNOSIS — E119 Type 2 diabetes mellitus without complications: Secondary | ICD-10-CM | POA: Insufficient documentation

## 2024-11-07 DIAGNOSIS — M5459 Other low back pain: Secondary | ICD-10-CM

## 2024-11-07 LAB — MICROALBUMIN, URINE WAIVED
Creatinine, Urine Waived: 300 mg/dL (ref 10–300)
Microalb, Ur Waived: 80 mg/L — ABNORMAL HIGH (ref 0–19)

## 2024-11-07 MED ORDER — CELECOXIB 100 MG PO CAPS: 100.0000 mg | ORAL_CAPSULE | Freq: Two times a day (BID) | ORAL | 0 refills | Status: AC

## 2024-11-07 NOTE — Assessment & Plan Note (Addendum)
 Chronic concern.  Will start Celebrex  while patient is waiting to get surgery.  He is working with PT.  Understands that NSAIDS and Celebrex  can harm the kidneys.  Plans for this to be a short term solution. Do not use with NSAIDS.

## 2024-11-07 NOTE — Progress Notes (Signed)
 "  BP 135/89 (BP Location: Left Arm, Patient Position: Sitting, Cuff Size: Normal)   Pulse (!) 59   Temp 97.8 F (36.6 C) (Oral)   Ht 5' 7.01 (1.702 m)   Wt 177 lb 9.6 oz (80.6 kg)   SpO2 99%   BMI 27.81 kg/m    Subjective:    Patient ID: Jose Washington, male    DOB: 04/30/1966, 59 y.o.   MRN: [id number]  HPI: Jose Washington is a 59 y.o. male  Chief Complaint  Patient presents with   office visit    Sciatica pain. Patient wants to talk about Meloxicam or Celebrex .    BACK PAIN Worsened last year.  Severe pain in left leg. Did PT and steroid injection in the back.  Came back in [place].  He is currently in PT.  Still having numbness, tingling and weakness in the left side.  He does plan to have surgery soon.  In the meantime, pain management has said they can't prescribe medications outside of controlled substances.  Has DDD C6-7.  He does have severe stenosis in the lumbar spine.  He understands that meloxicam could cause kidney damage.   Duration: since 2018   Relevant past medical, surgical, family and social history reviewed and updated as indicated. Interim medical history since our last visit reviewed. Allergies and medications reviewed and updated.  Review of Systems  Musculoskeletal:  Positive for back pain.    Per HPI unless specifically indicated above     Objective:    BP 135/89 (BP Location: Left Arm, Patient Position: Sitting, Cuff Size: Normal)   Pulse (!) 59   Temp 97.8 F (36.6 C) (Oral)   Ht 5' 7.01 (1.702 m)   Wt 177 lb 9.6 oz (80.6 kg)   SpO2 99%   BMI 27.81 kg/m   Wt Readings from Last 3 Encounters:  11/07/24 177 lb 9.6 oz (80.6 kg)  10/18/24 172 lb (78 kg)  10/10/24 172 lb (78 kg)    Physical Exam Vitals and nursing note reviewed.  Constitutional:      General: He is not in acute distress.    Appearance: Normal appearance. He is not ill-appearing, toxic-appearing or diaphoretic.  HENT:     Head: Normocephalic.     Right  Ear: External ear normal.     Left Ear: External ear normal.     Nose: Nose normal. No congestion or rhinorrhea.     Mouth/Throat:     Mouth: Mucous membranes are moist.  Eyes:     General:        Right eye: No discharge.        Left eye: No discharge.     Extraocular Movements: Extraocular movements intact.     Conjunctiva/sclera: Conjunctivae normal.     Pupils: Pupils are equal, round, and reactive to light.  Cardiovascular:     Rate and Rhythm: Normal rate and regular rhythm.     Heart sounds: No murmur heard. Pulmonary:     Effort: Pulmonary effort is normal. No respiratory distress.     Breath sounds: Normal breath sounds. No wheezing, rhonchi or rales.  Abdominal:     General: Abdomen is flat. Bowel sounds are normal.  Musculoskeletal:     Cervical back: Normal range of motion and neck supple.  Skin:    General: Skin is warm and dry.     Capillary Refill: Capillary refill takes less than 2 seconds.  Neurological:     General: No focal  deficit present.     Mental Status: He is alert and oriented to person, place, and time.  Psychiatric:        Mood and Affect: Mood normal.        Behavior: Behavior normal.        Thought Content: Thought content normal.        Judgment: Judgment normal.     Results for orders placed or performed in visit on 04/03/24  TSH   Collection Time: 04/03/24 10:16 AM  Result Value Ref Range   TSH 1.700 0.450 - 4.500 uIU/mL  PSA   Collection Time: 04/03/24 10:16 AM  Result Value Ref Range   Prostate Specific Ag, Serum 0.5 0.0 - 4.0 ng/mL  Lipid panel   Collection Time: 04/03/24 10:16 AM  Result Value Ref Range   Cholesterol, Total 177 100 - 199 mg/dL   Triglycerides 858 0 - 149 mg/dL   HDL 61 >60 mg/dL   VLDL Cholesterol Cal 24 5 - 40 mg/dL   LDL Chol Calc (NIH) 92 0 - 99 mg/dL   Chol/HDL Ratio 2.9 0.0 - 5.0 ratio  CBC with Differential/Platelet   Collection Time: 04/03/24 10:16 AM  Result Value Ref Range   WBC 9.9 3.4 - 10.8  x10E3/uL   RBC 4.71 4.14 - 5.80 x10E6/uL   Hemoglobin 14.4 13.0 - 17.7 g/dL   Hematocrit 55.8 62.4 - 51.0 %   MCV 94 79 - 97 fL   MCH 30.6 26.6 - 33.0 pg   MCHC 32.7 31.5 - 35.7 g/dL   RDW 86.2 88.3 - 84.5 %   Platelets 297 150 - 450 x10E3/uL   Neutrophils 71 Not Estab. %   Lymphs 20 Not Estab. %   Monocytes 6 Not Estab. %   Eos 1 Not Estab. %   Basos 1 Not Estab. %   Neutrophils Absolute 7.0 1.4 - 7.0 x10E3/uL   Lymphocytes Absolute 2.0 0.7 - 3.1 x10E3/uL   Monocytes Absolute 0.6 0.1 - 0.9 x10E3/uL   EOS (ABSOLUTE) 0.1 0.0 - 0.4 x10E3/uL   Basophils Absolute 0.1 0.0 - 0.2 x10E3/uL   Immature Granulocytes 1 Not Estab. %   Immature Grans (Abs) 0.1 0.0 - 0.1 x10E3/uL  Comprehensive metabolic panel with GFR   Collection Time: 04/03/24 10:16 AM  Result Value Ref Range   Glucose 118 (H) 70 - 99 mg/dL   BUN 13 6 - 24 mg/dL   Creatinine, Ser 9.12 0.76 - 1.27 mg/dL   eGFR 898 >40 fO/fpw/8.26   BUN/Creatinine Ratio 15 9 - 20   Sodium 137 134 - 144 mmol/L   Potassium 4.6 3.5 - 5.2 mmol/L   Chloride 104 96 - 106 mmol/L   CO2 19 (L) 20 - 29 mmol/L   Calcium  9.5 8.7 - 10.2 mg/dL   Total Protein 6.4 6.0 - 8.5 g/dL   Albumin 4.4 3.8 - 4.9 g/dL   Globulin, Total 2.0 1.5 - 4.5 g/dL   Bilirubin Total 0.5 0.0 - 1.2 mg/dL   Alkaline Phosphatase 57 44 - 121 IU/L   AST 28 0 - 40 IU/L   ALT 35 0 - 44 IU/L  HgB A1c   Collection Time: 04/03/24 10:16 AM  Result Value Ref Range   Hgb A1c MFr Bld 7.0 (H) 4.8 - 5.6 %   Est. average glucose Bld gHb Est-mCnc 154 mg/dL      Assessment & Plan:   Problem List Items Addressed This Visit       Endocrine  Diabetes mellitus treated with oral medication (HCC) - Primary   New diagnosis in June. Will recheck A1c today.  Will follow up next week and discuss results.  He has cut back on his soda use and cutting back on sweets.       Relevant Orders   Comp Met (CMET)   HgB A1c     Other   Intractable low back pain (Chronic)   Chronic concern.   Will start Celebrex  while patient is waiting to get surgery.  He is working with PT.  Understands that NSAIDS and Celebrex  can harm the kidneys.  Plans for this to be a short term solution. Do not use with NSAIDS.      Relevant Medications   celecoxib  (CELEBREX ) 100 MG capsule     Follow up plan: No follow-ups on file.      "

## 2024-11-07 NOTE — Assessment & Plan Note (Addendum)
 New diagnosis in June. Will recheck A1c today.  Will follow up next week and discuss results.  He has cut back on his soda use and cutting back on sweets.

## 2024-11-08 ENCOUNTER — Ambulatory Visit: Payer: Self-pay | Admitting: Nurse Practitioner

## 2024-11-08 LAB — COMPREHENSIVE METABOLIC PANEL WITH GFR
ALT: 28 IU/L (ref 0–44)
AST: 25 IU/L (ref 0–40)
Albumin: 4.8 g/dL (ref 3.8–4.9)
Alkaline Phosphatase: 63 IU/L (ref 47–123)
BUN/Creatinine Ratio: 12 (ref 9–20)
BUN: 11 mg/dL (ref 6–24)
Bilirubin Total: 0.6 mg/dL (ref 0.0–1.2)
CO2: 27 mmol/L (ref 20–29)
Calcium: 9.9 mg/dL (ref 8.7–10.2)
Chloride: 100 mmol/L (ref 96–106)
Creatinine, Ser: 0.93 mg/dL (ref 0.76–1.27)
Globulin, Total: 1.9 g/dL (ref 1.5–4.5)
Glucose: 85 mg/dL (ref 70–99)
Potassium: 4.3 mmol/L (ref 3.5–5.2)
Sodium: 139 mmol/L (ref 134–144)
Total Protein: 6.7 g/dL (ref 6.0–8.5)
eGFR: 95 mL/min/1.73

## 2024-11-08 LAB — HEMOGLOBIN A1C
Est. average glucose Bld gHb Est-mCnc: 128 mg/dL
Hgb A1c MFr Bld: 6.1 % — ABNORMAL HIGH (ref 4.8–5.6)

## 2024-11-12 ENCOUNTER — Encounter: Payer: Self-pay | Admitting: Nurse Practitioner

## 2024-11-12 ENCOUNTER — Ambulatory Visit: Admitting: Nurse Practitioner

## 2024-11-12 VITALS — BP 133/89 | HR 69 | Temp 98.0°F | Ht 67.01 in | Wt 178.2 lb

## 2024-11-12 DIAGNOSIS — Z23 Encounter for immunization: Secondary | ICD-10-CM

## 2024-11-12 DIAGNOSIS — E119 Type 2 diabetes mellitus without complications: Secondary | ICD-10-CM | POA: Diagnosis not present

## 2024-11-12 DIAGNOSIS — E785 Hyperlipidemia, unspecified: Secondary | ICD-10-CM

## 2024-11-12 DIAGNOSIS — I10 Essential (primary) hypertension: Secondary | ICD-10-CM | POA: Diagnosis not present

## 2024-11-12 DIAGNOSIS — Z7984 Long term (current) use of oral hypoglycemic drugs: Secondary | ICD-10-CM | POA: Diagnosis not present

## 2024-11-12 DIAGNOSIS — G8929 Other chronic pain: Secondary | ICD-10-CM

## 2024-11-12 DIAGNOSIS — M7918 Myalgia, other site: Secondary | ICD-10-CM

## 2024-11-12 MED ORDER — HYDROXYZINE HCL 25 MG PO TABS: 25.0000 mg | ORAL_TABLET | Freq: Three times a day (TID) | ORAL | 1 refills | Status: AC | PRN

## 2024-11-12 MED ORDER — ATENOLOL 50 MG PO TABS: 50.0000 mg | ORAL_TABLET | Freq: Every day | ORAL | 1 refills | Status: AC

## 2024-11-12 MED ORDER — ATORVASTATIN CALCIUM 40 MG PO TABS: 40.0000 mg | ORAL_TABLET | Freq: Every day | ORAL | 1 refills | Status: AC

## 2024-11-12 MED ORDER — BACLOFEN 10 MG PO TABS: ORAL_TABLET | ORAL | 1 refills | Status: AC

## 2024-11-12 MED ORDER — SUCRALFATE 1 G PO TABS: 1.0000 g | ORAL_TABLET | Freq: Every day | ORAL | 3 refills | Status: AC

## 2024-11-12 MED ORDER — OMEPRAZOLE 20 MG PO CPDR: 20.0000 mg | DELAYED_RELEASE_CAPSULE | Freq: Every day | ORAL | 1 refills | Status: AC

## 2024-11-12 MED ORDER — DULOXETINE HCL 30 MG PO CPEP: 30.0000 mg | ORAL_CAPSULE | Freq: Two times a day (BID) | ORAL | 1 refills | Status: AC

## 2024-11-12 MED ORDER — BUSPIRONE HCL 30 MG PO TABS: 30.0000 mg | ORAL_TABLET | Freq: Two times a day (BID) | ORAL | 1 refills | Status: AC

## 2024-11-12 NOTE — Assessment & Plan Note (Signed)
 Chronic, ongoing.  Continue current medication regimen of Atorvastatin . Lipid panel today.  Refills sent today.  Follow up in 6 months.  Call sooner if concerns arise.

## 2024-11-12 NOTE — Progress Notes (Signed)
 "  BP 133/89 (BP Location: Left Arm, Cuff Size: Normal)   Pulse 69   Temp 98 F (36.7 C) (Oral)   Ht 5' 7.01 (1.702 m)   Wt 178 lb 3.2 oz (80.8 kg)   SpO2 97%   BMI 27.90 kg/m    Subjective:    Patient ID: Jose Washington, male    DOB: 09/04/1966, 59 y.o.   MRN: [id number]  HPI: Jose Washington is a 59 y.o. male  Chief Complaint  Patient presents with   office visit    6 month F/u   HYPERTENSION / HYPERLIPIDEMIA Satisfied with current treatment? yes Duration of hypertension: years BP monitoring frequency: not checking BP range:  BP medication side effects: no Past BP meds: atenolol  Duration of hyperlipidemia: years Cholesterol medication side effects: no Cholesterol supplements: none Past cholesterol medications: atorvastain (lipitor)  Medication compliance: excellent compliance Aspirin: no Recent stressors: no Recurrent headaches: no Visual changes: no Palpitations: no Dyspnea: no Chest pain: no Lower extremity edema: no Dizzy/lightheaded: no  DEPRESSION/ANXIETY Patient feels like the Duloxetine  is still working well for his mood.  He feels like he is doing great.  Feels like this is a good regimen for him at this time.  Also doing Buspar  30mg  BID which is also working well for him.  Denies concerns at visit today. Denies SI.   DIABETES Patient Has made diet changes which improved his A1c WAS 7.0 Tto 6.1%.  Hypoglycemic episodes:no Polydipsia/polyuria: no Visual disturbance: no Chest pain: no Paresthesias: no Glucose Monitoring: no  Accucheck frequency: Not Checking  Fasting glucose:  Post prandial:  Evening:  Before meals: Taking Insulin?: no  Long acting insulin:  Short acting insulin: Blood Pressure Monitoring: not checking Retinal Examination: Not up to Date Foot Exam: Up to Date Diabetic Education: Not Completed Pneumovax: Up to Date Influenza: Up to Date Aspirin: no  Relevant past medical, surgical, family and social history reviewed  and updated as indicated. Interim medical history since our last visit reviewed. Allergies and medications reviewed and updated.  Review of Systems  Eyes:  Negative for visual disturbance.  Respiratory:  Negative for chest tightness and shortness of breath.   Cardiovascular:  Negative for chest pain, palpitations and leg swelling.  Endocrine: Negative for polydipsia and polyuria.  Neurological:  Negative for dizziness, light-headedness, numbness and headaches.    Per HPI unless specifically indicated above     Objective:    BP 133/89 (BP Location: Left Arm, Cuff Size: Normal)   Pulse 69   Temp 98 F (36.7 C) (Oral)   Ht 5' 7.01 (1.702 m)   Wt 178 lb 3.2 oz (80.8 kg)   SpO2 97%   BMI 27.90 kg/m   Wt Readings from Last 3 Encounters:  11/12/24 178 lb 3.2 oz (80.8 kg)  11/07/24 177 lb 9.6 oz (80.6 kg)  10/18/24 172 lb (78 kg)    Physical Exam Vitals and nursing note reviewed.  Constitutional:      General: He is not in acute distress.    Appearance: Normal appearance. He is not ill-appearing, toxic-appearing or diaphoretic.  HENT:     Head: Normocephalic.     Right Ear: External ear normal.     Left Ear: External ear normal.     Nose: Nose normal. No congestion or rhinorrhea.     Mouth/Throat:     Mouth: Mucous membranes are moist.  Eyes:     General:        Right eye: No discharge.  Left eye: No discharge.     Extraocular Movements: Extraocular movements intact.     Conjunctiva/sclera: Conjunctivae normal.     Pupils: Pupils are equal, round, and reactive to light.  Cardiovascular:     Rate and Rhythm: Normal rate and regular rhythm.     Heart sounds: No murmur heard. Pulmonary:     Effort: Pulmonary effort is normal. No respiratory distress.     Breath sounds: Normal breath sounds. No wheezing, rhonchi or rales.  Abdominal:     General: Abdomen is flat. Bowel sounds are normal.  Musculoskeletal:     Cervical back: Normal range of motion and neck supple.   Skin:    General: Skin is warm and dry.     Capillary Refill: Capillary refill takes less than 2 seconds.  Neurological:     General: No focal deficit present.     Mental Status: He is alert and oriented to person, place, and time.  Psychiatric:        Mood and Affect: Mood normal.        Behavior: Behavior normal.        Thought Content: Thought content normal.        Judgment: Judgment normal.     Results for orders placed or performed in visit on 11/07/24  Microalbumin, Urine Waived   Collection Time: 11/07/24 10:05 AM  Result Value Ref Range   Microalb, Ur Waived 80 (H) 0 - 19 mg/L   Creatinine, Urine Waived 300 10 - 300 mg/dL   Microalb/Creat Ratio 30-300 (H) <30 mg/g  Comp Met (CMET)   Collection Time: 11/07/24 10:06 AM  Result Value Ref Range   Glucose 85 70 - 99 mg/dL   BUN 11 6 - 24 mg/dL   Creatinine, Ser 9.06 0.76 - 1.27 mg/dL   eGFR 95 >40 fO/fpw/8.26   BUN/Creatinine Ratio 12 9 - 20   Sodium 139 134 - 144 mmol/L   Potassium 4.3 3.5 - 5.2 mmol/L   Chloride 100 96 - 106 mmol/L   CO2 27 20 - 29 mmol/L   Calcium  9.9 8.7 - 10.2 mg/dL   Total Protein 6.7 6.0 - 8.5 g/dL   Albumin 4.8 3.8 - 4.9 g/dL   Globulin, Total 1.9 1.5 - 4.5 g/dL   Bilirubin Total 0.6 0.0 - 1.2 mg/dL   Alkaline Phosphatase 63 47 - 123 IU/L   AST 25 0 - 40 IU/L   ALT 28 0 - 44 IU/L  HgB A1c   Collection Time: 11/07/24 10:06 AM  Result Value Ref Range   Hgb A1c MFr Bld 6.1 (H) 4.8 - 5.6 %   Est. average glucose Bld gHb Est-mCnc 128 mg/dL      Assessment & Plan:   Problem List Items Addressed This Visit       Cardiovascular and Mediastinum   Benign essential hypertension   Chronic.  Controlled.  Continue with current medication regimen of Atenolol  50mg  daily.  Refills sent today.  Labs ordered today.  Return to clinic in 6 months for reevaluation.  Call sooner if concerns arise.       Relevant Medications   atenolol  (TENORMIN ) 50 MG tablet   atorvastatin  (LIPITOR) 40 MG tablet      Endocrine   Diabetes mellitus treated with oral medication (HCC) - Primary   Chronic.  Controlled.  A1c last week was 6.1%.  Continue with current medication regimen.  Discussed eye exam.  Labs ordered today.  Return to clinic in 6 months  for reevaluation.  Call sooner if concerns arise.        Relevant Medications   atorvastatin  (LIPITOR) 40 MG tablet     Other   Chronic musculoskeletal pain (Chronic)   Chronic.  Doing okay with Celebrex .  Trying to take 1 every couple of days.        Relevant Medications   baclofen  (LIORESAL ) 10 MG tablet   DULoxetine  (CYMBALTA ) 30 MG capsule   Hyperlipidemia   Chronic, ongoing.  Continue current medication regimen of Atorvastatin . Lipid panel today.  Refills sent today.  Follow up in 6 months.  Call sooner if concerns arise.       Relevant Medications   atenolol  (TENORMIN ) 50 MG tablet   atorvastatin  (LIPITOR) 40 MG tablet   Other Visit Diagnoses       Need for COVID-19 vaccine       Relevant Orders   [employer] Comirnaty Covid-19 Vaccine 59yrs & older (Completed)        Follow up plan: No follow-ups on file.      "

## 2024-11-12 NOTE — Assessment & Plan Note (Signed)
 Chronic.  Controlled.  Continue with current medication regimen of Atenolol  50mg  daily.  Refills sent today.  Labs ordered today.  Return to clinic in 6 months for reevaluation.  Call sooner if concerns arise.

## 2024-11-12 NOTE — Assessment & Plan Note (Signed)
 Chronic.  Controlled.  A1c last week was 6.1%.  Continue with current medication regimen.  Discussed eye exam.  Labs ordered today.  Return to clinic in 6 months for reevaluation.  Call sooner if concerns arise.

## 2024-11-12 NOTE — Assessment & Plan Note (Signed)
 Chronic.  Doing okay with Celebrex .  Trying to take 1 every couple of days.

## 2024-11-21 ENCOUNTER — Ambulatory Visit: Admitting: Pain Medicine

## 2024-11-28 ENCOUNTER — Ambulatory Visit: Attending: Pain Medicine | Admitting: Pain Medicine

## 2024-11-28 VITALS — BP 140/93 | HR 60 | Temp 98.1°F | Resp 16 | Ht 67.0 in | Wt 175.0 lb

## 2024-11-28 DIAGNOSIS — Z09 Encounter for follow-up examination after completed treatment for conditions other than malignant neoplasm: Secondary | ICD-10-CM | POA: Insufficient documentation

## 2024-11-28 DIAGNOSIS — M4310 Spondylolisthesis, site unspecified: Secondary | ICD-10-CM | POA: Diagnosis present

## 2024-11-28 DIAGNOSIS — M51371 Other intervertebral disc degeneration, lumbosacral region with lower extremity pain only: Secondary | ICD-10-CM | POA: Insufficient documentation

## 2024-11-28 DIAGNOSIS — G8929 Other chronic pain: Secondary | ICD-10-CM | POA: Insufficient documentation

## 2024-11-28 DIAGNOSIS — M5416 Radiculopathy, lumbar region: Secondary | ICD-10-CM | POA: Diagnosis present

## 2024-11-28 DIAGNOSIS — M48061 Spinal stenosis, lumbar region without neurogenic claudication: Secondary | ICD-10-CM | POA: Diagnosis present

## 2024-11-28 DIAGNOSIS — M5459 Other low back pain: Secondary | ICD-10-CM | POA: Diagnosis present

## 2024-11-28 DIAGNOSIS — M5442 Lumbago with sciatica, left side: Secondary | ICD-10-CM | POA: Insufficient documentation

## 2024-11-28 DIAGNOSIS — M79605 Pain in left leg: Secondary | ICD-10-CM | POA: Insufficient documentation

## 2024-11-28 DIAGNOSIS — M5410 Radiculopathy, site unspecified: Secondary | ICD-10-CM | POA: Diagnosis present

## 2024-11-28 DIAGNOSIS — R937 Abnormal findings on diagnostic imaging of other parts of musculoskeletal system: Secondary | ICD-10-CM | POA: Insufficient documentation

## 2024-11-28 NOTE — Progress Notes (Signed)
 PROVIDER NOTE: Interpretation of information contained herein should be left to medically-trained personnel. Specific patient instructions are provided elsewhere under Patient Instructions section of medical record. This document was created in part using AI and STT-dictation technology, any transcriptional errors that may result from this process are unintentional.  Patient: Jose Washington  Service: E/M   PCP: Melvin Pao, NP  DOB: [birth year]  DOS: 11/28/2024  Provider: Eric DELENA Como, MD  MRN: [id number]  Delivery: Face-to-face  Specialty: Interventional Pain Management  Type: Established Patient  Setting: Ambulatory outpatient facility  Specialty designation: 09  Referring Prov.: Melvin Pao, NP  Location: Outpatient office facility       History of present illness (HPI) Jose Washington, a 59 y.o. year old male, is here today because of his Chronic left-sided low back pain with left-sided sciatica [M54.42, G89.29]. Jose Washington primary complain today is Back Pain (Lower left ) and Leg Pain (Left )  Pertinent problems: Mr. Scripter has Chronic neck pain (3ry area of Pain); Chronic pain syndrome; Chronic low back pain (1ry area of Pain) (Bilateral) (L>R) w/o sciatica ; Chronic upper back pain (4th area of Pain) (Bilateral); Chronic shoulder pain (Bilateral) (L>R); Osteoarthritis of AC (acromioclavicular) joint (Right); Chronic sacroiliac joint pain (Bilateral) (L>R); Muscle spasticity; Cervical DDD (C4-5, C5-6, C6-7 and C7-T1); Cervical foraminal stenosis (Bilateral: C5-6 & C6-7, Left: C4-5 & C7-T1); Cervical radiculitis (Bilateral) (R>L); Cervical facet syndrome (Bilateral) (L>R); Cervical spondylosis; Musculoskeletal neck pain (trapezius); Chronic knee pain (Left); Chronic lower extremity pain (2ry area of Pain) (Left); Grade 1 Retrolisthesis of L3 over L4; Lumbar facet arthropathy (Bilateral); Lumbar facet osteoarthritis; Lumbar facet syndrome (Bilateral) (R>L); Lumbar  spondylosis; Chronic shoulder pain (Right); Suprascapular neuropathy (Right); DDD (degenerative disc disease), lumbosacral; Lumbar facet hypertrophy (Multilevel) (Bilateral); Spondylosis without myelopathy or radiculopathy, lumbosacral region; Inflammatory spondylopathy of lumbosacral region; Chronic lower extremity pain (Right); Chronic musculoskeletal pain; Abnormal MRI, lumbar spine (09/07/2017); Cervicalgia (Bilateral); Spondylosis without myelopathy or radiculopathy, cervical region; Myofascial pain syndrome of thoracic spine (serratus muscle) (Right); Chronic flank pain (Right); Abnormal result on screening urine test (03/27/20 & 08/20/20); Chronic hip pain (Bilateral); Chronic hip pain (Right); Enthesopathy of hip region (Right); Osteoarthritis of hip (Right); Trigger point of shoulder region (Right); Cervical facet joint pain; Trigger point with neck pain; Trigger point with back pain; Lumbar facet joint pain; Sciatica associated with disorder of lumbar spine; Lumbar radiculopathy (Left); Lumbosacral radiculopathy at S1 (Left); Chronic low back pain (Left) w/ sciatica (Left); Trigger point of thoracic region (Right); Low back pain of over 3 months duration; Low back pain (Intermittent); Intractable low back pain; Multifactorial low back pain; Mechanical low back pain; Chronic hip pain (Left); Lumbar foraminal stenosis (Right: L4-5) (Left: L5-S1  SEVERE); Lumbar radiculitis (Left); and Chronic radicular pain of lower extremity (L5) (Left) on their pertinent problem list.  Pain Assessment: Severity of Chronic pain is reported as a 7 /10. Location: Back Lower, Left/Radiates to posteriro leg down to outer lower leg to ankle of left leg. Onset: More than a month ago. Quality: Radiating, Numbness, Shooting, Sharp, Aching, Constant, Tingling, Pins and needles (Leg weakness). Timing: Constant. Modifying factor(s): Rest, Tylenol  arthritis, streching, and PT. Vitals:  height is 5' 7 (1.702 m) and weight is 175 lb  (79.4 kg). His temporal temperature is 98.1 F (36.7 C). His blood pressure is 140/93 (abnormal) and his pulse is 60. His respiration is 16 and oxygen saturation is 100%.  BMI: Estimated body mass index is 27.41 kg/m as calculated from the  following:   Height as of this encounter: 5' 7 (1.702 m).   Weight as of this encounter: 175 lb (79.4 kg).  Last encounter: 10/10/2024. Last procedure: 10/18/2024.  Reason for encounter: post-procedure evaluation and assessment.   Discussed the use of AI scribe software for clinical note transcription with the patient, who gave verbal consent to proceed.  History of Present Illness   Jose Washington is a 59 year old male who presents for follow-up after a lumbar epidural steroid injection.  He returns after a left L5-S1 lumbar epidural steroid injection on October 18, 2024, with complete pain relief for about five days, followed by gradual return of pain.  His pain is on the left lower back, radiating from the PSIS through the buttock to the lateral upper leg and down to the top of the left foot and ankle. At worst it is 9/10. It does not involve the tailbone.  He has numbness in the distal left leg that spares the great toe and occurs even when he is off work.  This was his second lumbar epidural steroid injection.      Post-Procedure Evaluation   Type: Lumbar epidural steroid injection (LESI) (interlaminar) #2    Laterality: Left   Level:  L5-S1 Level.  Imaging: Fluoroscopic guidance Spinal ([id number]) Anesthesia: Local anesthesia (1-2% Lidocaine ) Anxiolysis: None Declined                    Sedation: No Sedation                       DOS: 10/18/2024  Performed by: Eric DELENA Como, MD  Purpose: Diagnostic/Therapeutic Indications: Lumbar radicular pain of intraspinal etiology of more than 4 weeks that has failed to respond to conservative therapy and is severe enough to impact quality of life or function. 1. Chronic low back pain  (Left) w/ sciatica (Left)   2. Chronic lower extremity pain (2ry area of Pain) (Left)   3. Lumbar radiculitis (Left)   4. Lumbar radiculopathy (Left)   5. Lumbosacral radiculopathy at S1 (Left)   6. Sciatica associated with disorder of lumbar spine   7. Grade 1 Retrolisthesis of L3 over L4   8. Intermittent low back pain    NAS-11 Pain score:   Pre-procedure: 9 /10   Post-procedure: 1 /10     Effectiveness:  Initial hour after procedure: 100 %. Subsequent 4-6 hours post-procedure: 100 %. Analgesia past initial 6 hours: 0 % (On day 5 pain returned and gradually back to baseline). Ongoing improvement:  Analgesic: The patient indicates having attained 100% relief of the pain for the duration of local anesthetic followed by a gradual return of his pain after approximately 5 days of improvement. Function: Transient improvement ROM: Transient improvement Interpretation: Based on the above results, clearly the etiology of his pain resides in the lumbar region however there appears to be recurrent inflammation and return of pain.  The patient has requested surgical evaluation.  Pharmacotherapy Assessment   Opioid Analgesic: No chronic opioid analgesics therapy prescribed by our practice. Hydrocodone /APAP 5/325, 1 tab PO QD (5 mg/day of hydrocodone ) MME/day: 5 mg/day.   Monitoring: [place] PMP: PDMP reviewed during this encounter.       Pharmacotherapy: No side-effects or adverse reactions reported. Compliance: No problems identified. Effectiveness: Clinically acceptable.  No notes on file  UDS:  Summary  Date Value Ref Range Status  11/28/2023 FINAL  Final    Comment:    ====================================================================  Tox[employer] Select 13 (MW) ==================================================================== Specimen Alert Not Detected result may be consistent with the time of last use noted for this medication. AS NEEDED  (Hydrocodone ) ==================================================================== Test                             Result       Flag       Units  Drug Absent but Declared for Prescription Verification   Hydrocodone                     Not Detected UNEXPECTED ng/mg creat ==================================================================== Test                      Result    Flag   Units      Ref Range   Creatinine              288              mg/dL      >=79 ==================================================================== Declared Medications:  The flagging and interpretation on this report are based on the  following declared medications.  Unexpected results may arise from  inaccuracies in the declared medications.   **Note: The testing scope of this panel includes these medications:   Hydrocodone  (Norco)   **Note: The testing scope of this panel does not include the  following reported medications:   Acetaminophen   Acetaminophen  (Norco)  Atenolol  (Tenormin )  Atorvastatin  (Lipitor)  Baclofen  (Lioresal )  Buspirone  (Buspar )  Calcium   Desloratadine (Clarinex)  Diphenhydramine  Docusate (Colace)  Duloxetine  (Cymbalta )  Fluticasone (Flonase)  Hydroxyzine   Ibuprofen (Advil)  Levalbuterol  (Xopenex )  Magnesium (Mag-Ox)  Melatonin  Methylsulfonylmethane  Multivitamin  Omeprazole  (Prilosec)  Ondansetron  (Zofran )  Potassium  Simethicone  (Mylicon)  Sucralfate  (Carafate )  Supplement (Prevagen)  Vitamin B12  Vitamin D3  Vitamin E ==================================================================== For clinical consultation, please call [phone]. ====================================================================     No results found for: CBDTHCR No results found for: D8THCCBX No results found for: D9THCCBX  ROS  Constitutional: Denies any fever or chills Gastrointestinal: No reported hemesis, hematochezia, vomiting, or acute GI  distress Musculoskeletal: Denies any acute onset joint swelling, redness, loss of ROM, or weakness Neurological: No reported episodes of acute onset apraxia, aphasia, dysarthria, agnosia, amnesia, paralysis, loss of coordination, or loss of consciousness  Medication Review  Apoaequorin, Calcium  Carbonate, DULoxetine , Elderberry, Fluticasone Propionate (Inhal), Glucosamine HCl, Methylsulfonylmethane, Potassium Chloride ER, Vitamin D3, acetaminophen , atenolol , atorvastatin , baclofen , busPIRone , celecoxib , cyanocobalamin , desloratadine-pseudoephedrine, diphenhydrAMINE HCl, docusate sodium, fluticasone, hydrOXYzine , ibuprofen, levalbuterol , magnesium oxide, melatonin, multivitamin, omeprazole , ondansetron , simethicone , sucralfate , and vitamin E  History Review  Allergy: Mr. Biehn has no known allergies. Drug: Mr. Bordon  reports no history of drug use. Alcohol:  reports no history of alcohol use. Tobacco:  reports that he has never smoked. He has never used smokeless tobacco. Social: Mr. Beer  reports that he has never smoked. He has never used smokeless tobacco. He reports that he does not drink alcohol and does not use drugs. Medical:  has a past medical history of Allergy, Anxiety, Chronic duodenal ulcer with hemorrhage (2012), Chronic neck pain, Depression, GERD (gastroesophageal reflux disease), Hyperlipidemia, Hypertension, and Microscopic hematuria. Surgical: Mr. Graybeal  has a past surgical history that includes eye muscle repair (1972 and 1975); bLEEDING ULCER (2012); Flexible sigmoidoscopy; Colonoscopy with propofol  (N/A, 03/24/2021); and Esophagogastroduodenoscopy (egd) with propofol  (N/A, 03/24/2021). Family: family history includes Allergies in his brother; Cancer in his maternal grandfather and  mother; Dementia in his father and paternal grandfather; Depression in his father; Diabetes in his mother; Mental illness in his father; Stroke in his paternal grandfather.  Laboratory Chemistry  Profile   Renal Lab Results  Component Value Date   BUN 11 11/07/2024   CREATININE 0.93 11/07/2024   BCR 12 11/07/2024   GFRAA 84 12/24/2020   GFRNONAA >60 02/08/2021    Hepatic Lab Results  Component Value Date   AST 25 11/07/2024   ALT 28 11/07/2024   ALBUMIN 4.8 11/07/2024   ALKPHOS 63 11/07/2024   LIPASE 28 02/08/2021    Electrolytes Lab Results  Component Value Date   NA 139 11/07/2024   K 4.3 11/07/2024   CL 100 11/07/2024   CALCIUM  9.9 11/07/2024   MG 2.0 11/12/2016    Bone Lab Results  Component Value Date   VD25OH 72.4 09/20/2023   25OHVITD1 23 (L) 11/12/2016   25OHVITD2 <1.0 11/12/2016   25OHVITD3 23 11/12/2016    Inflammation (CRP: Acute Phase) (ESR: Chronic Phase) Lab Results  Component Value Date   CRP 1.4 (H) 11/12/2016   ESRSEDRATE 3 11/12/2016         Note: Above Lab results reviewed.  Recent Imaging Review  DG PAIN CLINIC C-ARM 1-60 MIN NO REPORT Fluoro was used, but no Radiologist interpretation will be provided.  Please refer to NOTES tab for provider progress note. Note: Reviewed        Physical Exam  Vitals: BP (!) 140/93 (Patient Position: Sitting, Cuff Size: Normal)   Pulse 60   Temp 98.1 F (36.7 C) (Temporal)   Resp 16   Ht 5' 7 (1.702 m)   Wt 175 lb (79.4 kg)   SpO2 100%   BMI 27.41 kg/m  BMI: Estimated body mass index is 27.41 kg/m as calculated from the following:   Height as of this encounter: 5' 7 (1.702 m).   Weight as of this encounter: 175 lb (79.4 kg). Ideal: Ideal body weight: 66.1 kg (145 lb 11.6 oz) Adjusted ideal body weight: 71.4 kg (157 lb 6.9 oz) General appearance: Well nourished, well developed, and well hydrated. In no apparent acute distress Mental status: Alert, oriented x 3 (person, place, & time)       Respiratory: No evidence of acute respiratory distress Eyes: PERLA   Assessment   Diagnosis Status  1. Chronic low back pain (Left) w/ sciatica (Left)   2. Lumbar foraminal stenosis (Right:  L4-5) (Left: L5-S1  SEVERE)   3. Lumbar radiculitis (Left)   4. Chronic radicular pain of lower extremity (L5) (Left)   5. Chronic lower extremity pain (2ry area of Pain) (Left)   6. Grade 1 Retrolisthesis of L3 over L4   7. Intractable low back pain   8. Degeneration of intervertebral disc of lumbosacral region with lower extremity pain   9. Abnormal MRI, lumbar spine (09/07/2017)   10. Postop check    Persistent Worsening Persistent   Updated Problems: Problem  Chronic radicular pain of lower extremity (L5) (Left)  Low back pain (Intermittent)    Plan of Care  Problem-specific:  Assessment and Plan    Chronic left-sided lumbar radiculopathy with sciatica   Chronic left-sided lumbar radiculopathy with sciatica affects the lower back, buttocks, and extends to the top of the left foot, with pain severity between 7 and 9 out of 10. A previous lumbar epidural steroid injection provided temporary relief for five days. He experiences persistent numbness in the distal left leg and seeks surgical  evaluation for alternatives. Referred to neurosurgery for surgical evaluation and sent MRI from 2025 for review. Provided information on spinal cord stimulators as a potential non-surgical option. Ensured neurosurgery referral is within the [facility] network for seamless access to medical records.      Left L5 lumbar radiculitis/radiculopathy Review of the patient's last MRI shows the patient to have a left L5-S1 severe lumbar foraminal stenosis likely responsible for the patient's persistent pain.  The patient is being referred to neurosurgery for alternative surgical evaluation, including a possible left L5 foraminotomy.  In the event that the patient does not fulfill surgical criteria, today we have provided him with information regarding spinal cord stimulators as a possible secondary alternative.  Mr. ZIARE CRYDER has a current medication list which includes the following long-term  medication(s): atenolol , atorvastatin , baclofen , calcium  carbonate, diphenhydramine hcl, duloxetine , fluticasone propionate (inhal), levalbuterol , omeprazole , potassium chloride er, simethicone , and sucralfate .  Pharmacotherapy (Medications Ordered): No orders of the defined types were placed in this encounter.  Orders:  Orders Placed This Encounter  Procedures   Ambulatory referral to Neurosurgery    Referral Priority:   Routine    Referral Type:   Surgical    Referral Reason:   Specialty Services Required    Requested Specialty:   Neurosurgery    Number of Visits Requested:   1   Nursing Instructions:    Please complete this patient's postprocedure evaluation.    Scheduling Instructions:     Please complete this patient's postprocedure evaluation.     Interventional Therapies  Risk Factors  Considerations:   Abnormal UDS (03/27/2020) & (08/20/20) (+) undisclosed carboxy-THC    Planned  Pending:   Referral to neurosurgery (11/28/2024)    Under consideration:   Therapeutic left L5-S1 transforaminal ESI #1  Diagnostic left lumbar spinal cord stimulator trial #1    Completed:   Therapeutic Right trapezius, right longissimus Thoracics muscle, & right erector spinae muscle trigger point injections (03/19/2024)  Diagnostic/therapeutic right IA hip inj. x1 (12/09/2020) (100/100/95/90-95)  Therapeutic right lumbar facet RFA x3 (07/17/2024) (100/100/100/100) (low back pain is gone)  Palliative right lumbar facet MBB x2 (06/07/2019) (100/100/100 x20 days/>75 x3 months)  Diagnostic/therapeutic left lumbar facet MBB x1 (06/07/2019) (100/100/100 x20 days/>75 x3 months)  Diagnostic/therapeutic left L4-5 LESI x2 (07/25/2018) (n/a)  Diagnostic/therapeutic left L5-S1 LESI x2 (12/27/2023, 10/18/2024) (100/100/100 x 5 days/0)  Palliative left CESI x3 (02/16/2018) (100/100/100/100)  Diagnostic right cervical facet MBB x2 (08/19/2022) (100/100/100/100) (w/ local anesthetic only)  Diagnostic left  cervical facet MBB x1 (01/10/2020) (100/100/100)  Therapeutic right cervical facet RFA x1 (04/12/2023) (100/100/90/90)  Diagnostic/therapeutic right shoulder region TPI/MNB x2 (07/19/2022) (100/100/100/100)  Referral to physical therapy x3 (last one on 08/29/2024)  Referral to neurosurgery (11/28/2024)    Therapeutic  Palliative (PRN) options:   Therapeutic lumbar facet RFA     Pharmacotherapy  Nonopioids transferred 08/20/2020: Baclofen       Return if symptoms worsen or fail to improve.    Recent Visits Date Type Provider Dept  10/18/24 Procedure visit Tanya Glisson, MD [facility]  10/10/24 Office Visit Tanya Glisson, MD [facility]  Showing recent visits within past 90 days and meeting all other requirements Today's Visits Date Type Provider Dept  11/28/24 Office Visit Tanya Glisson, MD [facility]  Showing today's visits and meeting all other requirements Future Appointments No visits were found meeting these conditions. Showing future appointments within next 90 days and meeting all other requirements  I discussed the assessment and treatment  plan with the patient. The patient was provided an opportunity to ask questions and all were answered. The patient agreed with the plan and demonstrated an understanding of the instructions.  Patient advised to call back or seek an in-person evaluation if the symptoms or condition worsens.  Duration of encounter: 30 minutes.  Total time on encounter, as per AMA guidelines included both the face-to-face and non-face-to-face time personally spent by the physician and/or other qualified health care professional(s) on the day of the encounter (includes time in activities that require the physician or other qualified health care professional and does not include time in activities normally performed by clinical staff). Physician's time may include the following activities when performed: Preparing to  see the patient (e.g., pre-charting review of records, searching for previously ordered imaging, lab work, and nerve conduction tests) Review of prior analgesic pharmacotherapies. Reviewing PMP Interpreting ordered tests (e.g., lab work, imaging, nerve conduction tests) Performing post-procedure evaluations, including interpretation of diagnostic procedures Obtaining and/or reviewing separately obtained history Performing a medically appropriate examination and/or evaluation Counseling and educating the patient/family/caregiver Ordering medications, tests, or procedures Referring and communicating with other health care professionals (when not separately reported) Documenting clinical information in the electronic or other health record Independently interpreting results (not separately reported) and communicating results to the patient/ family/caregiver Care coordination (not separately reported)  Note by: Eric DELENA Como, MD (TTS and AI technology used. I apologize for any typographical errors that were not detected and corrected.) Date: 11/28/2024; Time: 9:43 AM

## 2024-12-04 ENCOUNTER — Encounter: Payer: Self-pay | Admitting: Neurosurgery

## 2024-12-05 ENCOUNTER — Encounter: Payer: Self-pay | Admitting: Pain Medicine

## 2024-12-05 ENCOUNTER — Ambulatory Visit
Admission: RE | Admit: 2024-12-05 | Discharge: 2024-12-05 | Disposition: A | Source: Ambulatory Visit | Attending: Pain Medicine | Admitting: Pain Medicine

## 2024-12-05 ENCOUNTER — Ambulatory Visit: Admitting: Pain Medicine

## 2024-12-05 VITALS — BP 114/80 | HR 62 | Temp 97.1°F | Ht 67.0 in | Wt 175.0 lb

## 2024-12-05 DIAGNOSIS — G8929 Other chronic pain: Secondary | ICD-10-CM

## 2024-12-05 DIAGNOSIS — M4316 Spondylolisthesis, lumbar region: Secondary | ICD-10-CM

## 2024-12-05 DIAGNOSIS — M5442 Lumbago with sciatica, left side: Secondary | ICD-10-CM

## 2024-12-05 DIAGNOSIS — M4317 Spondylolisthesis, lumbosacral region: Secondary | ICD-10-CM

## 2024-12-05 DIAGNOSIS — R937 Abnormal findings on diagnostic imaging of other parts of musculoskeletal system: Secondary | ICD-10-CM | POA: Diagnosis not present

## 2024-12-05 DIAGNOSIS — M79605 Pain in left leg: Secondary | ICD-10-CM

## 2024-12-05 NOTE — Progress Notes (Signed)
 Safety precautions to be maintained throughout the outpatient stay will include: orient to surroundings, keep bed in low position, maintain call bell within reach at all times, provide assistance with transfer out of bed and ambulation.

## 2024-12-05 NOTE — Progress Notes (Signed)
 PROVIDER NOTE: Interpretation of information contained herein should be left to medically-trained personnel. Specific patient instructions are provided elsewhere under Patient Instructions section of medical record. This document was created in part using AI and STT-dictation technology, any transcriptional errors that may result from this process are unintentional.  Patient: Jose Washington  Service: E/M   PCP: Melvin Pao, NP  DOB: 09-22-1966  DOS: 12/05/2024  Provider: Eric DELENA Como, MD  MRN: [id number]  Delivery: Face-to-face  Specialty: Interventional Pain Management  Type: Established Patient  Setting: Ambulatory outpatient facility  Specialty designation: 09  Referring Prov.: Melvin Pao, NP  Location: Outpatient office facility       History of present illness (HPI) [name], a 59 y.o. year old male, is here today because of his Chronic left-sided low back pain with left-sided sciatica [M54.42, G89.29]. Jose Washington primary complain today is Back Pain (Left side)  Pertinent problems: Jose Washington has Chronic neck pain (3ry area of Pain); Chronic pain syndrome; Chronic low back pain (1ry area of Pain) (Bilateral) (L>R) w/o sciatica ; Chronic upper back pain (4th area of Pain) (Bilateral); Chronic shoulder pain (Bilateral) (L>R); Osteoarthritis of AC (acromioclavicular) joint (Right); Chronic sacroiliac joint pain (Bilateral) (L>R); Muscle spasticity; Cervical DDD (C4-5, C5-6, C6-7 and C7-T1); Cervical foraminal stenosis (Bilateral: C5-6 & C6-7, Left: C4-5 & C7-T1); Cervical radiculitis (Bilateral) (R>L); Cervical facet syndrome (Bilateral) (L>R); Cervical spondylosis; Musculoskeletal neck pain (trapezius); Chronic knee pain (Left); Chronic lower extremity pain (2ry area of Pain) (Left); Grade 1 Retrolisthesis of L3 over L4; Lumbar facet arthropathy (Bilateral); Lumbar facet osteoarthritis; Lumbar facet syndrome (Bilateral) (R>L); Lumbar spondylosis; Chronic shoulder  pain (Right); Suprascapular neuropathy (Right); DDD (degenerative disc disease), lumbosacral; Lumbar facet hypertrophy (Multilevel) (Bilateral); Spondylosis without myelopathy or radiculopathy, lumbosacral region; Inflammatory spondylopathy of lumbosacral region; Chronic lower extremity pain (Right); Chronic musculoskeletal pain; Abnormal MRI, lumbar spine (09/07/2017); Cervicalgia (Bilateral); Spondylosis without myelopathy or radiculopathy, cervical region; Myofascial pain syndrome of thoracic spine (serratus muscle) (Right); Chronic flank pain (Right); Abnormal result on screening urine test (03/27/20 & 08/20/20); Chronic hip pain (Bilateral); Chronic hip pain (Right); Enthesopathy of hip region (Right); Osteoarthritis of hip (Right); Trigger point of shoulder region (Right); Cervical facet joint pain; Trigger point with neck pain; Trigger point with back pain; Lumbar facet joint pain; Sciatica associated with disorder of lumbar spine; Lumbar radiculopathy (Left); Lumbosacral radiculopathy at S1 (Left); Chronic low back pain (Left) w/ sciatica (Left); Trigger point of thoracic region (Right); Low back pain of over 3 months duration; Low back pain (Intermittent); Intractable low back pain; Multifactorial low back pain; Mechanical low back pain; Chronic hip pain (Left); Lumbar foraminal stenosis (Right: L4-5) (Left: L5-S1  SEVERE); Lumbar radiculitis (Left); Chronic radicular pain of lower extremity (L5) (Left); Grade 1 Anterolisthesis of lumbar spine (L4/L5); and Grade 1 Anterolisthesis of lumbosacral spine (L5/S1) on their pertinent problem list.  Pain Assessment: Severity of Chronic pain is reported as a 9 /10. Location: Back Lower, Left/pain radiaties down his left leg down to his foot.. Onset: More than a month ago. Quality: Aching, Numbness, Pins and needles. Timing: Constant. Modifying factor(s): Rest, tylenol  arthritis, streching, PT. Vitals:  height is 5' 7 (1.702 m) and weight is 175 lb (79.4 kg). His  temperature is 97.1 F (36.2 C) (abnormal). His blood pressure is 114/80 and his pulse is 62. His oxygen saturation is 99%.  BMI: Estimated body mass index is 27.41 kg/m as calculated from the following:   Height as of this encounter: 5'  7 (1.702 m).   Weight as of this encounter: 175 lb (79.4 kg).  Last encounter: 11/28/2024. Last procedure: 10/18/2024.  Reason for encounter: patient-requested evaluation.  On the patient's last visit he had indicated being interested in pursuing a neurosurgical evaluation for his lower back problems.  At that time he was returning for a follow-up evaluation after having had his second and a possible series of 3 left-sided L5-S1 lumbar epidural steroid injections.  He indicated on the day of the procedure that his preprocedure pain score was a 9/10.  Following the procedure without any IV sedatives he indicated having attained significant relief with his postprocedure pain being a 1/10.  At the time of his follow-up evaluation on 11/28/2024 he indicated that by the time he had arrived home he was experiencing 100% relief of the pain which lasted for the duration of the local anesthetic.  This relief seem to have continued for an additional 5 days after which the pain gradually returned to baseline.  The patient has an MRI of the lumbar spine done on 12/21/2023 which demonstrated progressive multilevel lumbar spondylosis with new moderate to severe left neuroforaminal stenosis at L5-S1 and a worsened moderate right neuroforaminal stenosis at L4-5.  On 08/30/2024 an x-ray of the left hip was ordered secondary to pain in that region.  The x-rays revealed mild osteoarthritis of the left hip without any significant changes compared to prior exams.  Although he has evidence of foraminal stenosis at the above levels, it would seem that symptoms predominate secondary to the foraminal stenosis at L5-S1 on the left side.  In reviewing the patient's last MRI from 12/21/2023, it  indicates that the patient has a new anterolisthesis at L4/L5 and L5/S1.  Because of the patient's intermittent and recurrent nature of his low back pain, today I will be ordering x-rays of the lumbar spine on flexion and extension to assess whether or not were dealing with any lumbar instability.  Discussed the use of AI scribe software for clinical note transcription with the patient, who gave verbal consent to proceed.  History of Present Illness   Jose Washington is a 59 year old male who presents with lower back pain and potential surgical intervention.  He has persistent severe left-sided low back pain with radiation down the left leg and numbness from the knee to the dorsum of the foot, sparing the toes. His last lumbar MRI was on December 21, 2023, and neurosurgery has requested updated imaging before proceeding with surgery.       Pharmacotherapy Assessment   Opioid Analgesic: No chronic opioid analgesics therapy prescribed by our practice.  MME/day: 0 mg/day.   Monitoring: [place] PMP: PDMP reviewed during this encounter.       Pharmacotherapy: No side-effects or adverse reactions reported. Compliance: No problems identified. Effectiveness: Clinically acceptable.  Jose Dorothe LABOR, Jose Washington  12/05/2024  8:43 AM  Sign when Signing Visit Safety precautions to be maintained throughout the outpatient stay will include: orient to surroundings, keep bed in low position, maintain call bell within reach at all times, provide assistance with transfer out of bed and ambulation.     UDS:  Summary  Date Value Ref Range Status  11/28/2023 FINAL  Final    Comment:    ==================================================================== Tox[employer] Select 13 (MW) ==================================================================== Specimen Alert Not Detected result may be consistent with the time of last use noted for this medication. AS NEEDED  (Hydrocodone ) ==================================================================== Test  Result       Flag       Units  Drug Absent but Declared for Prescription Verification   Hydrocodone                     Not Detected UNEXPECTED ng/mg creat ==================================================================== Test                      Result    Flag   Units      Ref Range   Creatinine              288              mg/dL      >=79 ==================================================================== Declared Medications:  The flagging and interpretation on this report are based on the  following declared medications.  Unexpected results may arise from  inaccuracies in the declared medications.   **Note: The testing scope of this panel includes these medications:   Hydrocodone  (Norco)   **Note: The testing scope of this panel does not include the  following reported medications:   Acetaminophen   Acetaminophen  (Norco)  Atenolol  (Tenormin )  Atorvastatin  (Lipitor)  Baclofen  (Lioresal )  Buspirone  (Buspar )  Calcium   Desloratadine (Clarinex)  Diphenhydramine  Docusate (Colace)  Duloxetine  (Cymbalta )  Fluticasone (Flonase)  Hydroxyzine   Ibuprofen (Advil)  Levalbuterol  (Xopenex )  Magnesium (Mag-Ox)  Melatonin  Methylsulfonylmethane  Multivitamin  Omeprazole  (Prilosec)  Ondansetron  (Zofran )  Potassium  Simethicone  (Mylicon)  Sucralfate  (Carafate )  Supplement (Prevagen)  Vitamin B12  Vitamin D3  Vitamin E ==================================================================== For clinical consultation, please call [phone]. ====================================================================     No results found for: CBDTHCR No results found for: D8THCCBX No results found for: D9THCCBX  ROS  Constitutional: Denies any fever or chills Gastrointestinal: No reported hemesis, hematochezia, vomiting, or acute GI  distress Musculoskeletal: Denies any acute onset joint swelling, redness, loss of ROM, or weakness Neurological: No reported episodes of acute onset apraxia, aphasia, dysarthria, agnosia, amnesia, paralysis, loss of coordination, or loss of consciousness  Medication Review  Apoaequorin, Calcium  Carbonate, DULoxetine , Elderberry, Fluticasone Propionate (Inhal), Glucosamine HCl, Methylsulfonylmethane, Potassium Chloride ER, Vitamin D3, acetaminophen , atenolol , atorvastatin , baclofen , busPIRone , celecoxib , cyanocobalamin , desloratadine-pseudoephedrine, diphenhydrAMINE HCl, docusate sodium, fluticasone, hydrOXYzine , ibuprofen, levalbuterol , magnesium oxide, melatonin, multivitamin, omeprazole , ondansetron , simethicone , sucralfate , and vitamin E  History Review  Allergy: Jose Washington has no known allergies. Drug: Jose Washington  reports no history of drug use. Alcohol:  reports no history of alcohol use. Tobacco:  reports that he has never smoked. He has never used smokeless tobacco. Social: Jose Washington  reports that he has never smoked. He has never used smokeless tobacco. He reports that he does not drink alcohol and does not use drugs. Medical:  has a past medical history of Allergy, Anxiety, Chronic duodenal ulcer with hemorrhage (2012), Chronic neck pain, Depression, GERD (gastroesophageal reflux disease), Hyperlipidemia, Hypertension, and Microscopic hematuria. Surgical: Jose Washington  has a past surgical history that includes eye muscle repair (1972 and 1975); bLEEDING ULCER (2012); Flexible sigmoidoscopy; Colonoscopy with propofol  (N/A, 03/24/2021); and Esophagogastroduodenoscopy (egd) with propofol  (N/A, 03/24/2021). Family: family history includes Allergies in his brother; Cancer in his maternal grandfather and mother; Dementia in his father and paternal grandfather; Depression in his father; Diabetes in his mother; Mental illness in his father; Stroke in his paternal grandfather.  Laboratory Chemistry  Profile   Renal Lab Results  Component Value Date   BUN 11 11/07/2024   CREATININE 0.93 11/07/2024   BCR 12 11/07/2024  GFRAA 84 12/24/2020   GFRNONAA >60 02/08/2021    Hepatic Lab Results  Component Value Date   AST 25 11/07/2024   ALT 28 11/07/2024   ALBUMIN 4.8 11/07/2024   ALKPHOS 63 11/07/2024   LIPASE 28 02/08/2021    Electrolytes Lab Results  Component Value Date   NA 139 11/07/2024   K 4.3 11/07/2024   CL 100 11/07/2024   CALCIUM  9.9 11/07/2024   MG 2.0 11/12/2016    Bone Lab Results  Component Value Date   VD25OH 72.4 09/20/2023   25OHVITD1 23 (L) 11/12/2016   25OHVITD2 <1.0 11/12/2016   25OHVITD3 23 11/12/2016    Inflammation (CRP: Acute Phase) (ESR: Chronic Phase) Lab Results  Component Value Date   CRP 1.4 (H) 11/12/2016   ESRSEDRATE 3 11/12/2016         Note: Above Lab results reviewed.  Recent Imaging Review  DG PAIN CLINIC C-ARM 1-60 MIN NO REPORT Fluoro was used, but no Radiologist interpretation will be provided.  Please refer to NOTES tab for provider progress note. Note: Reviewed        Physical Exam  Vitals: BP 114/80   Pulse 62   Temp (!) 97.1 F (36.2 C)   Ht 5' 7 (1.702 m)   Wt 175 lb (79.4 kg)   SpO2 99%   BMI 27.41 kg/m  BMI: Estimated body mass index is 27.41 kg/m as calculated from the following:   Height as of this encounter: 5' 7 (1.702 m).   Weight as of this encounter: 175 lb (79.4 kg). Ideal: Ideal body weight: 66.1 kg (145 lb 11.6 oz) Adjusted ideal body weight: 71.4 kg (157 lb 6.9 oz) General appearance: Well nourished, well developed, and well hydrated. In no apparent acute distress Mental status: Alert, oriented x 3 (person, place, & time)       Respiratory: No evidence of acute respiratory distress Eyes: PERLA   Assessment   Diagnosis Status  1. Chronic low back pain (Left) w/ sciatica (Left)   2. Chronic lower extremity pain (2ry area of Pain) (Left)   3. Abnormal MRI, lumbar spine (09/07/2017)    4. Anterolisthesis of lumbar spine   5. Anterolisthesis of lumbosacral spine    Controlled Controlled Controlled   Updated Problems: Problem  Grade 1 Anterolisthesis of lumbar spine (L4/L5)  Grade 1 Anterolisthesis of lumbosacral spine (L5/S1)  Abnormal MRI, lumbar spine (09/07/2017)   (12/21/2023) LUMBAR SPINE MRI FINDINGS: Segmentation:  Standard.   Alignment:  New mild anterolisthesis at L4-L5 and L5-S1.   Vertebrae: No fracture, evidence of discitis, or bone lesion. Right-greater-than-left pedicle marrow edema at L5, likely stress related. Degenerative perifacet marrow edema on the right from L3-L4 through L5-S1 and on the left at L4-L5 and L5-S1.   Conus medullaris and cauda equina: Conus extends to the L1-L2 level. Conus and cauda equina appear normal.   Paraspinal and other soft tissues: None.   DISC LEVELS:   T12-L1:  Negative.   L1-L2:  Negative.   L2-L3: New mild disc bulging with superimposed small left subarticular disc protrusion. New mild left lateral recess stenosis. No spinal canal or neuroforaminal stenosis.   L3-L4: Progressive disc bulging with superimposed central and left far lateral disc protrusions. As before, disc contacts the exited left L3 nerve root. New mild spinal canal stenosis. Unchanged mild bilateral neuroforaminal stenosis.   L4-L5: Similar mild disc bulging eccentric to the right. Progressive severe right and moderate left facet arthropathy. New mild right lateral recess stenosis. Worsened  moderate right and unchanged mild left neuroforaminal stenosis. No spinal canal stenosis.   L5-S1: New mild disc bulging eccentric to the left. Progressive now severe bilateral facet arthropathy. New mild left lateral recess stenosis. New moderate to severe left neuroforaminal stenosis. No spinal canal or right neuroforaminal stenosis.   IMPRESSION: 1. Progressive multilevel lumbar spondylosis as described above. New moderate to severe left  neuroforaminal stenosis at L5-S1. 2. Worsened moderate right neuroforaminal stenosis at L4-L5.     Electronically Signed   By: Elsie ONEIDA Shoulder M.D.   On: 12/21/2023 16:49  ____________________________________________________________ Lumbar MRI FINDINGS: Vertebrae: Mild reactive marrow edema about the right L4-5 facet due to facet degeneration  Disc levels: L3-4: Mild diffuse disc bulge with disc desiccation and intervertebral disc space narrowing. Disc bulging eccentric to the left, contacting and potentially irritating the exiting left L3 nerve root as it courses of the left neural foramen (series 3, image 13). Mild facet ligamentum flavum hypertrophy. No significant canal or subarticular stenosis. Mild bilateral L3 foraminal narrowing. L4-5: Mild diffuse disc bulge. Moderate right facet arthrosis with associated reactive edema (series 4, image 3). More mild left-sided facet arthrosis. Ligamentum flavum hypertrophy. No significant canal or subarticular stenosis. Mild to moderate bilateral L4 foraminal narrowing. L5-S1: Normal interspace. Mild lateral facet arthrosis. No canal or neural foraminal stenosis.   IMPRESSION: 1. Left eccentric disc bulge at L3-4, contacting and potentially irritating the exiting left L3 nerve root. 2. Disc bulge with facet arthropathy at L4-5 with resultant mild to moderate bilateral L4 foraminal stenosis, slightly worse on the right. 3. Moderate facet arthropathy with associated reactive edema about the right L4-5 facet. Finding could serve as a source for lower back pain.  Electronically Signed   By: Morene Hoard M.D.   On: 09/07/2017 14:33     Plan of Care  Problem-specific:  Assessment and Plan    Chronic left-sided low back pain with left-sided sciatica due to lumbar and lumbosacral spondylolisthesis   He experiences chronic left-sided low back pain with sciatica affecting the left leg from the knee to the top of the foot, consistent with L5  nerve root involvement due to neuroforaminal stenosis and anterolisthesis at L4/L5 and L5/S1. Surgical intervention is under consideration. An MRI of the lumbar spine has been ordered to update imaging for surgical planning, along with a lumbar spine flexion and extension x-ray to assess instability in anterolisthesis. A follow-up with a neurosurgeon is coordinated for surgical evaluation and planning. A follow-up appointment is offered to discuss results and explore alternative treatments if surgery is not pursued.  Abnormal MRI of lumbar spine   A previous MRI from February 2025 showed left neuroforaminal stenosis at L5, right neuroforaminal stenosis at L4-5, and anterolisthesis at L4/L5 and L5/S1. Updated imaging is necessary to assess the current status and guide surgical planning. An updated MRI of the lumbar spine has been ordered to evaluate the current condition of spondylolisthesis and stenosis.       Jose Washington has a current medication list which includes the following long-term medication(s): atenolol , atorvastatin , baclofen , calcium  carbonate, diphenhydramine hcl, duloxetine , fluticasone propionate (inhal), levalbuterol , omeprazole , potassium chloride er, simethicone , and sucralfate .  Pharmacotherapy (Medications Ordered): No orders of the defined types were placed in this encounter.  Orders:  Orders Placed This Encounter  Procedures   MR LUMBAR SPINE WO CONTRAST    Patient presents with axial pain with possible radicular component. Please assist us  in identifying specific level(s) and laterality of any additional findings such  as: 1. Facet (Zygapophyseal) joint DJD (Hypertrophy, space narrowing, subchondral sclerosis, and/or osteophyte formation) 2. DDD and/or IVDD (Loss of disc height, desiccation, gas patterns, osteophytes, endplate sclerosis, or Black disc disease) 3. Pars defects 4. Spondylolisthesis, spondylosis, and/or spondyloarthropathies (include Degree/Grade of  displacement in mm) (stability) 5. Vertebral body Fractures (acute/chronic) (state percentage of collapse) 6. Demineralization (osteopenia/osteoporotic) 7. Bone pathology 8. Foraminal narrowing  9. Surgical changes 10. Central, Lateral Recess, and/or Foraminal Stenosis (include AP diameter of stenosis in mm) 11. Surgical changes (hardware type, status, and presence of fibrosis) 12. Modic Type Changes (MRI only) 13. IVDD (Disc bulge, protrusion, herniation, extrusion) (Level, laterality, extent)    Standing Status:   Future    Expiration Date:   03/04/2025    Scheduling Instructions:     Please make sure that the patient understands that this needs to be done as soon as possible. Never have the patient do the imaging just before the next appointment. Inform patient that having the imaging done within the [facility] will expedite the availability of the results and will provide      imaging availability to the requesting physician. In addition inform the patient that the imaging order has an expiration date and will not be renewed if not done within the active period.    What is the patient's sedation requirement?:   No Sedation    Does the patient have a pacemaker or implanted devices?:   No    Preferred imaging location?:   [facility] (table limit-350lbs)    Call Results- Best Contact Number?:   [phone] [place] [facility]    Radiology Contrast Protocol - do NOT remove file path:   [email or web address]   DG Lumbar Spine Complete W/Bend    Patient presents with axial pain with possible radicular component. Please assist us  in identifying specific level(s) and laterality of any additional findings such as: 1. Facet (Zygapophyseal) joint DJD (Hypertrophy, space narrowing, subchondral sclerosis, and/or osteophyte formation) 2. DDD and/or IVDD (Loss of disc height, desiccation, gas patterns, osteophytes, endplate  sclerosis, or Black disc disease) 3. Pars defects 4. Spondylolisthesis, spondylosis, and/or spondyloarthropathies (include Degree/Grade of displacement in mm) (stability) 5. Vertebral body Fractures (acute/chronic) (state percentage of collapse) 6. Demineralization (osteopenia/osteoporotic) 7. Bone pathology 8. Foraminal narrowing  9. Surgical changes    Standing Status:   Future    Number of Occurrences:   1    Expiration Date:   03/04/2025    Scheduling Instructions:     Please make sure that the patient understands that this needs to be done as soon as possible. Never have the patient do the imaging just before the next appointment. Inform patient that having the imaging done within the [facility] will expedite the availability of the results and will provide      imaging availability to the requesting physician. In addition inform the patient that the imaging order has an expiration date and will not be renewed if not done within the active period.    Reason for Exam (SYMPTOM  OR DIAGNOSIS REQUIRED):   Low back pain    Preferred imaging location?:   [facility]    Call Results- Best Contact Number?:   [phone] [place] [facility] at [facility]    Radiology Contrast Protocol - do NOT remove file path:   [email or web address]    Release to patient:   Immediate     Interventional Therapies  Risk Factors  Considerations:  Abnormal UDS (03/27/2020) & (08/20/20) (+) undisclosed carboxy-THC    Planned  Pending:   Neurosurgery has requested a more recent MRI prior to undergoing any surgical intervention.  Today we are entering an order for that lumbar MRI (12/05/2024)   Under consideration:   Therapeutic left L4 & L5 transforaminal ESI #1  Diagnostic left lumbar facet MBB #2 with possible RFA follow-up  Diagnostic left lumbar spinal cord stimulator trial #1    Completed:   Therapeutic Right trapezius,  right longissimus Thoracics muscle, & right erector spinae muscle trigger point injections (03/19/2024)  Diagnostic/therapeutic right IA hip inj. x1 (12/09/2020) (100/100/95/90-95)  Therapeutic right lumbar facet RFA x3 (07/17/2024) (100/100/100/100) (low back pain is gone)  Palliative right lumbar facet MBB x2 (06/07/2019) (100/100/100 x20 days/>75 x3 months)  Diagnostic/therapeutic left lumbar facet MBB x1 (06/07/2019) (100/100/100 x20 days/>75 x3 months)  Diagnostic/therapeutic left L4-5 LESI x2 (07/25/2018) (n/a)  Diagnostic/therapeutic left L5-S1 LESI x2 (12/27/2023, 10/18/2024) (100/100/100 x 5 days/0)  Palliative left CESI x3 (02/16/2018) (100/100/100/100)  Diagnostic right cervical facet MBB x2 (08/19/2022) (100/100/100/100) (w/ local anesthetic only)  Diagnostic left cervical facet MBB x1 (01/10/2020) (100/100/100)  Therapeutic right cervical facet RFA x1 (04/12/2023) (100/100/90/90)  Diagnostic/therapeutic right shoulder region TPI/MNB x2 (07/19/2022) (100/100/100/100)  Referral to physical therapy x3 (last one on 08/29/2024)  Referral to neurosurgery (11/28/2024)    Therapeutic  Palliative (PRN) options:   Therapeutic lumbar facet RFA     Pharmacotherapy  Nonopioids transferred 08/20/2020: Baclofen       Return if symptoms worsen or fail to improve.    Recent Visits Date Type Provider Dept  11/28/24 Office Visit Tanya Glisson, MD [facility]  10/18/24 Procedure visit Tanya Glisson, MD [facility]  10/10/24 Office Visit Tanya Glisson, MD [facility]  Showing recent visits within past 90 days and meeting all other requirements Today's Visits Date Type Provider Dept  12/05/24 Office Visit Tanya Glisson, MD [facility]  Showing today's visits and meeting all other requirements Future Appointments No visits were found meeting these conditions. Showing future appointments within next 90 days and meeting all other  requirements  I discussed the assessment and treatment plan with the patient. The patient was provided an opportunity to ask questions and all were answered. The patient agreed with the plan and demonstrated an understanding of the instructions.  Patient advised to call back or seek an in-person evaluation if the symptoms or condition worsens.  Duration of encounter: 30 minutes.  Total time on encounter, as per AMA guidelines included both the face-to-face and non-face-to-face time personally spent by the physician and/or other qualified health care professional(s) on the day of the encounter (includes time in activities that require the physician or other qualified health care professional and does not include time in activities normally performed by clinical staff). Physician's time may include the following activities when performed: Preparing to see the patient (e.g., pre-charting review of records, searching for previously ordered imaging, lab work, and nerve conduction tests) Review of prior analgesic pharmacotherapies. Reviewing PMP Interpreting ordered tests (e.g., lab work, imaging, nerve conduction tests) Performing post-procedure evaluations, including interpretation of diagnostic procedures Obtaining and/or reviewing separately obtained history Performing a medically appropriate examination and/or evaluation Counseling and educating the patient/family/caregiver Ordering medications, tests, or procedures Referring and communicating with other health care professionals (when not separately reported) Documenting clinical information in the electronic or other health record Independently interpreting results (not separately reported) and communicating results to the patient/ family/caregiver Care coordination (not  separately reported)  Note by: Eric DELENA Como, MD (TTS and AI technology used. I apologize for any typographical errors that were not detected and corrected.) Date:  12/05/2024; Time: 10:08 AM

## 2024-12-05 NOTE — Patient Instructions (Signed)
 " ______________________________________________________________________    Procedure instructions  Stop blood-thinners  Do not eat or drink fluids (other than water) for 8 hours before your procedure  No water for 2 hours before your procedure  Take your blood pressure medicine with a sip of water  Arrive 30 minutes before your appointment  If sedation is planned, bring suitable driver. [place], [place], & public transportation are NOT APPROVED)  Carefully read the Preparing for your procedure detailed instructions  If you have questions call us  at [phone]  Procedure appointments are for procedures only.   NO medication refills or new problem evaluations will be done on procedure days.   Only the scheduled, pre-approved procedure and side will be done.   ______________________________________________________________________     ______________________________________________________________________    Preparing for your procedure  Appointments: If you think you may not be able to keep your appointment, call 24-48 hours in advance to cancel. We need time to make it available to others.  Procedure visits are for procedures only. During your procedure appointment there will be: NO Prescription Refills*. NO medication changes or discussions*. NO discussion of disability issues*. NO unrelated pain problem evaluations*. NO evaluations to order other pain procedures*. *These will be addressed at a separate and distinct evaluation encounter on the provider's evaluation schedule and not during procedure days.  Instructions: Food intake: Avoid eating anything solid for at least 8 hours prior to your procedure. Clear liquid intake: You may take clear liquids such as water up to 2 hours prior to your procedure. (No carbonated drinks. No soda.) Transportation: Unless otherwise stated by your physician, bring a driver. (Driver cannot be a Market Researcher, Pharmacist, Community, or any other form of public  transportation.) Morning Medicines: Except for blood thinners, take all of your other morning medications with a sip of water. Make sure to take your heart and blood pressure medicines. If your blood pressure's lower number is above 100, the case will be rescheduled. Blood thinners: Make sure to stop your blood thinners as instructed.  If you take a blood thinner, but were not instructed to stop it, call our office [phone] and ask to talk to a nurse. Not stopping a blood thinner prior to certain procedures could lead to serious complications. Diabetics on insulin: Notify the staff so that you can be scheduled 1st case in the morning. If your diabetes requires high dose insulin, take only  of your normal insulin dose the morning of the procedure and notify the staff that you have done so. Preventing infections: Shower with an antibacterial soap the morning of your procedure.  Build-up your immune system: Take 1000 mg of Vitamin C with every meal (3 times a day) the day prior to your procedure. Antibiotics: Inform the nursing staff if you are taking any antibiotics or if you have any conditions that may require antibiotics prior to procedures. (Example: recent joint implants)   Pregnancy: If you are pregnant make sure to notify the nursing staff. Not doing so may result in injury to the fetus, including death.  Sickness: If you have a cold, fever, or any active infections, call and cancel or reschedule your procedure. Receiving steroids while having an infection may result in complications. Arrival: You must be in the facility at least 30 minutes prior to your scheduled procedure. Tardiness: Your scheduled time is also the cutoff time. If you do not arrive at least 15 minutes prior to your procedure, you will be rescheduled.  Children: Do not bring any children  with you. Make arrangements to keep them home. Dress appropriately: There is always a possibility that your clothing may get soiled. Avoid  long dresses. Valuables: Do not bring any jewelry or valuables.  Reasons to call and reschedule or cancel your procedure: (Following these recommendations will minimize the risk of a serious complication.) Surgeries: Avoid having procedures within 2 weeks of any surgery. (Avoid for 2 weeks before or after any surgery). Flu Shots: Avoid having procedures within 2 weeks of a flu shots or . (Avoid for 2 weeks before or after immunizations). Barium: Avoid having a procedure within 7-10 days after having had a radiological study involving the use of radiological contrast. (Myelograms, Barium swallow or enema study). Heart attacks: Avoid any elective procedures or surgeries for the initial 6 months after a Myocardial Infarction (Heart Attack). Blood thinners: It is imperative that you stop these medications before procedures. Let us  know if you if you take any blood thinner.  Infection: Avoid procedures during or within two weeks of an infection (including chest colds or gastrointestinal problems). Symptoms associated with infections include: Localized redness, fever, chills, night sweats or profuse sweating, burning sensation when voiding, cough, congestion, stuffiness, runny nose, sore throat, diarrhea, nausea, vomiting, cold or Flu symptoms, recent or current infections. It is specially important if the infection is over the area that we intend to treat. Heart and lung problems: Symptoms that may suggest an active cardiopulmonary problem include: cough, chest pain, breathing difficulties or shortness of breath, dizziness, ankle swelling, uncontrolled high or unusually low blood pressure, and/or palpitations. If you are experiencing any of these symptoms, cancel your procedure and contact your primary care physician for an evaluation.  Remember:  Regular Business hours are:  Monday to Thursday 8:00 AM to 4:00 PM  Provider's Schedule: Eric Como, MD:  Procedure days: Tuesday and Thursday 7:30  AM to 4:00 PM  Wallie Sherry, MD:  Procedure days: Monday and Wednesday 7:30 AM to 4:00 PM Last  Updated: 10/11/2023 ______________________________________________________________________     ______________________________________________________________________    General Risks and Possible Complications  Patient Responsibilities: It is important that you read this as it is part of your informed consent. It is our duty to inform you of the risks and possible complications associated with treatments offered to you. It is your responsibility as a patient to read this and to ask questions about anything that is not clear or that you believe was not covered in this document.  Patients Rights: You have the right to refuse treatment. You also have the right to change your mind, even after initially having agreed to have the treatment done. However, under this last option, if you wait until the last second to change your mind, you may be charged for the materials used up to that point.  Introduction: Medicine is not an visual merchandiser. Everything in Medicine, including the lack of treatment(s), carries the potential for danger, harm, or loss (which is by definition: Risk). In Medicine, a complication is a secondary problem, condition, or disease that can aggravate an already existing one. All treatments carry the risk of possible complications. The fact that a side effects or complications occurs, does not imply that the treatment was conducted incorrectly. It must be clearly understood that these can happen even when everything is done following the highest safety standards.  No treatment: You can choose not to proceed with the proposed treatment alternative. The PRO(s) would include: avoiding the risk of complications associated with the therapy. The CON(s) would  include: not getting any of the treatment benefits. These benefits fall under one of three categories: diagnostic; therapeutic; and/or  palliative. Diagnostic benefits include: getting information which can ultimately lead to improvement of the disease or symptom(s). Therapeutic benefits are those associated with the successful treatment of the disease. Finally, palliative benefits are those related to the decrease of the primary symptoms, without necessarily curing the condition (example: decreasing the pain from a flare-up of a chronic condition, such as incurable terminal cancer).  General Risks and Complications: These are associated to most interventional treatments. They can occur alone, or in combination. They fall under one of the following six (6) categories: no benefit or worsening of symptoms; bleeding; infection; nerve damage; allergic reactions; and/or death. No benefits or worsening of symptoms: In Medicine there are no guarantees, only probabilities. No healthcare provider can ever guarantee that a medical treatment will work, they can only state the probability that it may. Furthermore, there is always the possibility that the condition may worsen, either directly, or indirectly, as a consequence of the treatment. Bleeding: This is more common if the patient is taking a blood thinner, either prescription or over the counter (example: Goody Powders, Fish oil, Aspirin, Garlic, etc.), or if suffering a condition associated with impaired coagulation (example: Hemophilia, cirrhosis of the liver, low platelet counts, etc.). However, even if you do not have one on these, it can still happen. If you have any of these conditions, or take one of these drugs, make sure to notify your treating physician. Infection: This is more common in patients with a compromised immune system, either due to disease (example: diabetes, cancer, human immunodeficiency virus [HIV], etc.), or due to medications or treatments (example: therapies used to treat cancer and rheumatological diseases). However, even if you do not have one on these, it can still  happen. If you have any of these conditions, or take one of these drugs, make sure to notify your treating physician. Nerve Damage: This is more common when the treatment is an invasive one, but it can also happen with the use of medications, such as those used in the treatment of cancer. The damage can occur to small secondary nerves, or to large primary ones, such as those in the spinal cord and brain. This damage may be temporary or permanent and it may lead to impairments that can range from temporary numbness to permanent paralysis and/or brain death. Allergic Reactions: Any time a substance or material comes in contact with our body, there is the possibility of an allergic reaction. These can range from a mild skin rash (contact dermatitis) to a severe systemic reaction (anaphylactic reaction), which can result in death. Death: In general, any medical intervention can result in death, most of the time due to an unforeseen complication. ______________________________________________________________________      ______________________________________________________________________    Steroid injections  Common steroids for injections Triamcinolone : Used by many sports medicine physicians for large joint and bursal injections, often combined with a local anesthetic like lidocaine . A study focusing on coccydynia (tailbone pain) found triamcinolone  was more effective than betamethasone, suggesting it may also be preferable for other localized inflammation conditions. Methylprednisolone : A common alternative to triamcinolone  that is also a strong anti-inflammatory. It is available in different formulations, with the acetate suspension being the long-acting option for intra-articular injections. Dexamethasone : This is a non-particulate steroid, meaning it has a lower risk of tissue damage compared to particulate steroids like triamcinolone  and methylprednisolone . While less common for this specific  use, it is an option for targeted injections.   Considerations for physicians Particulate vs. non-particulate steroids: Triamcinolone  and methylprednisolone  are particulate, meaning they can clump together. Dexamethasone  is non-particulate. Particulate steroids are often preferred for their longer-lasting effects but carry a theoretical higher risk for certain injections (though this is less of a concern in the costochondral joints). Combined injectate: Corticosteroids are typically mixed with a local anesthetic like lidocaine  to provide both immediate pain relief (from the anesthetic) and longer-term inflammation reduction (from the steroid). Imaging guidance: To ensure accurate placement of the needle and medication, physicians may use ultrasound or fluoroscopic guidance for the injection, especially in complex or refractory cases.   Patient guidance Before undergoing a steroid injection, discuss the options with your physician. They will determine the best steroid, dosage, and procedure for your specific case based on factors like: Severity of your condition History of response to other treatments Your overall health status Experience and preference of the physician  Last  Updated: 06/26/2024 ______________________________________________________________________      ______________________________________________________________________  Spondylolisthesis  Spondylolisthesis is a condition that occurs when a vertebra in the spine slips out of place, usually in the lower back. Symptoms can vary from mild to severe, and a person may have no symptoms.  Some common symptoms include:  Back pain, especially chronic pain  Pain that radiates down the legs  Pain that worsens with exercise  Tightness in the hamstrings  Neck stiffness  Loss of spine flexibility  Weakness in the legs or trouble walking  Numbness and tingling in the groin and/or buttocks   Some causes of spondylolisthesis  include: Birth defects. Sudden injury. Abnormal wear on the cartilage and bones, such as arthritis. Bone disease and fractures. Certain sports activities, such as gymnastics, weightlifting, and football.  A doctor can diagnose spondylolisthesis with a physical exam, X-rays, and possibly a CT scan.    Forward slippage is known as Anterolisthesis.  Backward slippage is known as Retrolisthesis.   Pathophysiology of Spondylolisthesis:   Grading Classification of Spondylolisthesis Grade I spondylolisthesis is 1 to 25% slippage, grade II is up to 50% slippage, grade III is up to 75% slippage, and grade IV is 76-100% slippage. If there is more than 100% slippage, it is known as spondyloptosis or grade V spondylolisthesis.       ______________________________________________________________________    "

## 2024-12-13 ENCOUNTER — Ambulatory Visit

## 2025-04-15 ENCOUNTER — Encounter: Admitting: Nurse Practitioner
